# Patient Record
Sex: Female | Born: 1955 | Race: White | Hispanic: No | Marital: Married | Smoking: Never smoker
Health system: Southern US, Community
[De-identification: ages and names within clinical notes are randomized; demographics above are authoritative.]

## PROBLEM LIST (undated history)

## (undated) DIAGNOSIS — G4733 Obstructive sleep apnea (adult) (pediatric): Secondary | ICD-10-CM

## (undated) DIAGNOSIS — Z95810 Presence of automatic (implantable) cardiac defibrillator: Secondary | ICD-10-CM

## (undated) DIAGNOSIS — E785 Hyperlipidemia, unspecified: Secondary | ICD-10-CM

## (undated) DIAGNOSIS — I4729 Other ventricular tachycardia: Secondary | ICD-10-CM

## (undated) DIAGNOSIS — I509 Heart failure, unspecified: Secondary | ICD-10-CM

## (undated) DIAGNOSIS — T82198A Other mechanical complication of other cardiac electronic device, initial encounter: Secondary | ICD-10-CM

## (undated) DIAGNOSIS — N189 Chronic kidney disease, unspecified: Secondary | ICD-10-CM

## (undated) DIAGNOSIS — K219 Gastro-esophageal reflux disease without esophagitis: Secondary | ICD-10-CM

## (undated) DIAGNOSIS — G4700 Insomnia, unspecified: Secondary | ICD-10-CM

## (undated) DIAGNOSIS — I472 Ventricular tachycardia: Secondary | ICD-10-CM

## (undated) DIAGNOSIS — J189 Pneumonia, unspecified organism: Secondary | ICD-10-CM

## (undated) DIAGNOSIS — Z8619 Personal history of other infectious and parasitic diseases: Secondary | ICD-10-CM

## (undated) DIAGNOSIS — T8859XA Other complications of anesthesia, initial encounter: Secondary | ICD-10-CM

## (undated) DIAGNOSIS — IMO0001 Reserved for inherently not codable concepts without codable children: Secondary | ICD-10-CM

## (undated) DIAGNOSIS — Z950 Presence of cardiac pacemaker: Secondary | ICD-10-CM

## (undated) DIAGNOSIS — M109 Gout, unspecified: Secondary | ICD-10-CM

## (undated) DIAGNOSIS — Z8709 Personal history of other diseases of the respiratory system: Secondary | ICD-10-CM

## (undated) DIAGNOSIS — I428 Other cardiomyopathies: Secondary | ICD-10-CM

## (undated) DIAGNOSIS — C50919 Malignant neoplasm of unspecified site of unspecified female breast: Secondary | ICD-10-CM

## (undated) DIAGNOSIS — E669 Obesity, unspecified: Secondary | ICD-10-CM

## (undated) HISTORY — DX: Other mechanical complication of other cardiac electronic device, initial encounter: T82.198A

## (undated) HISTORY — DX: Heart failure, unspecified: I50.9

## (undated) HISTORY — DX: Obstructive sleep apnea (adult) (pediatric): G47.33

## (undated) HISTORY — DX: Other cardiomyopathies: I42.8

## (undated) HISTORY — PX: COLONOSCOPY: SHX174

## (undated) HISTORY — DX: Obesity, unspecified: E66.9

## (undated) HISTORY — PX: ABDOMINAL HYSTERECTOMY: SHX81

## (undated) HISTORY — DX: Other ventricular tachycardia: I47.29

## (undated) HISTORY — DX: Malignant neoplasm of unspecified site of unspecified female breast: C50.919

## (undated) HISTORY — DX: Presence of automatic (implantable) cardiac defibrillator: Z95.810

## (undated) HISTORY — DX: Ventricular tachycardia: I47.2

## (undated) HISTORY — PX: OTHER SURGICAL HISTORY: SHX169

---

## 1976-09-07 HISTORY — PX: APPENDECTOMY: SHX54

## 1996-09-07 DIAGNOSIS — C50919 Malignant neoplasm of unspecified site of unspecified female breast: Secondary | ICD-10-CM

## 1996-09-07 HISTORY — DX: Malignant neoplasm of unspecified site of unspecified female breast: C50.919

## 1996-09-07 HISTORY — PX: MASTECTOMY: SHX3

## 1996-09-07 HISTORY — PX: OTHER SURGICAL HISTORY: SHX169

## 2005-09-07 HISTORY — PX: OTHER SURGICAL HISTORY: SHX169

## 2005-10-22 ENCOUNTER — Ambulatory Visit: Payer: Self-pay | Admitting: Cardiology

## 2005-11-25 ENCOUNTER — Ambulatory Visit: Payer: Self-pay | Admitting: Cardiology

## 2005-11-30 ENCOUNTER — Ambulatory Visit: Payer: Self-pay | Admitting: Cardiology

## 2006-01-21 ENCOUNTER — Ambulatory Visit: Payer: Self-pay | Admitting: Cardiology

## 2006-03-04 ENCOUNTER — Ambulatory Visit: Payer: Self-pay | Admitting: Cardiology

## 2006-03-17 ENCOUNTER — Ambulatory Visit: Payer: Self-pay | Admitting: Cardiology

## 2006-04-07 ENCOUNTER — Ambulatory Visit: Payer: Self-pay | Admitting: Internal Medicine

## 2006-04-27 ENCOUNTER — Inpatient Hospital Stay (HOSPITAL_COMMUNITY): Admission: RE | Admit: 2006-04-27 | Discharge: 2006-04-29 | Payer: Self-pay | Admitting: Internal Medicine

## 2006-04-27 ENCOUNTER — Ambulatory Visit: Payer: Self-pay | Admitting: Internal Medicine

## 2006-05-17 ENCOUNTER — Ambulatory Visit: Payer: Self-pay | Admitting: Internal Medicine

## 2006-05-17 ENCOUNTER — Ambulatory Visit: Payer: Self-pay

## 2006-05-31 ENCOUNTER — Ambulatory Visit: Payer: Self-pay | Admitting: Cardiology

## 2006-08-17 ENCOUNTER — Ambulatory Visit: Payer: Self-pay | Admitting: Internal Medicine

## 2006-09-14 ENCOUNTER — Ambulatory Visit: Payer: Self-pay | Admitting: Cardiology

## 2006-11-16 ENCOUNTER — Ambulatory Visit: Payer: Self-pay | Admitting: Internal Medicine

## 2007-01-11 ENCOUNTER — Ambulatory Visit: Payer: Self-pay | Admitting: Cardiology

## 2007-02-11 ENCOUNTER — Ambulatory Visit: Payer: Self-pay | Admitting: Cardiology

## 2007-02-15 ENCOUNTER — Ambulatory Visit: Payer: Self-pay | Admitting: Internal Medicine

## 2007-02-17 ENCOUNTER — Ambulatory Visit: Payer: Self-pay | Admitting: Internal Medicine

## 2007-05-17 ENCOUNTER — Ambulatory Visit: Payer: Self-pay | Admitting: Internal Medicine

## 2007-06-16 ENCOUNTER — Ambulatory Visit: Payer: Self-pay | Admitting: Internal Medicine

## 2007-08-10 ENCOUNTER — Ambulatory Visit: Payer: Self-pay | Admitting: Internal Medicine

## 2007-10-14 ENCOUNTER — Ambulatory Visit: Payer: Self-pay | Admitting: Cardiology

## 2007-11-08 ENCOUNTER — Ambulatory Visit: Payer: Self-pay | Admitting: Internal Medicine

## 2008-02-13 ENCOUNTER — Ambulatory Visit: Payer: Self-pay | Admitting: Internal Medicine

## 2008-05-15 ENCOUNTER — Ambulatory Visit: Payer: Self-pay | Admitting: Internal Medicine

## 2008-08-14 ENCOUNTER — Ambulatory Visit: Payer: Self-pay | Admitting: Internal Medicine

## 2008-08-22 ENCOUNTER — Ambulatory Visit: Payer: Self-pay | Admitting: Cardiology

## 2008-08-27 ENCOUNTER — Ambulatory Visit: Payer: Self-pay | Admitting: Internal Medicine

## 2008-08-27 ENCOUNTER — Ambulatory Visit: Payer: Self-pay

## 2008-08-27 ENCOUNTER — Encounter: Payer: Self-pay | Admitting: Internal Medicine

## 2008-11-13 ENCOUNTER — Ambulatory Visit: Payer: Self-pay | Admitting: Internal Medicine

## 2008-11-16 ENCOUNTER — Encounter: Payer: Self-pay | Admitting: Internal Medicine

## 2008-12-21 ENCOUNTER — Telehealth: Payer: Self-pay | Admitting: Cardiology

## 2009-01-15 ENCOUNTER — Ambulatory Visit: Payer: Self-pay | Admitting: Internal Medicine

## 2009-01-15 DIAGNOSIS — I5022 Chronic systolic (congestive) heart failure: Secondary | ICD-10-CM | POA: Insufficient documentation

## 2009-01-15 DIAGNOSIS — R0602 Shortness of breath: Secondary | ICD-10-CM

## 2009-01-29 LAB — CONVERTED CEMR LAB
ALT: 17 units/L (ref 0–35)
AST: 23 units/L (ref 0–37)
Alkaline Phosphatase: 111 units/L (ref 39–117)
Basophils Absolute: 0 10*3/uL (ref 0.0–0.1)
Bilirubin, Direct: 0.1 mg/dL (ref 0.0–0.3)
CO2: 32 meq/L (ref 19–32)
Calcium: 9.3 mg/dL (ref 8.4–10.5)
Chloride: 107 meq/L (ref 96–112)
Direct LDL: 147.2 mg/dL
HDL: 31.4 mg/dL — ABNORMAL LOW (ref >39.00)
Lymphocytes Relative: 29.6 % (ref 12.0–46.0)
Monocytes Relative: 6.5 % (ref 3.0–12.0)
Neutrophils Relative %: 61.4 % (ref 43.0–77.0)
Platelets: 194 10*3/uL (ref 150.0–400.0)
Potassium: 4.3 meq/L (ref 3.5–5.1)
Pro B Natriuretic peptide (BNP): 234 pg/mL — ABNORMAL HIGH (ref 0.0–100.0)
RDW: 13 % (ref 11.5–14.6)
Sodium: 143 meq/L (ref 135–145)
TSH: 1.61 microintl units/mL (ref 0.35–5.50)
Total Protein: 7 g/dL (ref 6.0–8.3)
WBC: 6.2 10*3/uL (ref 4.5–10.5)

## 2009-02-01 ENCOUNTER — Ambulatory Visit: Payer: Self-pay | Admitting: Internal Medicine

## 2009-02-01 ENCOUNTER — Ambulatory Visit (HOSPITAL_COMMUNITY): Admission: RE | Admit: 2009-02-01 | Discharge: 2009-02-01 | Payer: Self-pay | Admitting: Internal Medicine

## 2009-02-05 ENCOUNTER — Ambulatory Visit: Payer: Self-pay | Admitting: Internal Medicine

## 2009-02-12 ENCOUNTER — Ambulatory Visit: Payer: Self-pay | Admitting: Internal Medicine

## 2009-02-20 ENCOUNTER — Encounter: Payer: Self-pay | Admitting: Internal Medicine

## 2009-02-26 ENCOUNTER — Telehealth (INDEPENDENT_AMBULATORY_CARE_PROVIDER_SITE_OTHER): Payer: Self-pay | Admitting: *Deleted

## 2009-03-26 ENCOUNTER — Encounter: Payer: Self-pay | Admitting: Cardiology

## 2009-04-02 ENCOUNTER — Encounter: Payer: Self-pay | Admitting: Internal Medicine

## 2009-04-02 ENCOUNTER — Ambulatory Visit: Payer: Self-pay | Admitting: Internal Medicine

## 2009-04-02 DIAGNOSIS — E663 Overweight: Secondary | ICD-10-CM | POA: Insufficient documentation

## 2009-04-08 ENCOUNTER — Encounter: Payer: Self-pay | Admitting: Internal Medicine

## 2009-04-10 ENCOUNTER — Ambulatory Visit: Payer: Self-pay

## 2009-04-10 ENCOUNTER — Encounter: Payer: Self-pay | Admitting: Internal Medicine

## 2009-04-12 ENCOUNTER — Telehealth: Payer: Self-pay | Admitting: Internal Medicine

## 2009-05-08 ENCOUNTER — Encounter: Payer: Self-pay | Admitting: Internal Medicine

## 2009-05-14 ENCOUNTER — Ambulatory Visit: Payer: Self-pay | Admitting: Internal Medicine

## 2009-05-30 ENCOUNTER — Encounter: Payer: Self-pay | Admitting: Internal Medicine

## 2009-07-08 ENCOUNTER — Telehealth: Payer: Self-pay | Admitting: Internal Medicine

## 2009-07-29 ENCOUNTER — Ambulatory Visit: Payer: Self-pay | Admitting: Internal Medicine

## 2009-07-29 ENCOUNTER — Encounter: Payer: Self-pay | Admitting: Internal Medicine

## 2009-07-30 ENCOUNTER — Encounter (INDEPENDENT_AMBULATORY_CARE_PROVIDER_SITE_OTHER): Payer: Self-pay | Admitting: *Deleted

## 2009-07-31 LAB — CONVERTED CEMR LAB
BUN: 12 mg/dL (ref 6–23)
Basophils Absolute: 0.1 10*3/uL (ref 0.0–0.1)
GFR calc non Af Amer: 79 mL/min (ref >60)
Hemoglobin: 12.9 g/dL (ref 12.0–15.0)
Lymphocytes Relative: 17.6 % (ref 12.0–46.0)
Monocytes Relative: 5.5 % (ref 3.0–12.0)
Platelets: 248 10*3/uL (ref 150.0–400.0)
Potassium: 3.5 meq/L (ref 3.5–5.1)
Pro B Natriuretic peptide (BNP): 47 pg/mL (ref 0.0–100.0)
Prothrombin Time: 10.9 s (ref 9.1–11.7)
RDW: 12.9 % (ref 11.5–14.6)
aPTT: 25.7 s (ref 21.7–28.8)

## 2009-08-03 ENCOUNTER — Encounter: Payer: Self-pay | Admitting: Internal Medicine

## 2009-08-06 ENCOUNTER — Telehealth: Payer: Self-pay | Admitting: Internal Medicine

## 2009-08-09 ENCOUNTER — Ambulatory Visit: Payer: Self-pay | Admitting: Internal Medicine

## 2009-08-09 ENCOUNTER — Inpatient Hospital Stay (HOSPITAL_BASED_OUTPATIENT_CLINIC_OR_DEPARTMENT_OTHER): Admission: RE | Admit: 2009-08-09 | Discharge: 2009-08-09 | Payer: Self-pay | Admitting: Internal Medicine

## 2009-08-13 ENCOUNTER — Ambulatory Visit: Payer: Self-pay | Admitting: Internal Medicine

## 2009-08-26 ENCOUNTER — Telehealth: Payer: Self-pay | Admitting: Internal Medicine

## 2009-08-29 ENCOUNTER — Encounter: Payer: Self-pay | Admitting: Internal Medicine

## 2009-09-20 ENCOUNTER — Ambulatory Visit: Payer: Self-pay | Admitting: Internal Medicine

## 2009-11-12 ENCOUNTER — Ambulatory Visit: Payer: Self-pay | Admitting: Internal Medicine

## 2009-11-19 ENCOUNTER — Encounter: Payer: Self-pay | Admitting: Internal Medicine

## 2009-12-12 ENCOUNTER — Encounter: Payer: Self-pay | Admitting: Internal Medicine

## 2010-01-22 ENCOUNTER — Ambulatory Visit: Payer: Self-pay

## 2010-01-22 ENCOUNTER — Ambulatory Visit: Payer: Self-pay | Admitting: Internal Medicine

## 2010-01-22 ENCOUNTER — Encounter: Payer: Self-pay | Admitting: Internal Medicine

## 2010-01-22 ENCOUNTER — Ambulatory Visit: Payer: Self-pay | Admitting: Cardiology

## 2010-01-22 ENCOUNTER — Ambulatory Visit (HOSPITAL_COMMUNITY): Admission: RE | Admit: 2010-01-22 | Discharge: 2010-01-22 | Payer: Self-pay | Admitting: Internal Medicine

## 2010-01-23 ENCOUNTER — Encounter: Payer: Self-pay | Admitting: Internal Medicine

## 2010-01-23 LAB — CONVERTED CEMR LAB
AST: 31 units/L (ref 0–37)
Albumin: 3.6 g/dL (ref 3.5–5.2)
Alkaline Phosphatase: 109 units/L (ref 39–117)
Calcium: 9.5 mg/dL (ref 8.4–10.5)
Cholesterol: 128 mg/dL (ref 0–200)
GFR calc non Af Amer: 75.58 mL/min (ref >60)
Sodium: 141 meq/L (ref 135–145)
Total Protein: 7.3 g/dL (ref 6.0–8.3)
Triglycerides: 141 mg/dL (ref 0.0–149.0)

## 2010-02-11 ENCOUNTER — Ambulatory Visit: Payer: Self-pay | Admitting: Internal Medicine

## 2010-02-25 ENCOUNTER — Telehealth: Payer: Self-pay | Admitting: Internal Medicine

## 2010-03-13 ENCOUNTER — Encounter: Payer: Self-pay | Admitting: Internal Medicine

## 2010-05-16 ENCOUNTER — Encounter: Payer: Self-pay | Admitting: Internal Medicine

## 2010-05-24 ENCOUNTER — Encounter: Payer: Self-pay | Admitting: Internal Medicine

## 2010-05-26 ENCOUNTER — Ambulatory Visit: Payer: Self-pay | Admitting: Internal Medicine

## 2010-06-09 ENCOUNTER — Telehealth: Payer: Self-pay | Admitting: Internal Medicine

## 2010-06-18 ENCOUNTER — Encounter: Payer: Self-pay | Admitting: Internal Medicine

## 2010-07-28 ENCOUNTER — Ambulatory Visit: Payer: Self-pay | Admitting: Internal Medicine

## 2010-08-12 ENCOUNTER — Ambulatory Visit: Payer: Self-pay | Admitting: Internal Medicine

## 2010-08-12 DIAGNOSIS — I959 Hypotension, unspecified: Secondary | ICD-10-CM

## 2010-10-07 NOTE — Progress Notes (Signed)
Summary: set up for lab work  Phone Note Call from Patient Call back at [facility] Phone [phone] Call back at [phone]   Caller: Patient Reason for Call: Talk to Nurse, Lab or Test Results Summary of Call: pt would like to be set up for lab work.  Initial call taken by: Neil Crouch,  June 09, 2010 10:27 AM  Follow-up for Phone Call        Left message to call back Kevan Rosebush, RN  June 09, 2010 5:56 PM   spoke w/pt she has been monitoring her blood sugar and it is staying around 120 and was 148 this am, these are done fasting when she gets up in the am, she ask if she should have blood work, advised she should f/u w/pcp she is agreeable Kevan Rosebush, RN  June 10, 2010 8:30 AM

## 2010-10-07 NOTE — Letter (Signed)
Summary: [place], [place] [address] [place]   [place], [place] [address]   Phone: [phone]  Fax: [phone]     Jan 23, 2010 MRN: [id number]   [place]  [address]   Dear Ms. Jech,  We have reviewed your cholesterol results.  They are as follows:     Total Cholesterol:    128 (Desirable: less than 200)       HDL  Cholesterol:     38.60  (Desirable: greater than 40 for men and 50 for women)       LDL Cholesterol:       61  (Desirable: less than 100 for low risk and less than 70 for moderate to high risk)       Triglycerides:       141.0  (Desirable: less than 150)  Our recommendations include:  Looks Good, all other labwork looked good as well, please continue current medications and feel free to give [place] a call if you have any questions.   Call our office at the number listed above if you have any questions.  Lowering your LDL cholesterol is important, but it is only one of a large number of "risk factors" that may indicate that you are at risk for heart disease, stroke or other complications of hardening of the arteries.  Other risk factors include:   A.  Cigarette Smoking* B.  High Blood Pressure* C.  Obesity* D.   Low HDL Cholesterol (see yours above)* E.   Diabetes Mellitus (higher risk if your is uncontrolled) F.  Family history of premature heart disease G.  Previous history of stroke or cardiovascular disease    *These are risk factors YOU HAVE CONTROL OVER.  For more information, visit .  There is now evidence that lowering the TOTAL CHOLESTEROL AND LDL CHOLESTEROL can reduce the risk of heart disease.  The American Heart Association recommends the following guidelines for the treatment of elevated cholesterol:  1.  If there is now current heart disease and less than two risk factors, TOTAL CHOLESTEROL should be less than 200 and LDL CHOLESTEROL should be less than 100. 2.  If there  is current heart disease or two or more risk factors, TOTAL CHOLESTEROL should be less than 200 and LDL CHOLESTEROL should be less than 70.  A diet low in cholesterol, saturated fat, and calories is the cornerstone of treatment for elevated cholesterol.  Cessation of smoking and exercise are also important in the management of elevated cholesterol and preventing vascular disease.  Studies have shown that 30 to 60 minutes of physical activity most days can help lower blood pressure, lower cholesterol, and keep your weight at a healthy level.  Drug therapy is used when cholesterol levels do not respond to therapeutic lifestyle changes (smoking cessation, diet, and exercise) and remains unacceptably high.  If medication is started, it is important to have you levels checked periodically to evaluate the need for further treatment options.  Thank you,    [facility]

## 2010-10-07 NOTE — Progress Notes (Signed)
Summary: lab results  Phone Note Call from Patient Call back at Home Phone [phone]   Caller: Patient Reason for Call: Talk to Nurse Summary of Call: request to have lab results faxed to her @ [phone] Initial call taken by: Darnell Level,  February 25, 2010 3:19 PM  Follow-up for Phone Call        labs faxed pt is aware Kevan Rosebush, RN  February 25, 2010 4:43 PM

## 2010-10-07 NOTE — Letter (Signed)
Summary: Remote Device Check  [facility], [place]  [id number] [place]   [place], [place] [address]   Phone: [phone]  Fax: [phone]     March 13, 2010 MRN: [id number]   [place]  [address]   Dear Ms. Janssens,   Your remote transmission was recieved and reviewed by your physician.  All diagnostics were within normal limits for you.  __X___Your next transmission is scheduled for: 05-15-2010.  Please transmit at any time this day.  If you have a wireless device your transmission will be sent automatically.    Sincerely,  Shelly Bombard

## 2010-10-07 NOTE — Cardiovascular Report (Signed)
Summary: Office Visit Remote   Office Visit Remote   Imported By: Sallee Provencal 11/20/2009 11:36:55  _____________________________________________________________________  External Attachment:    Type:   Image     Comment:   External Document

## 2010-10-07 NOTE — Assessment & Plan Note (Signed)
Summary: f80m   Visit Type:  Follow-up Primary Provider:  Laretta Bolster, MD  CC:  shortness of breath.  History of Present Illness: [name] is a 55 y/o woman with history of breast CA in 1998 treated with Adriamycin x 4 cycles. In 2007, diagnosed with CHF with EF 25-35%. She is s/p Medtronic BiV-ICD in 2008 with Sprint Fidelis lead. Never had cath. Nuke study in 2007 normal perfusion by report. Echo 5/11 with EF up to 50-55%. EF by cath 12/10 was 50%.  Had cath 12/10. Coronaries normal. Right atrial pressure mean of 7, RV pressure 28/6 . Pulmonary artery pressure was 23/9 with mean of 15.  Pulmonary capillary wedge pressure mean of 9. Fick cardiac output 3.9 L per minute.  Cardiac index 2.2 L per minute per meter squared.  Pulmonary vascular resistance was 1.5 Woods units.  Returns for routine follow-up. Doing quite well. Walking several days per week. Gets SOB with moderate  exertion. No orthopnea or PND or edema.  Blood sugars running up rcently with HgBA1c 6.2%.   Had f/u mammogram recently on the other breast and told she had 3 suspicious LNs. Waiting 6 months for f/u.    Current Medications (verified): 1)  Cozaar 50 Mg Tabs (Losartan Potassium) .... Take 1 Tablet By Mouth Once A Day 2)  Potassium Chloride Crys Cr 20 Meq Cr-Tabs (Potassium Chloride Crys Cr) .... Take 1 Tablet By Mouth Once A Day 3)  Aldactone 25 Mg Tabs (Spironolactone) .... Take 1 Tablet By Mouth Once A Day 4)  Furosemide 40 Mg Tabs (Furosemide) .... Take 1 1/2 Tablet in Am 1 Tablet in Pm 5)  Prilosec 20 Mg Cpdr (Omeprazole) .... As Needed 6)  Azo-Cranberry 450 Mg Tabs (Cranberry) .... As Needed 7)  Multivitamins   Tabs (Multiple Vitamin) .... Daily 8)  Carvedilol 12.5 Mg Tabs (Carvedilol) .... Take One Tablet By Mouth Twice A Day 9)  Citalopram Hydrobromide 40 Mg Tabs (Citalopram Hydrobromide) .... Daily 10)  Zolpidem Tartrate 10 Mg Tabs (Zolpidem Tartrate) .... Daily 11)  Digoxin 0.125 Mg Tabs (Digoxin) .... Take  1 Tablet By Mouth Once A Day 12)  Crestor 20 Mg Tabs (Rosuvastatin Calcium) .... Take One Tablet By Mouth Daily.  Allergies (verified): No Known Drug Allergies  Past History:  Past Medical History: Last updated: 09/20/2009  1. 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 Coronaries normal. LVEF 50%       --Right atrial pressure mean of 7, RV pressure 28/6 . Pulmonary artery pressure was 23/9 with mean of 15.  Pulmonary capillary wedge pressure mean of 9. Fick cardiac output 3.9 L per minute.  Cardiac index 2.2 L per minute per meter squared.  Pulmonary vascular resistance was 1.5 Woods units.       --s/p BiVICD with Sprint Fidelis lead      --CPX 5/10: VO2 13.9 (corrected for body weight 21.9)  2. Breast Ca      --s/p mastectomy and chemo with adriamycin (1998)  3. Obesity 4. NSVT  Review of Systems       As per HPI and past medical history; otherwise all systems negative.   Vital Signs:  Patient profile:   55 year old female Height:      61 inches Weight:      184 pounds BMI:     34.89 Pulse rate:   73 / minute BP sitting:   106 / 62  (  left arm) Cuff size:   regular  Vitals Entered By: Mignon Pine, RMA (July 28, 2010 2:55 PM)  Physical Exam  General:  The patient was alert and oriented in no acute distress. HEENT Normal.  Neck veins were flat, carotids were brisk.  Lungs were clear.  Heart sounds were regular without murmurs or gallops.  Abdomen was soft with active bowel sounds. There is no clubbing cyanosis or edema. Skin Warm and dry     ICD Specifications Following MD:  Virl Axe, MD     ICD Vendor:  Medtronic     ICD Model Number:  [id number]     ICD Serial Number:  [id number] ICD DOI:  04/27/2006     ICD Implanting MD:  Virl Axe, MD  Lead 1:    Location: RA     DOI: 04/27/2006     Model #: [id number]     Serial #: [id number]     Status: active Lead 2:    Location: RV     DOI: 04/27/2006      Model #: [id number]     Serial #: [id number]     Status: active Lead 3:    Location: LV     DOI: 04/27/2006     Model #: [id number]     Serial #: [id number]     Status: active  Indications::  NICM, CHF   ICD Follow Up ICD Dependent:  No      Episodes Coumadin:  No  Brady Parameters Mode DDDR     Lower Rate Limit:  60     Upper Rate Limit 130 PAV 120     Sensed AV Delay:  80  Tachy Zones VF:  200     VT:  176     Impression & Recommendations:  Problem # 1:  SYSTOLIC HEART FAILURE, CHRONIC (ICD-428.22) Doing very well NYHA Class II with recovered EF. Continue current regimen. Asked her to f/u with [facility]. If she has recurrent disease we will need to coordinate her care to try and reduce risk of recurrent cardiomyopathy   Problem # 2:  Glucose intolerance Continue exercise and weight loss program. Also discussed use of metformin to prevent progression to DM2 (as per [facility] trial). Will f/u with PCP.   Other Orders: EKG w/ Interpretation (93000)  Patient Instructions: 1)  Your physician has requested that you have an echocardiogram.  Echocardiography is a painless test that uses sound waves to create images of your heart. It provides your doctor with information about the size and shape of your heart and how well your heart's chambers and valves are working.  This procedure takes approximately one hour. There are no restrictions for this procedure.  NEEDS IN 6 MONTHS. 2)  Your physician wants you to follow-up in: 6 MONTHS.  You will receive a reminder letter in the mail two months in advance. If you don't receive a letter, please call our office to schedule the follow-up appointment.  Appended Document: refills    Clinical Lists Changes  Medications: Changed medication from COZAAR 50 MG TABS (LOSARTAN POTASSIUM) Take 1 tablet by mouth once a day to LOSARTAN POTASSIUM 50 MG TABS (LOSARTAN POTASSIUM) Take 1 tablet by mouth once a day - Signed Rx of POTASSIUM CHLORIDE CRYS CR 20 MEQ  CR-TABS (POTASSIUM CHLORIDE CRYS CR) Take 1 tablet by mouth once a day;  #90 x 3;  Signed;  Entered by: Kevan Rosebush, RN;  Authorized by: Jolaine Artist, MD, [facility];  Method used: Faxed to [facility], , , [place]  , Ph: [phone], Fax: [phone] Rx of ALDACTONE 25 MG TABS (SPIRONOLACTONE) Take 1 tablet by mouth once a day;  #90 x 3;  Signed;  Entered by: Kevan Rosebush, RN;  Authorized by: Jolaine Artist, MD, [facility];  Method used: Faxed to [facility], , , [place]  , Ph: [phone], Fax: [phone] Rx of FUROSEMIDE 40 MG TABS (FUROSEMIDE) take 1 1/2 tablet in AM 1 tablet in PM;  #225 x 3;  Signed;  Entered by: Kevan Rosebush, RN;  Authorized by: Jolaine Artist, MD, [facility];  Method used: Faxed to [facility], , , [place]  , Ph: [phone], Fax: [phone] Rx of CARVEDILOL 12.5 MG TABS (CARVEDILOL) Take one tablet by mouth twice a day;  #180 x 3;  Signed;  Entered by: Kevan Rosebush, RN;  Authorized by: Jolaine Artist, MD, [facility];  Method used: Faxed to [facility], , , [place]  , Ph: [phone], Fax: [phone] Rx of DIGOXIN 0.125 MG TABS (DIGOXIN) Take 1 tablet by mouth once a day;  #90 x 3;  Signed;  Entered by: Kevan Rosebush, RN;  Authorized by: Jolaine Artist, MD, [facility];  Method used: Faxed to [facility], , , [place]  , Ph: [phone], Fax: [phone] Rx of CRESTOR 20 MG TABS (ROSUVASTATIN CALCIUM) Take one tablet by mouth daily.;  #90 x 3;  Signed;  Entered by: Kevan Rosebush, RN;  Authorized by: Jolaine Artist, MD, [facility];  Method used: Faxed to [facility], , , [place]  , Ph: [phone], Fax: [phone] Rx of LOSARTAN POTASSIUM 50 MG TABS (LOSARTAN POTASSIUM) Take 1 tablet by mouth once a day;  #90 x 3;  Signed;  Entered by: Kevan Rosebush, RN;  Authorized by: Jolaine Artist, MD, [facility];  Method used: Faxed to [facility], , , [place]  , Ph: [phone], Fax: [phone]    Prescriptions: LOSARTAN POTASSIUM 50 MG TABS (LOSARTAN POTASSIUM) Take 1 tablet  by mouth once a day  #90 x 3   Entered by:   Kevan Rosebush, RN   Authorized by:   Jolaine Artist, MD, [facility]   Signed by:   Kevan Rosebush, RN on 07/28/2010   Method used:   Faxed to ...       [facility] (mail-order)             , [place]         Ph: [phone]       Fax: [phone]   RxID:   [id number] CRESTOR 20 MG TABS (ROSUVASTATIN CALCIUM) Take one tablet by mouth daily.  #90 x 3   Entered by:   Kevan Rosebush, RN   Authorized by:   Jolaine Artist, MD, [facility]   Signed by:   Kevan Rosebush, RN on 07/28/2010   Method used:   Faxed to ...       [facility] (mail-order)             , [place]         Ph: [phone]       Fax: [phone]   RxID:   [id number] DIGOXIN 0.125 MG TABS (DIGOXIN) Take 1 tablet by mouth once a day  #90 x 3   Entered by:   Kevan Rosebush, RN   Authorized by:   Jolaine Artist, MD, [facility]   Signed by:   Kevan Rosebush, RN on 07/28/2010   Method used:  Faxed to ...       [facility] (mail-order)             , [place]         Ph: [phone]       Fax: [phone]   RxID:   [id number] CARVEDILOL 12.5 MG TABS (CARVEDILOL) Take one tablet by mouth twice a day  #180 x 3   Entered by:   Kevan Rosebush, RN   Authorized by:   Jolaine Artist, MD, [facility]   Signed by:   Kevan Rosebush, RN on 07/28/2010   Method used:   Faxed to ...       [facility] (mail-order)             , [place]         Ph: [phone]       Fax: [phone]   RxID:   [id number] FUROSEMIDE 40 MG TABS (FUROSEMIDE) take 1 1/2 tablet in AM 1 tablet in PM  #225 x 3   Entered by:   Kevan Rosebush, RN   Authorized by:   Jolaine Artist, MD, [facility]   Signed by:   Kevan Rosebush, RN on 07/28/2010   Method used:   Faxed to ...       [facility] (mail-order)             , [place]         Ph: [phone]       Fax: [phone]   RxID:   [id number] ALDACTONE 25 MG TABS (SPIRONOLACTONE) Take 1 tablet by mouth once a day  #90 x 3   Entered  by:   Kevan Rosebush, RN   Authorized by:   Jolaine Artist, MD, [facility]   Signed by:   Kevan Rosebush, RN on 07/28/2010   Method used:   Faxed to ...       [facility] (mail-order)             , [place]         Ph: [phone]       Fax: [phone]   RxID:   [id number] POTASSIUM CHLORIDE CRYS CR 20 MEQ CR-TABS (POTASSIUM CHLORIDE CRYS CR) Take 1 tablet by mouth once a day  #90 x 3   Entered by:   Kevan Rosebush, RN   Authorized by:   Jolaine Artist, MD, [facility]   Signed by:   Kevan Rosebush, RN on 07/28/2010   Method used:   Faxed to ...       [facility] (mail-order)             , [place]         Ph: [phone]       Fax: [phone]   RxID:   [id number]

## 2010-10-07 NOTE — Letter (Signed)
Summary: Remote Device Check  [facility], [place]  [id number] [place]   [place],  [address]   Phone: [phone]  Fax: [phone]     June 18, 2010 MRN: [id number]   [place]  [address]   Dear Ms. Calzada,   Your remote transmission was recieved and reviewed by your physician.  All diagnostics were within normal limits for you.   __X____Your next office visit is scheduled for:  08-12-10 @ 310pm with Dr Caryl Comes.                                 Sincerely,  Shelly Bombard

## 2010-10-07 NOTE — Assessment & Plan Note (Signed)
Summary: 4 MONTH ROV/SL   Visit Type:  Follow-up Primary Provider:  Laretta Bolster, MD  CC:  shortness of breath (mild).  History of Present Illness: [name] is a 55 y/o woman with history of breast CA in 1998 treated with Adriamycin x 4 cycles. In 2007, diagnosed with CHF with EF 25-35%. She is s/p Medtronic BiV-ICD in 2008 with Sprint Fidelis lead. Never had cath. Nuke study in 2007 normal perfusion by report. Recent echo with EF up to 40-45%. EF by cath 12/10 was 50%.  Had cath 12/10. Coronaries normal. Right atrial pressure mean of 7, RV pressure 28/6 . Pulmonary artery pressure was 23/9 with mean of 15.  Pulmonary capillary wedge pressure mean of 9. Fick cardiac output 3.9 L per minute.  Cardiac index 2.2 L per minute per meter squared.  Pulmonary vascular resistance was 1.5 Woods units.  Returns for routine follow-up. Doing quite well. Walking about 15-20 mins 5 days per week. Does get SOB at times but rare. No orthopnea or PND or edema.    I reviewed echo today EF 50-55% mild MR.  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 Take 1 Tablet By Mouth Once A Day 2)  Potassium Chloride Crys Cr 20 Meq Cr-Tabs (Potassium Chloride Crys Cr) .... Take 1 Tablet By Mouth Once A Day 3)  Aldactone 25 Mg Tabs (Spironolactone) .... Take 1 Tablet By Mouth Once A Day 4)  Furosemide 40 Mg Tabs (Furosemide) .... Take 1 1/2 Tablet in Am 1 Tablet in Pm 5)  Prilosec 20 Mg Cpdr (Omeprazole) .... As Needed 6)  Azo-Cranberry 450 Mg Tabs (Cranberry) .... As Needed 7)  Multivitamins   Tabs (Multiple Vitamin) .... Daily 8)  Carvedilol 12.5 Mg Tabs  (Carvedilol) .... Take One Tablet By Mouth Twice A Day 9)  Citalopram Hydrobromide 40 Mg Tabs (Citalopram Hydrobromide) .... Daily 10)  Zolpidem Tartrate 10 Mg Tabs (Zolpidem Tartrate) .... Daily 11)  Digoxin 0.125 Mg Tabs (Digoxin) .... Take 1 Tablet By Mouth Once A Day 12)  Crestor 20 Mg Tabs (Rosuvastatin Calcium) .... Take One Tablet By Mouth Daily.  Current Medications (verified): 1)  Cozaar 50 Mg Tabs (Losartan Potassium) .... Take 1 Tablet By Mouth Once A Day 2)  Potassium Chloride Crys Cr 20 Meq Cr-Tabs (Potassium Chloride Crys Cr) .... Take 1 Tablet By Mouth Once A Day 3)  Aldactone 25 Mg Tabs (Spironolactone) .... Take 1 Tablet By Mouth Once A Day 4)  Furosemide 40 Mg Tabs (Furosemide) .... Take 1 1/2 Tablet in Am 1 Tablet in Pm 5)  Prilosec 20 Mg Cpdr (Omeprazole) .... As Needed 6)  Azo-Cranberry 450 Mg Tabs (Cranberry) .... As Needed 7)  Multivitamins   Tabs (Multiple Vitamin) .... Daily 8)  Carvedilol 12.5 Mg Tabs (Carvedilol) .... Take One Tablet By Mouth Twice A Day 9)  Citalopram Hydrobromide 40 Mg Tabs (Citalopram Hydrobromide) .... Daily 10)  Zolpidem Tartrate 10 Mg Tabs (Zolpidem Tartrate) .... Daily 11)  Digoxin 0.125 Mg Tabs (Digoxin) .... Take 1 Tablet By Mouth Once A Day 12)  Crestor 20 Mg Tabs (Rosuvastatin Calcium) .... Take One Tablet By Mouth Daily.  Allergies (verified): No Known Drug Allergies  Review of Systems       As per HPI and past medical history; otherwise all systems negative.   Vital Signs:  Patient profile:   55 year old female Height:      61 inches Weight:      183 pounds BMI:     34.70 Pulse rate:   69 / minute BP sitting:   112 / 68  (left arm) Cuff size:   regular  Vitals Entered By: Mignon Pine, RMA (Jan 22, 2010 9:42 AM)  Physical Exam  General:  The patient was alert and oriented in no acute distress. HEENT Normal.  Neck veins were flat, carotids were brisk.  Lungs were clear.  Heart sounds were regular without murmurs or  gallops.  Abdomen was soft with active bowel sounds. There is no clubbing cyanosis or edema. Skin Warm and dry     ICD Specifications Following MD:  Virl Axe, MD     ICD Vendor:  Medtronic     ICD Model Number:  [id number]     ICD Serial Number:  [id number] ICD DOI:  04/27/2006     ICD Implanting MD:  Virl Axe, MD  Lead 1:    Location: RA     DOI: 04/27/2006     Model #: [id number]     Serial #: [id number]     Status: active Lead 2:    Location: RV     DOI: 04/27/2006     Model #: [id number]     Serial #: [id number]     Status: active Lead 3:    Location: LV     DOI: 04/27/2006     Model #: [id number]     Serial #: [id number]     Status: active  Indications::  NICM, CHF   ICD Follow Up ICD Dependent:  No      Episodes Coumadin:  No  Brady Parameters Mode DDDR     Lower Rate Limit:  60     Upper Rate Limit 130 PAV 120     Sensed AV Delay:  80  Tachy Zones VF:  200     VT:  176     Impression & Recommendations:  Problem # 1:  SYSTOLIC HEART FAILURE, CHRONIC (ICD-428.22) Much improved EF now normalized. NYHA Class I.  Volume status looks good. Continue current regimen. At next visit increase Cozaar to 100 as BP tolerates.  Problem # 2:  Hyperlipidemia Check lipids today.  Other Orders: EKG w/ Interpretation (93000) TLB-BMP (Basic Metabolic Panel-BMET) (99991111) TLB-BNP (B-Natriuretic Peptide) (83880-BNPR) TLB-Lipid Panel (80061-LIPID) TLB-Hepatic/Liver Function Pnl (80076-HEPATIC)  Patient Instructions: 1)  Labs today 2)  Follow up in 6 months

## 2010-10-07 NOTE — Letter (Signed)
Summary: Device-Delinquent Phone Proofreader, [place]  [address] [place]   [place],  [address]   Phone: [phone]  Fax: [phone]     May 16, 2010 MRN: [id number]   [place]  [address]   Dear Ms. Gesner,  According to our records, you were scheduled for a device phone transmission on 05-15-2010.     We did not receive any results from this check.  If you transmitted on your scheduled day, please call us to help troubleshoot your system.  If you forgot to send your transmission, please send one upon receipt of this letter.  Thank you,   [facility]

## 2010-10-07 NOTE — Letter (Signed)
Summary: Remote Device Check  [facility], [place]  [id number] [place]   [place], [place] [address]   Phone: [phone]  Fax: [phone]     November 19, 2009 MRN: [id number]   [place]  [address]   Dear Ms. Bostick,   Your remote transmission was recieved and reviewed by your physician.  All diagnostics were within normal limits for you.  __X___Your next transmission is scheduled for:  February 11, 2010.  Please transmit at any time this day.  If you have a wireless device your transmission will be sent automatically.     Sincerely,  Hotel manager

## 2010-10-07 NOTE — Medication Information (Signed)
Summary: Prescription Refill Request  Prescription Refill Request   Imported By: Sallee Provencal 01/02/2010 15:17:50  _____________________________________________________________________  External Attachment:    Type:   Image     Comment:   External Document

## 2010-10-07 NOTE — Cardiovascular Report (Signed)
Summary: Office Visit Remote   Office Visit Remote   Imported By: Sallee Provencal 03/18/2010 13:31:00  _____________________________________________________________________  External Attachment:    Type:   Image     Comment:   External Document

## 2010-10-07 NOTE — Assessment & Plan Note (Signed)
Summary: appt @ 9:45/eph   Primary Provider:  Laretta Bolster, MD  CC:  SOB.  History of Present Illness: [name] is a 55 y/o woman with history of breast CA in 1998 treated with Adriamycin x 4 cycles. In 2007, diagnosed with CHF with EF 25-35%. She is s/p Medtronic BiV-ICD in 2008 with Sprint Fidelis lead. Never had cath. Nuke study in 2007 normal perfusion by report. Recent echo with EF up to 40-45%. EF by cath 12/10 was 50%.  Had cath 12/10. Coronaries normal. Right atrial pressure mean of 7, RV pressure 28/6 . Pulmonary artery pressure was 23/9 with mean of 15.  Pulmonary capillary wedge pressure mean of 9. Fick cardiac output 3.9 L per minute.  Cardiac index 2.2 L per minute per meter squared.  Pulmonary vascular resistance was 1.5 Woods units.  Returns for routine follow-up. Doing quite well. Walking up and down steps for exercise programs. Does get SOB at times but rare. No orthopnea or PND or edema.    Current Medications (verified): 1)  Cozaar 50 Mg Tabs (Losartan Potassium) .... Take 1 Tablet By Mouth Once A Day 2)  Potassium Chloride Crys Cr 20 Meq Cr-Tabs (Potassium Chloride Crys Cr) .... Take 1 Tablet By Mouth Once A Day 3)  Aldactone 25 Mg Tabs (Spironolactone) .... Take 1 Tablet By Mouth Once A Day 4)  Furosemide 40 Mg Tabs (Furosemide) .... Take 1 1/2 Tablet in Am 1 Tablet in Pm 5)  Prilosec 20 Mg Cpdr (Omeprazole) .... As Needed 6)  Azo-Cranberry 450 Mg Tabs (Cranberry) .... As Needed 7)  Multivitamins   Tabs (Multiple Vitamin) .... Daily 8)  Carvedilol 6.25 Mg Tabs (Carvedilol) .... Take 1 1/2 Tablet By Mouth Twice A Day 9)  Citalopram Hydrobromide 40 Mg Tabs (Citalopram Hydrobromide) .... Daily 10)  Zolpidem Tartrate 10 Mg Tabs (Zolpidem Tartrate) .... Daily 11)  Digoxin 0.125 Mg Tabs (Digoxin) .... Take 1 Tablet By Mouth Once A Day 12)  Crestor 20 Mg Tabs (Rosuvastatin Calcium) .... Take One Tablet By Mouth Daily.  Allergies (verified): No Known Drug Allergies  Past  History:  Past Medical History:  1. 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 Coronaries normal. LVEF 50%       --Right atrial pressure mean of 7, RV pressure 28/6 . Pulmonary artery pressure was 23/9 with mean of 15.  Pulmonary capillary wedge pressure mean of 9. Fick cardiac output 3.9 L per minute.  Cardiac index 2.2 L per minute per meter squared.  Pulmonary vascular resistance was 1.5 Woods units.       --s/p BiVICD with Sprint Fidelis lead      --CPX 5/10: VO2 13.9 (corrected for body weight 21.9)  2. Breast Ca      --s/p mastectomy and chemo with adriamycin (1998)  3. Obesity 4. NSVT  Review of Systems       As per HPI and past medical history; otherwise all systems negative.   Vital Signs:  Patient profile:   55 year old female Height:      61 inches Weight:      182 pounds BMI:     34.51 Pulse rate:   75 / minute BP sitting:   107 / 60  (right arm) Cuff size:   regular  Vitals Entered By: Mignon Pine, RMA (September 20, 2009 9:23 AM)  Physical Exam  General:  The patient was alert and oriented in no  acute distress. HEENT Normal.  Neck veins were flat, carotids were brisk.  Lungs were clear.  Heart sounds were regular without murmurs or gallops.  Abdomen was soft with active bowel sounds. There is no clubbing cyanosis or edema. Skin Warm and dry     ICD Specifications Following MD:  Virl Axe, MD     ICD Vendor:  Medtronic     ICD Model Number:  [phone]     ICD Serial Number:  [id number] ICD DOI:  04/27/2006     ICD Implanting MD:  Virl Axe, MD  Lead 1:    Location: RA     DOI: 04/27/2006     Model #: [id number]     Serial #: [id number]     Status: active Lead 2:    Location: RV     DOI: 04/27/2006     Model #: [id number]     Serial #: [id number]     Status: active Lead 3:    Location: LV     DOI: 04/27/2006     Model #: [id number]     Serial #: [id number]     Status: active  Indications::  NICM, CHF   ICD  Follow Up ICD Dependent:  No      Episodes Coumadin:  No  Brady Parameters Mode DDDR     Lower Rate Limit:  60     Upper Rate Limit 130 PAV 120     Sensed AV Delay:  80  Tachy Zones VF:  200     VT:  176     Impression & Recommendations:  Problem # 1:  SYSTOLIC HEART FAILURE, CHRONIC (ICD-428.22) Much improved EF now normalized. NYHA Class II. Will titrate carvedilol to 12.5 two times a day and leave it there. Have encouraged her to participate in routine exercise progra. F/u in 4 months with repeat echo.  Other Orders: Echocardiogram (Echo)  Patient Instructions: 1)  Your physician has requested that you have an echocardiogram.  Echocardiography is a painless test that uses sound waves to create images of your heart. It provides your doctor with information about the size and shape of your heart and how well your heart's chambers and valves are working.  This procedure takes approximately one hour. There are no restrictions for this procedure.  In 4 months 2)  Follow up in 4 months Prescriptions: CARVEDILOL 12.5 MG TABS (CARVEDILOL) Take one tablet by mouth twice a day  #180 x 3   Entered by:   Kevan Rosebush, RN   Authorized by:   Jolaine Artist, MD, [facility]   Signed by:   Kevan Rosebush, RN on 09/20/2009   Method used:   Print then Give to Patient   RxID:   [id number] CRESTOR 20 MG TABS (ROSUVASTATIN CALCIUM) Take one tablet by mouth daily.  #90 x 3   Entered by:   Kevan Rosebush, RN   Authorized by:   Jolaine Artist, MD, [facility]   Signed by:   Kevan Rosebush, RN on 09/20/2009   Method used:   Print then Give to Patient   RxID:   [id number] DIGOXIN 0.125 MG TABS (DIGOXIN) Take 1 tablet by mouth once a day  #90 x 3   Entered by:   Kevan Rosebush, RN   Authorized by:   Jolaine Artist, MD, [facility]   Signed by:   Kevan Rosebush, RN on 09/20/2009   Method used:   Print then Give to Patient   RxID:   [id number]  FUROSEMIDE 40 MG TABS (FUROSEMIDE) take 1 1/2 tablet  in AM 1 tablet in PM  #220 x 3   Entered by:   Kevan Rosebush, RN   Authorized by:   Jolaine Artist, MD, [facility]   Signed by:   Kevan Rosebush, RN on 09/20/2009   Method used:   Print then Give to Patient   RxID:   [id number] ALDACTONE 25 MG TABS (SPIRONOLACTONE) Take 1 tablet by mouth once a day  #90 x 3   Entered by:   Kevan Rosebush, RN   Authorized by:   Jolaine Artist, MD, [facility]   Signed by:   Kevan Rosebush, RN on 09/20/2009   Method used:   Print then Give to Patient   RxID:   [id number] POTASSIUM CHLORIDE CRYS CR 20 MEQ CR-TABS (POTASSIUM CHLORIDE CRYS CR) Take 1 tablet by mouth once a day  #90 x 3   Entered by:   Kevan Rosebush, RN   Authorized by:   Jolaine Artist, MD, [facility]   Signed by:   Kevan Rosebush, RN on 09/20/2009   Method used:   Print then Give to Patient   RxID:   [id number] COZAAR 50 MG TABS (LOSARTAN POTASSIUM) Take 1 tablet by mouth once a day  #90 x 3   Entered by:   Kevan Rosebush, RN   Authorized by:   Jolaine Artist, MD, [facility]   Signed by:   Kevan Rosebush, RN on 09/20/2009   Method used:   Print then Give to Patient   RxID:   [id number]

## 2010-10-07 NOTE — Cardiovascular Report (Signed)
Summary: Office Visit Remote   Office Visit Remote   Imported By: Sallee Provencal 06/19/2010 11:06:01  _____________________________________________________________________  External Attachment:    Type:   Image     Comment:   External Document

## 2010-10-07 NOTE — Assessment & Plan Note (Signed)
Summary: defib/saf   Primary Provider:  Laretta Bolster, MD   History of Present Illness: [name] is seen today in follow-up for congestive heart failure in the setting of nonischemic cardiomyopathy.  She is status post CRTD implantation about 3years ago.She has a sprinterFidelis lead The patient denies chest pain, edema or palpitations.  She does have some shortness of breath which is chronic and stable. There is also concomitant edema which is stable.  She has a sensation of "wierdness" which is hard to clarify but may be some LH  She underwent catheterization 12/10 which I reviewed with Dr. Reine Just; coronaries were normal filling pressures were normal cardiac index is over 2. Laboratories were reviewed with a BNP of 47 potassium of 3.5 otherwise were normal     . Nuke study in 2007 normal perfusion by report. Echo 5/11 with EF up to 50-55%. EF by cath 12/10 was 50%.      Current Medications (verified): 1)  Losartan Potassium 50 Mg Tabs (Losartan Potassium) .... Take 1 Tablet By Mouth Once A Day 2)  Potassium Chloride Crys Cr 20 Meq Cr-Tabs (Potassium Chloride Crys Cr) .... Take 1 Tablet By Mouth Once A Day 3)  Aldactone 25 Mg Tabs (Spironolactone) .... Take 1 Tablet By Mouth Once A Day 4)  Furosemide 40 Mg Tabs (Furosemide) .... Take 1 1/2 Tablet in Am 1 Tablet in Pm 5)  Prilosec 20 Mg Cpdr (Omeprazole) .... As Needed 6)  Azo-Cranberry 450 Mg Tabs (Cranberry) .... As Needed 7)  Multivitamins   Tabs (Multiple Vitamin) .... Daily 8)  Carvedilol 12.5 Mg Tabs (Carvedilol) .... Take One Tablet By Mouth Twice A Day 9)  Citalopram Hydrobromide 40 Mg Tabs (Citalopram Hydrobromide) .... Daily 10)  Zolpidem Tartrate 10 Mg Tabs (Zolpidem Tartrate) .... Daily 11)  Digoxin 0.125 Mg Tabs (Digoxin) .... Take 1 Tablet By Mouth Once A Day 12)  Simvastatin 40 Mg Tabs (Simvastatin) .... Take One Tablet Once Daily  Allergies (verified): No Known Drug Allergies  Vital Signs:  Patient  profile:   55 year old female Height:      61 inches Weight:      180 pounds Pulse (ortho):   68 / minute Pulse rhythm:   regular BP supine:   98 / 67  (right arm)  Physical Exam  General:  The patient was alert and oriented in no acute distress. HEENT Normal.  Neck veins were flat, carotids were brisk.  Lungs were clear.  Heart sounds were regular without murmurs or gallops.  Abdomen was soft with active bowel sounds. There is no clubbing cyanosis or edema. Skin Warm and dry repeat BP was 82 sys    ICD Specifications Following MD:  Virl Axe, MD     ICD Vendor:  Medtronic     ICD Model Number:  [id number]     ICD Serial Number:  [id number] ICD DOI:  04/27/2006     ICD Implanting MD:  Virl Axe, MD  Lead 1:    Location: RA     DOI: 04/27/2006     Model #: [id number]     Serial #: [id number]     Status: active Lead 2:    Location: RV     DOI: 04/27/2006     Model #: [id number]     Serial #: [id number]     Status: active Lead 3:    Location: LV     DOI: 04/27/2006     Model #: [id number]     Serial #:  [id number]     Status: active  Indications::  NICM, CHF   ICD Follow Up Battery Voltage:  2.92 V     Charge Time:  9.6 seconds     Underlying rhythm:  SR ICD Dependent:  No       ICD Device Measurements Atrium:  Amplitude: 2.7 mV, Impedance: 368 ohms, Threshold: 0.5 V at 0.4 msec Right Ventricle:  Amplitude: 12.3 mV, Impedance: 768 ohms, Threshold: 1.0 V at 0.4 msec Left Ventricle:  Impedance: 600 ohms, Threshold: 1.5 V at 0.4 msec  Episodes MS Episodes:  0     Coumadin:  No Shock:  0     ATP:  0     Nonsustained:  9     Atrial Pacing:  8.8%     Ventricular Pacing:  99.9%  Brady Parameters Mode DDDR     Lower Rate Limit:  60     Upper Rate Limit 130 PAV 120     Sensed AV Delay:  80  Tachy Zones VF:  200     VT:  176     Next Remote Date:  11/13/2010     Next Cardiology Appt Due:  08/10/2011 Tech Comments:  9 MONITORED VT EPISODES--LONGEST WAS 4 SECONDS.  NORMAL DEVICE FUNCTION.  [id number]  LEAD STABLE. SIC 0. OPTIVOL STABLE.  VERIFIED PT HAD LETTER/MAGNET.  DEMONSTRATED TONES FOR PT.  CHANGED RV OUTPUT FROM 2.0 TO 2.5 AND TURNED ON 1:1 SVT.  CARELINK 11-13-10 AND ROV IN 12 MTHS W/SK. Shelly Bombard  August 12, 2010 3:17 PM  Impression & Recommendations:  Problem # 1:  HYPOTENSION (ICD-458.9) There is no clear trigger for this. However, her carvedilol dose in half tonight resume normal dosing tomorrow all of her medications except her for a left total toe cut in half. She is to call Dr. Reine Just and hHeather next week with blood pressure recordings.  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 Take 1 tablet by mouth once a day    Aldactone 25 Mg Tabs (Spironolactone) .Marland Kitchen... Take 1/2  tablet by mouth once a day    Furosemide 40 Mg Tabs (Furosemide) .Marland Kitchen... Take 1 1/2 tablet in am 1 tablet in pm    Carvedilol 12.5 Mg Tabs (Carvedilol) .Marland Kitchen... Take one tablet by mouth twice a day    Digoxin 0.125 Mg Tabs (Digoxin) .Marland Kitchen... 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 Take one tablet by mouth twice a day  Her updated medication list for this problem includes:    Carvedilol 12.5 Mg Tabs (Carvedilol) .Marland Kitchen... Take one tablet by mouth twice a day  Patient Instructions: 1)  Your physician has recommended you make the following change in your medication: Decrease Aldactone to 1/2 pill daily.  Tonight take 1/2 pill of Carvedilol and then resume normal schedule tomorrow.  2)  Your physician wants you to follow-up in: 1  year  You will receive a reminder letter in the mail two months in advance. If you don't receive a letter,  please call our office to schedule the follow-up appointment.

## 2010-11-13 ENCOUNTER — Encounter: Payer: Self-pay | Admitting: Internal Medicine

## 2010-11-13 ENCOUNTER — Encounter (INDEPENDENT_AMBULATORY_CARE_PROVIDER_SITE_OTHER): Payer: Medicare PPO

## 2010-11-13 DIAGNOSIS — I428 Other cardiomyopathies: Secondary | ICD-10-CM

## 2010-11-13 DIAGNOSIS — I509 Heart failure, unspecified: Secondary | ICD-10-CM

## 2010-11-20 ENCOUNTER — Encounter: Payer: Self-pay | Admitting: *Deleted

## 2010-11-25 NOTE — Letter (Signed)
Summary: Remote Device Check  [name], [name]  [id number] [place]   [place], [place] [address]   Phone: [phone]  Fax: [phone]     November 20, 2010 MRN: [id number]   [place]  [address]   Dear Ms. Geddes,   Your remote transmission was recieved and reviewed by your physician.  All diagnostics were within normal limits for you.  __X___Your next transmission is scheduled for: 02/12/11.   Please transmit at any time this day.  If you have a wireless device your transmission will be sent automatically.  ______Your next office visit is scheduled for:                              . Please call our office to schedule an appointment.    Sincerely,  Alma Friendly, LPN

## 2010-11-25 NOTE — Cardiovascular Report (Signed)
Summary: Office Visit   Office Visit   Imported By: Sallee Provencal 11/21/2010 16:13:22  _____________________________________________________________________  External Attachment:    Type:   Image     Comment:   External Document

## 2010-12-09 LAB — POCT I-STAT 3, VENOUS BLOOD GAS (G3P V)
Acid-Base Excess: 1 mmol/L (ref 0.0–2.0)
TCO2: 31 mmol/L (ref 0–100)
pCO2, Ven: 49.4 mmHg (ref 45.0–50.0)
pCO2, Ven: 49.9 mmHg (ref 45.0–50.0)
pH, Ven: 7.382 — ABNORMAL HIGH (ref 7.250–7.300)
pO2, Ven: 36 mmHg (ref 30.0–45.0)

## 2010-12-09 LAB — POCT I-STAT 3, ART BLOOD GAS (G3+)
Acid-Base Excess: 3 mmol/L — ABNORMAL HIGH (ref 0.0–2.0)
Bicarbonate: 27.5 mEq/L — ABNORMAL HIGH (ref 20.0–24.0)

## 2010-12-16 ENCOUNTER — Other Ambulatory Visit: Payer: Self-pay | Admitting: *Deleted

## 2010-12-16 MED ORDER — SIMVASTATIN 40 MG PO TABS: 40.0000 mg | ORAL_TABLET | Freq: Every evening | ORAL | Status: DC

## 2010-12-16 NOTE — Telephone Encounter (Signed)
Addended by: Mignon Pine on: 12/16/2010 07:03 PM   Modules accepted: Orders

## 2010-12-24 ENCOUNTER — Telehealth: Payer: Self-pay | Admitting: Internal Medicine

## 2010-12-24 NOTE — Telephone Encounter (Signed)
Already done

## 2010-12-24 NOTE — Telephone Encounter (Signed)
Pt needs  simvastatin 40mg  to be called in to right source.

## 2011-01-20 NOTE — Assessment & Plan Note (Signed)
[facility]                         ELECTROPHYSIOLOGY OFFICE NOTE   [name], [name]            MRN:          [id number]  DATE:08/27/2008                            DOB:          08-Dec-1955    [name] was admitted for AV optimization.  We ultimately ended up  at 80 msec for her sensed AV delay.  LV/RV was left at 0.   She is to call us and let us know how she does.     [name], MD, [facility]  Electronically Signed    SCK/MedQ  DD: 08/27/2008  DT: 08/28/2008  Job #: [id number]

## 2011-01-20 NOTE — Assessment & Plan Note (Signed)
[facility] OFFICE NOTE   KADIESHA, AYON            MRN:          [id number]  DATE:08/10/2007                            DOB:          August 03, 1956    Mr. Hochstetler is seen following CRT implantation for non-ischemic  cardiomyopathy and congestive heart failure.  She continues to do much  better than prior to her CRT.  She does have some fatigue and some  shortness of breath.   Her medications have been adjusted for hypotension.  She is currently  taking:   1. Cozaar 50.  2. Coreg 6.25 b.i.d.  3. Aldactone.  4. Furosemide.   ON EXAMINATION:  Her blood pressure today was a little bit higher for  her than normal, which runs about 105.  It was 124/76 with a pulse of  86.  Her lungs were clear.  Her neck veins were flat.  Her carotids were brisk and full bilaterally  without bruits.  The extremities were without edema.   Interrogation of her Medtronic Concerto device demonstrates a P-wave of  3.4 with impedance of 416, a threshold of 0.5 at 0.4 in the RA and the  RV.  The RV amplitude was 14.2 with impedance of 744, the LV impedance  was 648 and a threshold of 1.5 at 0.4.  She is atrially paced only 2% of  the time.  She is ventricularly paced about 9% of the time.  Her patient  activity index remains very, very good.   IMPRESSION:  1. Non-ischemic cardiomyopathy.  2. Congestive heart failure.  3. Status post CRT for the above.   Mrs. Hehr is really stable.  We will plan to do again in one year's  time and she will be followed remotely in the interim.     [name], MD, [facility]  Electronically Signed    SCK/MedQ  DD: 08/10/2007  DT: 08/10/2007  Job #: [id number]   cc:   Salley Slaughter

## 2011-01-20 NOTE — Assessment & Plan Note (Signed)
[name]   [name], [name]            MRN:          [id number]  DATE:08/14/2008                            DOB:          [birth year]    Vincent Gros is seen today in follow-up for congestive heart  failure in the setting of nonischemic cardiomyopathy.  She is status  post CRTD implantation about 2 years ago.  Over the recent months she  has noted increasing problems with shortness of breath, particularly  with activities around the house, cleaning up and climbing stairs or  caring for grandchildren.  She has not noted any peripheral edema.  There has been no orthopnea and no accompanying chest discomfort.  There  is no significant change in her diet.   Her medications are notable for the intercurrent down titration of her  Coreg because of hypotension.  The up titration of Cozaar and the  increase her diuretic to b.i.d.   She has had recent blood work done by her primary care physician.  We do  not have access to that.   [name], MD, [facility] has that and he will be seeing her next week.   On examination her weight is notable for being up about 6 pounds over  the last year.  Her blood pressure is good at 100/64, the pulse was 75.  There was no JVD today.  Lungs were clear.  The heart sounds were regular without murmurs.  There was no HJR.  Abdomen was soft.  The extremities had no edema.   Interrogation of Medtronic Concerto ICD demonstrated P-wave of 3.2 with  impedance of 368, a threshold of 1 volt at 0.4 in RA and RV the  impedance was 576 and the amplitude was 13.4 in the RV and the LV  impedance of 640 with a threshold of 2 volts at 0.4.  She has a 6949  lead and the SIC count is 3.  Her activity monitor is good.  Her optimal  index is flat.   IMPRESSION:  1. Nonischemic cardiomyopathy.  2. Status post cardiac resynchronization therapy defibrillator for  #1.  3. Congestive heart failure - chronic - question acute on chronic      manifested by dyspnea.  4. A 6949 lead.  5. Obesity.   Mrs. Kagle has been deteriorated somewhat over the last month.  Her  weight suggests fluid, but there is no evidence of it either by  examination or OptiVol.  She did have blood work done a couple of weeks  ago and I will assume that a BNP was drawn; [name], MD, [facility] will  have a chance to review these labs next week and if not it may be  valuable to do that.   In patients who have deteriorating response to CRT, protocol includes  repeated chest x-ray to make sure there is no macro dislodgement of the  lead.  This will be followed by AV optimization echo.  VV timing is  something that we can also do using our surface QRS which may be as  valid an indicator as anything, although we do not know  how translates  into improved outcomes.   We will see her again in just a couple of weeks for the AV optimization  echo, otherwise she will be followed remotely.     [name], MD, [facility]  Electronically Signed    SCK/MedQ  DD: 08/14/2008  DT: 08/14/2008  Job #: [id number]

## 2011-01-20 NOTE — Assessment & Plan Note (Signed)
[facility]                          [facility] OFFICE NOTE   [name], [name]            MRN:          [id number]  DATE:10/14/2007                            DOB:          1956/03/18    HISTORY OF PRESENT ILLNESS:  The patient is a 55 year old female with  nonischemic cardiomyopathy status post CRT implantation with lead  revision.  The patient is doing well.  She denies any substernal chest  pain, shortness of breath, orthopnea, PND.  She  has no palpitations or  syncope.   MEDICATIONS:  1. Multivitamin.  2. Aldactone 25 mg p.o. daily.  3. Lasix 1/2 tablet p.o. daily.  4. Zyrtec 10 mg p.o. daily.  5. Cozaar 50 mg, 1/2 tablet p.o. daily.  6. Coreg 3.125 b.i.d.  7. Potassium 20 mEq p.o. daily.   PHYSICAL EXAMINATION:  VITAL SIGNS:  Blood pressure is 114/72, heart  rate 84, weight 199 pounds.  NECK EXAM:  Normal carotid upstroke, normal carotid bruits.  LUNGS:  Clear breath sounds bilaterally.  HEART:  Regular rate and rhythm.  Normal S1 S2.  No murmurs, rubs or  gallops.  ABDOMEN:  Soft, nontender.  No rebound or guarding.  Good bowel sounds.  EXTREMITY EXAM:  No cyanosis, clubbing or edema.  NEURO:  Patient is alert, oriented and grossly nonfocal.   PROBLEMS:  1. Nonischemic cardiomyopathy with chronic systolic heart failure,      well compensated.  2. Status post CRT implantation.  3. History of mastectomy.  4. Prior Adriamycin therapy.   PLAN:  1. The patient is doing well.  She will not up titrate her Coreg given      the fact that with prior up titration she had more fatigue and      weakness.  2. The patient's functional status remained stable and she is NYHA      Class IIB.  3. Laboratory work will be performed including CBC, CMET, BNP and TSH.  4. Refills were called in for this patient.     [name], MD,FACC  Electronically Signed    GED/MedQ  DD: 10/14/2007  DT: 10/16/2007  Job #: [id number]   cc:    [name], MD

## 2011-01-20 NOTE — Assessment & Plan Note (Signed)
[facility] OFFICE NOTE   [name], [name]            MRN:          [id number]  DATE:08/22/2008                            DOB:          02-Jun-1956    PRIMARY CARDIOLOGIST:  [name], MD, [facility]   PRIMARY ELECTROPHYSIOLOGIST:  [name], MD, [facility]   REASON FOR VISIT:  Six-month followup.   Since last seen in the clinic, [name] does report worsening  exertional dyspnea, absent of associated chest discomfort, PND,  orthopnea, or worsening lower extremity edema.  She was recently seen  for scheduled followup by Dr. Virl Axe, in [place], who referred  her for a surveillance 2-D echocardiogram next Monday.  No medication  adjustments were made.  ICD interrogation was done, with no noted  adjustments.  Of note, Dr. Caryl Comes indicated that there was no evidence of  volume overload, either by physical examination or by OptiVol sensing.  He apparently was concerned, however, that the patient might have a  deteriorating response to CRT.  The echo will be an AV optimization  echo.  A CXR was also obtained, and reportedly negative.   EKG in our office today indicates NSR at 85 bpm with ventricular pacing  spikes noted.   CURRENT MEDICATIONS:  1. __________ 25 daily.  2. Carvedilol 3.125 b.i.d.  3. Cozaar 25 daily.  4. Potassium 20 every other day.  5. Citalopram.  6. Zolpidem.   PHYSICAL EXAMINATION:  VITAL SIGNS:  Blood pressure 105/67, pulse 81 and  regular, weight 201 (up 2 pounds).  GENERAL:  A 55 year old female, mildly obese, sitting upright, in no  distress.  CARDIAC:  No significant murmurs.  No rubs.  ABDOMEN:  Benign.  EXTREMITIES:  Trace edema.  NEUROLOGIC:  No focal deficit.   IMPRESSION:  1. Nonischemic cardiomyopathy.      a.     EF was 35% by 2-D echo, June 2008.      b.     Status post cardiac resynchronization therapy-defibrillator       implantation.  2.  Status post mastectomy, prior Adriamycin therapy.   PLAN:  1. Following review by Dr. Dannielle Burn, recommendation is to adjust her      diuretic regimen slightly with uptitration of Lasix to 80 mg      q.a.m./40 mg q.p.m. for 1 week, and then to resume her maintenance      dose of 40 mg p.o. b.i.d.  This is based on the fact that we do      feel that there is some evidence of volume retention both by      physical exam, as well as by the noted increase in weight.  2. Surveillance blood workup deferred for 1 week, following      uptitration of Lasix.  We will check a BMET, BNP level, as well as      a CBC, the latter in response to recent suggestion of easy      bruisability.  3. Schedule return clinic followup with myself and Dr. Dannielle Burn in 6      months.  The patient is to follow up  with Dr. Caryl Comes as scheduled,      including the AV optimization echo study scheduled for August 27, 2008.      Gene Serpe, PA-C  Electronically Signed      [name], MD,FACC  Electronically Signed   GS/MedQ  DD: 08/22/2008  DT: 08/23/2008  Job #: [id number]   cc:   Dr. [name]

## 2011-01-23 NOTE — Assessment & Plan Note (Signed)
[facility]                           ELECTROPHYSIOLOGY OFFICE NOTE   SHAKTI, KETCHAM            MRN:          [id number]  DATE:05/17/2006                            DOB:          09-25-1955    [name] is seen in device clinic today, May 17, 2006, for  followup of her newly implanted Medtronic [facility] 0000000, dual-  chamber, biventricular defibrillator implanted April 27, 2006 for  nonischemic cardiomyopathy by Dr. Caryl Comes.   Upon exam, the site looks good without redness or swelling.  Steri-Strips  had been previously removed by the patient.  Upon interrogation, battery  voltage is 3.21 volts with last recorded charge time of 8.5 seconds.  In the  atrium intrinsic amplitude is 3.1 MV with an impedance of 440 ohms and  threshold of 1 V at 0.3 Msec.  In the right ventricle intrinsic amplitude is  14 MV with an impedance of 672 ohms, and threshold of 1 V at 0.2 Msec.  At  the left ventricular, impedance is 576 ohms with threshold of 1 V at 0.1  Msec.  No episodes were stored in the device.   She does report that she has a great increase in her energy level.  No  programming changes were made at this time.  She was enrolled in [place].  She will see Dr. Caryl Comes in February and begin transmissions on [facility] after  that time.                                   Manus Rudd, RN                                [name], MD, [facility]   CF/MedQ  DD:  05/17/2006  DT:  05/17/2006  Job #:  [id number]

## 2011-01-23 NOTE — Op Note (Signed)
NAMEJALEIGHA, MIRENDA NO.:  [id number]   MEDICAL RECORD NO.:  [id number]          PATIENT TYPE:  INP   LOCATION:  [id number]                         FACILITY:  [facility]   PHYSICIAN:  [name], MD,FACCDATE OF BIRTH:  [birth year]   DATE OF PROCEDURE:  04/27/2006  DATE OF DISCHARGE:                                 OPERATIVE REPORT   PREOPERATIVE DIAGNOSIS:  Nonischemic cardiomyopathy, class III congestive  heart failure, narrow QRS with an abnormal tissue Doppler for dyssynchrony.   POSTOPERATIVE DIAGNOSIS:  Nonischemic cardiomyopathy, class III congestive  heart failure, narrow QRS with an abnormal tissue Doppler for dyssynchrony.   PROCEDURE:  Dual-chamber defibrillator implantation with left ventricular  lead placement and defibrillation threshold testing.   PROCEDURE IN DETAIL:  Having obtained informed consent, the patient was  brought to the electrophysiology laboratory and placed on the fluoroscopic  table in supine position.  After routine prep and drape of the right upper  chest, the right side being used because of a prior left mastectomy,  lidocaine was infiltrated in the prepectoral groove.  An incision was made,  then carried down to the layer of the prepectoral fascia with electrocautery  and sharp dissection.  A pocket was formed similarly.  Hemostasis was  obtained.   Thereafter, attention was turned to gain access to the extrathoracic right  subclavian vein which was accomplished with some modest difficulty but  without the aspiration of air or puncture of the artery.  Two separate  venipunctures were accomplished and we actually double-wired for the RV and  RA leads.  Over one of these wires, a 7 French sheath was placed which was  then passed through Medtronic 6949 58-cm dual-coil active fixation  defibrillator lead, serial number [id number].  It was manipulated at the  right ventricular apex where the bipolar R wave was 7.2 with an impedance of  1552 ohms, a threshold of 0.8 volts at 0.5 milliseconds.  Current threshold  is 0.6 mA and there is no diaphragmatic pacing at 10 volts.  This lead was  secured in the prepectoral fascia and over the previously implanted [place]  wire was placed a Medtronic MB1 sheath which allowed for cannulation of the  coronary sinus with modest difficulty.  A neutral venogram failed to show  distal flow and so we ended up doing a balloon venogram that was occlusive,  and what we found is that the coronary sinus was, in fact, very narrow and  quite serpentine.  There was a branch, however, that was quite proximal that  traversed out on to the lateral wall and it was this that was targeted.  We  ended up using a Medtronic Attain II coronary sinus subsection catheter  which allowed for [place] to deep throat the MB1 sheath into this branch.  This  allowed [place] to use a Medtronic 4194 88-cm bipolar LV lead, serial number  [id number].  This was manipulated out on to the left ventricular free wall  with a cephalad orientation at the end, about half way between base and  apex, and in this location the bipolar L wave was 9.8 with a  pace impedance  of 1439 ohms.  Threshold was 1.1 volts at 0.5 milliseconds.  The current  threshold is 0.9 mA.  There was no diaphragmatic pacing at 10 volts.  The  9.5 French sheath was removed.  The CS sheath was left in place and over the  last retained guide wire was placed a 7 French sheath through which was then  passed the Medtronic 5076 52-cm active fixation atrial lead, serial number  [id number].  It was manipulated to the right atrial free wall where the  bipolar P wave was 2.6 millivolts with a pace impedance of 756 ohms, a  threshold of 2.9 volts at 0.5 milliseconds immediately after lead  deployment.  Current threshold was 4.5 mA. This was accepted because of a  very brisk current of injury.   The leads were then secured to a Medtronic Concerto 0000000 device, serial  number  [id number].  Through the device, the bipolar P wave was 1.3 with a  pace impedance of 592 ohms, a threshold of 1.5 volts at 0.3 milliseconds.  The R wave was 12.5 with impedance of 984, with a threshold of 1 volt at  0.06 milliseconds, and the LV impedance was 352 and threshold that increased  to 3 volts at 0.3 milliseconds.  Proximal coil impedance was 56 ohms.  Distal coil impedance was 47 ohms, and these parameters were accepted after  the fluoroscopy image was stable.  Please note that the configuration is LV  coil to RV coil.   Defibrillation threshold testing was then undertaken.  Ventricular  fibrillation was induced through the T wave shock.  After a total duration  of 6 seconds, a 15-joule shock was delivered through a measure of resistance  of 46 ohms, terminating in ventricular fibrillation restoring sinus rhythm.   After a wait of 5-6 minutes, ventricular fibrillation was re-induced through  the T wave shock.  After a total duration of 7 seconds, a 15-joule shock was  delivered to a measure of resistance of 47 ohms, terminating ventricular  fibrillation, giving rise to ventricular tachycardia that terminated  spontaneously to sinus rhythm.  At this point, the device was implanted.  The pocket was copiously irrigated with antibiotic-containing saline  solution.  Hemostasis was assured.  The leads and the pulse generator were  placed in the pocket.  Because the incision was more lateral than I had  intended, I ended up expanding the pocket medially and I sutured the device  as medially in the pocket as I could make it go.  The coursing of the leads  was somewhat lateral to the lateral aspect of the device and so I put in an  anchoring suture to contain these leads underneath the body of the device.   The wound was then closed in three layers in the normal fashion.  The wound  was washed, dried, and a Benzoin and Steri-Strip dressing was applied. Needle counts, sponge counts and  instrument counts were correct at the end  of the procedure according to the staff.  The patient tolerated the  procedure without apparent complication.   FLUOROSCOPY TIME:  19 minutes 11 seconds.   COMPLICATIONS:  There was a needle stick of one of the staff.           ______________________________  [name], MD,FACC     SCK/MEDQ  D:  04/27/2006  T:  04/27/2006  Job:  [id number]   cc:   Salley Slaughter  Electrophysiology laboratory  Dr.  Merrilee Seashore  [place], [employer]

## 2011-01-23 NOTE — Assessment & Plan Note (Signed)
[facility] OFFICE NOTE   JETTE, WHITMIRE            MRN:          [id number]  DATE:01/11/2007                            DOB:          01/25/1956    ADDENDUM:   CURRENT MEDICATIONS:  1. Allegra 180 mg p.o. daily.  2. Coreg 6.25 b.i.d.  3. Multivitamin one tablet p.o. daily.  4. Aldactone 25 mg p.o. daily.  5. Lasix a half tablet 40 mg p.o. daily.  6. Cozaar 50 mg p.o. daily.  7. Potassium 20 mEq p.o. daily.   PHYSICAL EXAMINATION:  VITAL SIGNS: Blood pressure is 94/62, heart rate  is 89 beats per minute, weight is 200 pounds.  NECK: Normal carotid upstroke. No carotid bruits.  LUNGS:  Clear breath sounds bilaterally.  HEART: Regular rate and rhythm. Normal S1, S2. No murmur, rubs or  gallops.  ABDOMEN: Soft and nontender. No rebound or guarding. Good bowel sounds.  EXTREMITIES: No cyanosis, clubbing or edema.  NEURO: The patient is alert, oriented and grossly nonfocal.   PROBLEM LIST:  1. Non-ischemic cardiomyopathy, chronic systolic heart failure.  2. Status post CRT implantation.  3. Mastectomy.  4. Prior Adriamycin therapy.  5. Weakness and fatigue.   PLAN:  1. The patient complains of increased weakness and fatigue without any      significant shortness of breath.  2. I have cut the patient's Coreg to 3.125 b.i.d. and Cozaar 25 mg      p.o. daily in light of her low blood pressures.  3. Will also check the patient's echocardiographic study to reassess      her left ventricular function.  4. Additional blood work will be drawn including CBC, CMET, and BNP      and PT and INR. This will be done in [place], [place] per the      patient's request.    [name], MD,FACC    GED/MedQ  DD: 01/11/2007  DT: 01/11/2007  Job #: [id number]

## 2011-01-23 NOTE — Op Note (Signed)
NAMEJORY, [name]          ACCOUNT NO.:  [id number]   MEDICAL RECORD NO.:  [id number]          PATIENT TYPE:  INP   LOCATION:  4715                         FACILITY:  [facility]   PHYSICIAN:  Champ Mungo. Lovena Le, MD    DATE OF BIRTH:  Feb 18, 1956   DATE OF PROCEDURE:  04/28/2006  DATE OF DISCHARGE:                                 OPERATIVE REPORT   PROCEDURE PERFORMED:  Left ventricular lead revision.   INDICATIONS:  Status post bi-V ICD implantation with the patient developing  diaphragmatic stimulation.   INTRODUCTION:  The patient is a 55 year old with a history of breast cancer,  status post Adriamycin therapy and therapy and developed congestive heart  failure.  She apparently had evidence of dyssynchrony and was admitted for  biventricular ICD implantation.  The patient developed diaphragmatic  stimulation following her procedure and is now referred for LV lead  revision.   PROCEDURE:  After informed consent was obtained, the patient taken to the  diagnostic catheterization lab in a fasting state.  After the usual  preparation and draping, intravenous fentanyl and midazolam were given for  sedation.  Lidocaine 30 mL was infiltrated into the left infraclavicular  region.  A total of 30 mL of lidocaine was infiltrated over the old  infraclavicular region and a 7 cm incision was carried out over this region  and electrocautery utilized to dissect down to the fascial plane.  The left  ventricular lead was freed up from its sewing sleeve and silk suture without  difficulty.  A Mailman 0.014 angioplasty wire was advanced into the left  ventricular lead.  The left ventricular lead was with withdrawn from a  distance that was initially two-thirds from base to apex to a position one-  half the distance from base to apex.  In this location there was no  diaphragmatic stimulation.  Attempts to advance the lead just slightly  further were attempted but were unsuccessful.  At this point  the threshold  was 1.2 V at 0.5 msec, the R-waves measured 6, and the pacing impedance was  763 ohms.  With these satisfactory parameters, the lead was resecured to the  subpectoralis fascia with a figure-of-eight silk suture and the sewing  sleeve also secured with silk suture.  Kanamycin irrigation was utilized to  irrigate the pocket and the LV lead was reconnected to the previously-  implanted Medtronic bi-V defibrillator and all the leads were placed back in  the subcutaneous pocket.  Additional kanamycin was utilized to irrigate the  pocket and electrocautery used to assure hemostasis.  The incision was  closed with a layer of 2-0 Vicryl, followed by a layer of 3-0 Vicryl,  followed by a layer of 4-0 Vicryl.  Benzoin was painted on the skin, Steri-  Strips were applied and a pressure dressing was placed and the patient was  returned to her room in satisfactory condition.   COMPLICATIONS:  There were no immediate procedure complications.   RESULTS:  This demonstrates successful biventricular implantable  cardioverter-defibrillator lead revision in a patient with diaphragmatic  stimulation in the left ventricular lead.  ______________________________  Champ Mungo. Lovena Le, MD    GWT/MEDQ  D:  04/28/2006  T:  04/29/2006  Job:  [id number]   cc:   [name], MD,FACC

## 2011-01-23 NOTE — Assessment & Plan Note (Signed)
[facility]                          [facility] OFFICE NOTE   LAURALEA, FASICK            MRN:          [id number]  DATE:01/11/2007                            DOB:          December 17, 1955    HISTORY OF PRESENT ILLNESS:  Patient is a 55 year old female with  nonischemic cardiomyopathy, status post CRT implantation with lead  revision.  Patient saw Dr. Caryl Comes last in December, 2007.  She has been  doing quite well.  She denies any chest pain.  She is NYHA class IIB.  She has no orthopnea or PND.  She does complain of some dizziness and  weakness in general.   CURRENT MEDICATIONS:  1. Allegra 180 mg p.o. daily.  2. Coreg 6.25 b.i.d.  3. Multivitamin one tablet p.o. daily.  4. Aldactone 25 mg p.o. daily.  5. Lasix a half tablet 40 mg p.o. daily.  6. Cozaar 50 mg p.o. daily.  7. Potassium 20 mEq p.o. daily.   PHYSICAL EXAMINATION:  VITAL SIGNS: Blood pressure is 94/62, heart rate  is 89 beats per minute, weight is 200 pounds.  NECK: Normal carotid upstroke. No carotid bruits.  LUNGS:  Clear breath sounds bilaterally.  HEART: Regular rate and rhythm. Normal S1, S2. No murmur, rubs or  gallops.  ABDOMEN: Soft and nontender. No rebound or guarding. Good bowel sounds.  EXTREMITIES: No cyanosis, clubbing or edema.  NEURO: The patient is alert, oriented and grossly nonfocal.   PROBLEM LIST:  1. Non-ischemic cardiomyopathy, chronic systolic heart failure.  2. Status post CRT implantation.  3. Mastectomy.  4. Prior Adriamycin therapy.  5. Weakness and fatigue.   PLAN:  1. The patient complains of increased weakness and fatigue without any      significant shortness of breath.  2. I have cut the patient's Coreg to 3.125 b.i.d. and Cozaar 25 mg      p.o. daily in light of her low blood pressures.  3. Will also check the patient's echocardiographic study to reassess      her left ventricular function.  4. Additional blood work will be  drawn including CBC, CMET, and BNP      and PT and INR. This will be done in [place], [place] per the      patient's request.     [name], MD,FACC     GED/MedQ  DD: 01/11/2007  DT: 01/11/2007  Job #: [id number]   cc:   [name], MD, [facility]

## 2011-01-23 NOTE — Discharge Summary (Signed)
[name], [name] NO.:  [id number]   MEDICAL RECORD NO.:  [id number]          PATIENT TYPE:  INP   LOCATION:  4715                         FACILITY:  [facility]   PHYSICIAN:  Sueanne Margarita, PA   DATE OF BIRTH:  11-16-1955   DATE OF ADMISSION:  04/27/2006  DATE OF DISCHARGE:                                 DISCHARGE SUMMARY   ADDENDUM   She has an office visit with Dr. Terald Sleeper on Monday, September 24, at 1:45  p.m. This is a post hospital visit and follow up for congestive heart  failure.           ______________________________  Sueanne Margarita, PA     GM/MEDQ  D:  04/29/2006  T:  04/29/2006  Job:  [id number]   cc:   [name], MD,FACC

## 2011-01-23 NOTE — Discharge Summary (Signed)
NAMEARETI, [name]:  [id number]   MEDICAL RECORD NO.:  [id number]          PATIENT TYPE:  INP   LOCATION:  [id number]                         FACILITY:  [facility]   PHYSICIAN:  [name], MD,FACCDATE OF BIRTH:  11-13-1955   DATE OF ADMISSION:  04/27/2006  DATE OF DISCHARGE:  04/29/2006                                 DISCHARGE SUMMARY   PRIMARY CARE PHYSICIAN:  Dr. Dwain [name].   ALLERGIES:  No known drug allergies.   DISCHARGE DIAGNOSES:  1. Discharging day #1, status post left ventricular lead revision for      diaphragmatic stimulation.  2. Implant Medtronic Concerto CRT-D on April 27, 2006.  3. Nonischemic cardiomyopathy, ejection fraction 25%.  4. Class III congestive heart failure.  5. Tissue Doppler study showing cardiac dyssynchrony.  6. Status post left mastectomy with followup Adriamycin chemotherapy.  7. Gastroesophageal reflux disease.  8. Tachycardic palpitations and presyncope.   PROCEDURES:  1. On April 27, 2006, implant of a Medtronic Concerto BiV-ICD with      defibrillator threshold study less than or equal to 15 joules, by Dr.      Virl [name].  2. On April 28, 2006, left ventricular lead revision by Dr. Cristopher [name]      for recurrent diaphragmatic stimulation.   HISTORY OF PRESENT ILLNESS:  Ms. [name] is a 55 year old female.  She is  9 years status post left mastectomy and Adriamycin chemotherapy.  In January  of this year, she presented with tachycardic palpitations and congestive  heart failure.  She was seen to have an ejection fraction of 25%.  She was  tried with maximum medical therapy and her symptoms improved initially, but  her LV function has not particularly improved and remains in the 25% range.  Her current complaints are nocturnal dyspnea and orthopnea.  She does not  have peripheral edema.  She is unable to walk 50 yards without becoming  dyspneic.  She has also history of tachycardic palpitations  lasting seconds  and associated with presyncope.  There have been no sustained episodes or  syncope.  Up-titration of medications has been limited by hypotension.  She  has had tissue Doppler study which shows significant dyssynchrony. The  patient has had consultation with Dr. Virl [name] and he feels that she  would benefit from both cardioverter defibrillator for sudden death  prevention as well as cardiac resynchronization therapy to help reduce  symptoms of congestive heart failure.  The patient has had a left mastectomy  and we anticipate implanting the  device from the right side.  A venogram  will be performed prior to the implant to make sure there is no venous  obstruction.  The patient was reviewed the risks and benefits with Dr. Caryl [name]  and wishes to proceed.   HOSPITAL COURSE:  The patient presented on August 21, electively of [facility] and had a implant of Medtronic device with left ventricular  lead placement.  The patient did initially very well, especially when lying  horizontally, but as she began to sit up and become more mobile, she noted  that she  was having some diaphragmatic stimulation.  On the morning of  August 22, the Medtronic technical representative interrogated device and  tried several methods to abrogate recurrence of diaphragmatic stimulation,  however, with relief of diaphragmatic stimulation, capture could not be  obtained.  The patient will then be referred to Dr. Olin [name] partner, Dr.  Lovena [name], for left ventricular lead repositioning.  This was done successfully  on August 22.  The patient has had no further diaphragmatic stimulation.  Her generator pocket is without hematoma.  She has some mild soreness which  is helped with oral analgesia.  There is no erythema or drainage from the  pocket.  She is BiV pacing without diaphragmatic stimulation.  She will  discharge August 23, after inspection of chest x-ray.  Mobility of the right  arm has  been described to the patient.  She is asked not to lift anything  heavier than 10 pounds.  She is also going to keep her incision dry for the  next 7 days and to sponge bathe until Wednesday, August 29.  She is allow 4  weeks to pass before riding in a vehicle over rough roads.   DISCHARGE MEDICATIONS:  1. Lasix 40 mg daily.  2. Potassium chloride 20 mEq daily (this is a new medication).  3. Coreg 6.25 mg twice daily.  4. Multivitamin daily.  5. Cozaar 50 mg daily.  6. Spironolactone 25 mg daily.  7. Claritin 10 mg daily.  8. (New) Percocet 5/325 one to two tablets every 4-6 hours as needed for      pain.  9. Keflex 500 mg one tablet 30 minutes before breakfast, lunch and dinner      and at bedtime for the next 5 days.   FOLLOW UP:  She has followup at [facility], [address] in [place] at the [facility], Monday, September 10, at 9 a.m.  She will see Dr. Caryl [name] in December.  His office will call with an  appointment.   LABORATORY STUDIES:  On August 22, sodium was 136, potassium was 4, chloride  104, carbonate 24, BUN is 8, creatinine 1, glucose is 153.  She is on  potassium supplementation because her admission potassium was 3.4.  Magnesium this admission was 2.2.     ______________________________  Natasha Margarita, PA    ______________________________  [name], MD,FACC    GM/MEDQ  D:  04/29/2006  T:  04/29/2006  Job:  [id number]   cc:   [name], M.D,

## 2011-01-23 NOTE — Assessment & Plan Note (Signed)
[facility] OFFICE NOTE   [name], [name]            MRN:          [id number]  DATE:05/31/2006                            DOB:          [birth year]    PRIMARY CARDIOLOGIST:  Dr. Terald Sleeper   PRIMARY ELECTROPHYSIOLOGIST:  Dr. Virl Axe   REASON FOR OFFICE VISIT:  Post hospital followup.   [name] is a very pleasant 55 year old female with severe nonischemic  cardiomyopathy, recently referred to Dr. Virl Axe for consideration of  biventricular ICD implantation.  She underwent successful placement of a  Medtronic biventricular ICD by Dr. Virl Axe on April 27, 2006.  She  then returned the following day for left ventricular lead revision by Dr.  Cristopher Peru for treatment of recurrent diaphragmatic stimulation.   The patient has had followup in the pacer clinic in [place] on September  10, at which time no programming changes were required.   From a clinical standpoint, [name] states that she has been feeling  great since undergoing the procedure.  She has restored energy and has no  further dyspnea.  She has also resumed work.  She has noted no complications  from the ICD implantation site save for recent finding of a stitch which has  become evident following peeling away of the Steri-Strips.  However, there  has been no development of hematoma, erythema, or oozing at the site.   CURRENT MEDICATIONS:  1. Allegra.  2. Aldactone 25 daily.  3. Lasix one-half tablet daily.  4. Cozaar 50 daily.  5. Potassium 40 daily.   PHYSICAL EXAMINATION:  VITAL SIGNS:  Blood pressure 104/70, pulse 86 and  regular, weight 194.  NECK:  No JVD.  LUNGS:  Clear to auscultation all fields.  HEART:  Regular rate and rhythm (S1, S2).  No significant murmurs.  No S3.  No rubs.  SKIN:  Right upper chest ICD site with well-healed incision; no hematoma,  erythema, or oozing.  There  is a lone stitch which is approximately a  quarter of an inch extending from the lower incision site with no peri-  incisional erythema.  EXTREMITIES:  No edema.   IMPRESSION:  1. Severe nonischemic cardiomyopathy.      a.     Status post successful Medtronic biventricular ICD implantation       August 21; left ventricular lead revision performed the following day       for treatment of recurrent diaphragmatic stimulation.      b.     Ejection fraction 25%.      c.     Positive tissue Doppler study for dyssynchrony.  2. Congestive heart failure:  Currently euvolemic.  3. History of breast cancer:  Status post left mastectomy/chemotherapy      with Adriamycin/anthracycline.  4. History of moderate mitral regurgitation:  Echocardiogram June 2007.   PLAN:  The patient is doing extremely well following recent implantation of  a biventricular defibrillator device.  A solitary stitch has recently  surfaced and this was successfully removed during our clinic visit today  with no complications.  The patient did have a  recent adjustment of her  current medication regimen per Dr. Virl Axe based on followup blood  work.  We will therefore not make any additional adjustments at this time.  We will plan on having her resume general cardiology followup with Dr.  Dannielle Burn in 6 months.  She will follow up with Dr. Virl Axe as scheduled.                                   Gene Serpe, PA-C                                [name], MD, [facility]   GS/MedQ  DD:  05/31/2006  DT:  06/02/2006  Job #:  [id number]

## 2011-01-23 NOTE — Assessment & Plan Note (Signed)
[facility]                            [facility] OFFICE NOTE   [name], [name]            MRN:          [id number]  DATE:03/17/2006                            DOB:          [birth year]    HISTORY OF PRESENT ILLNESS:  Patient is a 55 year old female with a history  of nonischemic cardiomyopathy.  It is felt that her nonischemic  cardiomyopathy may be either viral-related or due to prior use of  Adriamycin/anthracycline drug therapy.  Patient was first seen on October 22, 2005.  She was an NYHA class II B at that time.  We up-titrated her  medical regimen, now with improvement in her symptomatology.  Patient feels  that she can do most of her house chores.  Functional capacity has improved;  however, she does remain fatigued and tired at times.  She had a prior  stress test done earlier this year, which showed no evidence of ischemia.  She is now more than six months out from her original echocardiographic  study and stress test, and we repeated the ultrasound.  The echocardiogram  demonstrated continued severe LV systolic function with an ejection fraction  of 25-30% with diffuse global hypokinesis.  Tissue Doppler examination was  also positive for left ventricular dyssynchrony.  The patient's EKG,  however, demonstrates a narrow QRS.   The patient now presents for followup to discuss the results of her  echocardiographic study as well as possible consideration of need for  implantation of defibrillator and/or CRT device.   MEDICATIONS:  1.  Allegra 80 mg p.o. daily.  2.  Coreg 6.25 b.i.d.  3.  Lasix 40 mg p.o. b.i.d.  4.  Aldactone 25 mg p.o. daily.  5.  Cozaar 25 mg p.o. daily.  6.  Multivitamin as directed.   PHYSICAL EXAMINATION:  VITAL SIGNS:  Blood pressure 90/70, heart rate 76  beats per minute.  Weight 199 pounds, which is down 2 pounds from previous  exam.  GENERAL:  Well-nourished white female in no apparent  distress.  HEENT:  Pupils __________ clear.  NECK:  JVD is approximately 8 cm.  LUNGS:  Clear breath sounds bilaterally.  HEART:  Regular rate and rhythm.  Normal S1 and S2.  I do not hear a  definite systolic murmur.  ABDOMEN:  Soft.  EXTREMITIES:  No clubbing, cyanosis or edema.   PROBLEM LIST:  1.  Cardiomyopathy:  Likely nonischemic in etiology.      1.  Negative for Cardiolite stress study in January, 2007.      2.  History of Adriamycin/anthracycline drug therapy.  2.  Rule out sleep apnea versus central sleep apnea with Cheyne-Stokes      respiration.  3.  Left ventricular dysfunction, ejection fraction 25-30% (greater than six      months).  4.  Positive tissue Doppler examination for dyssynchrony.  5.  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  6.  New York Heart Association class II.   PLAN:  1.  It appears that the patient's LV dysfunction is rather persistent.  We      have now documented an EF of  25-30% for at least six months in duration.      I anticipate that it her LV dysfunction may have been related to prior      anthracycline drug therapy.  2.  The patient has an optimal medical regimen.  As a matter of fact, I      cannot up-titrate her medications further due to her relative low blood      pressure.  3.  At this point in time, I do think it is reasonable to proceed with a      referral to Dr. Caryl Comes for consideration of implantable defibrillator.  4.  The decision for CRT device is more problematic, although it appears the      patient does have dyssynchrony by tissue Doppler examination.  She has a      narrow QRS, and her functional class may mitigate against the need for a      CRT device; however, we will have Dr. Caryl Comes make the final decision on      this issue.  5.  Patient will have a BMET, CBC, and a BNP level drawn today.  We will      also try to obtain her exercise data from cardiac rehab from      [facility], which  could help [place] in making a decision regarding her      need for CRT, in addition to ICD.                                   [name], MD, [facility]   GED/MedQ  DD:  03/17/2006  DT:  03/17/2006  Job #:  [id number]

## 2011-01-23 NOTE — Letter (Signed)
August 17, 2006    [name], MD,FACC  [address] [place],  [address]   RE:  AYNARA, URUETA  MRN:  [id number]  /  DOB:  01/21/1956   Dear [name],   Ms. Laird comes in, she continues to thrive following CRT  implantation. It turns out she has a 6949 lead which is a lead on  recall. Her lead implant date is only 11-1/2 months old but as of last  week recommendations are still that in patients who are not dependent  that we not think about taking these leads out. I think these  recommendations may change as more data is forthcoming.   In any case, she is doing well. Her medications are notable for a Coreg  dose of 6.25.   PHYSICAL EXAMINATION:  Her blood pressure is 122/72, pulse is 77.  LUNGS:  Clear.  HEART:  Sounds are regular.  EXTREMITIES:  Without edema.   Interrogation of her Medtronic Concerto 123456 lead demonstrated a P wave  of ______, impedance of 480 and R wave of 16.7, impedance 770 _______.  LV impedance was 640. High voltage impedance was 48 and proximal coil  impedance was 57. Thresholds were interrogated but I do not see them  right in front of me. The device was reprogrammed. She had had some  episodes of nonsustained ventricular tachycardia.   IMPRESSION:  1. Congestive heart failure - systolic - chronic.  2. Nonischemic cardiomyopathy.  3. Status post CRT implantation with lead revision.  4. History of mastectomy with prior Adriamycin therapy.   Ms. Polhill is doing well. She is to see you again in a couple of  weeks. I have increased her Coreg today to 12.5 and I would ask that you  increase it to 18.75 and then further up titrate it. She will be  followed remotely with [facility].    Sincerely,      [name], MD, [facility]  Electronically Signed    SCK/MedQ  DD: 08/17/2006  DT: 08/17/2006  Job #: [id number]

## 2011-01-23 NOTE — Letter (Signed)
Jan 19, 2007    Dr. Hart Carwin Kipreos  [id number]  [place] [address]  Fax:  [phone]   RE:  Natasha, [name]  MRN:  [id number]  /  DOB:  29-Aug-1956   Dear Dr. Raeanne Gathers:   I am in receipt of the lipid panel on Ms. [name].  The  patient's date of birth is 1956-02-11.  As reported, the patient's LDL  is 172, triglycerides 198, cholesterol of 249 total, HDL 37.   My recommendations, based on ATP-III guidelines, is to recommend only  medical treatment for an LDL of greater than 190.  My first  recommendation is to consider therapeutic life style changes.  As you  know, this patient has a non-ischemic cardiomyopathy and has no history  of coronary artery disease.  Statin drug therapy would be used in the  setting of primary prevention in this patient.   The patient's 10-year total of coronary artery disease risk is 5%, which  is actually ideal for age.  The average for age is actually 8%.  A 10-  year coronary artery disease risk is only 2%.  The patient falls in the  risk category of 0-1 risk factors.   According to my interpretation of the guidelines, we are not obliged to  recommend statin drug therapy at this point in time.  The LDL goal will  need to be at less than 160, but again I would try  to initiate therapeutic life style changes first, before committing this  patient to statin drug therapy, which in this risk category has a very  marginal benefit ratio anyway.   If you have any further questions regarding the treatment of her lipid  panel, please do not hesitate to contact me.  I appreciate your  forwarding me the results of her lipid panel.    Sincerely,      [name], MD,FACC    GED/MedQ  DD: 01/19/2007  DT: 01/19/2007  Job #: [id number]

## 2011-01-23 NOTE — Letter (Signed)
April 07, 2006     [name], M.D.  [address] [place], [place] [address]   RE:  Natasha, [name]  MRN:  [id number]  /  DOB:  [birth year]   Dear [name],   It is a pleasant to see [name] today at your request regarding  consideration of ICD implantation.   As you know, this is a 55 year old woman who is 9 years status post breast  cancer surgery with left mastectomy and chemotherapy. She took Adriamycin at  that time.   In January or February of this year, she presented with tachy palpitations  and congestive heart failure and was found to have an ejection fraction  about 25%. This is presumed to be nonischemic, and I presumed this was  clarified by CT scanning, though I do not have these data. Unfortunately,  while her symptoms improved initially, they have somewhat regressed, and her  LV function is also not improved, so that she has persistent ejection  fraction in the 25% range.   Her current complaints are currently of nocturnal dyspnea and orthopnea. She  does not have peripheral edema. She has significant dyspnea on exertion so  that she is unable to walk a flight of stairs or 50 yards.   Tachy palpitations were noted previously. They typically lasted seconds and  were associated with presyncope. There have been no sustained episodes and  no syncope.   Upward titration of her medications has been further limited by hypotension.   In addition, you undertook tissue Doppler imaging which demonstrates  significant dyssynchrony (qualitatively), the specific numbers of which are  not available.   CURRENT MEDICATIONS:  Her current medications include:  1.  Coreg 6.25.  2.  Lasix 40 b.i.d.  3.  Aldactone 25.  4.  Cozaar 25.  5.  Allegra.   ALLERGIES:  She has no known drug allergies.   REVIEW OF SYSTEMS:  Her review of systems in addition to the above is  notable for:  1.  GE reflux disease.  2.  Fatigue.  3.  Allergies.   SOCIAL HISTORY:   She is married. She has 2 children, 43 year old twins, 2  grandchildren. 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   She does not use cigarettes, alcohol or recreational drugs.   PHYSICAL EXAMINATION:  VITAL SIGNS:  On examination today, her blood  pressure is strikingly elevated for her at 109/60, her pulse is 80.  HEENT:  Demonstrated no icterus or xanthomata.  NECK:  Veins were flat. Carotids were brisk and full bilaterally without  bruits.  BACK:  The back was without kyphosis or scoliosis.  LUNGS:  Clear.  HEART:  Sounds were distant.  BREASTS:  The left breast was gone.  ABDOMEN:  The abdomen was soft with active bowel sounds without midline  pulsation or hepatomegaly.  EXTREMITIES:  Femoral pulses were 2+. Distal pulses were intact. There is no  clubbing, cyanosis, or edema.  NEUROLOGICAL:  Grossly normal.  SKIN:  Warm and dry.   DATA REVIEWED:  Electrocardiogram dated today demonstrated sinus rhythm at  85 with intervals of 0.16/0.07/0.38. The electrocardiogram was otherwise  normal.   IMPRESSION:  1.  Nonischemic cardiomyopathy, presumed to be Adriamycin related but      possibly post viral.  2.  Class III congestive failure.  3.  Narrow QRS  with abnormal tissue Doppler.  4.  Status post left mastectomy.  5.  Status post right PICC line.   [name], I think [name] would benefit both from ICD implantation for  sudden death prevention as well as CRT utilization for her congestive  symptoms as well as risk reduction of sudden death based on the CARE HF  data. Unfortunately, her medicines are maximally up titrated at the present  time given her blood pressure; maybe there will be some further room for up  titration if she turns out to be a responder as I would hope would be the  case given her TEI.   The biggest issue is where to put the device. I will need to  talk to some of  the general surgeons given her left mastectomy. I wonder given the use of  central veins whether we can utilize her left side. In the event that we  cannot, we will need to do a venogram on the right side to make sure there  is no venous obstruction related to the PICC line which might alter our  direction.   I have reviewed these issues with the patient and her husband. We reviewed  the potential benefits as well as potential risks including but not limited  to death, perforation of both the heart or the lung, infection, lead  dislodgement. They understand these risks and would like to proceed.   Thanks very much again for the consultation.    Sincerely,      Natasha Sprang, MD, [name]   SCK/MedQ  DD:  04/07/2006  DT:  04/07/2006  Job #:  [id number]   CC:    [name], M.D.

## 2011-02-03 ENCOUNTER — Encounter: Payer: Self-pay | Admitting: Internal Medicine

## 2011-02-10 ENCOUNTER — Ambulatory Visit (INDEPENDENT_AMBULATORY_CARE_PROVIDER_SITE_OTHER): Payer: Medicare PPO | Admitting: Internal Medicine

## 2011-02-10 ENCOUNTER — Encounter: Payer: Self-pay | Admitting: Internal Medicine

## 2011-02-10 VITALS — BP 108/70 | HR 74 | Resp 14 | Ht 61.0 in | Wt 187.0 lb

## 2011-02-10 DIAGNOSIS — I5022 Chronic systolic (congestive) heart failure: Secondary | ICD-10-CM

## 2011-02-10 DIAGNOSIS — I472 Ventricular tachycardia: Secondary | ICD-10-CM

## 2011-02-10 NOTE — Progress Notes (Signed)
HPI:  [name] is a 55 y/o woman with history of breast CA in 1998 treated with Adriamycin x 4 cycles. In 2007, diagnosed with CHF with EF 25-35%. She is s/p Medtronic BiV-ICD in 2008 with Sprint Fidelis lead. Never had cath. Nuke study in 2007 normal perfusion by report. Echo 5/11 with EF up to 50-55%. EF by cath 12/10 was 50%.  Echo 5/11: EF 50-55%   Had cath 12/10. Coronaries normal. Right atrial pressure mean of 7, RV pressure 28/6 . Pulmonary artery pressure was 23/9 with mean of 15.  Pulmonary capillary wedge pressure mean of 9. Fick cardiac output 3.9 L per minute.  Cardiac index 2.2 L per minute per meter squared.  Pulmonary vascular resistance was 1.5 Woods units.  Returns for routine follow-up. Doing quite well. Riding a bike several days per week. Gets SOB with moderate exertion no change. PCP checked BNP and it was low. No orthopnea or PND or edema.  No problems with meds. ICD has not fired.   ROS: All systems negative except as listed in HPI, PMH and Problem List.  Past Medical History  Diagnosis Date  . CHF (congestive heart failure)     due to non-ischemic CM (possible anthracycline toxicity) - dx'd 2007 (EF 25%); cath 12/10. Coronaries normal. LVEF 50%; Right atrial pressure mean of 7, RV pressure 28/6 . Pulmonary artery pressure was 23/9 with mean of  15.  Pulmonary capillary wedge pressure mean of 9. Fick cardiac output 3.9 L per minute.  Cardiac index  2.2 L per minute per meter squared.  Pulmonary vascular resistance was 1.5  . Breast cancer     s/p mastectomy and chemo with adriamycin (1998)  . Obesity   . NSVT (nonsustained ventricular tachycardia)     Current Outpatient Prescriptions  Medication Sig Dispense Refill  . carvedilol (COREG) 12.5 MG tablet Take 12.5 mg by mouth 2 (two) times daily with a meal.       . citalopram (CELEXA) 40 MG tablet Take 40 mg by mouth daily.        . digoxin (LANOXIN) 0.125 MG tablet Take 125 mcg by mouth daily.        . furosemide (LASIX)  40 MG tablet Take 1 1/2 tablet in AM and 1 tablet PM       . losartan (COZAAR) 50 MG tablet Take 50 mg by mouth daily.        . multivitamin (THERAGRAN) per tablet Take 1 tablet by mouth daily.        Marland Kitchen omeprazole (PRILOSEC) 20 MG capsule as needed.        . potassium chloride SA (K-DUR,KLOR-CON) 20 MEQ tablet Take 20 mEq by mouth daily.        . simvastatin (ZOCOR) 40 MG tablet Take 1 tablet (40 mg total) by mouth every evening.  30 tablet  11  . spironolactone (ALDACTONE) 25 MG tablet Take 1/2 tablet by mouth once a day       . zolpidem (AMBIEN) 10 MG tablet Take 10 mg by mouth at bedtime as needed.        Marland Kitchen DISCONTD: Cranberry (AZO-CRANBERRY) 450 MG TABS Take by mouth as needed.           PHYSICAL EXAM: Filed Vitals:   02/10/11 0828  BP: 108/70  Pulse: 74  Resp: 14   General:  Well appearing. No resp difficulty HEENT: normal Neck: supple. JVP flat. Carotids 2+ bilaterally; no bruits. No lymphadenopathy or thryomegaly appreciated. Cor: PMI  normal. Regular rate & rhythm. No rubs, gallops or murmurs. Lungs: clear Abdomen: soft, nontender, nondistended. No hepatosplenomegaly. No bruits or masses. Good bowel sounds. Extremities: no cyanosis, clubbing, rash, edema Neuro: alert & orientedx3, cranial nerves grossly intact. Moves all 4 extremities w/o difficulty. Affect pleasant.    ECG: Sinus rhythm with v-pacing 74   ASSESSMENT & PLAN:

## 2011-02-10 NOTE — Assessment & Plan Note (Signed)
Doing very well NYHA Class II with recovered EF. Volume status looks great. Continue current regimen as she had a hard time tolerating higher doses. Due for repeat echo.

## 2011-02-10 NOTE — Patient Instructions (Signed)
You are being scheduled for a 2 D Echo.  You will be called with the results Please follow up with Dr Haroldine Laws in 6 months. Continue all current medications and treatment.

## 2011-02-12 ENCOUNTER — Other Ambulatory Visit: Payer: Self-pay

## 2011-02-12 ENCOUNTER — Ambulatory Visit (INDEPENDENT_AMBULATORY_CARE_PROVIDER_SITE_OTHER): Payer: Medicare PPO | Admitting: *Deleted

## 2011-02-12 DIAGNOSIS — I5022 Chronic systolic (congestive) heart failure: Secondary | ICD-10-CM

## 2011-02-12 DIAGNOSIS — I472 Ventricular tachycardia: Secondary | ICD-10-CM

## 2011-02-12 DIAGNOSIS — I428 Other cardiomyopathies: Secondary | ICD-10-CM

## 2011-02-19 ENCOUNTER — Ambulatory Visit (HOSPITAL_COMMUNITY): Payer: Medicare PPO | Attending: Family Medicine

## 2011-02-19 DIAGNOSIS — R0609 Other forms of dyspnea: Secondary | ICD-10-CM | POA: Insufficient documentation

## 2011-02-19 DIAGNOSIS — E669 Obesity, unspecified: Secondary | ICD-10-CM | POA: Insufficient documentation

## 2011-02-19 DIAGNOSIS — I5022 Chronic systolic (congestive) heart failure: Secondary | ICD-10-CM

## 2011-02-19 DIAGNOSIS — I379 Nonrheumatic pulmonary valve disorder, unspecified: Secondary | ICD-10-CM | POA: Insufficient documentation

## 2011-02-19 DIAGNOSIS — R0989 Other specified symptoms and signs involving the circulatory and respiratory systems: Secondary | ICD-10-CM | POA: Insufficient documentation

## 2011-02-19 DIAGNOSIS — I509 Heart failure, unspecified: Secondary | ICD-10-CM | POA: Insufficient documentation

## 2011-02-19 DIAGNOSIS — I059 Rheumatic mitral valve disease, unspecified: Secondary | ICD-10-CM | POA: Insufficient documentation

## 2011-02-19 DIAGNOSIS — I079 Rheumatic tricuspid valve disease, unspecified: Secondary | ICD-10-CM | POA: Insufficient documentation

## 2011-02-19 NOTE — Progress Notes (Signed)
icd remote check w/icm

## 2011-02-20 ENCOUNTER — Encounter: Payer: Self-pay | Admitting: *Deleted

## 2011-02-24 ENCOUNTER — Telehealth: Payer: Self-pay | Admitting: Internal Medicine

## 2011-02-24 NOTE — Telephone Encounter (Signed)
Test results

## 2011-02-25 NOTE — Telephone Encounter (Signed)
Pt given echo results 

## 2011-05-21 ENCOUNTER — Ambulatory Visit (INDEPENDENT_AMBULATORY_CARE_PROVIDER_SITE_OTHER): Payer: Medicare PPO | Admitting: *Deleted

## 2011-05-21 ENCOUNTER — Encounter: Payer: Self-pay | Admitting: Internal Medicine

## 2011-05-21 ENCOUNTER — Other Ambulatory Visit: Payer: Self-pay | Admitting: Internal Medicine

## 2011-05-21 DIAGNOSIS — I5023 Acute on chronic systolic (congestive) heart failure: Secondary | ICD-10-CM

## 2011-05-21 DIAGNOSIS — I428 Other cardiomyopathies: Secondary | ICD-10-CM

## 2011-05-21 DIAGNOSIS — I472 Ventricular tachycardia: Secondary | ICD-10-CM

## 2011-05-24 LAB — REMOTE ICD DEVICE
AL AMPLITUDE: 2.2 mv
AL AMPLITUDE: 2.2 mv
ATRIAL PACING ICD: 11.9 pct
BAMS-0001: 170 {beats}/min
BATTERY VOLTAGE: 2.65 V
BRDY-0002LV: 60 {beats}/min
BRDY-0002LV: 60 {beats}/min
BRDY-0003LV: 130 {beats}/min
BRDY-0004LV: 120 {beats}/min
CHARGE TIME: 11.19 s
FVT: 0
LV LEAD IMPEDENCE ICD: 608 Ohm
LV LEAD THRESHOLD: 1.5 V
PACEART VT: 0
RV LEAD IMPEDENCE ICD: 776 Ohm
RV LEAD IMPEDENCE ICD: 776 Ohm
TOT-0001: 2
TOT-0002: 0
TOT-0006: 20070821000000
TZAT-0001ATACH: 1
TZAT-0001ATACH: 1
TZAT-0001ATACH: 2
TZAT-0001ATACH: 3
TZAT-0001FASTVT: 1
TZAT-0001FASTVT: 1
TZAT-0001SLOWVT: 1
TZAT-0001SLOWVT: 2
TZAT-0002ATACH: NEGATIVE
TZAT-0002FASTVT: NEGATIVE
TZAT-0004SLOWVT: 8
TZAT-0004SLOWVT: 8
TZAT-0004SLOWVT: 8
TZAT-0011SLOWVT: 10 ms
TZAT-0011SLOWVT: 10 ms
TZAT-0012ATACH: 150 ms
TZAT-0012ATACH: 150 ms
TZAT-0012ATACH: 150 ms
TZAT-0012ATACH: 150 ms
TZAT-0012FASTVT: 200 ms
TZAT-0012SLOWVT: 200 ms
TZAT-0012SLOWVT: 200 ms
TZAT-0013SLOWVT: 3
TZAT-0013SLOWVT: 3
TZAT-0013SLOWVT: 3
TZAT-0013SLOWVT: 3
TZAT-0018ATACH: NEGATIVE
TZAT-0018ATACH: NEGATIVE
TZAT-0018ATACH: NEGATIVE
TZAT-0018ATACH: NEGATIVE
TZAT-0018ATACH: NEGATIVE
TZAT-0018FASTVT: NEGATIVE
TZAT-0018SLOWVT: NEGATIVE
TZAT-0018SLOWVT: NEGATIVE
TZAT-0019ATACH: 6 V
TZAT-0019FASTVT: 8 V
TZAT-0020ATACH: 1.5 ms
TZAT-0020ATACH: 1.5 ms
TZAT-0020ATACH: 1.5 ms
TZAT-0020ATACH: 1.5 ms
TZAT-0020FASTVT: 1.5 ms
TZAT-0020SLOWVT: 1.5 ms
TZAT-0020SLOWVT: 1.5 ms
TZAT-0020SLOWVT: 1.5 ms
TZON-0003ATACH: 350 ms
TZON-0003SLOWVT: 340 ms
TZON-0003VSLOWVT: 450 ms
TZON-0003VSLOWVT: 450 ms
TZON-0004SLOWVT: 16
TZON-0004SLOWVT: 16
TZON-0004VSLOWVT: 20
TZST-0001ATACH: 4
TZST-0001ATACH: 6
TZST-0001FASTVT: 2
TZST-0001FASTVT: 4
TZST-0001FASTVT: 4
TZST-0001FASTVT: 5
TZST-0001FASTVT: 6
TZST-0001FASTVT: 6
TZST-0001SLOWVT: 3
TZST-0001SLOWVT: 4
TZST-0001SLOWVT: 5
TZST-0001SLOWVT: 6
TZST-0002ATACH: NEGATIVE
TZST-0002ATACH: NEGATIVE
TZST-0002ATACH: NEGATIVE
TZST-0002ATACH: NEGATIVE
TZST-0002FASTVT: NEGATIVE
TZST-0002FASTVT: NEGATIVE
TZST-0002FASTVT: NEGATIVE
TZST-0002FASTVT: NEGATIVE
TZST-0002FASTVT: NEGATIVE
TZST-0002FASTVT: NEGATIVE
TZST-0003SLOWVT: 15 J
TZST-0003SLOWVT: 25 J
TZST-0003SLOWVT: 25 J
TZST-0003SLOWVT: 35 J
VENTRICULAR PACING ICD: 99.98 pct
VF: 0
VF: 0

## 2011-05-27 ENCOUNTER — Encounter: Payer: Self-pay | Admitting: *Deleted

## 2011-06-05 NOTE — Progress Notes (Signed)
ICD/ICM checked by remote.

## 2011-06-25 ENCOUNTER — Ambulatory Visit (INDEPENDENT_AMBULATORY_CARE_PROVIDER_SITE_OTHER): Payer: Medicare PPO | Admitting: *Deleted

## 2011-06-25 ENCOUNTER — Encounter: Payer: Self-pay | Admitting: Internal Medicine

## 2011-06-25 ENCOUNTER — Other Ambulatory Visit: Payer: Self-pay | Admitting: Internal Medicine

## 2011-06-25 DIAGNOSIS — I472 Ventricular tachycardia: Secondary | ICD-10-CM

## 2011-06-25 DIAGNOSIS — I5022 Chronic systolic (congestive) heart failure: Secondary | ICD-10-CM

## 2011-06-25 DIAGNOSIS — I428 Other cardiomyopathies: Secondary | ICD-10-CM

## 2011-06-28 LAB — REMOTE ICD DEVICE
AL IMPEDENCE ICD: 368 Ohm
ATRIAL PACING ICD: 8.78 pct
BAMS-0001: 170 {beats}/min
BATTERY VOLTAGE: 2.64 V
LV LEAD IMPEDENCE ICD: 600 Ohm
TOT-0001: 2
TOT-0002: 0
TOT-0006: 20070821000000
TZAT-0001ATACH: 2
TZAT-0001ATACH: 3
TZAT-0004SLOWVT: 8
TZAT-0004SLOWVT: 8
TZAT-0012ATACH: 150 ms
TZAT-0012ATACH: 150 ms
TZAT-0012FASTVT: 200 ms
TZAT-0012SLOWVT: 200 ms
TZAT-0012SLOWVT: 200 ms
TZAT-0018ATACH: NEGATIVE
TZAT-0018FASTVT: NEGATIVE
TZAT-0019ATACH: 6 V
TZAT-0020ATACH: 1.5 ms
TZAT-0020ATACH: 1.5 ms
TZAT-0020ATACH: 1.5 ms
TZAT-0020FASTVT: 1.5 ms
TZAT-0020SLOWVT: 1.5 ms
TZAT-0020SLOWVT: 1.5 ms
TZON-0003ATACH: 350 ms
TZON-0003SLOWVT: 340 ms
TZON-0003VSLOWVT: 450 ms
TZON-0004VSLOWVT: 20
TZST-0001ATACH: 4
TZST-0001ATACH: 6
TZST-0001FASTVT: 2
TZST-0001FASTVT: 4
TZST-0001FASTVT: 5
TZST-0001FASTVT: 6
TZST-0001SLOWVT: 3
TZST-0001SLOWVT: 5
TZST-0002ATACH: NEGATIVE
TZST-0002FASTVT: NEGATIVE
TZST-0003SLOWVT: 15 J
TZST-0003SLOWVT: 35 J
VENTRICULAR PACING ICD: 99.99 pct

## 2011-07-07 ENCOUNTER — Encounter: Payer: Self-pay | Admitting: *Deleted

## 2011-07-07 NOTE — Progress Notes (Signed)
icd remote w/icm  

## 2011-08-10 ENCOUNTER — Ambulatory Visit (HOSPITAL_COMMUNITY)
Admission: RE | Admit: 2011-08-10 | Discharge: 2011-08-10 | Disposition: A | Discharge status: Home / Self Care | Payer: Medicare PPO | Source: Ambulatory Visit | Attending: Internal Medicine | Admitting: Internal Medicine

## 2011-08-10 VITALS — BP 98/54 | HR 82 | Wt 188.0 lb

## 2011-08-10 DIAGNOSIS — I5022 Chronic systolic (congestive) heart failure: Secondary | ICD-10-CM | POA: Insufficient documentation

## 2011-08-10 NOTE — Assessment & Plan Note (Addendum)
Continues to do well. NYHA II. Volume status stable. EF recovered. Does have minimal volume on board today and discussed use of sliding scale lasix as needed. Follow up in 3 months with repeat echo to make sure EF stable.  Patient seen and examined with Darrick Grinder, NP. We discussed all aspects of the encounter. I agree with the assessment and plan as stated above.

## 2011-08-10 NOTE — Patient Instructions (Signed)
Follow up in 3-4 months  Do the following things EVERYDAY: 1) Weigh yourself in the morning before breakfast. Write it down and keep it in a log. 2) Take your medicines as prescribed 3) Eat low salt foods-Limit salt (sodium) to 2000mg  per day.  4) Stay as active as you can everyday

## 2011-08-10 NOTE — Progress Notes (Signed)
Patient ID: [name], female   DOB: 10/05/1955, 55 y.o.   MRN: [id number] HPI:  [name] is a 55 y/o woman with history of breast CA in 1998 treated with Adriamycin x 4 cycles. In 2007, diagnosed with CHF with EF 25-35%. She is s/p Medtronic BiV-ICD in 2008 with Sprint Fidelis lead. Never had cath. Nuke study in 2007 normal perfusion by report. Echo 5/11 with EF up to 50-55%. EF by cath 12/10 was 50%.  Echo 5/11: EF 50-55%   Had cath 12/10. Coronaries normal. Right atrial pressure mean of 7, RV pressure 28/6 . Pulmonary artery pressure was 23/9 with mean of 15.  Pulmonary capillary wedge pressure mean of 9. Fick cardiac output 3.9 L per minute.  Cardiac index 2.2 L per minute per meter squared.  Pulmonary vascular resistance was 1.5 Woods units.  04-2011 Creatinine 1.2 Uric Acid 7.1 potassium 4.8   Returns for routine follow-up. Denies PND/Orthopnea. SOB vacuuming and walking stairs.  Dizziness occasionally. Sleeps on one pillow. Weight at home 185- 189 pounds. She has not required extra lasix. Continues to ride a bike 3 times week. No ICD firing. First gout attack July 2012 and she started Allopurinol.   ROS: All systems negative except as listed in HPI, PMH and Problem List.  Past Medical History  Diagnosis Date  . CHF (congestive heart failure)     due to non-ischemic CM (possible anthracycline toxicity) - dx'd 2007 (EF 25%); cath 12/10. Coronaries normal. LVEF 50%; Right atrial pressure mean of 7, RV pressure 28/6 . Pulmonary artery pressure was 23/9 with mean of  15.  Pulmonary capillary wedge pressure mean of 9. Fick cardiac output 3.9 L per minute.  Cardiac index  2.2 L per minute per meter squared.  Pulmonary vascular resistance was 1.5  . Breast cancer     s/p mastectomy and chemo with adriamycin (1998)  . Obesity   . NSVT (nonsustained ventricular tachycardia)     Current Outpatient Prescriptions  Medication Sig Dispense Refill  . allopurinol (ZYLOPRIM) 300 MG tablet Take  300 mg by mouth daily.        . carvedilol (COREG) 12.5 MG tablet Take 12.5 mg by mouth 2 (two) times daily with a meal.       . citalopram (CELEXA) 40 MG tablet Take 40 mg by mouth daily.        . digoxin (LANOXIN) 0.125 MG tablet Take 125 mcg by mouth daily.        . furosemide (LASIX) 40 MG tablet Take 1 1/2 tablet in AM and 1 tablet PM       . losartan (COZAAR) 50 MG tablet Take 50 mg by mouth daily.        . multivitamin (THERAGRAN) per tablet Take 1 tablet by mouth daily.        Marland Kitchen omeprazole (PRILOSEC) 20 MG capsule as needed.        . potassium chloride SA (K-DUR,KLOR-CON) 20 MEQ tablet Take 10 mEq by mouth daily.       . simvastatin (ZOCOR) 40 MG tablet Take 1 tablet (40 mg total) by mouth every evening.  30 tablet  11  . spironolactone (ALDACTONE) 25 MG tablet Take 25 mg by mouth daily.       Marland Kitchen zolpidem (AMBIEN) 10 MG tablet Take 10 mg by mouth at bedtime as needed.           PHYSICAL EXAM: Filed Vitals:   08/10/11 0948  BP: 98/54  Pulse: 82  General:  Well appearing. No resp difficulty HEENT: normal Neck: supple. JVP flat. Carotids 2+ bilaterally; no bruits. No lymphadenopathy or thryomegaly appreciated. Cor: PMI normal. Regular rate & rhythm. No rubs, gallops or murmurs. Lungs: clear Abdomen: soft, nontender, nondistended. No hepatosplenomegaly. No bruits or masses. Good bowel sounds. Extremities: no cyanosis, clubbing, rash, edema Neuro: alert & orientedx3, cranial nerves grossly intact. Moves all 4 extremities w/o difficulty. Affect pleasant.      ASSESSMENT & PLAN:

## 2011-08-18 ENCOUNTER — Encounter: Payer: Self-pay | Admitting: Internal Medicine

## 2011-08-18 ENCOUNTER — Ambulatory Visit (INDEPENDENT_AMBULATORY_CARE_PROVIDER_SITE_OTHER): Payer: Medicare PPO | Admitting: Internal Medicine

## 2011-08-18 DIAGNOSIS — I428 Other cardiomyopathies: Secondary | ICD-10-CM

## 2011-08-18 DIAGNOSIS — I4729 Other ventricular tachycardia: Secondary | ICD-10-CM | POA: Insufficient documentation

## 2011-08-18 DIAGNOSIS — I5022 Chronic systolic (congestive) heart failure: Secondary | ICD-10-CM

## 2011-08-18 DIAGNOSIS — T82198A Other mechanical complication of other cardiac electronic device, initial encounter: Secondary | ICD-10-CM

## 2011-08-18 DIAGNOSIS — Z95810 Presence of automatic (implantable) cardiac defibrillator: Secondary | ICD-10-CM

## 2011-08-18 DIAGNOSIS — T829XXA Unspecified complication of cardiac and vascular prosthetic device, implant and graft, initial encounter: Secondary | ICD-10-CM | POA: Insufficient documentation

## 2011-08-18 DIAGNOSIS — I472 Ventricular tachycardia: Secondary | ICD-10-CM

## 2011-08-18 LAB — ICD DEVICE OBSERVATION
AL AMPLITUDE: 4.0533 mv
AL IMPEDENCE ICD: 384 Ohm
AL THRESHOLD: 0.5 V
BATTERY VOLTAGE: 2.64 V
HV IMPEDENCE: 49 Ohm
LV LEAD IMPEDENCE ICD: 576 Ohm
LV LEAD THRESHOLD: 1 V
RV LEAD AMPLITUDE: 12.9606 mv
TOT-0002: 0
TOT-0006: 20070821000000
TZAT-0001ATACH: 1
TZAT-0001FASTVT: 1
TZAT-0001SLOWVT: 1
TZAT-0001SLOWVT: 2
TZAT-0002ATACH: NEGATIVE
TZAT-0002ATACH: NEGATIVE
TZAT-0002FASTVT: NEGATIVE
TZAT-0004SLOWVT: 8
TZAT-0004SLOWVT: 8
TZAT-0012ATACH: 150 ms
TZAT-0012FASTVT: 200 ms
TZAT-0013SLOWVT: 3
TZAT-0013SLOWVT: 3
TZAT-0018FASTVT: NEGATIVE
TZAT-0018SLOWVT: NEGATIVE
TZAT-0018SLOWVT: NEGATIVE
TZAT-0019ATACH: 6 V
TZAT-0019ATACH: 6 V
TZAT-0019ATACH: 6 V
TZAT-0019FASTVT: 8 V
TZAT-0020ATACH: 1.5 ms
TZAT-0020ATACH: 1.5 ms
TZAT-0020SLOWVT: 1.5 ms
TZAT-0020SLOWVT: 1.5 ms
TZON-0003ATACH: 350 ms
TZON-0003VSLOWVT: 450 ms
TZON-0004SLOWVT: 16
TZON-0004VSLOWVT: 20
TZST-0001ATACH: 4
TZST-0001ATACH: 5
TZST-0001FASTVT: 4
TZST-0001FASTVT: 5
TZST-0001FASTVT: 6
TZST-0001SLOWVT: 3
TZST-0001SLOWVT: 5
TZST-0002ATACH: NEGATIVE
TZST-0002FASTVT: NEGATIVE
TZST-0002FASTVT: NEGATIVE
TZST-0002FASTVT: NEGATIVE
TZST-0003SLOWVT: 25 J
VF: 0

## 2011-08-18 NOTE — Patient Instructions (Addendum)
Remote monitoring is used to monitor your Pacemaker of ICD from home. This monitoring reduces the number of office visits required to check your device to one time per year. It allows [place] to keep an eye on the functioning of your device to ensure it is working properly. You are scheduled for a device check from home on November 19, 2010. You may send your transmission at any time that day. If you have a wireless device, the transmission will be sent automatically. After your physician reviews your transmission, you will receive a postcard with your next transmission date.  Your physician wants you to follow-up in: 1 year with Dr Caryl Comes.  You will receive a reminder letter in the mail two months in advance. If you don't receive a letter, please call our office to schedule the follow-up appointment.  Your physician recommends that you continue on your current medications as directed. Please refer to the Current Medication list given to you today.

## 2011-08-18 NOTE — Assessment & Plan Note (Signed)
Much improved

## 2011-08-18 NOTE — Progress Notes (Signed)
HPI  [name] is a 55 y.o. female is seen today in follow-up for congestive heart failure in the setting of nonischemic cardiomyopathy. She is status post CRTD implantation about 4 years ago.She has a sprint  Fidelis lead   The patient denies chest pain, edema or palpitations. She is recovering from the flu  She underwent catheterization 12/10 ,  coronaries were normal and  filling pressures were normal       Nuke study in 2007 normal perfusion by report. Echo 5/12 EF remains in the 55-60% range   Medical history notablefor  breast CA in 1998 treated with Adriamycin x 4 cycles.  Past Medical History  Diagnosis Date  . CHF (congestive heart failure)     due to non-ischemic CM (possible anthracycline toxicity) - dx'd 2007 (EF 25%); cath 12/10. Coronaries normal. LVEF 50%; Right atrial pressure mean of 7, RV pressure 28/6 . Pulmonary artery pressure was 23/9 with mean of  15.  Pulmonary capillary wedge pressure mean of 9. Fick cardiac output 3.9 L per minute.  Cardiac index  2.2 L per minute per meter squared.  Pulmonary vascular resistance was 1.5  . Breast cancer     s/p mastectomy and chemo with adriamycin (1998)  . Obesity   . NSVT (nonsustained ventricular tachycardia)     Past Surgical History  Procedure Date  . Biv icd     Medtronic    Current Outpatient Prescriptions  Medication Sig Dispense Refill  . allopurinol (ZYLOPRIM) 300 MG tablet Take 300 mg by mouth daily.        Marland Kitchen azithromycin (ZITHROMAX) 250 MG tablet 250 mg.       . benzonatate (TESSALON) 100 MG capsule Take 100 mg by mouth 3 (three) times daily as needed.        . carvedilol (COREG) 12.5 MG tablet Take 12.5 mg by mouth 2 (two) times daily with a meal.       . citalopram (CELEXA) 40 MG tablet Take 40 mg by mouth daily.        . digoxin (LANOXIN) 0.125 MG tablet Take 125 mcg by mouth daily.        . fluticasone (FLONASE) 50 MCG/ACT nasal spray       . furosemide (LASIX) 40 MG tablet Take 1 1/2 tablet  in AM and 1 tablet PM       . losartan (COZAAR) 50 MG tablet Take 50 mg by mouth daily.        . multivitamin (THERAGRAN) per tablet Take 1 tablet by mouth daily.        Marland Kitchen omeprazole (PRILOSEC) 20 MG capsule as needed.        . potassium chloride SA (K-DUR,KLOR-CON) 20 MEQ tablet Take 10 mEq by mouth daily.       . predniSONE (STERAPRED UNI-PAK) 10 MG tablet       . simvastatin (ZOCOR) 40 MG tablet Take 1 tablet (40 mg total) by mouth every evening.  30 tablet  11  . spironolactone (ALDACTONE) 25 MG tablet Take 25 mg by mouth daily.       Marland Kitchen zolpidem (AMBIEN) 10 MG tablet Take 10 mg by mouth at bedtime as needed.          No Known Allergies  Review of Systems negative except from HPI and PMH  Physical Exam Well developed and well nourished in no acute distress HENT normal E scleral and icterus clear Neck Supple JVP flat; carotids brisk and full Clear to  ausculation Regular rate and rhythm, no murmurs gallops or rub Soft with active bowel sounds No clubbing cyanosis none Edema Alert and oriented, grossly normal motor and sensory function Skin Warm and Dry   Assessment and  Plan

## 2011-08-18 NOTE — Assessment & Plan Note (Signed)
No significant recurrent VT

## 2011-08-18 NOTE — Assessment & Plan Note (Signed)
We reviewed the issues related to lead fracture; and would anticipate a generator change out if the vein were patent To insert a new ICD lead and if it were occluded to switch the leads in the header,

## 2011-08-18 NOTE — Assessment & Plan Note (Signed)
The patient's device was interrogated.  The information was reviewed. No changes were made in the programming.    

## 2011-09-30 ENCOUNTER — Telehealth (HOSPITAL_COMMUNITY): Payer: Self-pay | Admitting: *Deleted

## 2011-09-30 MED ORDER — CARVEDILOL 12.5 MG PO TABS: 12.5000 mg | ORAL_TABLET | Freq: Two times a day (BID) | ORAL | Status: DC

## 2011-09-30 MED ORDER — SIMVASTATIN 40 MG PO TABS: 40.0000 mg | ORAL_TABLET | Freq: Every evening | ORAL | Status: DC

## 2011-09-30 MED ORDER — POTASSIUM CHLORIDE CRYS ER 20 MEQ PO TBCR: 10.0000 meq | EXTENDED_RELEASE_TABLET | Freq: Every day | ORAL | Status: DC

## 2011-09-30 MED ORDER — FUROSEMIDE 40 MG PO TABS: ORAL_TABLET | ORAL | Status: DC

## 2011-09-30 MED ORDER — DIGOXIN 125 MCG PO TABS: 125.0000 ug | ORAL_TABLET | Freq: Every day | ORAL | Status: DC

## 2011-09-30 MED ORDER — SPIRONOLACTONE 25 MG PO TABS: 25.0000 mg | ORAL_TABLET | Freq: Every day | ORAL | Status: DC

## 2011-09-30 MED ORDER — LOSARTAN POTASSIUM 50 MG PO TABS: 50.0000 mg | ORAL_TABLET | Freq: Every day | ORAL | Status: DC

## 2011-09-30 NOTE — Telephone Encounter (Signed)
Pt aware prescriptions were sent in

## 2011-09-30 NOTE — Telephone Encounter (Signed)
Ms Sanguino called this am, she needs all of her meds to be called in to [facility] for refills.  Thanks.

## 2011-11-19 ENCOUNTER — Ambulatory Visit (INDEPENDENT_AMBULATORY_CARE_PROVIDER_SITE_OTHER): Payer: Medicare PPO | Admitting: *Deleted

## 2011-11-19 ENCOUNTER — Encounter: Payer: Self-pay | Admitting: Internal Medicine

## 2011-11-19 DIAGNOSIS — I428 Other cardiomyopathies: Secondary | ICD-10-CM

## 2011-11-19 DIAGNOSIS — Z95810 Presence of automatic (implantable) cardiac defibrillator: Secondary | ICD-10-CM

## 2011-11-19 DIAGNOSIS — I509 Heart failure, unspecified: Secondary | ICD-10-CM

## 2011-11-20 LAB — REMOTE ICD DEVICE
AL IMPEDENCE ICD: 368 Ohm
ATRIAL PACING ICD: 4.83 pct
BRDY-0003LV: 130 {beats}/min
BRDY-0004LV: 120 {beats}/min
CHARGE TIME: 12.171 s
LV LEAD THRESHOLD: 1.5 V
PACEART VT: 0
TOT-0001: 2
TOT-0002: 0
TZAT-0001ATACH: 2
TZAT-0001SLOWVT: 1
TZAT-0001SLOWVT: 2
TZAT-0002ATACH: NEGATIVE
TZAT-0002ATACH: NEGATIVE
TZAT-0002ATACH: NEGATIVE
TZAT-0002FASTVT: NEGATIVE
TZAT-0005SLOWVT: 88 pct
TZAT-0005SLOWVT: 91 pct
TZAT-0011SLOWVT: 10 ms
TZAT-0011SLOWVT: 10 ms
TZAT-0012ATACH: 150 ms
TZAT-0012FASTVT: 200 ms
TZAT-0018ATACH: NEGATIVE
TZAT-0018FASTVT: NEGATIVE
TZAT-0018SLOWVT: NEGATIVE
TZAT-0018SLOWVT: NEGATIVE
TZAT-0019ATACH: 6 V
TZAT-0019ATACH: 6 V
TZAT-0019ATACH: 6 V
TZAT-0019FASTVT: 8 V
TZAT-0019SLOWVT: 8 V
TZAT-0019SLOWVT: 8 V
TZAT-0020ATACH: 1.5 ms
TZAT-0020FASTVT: 1.5 ms
TZON-0003VSLOWVT: 450 ms
TZON-0004VSLOWVT: 20
TZON-0005SLOWVT: 12
TZST-0001ATACH: 5
TZST-0001FASTVT: 5
TZST-0001SLOWVT: 3
TZST-0002ATACH: NEGATIVE
TZST-0002FASTVT: NEGATIVE
TZST-0002FASTVT: NEGATIVE
TZST-0002FASTVT: NEGATIVE
TZST-0003SLOWVT: 15 J
TZST-0003SLOWVT: 35 J
VENTRICULAR PACING ICD: 99.93 pct

## 2011-11-26 ENCOUNTER — Ambulatory Visit (INDEPENDENT_AMBULATORY_CARE_PROVIDER_SITE_OTHER): Payer: Medicare PPO | Admitting: *Deleted

## 2011-11-26 ENCOUNTER — Encounter: Payer: Self-pay | Admitting: Internal Medicine

## 2011-11-26 DIAGNOSIS — Z95810 Presence of automatic (implantable) cardiac defibrillator: Secondary | ICD-10-CM

## 2011-11-26 DIAGNOSIS — I4729 Other ventricular tachycardia: Secondary | ICD-10-CM

## 2011-11-26 DIAGNOSIS — I428 Other cardiomyopathies: Secondary | ICD-10-CM

## 2011-11-26 DIAGNOSIS — I472 Ventricular tachycardia: Secondary | ICD-10-CM

## 2011-11-30 LAB — REMOTE ICD DEVICE
BAMS-0001: 170 {beats}/min
BATTERY VOLTAGE: 2.62 V
BRDY-0002LV: 60 {beats}/min
LV LEAD IMPEDENCE ICD: 584 Ohm
LV LEAD THRESHOLD: 1.5 V
TOT-0006: 20070821000000
TZAT-0001ATACH: 1
TZAT-0001ATACH: 3
TZAT-0001FASTVT: 1
TZAT-0002ATACH: NEGATIVE
TZAT-0011SLOWVT: 10 ms
TZAT-0011SLOWVT: 10 ms
TZAT-0012ATACH: 150 ms
TZAT-0012ATACH: 150 ms
TZAT-0012ATACH: 150 ms
TZAT-0012FASTVT: 200 ms
TZAT-0012SLOWVT: 200 ms
TZAT-0012SLOWVT: 200 ms
TZAT-0013SLOWVT: 3
TZAT-0013SLOWVT: 3
TZAT-0018ATACH: NEGATIVE
TZAT-0019ATACH: 6 V
TZAT-0019SLOWVT: 8 V
TZAT-0019SLOWVT: 8 V
TZAT-0020ATACH: 1.5 ms
TZAT-0020ATACH: 1.5 ms
TZAT-0020ATACH: 1.5 ms
TZAT-0020SLOWVT: 1.5 ms
TZAT-0020SLOWVT: 1.5 ms
TZON-0003ATACH: 350 ms
TZON-0003SLOWVT: 340 ms
TZON-0003VSLOWVT: 450 ms
TZON-0004SLOWVT: 16
TZON-0005SLOWVT: 12
TZST-0001ATACH: 4
TZST-0001ATACH: 5
TZST-0001ATACH: 6
TZST-0001FASTVT: 2
TZST-0001FASTVT: 3
TZST-0001FASTVT: 4
TZST-0001SLOWVT: 4
TZST-0001SLOWVT: 5
TZST-0002ATACH: NEGATIVE
TZST-0002ATACH: NEGATIVE
TZST-0002FASTVT: NEGATIVE
TZST-0002FASTVT: NEGATIVE
TZST-0003SLOWVT: 15 J
TZST-0003SLOWVT: 25 J
TZST-0003SLOWVT: 35 J
VENTRICULAR PACING ICD: 99.98 pct
VF: 0

## 2011-12-03 ENCOUNTER — Encounter: Payer: Medicare PPO | Admitting: Internal Medicine

## 2011-12-03 NOTE — Progress Notes (Signed)
Remote icd check w/icm  

## 2011-12-08 NOTE — Progress Notes (Signed)
ICD remote with ICM 

## 2011-12-11 ENCOUNTER — Encounter (HOSPITAL_COMMUNITY): Payer: Self-pay | Admitting: Pharmacy Technician

## 2011-12-11 ENCOUNTER — Encounter: Payer: Self-pay | Admitting: *Deleted

## 2011-12-11 ENCOUNTER — Other Ambulatory Visit: Payer: Self-pay | Admitting: Internal Medicine

## 2011-12-11 ENCOUNTER — Ambulatory Visit (INDEPENDENT_AMBULATORY_CARE_PROVIDER_SITE_OTHER): Payer: Medicare PPO | Admitting: Internal Medicine

## 2011-12-11 ENCOUNTER — Encounter: Payer: Self-pay | Admitting: Internal Medicine

## 2011-12-11 DIAGNOSIS — I428 Other cardiomyopathies: Secondary | ICD-10-CM

## 2011-12-11 DIAGNOSIS — Z95810 Presence of automatic (implantable) cardiac defibrillator: Secondary | ICD-10-CM

## 2011-12-11 DIAGNOSIS — T82198A Other mechanical complication of other cardiac electronic device, initial encounter: Secondary | ICD-10-CM

## 2011-12-11 DIAGNOSIS — Z01810 Encounter for preprocedural cardiovascular examination: Secondary | ICD-10-CM

## 2011-12-11 NOTE — Assessment & Plan Note (Addendum)
The patient's device was interrogated.  The information was reviewed. No changes were made in the programming.   GHer device has reached ERI and needs replacing

## 2011-12-11 NOTE — Progress Notes (Signed)
HPI  [name] is a 56 y.o. female seen today in follow-up for congestive heart failure in the setting of nonischemic cardiomyopathy. She is status post CRTD implantation about 4 years ago.She has a sprint  Fidelis lead   The patient denies chest pain, edema or palpitations. She is recovering from the flu  She underwent catheterization 12/10 ,  coronaries were normal and  filling pressures were normal       Nuke study in 2007 normal perfusion by report. Echo 5/12 EF remains in the 55-60% range   Medical history notable for  breast CA in 1998 treated with Adriamycin x 4 cycles.   The patient denies chest pain, shortness of breath, nocturnal dyspnea, orthopnea or peripheral edema.  There have been no palpitations, lightheadedness or syncope.     Past Medical History  Diagnosis Date  . CHF (congestive heart failure)     due to non-ischemic CM (possible anthracycline toxicity) - dx'd 2007 (EF 25%); cath 12/10. Coronaries normal. LVEF 50%; Right atrial pressure mean of 7, RV pressure 28/6 . Pulmonary artery pressure was 23/9 with mean of  15.  Pulmonary capillary wedge pressure mean of 9. Fick cardiac output 3.9 L per minute.  Cardiac index  2.2 L per minute per meter squared.  Pulmonary vascular resistance was 1.5  . Breast cancer     s/p mastectomy and chemo with adriamycin (1998)  . Obesity   . NSVT (nonsustained ventricular tachycardia)   . Biventricular implantable cardiac defibrillator)     Medtronic  . Nonischemic cardiomyopathy     EF 55% echo 2012  . sprint Fidelis     Past Surgical History  Procedure Date  . Biv icd     Medtronic    Current Outpatient Prescriptions  Medication Sig Dispense Refill  . allopurinol (ZYLOPRIM) 300 MG tablet Take 300 mg by mouth daily.        . B Complex-C (SUPER B COMPLEX PO) Take by mouth daily.      . carvedilol (COREG) 12.5 MG tablet Take 1 tablet (12.5 mg total) by mouth 2 (two) times daily with a meal.  180 tablet  3  .  citalopram (CELEXA) 40 MG tablet Take 40 mg by mouth daily.        . digoxin (LANOXIN) 0.125 MG tablet Take 1 tablet (125 mcg total) by mouth daily.  90 tablet  3  . fish oil-omega-3 fatty acids 1000 MG capsule Take 300 mg by mouth daily.      . furosemide (LASIX) 40 MG tablet Take 40 mg by mouth 2 (two) times daily. 2 tablets in the am and 2 tablets in the pm      . losartan (COZAAR) 50 MG tablet Take 1 tablet (50 mg total) by mouth daily.  90 tablet  3  . omeprazole (PRILOSEC) 20 MG capsule Take 20 mg by mouth as needed.       . potassium chloride SA (K-DUR,KLOR-CON) 20 MEQ tablet Take 0.5 tablets (10 mEq total) by mouth daily.  90 tablet  3  . simvastatin (ZOCOR) 40 MG tablet Take 1 tablet (40 mg total) by mouth every evening.  90 tablet  3  . spironolactone (ALDACTONE) 25 MG tablet Take 1 tablet (25 mg total) by mouth daily.  90 tablet  3  . zolpidem (AMBIEN) 10 MG tablet Take 10 mg by mouth at bedtime as needed.          No Known Allergies  Review of Systems  negative except from HPI and PMH  Physical Exam BP 110/76  Pulse 60  Ht 5' 1.5" (1.562 m)  Wt 191 lb 12.8 oz (87 kg)  BMI 35.65 kg/m2 Well developed and well nourished in no acute distress HENT normal E scleral and icterus clear Neck Supple JVP flat; carotids brisk and full Clear to ausculation Right sided implant *Regular rate and rhythm, no murmurs gallops or rub Soft with active bowel sounds No clubbing cyanosis no Edema Alert and oriented, grossly normal motor and sensory function Skin Warm and Dry   Assessment and  Plan

## 2011-12-11 NOTE — Patient Instructions (Signed)
Your physician has requested that you have an echocardiogram. Echocardiography is a painless test that uses sound waves to create images of your heart. It provides your doctor with information about the size and shape of your heart and how well your heart's chambers and valves are working. This procedure takes approximately one hour. There are no restrictions for this procedure.   GENERATOR CHANGE

## 2011-12-11 NOTE — Assessment & Plan Note (Signed)
We have had a lengthy discussion today regarding the implications of a [id number]. Her left ventricular function has recovered. This has been coincidental with resynchronization therapy using a bipolar LV lead. We have discussed the risks of removing the defibrillator lead and replacement, importance of which would be great her head she had therapy and/or her left ventricular function remains significantly impaired. Given however that those are not the case, I have suggested that we switch the bipolar LV lead for the RV rate sense portion and monitor the RV lead over time  she is agreeable.  We have reviewed the benefits and risks of generator replacement.  These include but are not limited to lead fracture and infection.  The patient understands, agrees and is willing to proceed.

## 2011-12-11 NOTE — Assessment & Plan Note (Signed)
We'll plan to repeat the echocardiogram generator replacement

## 2011-12-14 ENCOUNTER — Telehealth (HOSPITAL_COMMUNITY): Payer: Self-pay | Admitting: *Deleted

## 2011-12-14 NOTE — Telephone Encounter (Signed)
Ms [name] called today regarding her appt next Tuesday.  She is scheduled to come to the hospital on Wednesday for a battery change on her pacemaker and wants to know if she needs to still come to this appt. Thanks.

## 2011-12-14 NOTE — Telephone Encounter (Signed)
Spoke w/pt offered to move appt out she will just keep appt 4/16

## 2011-12-15 ENCOUNTER — Ambulatory Visit (HOSPITAL_BASED_OUTPATIENT_CLINIC_OR_DEPARTMENT_OTHER): Payer: Medicare PPO

## 2011-12-15 ENCOUNTER — Other Ambulatory Visit (INDEPENDENT_AMBULATORY_CARE_PROVIDER_SITE_OTHER): Payer: Medicare PPO

## 2011-12-15 ENCOUNTER — Other Ambulatory Visit: Payer: Self-pay

## 2011-12-15 ENCOUNTER — Other Ambulatory Visit: Payer: Medicare PPO

## 2011-12-15 ENCOUNTER — Ambulatory Visit (HOSPITAL_COMMUNITY)
Admission: RE | Admit: 2011-12-15 | Discharge: 2011-12-16 | Disposition: A | Discharge status: Home / Self Care | Payer: Medicare PPO | Source: Ambulatory Visit | Attending: Internal Medicine | Admitting: Internal Medicine

## 2011-12-15 DIAGNOSIS — Z4502 Encounter for adjustment and management of automatic implantable cardiac defibrillator: Secondary | ICD-10-CM | POA: Insufficient documentation

## 2011-12-15 DIAGNOSIS — Z95810 Presence of automatic (implantable) cardiac defibrillator: Secondary | ICD-10-CM

## 2011-12-15 DIAGNOSIS — E669 Obesity, unspecified: Secondary | ICD-10-CM | POA: Insufficient documentation

## 2011-12-15 DIAGNOSIS — Z01810 Encounter for preprocedural cardiovascular examination: Secondary | ICD-10-CM

## 2011-12-15 DIAGNOSIS — I428 Other cardiomyopathies: Secondary | ICD-10-CM | POA: Insufficient documentation

## 2011-12-15 DIAGNOSIS — Z853 Personal history of malignant neoplasm of breast: Secondary | ICD-10-CM | POA: Insufficient documentation

## 2011-12-15 DIAGNOSIS — I509 Heart failure, unspecified: Secondary | ICD-10-CM | POA: Insufficient documentation

## 2011-12-15 LAB — BASIC METABOLIC PANEL
BUN: 13 mg/dL (ref 6–23)
CO2: 32 mEq/L (ref 19–32)
Chloride: 97 mEq/L (ref 96–112)
Creatinine, Ser: 0.8 mg/dL (ref 0.4–1.2)
Glucose, Bld: 99 mg/dL (ref 70–99)

## 2011-12-15 LAB — CBC WITH DIFFERENTIAL/PLATELET
Eosinophils Absolute: 0.2 10*3/uL (ref 0.0–0.7)
HCT: 38.9 % (ref 36.0–46.0)
Lymphs Abs: 1.5 10*3/uL (ref 0.7–4.0)
MCHC: 32.7 g/dL (ref 30.0–36.0)
MCV: 97.5 fl (ref 78.0–100.0)
Monocytes Absolute: 0.6 10*3/uL (ref 0.1–1.0)
Neutrophils Relative %: 75.1 % (ref 43.0–77.0)
Platelets: 246 10*3/uL (ref 150.0–400.0)
RDW: 14.7 % — ABNORMAL HIGH (ref 11.5–14.6)

## 2011-12-15 LAB — PROTIME-INR
INR: 1 ratio (ref 0.8–1.0)
Prothrombin Time: 11.3 s (ref 10.2–12.4)

## 2011-12-15 MED ORDER — SODIUM CHLORIDE 0.9 % IR SOLN
80.0000 mg | Status: DC
  Filled 2011-12-15: qty 2

## 2011-12-15 MED ORDER — CEFAZOLIN SODIUM-DEXTROSE 2-3 GM-% IV SOLR
2.0000 g | INTRAVENOUS | Status: DC
  Filled 2011-12-15: qty 50

## 2011-12-16 ENCOUNTER — Ambulatory Visit (HOSPITAL_COMMUNITY): Payer: Medicare PPO

## 2011-12-16 ENCOUNTER — Encounter (HOSPITAL_COMMUNITY)
Admission: RE | Disposition: A | Discharge status: Home / Self Care | Payer: Self-pay | Source: Ambulatory Visit | Attending: Internal Medicine

## 2011-12-16 DIAGNOSIS — T82198A Other mechanical complication of other cardiac electronic device, initial encounter: Secondary | ICD-10-CM

## 2011-12-16 DIAGNOSIS — I428 Other cardiomyopathies: Secondary | ICD-10-CM

## 2011-12-16 HISTORY — PX: BIV ICD GENERTAOR CHANGE OUT: SHX5745

## 2011-12-16 LAB — SURGICAL PCR SCREEN
MRSA, PCR: NEGATIVE
Staphylococcus aureus: NEGATIVE

## 2011-12-16 SURGERY — BIV ICD GENERTAOR CHANGE OUT
Anesthesia: LOCAL

## 2011-12-16 MED ORDER — MIDAZOLAM HCL 5 MG/5ML IJ SOLN
INTRAMUSCULAR | Status: AC
  Filled 2011-12-16: qty 5

## 2011-12-16 MED ORDER — FENTANYL CITRATE 0.05 MG/ML IJ SOLN
INTRAMUSCULAR | Status: AC
  Filled 2011-12-16: qty 2

## 2011-12-16 MED ORDER — HEPARIN (PORCINE) IN NACL 2-0.9 UNIT/ML-% IJ SOLN
INTRAMUSCULAR | Status: AC
  Filled 2011-12-16: qty 1000

## 2011-12-16 MED ORDER — CHLORHEXIDINE GLUCONATE 4 % EX LIQD
60.0000 mL | Freq: Once | CUTANEOUS | Status: DC
  Filled 2011-12-16: qty 60

## 2011-12-16 MED ORDER — ACETAMINOPHEN 325 MG PO TABS
325.0000 mg | ORAL_TABLET | ORAL | Status: DC | PRN
Start: 2011-12-16 — End: 2011-12-16
  Filled 2011-12-16: qty 2

## 2011-12-16 MED ORDER — CEFAZOLIN SODIUM-DEXTROSE 2-3 GM-% IV SOLR
INTRAVENOUS | Status: AC
  Filled 2011-12-16: qty 50

## 2011-12-16 MED ORDER — MUPIROCIN 2 % EX OINT
TOPICAL_OINTMENT | CUTANEOUS | Status: AC
  Administered 2011-12-16: 1 via NASAL
  Filled 2011-12-16: qty 22

## 2011-12-16 MED ORDER — LIDOCAINE HCL (PF) 1 % IJ SOLN
INTRAMUSCULAR | Status: AC
  Filled 2011-12-16: qty 60

## 2011-12-16 MED ORDER — SODIUM CHLORIDE 0.9 % IV SOLN: INTRAVENOUS | Status: DC

## 2011-12-16 MED ORDER — SODIUM CHLORIDE 0.45 % IV SOLN
INTRAVENOUS | Status: DC
  Administered 2011-12-16: 13:00:00 via INTRAVENOUS

## 2011-12-16 MED ORDER — MUPIROCIN 2 % EX OINT
TOPICAL_OINTMENT | Freq: Once | CUTANEOUS | Status: AC
  Administered 2011-12-16: 1 via NASAL
  Filled 2011-12-16: qty 22

## 2011-12-16 MED ORDER — ONDANSETRON HCL 4 MG/2ML IJ SOLN: 4.0000 mg | Freq: Four times a day (QID) | INTRAMUSCULAR | Status: DC | PRN

## 2011-12-16 NOTE — Discharge Instructions (Signed)
Pacemaker Implantation Care After Always carry your pacemaker card with you. The card should list the implant date, device model and manufacturer.  HOME CARE INSTRUCTIONS  Keep the incision dry for a week after the procedure. It may take several weeks for the incision to heal.   For about 6 weeks, avoid sudden jerking, pulling or chopping movements that pull your arm away from your body. For instance, you should not play golf for 6 weeks.   Do not raise your arms above your shoulders for 1-2 weeks or as told by your caregiver.   Take medicine as told by your caregiver.   Learn how to check your pulse. Follow directions about when to call or be concerned.   Exercise as told by your caregiver.   Household appliances do not interfere with pacemakers.   Travel by airplane should not be a problem. Tell security you have a pacemaker before going through the metal detector. Carry your pacemaker ID card with you.   Never leave a cell phone in a pocket over the pacemaker.   Avoid strong electro-magnetic fields. You will not be able to have an MRI scan because of the strong magnets.  PACEMAKER CARE:  Avoid putting pressure over the area where the pacer was put in.   Digital cell phones should be kept 12 inches away from the pacemaker. Hold the cell phone to the ear opposite of the pacemaker.   Never leave a cell phone in a pocket over the pacemaker.   Avoid strong electro-magnetic fields. You will not be able to have an MRI scan because of the strong magnets.   Pacer batteries last about 5 years and give off warning signals when they are running low on power. Pacers may be checked every 3 months. This allows plenty of time to change the generator when it is running low on power.   Changing the battery means removing the old generator through the same cut and plugging the existing wires into the new generator.   An EKG or heart monitor is used to see if your pacer is working properly.  Sometimes signals may be sent over a land line phone to your clinic.   Wear a medical alert bracelet. This can help emergency responders know you have a pacemaker.  SEEK MEDICAL CARE IF:  You begin to gain weight and your feet and ankles swell.   You have dizzy spells or feel weak.   Your pulse rate drops below the limit or is too fast.   You have redness and swelling over your pacemaker insertion site.  SEEK IMMEDIATE MEDICAL CARE IF:  You faint or pass out.   You have chest pain or shortness of breath.   You are injured and think your pacemaker may have been damaged.   You are suddenly very tired or have pain in your back.   You have yellow drainage coming from the pacemaker incision site.   You are worried that your heart is not beating right or cannot feel your pulse.  Document Released: 03/13/2005 Document Revised: 08/13/2011 Document Reviewed: 03/26/2008 [facility] Patient Information 2012 [place].

## 2011-12-16 NOTE — H&P (View-Only) (Signed)
HPI  [name] is a 56 y.o. female seen today in follow-up for congestive heart failure in the setting of nonischemic cardiomyopathy. She is status post CRTD implantation about 4 years ago.She has a sprint  Fidelis lead   The patient denies chest pain, edema or palpitations. She is recovering from the flu  She underwent catheterization 12/10 ,  coronaries were normal and  filling pressures were normal       [facility] study in 2007 normal perfusion by report. Echo 5/12 EF remains in the 55-60% range   Medical history notable for  breast CA in 1998 treated with Adriamycin x 4 cycles.   The patient denies chest pain, shortness of breath, nocturnal dyspnea, orthopnea or peripheral edema.  There have been no palpitations, lightheadedness or syncope.     Past Medical History  Diagnosis Date  . CHF (congestive heart failure)     due to non-ischemic CM (possible anthracycline toxicity) - dx'd 2007 (EF 25%); cath 12/10. Coronaries normal. LVEF 50%; Right atrial pressure mean of 7, RV pressure 28/6 . Pulmonary artery pressure was 23/9 with mean of  15.  Pulmonary capillary wedge pressure mean of 9. Fick cardiac output 3.9 L per minute.  Cardiac index  2.2 L per minute per meter squared.  Pulmonary vascular resistance was 1.5  . Breast cancer     s/p mastectomy and chemo with adriamycin (1998)  . Obesity   . NSVT (nonsustained ventricular tachycardia)   . Biventricular implantable cardiac defibrillator)     Medtronic  . Nonischemic cardiomyopathy     EF 55% echo 2012  . sprint Fidelis     Past Surgical History  Procedure Date  . Biv icd     Medtronic    Current Outpatient Prescriptions  Medication Sig Dispense Refill  . allopurinol (ZYLOPRIM) 300 MG tablet Take 300 mg by mouth daily.        . B Complex-C (SUPER B COMPLEX PO) Take by mouth daily.      . carvedilol (COREG) 12.5 MG tablet Take 1 tablet (12.5 mg total) by mouth 2 (two) times daily with a meal.  180 tablet  3  .  citalopram (CELEXA) 40 MG tablet Take 40 mg by mouth daily.        . digoxin (LANOXIN) 0.125 MG tablet Take 1 tablet (125 mcg total) by mouth daily.  90 tablet  3  . fish oil-omega-3 fatty acids 1000 MG capsule Take 300 mg by mouth daily.      . furosemide (LASIX) 40 MG tablet Take 40 mg by mouth 2 (two) times daily. 2 tablets in the am and 2 tablets in the pm      . losartan (COZAAR) 50 MG tablet Take 1 tablet (50 mg total) by mouth daily.  90 tablet  3  . omeprazole (PRILOSEC) 20 MG capsule Take 20 mg by mouth as needed.       . potassium chloride SA (K-DUR,KLOR-CON) 20 MEQ tablet Take 0.5 tablets (10 mEq total) by mouth daily.  90 tablet  3  . simvastatin (ZOCOR) 40 MG tablet Take 1 tablet (40 mg total) by mouth every evening.  90 tablet  3  . spironolactone (ALDACTONE) 25 MG tablet Take 1 tablet (25 mg total) by mouth daily.  90 tablet  3  . zolpidem (AMBIEN) 10 MG tablet Take 10 mg by mouth at bedtime as needed.          No Known Allergies  Review of Systems  negative except from HPI and PMH  Physical Exam BP 110/76  Pulse 60  Ht 5' 1.5" (1.562 m)  Wt 191 lb 12.8 oz (87 kg)  BMI 35.65 kg/m2 Well developed and well nourished in no acute distress HENT normal E scleral and icterus clear Neck Supple JVP flat; carotids brisk and full Clear to ausculation Right sided implant *Regular rate and rhythm, no murmurs gallops or rub Soft with active bowel sounds No clubbing cyanosis no Edema Alert and oriented, grossly normal motor and sensory function Skin Warm and Dry   Assessment and  Plan

## 2011-12-16 NOTE — CV Procedure (Signed)
Preoperative diagnosis NICM previous CRTD now at [facility] Postoperative diagnosis same/  Procedure: Generator replacement  /pocket revision lead repair, highvoltage testing and swapping of the LV anf RV rate sense portion to protect against inappropriate shocks 2/2 6949 fracture risk  Following informed consent the patient was brought to the electrophysiology laboratory in place of the fluoroscopic table in the supine position after routine prep and drape lidocaine was infiltrated in the region of the previous incision and carried down to later the device pocket using sharp dissection and electrocautery. The pocket was opened the device was freed up and was explanted.  Interrogation of the previously implanted R ventricular lead  Medtronic [phone]  demonstrated an R wave of 11 millivolts., and impedance of 670ohms, and a pacing threshold of 0.5 volts at 0.5 msec.  There was heme discoloration of the rate sense portion and medical Trena Platt was applied   Interrogation of the previously implanted  L ventricular lead MDT [id number]  demonstrated an R wave of 22.6 millivolts., and impedance of 738ohms, and a pacing threshold of 1.25 volts at 0.5 msec.     The previously implanted atrial lead Medtronic added P-wave amplitude of 4.1 milllivolts and impedance of  421 ohms, and a pacing threshold of 0.5volts at 0.5 milliseconds.  The leads were inspected. The leads were then attached to a  medtronic [id number]  pulse generator, serial number [id number]  THE RV rate sense portion was placed in the LV port and the LV lead was placed in the RV port.    The pocket was irrigated with antibiotic containing saline solution following cephalad extension;  hemostasis was assured and the leads and the device were placed in the pocket. The wound is then closed in 3 layers in normal fashion  A 1 JOule test shock was applied synchronously in sinus rhtyhm with impedance of 51Ohms and transient Afib.  The patient tolerated the  procedure without apparent complication.  Virl Axe

## 2011-12-16 NOTE — Interval H&P Note (Signed)
History and Physical Interval Note:  12/16/2011 12:27 PM  [name]  has presented today for surgery, with the diagnosis of ERI  The various methods of treatment have been discussed with the patient and family. After consideration of risks, benefits and other options for treatment, the patient has consented to  Procedure(s) (LRB): BIV ICD [place] (N/A) as a surgical intervention .  The patients' history has been reviewed, patient examined, no change in status, stable for surgery.  I have reviewed the patients' chart and labs.  Questions were answered to the patient's satisfaction.     Virl Axe  Echo 50%

## 2011-12-22 ENCOUNTER — Ambulatory Visit (HOSPITAL_COMMUNITY)
Admission: RE | Admit: 2011-12-22 | Discharge: 2011-12-22 | Disposition: A | Discharge status: Home / Self Care | Payer: Medicare PPO | Source: Ambulatory Visit | Attending: Internal Medicine | Admitting: Internal Medicine

## 2011-12-22 VITALS — BP 88/52 | HR 85 | Wt 195.5 lb

## 2011-12-22 DIAGNOSIS — I428 Other cardiomyopathies: Secondary | ICD-10-CM

## 2011-12-22 DIAGNOSIS — I5022 Chronic systolic (congestive) heart failure: Secondary | ICD-10-CM | POA: Insufficient documentation

## 2011-12-22 NOTE — Patient Instructions (Signed)
Follow up in 4 months  Do the following things EVERYDAY: 1) Weigh yourself in the morning before breakfast. Write it down and keep it in a log. 2) Take your medicines as prescribed 3) Eat low salt foods--Limit salt (sodium) to 2000mg  per day.  4) Stay as active as you can everyday

## 2011-12-22 NOTE — Assessment & Plan Note (Deleted)
NYHA II. Volume status stable. Continue current diuretics. Discussed ECHO results EF 50%. Reviewed most recent lab work. Renal function stable. Encouraged to increase activity. Follow up in months.

## 2011-12-28 ENCOUNTER — Ambulatory Visit (INDEPENDENT_AMBULATORY_CARE_PROVIDER_SITE_OTHER): Payer: Medicare PPO | Admitting: *Deleted

## 2011-12-28 ENCOUNTER — Encounter: Payer: Self-pay | Admitting: Internal Medicine

## 2011-12-28 DIAGNOSIS — I472 Ventricular tachycardia: Secondary | ICD-10-CM

## 2011-12-28 DIAGNOSIS — I428 Other cardiomyopathies: Secondary | ICD-10-CM

## 2011-12-28 DIAGNOSIS — I4729 Other ventricular tachycardia: Secondary | ICD-10-CM

## 2011-12-28 DIAGNOSIS — Z95810 Presence of automatic (implantable) cardiac defibrillator: Secondary | ICD-10-CM

## 2011-12-28 LAB — ICD DEVICE OBSERVATION
AL AMPLITUDE: 2.8 mv
AL IMPEDENCE ICD: 361 Ohm
AL THRESHOLD: 0.625 V
ATRIAL PACING ICD: 6.3 pct
LV LEAD THRESHOLD: 0.625 V
RV LEAD AMPLITUDE: 16 mv
RV LEAD THRESHOLD: 1.25 V

## 2011-12-28 NOTE — Progress Notes (Signed)
Pt seen in clinic for follow up of ICD.  No complaints of chest pain, shortness of breath, dizziness, palpitations, or shocks.  Device functioning normally at this time.  For full details, see PaceArt report.  No programming changes made today.  Plan to follow up in 3 months with Dr Tyrone Apple, RN, BSN 12/28/2011 2:36 PM

## 2012-01-03 NOTE — Assessment & Plan Note (Signed)
EF has recovered. Doing well. NYHA II. Volume status stable. Continue current diuretics. Reviewed ECHO results with her.  EF stable at 50%. Encouraged to increase activity. Follow up in 9 months.

## 2012-01-03 NOTE — Progress Notes (Signed)
Patient ID: [name], female   DOB: [birth year], 56 y.o.   MRN: [id number]  HPI: [name] is a 56 y/o woman with history of breast CA in 1998 treated with Adriamycin x 4 cycles. In 2007, diagnosed with CHF with EF 25-35%. She is s/p Medtronic BiV-ICD in 2008 with Sprint Fidelis lead. LV function has subsequently recovered   Had cath 12/10. Coronaries normal. Right atrial pressure mean of 7, RV pressure 28/6 . Pulmonary artery pressure was 23/9 with mean of 15.  Pulmonary capillary wedge pressure mean of 9. Fick cardiac output 3.9 L per minute.  Cardiac index 2.2 L per minute per meter squared.  Pulmonary vascular resistance was 1.5 Woods units.  Echo 5/11: EF 50-55%  12/15/11 ECHO EF 50%. RV normal.  12/16/11 ICD generator replaced due to EOL  Returns for routine follow-up.  Complains of R upper chest pain from generator replacement. Denies PND/Orthopnea. SOB on incline/walking stairs.  Sleeps on one pillow. Weight at home 187-190 pounds. She has not required extra lasix. Not currently exercising.  Complaint with medications.  ROS: All systems negative except as listed in HPI, PMH and Problem List.  Past Medical History  Diagnosis Date  . CHF (congestive heart failure)     due to non-ischemic CM (possible anthracycline toxicity) - dx'd 2007 (EF 25%); cath 12/10. Coronaries normal. LVEF 50%; Right atrial pressure mean of 7, RV pressure 28/6 . Pulmonary artery pressure was 23/9 with mean of  15.  Pulmonary capillary wedge pressure mean of 9. Fick cardiac output 3.9 L per minute.  Cardiac index  2.2 L per minute per meter squared.  Pulmonary vascular resistance was 1.5  . Breast cancer     s/p mastectomy and chemo with adriamycin (1998)  . Obesity   . NSVT (nonsustained ventricular tachycardia)   . Biventricular implantable cardiac defibrillator)     Medtronic  . Nonischemic cardiomyopathy     EF 55% echo 2012  . sprint Fidelis     Current Outpatient Prescriptions  Medication Sig  Dispense Refill  . allopurinol (ZYLOPRIM) 300 MG tablet Take 300 mg by mouth daily.        . B Complex-C (SUPER B COMPLEX PO) Take by mouth daily.      . carvedilol (COREG) 12.5 MG tablet Take 12.5 mg by mouth 2 (two) times daily with a meal.      . citalopram (CELEXA) 40 MG tablet Take 40 mg by mouth daily.        . digoxin (LANOXIN) 0.125 MG tablet Take 125 mcg by mouth daily.      . furosemide (LASIX) 40 MG tablet Take 80 mg by mouth 2 (two) times daily.       Marland Kitchen losartan (COZAAR) 50 MG tablet Take 50 mg by mouth daily.      . Omega-3 Fatty Acids (FISH OIL) 300 MG CAPS Take 1 capsule by mouth daily.      Marland Kitchen omeprazole (PRILOSEC) 20 MG capsule Take 20 mg by mouth as needed.       . potassium chloride SA (K-DUR,KLOR-CON) 20 MEQ tablet Take 10 mEq by mouth daily.      . simvastatin (ZOCOR) 40 MG tablet Take 40 mg by mouth every evening.      Marland Kitchen spironolactone (ALDACTONE) 25 MG tablet Take 25 mg by mouth daily.      Marland Kitchen zolpidem (AMBIEN) 10 MG tablet Take 10 mg by mouth at bedtime as needed. For sleep  PHYSICAL EXAM: Filed Vitals:   12/22/11 0859  BP: 88/52  Pulse: 85  195 (189) General:  Well appearing. No resp difficulty HEENT: normal Neck: supple. JVP flat. Carotids 2+ bilaterally; no bruits. No lymphadenopathy or thryomegaly appreciated. Cor: PMI normal. Regular rate & rhythm. No rubs, gallops or murmurs. R upper chest incision approximated. C/d/i Lungs: clear Abdomen: soft, nontender, nondistended. No hepatosplenomegaly. No bruits or masses. Good bowel sounds. Extremities: no cyanosis, clubbing, rash, edema Neuro: alert & orientedx3, cranial nerves grossly intact. Moves all 4 extremities w/o difficulty. Affect pleasant.      ASSESSMENT & PLAN:

## 2012-03-03 ENCOUNTER — Encounter: Payer: Self-pay | Admitting: Internal Medicine

## 2012-03-03 ENCOUNTER — Telehealth (HOSPITAL_COMMUNITY): Payer: Self-pay | Admitting: *Deleted

## 2012-03-03 NOTE — Telephone Encounter (Signed)
Order for bmet faxed to [phone]

## 2012-03-03 NOTE — Telephone Encounter (Signed)
Left message to call back  

## 2012-03-03 NOTE — Telephone Encounter (Signed)
Pt states her wt is up and down and she is swollen, she has increased her Lasix to 120 mg bid from 80 bid  About every other day but its not helping much, advised to send an optivol transmission she will do so and will have Dr Haroldine Laws review and call her back

## 2012-03-03 NOTE — Telephone Encounter (Signed)
[name] called today to schedule her 4 month follow up. She wanted to let us know that she has been having some extra fluid building up that she cannot get rid of and would like you to know about it.  Please call her back. Thanks.

## 2012-03-03 NOTE — Telephone Encounter (Signed)
optivol ok per Dr Haroldine Laws get bmet to check renal function if ok then its all right to take 120 of lasix as needed, watch diet and keep feet elevated when possible, she is agreeable and will call me back with the number of the local hospital for labs

## 2012-03-09 ENCOUNTER — Encounter (HOSPITAL_COMMUNITY): Payer: Self-pay

## 2012-03-18 ENCOUNTER — Encounter: Payer: Self-pay | Admitting: *Deleted

## 2012-03-29 ENCOUNTER — Encounter: Payer: Self-pay | Admitting: Internal Medicine

## 2012-03-29 ENCOUNTER — Ambulatory Visit (INDEPENDENT_AMBULATORY_CARE_PROVIDER_SITE_OTHER): Payer: Medicare PPO | Admitting: Internal Medicine

## 2012-03-29 VITALS — BP 102/62 | HR 78 | Ht 61.0 in | Wt 195.8 lb

## 2012-03-29 DIAGNOSIS — I509 Heart failure, unspecified: Secondary | ICD-10-CM

## 2012-03-29 DIAGNOSIS — Z95810 Presence of automatic (implantable) cardiac defibrillator: Secondary | ICD-10-CM

## 2012-03-29 DIAGNOSIS — I428 Other cardiomyopathies: Secondary | ICD-10-CM

## 2012-03-29 DIAGNOSIS — I5023 Acute on chronic systolic (congestive) heart failure: Secondary | ICD-10-CM | POA: Insufficient documentation

## 2012-03-29 DIAGNOSIS — T82198A Other mechanical complication of other cardiac electronic device, initial encounter: Secondary | ICD-10-CM

## 2012-03-29 LAB — ICD DEVICE OBSERVATION
AL IMPEDENCE ICD: 361 Ohm
AL THRESHOLD: 0.75 V
ATRIAL PACING ICD: 7.69 pct
BAMS-0001: 170 {beats}/min
BATTERY VOLTAGE: 3.2003 V
CHARGE TIME: 0.25 s
LV LEAD IMPEDENCE ICD: 760 Ohm
PACEART VT: 0
TOT-0001: 0
TOT-0002: 0
TOT-0006: 20130410000000
TZAT-0001ATACH: 1
TZAT-0001ATACH: 2
TZAT-0001ATACH: 3
TZAT-0001FASTVT: 1
TZAT-0002ATACH: NEGATIVE
TZAT-0002FASTVT: NEGATIVE
TZAT-0004SLOWVT: 8
TZAT-0004SLOWVT: 8
TZAT-0005SLOWVT: 88 pct
TZAT-0005SLOWVT: 91 pct
TZAT-0011SLOWVT: 10 ms
TZAT-0012ATACH: 150 ms
TZAT-0012ATACH: 150 ms
TZAT-0012SLOWVT: 170 ms
TZAT-0012SLOWVT: 170 ms
TZAT-0013SLOWVT: 3
TZAT-0013SLOWVT: 3
TZAT-0018ATACH: NEGATIVE
TZAT-0018ATACH: NEGATIVE
TZAT-0018FASTVT: NEGATIVE
TZAT-0018SLOWVT: NEGATIVE
TZAT-0019ATACH: 6 V
TZON-0003ATACH: 350 ms
TZON-0003SLOWVT: 340 ms
TZON-0004SLOWVT: 16
TZON-0004VSLOWVT: 20
TZON-0005SLOWVT: 12
TZST-0001ATACH: 4
TZST-0001ATACH: 6
TZST-0001FASTVT: 2
TZST-0001FASTVT: 3
TZST-0001FASTVT: 4
TZST-0001FASTVT: 6
TZST-0001SLOWVT: 4
TZST-0001SLOWVT: 6
TZST-0002ATACH: NEGATIVE
TZST-0002FASTVT: NEGATIVE
TZST-0002FASTVT: NEGATIVE
TZST-0003SLOWVT: 25 J
TZST-0003SLOWVT: 35 J
VENTRICULAR PACING ICD: 99.61 pct

## 2012-03-29 NOTE — Patient Instructions (Signed)
Your physician wants you to follow-up in:  [place] will receive a reminder letter in the mail two months in advance. If you don't receive a letter, please call our office to schedule the follow-up appointment. Your physician recommends that you continue on your current medications as directed. Please refer to the Current Medication list given to you today.

## 2012-03-29 NOTE — Progress Notes (Signed)
HPI  [name] is a 56 y.o. female  seen today in follow-up for congestive heart failure in the setting of nonischemic cardiomyopathy. She is status post CRTD implantation about 4 years ago.She has a sprint  Fidelis lead she reached ERI in April. We reversed the rate sense portion of the RV and LV leads   She feels as if her shortness of breath has gotten worse since the device generator was replaced. Pacing parameters were reviewed and were unchanged. She notes no change in her diet. There has been more fluid of late. She attributes this to the summer.  She underwent catheterization 12/10 ,  coronaries were normal and  filling pressures were normal       Nuke study in 2007 normal perfusion by report. Echo 5/12 EF remains in the 55-60% range   Medical history notable for  breast CA in 1998 treated with Adriamycin x 4 cycles.   The patient denies chest pain, shortness of breath, nocturnal dyspnea, orthopnea or peripheral edema.  There have been no palpitations, lightheadedness or syncope.   Past Medical History  Diagnosis Date  . CHF (congestive heart failure)     due to non-ischemic CM (possible anthracycline toxicity) - dx'd 2007 (EF 25%); cath 12/10. Coronaries normal. LVEF 50%; Right atrial pressure mean of 7, RV pressure 28/6 . Pulmonary artery pressure was 23/9 with mean of  15.  Pulmonary capillary wedge pressure mean of 9. Fick cardiac output 3.9 L per minute.  Cardiac index  2.2 L per minute per meter squared.  Pulmonary vascular resistance was 1.5  . Breast cancer     s/p mastectomy and chemo with adriamycin (1998)  . Obesity   . NSVT (nonsustained ventricular tachycardia)   . Biventricular implantable cardiac defibrillator)     Medtronic  . Nonischemic cardiomyopathy     EF 55% echo 2012  . sprint Fidelis     Past Surgical History  Procedure Date  . Biv icd     Medtronic    Current Outpatient Prescriptions  Medication Sig Dispense Refill  . allopurinol  (ZYLOPRIM) 300 MG tablet Take 300 mg by mouth daily.        . B Complex-C (SUPER B COMPLEX PO) Take by mouth daily.      . carvedilol (COREG) 12.5 MG tablet Take 12.5 mg by mouth 2 (two) times daily with a meal.      . citalopram (CELEXA) 40 MG tablet Take 40 mg by mouth daily.        . digoxin (LANOXIN) 0.125 MG tablet Take 125 mcg by mouth daily.      . furosemide (LASIX) 40 MG tablet Take 80 mg by mouth 2 (two) times daily.       Marland Kitchen losartan (COZAAR) 50 MG tablet Take 50 mg by mouth daily.      . Omega-3 Fatty Acids (FISH OIL) 300 MG CAPS Take 1 capsule by mouth daily.      Marland Kitchen omeprazole (PRILOSEC) 20 MG capsule Take 20 mg by mouth as needed.       . potassium chloride SA (K-DUR,KLOR-CON) 20 MEQ tablet Take 10 mEq by mouth daily.      . simvastatin (ZOCOR) 40 MG tablet Take 40 mg by mouth every evening.      Marland Kitchen spironolactone (ALDACTONE) 25 MG tablet Take 25 mg by mouth daily.      Marland Kitchen zolpidem (AMBIEN) 10 MG tablet Take 10 mg by mouth at bedtime as needed. For sleep  No Known Allergies  Review of Systems negative except from HPI and PMH  Physical Exam BP 102/62  Pulse 78  Ht 5\' 1"  (1.549 m)  Wt 195 lb 12.8 oz (88.814 kg)  BMI 37.00 kg/m2 Well developed and well nourished in no acute distress HENT normal E scleral and icterus clear Neck Supple JVP  5-6 cm carotids brisk and full Clear to ausculation Regular rate and rhythm, no murmurs gallops or rub Soft with active bowel sounds No clubbing cyanosis Trace Edema Alert and oriented, grossly normal motor and sensory function Skin Warm and Dry    Assessment and  Plan

## 2012-03-29 NOTE — Assessment & Plan Note (Signed)
Continue current medications. 

## 2012-03-29 NOTE — Assessment & Plan Note (Signed)
She has been notably worse she thinks this is device generator replacement. Her AV delay was programmed quite short and 80 ms; this was no different from before. I wonder whether there is any issues about LV RV timing associated with the change in the configuration of the system; I don't think there should have been.  In any case I have reprogrammed the AV delay from 80-1:30 milliseconds to see if we can't improve left atrial contribution to LV filling. She is to let us know if there are any worsening of her symptoms; if there are  or otherwise there is no improvement we can undertake AV optimization echo

## 2012-03-29 NOTE — Assessment & Plan Note (Signed)
RV-LV lead switch

## 2012-03-29 NOTE — Assessment & Plan Note (Signed)
The patient's device was interrogated and the information was fully reviewed.  The device was reprogrammed to lengthen the time for intravenous to detect in the VT 1 zone from 18-36 intervals   In addition we change the AV delays as noted above

## 2012-04-07 ENCOUNTER — Ambulatory Visit (HOSPITAL_COMMUNITY)
Admission: RE | Admit: 2012-04-07 | Discharge: 2012-04-07 | Disposition: A | Discharge status: Home / Self Care | Payer: Medicare PPO | Source: Ambulatory Visit | Attending: Internal Medicine | Admitting: Internal Medicine

## 2012-04-07 ENCOUNTER — Telehealth: Payer: Self-pay | Admitting: *Deleted

## 2012-04-07 VITALS — BP 132/72 | HR 79 | Resp 18 | Ht 61.0 in | Wt 198.5 lb

## 2012-04-07 DIAGNOSIS — I5022 Chronic systolic (congestive) heart failure: Secondary | ICD-10-CM

## 2012-04-07 DIAGNOSIS — I509 Heart failure, unspecified: Secondary | ICD-10-CM | POA: Insufficient documentation

## 2012-04-07 DIAGNOSIS — E663 Overweight: Secondary | ICD-10-CM

## 2012-04-07 DIAGNOSIS — R0602 Shortness of breath: Secondary | ICD-10-CM

## 2012-04-07 DIAGNOSIS — I472 Ventricular tachycardia, unspecified: Secondary | ICD-10-CM | POA: Insufficient documentation

## 2012-04-07 DIAGNOSIS — E669 Obesity, unspecified: Secondary | ICD-10-CM | POA: Insufficient documentation

## 2012-04-07 DIAGNOSIS — I428 Other cardiomyopathies: Secondary | ICD-10-CM

## 2012-04-07 DIAGNOSIS — R5383 Other fatigue: Secondary | ICD-10-CM | POA: Insufficient documentation

## 2012-04-07 DIAGNOSIS — Z95810 Presence of automatic (implantable) cardiac defibrillator: Secondary | ICD-10-CM | POA: Insufficient documentation

## 2012-04-07 DIAGNOSIS — R5381 Other malaise: Secondary | ICD-10-CM | POA: Insufficient documentation

## 2012-04-07 DIAGNOSIS — I4729 Other ventricular tachycardia: Secondary | ICD-10-CM | POA: Insufficient documentation

## 2012-04-07 DIAGNOSIS — Z9010 Acquired absence of unspecified breast and nipple: Secondary | ICD-10-CM | POA: Insufficient documentation

## 2012-04-07 DIAGNOSIS — Z9221 Personal history of antineoplastic chemotherapy: Secondary | ICD-10-CM | POA: Insufficient documentation

## 2012-04-07 DIAGNOSIS — Z853 Personal history of malignant neoplasm of breast: Secondary | ICD-10-CM | POA: Insufficient documentation

## 2012-04-07 NOTE — Patient Instructions (Addendum)
Will have Dr. Caryl Comes set up an optimization echocardiogram (for your device), Alvis Lemmings, RN will call you.   Will refer to pulmonologist for sleep study.   Follow up with Dr. Haroldine Laws in 6 months.    Do the following things EVERYDAY: 1) Weigh yourself in the morning before breakfast. Write it down and keep it in a log. 2) Take your medicines as prescribed 3) Eat low salt foods-Limit salt (sodium) to 2000 mg per day.  4) Stay as active as you can everyday 5) Limit all fluids for the day to less than 2 liters

## 2012-04-07 NOTE — Assessment & Plan Note (Signed)
As above. Doubt fatigue related to HF. Suspect multifactorial. Will get sleep study. May need depression screen at some point.

## 2012-04-07 NOTE — Progress Notes (Signed)
HPI: [name] is a 56 y/o woman with history of breast CA in 1998 treated with Adriamycin x 4 cycles. In 2007, diagnosed with CHF with EF 25-35%. She is s/p Medtronic BiV-ICD in 2008 with Sprint Fidelis lead. LV function has subsequently recovered  Had cath 12/10. Coronaries normal. Right atrial pressure mean of 7, RV pressure 28/6 . Pulmonary artery pressure was 23/9 with mean of 15.  Pulmonary capillary wedge pressure mean of 9. Fick cardiac output 3.9 L per minute.  Cardiac index 2.2 L per minute per meter squared.  Pulmonary vascular resistance was 1.5 Woods units.  Echo 5/11: EF 50-55%  12/15/11 ECHO EF 50%. RV normal.  12/16/11 ICD generator replaced due to EOL  Returns for routine follow-up.  Feels extremely tired.  Saw Dr. Caryl Comes on 7/23 and parameters were changed.  Did not help with fatigue.  Says weight at home is up 3 pounds.  Increased fatigue with activity.  Denies PND/Orthopnea. She is taking extra lasix several times a week.  Complaint with medications.  ROS: All systems negative except as listed in HPI, PMH and Problem List.  Past Medical History  Diagnosis Date  . CHF (congestive heart failure)     due to non-ischemic CM (possible anthracycline toxicity) - dx'd 2007 (EF 25%); cath 12/10. Coronaries normal. LVEF 50%; Right atrial pressure mean of 7, RV pressure 28/6 . Pulmonary artery pressure was 23/9 with mean of  15.  Pulmonary capillary wedge pressure mean of 9. Fick cardiac output 3.9 L per minute.  Cardiac index  2.2 L per minute per meter squared.  Pulmonary vascular resistance was 1.5  . Breast cancer     s/p mastectomy and chemo with adriamycin (1998)  . Obesity   . NSVT (nonsustained ventricular tachycardia)   . Biventricular implantable cardiac defibrillator)     Medtronic  . Nonischemic cardiomyopathy     EF 55% echo 2012  . sprint Fidelis     Current Outpatient Prescriptions  Medication Sig Dispense Refill  . allopurinol (ZYLOPRIM) 300 MG tablet Take 300 mg by  mouth daily.        . B Complex-C (SUPER B COMPLEX PO) Take by mouth daily.      . carvedilol (COREG) 12.5 MG tablet Take 12.5 mg by mouth 2 (two) times daily with a meal.      . citalopram (CELEXA) 40 MG tablet Take 40 mg by mouth daily.        . digoxin (LANOXIN) 0.125 MG tablet Take 125 mcg by mouth daily.      . furosemide (LASIX) 40 MG tablet Take 80 mg by mouth 2 (two) times daily.       Marland Kitchen losartan (COZAAR) 50 MG tablet Take 50 mg by mouth daily.      . Omega-3 Fatty Acids (FISH OIL) 300 MG CAPS Take 1 capsule by mouth daily.      Marland Kitchen omeprazole (PRILOSEC) 20 MG capsule Take 20 mg by mouth as needed.       . potassium chloride SA (K-DUR,KLOR-CON) 20 MEQ tablet Take 10 mEq by mouth daily.      . simvastatin (ZOCOR) 40 MG tablet Take 40 mg by mouth every evening.      Marland Kitchen spironolactone (ALDACTONE) 25 MG tablet Take 25 mg by mouth daily.      Marland Kitchen zolpidem (AMBIEN) 10 MG tablet Take 10 mg by mouth at bedtime as needed. For sleep         PHYSICAL EXAM: Filed Vitals:  04/07/12 0853  BP: 132/72  Pulse: 79  Resp: 18  Height: 5\' 1"  (1.549 m)  Weight: 198 lb 8 oz (90.039 kg)  SpO2: 99%   General:  Well appearing. No resp difficulty HEENT: normal Neck: supple. JVP flat. Carotids 2+ bilaterally; no bruits. No lymphadenopathy or thryomegaly appreciated. Cor: PMI normal. Regular rate & rhythm. No rubs, gallops or murmurs. R upper chest incision approximated. C/d/i Lungs: clear Abdomen: soft, nontender, nondistended. No hepatosplenomegaly. No bruits or masses. Good bowel sounds. Extremities: no cyanosis, clubbing, rash, trace edema Neuro: alert & orientedx3, cranial nerves grossly intact. Moves all 4 extremities w/o difficulty. Affect pleasant.   ASSESSMENT & PLAN:

## 2012-04-07 NOTE — Assessment & Plan Note (Signed)
Patient seen and examined with Leone Haven, PA-C. We discussed all aspects of the encounter. I agree with the assessment and plan as stated above.   Volume status mildly elevated but doing a good job with sliding scale lasix. I doubt her resting fatigue is related to her HF. Will refer for sleep study. She will also see Dr. Caryl Comes for AV optimization given recent change in her ICD/BiV parameters.

## 2012-04-07 NOTE — Telephone Encounter (Signed)
Message sent to [facility] by Dr. Haroldine Laws that the patient needs an AV optimization echo with Dr. Caryl Comes. I will place an order and have the PCC's contact the patient.

## 2012-04-19 ENCOUNTER — Ambulatory Visit (HOSPITAL_COMMUNITY): Payer: Medicare PPO | Attending: Cardiovascular Disease | Admitting: Radiology

## 2012-04-19 DIAGNOSIS — I428 Other cardiomyopathies: Secondary | ICD-10-CM | POA: Insufficient documentation

## 2012-04-19 DIAGNOSIS — E669 Obesity, unspecified: Secondary | ICD-10-CM | POA: Insufficient documentation

## 2012-04-19 DIAGNOSIS — I509 Heart failure, unspecified: Secondary | ICD-10-CM

## 2012-04-19 DIAGNOSIS — I5020 Unspecified systolic (congestive) heart failure: Secondary | ICD-10-CM | POA: Insufficient documentation

## 2012-04-19 DIAGNOSIS — I517 Cardiomegaly: Secondary | ICD-10-CM | POA: Insufficient documentation

## 2012-04-19 NOTE — Progress Notes (Signed)
Limited echocardiogram with AV optimization performed. AV optimization performed by Dr. Caryl Comes.

## 2012-05-02 ENCOUNTER — Telehealth (HOSPITAL_COMMUNITY): Payer: Self-pay | Admitting: Cardiology

## 2012-05-02 DIAGNOSIS — I5022 Chronic systolic (congestive) heart failure: Secondary | ICD-10-CM

## 2012-05-02 MED ORDER — FUROSEMIDE 40 MG PO TABS: 80.0000 mg | ORAL_TABLET | Freq: Two times a day (BID) | ORAL | Status: DC

## 2012-05-02 NOTE — Telephone Encounter (Signed)
Pt called to request a refill on her Lasix --> rx to be sent to her local pharmacy as she has needed to increase as she has been working on her swelling, and she out before her delivery date.

## 2012-07-04 ENCOUNTER — Ambulatory Visit (INDEPENDENT_AMBULATORY_CARE_PROVIDER_SITE_OTHER): Payer: Medicare PPO | Admitting: *Deleted

## 2012-07-04 ENCOUNTER — Encounter: Payer: Self-pay | Admitting: Internal Medicine

## 2012-07-04 DIAGNOSIS — Z95810 Presence of automatic (implantable) cardiac defibrillator: Secondary | ICD-10-CM

## 2012-07-04 DIAGNOSIS — I428 Other cardiomyopathies: Secondary | ICD-10-CM

## 2012-07-04 DIAGNOSIS — I5022 Chronic systolic (congestive) heart failure: Secondary | ICD-10-CM

## 2012-07-06 LAB — REMOTE ICD DEVICE
AL THRESHOLD: 0.75 V
BAMS-0001: 170 {beats}/min
BATTERY VOLTAGE: 3.1798 V
PACEART VT: 0
RV LEAD THRESHOLD: 1.625 V
TZAT-0001ATACH: 1
TZAT-0001ATACH: 2
TZAT-0001ATACH: 3
TZAT-0001SLOWVT: 1
TZAT-0001SLOWVT: 2
TZAT-0002ATACH: NEGATIVE
TZAT-0002ATACH: NEGATIVE
TZAT-0002FASTVT: NEGATIVE
TZAT-0004SLOWVT: 8
TZAT-0004SLOWVT: 8
TZAT-0005SLOWVT: 88 pct
TZAT-0005SLOWVT: 91 pct
TZAT-0012ATACH: 150 ms
TZAT-0012ATACH: 150 ms
TZAT-0012ATACH: 150 ms
TZAT-0013SLOWVT: 3
TZAT-0013SLOWVT: 3
TZAT-0018ATACH: NEGATIVE
TZAT-0018ATACH: NEGATIVE
TZAT-0018ATACH: NEGATIVE
TZAT-0018FASTVT: NEGATIVE
TZAT-0019ATACH: 6 V
TZAT-0019FASTVT: 8 V
TZAT-0020ATACH: 1.5 ms
TZAT-0020SLOWVT: 1.5 ms
TZON-0003ATACH: 350 ms
TZON-0003VSLOWVT: 450 ms
TZON-0004SLOWVT: 40
TZON-0004VSLOWVT: 20
TZST-0001ATACH: 6
TZST-0001FASTVT: 2
TZST-0001FASTVT: 6
TZST-0001SLOWVT: 3
TZST-0001SLOWVT: 5
TZST-0001SLOWVT: 6
TZST-0002ATACH: NEGATIVE
TZST-0002ATACH: NEGATIVE
TZST-0002FASTVT: NEGATIVE
TZST-0002FASTVT: NEGATIVE
TZST-0002FASTVT: NEGATIVE
TZST-0002FASTVT: NEGATIVE
TZST-0003SLOWVT: 15 J
TZST-0003SLOWVT: 35 J
VENTRICULAR PACING ICD: 99.86 pct
VF: 0

## 2012-07-25 ENCOUNTER — Other Ambulatory Visit (HOSPITAL_COMMUNITY): Payer: Self-pay | Admitting: *Deleted

## 2012-07-25 DIAGNOSIS — I5022 Chronic systolic (congestive) heart failure: Secondary | ICD-10-CM

## 2012-07-25 MED ORDER — FUROSEMIDE 40 MG PO TABS: ORAL_TABLET | ORAL | Status: DC

## 2012-07-28 ENCOUNTER — Other Ambulatory Visit (HOSPITAL_COMMUNITY): Payer: Self-pay | Admitting: *Deleted

## 2012-07-28 DIAGNOSIS — I5022 Chronic systolic (congestive) heart failure: Secondary | ICD-10-CM

## 2012-07-28 MED ORDER — FUROSEMIDE 40 MG PO TABS: ORAL_TABLET | ORAL | Status: DC

## 2012-08-02 ENCOUNTER — Encounter: Payer: Self-pay | Admitting: *Deleted

## 2012-09-02 ENCOUNTER — Encounter (HOSPITAL_COMMUNITY): Payer: Self-pay | Admitting: *Deleted

## 2012-09-07 ENCOUNTER — Telehealth (HOSPITAL_COMMUNITY): Payer: Medicare PPO

## 2012-09-14 ENCOUNTER — Other Ambulatory Visit (HOSPITAL_COMMUNITY): Payer: Self-pay | Admitting: *Deleted

## 2012-09-14 MED ORDER — SIMVASTATIN 40 MG PO TABS: 40.0000 mg | ORAL_TABLET | Freq: Every evening | ORAL | Status: DC

## 2012-09-15 ENCOUNTER — Other Ambulatory Visit (HOSPITAL_COMMUNITY): Payer: Self-pay | Admitting: *Deleted

## 2012-09-15 MED ORDER — DIGOXIN 125 MCG PO TABS: 125.0000 ug | ORAL_TABLET | Freq: Every day | ORAL | Status: DC

## 2012-09-15 MED ORDER — LOSARTAN POTASSIUM 50 MG PO TABS: 50.0000 mg | ORAL_TABLET | Freq: Every day | ORAL | Status: DC

## 2012-09-16 ENCOUNTER — Other Ambulatory Visit: Payer: Self-pay | Admitting: *Deleted

## 2012-09-16 MED ORDER — CARVEDILOL 12.5 MG PO TABS: 12.5000 mg | ORAL_TABLET | Freq: Two times a day (BID) | ORAL | Status: DC

## 2012-10-05 ENCOUNTER — Other Ambulatory Visit (HOSPITAL_COMMUNITY): Payer: Self-pay | Admitting: *Deleted

## 2012-10-05 MED ORDER — SPIRONOLACTONE 25 MG PO TABS: 25.0000 mg | ORAL_TABLET | Freq: Every day | ORAL | Status: DC

## 2012-10-10 ENCOUNTER — Encounter: Payer: Self-pay | Admitting: Internal Medicine

## 2012-10-10 ENCOUNTER — Ambulatory Visit (INDEPENDENT_AMBULATORY_CARE_PROVIDER_SITE_OTHER): Payer: Medicare PPO | Admitting: *Deleted

## 2012-10-10 ENCOUNTER — Other Ambulatory Visit (HOSPITAL_COMMUNITY): Payer: Self-pay | Admitting: *Deleted

## 2012-10-10 DIAGNOSIS — Z95810 Presence of automatic (implantable) cardiac defibrillator: Secondary | ICD-10-CM

## 2012-10-10 DIAGNOSIS — I5022 Chronic systolic (congestive) heart failure: Secondary | ICD-10-CM

## 2012-10-10 DIAGNOSIS — I428 Other cardiomyopathies: Secondary | ICD-10-CM

## 2012-10-10 LAB — REMOTE ICD DEVICE
AL AMPLITUDE: 2.5 mv
AL IMPEDENCE ICD: 342 Ohm
AL THRESHOLD: 0.625 V
BATTERY VOLTAGE: 3.1662 V
LV LEAD IMPEDENCE ICD: 760 Ohm
RV LEAD AMPLITUDE: 16.8 mv
RV LEAD IMPEDENCE ICD: 551 Ohm
RV LEAD THRESHOLD: 1.5 V
TOT-0002: 0
TOT-0006: 20130410000000
TZAT-0001FASTVT: 1
TZAT-0002ATACH: NEGATIVE
TZAT-0002ATACH: NEGATIVE
TZAT-0002FASTVT: NEGATIVE
TZAT-0004SLOWVT: 8
TZAT-0005SLOWVT: 88 pct
TZAT-0005SLOWVT: 91 pct
TZAT-0011SLOWVT: 10 ms
TZAT-0011SLOWVT: 10 ms
TZAT-0012ATACH: 150 ms
TZAT-0012ATACH: 150 ms
TZAT-0012FASTVT: 170 ms
TZAT-0012SLOWVT: 170 ms
TZAT-0012SLOWVT: 170 ms
TZAT-0019ATACH: 6 V
TZAT-0019ATACH: 6 V
TZAT-0019ATACH: 6 V
TZAT-0019SLOWVT: 8 V
TZAT-0019SLOWVT: 8 V
TZAT-0020ATACH: 1.5 ms
TZAT-0020ATACH: 1.5 ms
TZAT-0020FASTVT: 1.5 ms
TZON-0003ATACH: 350 ms
TZON-0003SLOWVT: 340 ms
TZST-0001ATACH: 4
TZST-0001ATACH: 5
TZST-0001FASTVT: 2
TZST-0001FASTVT: 3
TZST-0001SLOWVT: 4
TZST-0001SLOWVT: 6
TZST-0002ATACH: NEGATIVE
TZST-0002FASTVT: NEGATIVE
TZST-0002FASTVT: NEGATIVE
TZST-0002FASTVT: NEGATIVE
TZST-0003SLOWVT: 15 J
TZST-0003SLOWVT: 35 J
TZST-0003SLOWVT: 35 J
VF: 0

## 2012-10-10 MED ORDER — SPIRONOLACTONE 25 MG PO TABS: 25.0000 mg | ORAL_TABLET | Freq: Every day | ORAL | Status: DC

## 2012-10-27 ENCOUNTER — Ambulatory Visit (HOSPITAL_COMMUNITY)
Admission: RE | Admit: 2012-10-27 | Discharge: 2012-10-27 | Disposition: A | Discharge status: Home / Self Care | Payer: Medicare PPO | Source: Ambulatory Visit | Attending: Internal Medicine | Admitting: Internal Medicine

## 2012-10-27 VITALS — BP 92/58 | HR 72 | Wt 197.2 lb

## 2012-10-27 DIAGNOSIS — I5022 Chronic systolic (congestive) heart failure: Secondary | ICD-10-CM | POA: Insufficient documentation

## 2012-10-27 DIAGNOSIS — R5383 Other fatigue: Secondary | ICD-10-CM

## 2012-10-27 DIAGNOSIS — R5381 Other malaise: Secondary | ICD-10-CM | POA: Insufficient documentation

## 2012-10-27 NOTE — Assessment & Plan Note (Addendum)
NYHA II. Volume status stable. Optivol interrogated. No threshold crossings. Continue Current regimen. Follow up in 6 months with an ECHO.   Patient seen and examined with Darrick Grinder, NP. We discussed all aspects of the encounter. I agree with the assessment and plan as stated above.  Her EF has recovered. Volume status looks good by exam and Optivol. Reviewed Optivol tracings with her personally.

## 2012-10-27 NOTE — Patient Instructions (Addendum)
Follow up in 6 months with an ECHO

## 2012-10-27 NOTE — Progress Notes (Signed)
Patient ID: [name], female   DOB: Sep 07, 1956, 57 y.o.   MRN: [id number] PCP: Dr Mammie Russian  HPI: [name] is a 57 y/o woman with history of breast CA in 1998 treated with Adriamycin x 4 cycles. In 2007, diagnosed with CHF with EF 25-35%. She is s/p Medtronic BiV-ICD in 2008 with Sprint Fidelis lead. LV function has subsequently recovered  Had cath 12/10. Coronaries normal. Right atrial pressure mean of 7, RV pressure 28/6 . Pulmonary artery pressure was 23/9 with mean of 15.  Pulmonary capillary wedge pressure mean of 9. Fick cardiac output 3.9 L per minute.  Cardiac index 2.2 L per minute per meter squared.  Pulmonary vascular resistance was 1.5 Woods units.  Echo 5/11: EF 50-55%  12/15/11 ECHO EF 50%. RV normal. 12/16/11 ICD generator replaced due to EOL  Returns for routine follow-up.  Complains of fatigue during the day. Denies SOB/PND. + Orthopnea (Sleeps on 2 pillows). Weight at home 192. She takes extra lasix about once a month. Complaint with medications.  She is not exercising.   Optivol- no crossings noted. Activity 4 hours per day ROS: All systems negative except as listed in HPI, PMH and Problem List.  Past Medical History  Diagnosis Date  . CHF (congestive heart failure)     due to non-ischemic CM (possible anthracycline toxicity) - dx'd 2007 (EF 25%); cath 12/10. Coronaries normal. LVEF 50%; Right atrial pressure mean of 7, RV pressure 28/6 . Pulmonary artery pressure was 23/9 with mean of  15.  Pulmonary capillary wedge pressure mean of 9. Fick cardiac output 3.9 L per minute.  Cardiac index  2.2 L per minute per meter squared.  Pulmonary vascular resistance was 1.5  . Breast cancer     s/p mastectomy and chemo with adriamycin (1998)  . Obesity   . NSVT (nonsustained ventricular tachycardia)   . Biventricular implantable cardiac defibrillator)     Medtronic  . Nonischemic cardiomyopathy     EF 55% echo 2012  . sprint Fidelis     Current Outpatient Prescriptions   Medication Sig Dispense Refill  . allopurinol (ZYLOPRIM) 300 MG tablet Take 300 mg by mouth daily.        . carvedilol (COREG) 12.5 MG tablet Take 1 tablet (12.5 mg total) by mouth 2 (two) times daily with a meal.  180 tablet  3  . citalopram (CELEXA) 40 MG tablet Take 40 mg by mouth daily.        . digoxin (LANOXIN) 0.125 MG tablet Take 1 tablet (125 mcg total) by mouth daily.  90 tablet  3  . furosemide (LASIX) 40 MG tablet Take 1 & 1/2 tabs in AM and 1 tab in PM  230 tablet  3  . losartan (COZAAR) 50 MG tablet Take 1 tablet (50 mg total) by mouth daily.  90 tablet  3  . multivitamin-iron-minerals-folic acid (THERAPEUTIC-M) TABS tablet Take 1 tablet by mouth daily.      . Omega-3 Fatty Acids (FISH OIL) 300 MG CAPS Take 1 capsule by mouth daily.      Marland Kitchen omeprazole (PRILOSEC) 20 MG capsule Take 20 mg by mouth as needed.       . potassium chloride SA (K-DUR,KLOR-CON) 20 MEQ tablet Take 10 mEq by mouth daily.      . simvastatin (ZOCOR) 40 MG tablet Take 1 tablet (40 mg total) by mouth every evening.  90 tablet  3  . spironolactone (ALDACTONE) 25 MG tablet Take 1 tablet (25 mg total) by  mouth daily.  90 tablet  3  . zolpidem (AMBIEN) 10 MG tablet Take 10 mg by mouth at bedtime as needed. For sleep       No current facility-administered medications for this encounter.     PHYSICAL EXAM: Filed Vitals:   10/27/12 0922  BP: 92/58  Pulse: 72  Weight: 197 lb 4 oz (89.472 kg)  SpO2: 97%   General:  Well appearing. No resp difficulty HEENT: normal Neck: supple. JVP flat. Carotids 2+ bilaterally; no bruits. No lymphadenopathy or thryomegaly appreciated. Cor: PMI normal. Regular rate & rhythm. No rubs, gallops or murmurs.  Lungs: clear Abdomen: soft, nontender, nondistended. No hepatosplenomegaly. No bruits or masses. Good bowel sounds. Extremities: no cyanosis, clubbing, rash, edema Neuro: alert & orientedx3, cranial nerves grossly intact. Moves all 4 extremities w/o difficulty. Affect  pleasant.   ASSESSMENT & PLAN:

## 2012-10-27 NOTE — Assessment & Plan Note (Addendum)
Offered sleep study however she wants to wait. She will call back if wishes to procede.   Attending: Doubt fatigue is related to her heart. We discussed the possibility of OSA and getting a sleep study. She would like to defer at this time.

## 2012-11-01 ENCOUNTER — Encounter: Payer: Self-pay | Admitting: *Deleted

## 2012-11-07 ENCOUNTER — Telehealth (HOSPITAL_COMMUNITY): Payer: Medicare PPO

## 2012-11-30 ENCOUNTER — Encounter: Payer: Self-pay | Admitting: Internal Medicine

## 2012-11-30 ENCOUNTER — Other Ambulatory Visit: Payer: Self-pay | Admitting: Cardiology

## 2012-11-30 MED ORDER — POTASSIUM CHLORIDE CRYS ER 20 MEQ PO TBCR: 10.0000 meq | EXTENDED_RELEASE_TABLET | Freq: Every day | ORAL | Status: DC

## 2012-12-12 ENCOUNTER — Telehealth (HOSPITAL_COMMUNITY): Payer: Medicare PPO

## 2012-12-26 ENCOUNTER — Encounter (HOSPITAL_COMMUNITY): Payer: Self-pay | Admitting: Cardiology

## 2012-12-27 ENCOUNTER — Ambulatory Visit (HOSPITAL_COMMUNITY)
Admission: RE | Admit: 2012-12-27 | Discharge: 2012-12-27 | Disposition: A | Discharge status: Home / Self Care | Payer: Medicare PPO | Source: Ambulatory Visit | Attending: Internal Medicine | Admitting: Internal Medicine

## 2012-12-27 DIAGNOSIS — I5022 Chronic systolic (congestive) heart failure: Secondary | ICD-10-CM

## 2012-12-27 DIAGNOSIS — Z95810 Presence of automatic (implantable) cardiac defibrillator: Secondary | ICD-10-CM

## 2012-12-28 ENCOUNTER — Encounter (HOSPITAL_COMMUNITY): Payer: Self-pay | Admitting: *Deleted

## 2012-12-28 NOTE — Progress Notes (Signed)
Heart Failure Diagnostics Follow Up Form - Med[employer]    Fluid Index        _X__   Fluid index trending with baseline       ___   Fluid index trending up but below threshold       ___   Fluid index above threshold       ___   More than 3 threshold crossings this year        Current diuretic dose: .        Lasxix 60 mg AM and 40 mg PM                                                                                          Baseline weight: .                                      Current Weight: .                                     Patient Activity        Current activity level: .          5 hours                                                Baseline activity level: .          5 hours                                                AT/AF Burden        ___  Increase in AT/AF burden       ___  New onset of AT/AF       _X__  No AT/AF         ICD Therapies        ___ 1 or more ICD shocks    Interventions       ___  Adjust medications: .                                                                                                   ___  Labs: .  ___  Need for follow up appointment: .                                                                                _X__  HF diagnostics follow up: .   01/30/13                                                                                       ___  Review with EP: Marland Kitchen                                                                                                         Patient notified: Date: . 12/28/12                               By: .       Phone   .  X     Letter

## 2013-01-06 ENCOUNTER — Encounter: Payer: Self-pay | Admitting: Internal Medicine

## 2013-01-09 ENCOUNTER — Telehealth (HOSPITAL_COMMUNITY): Payer: Medicare PPO

## 2013-01-31 ENCOUNTER — Ambulatory Visit (HOSPITAL_COMMUNITY)
Admission: RE | Admit: 2013-01-31 | Discharge: 2013-01-31 | Disposition: A | Discharge status: Home / Self Care | Payer: Medicare PPO | Source: Ambulatory Visit | Attending: Internal Medicine | Admitting: Internal Medicine

## 2013-01-31 DIAGNOSIS — I5022 Chronic systolic (congestive) heart failure: Secondary | ICD-10-CM

## 2013-01-31 DIAGNOSIS — Z95810 Presence of automatic (implantable) cardiac defibrillator: Secondary | ICD-10-CM

## 2013-01-31 NOTE — Progress Notes (Signed)
Heart Failure Diagnostics Follow Up Form - [employer]    Fluid Index        ___   Fluid index trending with baseline       _X__   Fluid index trending up but below threshold       ___   Fluid index above threshold       ___   More than 3 threshold crossings this year        Current diuretic dose: .    Lasix 1 & 1/2 tabs BID                                                                                              Baseline weight: .                                      Current Weight: .                                     Patient Activity        Current activity level: .            4 hours daily                                               Baseline activity level: .          4 hours daily                                                AT/AF Burden        ___  Increase in AT/AF burden       ___  New onset of AT/AF       _X__  No AT/AF         ICD Therapies        ___ 1 or more ICD shocks    Interventions       ___  Adjust medications: .                                                                                                   ___  Labs: .  ___  Need for follow up appointment: .                                                                                ___  HF diagnostics follow up: .                                                                                          ___  Review with EP: Marland Kitchen                                                                                                    Per Dr Haroldine Laws this is ok if pt is having symptoms she can take extra Lasix as he has discussed with her previously, pt is aware and agreeable      Patient notified: Date: .     01/31/13                           By: .  Rhunette Croft     Phone   .       Letter

## 2013-02-06 ENCOUNTER — Telehealth (HOSPITAL_COMMUNITY): Payer: Medicare PPO

## 2013-02-20 ENCOUNTER — Encounter: Payer: Self-pay | Admitting: Internal Medicine

## 2013-03-07 ENCOUNTER — Encounter (HOSPITAL_COMMUNITY): Payer: Self-pay | Admitting: *Deleted

## 2013-03-07 ENCOUNTER — Ambulatory Visit (HOSPITAL_COMMUNITY)
Admission: RE | Admit: 2013-03-07 | Discharge: 2013-03-07 | Disposition: A | Discharge status: Home / Self Care | Payer: Medicare PPO | Source: Ambulatory Visit | Attending: Internal Medicine | Admitting: Internal Medicine

## 2013-03-07 DIAGNOSIS — Z95810 Presence of automatic (implantable) cardiac defibrillator: Secondary | ICD-10-CM

## 2013-03-07 DIAGNOSIS — I5022 Chronic systolic (congestive) heart failure: Secondary | ICD-10-CM

## 2013-03-07 DIAGNOSIS — I428 Other cardiomyopathies: Secondary | ICD-10-CM

## 2013-03-07 NOTE — Progress Notes (Signed)
Heart Failure Diagnostics Follow Up Form - [employer]    Fluid Index        _X__   Fluid index trending with baseline       ___   Fluid index trending up but below threshold       ___   Fluid index above threshold       ___   More than 3 threshold crossings this year        Current diuretic dose: Marland Kitchen                                                                                                  Baseline weight: .                                      Current Weight: .                                     Patient Activity        Current activity level: .        6 hours daily                                                  Baseline activity level: .        6 hours daily                                                  AT/AF Burden        ___  Increase in AT/AF burden       ___  New onset of AT/AF       _X__  No AT/AF         ICD Therapies        ___ 1 or more ICD shocks    Interventions       ___  Adjust medications: .                                                                                                   ___  Labs: .  ___  Need for follow up appointment: .                                                                                _X__  HF diagnostics follow up: .       04/10/13                                                                                   ___  Review with EP: Marland Kitchen                                                                                                         Patient notified: Date: .       03/07/13                         By: .       Phone   .  X     Letter

## 2013-03-13 ENCOUNTER — Telehealth (HOSPITAL_COMMUNITY): Payer: Medicare PPO

## 2013-03-15 ENCOUNTER — Encounter: Payer: Self-pay | Admitting: Internal Medicine

## 2013-03-24 ENCOUNTER — Encounter: Payer: Self-pay | Admitting: Internal Medicine

## 2013-03-24 ENCOUNTER — Ambulatory Visit (INDEPENDENT_AMBULATORY_CARE_PROVIDER_SITE_OTHER): Payer: Medicare PPO | Admitting: Internal Medicine

## 2013-03-24 VITALS — BP 103/61 | HR 76 | Ht 60.0 in | Wt 193.0 lb

## 2013-03-24 DIAGNOSIS — I5023 Acute on chronic systolic (congestive) heart failure: Secondary | ICD-10-CM

## 2013-03-24 DIAGNOSIS — Z95810 Presence of automatic (implantable) cardiac defibrillator: Secondary | ICD-10-CM

## 2013-03-24 DIAGNOSIS — I472 Ventricular tachycardia, unspecified: Secondary | ICD-10-CM

## 2013-03-24 DIAGNOSIS — I428 Other cardiomyopathies: Secondary | ICD-10-CM

## 2013-03-24 DIAGNOSIS — T82198A Other mechanical complication of other cardiac electronic device, initial encounter: Secondary | ICD-10-CM

## 2013-03-24 LAB — ICD DEVICE OBSERVATION
AL THRESHOLD: 0.5 V
ATRIAL PACING ICD: 10.92 pct
BAMS-0001: 170 {beats}/min
BATTERY VOLTAGE: 3.1402 V
FVT: 0
LV LEAD THRESHOLD: 1 V
PACEART VT: 0
RV LEAD THRESHOLD: 0.5 V
TOT-0006: 20130410000000
TZAT-0001ATACH: 1
TZAT-0001ATACH: 2
TZAT-0001SLOWVT: 1
TZAT-0001SLOWVT: 2
TZAT-0002ATACH: NEGATIVE
TZAT-0002ATACH: NEGATIVE
TZAT-0002FASTVT: NEGATIVE
TZAT-0004SLOWVT: 8
TZAT-0004SLOWVT: 8
TZAT-0005SLOWVT: 88 pct
TZAT-0005SLOWVT: 91 pct
TZAT-0011SLOWVT: 10 ms
TZAT-0013SLOWVT: 3
TZAT-0018ATACH: NEGATIVE
TZAT-0018ATACH: NEGATIVE
TZAT-0018ATACH: NEGATIVE
TZAT-0018FASTVT: NEGATIVE
TZAT-0019ATACH: 6 V
TZAT-0019FASTVT: 8 V
TZAT-0020FASTVT: 1.5 ms
TZON-0003ATACH: 350 ms
TZON-0004SLOWVT: 40
TZON-0004VSLOWVT: 20
TZST-0001FASTVT: 6
TZST-0001SLOWVT: 3
TZST-0001SLOWVT: 6
TZST-0002ATACH: NEGATIVE
TZST-0002ATACH: NEGATIVE
TZST-0002FASTVT: NEGATIVE
TZST-0002FASTVT: NEGATIVE
TZST-0002FASTVT: NEGATIVE
TZST-0003SLOWVT: 35 J
VENTRICULAR PACING ICD: 99.92 pct
VF: 0

## 2013-03-24 LAB — BASIC METABOLIC PANEL
BUN: 12 mg/dL (ref 6–23)
CO2: 30 mEq/L (ref 19–32)
Glucose, Bld: 150 mg/dL — ABNORMAL HIGH (ref 70–99)
Potassium: 3.8 mEq/L (ref 3.5–5.1)
Sodium: 136 mEq/L (ref 135–145)

## 2013-03-24 NOTE — Assessment & Plan Note (Signed)
Continued episodes of nonsustained ventricular tachycardia; no therapies

## 2013-03-24 NOTE — Assessment & Plan Note (Signed)
Class 2-3 heart failure. On guideline directed therapy. With her being on Aldactone, we'll check a potassium level. Will also check her digoxin level.

## 2013-03-24 NOTE — Patient Instructions (Signed)
Your physician wants you to follow-up in: [place] will receive a reminder letter in the mail two months in advance. If you don't receive a letter, please call our office to schedule the follow-up appointment.   Remote monitoring is used to monitor your Pacemaker or ICD from home. This monitoring reduces the number of office visits required to check your device to one time per year. It allows [place] to keep an eye on the functioning of your device to ensure it is working properly. You are scheduled for a device check from home on 06-26-13. You may send your transmission at any time that day. If you have a wireless device, the transmission will be sent automatically. After your physician reviews your transmission, you will receive a postcard with your next transmission date.

## 2013-03-24 NOTE — Assessment & Plan Note (Signed)
The patient's device was interrogated and the information was fully reviewed.  The device was not reprogrammed.

## 2013-03-24 NOTE — Progress Notes (Signed)
Patient Care Team: [name] as PCP - General   HPI  [name] is a 57 y.o. female seen today in follow-up for congestive heart failure in the setting of nonischemic cardiomyopathy. She is status post CRTD implantation about 4 years ago.She has a sprint Fidelis lead she reached ERI in Aprill 2013  We reversed the rate sense portion of the RV and LV leads   The patient denies chest pain, nocturnal dyspnea, orthopnea.  She has intermittent peripheral edema and mild dyspnea on exertion that she controls by limiting her pacer exercise  There have been no palpitations, lightheadedness or syncope.      She underwent catheterization 12/10 , coronaries were normal and filling pressures were normal  [facility] study in 2007 normal perfusion by report. Echo 5/12 EF remains in the 55-60% range  Medical history notable for breast CA in 1998 treated with Adriamycin x 4 cycles.     Past Medical History  Diagnosis Date  . CHF (congestive heart failure)     due to non-ischemic CM (possible anthracycline toxicity) - dx'd 2007 (EF 25%); cath 12/10. Coronaries normal. LVEF 50%; Right atrial pressure mean of 7, RV pressure 28/6 . Pulmonary artery pressure was 23/9 with mean of  15.  Pulmonary capillary wedge pressure mean of 9. Fick cardiac output 3.9 L per minute.  Cardiac index  2.2 L per minute per meter squared.  Pulmonary vascular resistance was 1.5  . Breast cancer     s/p mastectomy and chemo with adriamycin (1998)  . Obesity   . NSVT (nonsustained ventricular tachycardia)   . Biventricular implantable cardiac defibrillator)     Medtronic  . Nonischemic cardiomyopathy     EF 55% echo 2012  . sprint Fidelis     Past Surgical History  Procedure Laterality Date  . Biv icd      Medtronic    Current Outpatient Prescriptions  Medication Sig Dispense Refill  . allopurinol (ZYLOPRIM) 300 MG tablet Take 300 mg by mouth daily.        . carvedilol (COREG) 12.5 MG tablet Take 1 tablet  (12.5 mg total) by mouth 2 (two) times daily with a meal.  180 tablet  3  . citalopram (CELEXA) 40 MG tablet Take 40 mg by mouth daily.        . digoxin (LANOXIN) 0.125 MG tablet Take 1 tablet (125 mcg total) by mouth daily.  90 tablet  3  . furosemide (LASIX) 40 MG tablet Take 1 & 1/2 tabs in AM and 1 tab in PM  230 tablet  3  . losartan (COZAAR) 50 MG tablet Take 1 tablet (50 mg total) by mouth daily.  90 tablet  3  . multivitamin-iron-minerals-folic acid (THERAPEUTIC-M) TABS tablet Take 1 tablet by mouth daily.      . Omega-3 Fatty Acids (FISH OIL) 300 MG CAPS Take 1 capsule by mouth daily.      Marland Kitchen omeprazole (PRILOSEC) 20 MG capsule Take 20 mg by mouth as needed.       . potassium chloride SA (K-DUR,KLOR-CON) 20 MEQ tablet Take 0.5 tablets (10 mEq total) by mouth daily.  30 tablet  3  . simvastatin (ZOCOR) 40 MG tablet Take 1 tablet (40 mg total) by mouth every evening.  90 tablet  3  . spironolactone (ALDACTONE) 25 MG tablet Take 1 tablet (25 mg total) by mouth daily.  90 tablet  3  . zolpidem (AMBIEN) 10 MG tablet Take 10 mg by mouth at bedtime  as needed. For sleep       No current facility-administered medications for this visit.    No Known Allergies  Review of Systems negative except from HPI and PMH  Physical Exam BP 103/61  Pulse 76  Ht 5' (1.524 m)  Wt 193 lb (87.544 kg)  BMI 37.69 kg/m2 Well developed and nourished in no acute distress HENT normal Neck supple with JVP-flat Clear Regular rate and rhythm, no murmurs or gallops Abd-soft with active BS No Clubbing cyanosis edema Skin-warm and dry A & Oriented  Grossly normal sensory and motor function  ECG demonstrates P. Synchronous pacing with a biventricular configuration   Assessment and  Plan

## 2013-03-24 NOTE — Assessment & Plan Note (Signed)
As noted, the RV and LV rate sense portion were switched

## 2013-03-30 ENCOUNTER — Other Ambulatory Visit: Payer: Self-pay | Admitting: *Deleted

## 2013-03-30 DIAGNOSIS — I472 Ventricular tachycardia: Secondary | ICD-10-CM

## 2013-04-05 ENCOUNTER — Other Ambulatory Visit (HOSPITAL_COMMUNITY): Payer: Self-pay | Admitting: *Deleted

## 2013-04-05 DIAGNOSIS — I5022 Chronic systolic (congestive) heart failure: Secondary | ICD-10-CM

## 2013-04-05 MED ORDER — FUROSEMIDE 40 MG PO TABS: ORAL_TABLET | ORAL | Status: DC

## 2013-04-06 ENCOUNTER — Other Ambulatory Visit: Payer: Self-pay | Admitting: *Deleted

## 2013-04-06 MED ORDER — POTASSIUM CHLORIDE CRYS ER 20 MEQ PO TBCR: 10.0000 meq | EXTENDED_RELEASE_TABLET | Freq: Every day | ORAL | Status: DC

## 2013-04-10 ENCOUNTER — Telehealth (HOSPITAL_COMMUNITY): Payer: Medicare PPO

## 2013-04-11 ENCOUNTER — Ambulatory Visit (HOSPITAL_COMMUNITY)
Admission: RE | Admit: 2013-04-11 | Discharge: 2013-04-11 | Disposition: A | Discharge status: Home / Self Care | Payer: Medicare PPO | Source: Ambulatory Visit | Attending: Internal Medicine | Admitting: Internal Medicine

## 2013-04-20 ENCOUNTER — Ambulatory Visit (HOSPITAL_COMMUNITY)
Admission: RE | Admit: 2013-04-20 | Discharge: 2013-04-20 | Disposition: A | Discharge status: Home / Self Care | Payer: Medicare PPO | Source: Ambulatory Visit | Attending: Internal Medicine | Admitting: Internal Medicine

## 2013-04-20 ENCOUNTER — Ambulatory Visit (HOSPITAL_BASED_OUTPATIENT_CLINIC_OR_DEPARTMENT_OTHER)
Admission: RE | Admit: 2013-04-20 | Discharge: 2013-04-20 | Disposition: A | Discharge status: Home / Self Care | Payer: Medicare PPO | Source: Ambulatory Visit | Attending: Internal Medicine | Admitting: Internal Medicine

## 2013-04-20 VITALS — BP 94/56 | HR 85 | Wt 195.5 lb

## 2013-04-20 DIAGNOSIS — Z950 Presence of cardiac pacemaker: Secondary | ICD-10-CM | POA: Insufficient documentation

## 2013-04-20 DIAGNOSIS — I509 Heart failure, unspecified: Secondary | ICD-10-CM | POA: Insufficient documentation

## 2013-04-20 DIAGNOSIS — R0602 Shortness of breath: Secondary | ICD-10-CM | POA: Insufficient documentation

## 2013-04-20 DIAGNOSIS — Z853 Personal history of malignant neoplasm of breast: Secondary | ICD-10-CM | POA: Insufficient documentation

## 2013-04-20 DIAGNOSIS — I428 Other cardiomyopathies: Secondary | ICD-10-CM | POA: Insufficient documentation

## 2013-04-20 DIAGNOSIS — I9589 Other hypotension: Secondary | ICD-10-CM | POA: Insufficient documentation

## 2013-04-20 DIAGNOSIS — I5022 Chronic systolic (congestive) heart failure: Secondary | ICD-10-CM

## 2013-04-20 DIAGNOSIS — I472 Ventricular tachycardia, unspecified: Secondary | ICD-10-CM | POA: Insufficient documentation

## 2013-04-20 DIAGNOSIS — I4729 Other ventricular tachycardia: Secondary | ICD-10-CM | POA: Insufficient documentation

## 2013-04-20 DIAGNOSIS — I519 Heart disease, unspecified: Secondary | ICD-10-CM

## 2013-04-20 NOTE — Patient Instructions (Addendum)
Stop digoxin.  Take an extra lasix today and tomorrow.  F/U 6 months

## 2013-04-20 NOTE — Progress Notes (Signed)
*  PRELIMINARY RESULTS* Echocardiogram 2D Echocardiogram has been performed.  Leavy Cella 04/20/2013, 1:36 PM

## 2013-04-20 NOTE — Progress Notes (Signed)
Patient ID: AMIIRA LIER, female   DOB: May 18, 1956, 57 y.o.   MRN: [id number] PCP: Dr Mammie Russian  HPI: [name] is a 57 y/o woman with history of breast CA in 1998 treated with Adriamycin x 4 cycles. In 2007, diagnosed with CHF with EF 25-35%. She is s/p Medtronic BiV-ICD in 2008 with Sprint Fidelis lead. LV function has subsequently recovered  Had cath 12/10. Coronaries normal. Right atrial pressure mean of 7, RV pressure 28/6 . Pulmonary artery pressure was 23/9 with mean of 15.  Pulmonary capillary wedge pressure mean of 9. Fick cardiac output 3.9 L per minute.  Cardiac index 2.2 L per minute per meter squared.  Pulmonary vascular resistance was 1.5 Woods units.  Echo 5/11: EF 50-55%  12/15/11 ECHO EF 50%. RV normal. 12/16/11 ICD generator replaced due to EOL 04/2013 EF 50-55%  Follow up: Overall feeling pretty good. Weight at home 190-193 lbs. Fatigue has gotten somewhat better. Reports can perform all ADLs without any SOB, however with exertion such as vacuuming or going up stairs she has SOB. Denies CP or Orthopnea. Takes an extra lasix when she has edema. Complaint with medications.    Optivol- no crossings over threshold, however is starting to increase. Activity 6-7 hours per day  ROS: All systems negative except as listed in HPI, PMH and Problem List.  Past Medical History  Diagnosis Date  . CHF (congestive heart failure)     due to non-ischemic CM (possible anthracycline toxicity) - dx'd 2007 (EF 25%); cath 12/10. Coronaries normal. LVEF 50%; Right atrial pressure mean of 7, RV pressure 28/6 . Pulmonary artery pressure was 23/9 with mean of  15.  Pulmonary capillary wedge pressure mean of 9. Fick cardiac output 3.9 L per minute.  Cardiac index  2.2 L per minute per meter squared.  Pulmonary vascular resistance was 1.5  . Breast cancer     s/p mastectomy and chemo with adriamycin (1998)  . Obesity   . NSVT (nonsustained ventricular tachycardia)   . Biventricular implantable cardiac  defibrillator)     Medtronic  . Nonischemic cardiomyopathy     EF 55% echo 2012  . sprint Fidelis     Current Outpatient Prescriptions  Medication Sig Dispense Refill  . allopurinol (ZYLOPRIM) 300 MG tablet Take 300 mg by mouth daily.        . carvedilol (COREG) 12.5 MG tablet Take 1 tablet (12.5 mg total) by mouth 2 (two) times daily with a meal.  180 tablet  3  . citalopram (CELEXA) 40 MG tablet Take 40 mg by mouth daily.        . digoxin (LANOXIN) 0.125 MG tablet Take 1/2 tablet by mouth once daily      . furosemide (LASIX) 40 MG tablet Take 1 & 1/2 tabs in AM and 1 tab in PM  230 tablet  3  . losartan (COZAAR) 50 MG tablet Take 1 tablet (50 mg total) by mouth daily.  90 tablet  3  . multivitamin-iron-minerals-folic acid (THERAPEUTIC-M) TABS tablet Take 1 tablet by mouth daily.      Marland Kitchen omeprazole (PRILOSEC) 20 MG capsule Take 20 mg by mouth as needed.       . potassium chloride SA (K-DUR,KLOR-CON) 20 MEQ tablet Take 0.5 tablets (10 mEq total) by mouth daily.  45 tablet  1  . simvastatin (ZOCOR) 40 MG tablet Take 1 tablet (40 mg total) by mouth every evening.  90 tablet  3  . spironolactone (ALDACTONE) 25 MG tablet Take 1  tablet (25 mg total) by mouth daily.  90 tablet  3  . zolpidem (AMBIEN) 10 MG tablet Take 10 mg by mouth at bedtime as needed. For sleep       No current facility-administered medications for this encounter.   PHYSICAL EXAM: Filed Vitals:   04/20/13 1352  BP: 94/56  Pulse: 85  Weight: 195 lb 8 oz (88.678 kg)  SpO2: 97%   General:  Well appearing. No resp difficulty HEENT: normal Neck: supple. JVP flat 6-7 Carotids 2+ bilaterally; no bruits. No lymphadenopathy or thryomegaly appreciated. Cor: PMI normal. Regular rate & rhythm. No rubs, gallops or murmurs.  Lungs: clear Abdomen: soft, nontender, nondistended. No hepatosplenomegaly. No bruits or masses. Good bowel sounds. Extremities: no cyanosis, clubbing, rash, trace edema Neuro: alert & orientedx3, cranial  nerves grossly intact. Moves all 4 extremities w/o difficulty. Affect pleasant.  ASSESSMENT & PLAN:  1) Chronic diastolic HF, EF 99991111 - NYHA II symptoms. Optivol interrogated and fluid status is slightly elevated. Will have patient take extra lasix for two days. Patient is very aware of when her fluid is rising and how to manage with sliding scale diuretics. She has been drinking lots of Kool-Aid and Gatorade and discussed trying to cut fluids back to less than 2L. - Functionally she is doing great and EF is nl. Will stop digoxin. Continue BB and Spiro.  [name] B NP-C 6:47 PM  Patient seen and examined with Natasha Bame, NP. We discussed all aspects of the encounter. I agree with the assessment and plan as stated above.  I reviewed echo personally in Clinic and reviewed Optivol tracings. EF 50%. She continues to do well. Reinforced need for daily weights, fluid restriction and reviewed use of sliding scale diuretics. Agree with med changes as above.   Natasha Bensimhon,MD 5:11 PM

## 2013-04-21 NOTE — Progress Notes (Signed)
This encounter was created in error - please disregard.

## 2013-05-08 ENCOUNTER — Telehealth (HOSPITAL_COMMUNITY): Payer: Medicare PPO

## 2013-05-23 ENCOUNTER — Telehealth (HOSPITAL_COMMUNITY): Payer: Medicare PPO

## 2013-06-05 ENCOUNTER — Other Ambulatory Visit (HOSPITAL_COMMUNITY): Payer: Self-pay | Admitting: *Deleted

## 2013-06-05 MED ORDER — FUROSEMIDE 40 MG PO TABS: 60.0000 mg | ORAL_TABLET | Freq: Two times a day (BID) | ORAL | Status: DC

## 2013-06-26 ENCOUNTER — Ambulatory Visit (INDEPENDENT_AMBULATORY_CARE_PROVIDER_SITE_OTHER): Payer: Medicare PPO | Admitting: *Deleted

## 2013-06-26 ENCOUNTER — Encounter: Payer: Self-pay | Admitting: Internal Medicine

## 2013-06-26 DIAGNOSIS — Z95810 Presence of automatic (implantable) cardiac defibrillator: Secondary | ICD-10-CM

## 2013-06-26 DIAGNOSIS — I5022 Chronic systolic (congestive) heart failure: Secondary | ICD-10-CM

## 2013-06-26 DIAGNOSIS — I428 Other cardiomyopathies: Secondary | ICD-10-CM

## 2013-06-26 DIAGNOSIS — I472 Ventricular tachycardia: Secondary | ICD-10-CM

## 2013-06-30 ENCOUNTER — Encounter: Payer: Self-pay | Admitting: Internal Medicine

## 2013-07-06 LAB — REMOTE ICD DEVICE
AL AMPLITUDE: 2.1 mv
AL THRESHOLD: 0.75 V
BATTERY VOLTAGE: 3.1048 V
LV LEAD IMPEDENCE ICD: 760 Ohm
RV LEAD AMPLITUDE: 17.4 mv
RV LEAD IMPEDENCE ICD: 551 Ohm
RV LEAD THRESHOLD: 1.5 V
TOT-0006: 20130410000000
TZAT-0001ATACH: 1
TZAT-0001ATACH: 2
TZAT-0001ATACH: 3
TZAT-0001FASTVT: 1
TZAT-0001SLOWVT: 1
TZAT-0001SLOWVT: 2
TZAT-0002ATACH: NEGATIVE
TZAT-0002ATACH: NEGATIVE
TZAT-0002FASTVT: NEGATIVE
TZAT-0005SLOWVT: 88 pct
TZAT-0005SLOWVT: 91 pct
TZAT-0012ATACH: 150 ms
TZAT-0013SLOWVT: 3
TZAT-0013SLOWVT: 3
TZAT-0018ATACH: NEGATIVE
TZAT-0018ATACH: NEGATIVE
TZAT-0018ATACH: NEGATIVE
TZAT-0018FASTVT: NEGATIVE
TZAT-0018SLOWVT: NEGATIVE
TZAT-0018SLOWVT: NEGATIVE
TZAT-0019ATACH: 6 V
TZAT-0019ATACH: 6 V
TZAT-0019FASTVT: 8 V
TZAT-0019SLOWVT: 8 V
TZAT-0020SLOWVT: 1.5 ms
TZAT-0020SLOWVT: 1.5 ms
TZON-0003VSLOWVT: 450 ms
TZON-0004VSLOWVT: 20
TZON-0005SLOWVT: 12
TZST-0001ATACH: 5
TZST-0001ATACH: 6
TZST-0001FASTVT: 3
TZST-0001FASTVT: 4
TZST-0001FASTVT: 5
TZST-0001FASTVT: 6
TZST-0001SLOWVT: 3
TZST-0001SLOWVT: 5
TZST-0001SLOWVT: 6
TZST-0002ATACH: NEGATIVE
TZST-0002ATACH: NEGATIVE
TZST-0002FASTVT: NEGATIVE
TZST-0002FASTVT: NEGATIVE
TZST-0003SLOWVT: 25 J
TZST-0003SLOWVT: 35 J
VF: 0

## 2013-07-13 ENCOUNTER — Other Ambulatory Visit: Payer: Self-pay

## 2013-07-14 ENCOUNTER — Encounter: Payer: Self-pay | Admitting: *Deleted

## 2013-07-18 ENCOUNTER — Encounter (HOSPITAL_COMMUNITY): Payer: Self-pay | Admitting: *Deleted

## 2013-07-18 ENCOUNTER — Ambulatory Visit (HOSPITAL_COMMUNITY)
Admission: RE | Admit: 2013-07-18 | Discharge: 2013-07-18 | Disposition: A | Discharge status: Home / Self Care | Payer: Medicare PPO | Source: Ambulatory Visit | Attending: Internal Medicine | Admitting: Internal Medicine

## 2013-07-18 DIAGNOSIS — I428 Other cardiomyopathies: Secondary | ICD-10-CM

## 2013-07-18 DIAGNOSIS — I5022 Chronic systolic (congestive) heart failure: Secondary | ICD-10-CM

## 2013-07-18 NOTE — Progress Notes (Signed)
Heart Failure Diagnostics Follow Up Form - [employer]    Fluid Index        _X__   Fluid index trending with baseline       ___   Fluid index trending up but below threshold       ___   Fluid index above threshold       ___   More than 3 threshold crossings this year        Current diuretic dose: .   Lasix 60 mg BID, Spiro 25 mg daily                                                                                               Baseline weight: .                                      Current Weight: .                                     Patient Activity        Current activity level: .       4 & 1/2 hours                                                   Baseline activity level: .      5 hours                                                     AT/AF Burden        ___  Increase in AT/AF burden       ___  New onset of AT/AF       _X_  No AT/AF         ICD Therapies        ___ 1 or more ICD shocks    Interventions       ___  Adjust medications: .                                                                                                   ___  Labs: .  ___  Need for follow up appointment: .                                                                                _X__  HF diagnostics follow up: .        08/24/13                                                                                  ___  Review with EP: Marland Kitchen                                                                                                         Patient notified: Date: .     07/18/13                           By: .       Phone   . X      Letter

## 2013-08-10 ENCOUNTER — Encounter: Payer: Self-pay | Admitting: Internal Medicine

## 2013-08-22 ENCOUNTER — Encounter (HOSPITAL_COMMUNITY): Payer: Self-pay | Admitting: *Deleted

## 2013-08-22 ENCOUNTER — Ambulatory Visit (HOSPITAL_COMMUNITY)
Admission: RE | Admit: 2013-08-22 | Discharge: 2013-08-22 | Disposition: A | Discharge status: Home / Self Care | Payer: Medicare PPO | Source: Ambulatory Visit | Attending: Internal Medicine | Admitting: Internal Medicine

## 2013-08-22 DIAGNOSIS — I5022 Chronic systolic (congestive) heart failure: Secondary | ICD-10-CM

## 2013-08-22 DIAGNOSIS — I428 Other cardiomyopathies: Secondary | ICD-10-CM

## 2013-08-22 DIAGNOSIS — Z95810 Presence of automatic (implantable) cardiac defibrillator: Secondary | ICD-10-CM

## 2013-08-22 NOTE — Progress Notes (Signed)
Heart Failure Diagnostics Follow Up Form - [employer]    Fluid Index        _X__   Fluid index trending with baseline       ___   Fluid index trending up but below threshold       ___   Fluid index above threshold       ___   More than 3 threshold crossings this year        Current diuretic dose: Marland Kitchen                                                                                                  Baseline weight: .                                      Current Weight: .                                     Patient Activity        Current activity level: Marland Kitchen                                                          Baseline activity level: .                                                          AT/AF Burden        ___  Increase in AT/AF burden       ___  New onset of AT/AF       _X__  No AT/AF         ICD Therapies        ___ 1 or more ICD shocks    Interventions       ___  Adjust medications: .                                                                                                   ___  Labs: .  ___  Need for follow up appointment: .                                                                                _X__  HF diagnostics follow up: .    1 month                                                                                      ___  Review with EP: Marland Kitchen                                                                                                         Patient notified: Date: .    08/22/13                            By: .       Phone   .  X     Letter

## 2013-09-20 ENCOUNTER — Other Ambulatory Visit: Payer: Self-pay

## 2013-09-20 MED ORDER — SPIRONOLACTONE 25 MG PO TABS: 25.0000 mg | ORAL_TABLET | Freq: Every day | ORAL | Status: DC

## 2013-09-25 ENCOUNTER — Other Ambulatory Visit (HOSPITAL_COMMUNITY): Payer: Self-pay | Admitting: *Deleted

## 2013-09-25 MED ORDER — LOSARTAN POTASSIUM 50 MG PO TABS: 50.0000 mg | ORAL_TABLET | Freq: Every day | ORAL | Status: DC

## 2013-09-26 ENCOUNTER — Ambulatory Visit (HOSPITAL_COMMUNITY)
Admission: RE | Admit: 2013-09-26 | Discharge: 2013-09-26 | Disposition: A | Discharge status: Home / Self Care | Payer: Medicare PPO | Source: Ambulatory Visit | Attending: Internal Medicine | Admitting: Internal Medicine

## 2013-09-26 DIAGNOSIS — I428 Other cardiomyopathies: Secondary | ICD-10-CM

## 2013-09-26 DIAGNOSIS — I5022 Chronic systolic (congestive) heart failure: Secondary | ICD-10-CM

## 2013-09-26 DIAGNOSIS — Z95810 Presence of automatic (implantable) cardiac defibrillator: Secondary | ICD-10-CM

## 2013-09-28 ENCOUNTER — Other Ambulatory Visit (HOSPITAL_COMMUNITY): Payer: Self-pay | Admitting: *Deleted

## 2013-09-28 MED ORDER — LOSARTAN POTASSIUM 50 MG PO TABS: 50.0000 mg | ORAL_TABLET | Freq: Every day | ORAL | Status: DC

## 2013-09-29 ENCOUNTER — Ambulatory Visit (INDEPENDENT_AMBULATORY_CARE_PROVIDER_SITE_OTHER): Payer: Medicare PPO | Admitting: *Deleted

## 2013-09-29 DIAGNOSIS — I428 Other cardiomyopathies: Secondary | ICD-10-CM

## 2013-09-29 DIAGNOSIS — I472 Ventricular tachycardia: Secondary | ICD-10-CM

## 2013-09-29 DIAGNOSIS — I4729 Other ventricular tachycardia: Secondary | ICD-10-CM

## 2013-09-29 DIAGNOSIS — I5022 Chronic systolic (congestive) heart failure: Secondary | ICD-10-CM

## 2013-09-29 LAB — MDC_IDC_ENUM_SESS_TYPE_REMOTE
Brady Statistic AP VS Percent: 0.01 %
Brady Statistic AS VP Percent: 89.83 %
Brady Statistic AS VS Percent: 0.05 %
Brady Statistic RV Percent Paced: 99.94 %
Date Time Interrogation Session: 20150123125414
HIGH POWER IMPEDANCE MEASURED VALUE: 54 Ohm
HIGH POWER IMPEDANCE MEASURED VALUE: 60 Ohm
HighPow Impedance: 304 Ohm
HighPow Impedance: 399 Ohm
Lead Channel Impedance Value: 228 Ohm
Lead Channel Impedance Value: 342 Ohm
Lead Channel Impedance Value: 608 Ohm
Lead Channel Pacing Threshold Amplitude: 0.625 V
Lead Channel Pacing Threshold Amplitude: 0.75 V
Lead Channel Pacing Threshold Amplitude: 1.625 V
Lead Channel Pacing Threshold Pulse Width: 0.4 ms
Lead Channel Sensing Intrinsic Amplitude: 16.625 mV
Lead Channel Sensing Intrinsic Amplitude: 16.625 mV
Lead Channel Sensing Intrinsic Amplitude: 2.75 mV
Lead Channel Setting Pacing Amplitude: 2.5 V
Lead Channel Setting Pacing Amplitude: 3.25 V
Lead Channel Setting Pacing Pulse Width: 0.4 ms
Lead Channel Setting Pacing Pulse Width: 0.4 ms
MDC IDC MSMT BATTERY VOLTAGE: 3.1 V
MDC IDC MSMT LEADCHNL LV IMPEDANCE VALUE: 513 Ohm
MDC IDC MSMT LEADCHNL RA PACING THRESHOLD PULSEWIDTH: 0.4 ms
MDC IDC MSMT LEADCHNL RA SENSING INTR AMPL: 2.75 mV
MDC IDC MSMT LEADCHNL RV IMPEDANCE VALUE: 551 Ohm
MDC IDC MSMT LEADCHNL RV PACING THRESHOLD PULSEWIDTH: 0.4 ms
MDC IDC SET LEADCHNL RA PACING AMPLITUDE: 1.5 V
MDC IDC SET LEADCHNL RV SENSING SENSITIVITY: 0.3 mV
MDC IDC SET ZONE DETECTION INTERVAL: 300 ms
MDC IDC SET ZONE DETECTION INTERVAL: 340 ms
MDC IDC STAT BRADY AP VP PERCENT: 10.1 %
MDC IDC STAT BRADY RA PERCENT PACED: 10.11 %
Zone Setting Detection Interval: 350 ms
Zone Setting Detection Interval: 450 ms

## 2013-09-29 NOTE — Progress Notes (Signed)
Heart Failure Diagnostics Follow Up Form - [employer]    Fluid Index        ___   Fluid index trending with baseline       _X__   Fluid index trending up but below threshold       ___   Fluid index above threshold       ___   More than 3 threshold crossings this year        Current diuretic dose: .    Lasix 60 mg BID                                                                                              Baseline weight: .                                      Current Weight: .                                     Patient Activity        Current activity level: .        5 hours                                                  Baseline activity level: .       5 hours                                                   AT/AF Burden        ___  Increase in AT/AF burden       ___  New onset of AT/AF       _X__  No AT/AF         ICD Therapies        ___ 1 or more ICD shocks    Interventions       _X__  Adjust medications: .  Pt denies symptoms, will increase Lasix to 80 mg BID for 1 day, if dev. Symptoms or wt gain will call us                                                                                                 ___  Labs: .  ___  Need for follow up appointment: .                                                                                ___  HF diagnostics follow up: .                                                                                          ___  Review with EP: Marland Kitchen                                                                                                         Patient notified: Date: .   09/30/23                             By: .  Rhunette Croft     Phone   .       Letter

## 2013-10-03 ENCOUNTER — Encounter: Payer: Self-pay | Admitting: Internal Medicine

## 2013-10-17 ENCOUNTER — Encounter: Payer: Self-pay | Admitting: *Deleted

## 2013-10-25 ENCOUNTER — Encounter: Payer: Self-pay | Admitting: Internal Medicine

## 2013-10-25 ENCOUNTER — Telehealth (HOSPITAL_COMMUNITY): Payer: Self-pay | Admitting: *Deleted

## 2013-10-25 ENCOUNTER — Telehealth: Payer: Self-pay | Admitting: Cardiovascular Disease

## 2013-10-25 NOTE — Telephone Encounter (Signed)
Melissa to refax request for surgery.

## 2013-10-25 NOTE — Telephone Encounter (Signed)
Pt having ganglion cyst removed from finger tomorrow and they need device clearance. ([place] office working on cardiac clearance). I will route this to device, and requested hospital fax them device clearance request.

## 2013-10-25 NOTE — Telephone Encounter (Signed)
Pt needs surgery tomorrow for ganglion cyst removal from finger, has AICD and cardiac clearance fax note to [phone], will send to Dr Haroldine Laws for review

## 2013-10-25 NOTE — Telephone Encounter (Signed)
New message  Patient is having surgery in the morning and they need medical clearance. Please call and advise.

## 2013-10-26 NOTE — Telephone Encounter (Signed)
Cleared from cardiac perspective to proceed with surgery.

## 2013-10-26 NOTE — Telephone Encounter (Signed)
Note faxed.

## 2013-10-31 ENCOUNTER — Ambulatory Visit (HOSPITAL_COMMUNITY)
Admission: RE | Admit: 2013-10-31 | Discharge: 2013-10-31 | Disposition: A | Discharge status: Home / Self Care | Payer: Medicare PPO | Source: Ambulatory Visit | Attending: Internal Medicine | Admitting: Internal Medicine

## 2013-10-31 DIAGNOSIS — I428 Other cardiomyopathies: Secondary | ICD-10-CM

## 2013-10-31 DIAGNOSIS — I5022 Chronic systolic (congestive) heart failure: Secondary | ICD-10-CM

## 2013-11-01 MED ORDER — METOLAZONE 2.5 MG PO TABS: 2.5000 mg | ORAL_TABLET | ORAL | Status: DC

## 2013-11-01 NOTE — Progress Notes (Signed)
Heart Failure Diagnostics Follow Up Form - [employer]    Fluid Index        ___   Fluid index trending with baseline       _X__   Fluid index trending up but below threshold       ___   Fluid index above threshold       ___   More than 3 threshold crossings this year        Current diuretic dose: .    Lasix 60 mg BID, however pt has increased to 80 mg BID starting this week                                                                                              Baseline weight: .                                      Current Weight: .         Up 5 lb                             Patient Activity        Current activity level: .     5 hours                                                     Baseline activity level: .       5 hours                                                   AT/AF Burden        ___  Increase in AT/AF burden       ___  New onset of AT/AF       _X__  No AT/AF         ICD Therapies        ___ 1 or more ICD shocks    Interventions       _X__  Adjust medications: .  Discussed with Junie Bame, NP will have pt take Metolazone 2.5 mg for 1 dose and continue Lasix 80 mg BID, appt sch for next week  _X__  Labs: .      Next week with appt                                                                                                                    _X__  Need for follow up appointment: .  sch for next week                                                                              ___  HF diagnostics follow up: .                                                                                          ___  Review with EP: Marland Kitchen                                                                                                         Patient notified: Date: .     11/01/13                           By: .  Rhunette Croft     Phone   .       Letter

## 2013-11-07 ENCOUNTER — Ambulatory Visit (HOSPITAL_COMMUNITY)
Admission: RE | Admit: 2013-11-07 | Discharge: 2013-11-07 | Disposition: A | Discharge status: Home / Self Care | Payer: Medicare PPO | Source: Ambulatory Visit | Attending: Internal Medicine | Admitting: Internal Medicine

## 2013-11-07 ENCOUNTER — Encounter (HOSPITAL_COMMUNITY): Payer: Self-pay

## 2013-11-07 VITALS — BP 110/70 | HR 86 | Ht 61.0 in | Wt 203.1 lb

## 2013-11-07 DIAGNOSIS — I5022 Chronic systolic (congestive) heart failure: Secondary | ICD-10-CM | POA: Insufficient documentation

## 2013-11-07 DIAGNOSIS — I428 Other cardiomyopathies: Secondary | ICD-10-CM | POA: Insufficient documentation

## 2013-11-07 LAB — BASIC METABOLIC PANEL
BUN: 16 mg/dL (ref 6–23)
CALCIUM: 9.7 mg/dL (ref 8.4–10.5)
CO2: 30 mEq/L (ref 19–32)
Chloride: 98 mEq/L (ref 96–112)
Creatinine, Ser: 0.78 mg/dL (ref 0.50–1.10)
GFR calc Af Amer: >90 mL/min (ref >90)
GFR calc non Af Amer: 90 mL/min — ABNORMAL LOW (ref >90)
GLUCOSE: 100 mg/dL — AB (ref 70–99)
Potassium: 4 mEq/L (ref 3.7–5.3)
Sodium: 140 mEq/L (ref 137–147)

## 2013-11-07 LAB — PRO B NATRIURETIC PEPTIDE: Pro B Natriuretic peptide (BNP): 246.8 pg/mL — ABNORMAL HIGH (ref 0–125)

## 2013-11-07 MED ORDER — FUROSEMIDE 80 MG PO TABS: 80.0000 mg | ORAL_TABLET | Freq: Two times a day (BID) | ORAL | Status: DC

## 2013-11-07 MED ORDER — METOLAZONE 2.5 MG PO TABS: 2.5000 mg | ORAL_TABLET | ORAL | Status: DC | PRN

## 2013-11-07 NOTE — Progress Notes (Signed)
Patient ID: [name], female   DOB: [birth year], 58 y.o.   MRN: [id number] PCP: Dr Raeanne Gathers  HPI: [name] is a 58 y/o woman with history of breast CA in 1998 treated with Adriamycin x 4 cycles. In 2007, diagnosed with CHF with EF 25-35%. She is s/p Medtronic BiV-ICD in 2008 with Sprint [employer] lead. LV function has subsequently recovered  Had cath 12/10. Coronaries normal. Right atrial pressure mean of 7, RV pressure 28/6 . Pulmonary artery pressure was 23/9 with mean of 15.  Pulmonary capillary wedge pressure mean of 9. Fick cardiac output 3.9 L per minute.  Cardiac index 2.2 L per minute per meter squared.  Pulmonary vascular resistance was 1.5 Woods units.  Echo 5/11: EF 50-55%  12/15/11 ECHO EF 50%. RV normal. 12/16/11 ICD generator replaced due to EOL 04/2013 EF 50%  Follow up: Over the last 2 weeks, she developed increased dyspnea.  There was no particular trigger.  Weight was up to 205.  She felt abdominal fullness and was short of breath vacuuming or going up steps.  OK on flat ground.  No chest pain or orthopnea.  Last week, Optivol was checked and fluid index was found to be nearing threshold.  She was given a dose of metolazone and Lasix was increased to 80 mg bid.  She feels better, weight at home down about 8 lbs on her scales.    Optivol: [name] was checked today. Fluid index trending back down and thoracic impedance back up again.   ROS: All systems negative except as listed in HPI, PMH and Problem List.  Past Medical History  Diagnosis Date  . CHF (congestive heart failure)     due to non-ischemic CM (possible anthracycline toxicity) - dx'd 2007 (EF 25%); cath 12/10. Coronaries normal. LVEF 50%; Right atrial pressure mean of 7, RV pressure 28/6 . Pulmonary artery pressure was 23/9 with mean of  15.  Pulmonary capillary wedge pressure mean of 9. Fick cardiac output 3.9 L per minute.  Cardiac index  2.2 L per minute per meter squared.  Pulmonary vascular resistance was 1.5  .  Breast cancer     s/p mastectomy and chemo with adriamycin (1998)  . Obesity   . NSVT (nonsustained ventricular tachycardia)   . Biventricular implantable cardiac defibrillator)     Medtronic  . Nonischemic cardiomyopathy     EF 55% echo 2012  . sprint Fidelis     Current Outpatient Prescriptions  Medication Sig Dispense Refill  . allopurinol (ZYLOPRIM) 300 MG tablet Take 300 mg by mouth daily.        . carvedilol (COREG) 12.5 MG tablet Take 1 tablet (12.5 mg total) by mouth 2 (two) times daily with a meal.  180 tablet  3  . citalopram (CELEXA) 40 MG tablet Take 40 mg by mouth daily.        . furosemide (LASIX) 80 MG tablet Take 1 tablet (80 mg total) by mouth 2 (two) times daily.  180 tablet  1  . losartan (COZAAR) 50 MG tablet Take 1 tablet (50 mg total) by mouth daily.  90 tablet  3  . metolazone (ZAROXOLYN) 2.5 MG tablet Take 1 tablet (2.5 mg total) by mouth as needed. For weight gain of 3 lbs overnight or in 1 week  5 tablet  3  . multivitamin-iron-minerals-folic acid (THERAPEUTIC-M) TABS tablet Take 1 tablet by mouth daily.      Marland Kitchen omeprazole (PRILOSEC) 20 MG capsule Take 20 mg by mouth as needed.       Marland Kitchen  potassium chloride SA (K-DUR,KLOR-CON) 20 MEQ tablet Take 0.5 tablets (10 mEq total) by mouth daily.  45 tablet  1  . simvastatin (ZOCOR) 40 MG tablet Take 1 tablet (40 mg total) by mouth every evening.  90 tablet  3  . spironolactone (ALDACTONE) 25 MG tablet Take 1 tablet (25 mg total) by mouth daily.  90 tablet  3  . zolpidem (AMBIEN) 10 MG tablet Take 5 mg by mouth at bedtime as needed. For sleep       No current facility-administered medications for this encounter.   PHYSICAL EXAM: Filed Vitals:   11/07/13 0905  BP: 110/70  Pulse: 86  Height: 5\' 1"  (1.549 m)  Weight: 203 lb 1.9 oz (92.135 kg)  SpO2: 98%   General:  Well appearing. No resp difficulty HEENT: normal Neck: supple. JVP flat 6-7 Carotids 2+ bilaterally; no bruits. No lymphadenopathy or thryomegaly  appreciated. Cor: PMI normal. Regular rate & rhythm. No rubs, gallops or murmurs.  Lungs: clear Abdomen: soft, nontender, nondistended. No hepatosplenomegaly. No bruits or masses. Good bowel sounds. Extremities: no cyanosis, clubbing, rash, trace edema Neuro: alert & orientedx3, cranial nerves grossly intact. Moves all 4 extremities w/o difficulty. Affect pleasant.  ASSESSMENT & PLAN:  1) Chronic diastolic HF: EF [id number] on last echo, improved post CRT.  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  Patient developed volume overload with NYHA class III symptoms beginning a couple of weeks ago.  No definite trigger, denies dietary indiscretion.  Lasix was increased and she was given metolazone.  Symptomatically she is better and Optivol today is improved.  - Continue Lasix 80 mg po bid, will check BMET/BNP today.  - May use metolazone qwk prn 3 lb weight gain.   - Continue current Coreg, losartan, and spironolactone.  Should have BMET followed every 3-4 months with spironolactone.  2) Medtronic BiV ICD: Has regular EP followup.   Loralie Champagne 11/07/2013 9:35 AM

## 2013-11-07 NOTE — Patient Instructions (Signed)
Continue Lasix 80 mg Twice daily   Take Metolazone 2.5 mg for weight gain of 3 lb overnight or 3 lbs in 1 week  Your physician recommends that you schedule a follow-up appointment in: 1 month

## 2013-11-09 ENCOUNTER — Encounter: Payer: Self-pay | Admitting: Internal Medicine

## 2013-11-10 ENCOUNTER — Encounter: Payer: Self-pay | Admitting: Internal Medicine

## 2013-11-21 ENCOUNTER — Other Ambulatory Visit: Payer: Self-pay | Admitting: *Deleted

## 2013-11-21 ENCOUNTER — Encounter (HOSPITAL_COMMUNITY): Payer: Medicare PPO

## 2013-11-21 MED ORDER — CARVEDILOL 12.5 MG PO TABS: 12.5000 mg | ORAL_TABLET | Freq: Two times a day (BID) | ORAL | Status: DC

## 2013-11-28 ENCOUNTER — Ambulatory Visit (HOSPITAL_COMMUNITY)
Admission: RE | Admit: 2013-11-28 | Discharge: 2013-11-28 | Disposition: A | Discharge status: Home / Self Care | Payer: Medicare PPO | Source: Ambulatory Visit | Attending: Cardiology | Admitting: Cardiology

## 2013-11-28 DIAGNOSIS — I5022 Chronic systolic (congestive) heart failure: Secondary | ICD-10-CM

## 2013-11-28 NOTE — Progress Notes (Signed)
Heart Failure Diagnostics Follow Up Form - [employer]    Fluid Index        _X__   Fluid index trending with baseline       ___   Fluid index trending up but below threshold       ___   Fluid index above threshold       ___   More than 3 threshold crossings this year        Current diuretic dose: Marland Kitchen                                                                                                  Baseline weight: .                                      Current Weight: .                                     Patient Activity        Current activity level: .     4 hours daily                                                     Baseline activity level: .    5 hours daily                                                      AT/AF Burden        ___  Increase in AT/AF burden       ___  New onset of AT/AF       _X__  No AT/AF         ICD Therapies        ___ 1 or more ICD shocks    Interventions       ___  Adjust medications: .                                                                                                   ___  Labs: .  ___  Need for follow up appointment: .   Pt is sch for f/u appt on 3/30                                                                             _X  HF diagnostics follow up: .    01/01/14                                                                                      ___  Review with EP: Marland Kitchen                                                                                                         Patient notified: Date: .   11/28/13                             By: . Rhunette Croft      Phone   .       Letter

## 2013-11-29 ENCOUNTER — Other Ambulatory Visit: Payer: Self-pay | Admitting: *Deleted

## 2013-11-29 MED ORDER — SIMVASTATIN 40 MG PO TABS: 40.0000 mg | ORAL_TABLET | Freq: Every evening | ORAL | Status: DC

## 2013-11-30 ENCOUNTER — Encounter: Payer: Self-pay | Admitting: Cardiology

## 2013-12-04 ENCOUNTER — Encounter (HOSPITAL_COMMUNITY): Payer: Medicare PPO

## 2013-12-26 ENCOUNTER — Other Ambulatory Visit: Payer: Self-pay

## 2013-12-26 MED ORDER — FUROSEMIDE 80 MG PO TABS: 80.0000 mg | ORAL_TABLET | Freq: Two times a day (BID) | ORAL | Status: DC

## 2013-12-27 ENCOUNTER — Other Ambulatory Visit: Payer: Self-pay

## 2013-12-27 ENCOUNTER — Encounter: Payer: Self-pay | Admitting: Internal Medicine

## 2013-12-27 MED ORDER — FUROSEMIDE 80 MG PO TABS: 80.0000 mg | ORAL_TABLET | Freq: Two times a day (BID) | ORAL | Status: DC

## 2014-01-01 ENCOUNTER — Ambulatory Visit (INDEPENDENT_AMBULATORY_CARE_PROVIDER_SITE_OTHER): Payer: Medicare PPO | Admitting: *Deleted

## 2014-01-01 DIAGNOSIS — I428 Other cardiomyopathies: Secondary | ICD-10-CM

## 2014-01-01 DIAGNOSIS — I5023 Acute on chronic systolic (congestive) heart failure: Secondary | ICD-10-CM

## 2014-01-01 LAB — MDC_IDC_ENUM_SESS_TYPE_REMOTE
Battery Voltage: 3.08 V
Brady Statistic AP VP Percent: 9.16 %
Brady Statistic AP VS Percent: 0.02 %
Brady Statistic AS VP Percent: 90.57 %
Brady Statistic RV Percent Paced: 99.74 %
Date Time Interrogation Session: 20150427102046
HIGH POWER IMPEDANCE MEASURED VALUE: 285 Ohm
HIGH POWER IMPEDANCE MEASURED VALUE: 55 Ohm
HIGH POWER IMPEDANCE MEASURED VALUE: 64 Ohm
HighPow Impedance: 399 Ohm
Lead Channel Impedance Value: 228 Ohm
Lead Channel Impedance Value: 361 Ohm
Lead Channel Impedance Value: 513 Ohm
Lead Channel Impedance Value: 589 Ohm
Lead Channel Impedance Value: 646 Ohm
Lead Channel Pacing Threshold Amplitude: 0.875 V
Lead Channel Pacing Threshold Amplitude: 1.375 V
Lead Channel Pacing Threshold Pulse Width: 0.4 ms
Lead Channel Pacing Threshold Pulse Width: 0.4 ms
Lead Channel Sensing Intrinsic Amplitude: 18.5 mV
Lead Channel Sensing Intrinsic Amplitude: 2.625 mV
Lead Channel Setting Pacing Amplitude: 1.5 V
Lead Channel Setting Pacing Amplitude: 2.5 V
Lead Channel Setting Pacing Amplitude: 3 V
Lead Channel Setting Pacing Pulse Width: 0.4 ms
Lead Channel Setting Pacing Pulse Width: 0.4 ms
Lead Channel Setting Sensing Sensitivity: 0.3 mV
MDC IDC MSMT LEADCHNL RA PACING THRESHOLD AMPLITUDE: 0.625 V
MDC IDC MSMT LEADCHNL RV PACING THRESHOLD PULSEWIDTH: 0.4 ms
MDC IDC SET ZONE DETECTION INTERVAL: 350 ms
MDC IDC SET ZONE DETECTION INTERVAL: 450 ms
MDC IDC STAT BRADY AS VS PERCENT: 0.24 %
MDC IDC STAT BRADY RA PERCENT PACED: 9.18 %
Zone Setting Detection Interval: 300 ms
Zone Setting Detection Interval: 340 ms

## 2014-01-02 ENCOUNTER — Encounter: Payer: Self-pay | Admitting: Internal Medicine

## 2014-01-02 ENCOUNTER — Ambulatory Visit (HOSPITAL_COMMUNITY)
Admission: RE | Admit: 2014-01-02 | Discharge: 2014-01-02 | Disposition: A | Discharge status: Home / Self Care | Payer: Medicare PPO | Source: Ambulatory Visit | Attending: Internal Medicine | Admitting: Internal Medicine

## 2014-01-02 DIAGNOSIS — I5022 Chronic systolic (congestive) heart failure: Secondary | ICD-10-CM

## 2014-01-05 NOTE — Progress Notes (Signed)
Heart Failure Diagnostics Follow Up Form - [employer]    Fluid Index        ___   Fluid index trending with baseline       _X__   Fluid index trending up but below threshold, slight increase, pt denies wt gain, edema or SOB, will continue to monitor and call back as needed       ___   Fluid index above threshold       ___   More than 3 threshold crossings this year        Current diuretic dose: Marland Kitchen                                                                                                  Baseline weight: .                                      Current Weight: .                                     Patient Activity        Current activity level: .   4 hours daily                                                       Baseline activity level: . About 4 hours daily                                                         AT/AF Burden        ___  Increase in AT/AF burden       ___  New onset of AT/AF       _X__  No AT/AF         ICD Therapies        ___ 1 or more ICD shocks    Interventions       ___  Adjust medications: .                                                                                                   ___  Labs: .  ___  Need for follow up appointment: .                                                                                _X__  HF diagnostics follow up: .    02/05/14                                                                                      ___  Review with EP: Marland Kitchen                                                                                                         Patient notified: Date: .   01/05/14                             By: . Rhunette Croft      Phone   .       Letter

## 2014-01-19 ENCOUNTER — Encounter: Payer: Self-pay | Admitting: Cardiology

## 2014-01-22 ENCOUNTER — Encounter: Payer: Self-pay | Admitting: Internal Medicine

## 2014-01-23 ENCOUNTER — Encounter: Payer: Self-pay | Admitting: Internal Medicine

## 2014-01-25 ENCOUNTER — Other Ambulatory Visit: Payer: Self-pay | Admitting: *Deleted

## 2014-01-25 MED ORDER — CARVEDILOL 12.5 MG PO TABS: 12.5000 mg | ORAL_TABLET | Freq: Two times a day (BID) | ORAL | Status: DC

## 2014-02-01 ENCOUNTER — Other Ambulatory Visit (HOSPITAL_COMMUNITY): Payer: Self-pay

## 2014-02-01 MED ORDER — SIMVASTATIN 40 MG PO TABS: 40.0000 mg | ORAL_TABLET | Freq: Every evening | ORAL | Status: DC

## 2014-02-06 ENCOUNTER — Ambulatory Visit (HOSPITAL_COMMUNITY)
Admission: RE | Admit: 2014-02-06 | Discharge: 2014-02-06 | Disposition: A | Discharge status: Home / Self Care | Payer: Medicare PPO | Source: Ambulatory Visit | Attending: Internal Medicine | Admitting: Internal Medicine

## 2014-02-06 DIAGNOSIS — I5022 Chronic systolic (congestive) heart failure: Secondary | ICD-10-CM

## 2014-02-06 NOTE — Progress Notes (Signed)
Heart Failure Diagnostics Follow Up Form - [employer]    Fluid Index        ___   Fluid index trending with baseline       ___   Fluid index trending up but below threshold       _X__   Fluid index above threshold, slightly above       ___   More than 3 threshold crossings this year        Current diuretic dose: .   Lasix 80 mg Twice daily, Spiro 25 mg daily, met 2.5 prn                                                                                               Baseline weight: .                                      Current Weight: .     Up a couple of lbs per pt                                Patient Activity        Current activity level: .   5 hours daily                                                       Baseline activity level: .  About 5 hours daily                                                        AT/AF Burden        ___  Increase in AT/AF burden       ___  New onset of AT/AF       _X__  No AT/AF         ICD Therapies        ___ 1 or more ICD shocks    Interventions       _X__  Adjust medications: Pt denies SOB, does have slight edema in ankles, she took Lawrence last week and feels it help get wt down but is slowly increasing again, she will take another metolazone today, she is sch for f/u on 6/17, we also discussed salt and fluid intake  ___  Labs: .                                                                                                                          _X__  Need for follow up appointment: .  As scheduled 6/17                                                                              _X__  HF diagnostics follow up: .   7/6                                                                                       ___  Review with EP: Marland Kitchen                                                                                                         Patient  notified: Date: .   02/06/14                             By: . Rhunette Croft      Phone   .       Letter

## 2014-02-14 ENCOUNTER — Encounter: Payer: Self-pay | Admitting: Pediatrics

## 2014-02-21 ENCOUNTER — Encounter (HOSPITAL_COMMUNITY): Payer: Medicare PPO

## 2014-03-05 ENCOUNTER — Encounter (HOSPITAL_COMMUNITY): Payer: Medicare PPO

## 2014-03-12 ENCOUNTER — Ambulatory Visit (HOSPITAL_COMMUNITY)
Admission: RE | Admit: 2014-03-12 | Discharge: 2014-03-12 | Disposition: A | Discharge status: Home / Self Care | Payer: Medicare PPO | Source: Ambulatory Visit | Attending: Internal Medicine | Admitting: Internal Medicine

## 2014-03-12 VITALS — BP 94/56 | HR 81 | Wt 206.0 lb

## 2014-03-12 DIAGNOSIS — I4729 Other ventricular tachycardia: Secondary | ICD-10-CM | POA: Diagnosis not present

## 2014-03-12 DIAGNOSIS — Z853 Personal history of malignant neoplasm of breast: Secondary | ICD-10-CM | POA: Insufficient documentation

## 2014-03-12 DIAGNOSIS — Z95810 Presence of automatic (implantable) cardiac defibrillator: Secondary | ICD-10-CM | POA: Diagnosis not present

## 2014-03-12 DIAGNOSIS — I472 Ventricular tachycardia, unspecified: Secondary | ICD-10-CM | POA: Insufficient documentation

## 2014-03-12 DIAGNOSIS — Z9010 Acquired absence of unspecified breast and nipple: Secondary | ICD-10-CM | POA: Diagnosis not present

## 2014-03-12 DIAGNOSIS — E669 Obesity, unspecified: Secondary | ICD-10-CM | POA: Insufficient documentation

## 2014-03-12 DIAGNOSIS — I509 Heart failure, unspecified: Secondary | ICD-10-CM | POA: Insufficient documentation

## 2014-03-12 DIAGNOSIS — I5022 Chronic systolic (congestive) heart failure: Secondary | ICD-10-CM | POA: Diagnosis present

## 2014-03-12 LAB — BASIC METABOLIC PANEL
Anion gap: 11 (ref 5–15)
BUN: 12 mg/dL (ref 6–23)
CO2: 32 mEq/L (ref 19–32)
Calcium: 9.8 mg/dL (ref 8.4–10.5)
Chloride: 98 mEq/L (ref 96–112)
Creatinine, Ser: 0.85 mg/dL (ref 0.50–1.10)
GFR calc Af Amer: 85 mL/min — ABNORMAL LOW (ref >90)
GFR, EST NON AFRICAN AMERICAN: 73 mL/min — AB (ref >90)
Glucose, Bld: 98 mg/dL (ref 70–99)
POTASSIUM: 3.7 meq/L (ref 3.7–5.3)
Sodium: 141 mEq/L (ref 137–147)

## 2014-03-12 MED ORDER — CARVEDILOL 12.5 MG PO TABS: 12.5000 mg | ORAL_TABLET | Freq: Two times a day (BID) | ORAL | Status: DC

## 2014-03-12 MED ORDER — POTASSIUM CHLORIDE CRYS ER 20 MEQ PO TBCR: 10.0000 meq | EXTENDED_RELEASE_TABLET | Freq: Every day | ORAL | Status: DC

## 2014-03-12 MED ORDER — METOLAZONE 2.5 MG PO TABS: 2.5000 mg | ORAL_TABLET | ORAL | Status: DC | PRN

## 2014-03-12 NOTE — Patient Instructions (Signed)
Start Metolazone 2.5 mg every Wednesday   Labs today  Your physician recommends that you schedule a follow-up appointment in: 4 months with ECHO  Do the following things EVERYDAY: 1) Weigh yourself in the morning before breakfast. Write it down and keep it in a log. 2) Take your medicines as prescribed 3) Eat low salt foods-Limit salt (sodium) to 2000 mg per day.  4) Stay as active as you can everyday 5) Limit all fluids for the day to less than 2 liters 6)

## 2014-03-12 NOTE — Progress Notes (Signed)
Patient ID: [name], female   DOB: [birth year], 58 y.o.   MRN: [id number] PCP: Dr Raeanne Gathers  HPI: [name] is a 58 y/o woman with history of breast CA in 1998 treated with Adriamycin x 4 cycles. In 2007, diagnosed with CHF with EF 25-35%. She is s/p Medtronic BiV-ICD in 2008 with Sprint [employer] lead. LV function has subsequently recovered  Had cath 12/10. Coronaries normal. Right atrial pressure mean of 7, RV pressure 28/6 . Pulmonary artery pressure was 23/9 with mean of 15.  Pulmonary capillary wedge pressure mean of 9. Fick cardiac output 3.9 L per minute.  Cardiac index 2.2 L per minute per meter squared.  Pulmonary vascular resistance was 1.5 Woods units.  Echo 5/11: EF 50-55%  12/15/11 ECHO EF 50%. RV normal. 12/16/11 ICD generator replaced due to EOL 04/2013 EF 50%  Follow up: Overall doing fairly well. Doing a bit of walking (got a FitBit) but not exercising per se. Weight has been trending up. Has gained 3 pounds since March and 13 pounds since last July. About every week or two has to take metolazone for fluid overload - takes extra potassium with it. Continues with exertional dyspnea with moderate activity.   Optivol: ICD was checked today. Multiple increases in fluid level but only one crossed threshold. Activity level 4-6 hours per day. No AF/VT  ROS: All systems negative except as listed in HPI, PMH and Problem List.  Past Medical History  Diagnosis Date  . CHF (congestive heart failure)     due to non-ischemic CM (possible anthracycline toxicity) - dx'd 2007 (EF 25%); cath 12/10. Coronaries normal. LVEF 50%; Right atrial pressure mean of 7, RV pressure 28/6 . Pulmonary artery pressure was 23/9 with mean of  15.  Pulmonary capillary wedge pressure mean of 9. Fick cardiac output 3.9 L per minute.  Cardiac index  2.2 L per minute per meter squared.  Pulmonary vascular resistance was 1.5  . Breast cancer     s/p mastectomy and chemo with adriamycin (1998)  . Obesity   . NSVT  (nonsustained ventricular tachycardia)   . Biventricular implantable cardiac defibrillator)     Medtronic  . Nonischemic cardiomyopathy     EF 55% echo 2012  . sprint Fidelis     Current Outpatient Prescriptions  Medication Sig Dispense Refill  . allopurinol (ZYLOPRIM) 300 MG tablet Take 300 mg by mouth daily.        Marland Kitchen b complex vitamins tablet Take 1 tablet by mouth daily.      . carvedilol (COREG) 12.5 MG tablet Take 1 tablet (12.5 mg total) by mouth 2 (two) times daily with a meal.  180 tablet  0  . citalopram (CELEXA) 40 MG tablet Take 40 mg by mouth daily.        . furosemide (LASIX) 80 MG tablet Take 1 tablet (80 mg total) by mouth 2 (two) times daily.  180 tablet  1  . losartan (COZAAR) 50 MG tablet Take 1 tablet (50 mg total) by mouth daily.  90 tablet  3  . metolazone (ZAROXOLYN) 2.5 MG tablet Take 1 tablet (2.5 mg total) by mouth as needed. For weight gain of 3 lbs overnight or in 1 week  5 tablet  3  . multivitamin-iron-minerals-folic acid (THERAPEUTIC-M) TABS tablet Take 1 tablet by mouth daily.      Marland Kitchen omeprazole (PRILOSEC) 20 MG capsule Take 20 mg by mouth as needed.       . potassium chloride SA (K-DUR,KLOR-CON) 20  MEQ tablet Take 0.5 tablets (10 mEq total) by mouth daily.  45 tablet  1  . simvastatin (ZOCOR) 40 MG tablet Take 1 tablet (40 mg total) by mouth every evening.  90 tablet  6  . spironolactone (ALDACTONE) 25 MG tablet Take 1 tablet (25 mg total) by mouth daily.  90 tablet  3  . zolpidem (AMBIEN) 10 MG tablet Take 5 mg by mouth at bedtime as needed. For sleep       No current facility-administered medications for this encounter.   PHYSICAL EXAM: Filed Vitals:   03/12/14 1356  BP: 94/56  Pulse: 81  Weight: 206 lb (93.441 kg)  SpO2: 98%   General:  Well appearing. No resp difficulty HEENT: normal Neck: supple. JVP flat 6-7 Carotids 2+ bilaterally; no bruits. No lymphadenopathy or thryomegaly appreciated. Cor: PMI normal. Regular rate & rhythm. No rubs,  gallops or murmurs.  Lungs: clear Abdomen: soft, nontender, nondistended. No hepatosplenomegaly. No bruits or masses. Good bowel sounds. Extremities: no cyanosis, clubbing, rash, trace edema Neuro: alert & orientedx3, cranial nerves grossly intact. Moves all 4 extremities w/o difficulty. Affect pleasant.  ASSESSMENT & PLAN:  1) Chronic diastolic HF: EF A999333 on last echo, improved post CRT.  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 - ICD/Optivol interrogated in Clinic and reviewed with her. Volume status up and down but rarely crosses threshold. Suspect variability mostly due to dietary issues. She is doing a good job with sliding scale metolazone to avoid problems.  - Continue Lasix 80 mg po bid, will check BMET/BNP today.  - May use metolazone qwk prn 3 lb weight gain.   - Continue current Coreg, losartan, and spironolactone.  Should have BMET followed every 3-4 months with spironolactone. - Will see back in 4 months with repeat echo.   2) Medtronic BiV ICD: Has regular EP followup.   Quillian Quince BensimhonMD 03/12/2014 2:29 PM

## 2014-03-13 ENCOUNTER — Telehealth (HOSPITAL_COMMUNITY): Payer: Medicare PPO

## 2014-03-16 ENCOUNTER — Other Ambulatory Visit (HOSPITAL_COMMUNITY): Payer: Self-pay

## 2014-03-16 MED ORDER — METOLAZONE 2.5 MG PO TABS: 2.5000 mg | ORAL_TABLET | ORAL | Status: DC | PRN

## 2014-03-21 ENCOUNTER — Encounter: Payer: Self-pay | Admitting: Internal Medicine

## 2014-03-28 ENCOUNTER — Ambulatory Visit (HOSPITAL_COMMUNITY): Payer: Medicare PPO | Attending: Cardiovascular Disease

## 2014-03-28 ENCOUNTER — Encounter: Payer: Self-pay | Admitting: Internal Medicine

## 2014-03-28 ENCOUNTER — Ambulatory Visit (INDEPENDENT_AMBULATORY_CARE_PROVIDER_SITE_OTHER): Payer: Medicare PPO | Admitting: Internal Medicine

## 2014-03-28 VITALS — BP 114/66 | HR 80 | Ht 61.0 in | Wt 204.0 lb

## 2014-03-28 DIAGNOSIS — R0989 Other specified symptoms and signs involving the circulatory and respiratory systems: Secondary | ICD-10-CM | POA: Insufficient documentation

## 2014-03-28 DIAGNOSIS — R0609 Other forms of dyspnea: Secondary | ICD-10-CM | POA: Insufficient documentation

## 2014-03-28 DIAGNOSIS — I059 Rheumatic mitral valve disease, unspecified: Secondary | ICD-10-CM | POA: Insufficient documentation

## 2014-03-28 DIAGNOSIS — I4729 Other ventricular tachycardia: Secondary | ICD-10-CM

## 2014-03-28 DIAGNOSIS — I959 Hypotension, unspecified: Secondary | ICD-10-CM | POA: Insufficient documentation

## 2014-03-28 DIAGNOSIS — I472 Ventricular tachycardia: Secondary | ICD-10-CM

## 2014-03-28 DIAGNOSIS — I498 Other specified cardiac arrhythmias: Secondary | ICD-10-CM | POA: Insufficient documentation

## 2014-03-28 DIAGNOSIS — R5383 Other fatigue: Secondary | ICD-10-CM

## 2014-03-28 DIAGNOSIS — I509 Heart failure, unspecified: Secondary | ICD-10-CM | POA: Insufficient documentation

## 2014-03-28 DIAGNOSIS — I379 Nonrheumatic pulmonary valve disorder, unspecified: Secondary | ICD-10-CM | POA: Insufficient documentation

## 2014-03-28 DIAGNOSIS — I5022 Chronic systolic (congestive) heart failure: Secondary | ICD-10-CM

## 2014-03-28 DIAGNOSIS — E669 Obesity, unspecified: Secondary | ICD-10-CM | POA: Insufficient documentation

## 2014-03-28 DIAGNOSIS — Z95810 Presence of automatic (implantable) cardiac defibrillator: Secondary | ICD-10-CM

## 2014-03-28 DIAGNOSIS — I079 Rheumatic tricuspid valve disease, unspecified: Secondary | ICD-10-CM | POA: Insufficient documentation

## 2014-03-28 DIAGNOSIS — R5381 Other malaise: Secondary | ICD-10-CM | POA: Insufficient documentation

## 2014-03-28 DIAGNOSIS — I428 Other cardiomyopathies: Secondary | ICD-10-CM

## 2014-03-28 DIAGNOSIS — I422 Other hypertrophic cardiomyopathy: Secondary | ICD-10-CM | POA: Insufficient documentation

## 2014-03-28 DIAGNOSIS — C50919 Malignant neoplasm of unspecified site of unspecified female breast: Secondary | ICD-10-CM | POA: Insufficient documentation

## 2014-03-28 DIAGNOSIS — I517 Cardiomegaly: Secondary | ICD-10-CM | POA: Insufficient documentation

## 2014-03-28 LAB — MDC_IDC_ENUM_SESS_TYPE_INCLINIC
Battery Voltage: 3.08 V
Brady Statistic AP VP Percent: 8.35 %
Brady Statistic AS VP Percent: 91.54 %
Date Time Interrogation Session: 20150722152404
HIGH POWER IMPEDANCE MEASURED VALUE: 304 Ohm
HIGH POWER IMPEDANCE MEASURED VALUE: 62 Ohm
HighPow Impedance: 456 Ohm
HighPow Impedance: 78 Ohm
Lead Channel Impedance Value: 608 Ohm
Lead Channel Impedance Value: 665 Ohm
Lead Channel Pacing Threshold Amplitude: 0.625 V
Lead Channel Pacing Threshold Amplitude: 0.75 V
Lead Channel Pacing Threshold Amplitude: 1.375 V
Lead Channel Pacing Threshold Pulse Width: 0.4 ms
Lead Channel Pacing Threshold Pulse Width: 0.4 ms
Lead Channel Sensing Intrinsic Amplitude: 14.75 mV
Lead Channel Sensing Intrinsic Amplitude: 15.25 mV
Lead Channel Setting Pacing Amplitude: 1.5 V
Lead Channel Setting Pacing Amplitude: 2.5 V
Lead Channel Setting Pacing Pulse Width: 0.4 ms
Lead Channel Setting Pacing Pulse Width: 0.6 ms
Lead Channel Setting Sensing Sensitivity: 0.3 mV
MDC IDC MSMT LEADCHNL LV IMPEDANCE VALUE: 247 Ohm
MDC IDC MSMT LEADCHNL LV IMPEDANCE VALUE: 817 Ohm
MDC IDC MSMT LEADCHNL RA IMPEDANCE VALUE: 361 Ohm
MDC IDC MSMT LEADCHNL RA PACING THRESHOLD PULSEWIDTH: 0.4 ms
MDC IDC MSMT LEADCHNL RA SENSING INTR AMPL: 2.875 mV
MDC IDC MSMT LEADCHNL RA SENSING INTR AMPL: 5.125 mV
MDC IDC SET LEADCHNL RV PACING AMPLITUDE: 2.5 V
MDC IDC SET ZONE DETECTION INTERVAL: 300 ms
MDC IDC SET ZONE DETECTION INTERVAL: 450 ms
MDC IDC STAT BRADY AP VS PERCENT: 0.01 %
MDC IDC STAT BRADY AS VS PERCENT: 0.1 %
MDC IDC STAT BRADY RA PERCENT PACED: 8.36 %
MDC IDC STAT BRADY RV PERCENT PACED: 99.89 %
Zone Setting Detection Interval: 340 ms
Zone Setting Detection Interval: 350 ms

## 2014-03-28 NOTE — Progress Notes (Signed)
2D Echo completed. 03/28/2014 

## 2014-03-28 NOTE — Patient Instructions (Addendum)
Your physician recommends that you continue on your current medications as directed. Please refer to the Current Medication list given to you today.  Remote monitoring is used to monitor your  ICD from home. This monitoring reduces the number of office visits required to check your device to one time per year. It allows [place] to keep an eye on the functioning of your device to ensure it is working properly. You are scheduled for a device check from home on 07-02-2014. You may send your transmission at any time that day. If you have a wireless device, the transmission will be sent automatically. After your physician reviews your transmission, you will receive a postcard with your next transmission date.  Your physician recommends that you schedule a follow-up appointment in: 12 months with Dr.Klein

## 2014-03-28 NOTE — Progress Notes (Signed)
Patient Care Team: Natasha Brilliant, MD as PCP - General   HPI  [name] is a 58 y.o. female seen today in follow-up for congestive heart failure in the setting of nonischemic cardiomyopathy. She is status post CRTD implantation about 4 years ago.She has a sprint Fidelis lead she reached ERI in Aprill 2013  We reversed the rate sense portion of the RV and LV leads   The patient denies chest pain, nocturnal dyspnea, orthopnea.    There've been more problems with shortness of breath of late. There is some edema in the past and she was started on metolazone by the heart clinic.     She underwent catheterization 12/10 , coronaries were normal and filling pressures were normal  Nuke study in 2007 normal perfusion by report. Echo 5/12 EF remains in the 55-60% range  Medical history notable for breast CA in 1998 treated with Adriamycin x 4 cycles.       Past Medical History  Diagnosis Date  . CHF (congestive heart failure)     due to non-ischemic CM (possible anthracycline toxicity) - dx'd 2007 (EF 25%); cath 12/10. Coronaries normal. LVEF 50%; Right atrial pressure mean of 7, RV pressure 28/6 . Pulmonary artery pressure was 23/9 with mean of  15.  Pulmonary capillary wedge pressure mean of 9. Fick cardiac output 3.9 L per minute.  Cardiac index  2.2 L per minute per meter squared.  Pulmonary vascular resistance was 1.5  . Breast cancer     s/p mastectomy and chemo with adriamycin (1998)  . Obesity   . NSVT (nonsustained ventricular tachycardia)   . Biventricular implantable cardiac defibrillator)     Medtronic  . Nonischemic cardiomyopathy     EF 55% echo 2012  . sprint Fidelis     Past Surgical History  Procedure Laterality Date  . Biv icd      Medtronic    Current Outpatient Prescriptions  Medication Sig Dispense Refill  . allopurinol (ZYLOPRIM) 300 MG tablet Take 300 mg by mouth daily.        Marland Kitchen b complex vitamins tablet Take 1 tablet by mouth daily.      .  carvedilol (COREG) 12.5 MG tablet Take 1 tablet (12.5 mg total) by mouth 2 (two) times daily with a meal.  180 tablet  3  . citalopram (CELEXA) 40 MG tablet Take 40 mg by mouth daily.        . furosemide (LASIX) 80 MG tablet Take 1 tablet (80 mg total) by mouth 2 (two) times daily.  180 tablet  1  . losartan (COZAAR) 50 MG tablet Take 1 tablet (50 mg total) by mouth daily.  90 tablet  3  . metolazone (ZAROXOLYN) 2.5 MG tablet Take 1 tablet (2.5 mg total) by mouth as needed. For weight gain of 3 lbs overnight or in 1 week  30 tablet  3  . multivitamin-iron-minerals-folic acid (THERAPEUTIC-M) TABS tablet Take 1 tablet by mouth daily.      Marland Kitchen omeprazole (PRILOSEC) 20 MG capsule Take 20 mg by mouth as needed.       . potassium chloride SA (K-DUR,KLOR-CON) 20 MEQ tablet Take 0.5 tablets (10 mEq total) by mouth daily.  45 tablet  3  . simvastatin (ZOCOR) 40 MG tablet Take 1 tablet (40 mg total) by mouth every evening.  90 tablet  6  . spironolactone (ALDACTONE) 25 MG tablet Take 1 tablet (25 mg total) by mouth daily.  90 tablet  3  .  zolpidem (AMBIEN) 10 MG tablet Take 5 mg by mouth at bedtime as needed. For sleep       No current facility-administered medications for this visit.    No Known Allergies  Review of Systems negative except from HPI and PMH  Physical Exam BP 114/66  Pulse 80  Ht 5\' 1"  (1.549 m)  Wt 204 lb (92.534 kg)  BMI 38.57 kg/m2 Well developed and nourished in no acute distress HENT normal Neck supple with JVP-flat Clear Regular rate and rhythm, no murmurs or gallops Abd-soft with active BS No Clubbing cyanosis edema Skin-warm and dry A & Oriented  Grossly normal sensory and motor function  ECG demonstrates P. Synchronous pacing with a biventricular configuration   Assessment and  Plan  Nonischemic cardiomyopathy  Congestive heart failure-chronic systolic grade 3  Implantable defibrillator-Medtronic The patient's device was interrogated and the information was  fully reviewed.  The device was reprogrammed to  decrease LV outputs has to preserve battery   and I will be in touch with the heart failure clinic to see whether AV optimization might be of benefit. We have done a previously with some improvement.

## 2014-03-30 ENCOUNTER — Other Ambulatory Visit (HOSPITAL_COMMUNITY): Payer: Self-pay

## 2014-03-30 ENCOUNTER — Other Ambulatory Visit (HOSPITAL_COMMUNITY): Payer: Self-pay | Admitting: Anesthesiology

## 2014-03-30 ENCOUNTER — Telehealth (HOSPITAL_COMMUNITY): Payer: Self-pay | Admitting: Vascular Surgery

## 2014-03-30 DIAGNOSIS — I5022 Chronic systolic (congestive) heart failure: Secondary | ICD-10-CM

## 2014-03-30 MED ORDER — CARVEDILOL 12.5 MG PO TABS: 12.5000 mg | ORAL_TABLET | Freq: Two times a day (BID) | ORAL | Status: DC

## 2014-03-30 NOTE — Telephone Encounter (Signed)
Clarified directions for metolazone through [employer].  Refilled.

## 2014-03-30 NOTE — Telephone Encounter (Signed)
Pt got a letter from [facility] notfill the Metolazone.... The medicine is not available the way it was prescribed, [facility] [phone]

## 2014-04-04 ENCOUNTER — Telehealth (HOSPITAL_COMMUNITY): Payer: Self-pay | Admitting: Vascular Surgery

## 2014-04-04 NOTE — Telephone Encounter (Signed)
Refilled Metolazone 2.5 mg.. [facility] will not fill medication the way the it was prescribed ... Please advise

## 2014-04-05 ENCOUNTER — Other Ambulatory Visit: Payer: Self-pay | Admitting: *Deleted

## 2014-04-05 DIAGNOSIS — I428 Other cardiomyopathies: Secondary | ICD-10-CM

## 2014-04-05 DIAGNOSIS — I5022 Chronic systolic (congestive) heart failure: Secondary | ICD-10-CM

## 2014-04-05 NOTE — Telephone Encounter (Signed)
Spoke with representative from pharmacy Confirmed meds were mailed to pts home 04/04/14 HOWEVER #90 sig: one po daily Assuming this went through for insurance purposes   Spoke with pt and advised her the only take meds as prescribed- we have her taking one weekly (wednesday) Advised meds will come with label reading take one daily Again advised to only take as ordered by CHF clinic Pt voiced understanding

## 2014-04-17 ENCOUNTER — Ambulatory Visit (HOSPITAL_COMMUNITY)
Admission: RE | Admit: 2014-04-17 | Discharge: 2014-04-17 | Disposition: A | Discharge status: Home / Self Care | Payer: Medicare PPO | Source: Ambulatory Visit | Attending: Internal Medicine | Admitting: Internal Medicine

## 2014-04-17 DIAGNOSIS — I5022 Chronic systolic (congestive) heart failure: Secondary | ICD-10-CM

## 2014-04-18 ENCOUNTER — Ambulatory Visit (INDEPENDENT_AMBULATORY_CARE_PROVIDER_SITE_OTHER): Payer: Medicare PPO | Admitting: *Deleted

## 2014-04-18 ENCOUNTER — Encounter (HOSPITAL_COMMUNITY): Payer: Self-pay | Admitting: *Deleted

## 2014-04-18 ENCOUNTER — Ambulatory Visit (HOSPITAL_COMMUNITY): Payer: Medicare PPO | Attending: Family Medicine | Admitting: Cardiology

## 2014-04-18 DIAGNOSIS — I5022 Chronic systolic (congestive) heart failure: Secondary | ICD-10-CM

## 2014-04-18 DIAGNOSIS — I359 Nonrheumatic aortic valve disorder, unspecified: Secondary | ICD-10-CM | POA: Insufficient documentation

## 2014-04-18 DIAGNOSIS — I059 Rheumatic mitral valve disease, unspecified: Secondary | ICD-10-CM | POA: Insufficient documentation

## 2014-04-18 DIAGNOSIS — I509 Heart failure, unspecified: Secondary | ICD-10-CM | POA: Diagnosis not present

## 2014-04-18 DIAGNOSIS — I428 Other cardiomyopathies: Secondary | ICD-10-CM

## 2014-04-18 DIAGNOSIS — Z95810 Presence of automatic (implantable) cardiac defibrillator: Secondary | ICD-10-CM | POA: Diagnosis not present

## 2014-04-18 DIAGNOSIS — I2589 Other forms of chronic ischemic heart disease: Secondary | ICD-10-CM

## 2014-04-18 DIAGNOSIS — R9389 Abnormal findings on diagnostic imaging of other specified body structures: Secondary | ICD-10-CM | POA: Insufficient documentation

## 2014-04-18 DIAGNOSIS — I4729 Other ventricular tachycardia: Secondary | ICD-10-CM

## 2014-04-18 DIAGNOSIS — I472 Ventricular tachycardia: Secondary | ICD-10-CM

## 2014-04-18 LAB — MDC_IDC_ENUM_SESS_TYPE_INCLINIC
Battery Voltage: 3.07 V
Brady Statistic AP VS Percent: 7.1 %
Brady Statistic AS VP Percent: 0.1 % — CL
Lead Channel Impedance Value: 399 Ohm
Lead Channel Impedance Value: 608 Ohm
Lead Channel Impedance Value: 760 Ohm
Lead Channel Pacing Threshold Amplitude: 0.5 V
Lead Channel Pacing Threshold Amplitude: 1 V
Lead Channel Pacing Threshold Pulse Width: 0.4 ms
Lead Channel Pacing Threshold Pulse Width: 0.4 ms
Lead Channel Sensing Intrinsic Amplitude: 4.4 mV
Lead Channel Setting Pacing Pulse Width: 0.4 ms
Lead Channel Setting Sensing Sensitivity: 0.3 mV
MDC IDC MSMT LEADCHNL LV PACING THRESHOLD AMPLITUDE: 1 V
MDC IDC MSMT LEADCHNL RV PACING THRESHOLD PULSEWIDTH: 0.4 ms
MDC IDC MSMT LEADCHNL RV SENSING INTR AMPL: 16.9 mV
MDC IDC SET LEADCHNL LV PACING AMPLITUDE: 2.5 V
MDC IDC SET LEADCHNL RA PACING AMPLITUDE: 1.75 V
MDC IDC SET LEADCHNL RV PACING AMPLITUDE: 3.25 V
MDC IDC SET LEADCHNL RV PACING PULSEWIDTH: 0.4 ms
MDC IDC SET ZONE DETECTION INTERVAL: 340 ms
MDC IDC SET ZONE DETECTION INTERVAL: 350 ms
MDC IDC SET ZONE DETECTION INTERVAL: 450 ms
MDC IDC STAT BRADY AS VS PERCENT: 92.9 %
Zone Setting Detection Interval: 300 ms

## 2014-04-18 NOTE — Progress Notes (Signed)
AV opt echo performed. Dr Rayann Heman present.

## 2014-04-18 NOTE — Progress Notes (Signed)
Heart Failure Diagnostics Follow Up Form - Medtronic    Fluid Index        _X__   Fluid index trending with baseline       ___   Fluid index trending up but below threshold       ___   Fluid index above threshold       ___   More than 3 threshold crossings this year        Current diuretic dose: Marland Kitchen                                                                                                  Baseline weight: .                                      Current Weight: .                                     Patient Activity        Current activity level: .      5 hours daily                                                    Baseline activity level: .     4-6 hours daily                                                     AT/AF Burden        ___  Increase in AT/AF burden       ___  New onset of AT/AF       _X__  No AT/AF         ICD Therapies        ___ 1 or more ICD shocks    Interventions       ___  Adjust medications: .                                                                                                   ___  Labs: .  ___  Need for follow up appointment: .                                                                                _X__  HF diagnostics follow up: .     05/21/14                                                                                     ___  Review with EP: Marland Kitchen                                                                                                         Patient notified: Date: .   04/18/14                             By: .       Phone   .  X     Letter

## 2014-04-26 NOTE — Progress Notes (Signed)
Echo AV opt performed per [employer]. SAV changed from 113ms to 166ms. No other changes made this session. No episodes recorded. Patient will follow up with [facility] as scheduled.

## 2014-05-02 ENCOUNTER — Encounter: Payer: Self-pay | Admitting: Internal Medicine

## 2014-05-16 ENCOUNTER — Other Ambulatory Visit: Payer: Self-pay

## 2014-05-16 ENCOUNTER — Other Ambulatory Visit: Payer: Self-pay | Admitting: *Deleted

## 2014-05-16 MED ORDER — FUROSEMIDE 80 MG PO TABS: 80.0000 mg | ORAL_TABLET | Freq: Two times a day (BID) | ORAL | Status: DC

## 2014-05-22 ENCOUNTER — Ambulatory Visit (HOSPITAL_COMMUNITY)
Admission: RE | Admit: 2014-05-22 | Discharge: 2014-05-22 | Disposition: A | Discharge status: Home / Self Care | Payer: Medicare PPO | Source: Ambulatory Visit | Attending: Cardiology | Admitting: Cardiology

## 2014-05-22 ENCOUNTER — Encounter (HOSPITAL_COMMUNITY): Payer: Self-pay | Admitting: *Deleted

## 2014-05-22 DIAGNOSIS — I5022 Chronic systolic (congestive) heart failure: Secondary | ICD-10-CM

## 2014-05-22 NOTE — Progress Notes (Signed)
Heart Failure Diagnostics Follow Up Form - Medtronic    Fluid Index        _X__   Fluid index trending with baseline       ___   Fluid index trending up but below threshold       ___   Fluid index above threshold       ___   More than 3 threshold crossings this year        Current diuretic dose: Marland Kitchen                                                                                                  Baseline weight: .                                      Current Weight: .                                     Patient Activity        Current activity level: .      5 hours daily                                                    Baseline activity level: .       4-6 hours daily                                                   AT/AF Burden        ___  Increase in AT/AF burden       ___  New onset of AT/AF       _X__  No AT/AF         ICD Therapies        ___ 1 or more ICD shocks    Interventions       ___  Adjust medications: .                                                                                                   ___  Labs: .  ___  Need for follow up appointment: .                                                                                _X__  HF diagnostics follow up: .    06/25/14                                                                                      ___  Review with EP: Marland Kitchen                                                                                                         Patient notified: Date: .    05/22/14                            By: .       Phone   .   X    Letter

## 2014-05-31 ENCOUNTER — Encounter: Payer: Self-pay | Admitting: Internal Medicine

## 2014-06-22 ENCOUNTER — Other Ambulatory Visit: Payer: Self-pay

## 2014-07-02 ENCOUNTER — Ambulatory Visit (INDEPENDENT_AMBULATORY_CARE_PROVIDER_SITE_OTHER): Payer: Medicare PPO | Admitting: *Deleted

## 2014-07-02 ENCOUNTER — Telehealth: Payer: Self-pay | Admitting: Cardiology

## 2014-07-02 DIAGNOSIS — I428 Other cardiomyopathies: Secondary | ICD-10-CM

## 2014-07-02 DIAGNOSIS — I5022 Chronic systolic (congestive) heart failure: Secondary | ICD-10-CM

## 2014-07-02 DIAGNOSIS — I429 Cardiomyopathy, unspecified: Secondary | ICD-10-CM

## 2014-07-02 NOTE — Progress Notes (Signed)
Remote ICD transmission.   

## 2014-07-02 NOTE — Telephone Encounter (Signed)
Opened in error

## 2014-07-05 LAB — MDC_IDC_ENUM_SESS_TYPE_REMOTE
Battery Voltage: 3.04 V
Brady Statistic AP VP Percent: 8.03 %
Brady Statistic AS VP Percent: 91.91 %
Brady Statistic RA Percent Paced: 8.04 %
Brady Statistic RV Percent Paced: 99.94 %
Date Time Interrogation Session: 20151026112533
HighPow Impedance: 399 Ohm
HighPow Impedance: 52 Ohm
HighPow Impedance: 60 Ohm
Lead Channel Impedance Value: 228 Ohm
Lead Channel Impedance Value: 361 Ohm
Lead Channel Impedance Value: 589 Ohm
Lead Channel Pacing Threshold Amplitude: 0.75 V
Lead Channel Pacing Threshold Amplitude: 1.625 V
Lead Channel Pacing Threshold Pulse Width: 0.4 ms
Lead Channel Pacing Threshold Pulse Width: 0.4 ms
Lead Channel Pacing Threshold Pulse Width: 0.4 ms
Lead Channel Sensing Intrinsic Amplitude: 17.75 mV
Lead Channel Sensing Intrinsic Amplitude: 17.75 mV
Lead Channel Sensing Intrinsic Amplitude: 3.25 mV
Lead Channel Setting Sensing Sensitivity: 0.3 mV
MDC IDC MSMT LEADCHNL LV IMPEDANCE VALUE: 722 Ohm
MDC IDC MSMT LEADCHNL LV PACING THRESHOLD AMPLITUDE: 0.75 V
MDC IDC MSMT LEADCHNL RA SENSING INTR AMPL: 3.25 mV
MDC IDC MSMT LEADCHNL RV IMPEDANCE VALUE: 551 Ohm
MDC IDC SET LEADCHNL LV PACING AMPLITUDE: 2.5 V
MDC IDC SET LEADCHNL LV PACING PULSEWIDTH: 0.4 ms
MDC IDC SET LEADCHNL RA PACING AMPLITUDE: 1.5 V
MDC IDC SET LEADCHNL RV PACING AMPLITUDE: 3.5 V
MDC IDC SET LEADCHNL RV PACING PULSEWIDTH: 0.4 ms
MDC IDC SET ZONE DETECTION INTERVAL: 340 ms
MDC IDC SET ZONE DETECTION INTERVAL: 350 ms
MDC IDC STAT BRADY AP VS PERCENT: 0.01 %
MDC IDC STAT BRADY AS VS PERCENT: 0.05 %
Zone Setting Detection Interval: 300 ms
Zone Setting Detection Interval: 450 ms

## 2014-07-09 ENCOUNTER — Encounter: Payer: Self-pay | Admitting: *Deleted

## 2014-07-12 ENCOUNTER — Encounter: Payer: Self-pay | Admitting: Internal Medicine

## 2014-07-27 ENCOUNTER — Other Ambulatory Visit (HOSPITAL_COMMUNITY): Payer: Self-pay | Admitting: Cardiology

## 2014-07-27 DIAGNOSIS — I5022 Chronic systolic (congestive) heart failure: Secondary | ICD-10-CM

## 2014-08-01 ENCOUNTER — Encounter (HOSPITAL_COMMUNITY): Payer: Self-pay

## 2014-08-01 ENCOUNTER — Ambulatory Visit (HOSPITAL_COMMUNITY)
Admission: RE | Admit: 2014-08-01 | Discharge: 2014-08-01 | Disposition: A | Discharge status: Home / Self Care | Payer: Medicare PPO | Source: Ambulatory Visit | Attending: Internal Medicine | Admitting: Internal Medicine

## 2014-08-01 ENCOUNTER — Ambulatory Visit (HOSPITAL_BASED_OUTPATIENT_CLINIC_OR_DEPARTMENT_OTHER)
Admission: RE | Admit: 2014-08-01 | Discharge: 2014-08-01 | Disposition: A | Discharge status: Home / Self Care | Payer: Medicare PPO | Source: Ambulatory Visit | Attending: Cardiology | Admitting: Cardiology

## 2014-08-01 VITALS — BP 110/70 | HR 81 | Wt 206.4 lb

## 2014-08-01 DIAGNOSIS — I429 Cardiomyopathy, unspecified: Secondary | ICD-10-CM | POA: Insufficient documentation

## 2014-08-01 DIAGNOSIS — I959 Hypotension, unspecified: Secondary | ICD-10-CM | POA: Diagnosis not present

## 2014-08-01 DIAGNOSIS — R5383 Other fatigue: Secondary | ICD-10-CM

## 2014-08-01 DIAGNOSIS — I509 Heart failure, unspecified: Secondary | ICD-10-CM | POA: Diagnosis not present

## 2014-08-01 DIAGNOSIS — I313 Pericardial effusion (noninflammatory): Secondary | ICD-10-CM | POA: Insufficient documentation

## 2014-08-01 DIAGNOSIS — R0600 Dyspnea, unspecified: Secondary | ICD-10-CM | POA: Diagnosis not present

## 2014-08-01 DIAGNOSIS — I5022 Chronic systolic (congestive) heart failure: Secondary | ICD-10-CM

## 2014-08-01 DIAGNOSIS — E669 Obesity, unspecified: Secondary | ICD-10-CM | POA: Insufficient documentation

## 2014-08-01 DIAGNOSIS — I472 Ventricular tachycardia: Secondary | ICD-10-CM | POA: Diagnosis not present

## 2014-08-01 DIAGNOSIS — I340 Nonrheumatic mitral (valve) insufficiency: Secondary | ICD-10-CM | POA: Insufficient documentation

## 2014-08-01 DIAGNOSIS — Z95810 Presence of automatic (implantable) cardiac defibrillator: Secondary | ICD-10-CM

## 2014-08-01 DIAGNOSIS — I059 Rheumatic mitral valve disease, unspecified: Secondary | ICD-10-CM

## 2014-08-01 LAB — COMPREHENSIVE METABOLIC PANEL
ALK PHOS: 141 U/L — AB (ref 39–117)
ALT: 16 U/L (ref 0–35)
AST: 21 U/L (ref 0–37)
Albumin: 3.7 g/dL (ref 3.5–5.2)
Anion gap: 13 (ref 5–15)
BUN: 10 mg/dL (ref 6–23)
CHLORIDE: 98 meq/L (ref 96–112)
CO2: 28 meq/L (ref 19–32)
Calcium: 9.5 mg/dL (ref 8.4–10.5)
Creatinine, Ser: 0.84 mg/dL (ref 0.50–1.10)
GFR calc Af Amer: 86 mL/min — ABNORMAL LOW (ref >90)
GFR, EST NON AFRICAN AMERICAN: 74 mL/min — AB (ref >90)
Glucose, Bld: 99 mg/dL (ref 70–99)
Potassium: 3.7 mEq/L (ref 3.7–5.3)
Sodium: 139 mEq/L (ref 137–147)
Total Bilirubin: 0.5 mg/dL (ref 0.3–1.2)
Total Protein: 7.1 g/dL (ref 6.0–8.3)

## 2014-08-01 LAB — LIPID PANEL
CHOL/HDL RATIO: 3.1 ratio
Cholesterol: 133 mg/dL (ref 0–200)
HDL: 43 mg/dL (ref >39)
LDL CALC: 71 mg/dL (ref 0–99)
Triglycerides: 94 mg/dL (ref <150)
VLDL: 19 mg/dL (ref 0–40)

## 2014-08-01 LAB — CBC
HCT: 38.9 % (ref 36.0–46.0)
HEMOGLOBIN: 12.5 g/dL (ref 12.0–15.0)
MCH: 30.8 pg (ref 26.0–34.0)
MCHC: 32.1 g/dL (ref 30.0–36.0)
MCV: 95.8 fL (ref 78.0–100.0)
PLATELETS: 242 10*3/uL (ref 150–400)
RBC: 4.06 MIL/uL (ref 3.87–5.11)
RDW: 13.7 % (ref 11.5–15.5)
WBC: 8.6 10*3/uL (ref 4.0–10.5)

## 2014-08-01 LAB — PRO B NATRIURETIC PEPTIDE: PRO B NATRI PEPTIDE: 491.5 pg/mL — AB (ref 0–125)

## 2014-08-01 LAB — TSH: TSH: 2.12 u[IU]/mL (ref 0.350–4.500)

## 2014-08-01 NOTE — Progress Notes (Signed)
  Echocardiogram 2D Echocardiogram has been performed.  Donata Clay 08/01/2014, 9:36 AM

## 2014-08-01 NOTE — Progress Notes (Signed)
Patient ID: [name], female   DOB: 1955/09/19, 58 y.o.   MRN: [id number] PCP: Dr Raeanne Gathers  HPI: [name] is a 58 y/o woman with history of breast CA in 1998 treated with Adriamycin x 4 cycles. In 2007, diagnosed with CHF with EF 25-35%. She is s/p Medtronic BiV-ICD in 2008 with Sprint Fidelis lead. LV function has subsequently recovered  Had cath 12/10. Coronaries normal. Right atrial pressure mean of 7, RV pressure 28/6 . Pulmonary artery pressure was 23/9 with mean of 15.  Pulmonary capillary wedge pressure mean of 9. Fick cardiac output 3.9 L per minute.  Cardiac index 2.2 L per minute per meter squared.  Pulmonary vascular resistance was 1.5 [place] units.  Echo 5/11: EF 50-55%  12/15/11 ECHO EF 50%. RV normal. 12/16/11 ICD generator replaced due to EOL 04/2013 EF 50% 11/125/15 Echo EF 50% inferior HK   Follow up: Overall doing fairly well. Walking 2 miles per day. Tracking with FitBit. Weight stable. BP ok. Continues with exertional dyspnea with moderate activity.  Takes metolazone every Wednesday. Saw Dr. Caryl Comes in July and her device was reprogrammed to decrease LV outputs has to preserve battery   Optivol: ICD was checked today. Multiple increases in fluid level but none crossed threshold. Activity level 4-6 hours per day. No AF/VT. Minimal pacing  ROS: All systems negative except as listed in HPI, PMH and Problem List.  Past Medical History  Diagnosis Date  . CHF (congestive heart failure)     due to non-ischemic CM (possible anthracycline toxicity) - dx'd 2007 (EF 25%); cath 12/10. Coronaries normal. LVEF 50%; Right atrial pressure mean of 7, RV pressure 28/6 . Pulmonary artery pressure was 23/9 with mean of  15.  Pulmonary capillary wedge pressure mean of 9. Fick cardiac output 3.9 L per minute.  Cardiac index  2.2 L per minute per meter squared.  Pulmonary vascular resistance was 1.5  . Breast cancer     s/p mastectomy and chemo with adriamycin (1998)  . Obesity   . NSVT  (nonsustained ventricular tachycardia)   . Biventricular implantable cardiac defibrillator)     Medtronic  . Nonischemic cardiomyopathy     EF 55% echo 2012  . sprint Fidelis     Current Outpatient Prescriptions  Medication Sig Dispense Refill  . allopurinol (ZYLOPRIM) 300 MG tablet Take 300 mg by mouth daily.      Marland Kitchen b complex vitamins tablet Take 1 tablet by mouth daily.    . carvedilol (COREG) 12.5 MG tablet Take 1 tablet (12.5 mg total) by mouth 2 (two) times daily with a meal. 180 tablet 3  . citalopram (CELEXA) 40 MG tablet Take 40 mg by mouth daily.      . furosemide (LASIX) 80 MG tablet Take 1 tablet (80 mg total) by mouth 2 (two) times daily. 180 tablet 0  . losartan (COZAAR) 50 MG tablet Take 1 tablet (50 mg total) by mouth daily. 90 tablet 3  . metolazone (ZAROXOLYN) 2.5 MG tablet Take 1 tablet (2.5 mg total) by mouth as needed. For weight gain of 3 lbs overnight or in 1 week (Patient taking differently: Take 2.5 mg by mouth every Wednesday. ) 30 tablet 3  . multivitamin-iron-minerals-folic acid (THERAPEUTIC-M) TABS tablet Take 1 tablet by mouth daily.    Marland Kitchen omeprazole (PRILOSEC) 20 MG capsule Take 20 mg by mouth as needed.     . potassium chloride SA (K-DUR,KLOR-CON) 20 MEQ tablet Take 0.5 tablets (10 mEq total) by mouth daily. [place]  tablet 3  . simvastatin (ZOCOR) 40 MG tablet Take 1 tablet (40 mg total) by mouth every evening. 90 tablet 6  . spironolactone (ALDACTONE) 25 MG tablet Take 1 tablet (25 mg total) by mouth daily. 90 tablet 3  . zolpidem (AMBIEN) 10 MG tablet Take 5 mg by mouth at bedtime as needed. For sleep     No current facility-administered medications for this encounter.   PHYSICAL EXAM: Filed Vitals:   08/01/14 1002  BP: 110/70  Pulse: 81  Weight: 206 lb 6.4 oz (93.622 kg)  SpO2: 97%   General:  Well appearing. No resp difficulty HEENT: normal Neck: supple. JVP flat 5-6 Carotids 2+ bilaterally; no bruits. No lymphadenopathy or thryomegaly  appreciated. Cor: PMI normal. Regular rate & rhythm. No rubs, gallops or murmurs.  Lungs: clear Abdomen: soft, nontender, nondistended. No hepatosplenomegaly. No bruits or masses. Good bowel sounds. Extremities: no cyanosis, clubbing, rash, no edema Neuro: alert & orientedx3, cranial nerves grossly intact. Moves all 4 extremities w/o difficulty. Affect pleasant.  ASSESSMENT & PLAN:  1) Chronic diastolic HF: EF [id number] again on echo today which i reviewed personally.  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 - Doing very well with stable NYHA II. Volume status good on exam and bi Optivol. I think metolazone regimen is working well for her. Check labs today.  - ICD/Optivol interrogated in Clinic and reviewed with her. Minimal BiV pacing after LV lead reprogrammed. I have asked for Dr. Olin Pia input on this today.   - Continue current Coreg, losartan, and spironolactone.   - Will see back in 4-6 months 2) Medtronic BiV ICD: As above. Await Dr. Olin Pia input.   Quillian Quince BensimhonMD 08/01/2014 10:44 AM

## 2014-08-01 NOTE — Addendum Note (Signed)
Encounter addended by: Scarlette Calico, RN on: 08/01/2014 10:58 AM<BR>     Documentation filed: Visit Diagnoses, Dx Association, Patient Instructions Section, Orders

## 2014-08-01 NOTE — Patient Instructions (Signed)
Labs today  We will contact you in 4 months to schedule your next appointment.  

## 2014-08-16 ENCOUNTER — Encounter (HOSPITAL_COMMUNITY): Payer: Self-pay | Admitting: Internal Medicine

## 2014-08-21 ENCOUNTER — Encounter: Payer: Self-pay | Admitting: Internal Medicine

## 2014-09-04 ENCOUNTER — Other Ambulatory Visit (HOSPITAL_COMMUNITY): Payer: Self-pay | Admitting: Internal Medicine

## 2014-09-04 DIAGNOSIS — I5022 Chronic systolic (congestive) heart failure: Secondary | ICD-10-CM

## 2014-09-13 ENCOUNTER — Encounter: Payer: Self-pay | Admitting: Cardiology

## 2014-09-19 ENCOUNTER — Encounter: Payer: Self-pay | Admitting: Internal Medicine

## 2014-10-02 ENCOUNTER — Ambulatory Visit (INDEPENDENT_AMBULATORY_CARE_PROVIDER_SITE_OTHER): Payer: Medicare PPO | Admitting: Internal Medicine

## 2014-10-02 ENCOUNTER — Telehealth: Payer: Self-pay | Admitting: Cardiology

## 2014-10-02 ENCOUNTER — Telehealth: Payer: Self-pay | Admitting: *Deleted

## 2014-10-02 ENCOUNTER — Encounter: Payer: Self-pay | Admitting: Internal Medicine

## 2014-10-02 VITALS — BP 100/80 | HR 78 | Ht 61.0 in | Wt 207.2 lb

## 2014-10-02 DIAGNOSIS — I429 Cardiomyopathy, unspecified: Secondary | ICD-10-CM

## 2014-10-02 DIAGNOSIS — Z4502 Encounter for adjustment and management of automatic implantable cardiac defibrillator: Secondary | ICD-10-CM

## 2014-10-02 DIAGNOSIS — I5022 Chronic systolic (congestive) heart failure: Secondary | ICD-10-CM

## 2014-10-02 DIAGNOSIS — I428 Other cardiomyopathies: Secondary | ICD-10-CM

## 2014-10-02 DIAGNOSIS — Z95810 Presence of automatic (implantable) cardiac defibrillator: Secondary | ICD-10-CM

## 2014-10-02 LAB — MDC_IDC_ENUM_SESS_TYPE_INCLINIC
Battery Voltage: 3.04 V
Brady Statistic AP VP Percent: 7.88 %
Brady Statistic AS VP Percent: 92.04 %
Brady Statistic AS VS Percent: 0.06 %
Brady Statistic RA Percent Paced: 7.89 %
Brady Statistic RV Percent Paced: 99.93 %
HIGH POWER IMPEDANCE MEASURED VALUE: 304 Ohm
HighPow Impedance: 418 Ohm
HighPow Impedance: 67 Ohm
HighPow Impedance: 80 Ohm
Lead Channel Impedance Value: 361 Ohm
Lead Channel Impedance Value: 779 Ohm
Lead Channel Pacing Threshold Amplitude: 0.75 V
Lead Channel Pacing Threshold Pulse Width: 0.4 ms
Lead Channel Sensing Intrinsic Amplitude: 16.875 mV
Lead Channel Sensing Intrinsic Amplitude: 3.125 mV
Lead Channel Setting Pacing Amplitude: 1.5 V
Lead Channel Setting Pacing Amplitude: 3.5 V
Lead Channel Setting Pacing Pulse Width: 0.4 ms
Lead Channel Setting Sensing Sensitivity: 0.3 mV
MDC IDC MSMT LEADCHNL LV IMPEDANCE VALUE: 247 Ohm
MDC IDC MSMT LEADCHNL LV IMPEDANCE VALUE: 665 Ohm
MDC IDC MSMT LEADCHNL LV PACING THRESHOLD AMPLITUDE: 0.75 V
MDC IDC MSMT LEADCHNL LV PACING THRESHOLD PULSEWIDTH: 0.4 ms
MDC IDC MSMT LEADCHNL RA SENSING INTR AMPL: 3.125 mV
MDC IDC MSMT LEADCHNL RV IMPEDANCE VALUE: 589 Ohm
MDC IDC MSMT LEADCHNL RV PACING THRESHOLD AMPLITUDE: 1.75 V
MDC IDC MSMT LEADCHNL RV PACING THRESHOLD PULSEWIDTH: 0.4 ms
MDC IDC MSMT LEADCHNL RV SENSING INTR AMPL: 16.875 mV
MDC IDC SESS DTM: 20160126143612
MDC IDC SET LEADCHNL LV PACING AMPLITUDE: 2.5 V
MDC IDC SET LEADCHNL LV PACING PULSEWIDTH: 0.4 ms
MDC IDC SET ZONE DETECTION INTERVAL: 340 ms
MDC IDC STAT BRADY AP VS PERCENT: 0.01 %
Zone Setting Detection Interval: 300 ms
Zone Setting Detection Interval: 350 ms
Zone Setting Detection Interval: 450 ms

## 2014-10-02 NOTE — Telephone Encounter (Signed)
Pt's pacemaker/ICD is beeping.  She has sent in transmission, I will contact Medtr[employer] and leave message for device clinic. Pt aware, she is without complaint.

## 2014-10-02 NOTE — Telephone Encounter (Signed)
Has CRT-D with [phone] lead. Seen today for alert due to high impedence alert on SVC coil. This coil was apparently turned off and the patient was told not to expect further alarms. She paged to report she did have another alert. She feels fine, has had no shocks.  I instructed her that as long as she feels well and is not having shocks that this can be addressed further. She will call the office in the AM. Patient in agreement.

## 2014-10-02 NOTE — Telephone Encounter (Signed)
Called pt in regards to red alert on [facility]. SVC lead impedance out of range. Pt heard alert tones every 4 hours starting last night. Pt is nervous about alert. Instructed pt would hear alert every 4 hours. Pt aware of appointment scheduled at 1330 with [facility] today. Pt and husband will be fine to get here due to weather.

## 2014-10-02 NOTE — Patient Instructions (Signed)

## 2014-10-02 NOTE — Progress Notes (Signed)
Patient Care Team: [name], MD as PCP - General   HPI  [name] is a 59 y.o. female seen today in follow-up for congestive heart failure in the setting of nonischemic cardiomyopathy. She is status post CRTD implantation about 4 years ago.She has a sprint Fidelis lead she reached ERI in Aprill 2013  We reversed the rate sense portion of the RV and LV leads   The patient denies chest pain, nocturnal dyspnea, orthopnea.    She's been doing relatively well functionally. She is here today because her device alarmed because of SVC high impedance.     She underwent catheterization 12/10 , coronaries were normal and filling pressures were normal  Nuke study in 2007 normal perfusion by report. Echo 5/12 EF remains in the 55-60% range  most recent echo 11/15 had an EF of 45%.   Medical history notable for breast CA in 1998 treated with Adriamycin x 4 cycles.       Past Medical History  Diagnosis Date  . CHF (congestive heart failure)     due to non-ischemic CM (possible anthracycline toxicity) - dx'd 2007 (EF 25%); cath 12/10. Coronaries normal. LVEF 50%; Right atrial pressure mean of 7, RV pressure 28/6 . Pulmonary artery pressure was 23/9 with mean of  15.  Pulmonary capillary wedge pressure mean of 9. Fick cardiac output 3.9 L per minute.  Cardiac index  2.2 L per minute per meter squared.  Pulmonary vascular resistance was 1.5  . Breast cancer     s/p mastectomy and chemo with adriamycin (1998)  . Obesity   . NSVT (nonsustained ventricular tachycardia)   . Biventricular implantable cardiac defibrillator)     Medtronic  . Nonischemic cardiomyopathy     EF 55% echo 2012  . sprint Fidelis     Past Surgical History  Procedure Laterality Date  . Biv icd      Medtronic  . Biv icd genertaor change out N/A 12/16/2011    Procedure: BIV ICD GENERTAOR CHANGE OUT;  Surgeon: [name], MD;  Location: [facility] CATH LAB;  Service: Cardiovascular;  Laterality: N/A;     Current Outpatient Prescriptions  Medication Sig Dispense Refill  . allopurinol (ZYLOPRIM) 300 MG tablet Take 300 mg by mouth daily.      . carvedilol (COREG) 12.5 MG tablet Take 1 tablet (12.5 mg total) by mouth 2 (two) times daily with a meal. 180 tablet 3  . citalopram (CELEXA) 40 MG tablet Take 40 mg by mouth daily.      . furosemide (LASIX) 80 MG tablet Take 1 tablet (80 mg total) by mouth 2 (two) times daily. 180 tablet 3  . losartan (COZAAR) 50 MG tablet Take 1 tablet (50 mg total) by mouth daily. 90 tablet 3  . metolazone (ZAROXOLYN) 2.5 MG tablet Take 1 tablet (2.5 mg total) by mouth as needed. For weight gain of 3 lbs overnight or in 1 week (Patient taking differently: Take 2.5 mg by mouth every Wednesday. ) 30 tablet 3  . multivitamin-iron-minerals-folic acid (THERAPEUTIC-M) TABS tablet Take 1 tablet by mouth daily.    Marland Kitchen omeprazole (PRILOSEC) 20 MG capsule Take 20 mg by mouth as needed (acid reflux).     . potassium chloride SA (K-DUR,KLOR-CON) 20 MEQ tablet Take 0.5 tablets (10 mEq total) by mouth daily. 45 tablet 3  . simvastatin (ZOCOR) 40 MG tablet Take 1 tablet (40 mg total) by mouth every evening. 90 tablet 6  . spironolactone (ALDACTONE) 25 MG tablet Take  1 tablet (25 mg total) by mouth daily. 90 tablet 3  . zolpidem (AMBIEN) 10 MG tablet Take 5 mg by mouth at bedtime as needed. For sleep     No current facility-administered medications for this visit.    No Known Allergies  Review of Systems negative except from HPI and PMH  Physical Exam BP 100/80 mmHg  Pulse 78  Ht 5\' 1"  (1.549 m)  Wt 207 lb 3.2 oz (93.985 kg)  BMI 39.17 kg/m2 Well developed and nourished in no acute distress HENT normal Neck supple with JVP-flat Clear Regular rate and rhythm, no murmurs or gallops Abd-soft with active BS No Clubbing cyanosis edema Skin-warm and dry A & Oriented  Grossly normal sensory and motor function  ECG demonstrates P. Synchronous pacing with a biventricular  configuration   Assessment and  Plan  Nonischemic cardiomyopathy  Congestive heart failure-chronic systolic grade 3  Defibrillator lead failure  Implantable defibrillator-Medtronic The patient's device was interrogated and the information was fully reviewed.  The device was reprogrammed to  decrease LV outputs has to preserve battery  With the defibrillator lead failure with her 6949-lead, we will have to make a decision as to whether to replace it or extracted and replaced. She is history of breast cancer and is status post left mastectomy. Hence our only vein option is on the right side. For the short-term this makes a decision to  simply turn off the SVC coil.  I have discussed with Dr. Lovena Le treatment options. He would favor at this point not intervening at all. We can continue for now to use a distal coil. I have been in touch with Medtronic to query as to whether there is a statistical connectedness between fractures of the high-voltage coils, i.e. is there increased likelihood of distal coil fracture with SVC coil fracture.  We have discussed the issues of risk of death and infection with lead extraction and or replacement procedures

## 2014-10-03 ENCOUNTER — Encounter: Payer: Self-pay | Admitting: Internal Medicine

## 2014-10-03 ENCOUNTER — Other Ambulatory Visit (HOSPITAL_COMMUNITY): Payer: Self-pay | Admitting: Cardiology

## 2014-10-03 ENCOUNTER — Ambulatory Visit (INDEPENDENT_AMBULATORY_CARE_PROVIDER_SITE_OTHER): Payer: Medicare PPO | Admitting: *Deleted

## 2014-10-03 ENCOUNTER — Telehealth: Payer: Self-pay | Admitting: Internal Medicine

## 2014-10-03 DIAGNOSIS — T829XXD Unspecified complication of cardiac and vascular prosthetic device, implant and graft, subsequent encounter: Secondary | ICD-10-CM

## 2014-10-03 DIAGNOSIS — T82198D Other mechanical complication of other cardiac electronic device, subsequent encounter: Secondary | ICD-10-CM

## 2014-10-03 LAB — MDC_IDC_ENUM_SESS_TYPE_INCLINIC
Brady Statistic AP VP Percent: 12.76 %
Brady Statistic AP VS Percent: 0.02 %
Brady Statistic AS VS Percent: 0.05 %
Brady Statistic RA Percent Paced: 12.78 %
Brady Statistic RV Percent Paced: 99.93 %
Date Time Interrogation Session: 20160127141056
HighPow Impedance: 102 Ohm
HighPow Impedance: 342 Ohm
HighPow Impedance: 418 Ohm
HighPow Impedance: 90 Ohm
Lead Channel Impedance Value: 247 Ohm
Lead Channel Impedance Value: 665 Ohm
Lead Channel Impedance Value: 760 Ohm
Lead Channel Pacing Threshold Amplitude: 0.75 V
Lead Channel Pacing Threshold Amplitude: 1.5 V
Lead Channel Pacing Threshold Pulse Width: 0.4 ms
Lead Channel Pacing Threshold Pulse Width: 0.4 ms
Lead Channel Setting Pacing Amplitude: 2.5 V
Lead Channel Setting Pacing Amplitude: 3.5 V
Lead Channel Setting Pacing Pulse Width: 0.4 ms
Lead Channel Setting Pacing Pulse Width: 0.4 ms
Lead Channel Setting Sensing Sensitivity: 0.3 mV
MDC IDC MSMT BATTERY VOLTAGE: 3.02 V
MDC IDC MSMT LEADCHNL LV PACING THRESHOLD AMPLITUDE: 0.75 V
MDC IDC MSMT LEADCHNL RA IMPEDANCE VALUE: 399 Ohm
MDC IDC MSMT LEADCHNL RA SENSING INTR AMPL: 2.875 mV
MDC IDC MSMT LEADCHNL RA SENSING INTR AMPL: 2.875 mV
MDC IDC MSMT LEADCHNL RV IMPEDANCE VALUE: 646 Ohm
MDC IDC MSMT LEADCHNL RV PACING THRESHOLD PULSEWIDTH: 0.4 ms
MDC IDC MSMT LEADCHNL RV SENSING INTR AMPL: 16.875 mV
MDC IDC MSMT LEADCHNL RV SENSING INTR AMPL: 16.875 mV
MDC IDC SET LEADCHNL RA PACING AMPLITUDE: 1.5 V
MDC IDC STAT BRADY AS VP PERCENT: 87.17 %
Zone Setting Detection Interval: 300 ms
Zone Setting Detection Interval: 340 ms
Zone Setting Detection Interval: 350 ms
Zone Setting Detection Interval: 450 ms

## 2014-10-03 MED ORDER — LOSARTAN POTASSIUM 50 MG PO TABS: 50.0000 mg | ORAL_TABLET | Freq: Every day | ORAL | Status: DC

## 2014-10-03 NOTE — Telephone Encounter (Signed)
New message       1. Has your device fired?no 2. Is you device beeping? Yes---last night at 8pm, 12:00am, 2:55am, 6:52am and 7:05am   3. Are you experiencing draining or swelling at device site? no  4. Are you calling to see if we received your device transmission? no 5. Have you passed out? no

## 2014-10-03 NOTE — Progress Notes (Signed)
New alert since yesterday's appointment. RV coil lead measured 107ohms. Turned off patient notifier for RV impedance alert but kept alert on through [place]. Changed max impedance alert from 100 to 130 ohms. ROV w/ Dr. Lovena Le 10/05/14 to discuss revision.

## 2014-10-03 NOTE — Telephone Encounter (Signed)
Pt's RV coil has now impedance anomaly of 107 ohms (if office measurement yesterday was 67 ohms). She is coming to device clinic today to reset the alert and to expand her RV coil alert parameter.  I spoke in detail to pt about appropriate and inappropriate use of her magnet due to her recalled lead. Pt expressed understanding of risks when using a magnet during legitimate episodes vs. shocks due to lead issues. Pt prefers to leave her tachy tx enabled due to risk of having them turned off.  Appt also made to discuss revision w/ Dr. Lovena Le this Fri.

## 2014-10-04 ENCOUNTER — Other Ambulatory Visit (HOSPITAL_COMMUNITY): Payer: Self-pay | Admitting: *Deleted

## 2014-10-04 MED ORDER — LOSARTAN POTASSIUM 50 MG PO TABS: 50.0000 mg | ORAL_TABLET | Freq: Every day | ORAL | Status: DC

## 2014-10-05 ENCOUNTER — Ambulatory Visit (INDEPENDENT_AMBULATORY_CARE_PROVIDER_SITE_OTHER): Payer: Medicare PPO | Admitting: Internal Medicine

## 2014-10-05 ENCOUNTER — Encounter: Payer: Self-pay | Admitting: Internal Medicine

## 2014-10-05 VITALS — BP 96/62 | HR 86 | Ht 61.0 in | Wt 210.6 lb

## 2014-10-05 DIAGNOSIS — T82198A Other mechanical complication of other cardiac electronic device, initial encounter: Secondary | ICD-10-CM

## 2014-10-05 DIAGNOSIS — I472 Ventricular tachycardia: Secondary | ICD-10-CM

## 2014-10-05 DIAGNOSIS — T829XXA Unspecified complication of cardiac and vascular prosthetic device, implant and graft, initial encounter: Secondary | ICD-10-CM

## 2014-10-05 DIAGNOSIS — I5022 Chronic systolic (congestive) heart failure: Secondary | ICD-10-CM

## 2014-10-05 DIAGNOSIS — I4729 Other ventricular tachycardia: Secondary | ICD-10-CM

## 2014-10-05 NOTE — Progress Notes (Signed)
HPI [name] is referred today by Dr. Caryl Comes for evaluation of broken ICD lead. She is a very pleasant 59 year old woman with a history of breast cancer, status post radiation and chemotherapy, who developed an Adriamycin cardiomyopathy. She underwent insertion of an ICD approximately 7-1/2 years ago. The patient has been found to have fracture in both her proximal as well as distal ICD coil. She is received no ICD shock. The shocking portion of her ICD has been turned off. She is referred to consider additional treatment options. Of note the patient has a biventricular device. She has had improvement in her left ventricular function with biventricular pacing. She is not pacemaker dependent. No Known Allergies   Current Outpatient Prescriptions  Medication Sig Dispense Refill  . allopurinol (ZYLOPRIM) 300 MG tablet Take 300 mg by mouth daily.      . carvedilol (COREG) 12.5 MG tablet Take 1 tablet (12.5 mg total) by mouth 2 (two) times daily with a meal. 180 tablet 3  . citalopram (CELEXA) 40 MG tablet Take 40 mg by mouth daily.      . furosemide (LASIX) 80 MG tablet Take 1 tablet (80 mg total) by mouth 2 (two) times daily. 180 tablet 3  . losartan (COZAAR) 50 MG tablet Take 1 tablet (50 mg total) by mouth daily. 90 tablet 3  . metolazone (ZAROXOLYN) 2.5 MG tablet Take 1 tablet (2.5 mg total) by mouth as needed. For weight gain of 3 lbs overnight or in 1 week (Patient taking differently: Take 2.5 mg by mouth every Wednesday. ) 30 tablet 3  . multivitamin-iron-minerals-folic acid (THERAPEUTIC-M) TABS tablet Take 1 tablet by mouth daily.    Marland Kitchen omeprazole (PRILOSEC) 20 MG capsule Take 20 mg by mouth as needed (acid reflux).     . potassium chloride SA (K-DUR,KLOR-CON) 20 MEQ tablet Take 0.5 tablets (10 mEq total) by mouth daily. 45 tablet 3  . simvastatin (ZOCOR) 40 MG tablet Take 1 tablet (40 mg total) by mouth every evening. 90 tablet 6  . spironolactone (ALDACTONE) 25 MG tablet Take 1  tablet (25 mg total) by mouth daily. 90 tablet 3  . zolpidem (AMBIEN) 10 MG tablet Take 5 mg by mouth at bedtime as needed. For sleep     No current facility-administered medications for this visit.     Past Medical History  Diagnosis Date  . CHF (congestive heart failure)     due to non-ischemic CM (possible anthracycline toxicity) - dx'd 2007 (EF 25%); cath 12/10. Coronaries normal. LVEF 50%; Right atrial pressure mean of 7, RV pressure 28/6 . Pulmonary artery pressure was 23/9 with mean of  15.  Pulmonary capillary wedge pressure mean of 9. Fick cardiac output 3.9 L per minute.  Cardiac index  2.2 L per minute per meter squared.  Pulmonary vascular resistance was 1.5  . Breast cancer     s/p mastectomy and chemo with adriamycin (1998)  . Obesity   . NSVT (nonsustained ventricular tachycardia)   . Biventricular implantable cardiac defibrillator)     Medtronic  . Nonischemic cardiomyopathy     EF 55% echo 2012  . sprint Fidelis     ROS:   All systems reviewed and negative except as noted in the HPI.   Past Surgical History  Procedure Laterality Date  . Biv icd      Medtronic  . Biv icd genertaor change out N/A 12/16/2011    Procedure: BIV ICD GENERTAOR CHANGE OUT;  Surgeon: Revonda Standard  Caryl Comes, MD;  Location: [facility] CATH LAB;  Service: Cardiovascular;  Laterality: N/A;     Family History  Problem Relation Age of Onset  . Diabetes Mother   . Heart disease Father   . Alzheimer's disease Father      History   Social History  . Marital Status: Married    Spouse Name: N/A    Number of Children: N/A  . Years of Education: N/A   Occupational History  . Not on file.   Social History Main Topics  . Smoking status: Never Smoker   . Smokeless tobacco: Never Used  . Alcohol Use: Not on file  . Drug Use: Not on file  . Sexual Activity: Not on file   Other Topics Concern  . Not on file   Social History Narrative     BP 96/62 mmHg  Pulse 86  Ht 5\' 1"  (1.549 m)  Wt 210  lb 9.6 oz (95.528 kg)  BMI 39.81 kg/m2  Physical Exam:  Well appearing 59 year old woman, NAD HEENT: Unremarkable Neck:  No JVD, no thyromegally Lymphatics:  No adenopathy Back:  No CVA tenderness Lungs:  Clear, with no wheezes, rales, or rhonchi. Well-healed ICD incision. HEART:  Regular rate rhythm, no murmurs, no rubs, no clicks Abd:  soft, positive bowel sounds, no organomegally, no rebound, no guarding Ext:  2 plus pulses, no edema, no cyanosis, no clubbing Skin:  No rashes no nodules Neuro:  CN II through XII intact, motor grossly intact   DEVICE  Malfunctioning ICD lead. See PaceArt for details.   Assess/Plan:

## 2014-10-05 NOTE — Patient Instructions (Signed)
We will be in touch with you early next week to schedule your lead extraction with Dr. Lovena Le.  Your physician recommends that you continue on your current medications as directed. Please refer to the Current Medication list given to you today.

## 2014-10-08 ENCOUNTER — Telehealth: Payer: Self-pay | Admitting: Internal Medicine

## 2014-10-08 NOTE — Assessment & Plan Note (Signed)
She is asymptomatic. No change in medical therapy. She has had no sustained ventricular tachycardia.

## 2014-10-08 NOTE — Telephone Encounter (Signed)
New message      Calling to see if procedure has been set up

## 2014-10-08 NOTE — Assessment & Plan Note (Signed)
The patient has a broken ICD lead. The shocking function has been discontinued. The rate sense function remains. Today we had an extensive discussion about the treatment options. One option would be to undergo watchful waiting. Another option would be to place a new ICD lead adjacent to the old, broken ICD lead. A third option would be to remove the ICD lead which is broken and insert a new ICD lead. The risk, benefits, goals, and expectations of all procedures have been discussed with the patient. She would like to proceed with ICD lead extraction and insertion of a new ICD lead. This will be scheduled once we can obtain a time in the OR and surgical backup.

## 2014-10-08 NOTE — Telephone Encounter (Signed)
Spoke with [name] at [employer] and she will let me know once date is established.  Spoke with patient and she is aware I will call her with time once I have it

## 2014-10-08 NOTE — Assessment & Plan Note (Signed)
Her chronic systolic heart failure symptoms are well compensated and class II. She will continue her current medical therapy.

## 2014-10-09 ENCOUNTER — Encounter: Payer: Self-pay | Admitting: Internal Medicine

## 2014-10-11 NOTE — Telephone Encounter (Signed)
essage  Received: 3 days ago    Margarette Canada, RN  Dionicio Stall, RN           Patient is scheduled for Thursday 2/11 at 1:30pm with Dr. Lovena Le in [place] [address]. Dr. Prescott Gum will be backing him up.   Case # is: [id number]   ThanksThurmond Butts      Patient aware

## 2014-10-12 NOTE — Pre-Procedure Instructions (Signed)
EYVA HERON  10/12/2014   Your procedure is scheduled on:  Thurs, Feb 11 @ 1:45 PM  Report to [facility]  at 11:45 AM.  Call this number if you have problems the morning of surgery: [phone]   Remember:   Do not eat food or drink liquids after midnight.   Take these medicines the morning of surgery with A SIP OF WATER: Allopurinol(Zyloprim),Zithromax(Azithromycin),Carvedilol(Coreg),Celexa(Citalopram),Digoxin(Lanoxin),Flonase(Fluticasone),and Omeprazole(Prilosec)               No Goody's,BC's,Aleve,Aspirin,Ibuprofen,Fish Oil,or any Herbal Medications   Do not wear jewelry, make-up or nail polish.  Do not wear lotions, powders, or perfumes.  Do not shave 48 hours prior to surgery.   Do not bring valuables to the hospital.  [facility] is not responsible                  for any belongings or valuables.               Contacts, dentures or bridgework may not be worn into surgery.  Leave suitcase in the car. After surgery it may be brought to your room.  For patients admitted to the hospital, discharge time is determined by your                treatment team.                 Special Instructions:  [place] - Preparing for Surgery  Before surgery, you can play an important role.  Because skin is not sterile, your skin needs to be as free of germs as possible.  You can reduce the number of germs on you skin by washing with CHG (chlorahexidine gluconate) soap before surgery.  CHG is an antiseptic cleaner which kills germs and bonds with the skin to continue killing germs even after washing.  Please DO NOT use if you have an allergy to CHG or antibacterial soaps.  If your skin becomes reddened/irritated stop using the CHG and inform your nurse when you arrive at Short Stay.  Do not shave (including legs and underarms) for at least 48 hours prior to the first CHG shower.  You may shave your face.  Please follow these instructions carefully:   1.  Shower with CHG  Soap the night before surgery and the                                morning of Surgery.  2.  If you choose to wash your hair, wash your hair first as usual with your       normal shampoo.  3.  After you shampoo, rinse your hair and body thoroughly to remove the                      Shampoo.  4.  Use CHG as you would any other liquid soap.  You can apply chg directly       to the skin and wash gently with scrungie or a clean washcloth.  5.  Apply the CHG Soap to your body ONLY FROM THE NECK DOWN.        Do not use on open wounds or open sores.  Avoid contact with your eyes,       ears, mouth and genitals (private parts).  Wash genitals (private parts)       with your normal soap.  6.  Wash  thoroughly, paying special attention to the area where your surgery        will be performed.  7.  Thoroughly rinse your body with warm water from the neck down.  8.  DO NOT shower/wash with your normal soap after using and rinsing off       the CHG Soap.  9.  Pat yourself dry with a clean towel.            10.  Wear clean pajamas.            11.  Place clean sheets on your bed the night of your first shower and do not        sleep with pets.  Day of Surgery  Do not apply any lotions/deoderants the morning of surgery.  Please wear clean clothes to the hospital/surgery center.     Please read over the following fact sheets that you were given: Pain Booklet, Coughing and Deep Breathing, MRSA Information and Surgical Site Infection Prevention

## 2014-10-15 ENCOUNTER — Encounter (HOSPITAL_COMMUNITY)
Admission: RE | Admit: 2014-10-15 | Discharge: 2014-10-15 | Disposition: A | Discharge status: Home / Self Care | Payer: Medicare PPO | Source: Ambulatory Visit | Attending: Internal Medicine | Admitting: Internal Medicine

## 2014-10-15 ENCOUNTER — Encounter (HOSPITAL_COMMUNITY): Payer: Self-pay

## 2014-10-15 DIAGNOSIS — Y838 Other surgical procedures as the cause of abnormal reaction of the patient, or of later complication, without mention of misadventure at the time of the procedure: Secondary | ICD-10-CM | POA: Diagnosis not present

## 2014-10-15 DIAGNOSIS — Z923 Personal history of irradiation: Secondary | ICD-10-CM | POA: Diagnosis not present

## 2014-10-15 DIAGNOSIS — T82198A Other mechanical complication of other cardiac electronic device, initial encounter: Secondary | ICD-10-CM | POA: Diagnosis present

## 2014-10-15 DIAGNOSIS — I429 Cardiomyopathy, unspecified: Secondary | ICD-10-CM | POA: Diagnosis not present

## 2014-10-15 DIAGNOSIS — I509 Heart failure, unspecified: Secondary | ICD-10-CM | POA: Diagnosis not present

## 2014-10-15 DIAGNOSIS — Z853 Personal history of malignant neoplasm of breast: Secondary | ICD-10-CM | POA: Diagnosis not present

## 2014-10-15 HISTORY — DX: Pneumonia, unspecified organism: J18.9

## 2014-10-15 HISTORY — DX: Personal history of other infectious and parasitic diseases: Z86.19

## 2014-10-15 HISTORY — DX: Personal history of other diseases of the respiratory system: Z87.09

## 2014-10-15 HISTORY — DX: Gastro-esophageal reflux disease without esophagitis: K21.9

## 2014-10-15 HISTORY — DX: Insomnia, unspecified: G47.00

## 2014-10-15 HISTORY — DX: Reserved for inherently not codable concepts without codable children: IMO0001

## 2014-10-15 HISTORY — DX: Gout, unspecified: M10.9

## 2014-10-15 HISTORY — DX: Hyperlipidemia, unspecified: E78.5

## 2014-10-15 HISTORY — DX: Presence of automatic (implantable) cardiac defibrillator: Z95.810

## 2014-10-15 HISTORY — DX: Presence of cardiac pacemaker: Z95.0

## 2014-10-15 LAB — BASIC METABOLIC PANEL
ANION GAP: 9 (ref 5–15)
BUN: 16 mg/dL (ref 6–23)
CHLORIDE: 99 mmol/L (ref 96–112)
CO2: 29 mmol/L (ref 19–32)
CREATININE: 0.97 mg/dL (ref 0.50–1.10)
Calcium: 9.6 mg/dL (ref 8.4–10.5)
GFR calc Af Amer: 72 mL/min — ABNORMAL LOW (ref >90)
GFR, EST NON AFRICAN AMERICAN: 62 mL/min — AB (ref >90)
GLUCOSE: 131 mg/dL — AB (ref 70–99)
POTASSIUM: 3.5 mmol/L (ref 3.5–5.1)
Sodium: 137 mmol/L (ref 135–145)

## 2014-10-15 LAB — SURGICAL PCR SCREEN
MRSA, PCR: NEGATIVE
Staphylococcus aureus: NEGATIVE

## 2014-10-15 LAB — CBC
HCT: 39.6 % (ref 36.0–46.0)
Hemoglobin: 12.7 g/dL (ref 12.0–15.0)
MCH: 30.8 pg (ref 26.0–34.0)
MCHC: 32.1 g/dL (ref 30.0–36.0)
MCV: 96.1 fL (ref 78.0–100.0)
Platelets: 261 10*3/uL (ref 150–400)
RBC: 4.12 MIL/uL (ref 3.87–5.11)
RDW: 13.9 % (ref 11.5–15.5)
WBC: 10 10*3/uL (ref 4.0–10.5)

## 2014-10-15 NOTE — Progress Notes (Addendum)
Multiple echo reports in epic with most recent in 2015  Heart cath reports in epic from 2010 and 2013  Stress test done in 2007  EKG in epic from 10-02-14  Medical Md is Dr.Nicholas Kipreos with [facility] in [place]  Cardiologist is Dr.Benisome with last visit in Dec 2015  Denies CXR in past yr

## 2014-10-15 NOTE — Progress Notes (Signed)
Notified Tomi Bamberger with [employer] about surgery on the 11th and surgery time.

## 2014-10-17 MED ORDER — SODIUM CHLORIDE 0.9 % IR SOLN
80.0000 mg | Status: DC
  Filled 2014-10-17: qty 2

## 2014-10-17 MED ORDER — CEFAZOLIN SODIUM-DEXTROSE 2-3 GM-% IV SOLR
2.0000 g | INTRAVENOUS | Status: DC
  Filled 2014-10-17: qty 50

## 2014-10-17 MED ORDER — SODIUM CHLORIDE 0.9 % IV SOLN: INTRAVENOUS | Status: DC

## 2014-10-18 ENCOUNTER — Ambulatory Visit (HOSPITAL_COMMUNITY): Payer: Medicare PPO

## 2014-10-18 ENCOUNTER — Ambulatory Visit (HOSPITAL_COMMUNITY): Payer: Medicare PPO | Admitting: Certified Registered Nurse Anesthetist

## 2014-10-18 ENCOUNTER — Encounter (HOSPITAL_COMMUNITY)
Admission: RE | Disposition: A | Discharge status: Home / Self Care | Payer: Self-pay | Source: Ambulatory Visit | Attending: Internal Medicine

## 2014-10-18 ENCOUNTER — Encounter (HOSPITAL_COMMUNITY): Payer: Self-pay | Admitting: Certified Registered Nurse Anesthetist

## 2014-10-18 ENCOUNTER — Ambulatory Visit (HOSPITAL_COMMUNITY)
Admission: RE | Admit: 2014-10-18 | Discharge: 2014-10-19 | Disposition: A | Discharge status: Home / Self Care | Payer: Medicare PPO | Source: Ambulatory Visit | Attending: Internal Medicine | Admitting: Internal Medicine

## 2014-10-18 DIAGNOSIS — Z853 Personal history of malignant neoplasm of breast: Secondary | ICD-10-CM | POA: Diagnosis not present

## 2014-10-18 DIAGNOSIS — Z923 Personal history of irradiation: Secondary | ICD-10-CM | POA: Diagnosis not present

## 2014-10-18 DIAGNOSIS — T82198A Other mechanical complication of other cardiac electronic device, initial encounter: Secondary | ICD-10-CM | POA: Insufficient documentation

## 2014-10-18 DIAGNOSIS — I429 Cardiomyopathy, unspecified: Secondary | ICD-10-CM | POA: Diagnosis not present

## 2014-10-18 DIAGNOSIS — Y838 Other surgical procedures as the cause of abnormal reaction of the patient, or of later complication, without mention of misadventure at the time of the procedure: Secondary | ICD-10-CM | POA: Insufficient documentation

## 2014-10-18 DIAGNOSIS — I509 Heart failure, unspecified: Secondary | ICD-10-CM | POA: Insufficient documentation

## 2014-10-18 DIAGNOSIS — Z95810 Presence of automatic (implantable) cardiac defibrillator: Secondary | ICD-10-CM

## 2014-10-18 DIAGNOSIS — T82110A Breakdown (mechanical) of cardiac electrode, initial encounter: Secondary | ICD-10-CM

## 2014-10-18 HISTORY — PX: ICD LEAD REMOVAL: SHX5855

## 2014-10-18 LAB — ABO/RH: ABO/RH(D): A NEG

## 2014-10-18 LAB — PREPARE RBC (CROSSMATCH)

## 2014-10-18 SURGERY — REMOVAL, ELECTRODE LEAD, ICD
Anesthesia: General | Site: Chest | Laterality: Right

## 2014-10-18 MED ORDER — SODIUM CHLORIDE 0.9 % IJ SOLN
INTRAMUSCULAR | Status: AC
  Filled 2014-10-18: qty 20

## 2014-10-18 MED ORDER — CARVEDILOL 12.5 MG PO TABS
12.5000 mg | ORAL_TABLET | Freq: Two times a day (BID) | ORAL | Status: DC
  Administered 2014-10-19: 12.5 mg via ORAL
  Filled 2014-10-18 (×3): qty 1

## 2014-10-18 MED ORDER — LIDOCAINE HCL (PF) 1 % IJ SOLN
INTRAMUSCULAR | Status: AC
  Filled 2014-10-18: qty 30

## 2014-10-18 MED ORDER — PROPOFOL 10 MG/ML IV BOLUS
INTRAVENOUS | Status: AC
  Filled 2014-10-18: qty 20

## 2014-10-18 MED ORDER — FENTANYL CITRATE 0.05 MG/ML IJ SOLN
INTRAMUSCULAR | Status: DC | PRN
  Administered 2014-10-18: 150 ug via INTRAVENOUS

## 2014-10-18 MED ORDER — ONDANSETRON HCL 4 MG/2ML IJ SOLN
INTRAMUSCULAR | Status: AC
  Filled 2014-10-18: qty 2

## 2014-10-18 MED ORDER — CEFAZOLIN SODIUM-DEXTROSE 2-3 GM-% IV SOLR
INTRAVENOUS | Status: DC | PRN
  Administered 2014-10-18: 2 g via INTRAVENOUS

## 2014-10-18 MED ORDER — CEFAZOLIN SODIUM 1-5 GM-% IV SOLN
1.0000 g | Freq: Four times a day (QID) | INTRAVENOUS | Status: AC
  Administered 2014-10-18 – 2014-10-19 (×3): 1 g via INTRAVENOUS
  Filled 2014-10-18 (×3): qty 50

## 2014-10-18 MED ORDER — ONDANSETRON HCL 4 MG/2ML IJ SOLN: 4.0000 mg | Freq: Four times a day (QID) | INTRAMUSCULAR | Status: DC | PRN

## 2014-10-18 MED ORDER — PROPOFOL 10 MG/ML IV BOLUS
INTRAVENOUS | Status: DC | PRN
  Administered 2014-10-18: 130 mg via INTRAVENOUS

## 2014-10-18 MED ORDER — SODIUM CHLORIDE 0.9 % IR SOLN
Status: DC | PRN
  Administered 2014-10-18: 500 mL

## 2014-10-18 MED ORDER — POTASSIUM CHLORIDE CRYS ER 10 MEQ PO TBCR
10.0000 meq | EXTENDED_RELEASE_TABLET | Freq: Every day | ORAL | Status: DC
  Administered 2014-10-18 – 2014-10-19 (×2): 10 meq via ORAL
  Filled 2014-10-18 (×2): qty 1

## 2014-10-18 MED ORDER — SIMVASTATIN 40 MG PO TABS
40.0000 mg | ORAL_TABLET | Freq: Every evening | ORAL | Status: DC
  Administered 2014-10-18: 40 mg via ORAL
  Filled 2014-10-18 (×2): qty 1

## 2014-10-18 MED ORDER — MEPERIDINE HCL 25 MG/ML IJ SOLN: 6.2500 mg | INTRAMUSCULAR | Status: DC | PRN

## 2014-10-18 MED ORDER — HYDROMORPHONE HCL 1 MG/ML IJ SOLN: 0.2500 mg | INTRAMUSCULAR | Status: DC | PRN

## 2014-10-18 MED ORDER — SPIRONOLACTONE 25 MG PO TABS
25.0000 mg | ORAL_TABLET | Freq: Every day | ORAL | Status: DC
  Administered 2014-10-18 – 2014-10-19 (×2): 25 mg via ORAL
  Filled 2014-10-18 (×3): qty 1

## 2014-10-18 MED ORDER — MIDAZOLAM HCL 5 MG/5ML IJ SOLN
INTRAMUSCULAR | Status: DC | PRN
  Administered 2014-10-18: 2 mg via INTRAVENOUS

## 2014-10-18 MED ORDER — GLYCOPYRROLATE 0.2 MG/ML IJ SOLN
INTRAMUSCULAR | Status: DC | PRN
  Administered 2014-10-18: 0.4 mg via INTRAVENOUS

## 2014-10-18 MED ORDER — MIDAZOLAM HCL 2 MG/2ML IJ SOLN
INTRAMUSCULAR | Status: AC
  Filled 2014-10-18: qty 2

## 2014-10-18 MED ORDER — ONDANSETRON HCL 4 MG/2ML IJ SOLN: 4.0000 mg | Freq: Once | INTRAMUSCULAR | Status: DC | PRN

## 2014-10-18 MED ORDER — NEOSTIGMINE METHYLSULFATE 10 MG/10ML IV SOLN
INTRAVENOUS | Status: DC | PRN
  Administered 2014-10-18: 3 mg via INTRAVENOUS

## 2014-10-18 MED ORDER — LACTATED RINGERS IV SOLN
INTRAVENOUS | Status: DC | PRN
  Administered 2014-10-18 (×2): via INTRAVENOUS

## 2014-10-18 MED ORDER — LOSARTAN POTASSIUM 50 MG PO TABS
50.0000 mg | ORAL_TABLET | Freq: Every day | ORAL | Status: DC
  Administered 2014-10-19: 50 mg via ORAL
  Filled 2014-10-18: qty 1

## 2014-10-18 MED ORDER — CHLORHEXIDINE GLUCONATE 4 % EX LIQD
60.0000 mL | Freq: Once | CUTANEOUS | Status: DC
Start: 2014-10-18 — End: 2014-10-18
  Filled 2014-10-18: qty 60

## 2014-10-18 MED ORDER — ZOLPIDEM TARTRATE 5 MG PO TABS
5.0000 mg | ORAL_TABLET | Freq: Every evening | ORAL | Status: DC | PRN
  Administered 2014-10-19: 5 mg via ORAL
  Filled 2014-10-18: qty 1

## 2014-10-18 MED ORDER — FUROSEMIDE 40 MG PO TABS: 40.0000 mg | ORAL_TABLET | Freq: Two times a day (BID) | ORAL | Status: DC

## 2014-10-18 MED ORDER — FENTANYL CITRATE 0.05 MG/ML IJ SOLN
INTRAMUSCULAR | Status: AC
  Filled 2014-10-18: qty 5

## 2014-10-18 MED ORDER — ACETAMINOPHEN 325 MG PO TABS
325.0000 mg | ORAL_TABLET | ORAL | Status: DC | PRN
  Administered 2014-10-18 – 2014-10-19 (×3): 650 mg via ORAL
  Filled 2014-10-18 (×3): qty 2

## 2014-10-18 MED ORDER — SODIUM CHLORIDE 0.9 % IR SOLN
Freq: Once | Status: AC
  Administered 2014-10-18: 500 mL
  Filled 2014-10-18: qty 2

## 2014-10-18 MED ORDER — LIDOCAINE HCL (PF) 1 % IJ SOLN
INTRAMUSCULAR | Status: DC | PRN
  Administered 2014-10-18: 30 mL

## 2014-10-18 MED ORDER — ALLOPURINOL 300 MG PO TABS
300.0000 mg | ORAL_TABLET | Freq: Every day | ORAL | Status: DC
  Administered 2014-10-18 – 2014-10-19 (×2): 300 mg via ORAL
  Filled 2014-10-18 (×2): qty 1

## 2014-10-18 MED ORDER — LIDOCAINE HCL (CARDIAC) 20 MG/ML IV SOLN
INTRAVENOUS | Status: AC
  Filled 2014-10-18: qty 5

## 2014-10-18 MED ORDER — FUROSEMIDE 20 MG PO TABS
60.0000 mg | ORAL_TABLET | Freq: Every day | ORAL | Status: DC
  Administered 2014-10-19: 60 mg via ORAL
  Filled 2014-10-18 (×2): qty 1

## 2014-10-18 MED ORDER — ROCURONIUM BROMIDE 100 MG/10ML IV SOLN
INTRAVENOUS | Status: DC | PRN
  Administered 2014-10-18: 40 mg via INTRAVENOUS
  Administered 2014-10-18: 10 mg via INTRAVENOUS

## 2014-10-18 MED ORDER — FUROSEMIDE 40 MG PO TABS
40.0000 mg | ORAL_TABLET | Freq: Every day | ORAL | Status: DC
  Filled 2014-10-18: qty 1

## 2014-10-18 MED ORDER — LACTATED RINGERS IV SOLN
INTRAVENOUS | Status: DC
  Administered 2014-10-18: 13:00:00 via INTRAVENOUS

## 2014-10-18 MED ORDER — PHENYLEPHRINE HCL 10 MG/ML IJ SOLN
10.0000 mg | INTRAMUSCULAR | Status: DC | PRN
  Administered 2014-10-18: 40 ug/min via INTRAVENOUS

## 2014-10-18 MED ORDER — ROCURONIUM BROMIDE 50 MG/5ML IV SOLN
INTRAVENOUS | Status: AC
  Filled 2014-10-18: qty 1

## 2014-10-18 MED ORDER — ONDANSETRON HCL 4 MG/2ML IJ SOLN
INTRAMUSCULAR | Status: DC | PRN
  Administered 2014-10-18: 4 mg via INTRAVENOUS

## 2014-10-18 SURGICAL SUPPLY — 64 items
BAG BANDED W/RUBBER/TAPE 36X54 (MISCELLANEOUS) IMPLANT
BAG DECANTER FOR FLEXI CONT (MISCELLANEOUS) ×6 IMPLANT
BAG EQP BAND 135X91 W/RBR TAPE (MISCELLANEOUS)
BLADE 10 SAFETY STRL DISP (BLADE) ×1 IMPLANT
BLADE STERNUM SYSTEM 6 (BLADE) IMPLANT
BLADE SURG 10 STRL SS (BLADE) ×2 IMPLANT
BLADE SURG ROTATE 9660 (MISCELLANEOUS) ×2 IMPLANT
BNDG ADH 5X4 AIR PERM ELC (GAUZE/BANDAGES/DRESSINGS) ×1
BNDG COHESIVE 4X5 WHT NS (GAUZE/BANDAGES/DRESSINGS) ×3 IMPLANT
CANISTER SUCTION 2500CC (MISCELLANEOUS) ×3 IMPLANT
CLOSURE WOUND 1/2 X4 (GAUZE/BANDAGES/DRESSINGS) ×1
COIL ONE TIE COMPRESSION (MISCELLANEOUS) ×2 IMPLANT
COVER TABLE BACK 60X90 (DRAPES) ×3 IMPLANT
DRAPE C-ARM 42X72 X-RAY (DRAPES) IMPLANT
DRAPE CARDIOVASCULAR INCISE (DRAPES) ×3
DRAPE INCISE IOBAN 66X45 STRL (DRAPES) IMPLANT
DRAPE PROXIMA HALF (DRAPES) ×4 IMPLANT
DRAPE SRG 135X102X78XABS (DRAPES) ×1 IMPLANT
DRSG TEGADERM 4X4.75 (GAUZE/BANDAGES/DRESSINGS) ×2 IMPLANT
ELECT REM PT RETURN 9FT ADLT (ELECTROSURGICAL) ×6
ELECTRODE REM PT RTRN 9FT ADLT (ELECTROSURGICAL) ×2 IMPLANT
GAUZE PACKING IODOFORM 1 (PACKING) IMPLANT
GAUZE SPONGE 4X4 12PLY STRL (GAUZE/BANDAGES/DRESSINGS) ×1 IMPLANT
GAUZE SPONGE 4X4 16PLY XRAY LF (GAUZE/BANDAGES/DRESSINGS) IMPLANT
GLOVE BIO SURGEON STRL SZ 6.5 (GLOVE) ×1 IMPLANT
GLOVE BIO SURGEON STRL SZ7.5 (GLOVE) ×2 IMPLANT
GLOVE BIO SURGEON STRL SZ8.5 (GLOVE) ×2 IMPLANT
GLOVE BIO SURGEONS STRL SZ 6.5 (GLOVE) ×1
GLOVE BIOGEL PI IND STRL 6.5 (GLOVE) IMPLANT
GLOVE BIOGEL PI IND STRL 7.5 (GLOVE) ×1 IMPLANT
GLOVE BIOGEL PI IND STRL 8.5 (GLOVE) IMPLANT
GLOVE BIOGEL PI INDICATOR 6.5 (GLOVE) ×2
GLOVE BIOGEL PI INDICATOR 7.5 (GLOVE) ×2
GLOVE BIOGEL PI INDICATOR 8.5 (GLOVE) ×2
GLOVE ECLIPSE 8.0 STRL XLNG CF (GLOVE) ×3 IMPLANT
GOWN STRL REUS W/ TWL LRG LVL3 (GOWN DISPOSABLE) ×1 IMPLANT
GOWN STRL REUS W/ TWL XL LVL3 (GOWN DISPOSABLE) ×1 IMPLANT
GOWN STRL REUS W/TWL 2XL LVL3 (GOWN DISPOSABLE) ×2 IMPLANT
GOWN STRL REUS W/TWL LRG LVL3 (GOWN DISPOSABLE) ×3
GOWN STRL REUS W/TWL XL LVL3 (GOWN DISPOSABLE) ×3
GUIDEWIRE AMPLATZ STIFF 0.35 (WIRE) ×2 IMPLANT
GUIDEWIRE ANGLED .035X150CM (WIRE) ×2 IMPLANT
ICD VIVA XT CRT-D DTBA1D1 (ICD Generator) ×2 IMPLANT
KIT ROOM TURNOVER OR (KITS) ×3 IMPLANT
LEAD SPRINT QUAT SEC 6935-65CM (Lead) ×2 IMPLANT
NS IRRIG 1000ML POUR BTL (IV SOLUTION) IMPLANT
PAD ARMBOARD 7.5X6 YLW CONV (MISCELLANEOUS) ×6 IMPLANT
PAD ELECT DEFIB RADIOL ZOLL (MISCELLANEOUS) ×3 IMPLANT
SHEATH EVOLUTION RL 11F (SHEATH) ×2 IMPLANT
SHEATH EVOLUTION RL 9F (SHEATH) ×2 IMPLANT
SHEATH EVOLUTION SHORTIE RL 9F (SHEATH) ×2 IMPLANT
SPONGE LAP 18X18 X RAY DECT (DISPOSABLE) ×2 IMPLANT
STRIP CLOSURE SKIN 1/2X4 (GAUZE/BANDAGES/DRESSINGS) ×1 IMPLANT
STYLET LIBERATOR LOCKING (MISCELLANEOUS) ×2 IMPLANT
SUT PROLENE 2 0 CT2 30 (SUTURE) IMPLANT
SUT PROLENE 2 0 SH DA (SUTURE) IMPLANT
SUT VIC AB 2-0 CT2 18 VCP726D (SUTURE) IMPLANT
SUT VIC AB 3-0 X1 27 (SUTURE) IMPLANT
TOWEL OR 17X24 6PK STRL BLUE (TOWEL DISPOSABLE) ×4 IMPLANT
TOWEL OR 17X26 10 PK STRL BLUE (TOWEL DISPOSABLE) ×4 IMPLANT
TRAY FOLEY IC TEMP SENS 14FR (CATHETERS) ×3 IMPLANT
TUBE CONNECTING 12'X1/4 (SUCTIONS) ×1
TUBE CONNECTING 12X1/4 (SUCTIONS) ×2 IMPLANT
YANKAUER SUCT BULB TIP NO VENT (SUCTIONS) ×3 IMPLANT

## 2014-10-18 NOTE — Anesthesia Preprocedure Evaluation (Signed)
Anesthesia Evaluation  Patient identified by MRN, date of birth, ID band Patient awake    Reviewed: Allergy & Precautions, NPO status , Patient's Chart, lab work & pertinent test results  Airway Mallampati: I  TM Distance: >3 FB Neck ROM: Full    Dental   Pulmonary          Cardiovascular +CHF + pacemaker + Cardiac Defibrillator     Neuro/Psych    GI/Hepatic   Endo/Other    Renal/GU      Musculoskeletal   Abdominal   Peds  Hematology   Anesthesia Other Findings   Reproductive/Obstetrics                             Anesthesia Physical Anesthesia Plan  ASA: III  Anesthesia Plan: General   Post-op Pain Management:    Induction: Intravenous  Airway Management Planned: Oral ETT  Additional Equipment: Arterial line  Intra-op Plan:   Post-operative Plan: Extubation in OR  Informed Consent: I have reviewed the patients History and Physical, chart, labs and discussed the procedure including the risks, benefits and alternatives for the proposed anesthesia with the patient or authorized representative who has indicated his/her understanding and acceptance.     Plan Discussed with: CRNA and Surgeon  Anesthesia Plan Comments:         Anesthesia Quick Evaluation

## 2014-10-18 NOTE — Interval H&P Note (Signed)
History and Physical Interval Note:  10/18/2014 1:08 PM  [name]  has presented today for surgery, with the diagnosis of LEAD EXTRACTION  The various methods of treatment have been discussed with the patient and family. After consideration of risks, benefits and other options for treatment, the patient has consented to  Procedure(s) with comments: ICD LEAD REMOVAL/EXTRACTION (Right) - PVT is back up as a surgical intervention .  The patient's history has been reviewed, patient examined, no change in status, stable for surgery.  I have reviewed the patient's chart and labs.  Questions were answered to the patient's satisfaction.     Mikle Bosworth.D.

## 2014-10-18 NOTE — Progress Notes (Signed)
Orthopedic Tech Progress Note Patient Details:  [name] [birth year] [id number]  Ortho Devices Type of Ortho Device: Arm sling Ortho Device/Splint Location: RUE Ortho Device/Splint Interventions: Criss Alvine 10/18/2014, 7:17 PM

## 2014-10-18 NOTE — Transfer of Care (Signed)
Immediate Anesthesia Transfer of Care Note  Patient: [name]  Procedure(s) Performed: Procedure(s): ICD LEAD REMOVAL/EXTRACTION/REIMPLANTATION (Right)  Patient Location: PACU  Anesthesia Type:General  Level of Consciousness: awake, alert , oriented and patient cooperative  Airway & Oxygen Therapy: Patient Spontanous Breathing and Patient connected to face mask oxygen  Post-op Assessment: Report given to RN, Post -op Vital signs reviewed and stable and Patient moving all extremities  Post vital signs: Reviewed and stable  Last Vitals:  Filed Vitals:   10/18/14 1134  BP: 104/65  Pulse: 80  Temp: 36.3 C  Resp: 20    Complications: No apparent anesthesia complications

## 2014-10-18 NOTE — CV Procedure (Signed)
Electrophysiology procedure note  Procedure: Extraction of a right ventricular ICD lead, removal of a previously implanted biventricular ICD generator, insertion of a new ICD lead, ICD pocket revision, and venography of the right upper extremity venous system  Preprocedure diagnosis: Broken ICD lead in a patient with prior cardiomyopathy status post biventricular ICD implantation  Postprocedure diagnosis: Same as preprocedure diagnosis  Description of the procedure: After informed consent was obtained, the patient was taken to the operating room in the fasting state. After the usual preparation draping, the anesthesia service was utilized to provide general anesthesia. 30 cc of lidocaine was infiltrated into the right infraclavicular region. A 6 cm incision was carried out. Electrocautery was utilized to dissect down to the ICD pocket. The leads were freed up from the fibrous scar tissue with electrocautery. The defibrillator lead which was known to be broken, was freed up from its scar tissue. The sewing sleeve was cut and the 60 cm stylette was advanced into the tip of the right ventricular/ICD lead. The helix was attempted to be retracted but did not move. At this point, the lead was cut and a liberator locking stylette advanced into the tip of the defibrillation lead. Over one tie suture was placed on the proximal portion of the lead fixing the proximal portion of the lead. The 9 French Cook Shorty RL extraction sheath was advanced over the lead and into the central circulation. There was dense fibrous scar tissue. The standard length 9 French Cook RL extraction sheath was then advanced over the defibrillator lead. It got bound up at the junction between the right subclavian vein and the superior vena cava. The 9 French Cook sheath was removed, and the 11 [employer] sheath was advanced over the defibrillation lead. The larger 108 French lead was able to traverse the very dense fibrous binding sites and  be advanced into the superior vena cava. At this point, the tip of the right ventricular lead retracted back towards the tricuspid valve. There is transient hypotension with a systolic blood pressure dropping into the 50s. Fluid bolus was given. The sheath was advanced over the partially freed up lead and the lead was removed with gentle traction. Unfortunately, the sheath was also removed and venous access was lost. The blood pressure improved gradually back to normal with minimal amounts of epinephrine.  Initial attempts to gain access back to the central circulation were unsuccessful. 20 cc of IV contrast were injected into the right upper extremity venous system and a venogram demonstrated that the right subclavian vein and superior vena cava were patent. In addition, there was no extravasation of contrast into the chest noted. The right subclavian vein was then punctured and the Medtronic model 6935 active fixation defibrillation lead, serial number [id number] was inserted into the right ventricle. Mapping was carried out. The R waves were small throughout. The lead was actively fixed. The R waves measured 4-5 mV. Pacing impedance and pacing threshold were satisfactory, 800 ohms, and less than a volt at 0.5 ms respectively. The lead was secured to the fascia with silk suture and the sewing sleeve was secured with silk suture. The pocket was irrigated. The new Medtronic biventricular ICD, serial number [id number], was connected to the old atrial and LV lead and new RV lead. Because of low R waves, the RV lead was placed in the LV port. The LV lead was placed in the RV rate sense port. The pocket was irrigated with antibiotic irrigation. The pocket was revised to accommodate  the different shape of the new device. The incision was closed with 2 layers of Vicryl suture. The patient was returned to the recovery area in satisfactory condition.  Complications: The procedure was complicated by transient hypotension  which resolved spontaneously. It was thought that invagination of the right ventricular free wall was the etiology of the patient's transient hypotension.  Conclusion: Successful extraction of a broken 6949 Medtronic defibrillation lead with removal of the previously implanted ICD which had less than 2 years of battery longevity, and successful insertion of a new Medtronic 6935 active fixation defibrillation lead.  Cristopher Peru, M.D.

## 2014-10-18 NOTE — Anesthesia Procedure Notes (Signed)
Procedure Name: Intubation Date/Time: 10/18/2014 3:58 PM Performed by: Izora Gala Pre-anesthesia Checklist: Patient identified, Emergency Drugs available, Suction available and Patient being monitored Patient Re-evaluated:Patient Re-evaluated prior to inductionOxygen Delivery Method: Circle system utilized Preoxygenation: Pre-oxygenation with 100% oxygen Intubation Type: IV induction Ventilation: Mask ventilation without difficulty Laryngoscope Size: Miller and 3 Grade View: Grade I Tube type: Oral Tube size: 7.5 mm Number of attempts: 1 Airway Equipment and Method: Stylet Placement Confirmation: ETT inserted through vocal cords under direct vision,  positive ETCO2 and CO2 detector Secured at: 21 cm Tube secured with: Tape Dental Injury: Teeth and Oropharynx as per pre-operative assessment

## 2014-10-18 NOTE — Anesthesia Postprocedure Evaluation (Signed)
  Anesthesia Post-op Note  Patient: [name]  Procedure(s) Performed: Procedure(s): ICD LEAD REMOVAL/EXTRACTION/REIMPLANTATION (Right)  Patient Location: PACU  Anesthesia Type:General  Level of Consciousness: awake and alert   Airway and Oxygen Therapy: Patient Spontanous Breathing  Post-op Pain: none  Post-op Assessment: Post-op Vital signs reviewed, Patient's Cardiovascular Status Stable and Respiratory Function Stable  Post-op Vital Signs: Reviewed  Filed Vitals:   10/18/14 1836  BP: 121/60  Pulse: 93  Temp:   Resp: 20    Complications: No apparent anesthesia complications

## 2014-10-18 NOTE — H&P (View-Only) (Signed)
HPI [name] is referred today by Dr. Caryl Comes for evaluation of broken ICD lead. She is a very pleasant 59 year old woman with a history of breast cancer, status post radiation and chemotherapy, who developed an Adriamycin cardiomyopathy. She underwent insertion of an ICD approximately 7-1/2 years ago. The patient has been found to have fracture in both her proximal as well as distal ICD coil. She is received no ICD shock. The shocking portion of her ICD has been turned off. She is referred to consider additional treatment options. Of note the patient has a biventricular device. She has had improvement in her left ventricular function with biventricular pacing. She is not pacemaker dependent. No Known Allergies   Current Outpatient Prescriptions  Medication Sig Dispense Refill  . allopurinol (ZYLOPRIM) 300 MG tablet Take 300 mg by mouth daily.      . carvedilol (COREG) 12.5 MG tablet Take 1 tablet (12.5 mg total) by mouth 2 (two) times daily with a meal. 180 tablet 3  . citalopram (CELEXA) 40 MG tablet Take 40 mg by mouth daily.      . furosemide (LASIX) 80 MG tablet Take 1 tablet (80 mg total) by mouth 2 (two) times daily. 180 tablet 3  . losartan (COZAAR) 50 MG tablet Take 1 tablet (50 mg total) by mouth daily. 90 tablet 3  . metolazone (ZAROXOLYN) 2.5 MG tablet Take 1 tablet (2.5 mg total) by mouth as needed. For weight gain of 3 lbs overnight or in 1 week (Patient taking differently: Take 2.5 mg by mouth every Wednesday. ) 30 tablet 3  . multivitamin-iron-minerals-folic acid (THERAPEUTIC-M) TABS tablet Take 1 tablet by mouth daily.    Marland Kitchen omeprazole (PRILOSEC) 20 MG capsule Take 20 mg by mouth as needed (acid reflux).     . potassium chloride SA (K-DUR,KLOR-CON) 20 MEQ tablet Take 0.5 tablets (10 mEq total) by mouth daily. 45 tablet 3  . simvastatin (ZOCOR) 40 MG tablet Take 1 tablet (40 mg total) by mouth every evening. 90 tablet 6  . spironolactone (ALDACTONE) 25 MG tablet Take 1  tablet (25 mg total) by mouth daily. 90 tablet 3  . zolpidem (AMBIEN) 10 MG tablet Take 5 mg by mouth at bedtime as needed. For sleep     No current facility-administered medications for this visit.     Past Medical History  Diagnosis Date  . CHF (congestive heart failure)     due to non-ischemic CM (possible anthracycline toxicity) - dx'd 2007 (EF 25%); cath 12/10. Coronaries normal. LVEF 50%; Right atrial pressure mean of 7, RV pressure 28/6 . Pulmonary artery pressure was 23/9 with mean of  15.  Pulmonary capillary wedge pressure mean of 9. Fick cardiac output 3.9 L per minute.  Cardiac index  2.2 L per minute per meter squared.  Pulmonary vascular resistance was 1.5  . Breast cancer     s/p mastectomy and chemo with adriamycin (1998)  . Obesity   . NSVT (nonsustained ventricular tachycardia)   . Biventricular implantable cardiac defibrillator)     Medtronic  . Nonischemic cardiomyopathy     EF 55% echo 2012  . sprint Fidelis     ROS:   All systems reviewed and negative except as noted in the HPI.   Past Surgical History  Procedure Laterality Date  . Biv icd      Medtronic  . Biv icd genertaor change out N/A 12/16/2011    Procedure: BIV ICD GENERTAOR CHANGE OUT;  Surgeon: Revonda Standard  Caryl Comes, MD;  Location: [facility] CATH LAB;  Service: Cardiovascular;  Laterality: N/A;     Family History  Problem Relation Age of Onset  . Diabetes Mother   . Heart disease Father   . Alzheimer's disease Father      History   Social History  . Marital Status: Married    Spouse Name: N/A    Number of Children: N/A  . Years of Education: N/A   Occupational History  . Not on file.   Social History Main Topics  . Smoking status: Never Smoker   . Smokeless tobacco: Never Used  . Alcohol Use: Not on file  . Drug Use: Not on file  . Sexual Activity: Not on file   Other Topics Concern  . Not on file   Social History Narrative     BP 96/62 mmHg  Pulse 86  Ht 5\' 1"  (1.549 m)  Wt 210  lb 9.6 oz (95.528 kg)  BMI 39.81 kg/m2  Physical Exam:  Well appearing 59 year old woman, NAD HEENT: Unremarkable Neck:  No JVD, no thyromegally Lymphatics:  No adenopathy Back:  No CVA tenderness Lungs:  Clear, with no wheezes, rales, or rhonchi. Well-healed ICD incision. HEART:  Regular rate rhythm, no murmurs, no rubs, no clicks Abd:  soft, positive bowel sounds, no organomegally, no rebound, no guarding Ext:  2 plus pulses, no edema, no cyanosis, no clubbing Skin:  No rashes no nodules Neuro:  CN II through XII intact, motor grossly intact   DEVICE  Malfunctioning ICD lead. See PaceArt for details.   Assess/Plan:

## 2014-10-19 ENCOUNTER — Ambulatory Visit (HOSPITAL_COMMUNITY): Payer: Medicare PPO

## 2014-10-19 DIAGNOSIS — I429 Cardiomyopathy, unspecified: Secondary | ICD-10-CM | POA: Diagnosis not present

## 2014-10-19 DIAGNOSIS — Z923 Personal history of irradiation: Secondary | ICD-10-CM | POA: Diagnosis not present

## 2014-10-19 DIAGNOSIS — T82110D Breakdown (mechanical) of cardiac electrode, subsequent encounter: Secondary | ICD-10-CM

## 2014-10-19 DIAGNOSIS — Z853 Personal history of malignant neoplasm of breast: Secondary | ICD-10-CM | POA: Diagnosis not present

## 2014-10-19 DIAGNOSIS — T82198A Other mechanical complication of other cardiac electronic device, initial encounter: Secondary | ICD-10-CM | POA: Diagnosis not present

## 2014-10-19 MED ORDER — CETYLPYRIDINIUM CHLORIDE 0.05 % MT LIQD
7.0000 mL | Freq: Two times a day (BID) | OROMUCOSAL | Status: DC
  Administered 2014-10-19: 7 mL via OROMUCOSAL

## 2014-10-19 NOTE — Discharge Instructions (Signed)
° ° °  Supplemental Discharge Instructions for  Pacemaker/Defibrillator Patients  Activity No heavy lifting or vigorous activity with your left/right arm for 6 to 8 weeks.  Do not raise your left/right arm above your head for one week.  Gradually raise your affected arm as drawn below.           __        2/16           /        2/17          /      2/18           /        2/19        NO DRIVING for     ; you may begin driving on  [id number]   .  WOUND CARE - Keep the wound area clean and dry.  Do not get this area wet for one week. No showers for one week; you may shower on 2/19    . - The tape/steri-strips on your wound will fall off; do not pull them off.  No bandage is needed on the site.  DO  NOT apply any creams, oils, or ointments to the wound area. - If you notice any drainage or discharge from the wound, any swelling or bruising at the site, or you develop a fever > 101? F after you are discharged home, call the office at once.  Special Instructions - You are still able to use cellular telephones; use the ear opposite the side where you have your pacemaker/defibrillator.  Avoid carrying your cellular phone near your device. - When traveling through airports, show security personnel your identification card to avoid being screened in the metal detectors.  Ask the security personnel to use the hand wand. - Avoid arc welding equipment, MRI testing (magnetic resonance imaging), TENS units (transcutaneous nerve stimulators).  Call the office for questions about other devices. - Avoid electrical appliances that are in poor condition or are not properly grounded. - Microwave ovens are safe to be near or to operate.

## 2014-10-19 NOTE — Progress Notes (Signed)
Utilization Review Completed.Donne Anon T2/08/2015

## 2014-10-19 NOTE — Discharge Summary (Signed)
ELECTROPHYSIOLOGY PROCEDURE DISCHARGE SUMMARY    Patient ID: [name],  MRN: [id number], DOB/AGE: 10/02/1955 59 y.o.  Admit date: 10/18/2014 Discharge date: 10/19/2014  Primary Care Physician: [name], MD Primary Cardiologist: [place] Electrophysiologist: Caryl Comes  Primary Discharge Diagnosis:  Fractured RV ICD lead status post RV lead extraction, implantation of new RV lead, and ICD generator change this admission  Secondary Discharge Diagnosis:  1.  Breast cancer with subsequent adriamycin cardiomyopathy 2.  Obesity 3.  NSVT  No Known Allergies   Procedures This Admission:  1.  Extraction of previously implanted 6949 lead with placement of new 6935 RV lead and MDT CRTD generator change on 10-18-14 by Dr Lovena Le. See op note for full details.  There were no immediate post procedure complications. 2.  CXR on 10-19-14 demonstrated no pneumothorax status post device implantation.   Brief HPI: NASRIN [name] is a 59 y.o. female with an adriamycin induced cardiomyopathy and congestive heart failure. She underwent CRTD implant and had subsequent fracture of her RV lead.  She was referred to Dr Lovena Le for treatment options.  Risks, benefits, and alternatives to RV lead extraction and re-implantation were reviewed with the patient who wished to proceed.   Hospital Course:  The patient was admitted and underwent extraction and re-implantation of RV lead with CRTD gen change with details as outlined above.   She was monitored on telemetry overnight which demonstrated sinus rhythm with short run of AT.  Right chest was without hematoma or ecchymosis.  The device was interrogated and found to be functioning normally.  CXR was obtained and demonstrated no pneumothorax status post device implantation.  Wound care, arm mobility, and restrictions were reviewed with the patient.  Dr Lovena Le examined the patient and considered them stable for discharge to home.   The  patient's discharge medications include an ARB (Losartan) and beta blocker (Carvedilol).   Discharge Vitals: Blood pressure 121/66, pulse 83, temperature 97.3 F (36.3 C), temperature source Oral, resp. rate 15, height 5\' 1"  (1.549 m), weight 209 lb (94.802 kg), SpO2 100 %.   Labs:   Lab Results  Component Value Date   WBC 10.0 10/15/2014   HGB 12.7 10/15/2014   HCT 39.6 10/15/2014   MCV 96.1 10/15/2014   PLT 261 10/15/2014    Recent Labs Lab 10/15/14 0926  NA 137  K 3.5  CL 99  CO2 29  BUN 16  CREATININE 0.97  CALCIUM 9.6  GLUCOSE 131*     Discharge Medications:    Medication List    ASK your doctor about these medications        allopurinol 300 MG tablet  Commonly known as:  ZYLOPRIM  Take 300 mg by mouth daily.     azithromycin 250 MG tablet  Commonly known as:  ZITHROMAX  Take 500 mg by mouth daily.     carvedilol 12.5 MG tablet  Commonly known as:  COREG  Take 1 tablet (12.5 mg total) by mouth 2 (two) times daily with a meal.     citalopram 40 MG tablet  Commonly known as:  CELEXA  Take 40 mg by mouth daily.     digoxin 0.125 MG tablet  Commonly known as:  LANOXIN  Take 0.125 mg by mouth daily.     fluticasone 50 MCG/ACT nasal spray  Commonly known as:  FLONASE  Place 1 spray into both nostrils daily as needed for allergies or rhinitis.     furosemide 40 MG tablet  Commonly  known as:  LASIX  Take 40-60 mg by mouth 2 (two) times daily. 60 mg in the morning. 40 mg in the evening     furosemide 80 MG tablet  Commonly known as:  LASIX  Take 1 tablet (80 mg total) by mouth 2 (two) times daily.     losartan 50 MG tablet  Commonly known as:  COZAAR  Take 1 tablet (50 mg total) by mouth daily.     metolazone 2.5 MG tablet  Commonly known as:  ZAROXOLYN  Take 1 tablet (2.5 mg total) by mouth as needed. For weight gain of 3 lbs overnight or in 1 week     multivitamin-iron-minerals-folic acid Tabs tablet  Take 1 tablet by mouth daily.      omeprazole 20 MG capsule  Commonly known as:  PRILOSEC  Take 20 mg by mouth as needed (acid reflux).     potassium chloride SA 20 MEQ tablet  Commonly known as:  K-DUR,KLOR-CON  Take 0.5 tablets (10 mEq total) by mouth daily.     simvastatin 40 MG tablet  Commonly known as:  ZOCOR  Take 1 tablet (40 mg total) by mouth every evening.     spironolactone 25 MG tablet  Commonly known as:  ALDACTONE  Take 1 tablet (25 mg total) by mouth daily.     zolpidem 10 MG tablet  Commonly known as:  AMBIEN  Take 5 mg by mouth at bedtime as needed. For sleep        Disposition:     Duration of Discharge Encounter: less than 30 minutes including physician time.  Signed,  Mikle Bosworth.D.

## 2014-10-22 LAB — TYPE AND SCREEN
ABO/RH(D): A NEG
ANTIBODY SCREEN: NEGATIVE
UNIT DIVISION: 0
Unit division: 0

## 2014-10-23 ENCOUNTER — Encounter (HOSPITAL_COMMUNITY): Payer: Self-pay | Admitting: Internal Medicine

## 2014-10-25 ENCOUNTER — Encounter (HOSPITAL_COMMUNITY): Payer: Self-pay | Admitting: Internal Medicine

## 2014-10-26 NOTE — H&P (Signed)
  ICD Criteria  Current LVEF:55% ;Obtained > 3 months ago and < or = 6 months ago.   NYHA Functional Classification: Class II  Heart Failure History:  Yes, Duration of heart failure since onset is > 9 months  Non-Ischemic Dilated Cardiomyopathy History:  Yes, timeframe is > 9 months  Atrial Fibrillation/Atrial Flutter:  No.  Ventricular Tachycardia History:  No.  Cardiac Arrest History:  No  History of Syndromes with Risk of Sudden Death:  No.  Previous ICD:  Yes, ICD Type:  CRT-D, Reason for ICD:  Primary prevention.  30%  Electrophysiology Study: No.  Prior MI: No.  PPM: No.  OSA:  No  Patient Life Expectancy of >=1 year: Yes.  Anticoagulation Therapy:  Patient is NOT on anticoagulation therapy.   Beta Blocker Therapy:  Yes.   Ace Inhibitor/ARB Therapy:  Yes.

## 2014-10-31 ENCOUNTER — Ambulatory Visit (INDEPENDENT_AMBULATORY_CARE_PROVIDER_SITE_OTHER): Payer: Medicare PPO | Admitting: *Deleted

## 2014-10-31 ENCOUNTER — Telehealth: Payer: Self-pay | Admitting: *Deleted

## 2014-10-31 DIAGNOSIS — I5022 Chronic systolic (congestive) heart failure: Secondary | ICD-10-CM | POA: Diagnosis not present

## 2014-10-31 LAB — MDC_IDC_ENUM_SESS_TYPE_INCLINIC
Brady Statistic AP VP Percent: 0.65 %
Brady Statistic AS VP Percent: 99.24 %
Brady Statistic RA Percent Paced: 0.66 %
HIGH POWER IMPEDANCE MEASURED VALUE: 304 Ohm
HighPow Impedance: 63 Ohm
Lead Channel Impedance Value: 209 Ohm
Lead Channel Impedance Value: 323 Ohm
Lead Channel Impedance Value: 456 Ohm
Lead Channel Impedance Value: 532 Ohm
Lead Channel Impedance Value: 627 Ohm
Lead Channel Pacing Threshold Amplitude: 0.75 V
Lead Channel Pacing Threshold Pulse Width: 0.4 ms
Lead Channel Pacing Threshold Pulse Width: 0.4 ms
Lead Channel Sensing Intrinsic Amplitude: 16.5 mV
Lead Channel Sensing Intrinsic Amplitude: 2 mV
Lead Channel Sensing Intrinsic Amplitude: 2.625 mV
Lead Channel Setting Pacing Amplitude: 1.5 V
Lead Channel Setting Pacing Pulse Width: 0.4 ms
Lead Channel Setting Sensing Sensitivity: 0.3 mV
MDC IDC MSMT BATTERY REMAINING LONGEVITY: 95 mo
MDC IDC MSMT BATTERY VOLTAGE: 3.06 V
MDC IDC MSMT LEADCHNL RA PACING THRESHOLD AMPLITUDE: 0.875 V
MDC IDC MSMT LEADCHNL RV PACING THRESHOLD AMPLITUDE: 1.375 V
MDC IDC MSMT LEADCHNL RV PACING THRESHOLD PULSEWIDTH: 0.4 ms
MDC IDC MSMT LEADCHNL RV SENSING INTR AMPL: 17.625 mV
MDC IDC SESS DTM: 20160224094319
MDC IDC SET LEADCHNL LV PACING AMPLITUDE: 2 V
MDC IDC SET LEADCHNL RV PACING AMPLITUDE: 3.5 V
MDC IDC SET LEADCHNL RV PACING PULSEWIDTH: 0.6 ms
MDC IDC SET ZONE DETECTION INTERVAL: 350 ms
MDC IDC SET ZONE DETECTION INTERVAL: 450 ms
MDC IDC STAT BRADY AP VS PERCENT: 0.01 %
MDC IDC STAT BRADY AS VS PERCENT: 0.09 %
MDC IDC STAT BRADY RV PERCENT PACED: 99.78 %
Zone Setting Detection Interval: 300 ms
Zone Setting Detection Interval: 340 ms

## 2014-10-31 NOTE — Telephone Encounter (Signed)
Spoke with Dr. Lovena Le re: patient c/o low b/p and occasional dizziness.  Per Dr. Lovena Le patient may increase salt intake slightly and call the office for any continuing issues.  Patient aware.  Follow up as scheduled.

## 2014-10-31 NOTE — Progress Notes (Signed)
Wound check appointment. Steri-strips removed. Wound without redness or edema. Incision edges approximated, wound well healed. Normal device function. Thresholds, sensing, and impedances consistent with implant measurements. Device programmed at 3.5V for extra safety margin until 3 month visit. Histogram distribution appropriate for patient and level of activity. No mode switches.  1NSVT episode 1 second.   Patient educated about wound care, arm mobility, lifting restrictions, shock plan. ROV in 3 months with implanting physician.  Patient c/o low b/p readings in the 80 systolic range with some dizziness noted since implant.  B/P today 107/67.  The patient was advised to continue her medications as ordered unless we notify her and I will discuss this with Dr. Lovena Le later today.  LV and RV leads reversed in the header.

## 2014-11-01 ENCOUNTER — Telehealth: Payer: Self-pay | Admitting: Internal Medicine

## 2014-11-01 NOTE — Telephone Encounter (Signed)
New Msg      1. What dental office are you calling from? Pt calling   2. What is your office phone and fax number? Dr. Selinda Michaels office at [phone]   3. What type of procedure is the patient having performed? Routine Cleaning  4. What date is procedure scheduled? 11/05/14 at 9:45 am  5. What is your question (ex. Antibiotics prior to procedure, holding medication-we need to know how long dentist wants pt to hold med)? Is it too soon to have a cleaning?   Please call pt and advise.

## 2014-11-02 ENCOUNTER — Encounter: Payer: Self-pay | Admitting: Internal Medicine

## 2014-11-02 NOTE — Telephone Encounter (Signed)
Left detailed message on personal cell voicemail informing her to wait a couple months post surgery for routine cleaning. Advised her to call if she had further questions.

## 2014-12-13 ENCOUNTER — Telehealth (HOSPITAL_COMMUNITY): Payer: Self-pay

## 2014-12-13 NOTE — Telephone Encounter (Signed)
Patient called requesting to send in transmission for her medtronic ICD, states she has not done one since she got it about a month ago, but otherwise feels fine.  Advised that this is ok and we will look for the report in the computer once she completes transmission.

## 2014-12-18 ENCOUNTER — Telehealth (HOSPITAL_COMMUNITY): Payer: Self-pay | Admitting: Cardiology

## 2014-12-18 ENCOUNTER — Telehealth: Payer: Self-pay | Admitting: Cardiology

## 2014-12-18 NOTE — Telephone Encounter (Signed)
Pt called and wanted to know if transmission was received. After updating pt profile in carelink December 13, 2014 transmission was received. Pt wanted to know what her optivol looked like. Had a device tech look at that optivol reading. Informed pt that since she seen Dr. Haroldine Laws at the HF clinic she should call him about further advice about this matter. Pt verbalized understating.

## 2014-12-18 NOTE — Telephone Encounter (Signed)
Pt called with a request for ICD transmission results  Advised pt she should follow up with [facility] EP MD for results Routine transmission would go to that office   Pt aware and voiced understanding

## 2014-12-19 ENCOUNTER — Telehealth (HOSPITAL_COMMUNITY): Payer: Self-pay | Admitting: *Deleted

## 2014-12-19 NOTE — Telephone Encounter (Signed)
Pulled up pt's optivol report, it shows only slight elevation, spoke w/pt she states her husband had been in the hospital for 21 days and she had noticed some SOB and edema but since he has been home she has improved her diet and feels better now, will continue to monitor, sch f/u appt for 5/4

## 2015-01-07 ENCOUNTER — Encounter: Payer: Self-pay | Admitting: Internal Medicine

## 2015-01-10 ENCOUNTER — Encounter (HOSPITAL_COMMUNITY): Payer: Self-pay

## 2015-01-10 ENCOUNTER — Ambulatory Visit (HOSPITAL_COMMUNITY)
Admission: RE | Admit: 2015-01-10 | Discharge: 2015-01-10 | Disposition: A | Discharge status: Home / Self Care | Payer: Medicare PPO | Source: Ambulatory Visit | Attending: Internal Medicine | Admitting: Internal Medicine

## 2015-01-10 ENCOUNTER — Other Ambulatory Visit (HOSPITAL_COMMUNITY): Payer: Self-pay | Admitting: Internal Medicine

## 2015-01-10 VITALS — BP 120/68 | HR 88 | Wt 201.8 lb

## 2015-01-10 DIAGNOSIS — I5022 Chronic systolic (congestive) heart failure: Secondary | ICD-10-CM | POA: Insufficient documentation

## 2015-01-10 DIAGNOSIS — I5032 Chronic diastolic (congestive) heart failure: Secondary | ICD-10-CM

## 2015-01-10 NOTE — Patient Instructions (Signed)
Your physician recommends that you schedule a follow-up appointment in: 6 months  

## 2015-01-10 NOTE — Progress Notes (Signed)
Patient ID: [name], female   DOB: October 09, 1955, 59 y.o.   MRN: [id number] PCP: Dr Raeanne Gathers  HPI: [name] is a 59 y/o woman with history of breast CA in 1998 treated with Adriamycin x 4 cycles. In 2007, diagnosed with CHF with EF 25-35%. She is s/p Medtronic BiV-ICD in 2008 with Sprint Fidelis lead. LV function has subsequently recovered  Had cath 12/10. Coronaries normal. Right atrial pressure mean of 7, RV pressure 28/6 . Pulmonary artery pressure was 23/9 with mean of 15.  Pulmonary capillary wedge pressure mean of 9. Fick cardiac output 3.9 L per minute.  Cardiac index 2.2 L per minute per meter squared.  Pulmonary vascular resistance was 1.5 Woods units.  Echo 5/11: EF 50-55%  12/15/11 ECHO EF 50%. RV normal. 12/16/11 ICD generator replaced due to EOL 04/2013 EF 50% 07/19/14 Echo EF 50% inferior HK GLS -17.8  Follow up:  Underwent ICD lead extraction and generator change in February. Overall doing fairly well. Still walking 2 miles per day. Weight down 10 pounds. Edema ok now. BP ok. Continues with exertional dyspnea with moderate activity.  Takes metolazone every Wednesday. Husband recently very sick on ventilator due to flu. Now better.    Optivol: ICD was checked today. Recent increase over threshold but now back to baseline. Activity level 4-6 hours per day. No AF/VT. 100% biv pacing  ROS: All systems negative except as listed in HPI, PMH and Problem List.  Past Medical History  Diagnosis Date  . Breast cancer     s/p mastectomy and chemo with adriamycin (1998)  . Obesity   . NSVT (nonsustained ventricular tachycardia)   . Biventricular implantable cardiac defibrillator)     Medtronic  . Nonischemic cardiomyopathy     EF 55% echo 2012  . sprint Fidelis   . Gout     takes Allopurinol daily  . GERD (gastroesophageal reflux disease)     takes Omeprazole daily  . Insomnia     takes Ambien nightly  . CHF (congestive heart failure)     takes Lasix and Aldactone daily  .  Hyperlipidemia     takes Simvastatin daily  . Presence of permanent cardiac pacemaker   . AICD (automatic cardioverter/defibrillator) present   . Shortness of breath dyspnea     with exertion  . Pneumonia 56yrs ago  . History of bronchitis 2 yrs ago  . History of shingles     Current Outpatient Prescriptions  Medication Sig Dispense Refill  . allopurinol (ZYLOPRIM) 300 MG tablet Take 300 mg by mouth daily.      . carvedilol (COREG) 12.5 MG tablet Take 1 tablet (12.5 mg total) by mouth 2 (two) times daily with a meal. 180 tablet 3  . citalopram (CELEXA) 40 MG tablet Take 40 mg by mouth daily.      . fluticasone (FLONASE) 50 MCG/ACT nasal spray Place 1 spray into both nostrils daily as needed for allergies or rhinitis.    . furosemide (LASIX) 80 MG tablet Take 80 mg by mouth 2 (two) times daily.    Marland Kitchen losartan (COZAAR) 50 MG tablet Take 1 tablet (50 mg total) by mouth daily. 90 tablet 3  . metolazone (ZAROXOLYN) 2.5 MG tablet Take 1 tablet (2.5 mg total) by mouth as needed. For weight gain of 3 lbs overnight or in 1 week (Patient taking differently: Take 2.5 mg by mouth every Wednesday. ) 30 tablet 3  . multivitamin-iron-minerals-folic acid (THERAPEUTIC-M) TABS tablet Take 1 tablet by mouth daily.    Marland Kitchen  omeprazole (PRILOSEC) 20 MG capsule Take 20 mg by mouth as needed (acid reflux).     . potassium chloride SA (K-DUR,KLOR-CON) 20 MEQ tablet Take 0.5 tablets (10 mEq total) by mouth daily. 45 tablet 3  . simvastatin (ZOCOR) 40 MG tablet Take 1 tablet (40 mg total) by mouth every evening. 90 tablet 6  . spironolactone (ALDACTONE) 25 MG tablet Take 1 tablet (25 mg total) by mouth daily. 90 tablet 3  . zolpidem (AMBIEN) 10 MG tablet Take 5 mg by mouth at bedtime as needed. For sleep     No current facility-administered medications for this encounter.   PHYSICAL EXAM: Filed Vitals:   01/10/15 1044  BP: 120/68  Pulse: 99  Weight: 201 lb 12.8 oz (91.536 kg)  SpO2: 97%   General:  Well  appearing. No resp difficulty HEENT: normal Neck: supple. JVP  5-6 Carotids 2+ bilaterally; no bruits. No lymphadenopathy or thryomegaly appreciated. Cor: PMI normal. Regular rate & rhythm. No rubs, gallops or murmurs. ICD wound well healed  Lungs: clear Abdomen: soft, nontender, nondistended. No hepatosplenomegaly. No bruits or masses. Good bowel sounds. Extremities: no cyanosis, clubbing, rash, no edema Neuro: alert & orientedx3, cranial nerves grossly intact. Moves all 4 extremities w/o difficulty. Affect pleasant.  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 - Doing very well with stable NYHA II. Volume status good on exam and Optivol. I think metolazone regimen is working well for her.  - ICD/Optivol interrogated in Clinic  - Continue current Coreg, losartan, and spironolactone.  BP has been generally too soft to handle titration (SBP runs in 90s at home) - Will see back in 4-6 months 2) Medtronic BiV ICD: s/p change. out  [place], [place] 01/10/2015 11:06 AM

## 2015-01-21 ENCOUNTER — Encounter: Payer: Self-pay | Admitting: Internal Medicine

## 2015-01-25 ENCOUNTER — Encounter: Payer: Self-pay | Admitting: Internal Medicine

## 2015-01-25 ENCOUNTER — Ambulatory Visit (INDEPENDENT_AMBULATORY_CARE_PROVIDER_SITE_OTHER): Payer: Medicare PPO | Admitting: Internal Medicine

## 2015-01-25 VITALS — BP 100/68 | HR 73 | Ht 61.0 in | Wt 209.6 lb

## 2015-01-25 DIAGNOSIS — I472 Ventricular tachycardia: Secondary | ICD-10-CM

## 2015-01-25 DIAGNOSIS — I428 Other cardiomyopathies: Secondary | ICD-10-CM

## 2015-01-25 DIAGNOSIS — I4729 Other ventricular tachycardia: Secondary | ICD-10-CM

## 2015-01-25 DIAGNOSIS — Z4502 Encounter for adjustment and management of automatic implantable cardiac defibrillator: Secondary | ICD-10-CM

## 2015-01-25 DIAGNOSIS — I429 Cardiomyopathy, unspecified: Secondary | ICD-10-CM

## 2015-01-25 DIAGNOSIS — I5022 Chronic systolic (congestive) heart failure: Secondary | ICD-10-CM | POA: Diagnosis not present

## 2015-01-25 LAB — CUP PACEART INCLINIC DEVICE CHECK
Battery Remaining Longevity: 103 mo
Battery Voltage: 3.05 V
Brady Statistic AP VP Percent: 2.71 %
Brady Statistic AP VS Percent: 0.01 %
Brady Statistic AS VS Percent: 0.06 %
Brady Statistic RV Percent Paced: 99.76 %
Date Time Interrogation Session: 20160520131946
HighPow Impedance: 266 Ohm
HighPow Impedance: 58 Ohm
Lead Channel Impedance Value: 342 Ohm
Lead Channel Impedance Value: 589 Ohm
Lead Channel Impedance Value: 627 Ohm
Lead Channel Pacing Threshold Amplitude: 1 V
Lead Channel Pacing Threshold Amplitude: 1.25 V
Lead Channel Pacing Threshold Pulse Width: 0.4 ms
Lead Channel Sensing Intrinsic Amplitude: 15.875 mV
Lead Channel Setting Pacing Amplitude: 1.5 V
Lead Channel Setting Pacing Amplitude: 1.5 V
Lead Channel Setting Pacing Amplitude: 2 V
Lead Channel Setting Pacing Pulse Width: 0.4 ms
Lead Channel Setting Pacing Pulse Width: 0.4 ms
MDC IDC MSMT LEADCHNL LV IMPEDANCE VALUE: 247 Ohm
MDC IDC MSMT LEADCHNL LV IMPEDANCE VALUE: 760 Ohm
MDC IDC MSMT LEADCHNL LV PACING THRESHOLD AMPLITUDE: 0.5 V
MDC IDC MSMT LEADCHNL LV PACING THRESHOLD PULSEWIDTH: 0.4 ms
MDC IDC MSMT LEADCHNL RA PACING THRESHOLD PULSEWIDTH: 0.4 ms
MDC IDC MSMT LEADCHNL RA SENSING INTR AMPL: 1.875 mV
MDC IDC SET LEADCHNL RV SENSING SENSITIVITY: 0.3 mV
MDC IDC SET ZONE DETECTION INTERVAL: 300 ms
MDC IDC SET ZONE DETECTION INTERVAL: 450 ms
MDC IDC STAT BRADY AS VP PERCENT: 97.22 %
MDC IDC STAT BRADY RA PERCENT PACED: 2.72 %
Zone Setting Detection Interval: 340 ms
Zone Setting Detection Interval: 350 ms

## 2015-01-25 MED ORDER — TORSEMIDE 20 MG PO TABS: ORAL_TABLET | ORAL | Status: DC

## 2015-01-25 NOTE — Progress Notes (Signed)
Patient Care Team: [name], MD as PCP - General   HPI  [name] is a 59 y.o. female seen today in follow-up for congestive heart failure in the setting of nonischemic cardiomyopathy. She is status post CRTD implantation about 4 years ago.She has a sprint Fidelis lead she reached ERI in Aprill 2013  We reversed the rate sense portion of the RV and LV leads   In 2/16 she fractured her lead and underwent extraction and ICD lead reimplantation She has tolerated this without difficulty.  The patient denies chest pain, nocturnal dyspnea, orthopnea.    She's having some problems with peripheral edema as well as shortness of breath with exertion. She tells me the story of her husband about swine flu and was intubated for 10 days on dialysis and is now without any obvious residual.       She underwent catheterization 12/10 , coronaries were normal and filling pressures were normal  [facility] study in 2007 normal perfusion by report. Echo 5/12 EF remains in the 55-60% range  most recent echo 11/15 had an EF of 45%.   Medical history notable for breast CA in 1998 treated with Adriamycin x 4 cycles.       Past Medical History  Diagnosis Date  . Breast cancer     s/p mastectomy and chemo with adriamycin (1998)  . Obesity   . NSVT (nonsustained ventricular tachycardia)   . Biventricular implantable cardiac defibrillator)     Medtronic  . Nonischemic cardiomyopathy     EF 55% echo 2012  . sprint Fidelis   . Gout     takes Allopurinol daily  . GERD (gastroesophageal reflux disease)     takes Omeprazole daily  . Insomnia     takes Ambien nightly  . CHF (congestive heart failure)     takes Lasix and Aldactone daily  . Hyperlipidemia     takes Simvastatin daily  . Presence of permanent cardiac pacemaker   . AICD (automatic cardioverter/defibrillator) present   . Shortness of breath dyspnea     with exertion  . Pneumonia 15yrs ago  . History of bronchitis 2 yrs ago   . History of shingles     Past Surgical History  Procedure Laterality Date  . Biv icd  2007    Medtronic  . Biv icd genertaor change out N/A 12/16/2011    Procedure: BIV ICD GENERTAOR CHANGE OUT;  Surgeon: [name], MD;  Location: [facility] CATH LAB;  Service: Cardiovascular;  Laterality: N/A;  . Abdominal hysterectomy    . Appendectomy  1978  . Mastectomy Left 1998  . Cyst removed from wrist Left     early 70's  . Port a cath placed  1998  . Port removed  1998  . Colonoscopy    . Icd lead removal Right 10/18/2014    Procedure: ICD LEAD REMOVAL/EXTRACTION/REIMPLANTATION;  Surgeon: Evans Lance, MD;  Location: [facility] OR;  Service: Cardiovascular;  Laterality: Right;    Current Outpatient Prescriptions  Medication Sig Dispense Refill  . allopurinol (ZYLOPRIM) 300 MG tablet Take 300 mg by mouth daily.      . carvedilol (COREG) 12.5 MG tablet Take 1 tablet (12.5 mg total) by mouth 2 (two) times daily with a meal. 180 tablet 3  . citalopram (CELEXA) 40 MG tablet Take 40 mg by mouth daily.      . fluticasone (FLONASE) 50 MCG/ACT nasal spray Place 1 spray into both nostrils daily as needed for allergies or  rhinitis.    . furosemide (LASIX) 80 MG tablet Take 80 mg by mouth 2 (two) times daily.    Marland Kitchen losartan (COZAAR) 50 MG tablet Take 1 tablet (50 mg total) by mouth daily. 90 tablet 3  . metolazone (ZAROXOLYN) 2.5 MG tablet TAKE 1 TABLET DAILY AS NEEDED FOR WEIGHT GAIN OF 3 POUNDS OR MORE OVERNIGHT 90 tablet 3  . multivitamin-iron-minerals-folic acid (THERAPEUTIC-M) TABS tablet Take 1 tablet by mouth daily.    Marland Kitchen omeprazole (PRILOSEC) 20 MG capsule Take 20 mg by mouth as needed (acid reflux).     . potassium chloride SA (K-DUR,KLOR-CON) 20 MEQ tablet Take 0.5 tablets (10 mEq total) by mouth daily. 45 tablet 3  . simvastatin (ZOCOR) 40 MG tablet Take 1 tablet (40 mg total) by mouth every evening. 90 tablet 6  . spironolactone (ALDACTONE) 25 MG tablet Take 1 tablet (25 mg total) by mouth daily. 90  tablet 3  . zolpidem (AMBIEN) 10 MG tablet Take 5 mg by mouth at bedtime as needed. For sleep     No current facility-administered medications for this visit.    No Known Allergies  Review of Systems negative except from HPI and PMH  Physical Exam BP 100/68 mmHg  Pulse 73  Ht 5\' 1"  (1.549 m)  Wt 209 lb 9.6 oz (95.074 kg)  BMI 39.62 kg/m2 Well developed and nourished in no acute distress HENT normal Neck supple with JVP-flat Clear Regular rate and rhythm, no murmurs or gallops Abd-soft with active BS No Clubbing cyanosis 2+ edema Skin-warm and dry A & Oriented  Grossly normal sensory and motor function  ECG demonstrates P. Synchronous pacing with a biventricular configuration   Assessment and  Plan  Nonischemic cardiomyopathy  Congestive heart failure-chronic systolic grade 3  Defibrillator lead failure  Implantable defibrillator-Medtronic The patient's device was interrogated and the information was fully reviewed.  The device was reprogrammed to  decrease LV outputs has to preserve battery She is volume overloaded. I have elected to change her furosemide to torsemide and hopefully this will even out her diuresis so that she will not be when necessary metolazone. We'll bring her back in 3 weeks time for AV optimization echo to see if we can improve CRT-D function and at that visit we will check a metabolic profile.

## 2015-01-25 NOTE — Patient Instructions (Addendum)
Medication Instructions: - Stop lasix (furosemide) - Start torsemide 20 mg take two tablets (40 mg) by mouth twice daily  Labwork: - Your physician recommends that you return for lab work in: 3 weeks - BMP ( same day of AV opt echo )   Procedures/Testing: - Your physician has recommended that you have an AV optimization echo (Medtronic)- in 3 weeks. During this procedure, an echocardiogram is performed to optimize the timing of your device using ultrasound and a device programmer. Changes will be made to the device settings to help the heart chambers pump more efficiently. This procedure takes approximately one hour.  Follow-Up: - Remote monitoring is used to monitor your Pacemaker of ICD from home. This monitoring reduces the number of office visits required to check your device to one time per year. It allows [place] to keep an eye on the functioning of your device to ensure it is working properly. You are scheduled for a device check from home on 04/29/15. You may send your transmission at any time that day. If you have a wireless device, the transmission will be sent automatically. After your physician reviews your transmission, you will receive a postcard with your next transmission date.  - Your physician wants you to follow-up in: 9 months with Dr. Caryl Comes. You will receive a reminder letter in the mail two months in advance. If you don't receive a letter, please call our office to schedule the follow-up appointment.   Any Additional Special Instructions Will Be Listed Below (If Applicable).

## 2015-01-27 ENCOUNTER — Other Ambulatory Visit (HOSPITAL_COMMUNITY): Payer: Self-pay | Admitting: Internal Medicine

## 2015-02-11 ENCOUNTER — Other Ambulatory Visit: Payer: Self-pay

## 2015-02-11 ENCOUNTER — Ambulatory Visit (INDEPENDENT_AMBULATORY_CARE_PROVIDER_SITE_OTHER): Payer: Medicare PPO | Admitting: *Deleted

## 2015-02-11 ENCOUNTER — Other Ambulatory Visit (INDEPENDENT_AMBULATORY_CARE_PROVIDER_SITE_OTHER): Payer: Medicare PPO | Admitting: *Deleted

## 2015-02-11 ENCOUNTER — Ambulatory Visit (HOSPITAL_COMMUNITY): Payer: Medicare PPO | Attending: Cardiovascular Disease

## 2015-02-11 DIAGNOSIS — I5022 Chronic systolic (congestive) heart failure: Secondary | ICD-10-CM

## 2015-02-11 DIAGNOSIS — I429 Cardiomyopathy, unspecified: Secondary | ICD-10-CM | POA: Diagnosis not present

## 2015-02-11 DIAGNOSIS — I313 Pericardial effusion (noninflammatory): Secondary | ICD-10-CM | POA: Insufficient documentation

## 2015-02-11 DIAGNOSIS — I517 Cardiomegaly: Secondary | ICD-10-CM | POA: Diagnosis not present

## 2015-02-11 LAB — BASIC METABOLIC PANEL
BUN: 16 mg/dL (ref 6–23)
CO2: 36 mEq/L — ABNORMAL HIGH (ref 19–32)
Calcium: 10.1 mg/dL (ref 8.4–10.5)
Chloride: 96 mEq/L (ref 96–112)
Creatinine, Ser: 1.05 mg/dL (ref 0.40–1.20)
GFR: 56.62 mL/min — ABNORMAL LOW (ref >60.00)
GLUCOSE: 81 mg/dL (ref 70–99)
Potassium: 3.4 mEq/L — ABNORMAL LOW (ref 3.5–5.1)
SODIUM: 137 meq/L (ref 135–145)

## 2015-02-11 LAB — CUP PACEART INCLINIC DEVICE CHECK
Brady Statistic AS VP Percent: 95.96 %
Brady Statistic RV Percent Paced: 99.86 %
HighPow Impedance: 304 Ohm
HighPow Impedance: 73 Ohm
Lead Channel Impedance Value: 247 Ohm
Lead Channel Impedance Value: 342 Ohm
Lead Channel Impedance Value: 627 Ohm
Lead Channel Impedance Value: 646 Ohm
Lead Channel Impedance Value: 779 Ohm
Lead Channel Pacing Threshold Amplitude: 1.125 V
Lead Channel Pacing Threshold Amplitude: 1.375 V
Lead Channel Pacing Threshold Pulse Width: 0.4 ms
Lead Channel Pacing Threshold Pulse Width: 0.4 ms
Lead Channel Pacing Threshold Pulse Width: 0.4 ms
Lead Channel Sensing Intrinsic Amplitude: 17.125 mV
Lead Channel Sensing Intrinsic Amplitude: 17.25 mV
Lead Channel Sensing Intrinsic Amplitude: 2.625 mV
Lead Channel Setting Pacing Amplitude: 1.75 V
Lead Channel Setting Pacing Amplitude: 2.5 V
Lead Channel Setting Pacing Pulse Width: 0.4 ms
Lead Channel Setting Sensing Sensitivity: 0.3 mV
MDC IDC MSMT BATTERY REMAINING LONGEVITY: 98 mo
MDC IDC MSMT BATTERY VOLTAGE: 3.02 V
MDC IDC MSMT LEADCHNL LV PACING THRESHOLD AMPLITUDE: 0.625 V
MDC IDC MSMT LEADCHNL RA SENSING INTR AMPL: 2 mV
MDC IDC SESS DTM: 20160606152956
MDC IDC SET LEADCHNL LV PACING AMPLITUDE: 1.75 V
MDC IDC SET LEADCHNL LV PACING PULSEWIDTH: 0.4 ms
MDC IDC STAT BRADY AP VP PERCENT: 3.99 %
MDC IDC STAT BRADY AP VS PERCENT: 0.01 %
MDC IDC STAT BRADY AS VS PERCENT: 0.04 %
MDC IDC STAT BRADY RA PERCENT PACED: 4 %
Zone Setting Detection Interval: 300 ms
Zone Setting Detection Interval: 340 ms
Zone Setting Detection Interval: 350 ms
Zone Setting Detection Interval: 450 ms

## 2015-02-11 NOTE — Addendum Note (Signed)
Addended by: Eulis Foster on: 02/11/2015 11:54 AM   Modules accepted: Orders

## 2015-02-11 NOTE — Progress Notes (Signed)
AV optimization echo performed by industry. AS evaluated images for multiple AV delays. Paced AV delay set to 13ms, sensed AV set to 74ms per AS. [facility] on 04/29/2015, ROV with SK in 4-6 weeks as scheduled.

## 2015-02-18 ENCOUNTER — Telehealth: Payer: Self-pay | Admitting: Internal Medicine

## 2015-02-18 NOTE — Telephone Encounter (Signed)
Pt calling to inform Dr Caryl Comes and nurse that she got the information for them to send in needed labs from abnormal K too.  Pt states that Dr Caryl Comes has to fill an order out for a BMET and send this into pts requested lab [facility] [place] at [phone].  Informed the pt that Dr Caryl Comes and nurse are both out of the office today but will return tomorrow.  Informed the pt that I will send this message to the both of them to follow-up on this tomorrow and fax needed lab order, to location requested. Pt verbalized understanding, and agrees with this plan.

## 2015-02-18 NOTE — Telephone Encounter (Signed)
New Message      Pt was instructed to have blood work done and she wants to have it taken at her current hospital in her local town.  Name and Fax number:   [facility]    Can you fax documents to the lab [phone]

## 2015-02-19 ENCOUNTER — Other Ambulatory Visit (HOSPITAL_COMMUNITY): Payer: Self-pay | Admitting: Internal Medicine

## 2015-02-20 NOTE — Telephone Encounter (Signed)
Lab work is due week of 6/20

## 2015-02-21 NOTE — Telephone Encounter (Signed)
Informed patient order for BMET & HFP to be drawn week of 6/27 - 7/01. Patient is agreeable

## 2015-02-28 IMAGING — CR DG CHEST 2V
2 series · 2 of 2 positions shown · non-contrast
Comparison: PA and lateral chest of December 16, 2011

CLINICAL DATA: Implantable cardiac defibrillator lead
removal-extraction

EXAM:
CHEST  2 VIEW

[w chest pa]
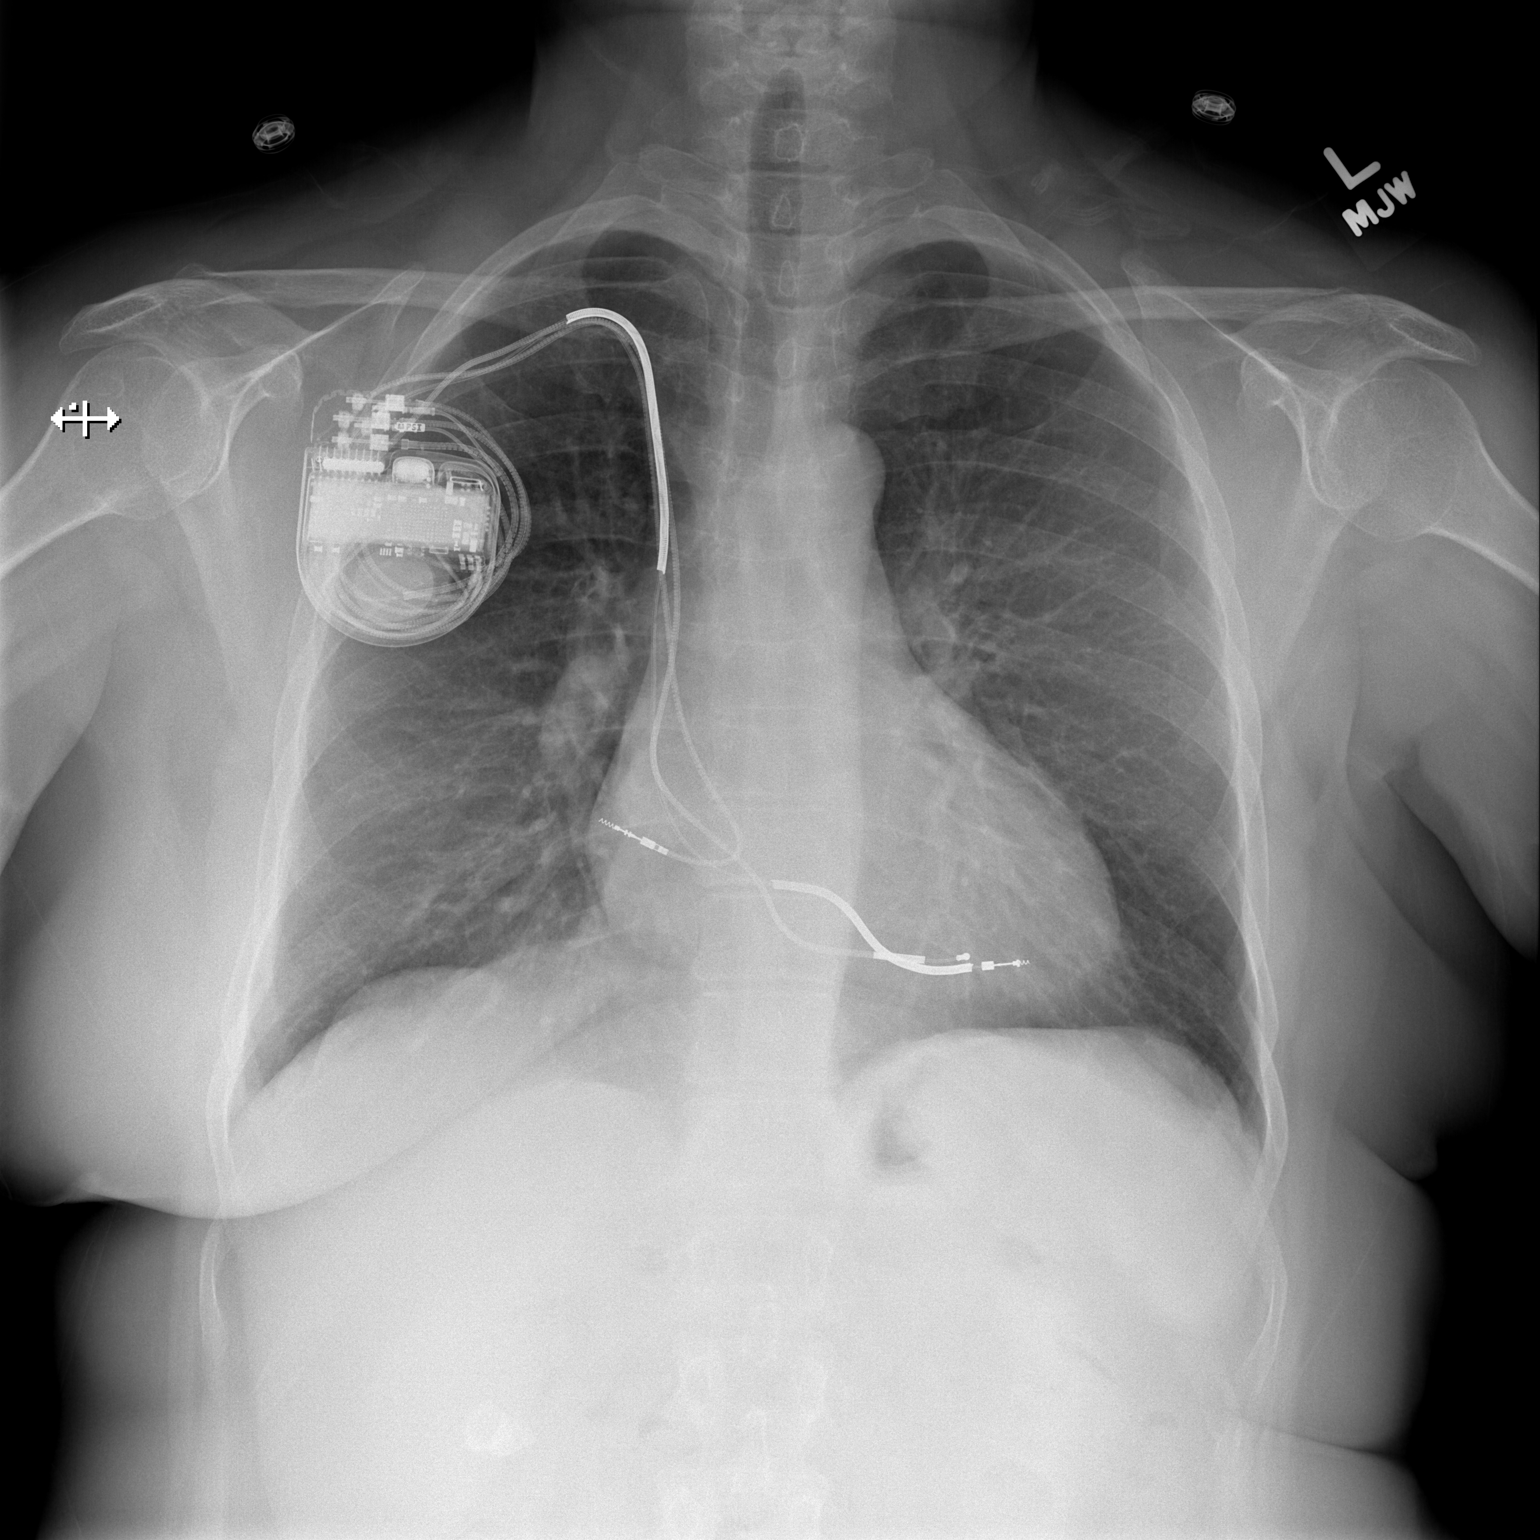

[w chest lat]
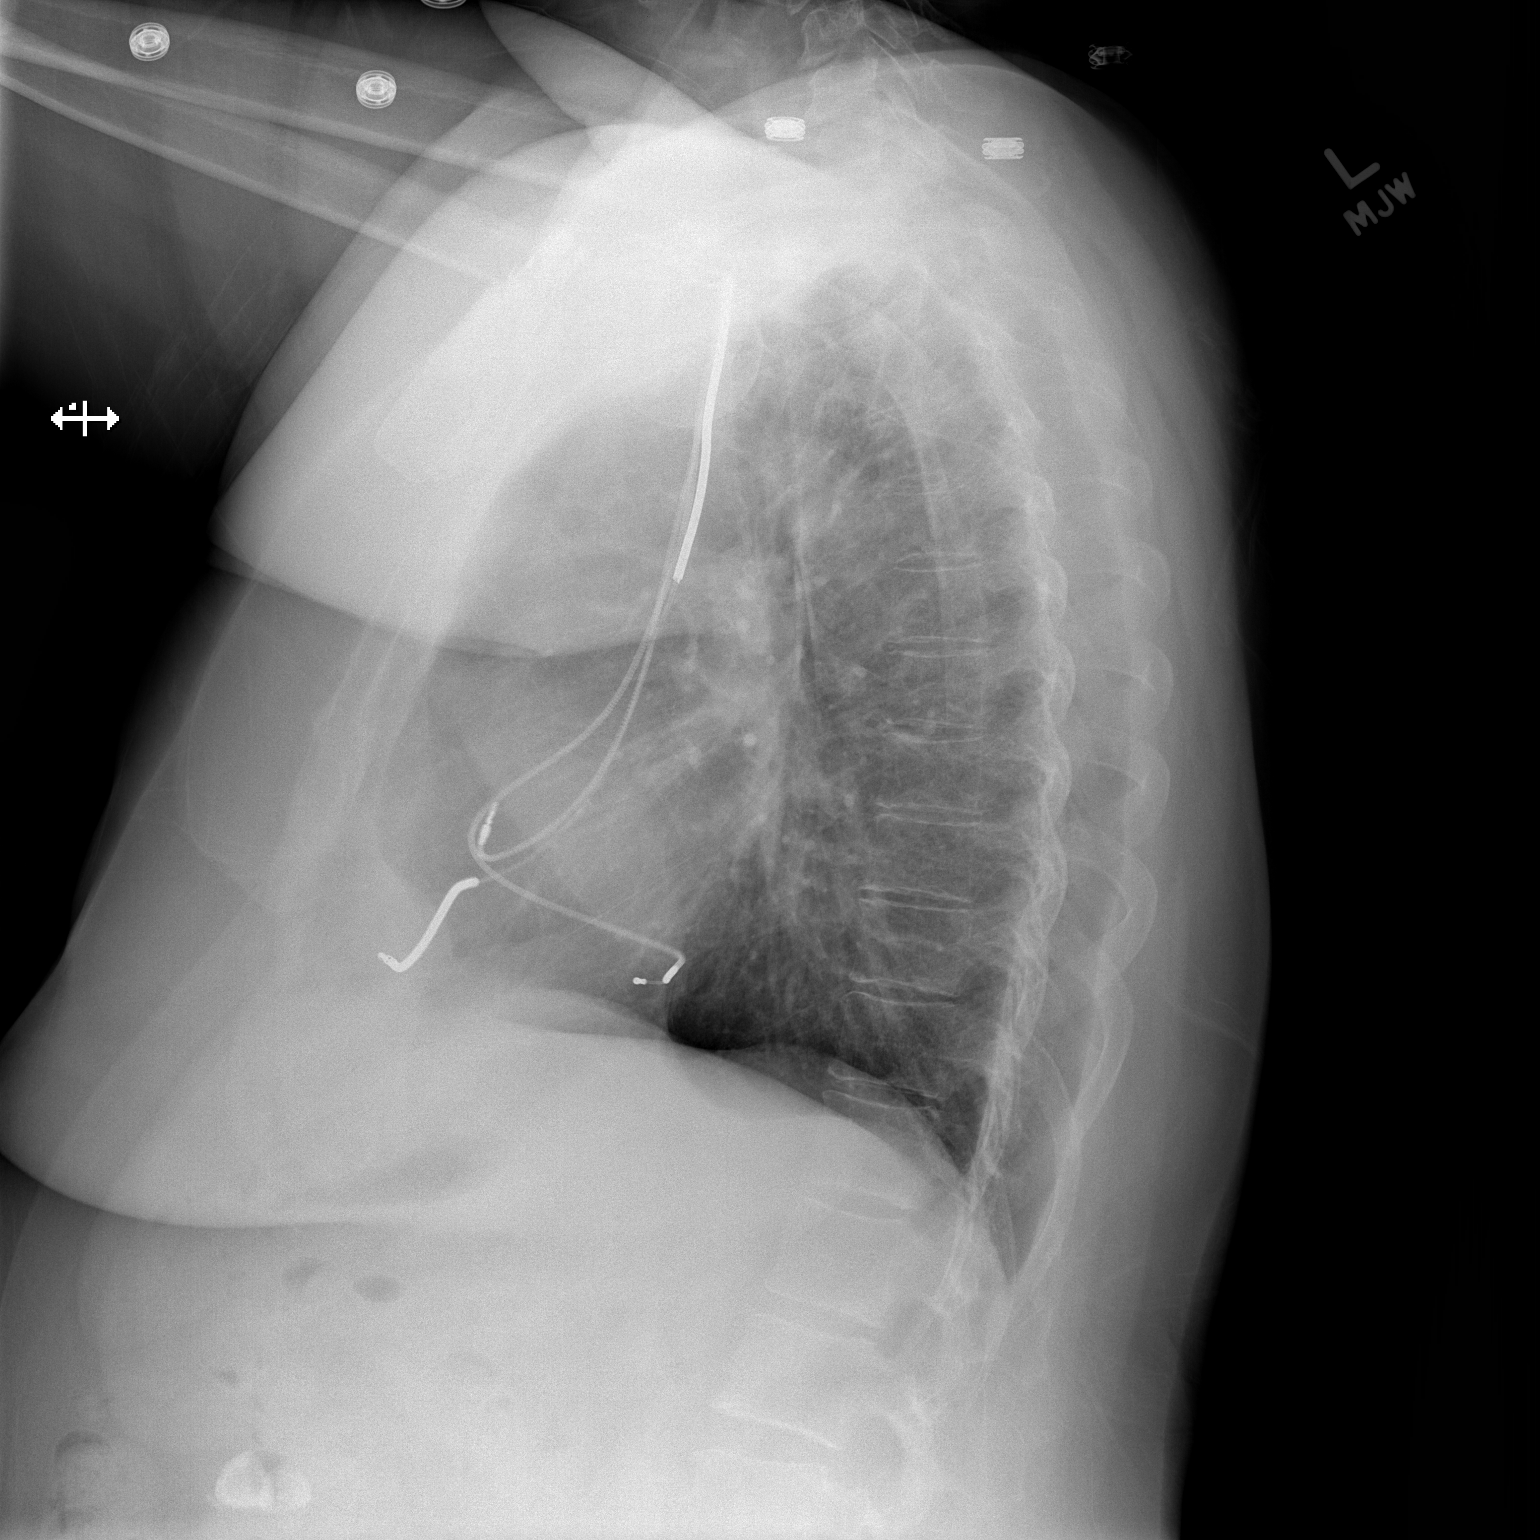

[2 of 2 positions shown; findings below may reference images not displayed]

FINDINGS: The lungs are well-expanded and clear. The heart and pulmonary
vascularity are normal. The mediastinum is normal in width. The
permanent pacemaker defibrillator leads are unchanged in position.
The generator projects over the right pectoral region. The bony
thorax exhibits no acute abnormality.
IMPRESSION: Stable appearance of the implanted pacemaker defibrillator. There is
no active cardiopulmonary disease.

## 2015-03-01 IMAGING — DX DG CHEST 2V
2 series · 2 of 2 positions shown · non-contrast
Comparison: 10/18/2014

CLINICAL DATA: Status post ICD implant

EXAM:
CHEST  2 VIEW

[chest pa]
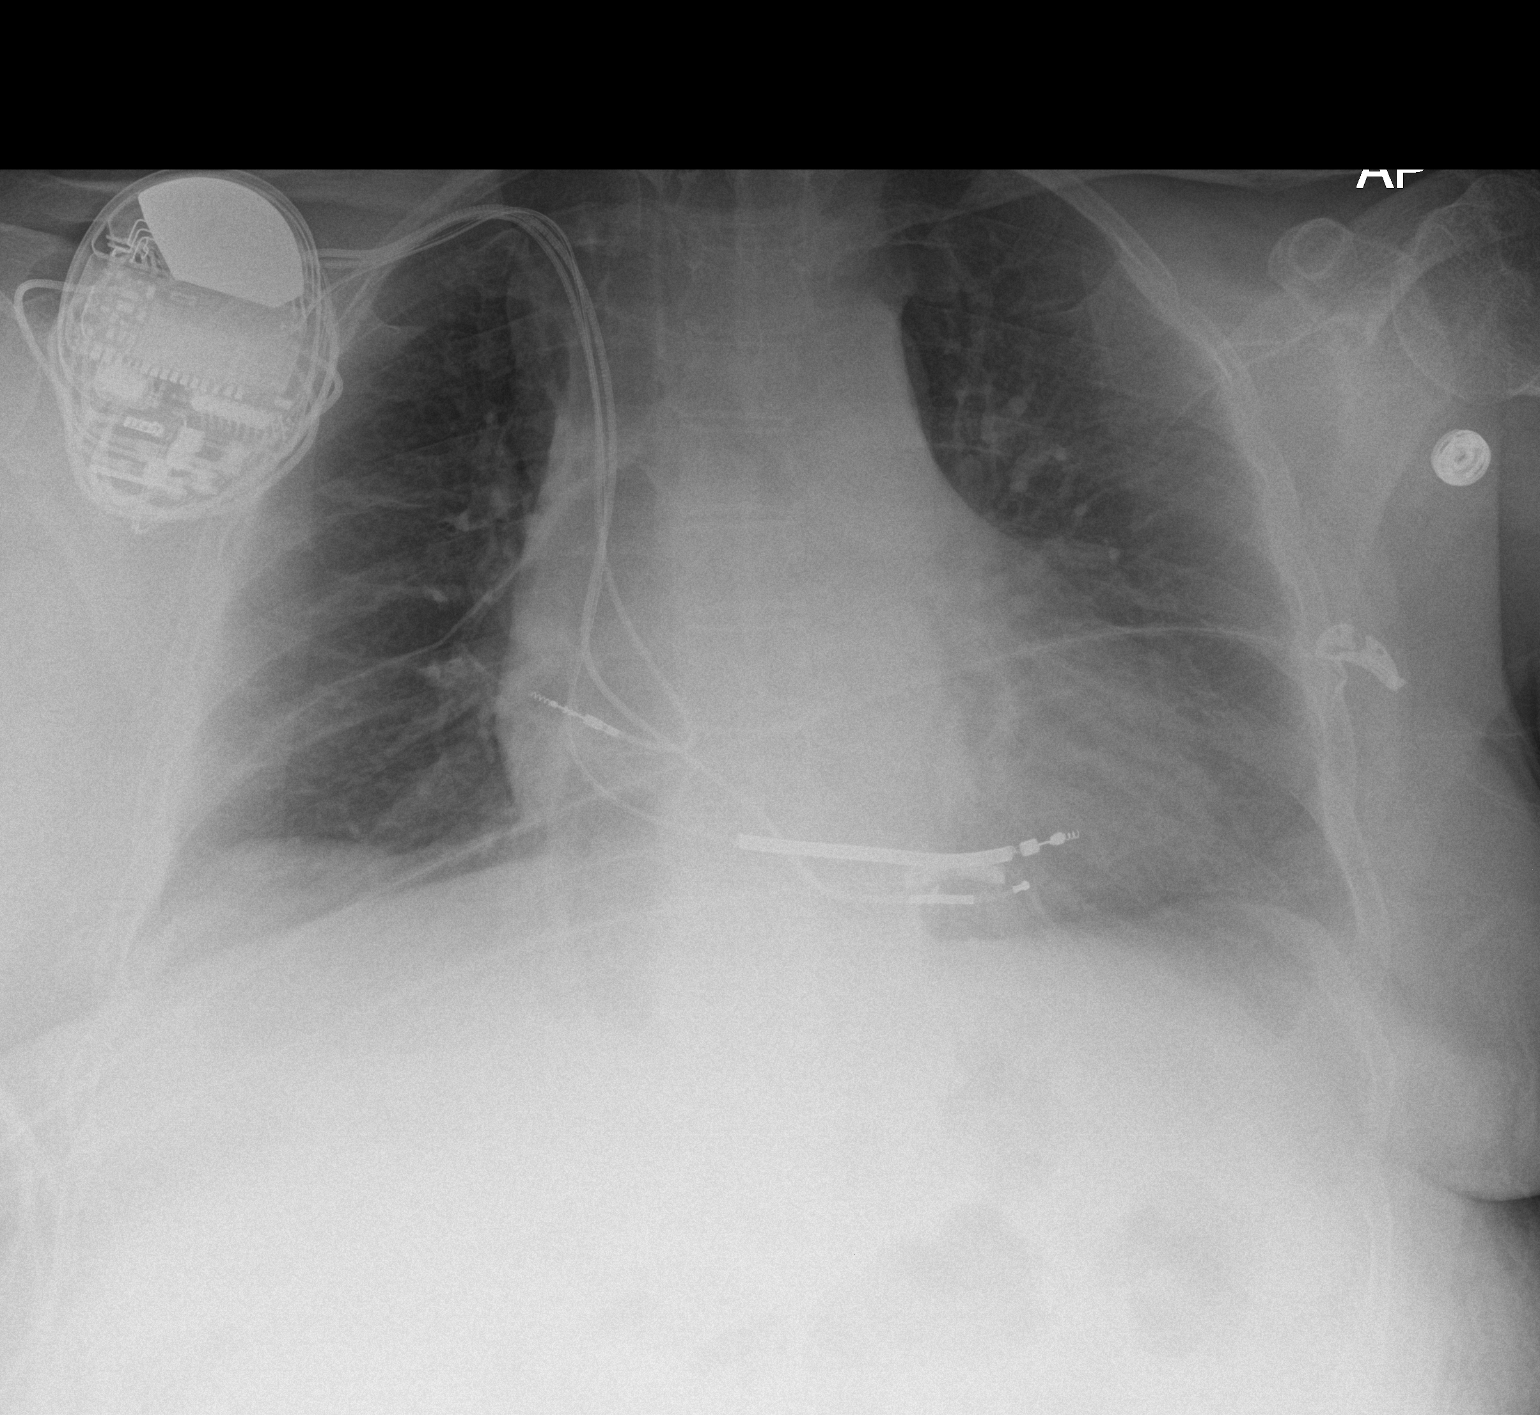

[chest lat]
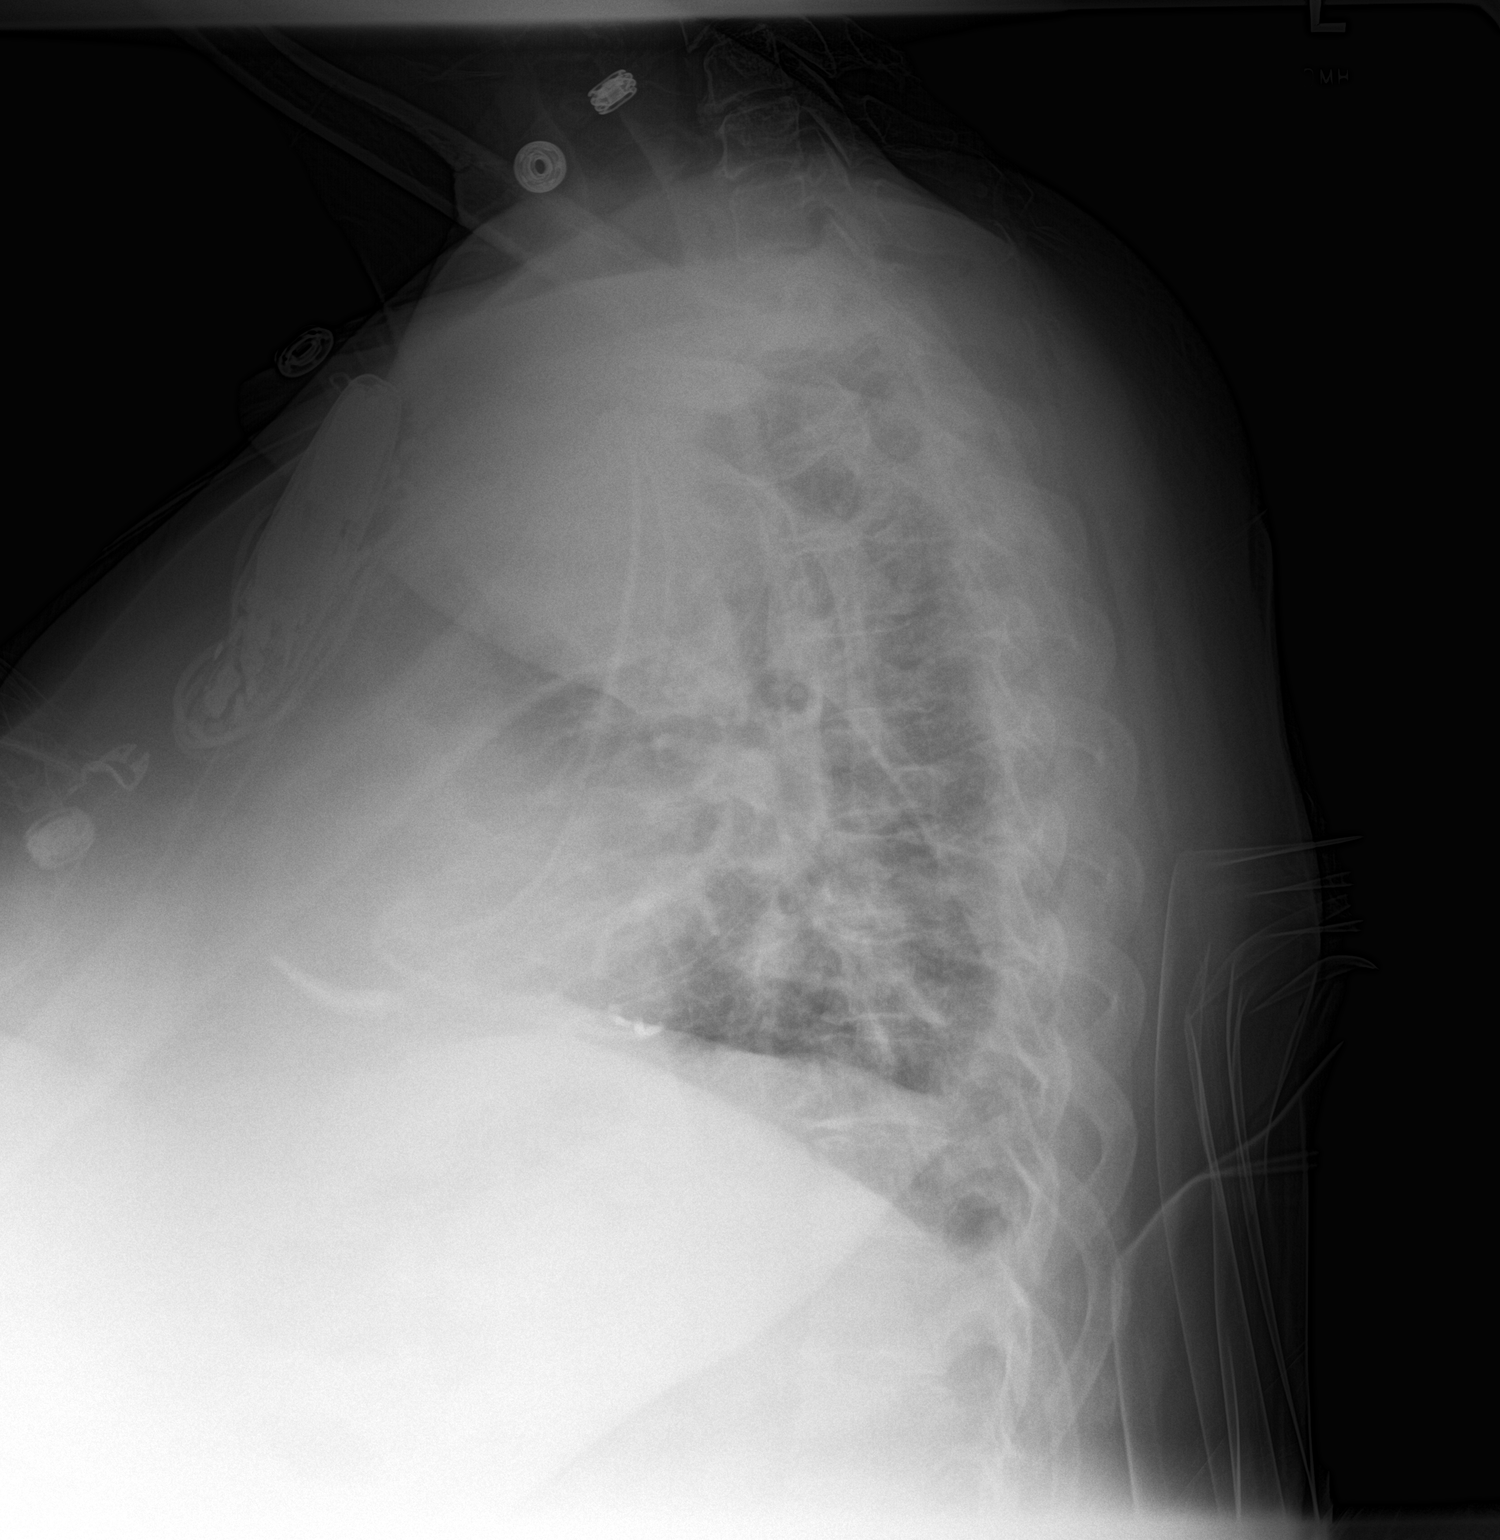

[2 of 2 positions shown; findings below may reference images not displayed]

FINDINGS: Unremarkable positioning of biventricular ICD/pacer leads. A battery
pack change is noted. Cardiomegaly is slightly increased, but this
could be explained by lower lung volumes. Tiny pleural effusions are
noted. No edema, pneumonia, or pneumothorax.
IMPRESSION: 1. Increased cardiac size which is likely from lower lung volumes.
2. Tiny pleural effusions.  No pneumothorax.

## 2015-03-04 ENCOUNTER — Telehealth: Payer: Self-pay | Admitting: Internal Medicine

## 2015-03-04 ENCOUNTER — Encounter: Payer: Self-pay | Admitting: Internal Medicine

## 2015-03-04 NOTE — Telephone Encounter (Signed)
New message      Pt having labs today at office, needs diagnosis code.  Please call to advise

## 2015-03-04 NOTE — Telephone Encounter (Signed)
Called and gave Leafy Ro at [facility]. Informed her that patient had a low Potassium and had elevated liver panel and recent cholecystectomy. Mandy verbalized understanding.

## 2015-03-06 ENCOUNTER — Encounter: Payer: Self-pay | Admitting: Internal Medicine

## 2015-03-15 ENCOUNTER — Encounter: Payer: Medicare PPO | Admitting: Internal Medicine

## 2015-03-27 ENCOUNTER — Encounter: Payer: Self-pay | Admitting: Internal Medicine

## 2015-03-27 ENCOUNTER — Ambulatory Visit (INDEPENDENT_AMBULATORY_CARE_PROVIDER_SITE_OTHER): Payer: Medicare PPO | Admitting: Internal Medicine

## 2015-03-27 VITALS — BP 92/56 | HR 97 | Ht 61.0 in | Wt 207.0 lb

## 2015-03-27 DIAGNOSIS — Z95810 Presence of automatic (implantable) cardiac defibrillator: Secondary | ICD-10-CM

## 2015-03-27 DIAGNOSIS — Z4502 Encounter for adjustment and management of automatic implantable cardiac defibrillator: Secondary | ICD-10-CM | POA: Diagnosis not present

## 2015-03-27 DIAGNOSIS — I5022 Chronic systolic (congestive) heart failure: Secondary | ICD-10-CM | POA: Diagnosis not present

## 2015-03-27 LAB — CUP PACEART INCLINIC DEVICE CHECK
Battery Remaining Longevity: 100 mo
Battery Voltage: 3.02 V
Brady Statistic AP VP Percent: 3.64 %
Brady Statistic AP VS Percent: 0.01 %
Brady Statistic AS VP Percent: 96.3 %
Brady Statistic RV Percent Paced: 99.87 %
Date Time Interrogation Session: 20160720163306
HIGH POWER IMPEDANCE MEASURED VALUE: 74 Ohm
HighPow Impedance: 304 Ohm
Lead Channel Impedance Value: 209 Ohm
Lead Channel Impedance Value: 342 Ohm
Lead Channel Impedance Value: 627 Ohm
Lead Channel Impedance Value: 646 Ohm
Lead Channel Impedance Value: 722 Ohm
Lead Channel Pacing Threshold Amplitude: 0.5 V
Lead Channel Pacing Threshold Amplitude: 1 V
Lead Channel Pacing Threshold Pulse Width: 0.4 ms
Lead Channel Pacing Threshold Pulse Width: 0.4 ms
Lead Channel Pacing Threshold Pulse Width: 0.4 ms
Lead Channel Sensing Intrinsic Amplitude: 15.25 mV
Lead Channel Setting Pacing Amplitude: 1.5 V
Lead Channel Setting Pacing Amplitude: 2.25 V
MDC IDC MSMT LEADCHNL RA SENSING INTR AMPL: 1.875 mV
MDC IDC MSMT LEADCHNL RA SENSING INTR AMPL: 2 mV
MDC IDC MSMT LEADCHNL RV PACING THRESHOLD AMPLITUDE: 1 V
MDC IDC MSMT LEADCHNL RV SENSING INTR AMPL: 15.625 mV
MDC IDC SET LEADCHNL LV PACING PULSEWIDTH: 0.4 ms
MDC IDC SET LEADCHNL RA PACING AMPLITUDE: 1.5 V
MDC IDC SET LEADCHNL RV PACING PULSEWIDTH: 0.4 ms
MDC IDC SET LEADCHNL RV SENSING SENSITIVITY: 0.3 mV
MDC IDC SET ZONE DETECTION INTERVAL: 300 ms
MDC IDC STAT BRADY AS VS PERCENT: 0.04 %
MDC IDC STAT BRADY RA PERCENT PACED: 3.65 %
Zone Setting Detection Interval: 340 ms
Zone Setting Detection Interval: 350 ms
Zone Setting Detection Interval: 450 ms

## 2015-03-27 MED ORDER — SPIRONOLACTONE 25 MG PO TABS: 12.5000 mg | ORAL_TABLET | Freq: Every day | ORAL | Status: DC

## 2015-03-27 NOTE — Progress Notes (Signed)
Patient Care Team: [name], MD as PCP - General   HPI  [name] is a 59 y.o. female seen today in follow-up for congestive heart failure in the setting of nonischemic cardiomyopathy. She is status post CRTD implantation about 4 years ago.She has a sprint Fidelis lead she reached ERI in Aprill 2013  We reversed the rate sense portion of the RV and LV leads   In 2/16 she fractured her lead and underwent extraction and ICD lead reimplantation She has tolerated this without difficulty.  The patient denies chest pain, nocturnal dyspnea, orthopnea.    She has done much much better since having been reprogrammed for her AV delay. She has not taken any interval metolazone. Her shortness of breath is markedly attenuated.  Potassium level around that time was 3.4.    She underwent catheterization 12/10 , coronaries were normal and filling pressures were normal  [employer] study in 2007 normal perfusion by report. Echo 5/12 EF remains in the 55-60% range  most recent echo 11/15 had an EF of 45%.   Medical history notable for breast CA in 1998 treated with Adriamycin x 4 cycles.       Past Medical History  Diagnosis Date  . Breast cancer     s/p mastectomy and chemo with adriamycin (1998)  . Obesity   . NSVT (nonsustained ventricular tachycardia)   . Biventricular implantable cardiac defibrillator)     Medtronic  . Nonischemic cardiomyopathy     EF 55% echo 2012  . sprint Fidelis   . Gout     takes Allopurinol daily  . GERD (gastroesophageal reflux disease)     takes Omeprazole daily  . Insomnia     takes Ambien nightly  . CHF (congestive heart failure)     takes Lasix and Aldactone daily  . Hyperlipidemia     takes Simvastatin daily  . Presence of permanent cardiac pacemaker   . AICD (automatic cardioverter/defibrillator) present   . Shortness of breath dyspnea     with exertion  . Pneumonia 57yrs ago  . History of bronchitis 2 yrs ago  . History of shingles      Past Surgical History  Procedure Laterality Date  . Biv icd  2007    Medtronic  . Biv icd genertaor change out N/A 12/16/2011    Procedure: BIV ICD GENERTAOR CHANGE OUT;  Surgeon: [name], MD;  Location: [facility] CATH LAB;  Service: Cardiovascular;  Laterality: N/A;  . Abdominal hysterectomy    . Appendectomy  1978  . Mastectomy Left 1998  . Cyst removed from wrist Left     early 70's  . Port a cath placed  1998  . Port removed  1998  . Colonoscopy    . Icd lead removal Right 10/18/2014    Procedure: ICD LEAD REMOVAL/EXTRACTION/REIMPLANTATION;  Surgeon: Evans Lance, MD;  Location: [facility] OR;  Service: Cardiovascular;  Laterality: Right;    Current Outpatient Prescriptions  Medication Sig Dispense Refill  . allopurinol (ZYLOPRIM) 300 MG tablet Take 300 mg by mouth daily.      . carvedilol (COREG) 12.5 MG tablet Take 1 tablet (12.5 mg total) by mouth 2 (two) times daily with a meal. 180 tablet 3  . citalopram (CELEXA) 40 MG tablet Take 40 mg by mouth daily.      Marland Kitchen losartan (COZAAR) 50 MG tablet Take 1 tablet (50 mg total) by mouth daily. 90 tablet 3  . metolazone (ZAROXOLYN) 2.5 MG tablet TAKE 1  TABLET DAILY AS NEEDED FOR WEIGHT GAIN OF 3 POUNDS OR MORE OVERNIGHT 90 tablet 3  . multivitamin-iron-minerals-folic acid (THERAPEUTIC-M) TABS tablet Take 1 tablet by mouth daily.    Marland Kitchen omeprazole (PRILOSEC) 20 MG capsule Take 20 mg by mouth as needed (acid reflux).     . potassium chloride SA (K-DUR,KLOR-CON) 20 MEQ tablet Take 20 mEq by mouth 2 (two) times daily.    . simvastatin (ZOCOR) 40 MG tablet TAKE 1 TABLET EVERY EVENING 90 tablet 6  . spironolactone (ALDACTONE) 25 MG tablet TAKE 1 TABLET EVERY DAY 90 tablet 3  . torsemide (DEMADEX) 20 MG tablet Take two tablets (40 mg) by mouth twice daily 120 tablet 11  . zolpidem (AMBIEN) 10 MG tablet Take 5 mg by mouth at bedtime as needed. For sleep     No current facility-administered medications for this visit.    No Known  Allergies  Review of Systems negative except from HPI and PMH  Physical Exam BP 92/56 mmHg  Pulse 97  Ht 5\' 1"  (1.549 m)  Wt 207 lb (93.895 kg)  BMI 39.13 kg/m2 Well developed and nourished in no acute distress HENT normal Neck supple with JVP-flat Clear Regular rate and rhythm, no murmurs or gallops Abd-soft with active BS No Clubbing cyanosis 2+ edema Skin-warm and dry A & Oriented  Grossly normal sensory and motor function  ECG demonstrates P. Synchronous pacing with a biventricular configuration   Assessment and  Plan  Nonischemic cardiomyopathy  Congestive heart failure-chronic systolic grade 2  Defibrillator lead failure  Implantable defibrillator-Medtronic  HYPOkalemia  She is vastly improved following the AV optimization. She has not required metolazone. She is on potassium supplementation. Prior to AV optimization, potassium levels were low. We will recheck it today. We will arrange for follow-up with Dr. Reine Just in the heart failure clinic. She's having some orthostatic lightheadedness and hypotension; the liberty of decreasing her Aldactone from 25--12.5 twice a day. We will see her in 7 months. In the device clinic

## 2015-03-27 NOTE — Patient Instructions (Signed)
Medication Instructions:  Your physician has recommended you make the following change in your medication:  1) DECREASE Spironolactone to 12.5 mg daily  Labwork: None ordered  Testing/Procedures: None ordered  Follow-Up: Your physician recommends that you schedule a follow-up appointment in: 2 months with Dr. Haroldine Laws  Remote monitoring is used to monitor your Pacemaker of ICD from home. This monitoring reduces the number of office visits required to check your device to one time per year. It allows [place] to keep an eye on the functioning of your device to ensure it is working properly. You are scheduled for a device check from home on 06/26/15. You may send your transmission at any time that day. If you have a wireless device, the transmission will be sent automatically. After your physician reviews your transmission, you will receive a postcard with your next transmission date.  Your physician wants you to follow-up in: 1 year with Dr. Caryl Comes.  You will receive a reminder letter in the mail two months in advance. If you don't receive a letter, please call our office to schedule the follow-up appointment.  Any Other Special Instructions Will Be Listed Below (If Applicable). Thank you for choosing [facility]!!

## 2015-04-01 ENCOUNTER — Other Ambulatory Visit (HOSPITAL_COMMUNITY): Payer: Self-pay | Admitting: *Deleted

## 2015-04-01 DIAGNOSIS — I5022 Chronic systolic (congestive) heart failure: Secondary | ICD-10-CM

## 2015-04-04 ENCOUNTER — Other Ambulatory Visit (HOSPITAL_COMMUNITY): Payer: Self-pay | Admitting: Cardiology

## 2015-04-04 DIAGNOSIS — I5022 Chronic systolic (congestive) heart failure: Secondary | ICD-10-CM

## 2015-04-04 MED ORDER — CARVEDILOL 12.5 MG PO TABS: 12.5000 mg | ORAL_TABLET | Freq: Two times a day (BID) | ORAL | Status: DC

## 2015-04-05 ENCOUNTER — Encounter: Payer: Self-pay | Admitting: Internal Medicine

## 2015-04-05 ENCOUNTER — Other Ambulatory Visit: Payer: Self-pay

## 2015-06-18 ENCOUNTER — Encounter: Payer: Self-pay | Admitting: Internal Medicine

## 2015-06-26 ENCOUNTER — Ambulatory Visit (INDEPENDENT_AMBULATORY_CARE_PROVIDER_SITE_OTHER): Payer: Medicare PPO | Admitting: *Deleted

## 2015-06-26 ENCOUNTER — Encounter: Payer: Self-pay | Admitting: Cardiology

## 2015-06-26 DIAGNOSIS — I428 Other cardiomyopathies: Secondary | ICD-10-CM

## 2015-06-26 DIAGNOSIS — I429 Cardiomyopathy, unspecified: Secondary | ICD-10-CM

## 2015-06-26 DIAGNOSIS — I5022 Chronic systolic (congestive) heart failure: Secondary | ICD-10-CM

## 2015-06-26 LAB — CUP PACEART REMOTE DEVICE CHECK
Battery Remaining Longevity: 96 mo
Brady Statistic AS VP Percent: 93.8 %
Brady Statistic RA Percent Paced: 6.18 %
Brady Statistic RV Percent Paced: 99.63 %
Date Time Interrogation Session: 20161019073523
HighPow Impedance: 66 Ohm
Implantable Lead Implant Date: 20070821
Implantable Lead Implant Date: 20070821
Implantable Lead Implant Date: 20160211
Implantable Lead Location: 753859
Implantable Lead Model: 4194
Lead Channel Impedance Value: 190 Ohm
Lead Channel Impedance Value: 437 Ohm
Lead Channel Impedance Value: 665 Ohm
Lead Channel Pacing Threshold Amplitude: 1.125 V
Lead Channel Pacing Threshold Pulse Width: 0.4 ms
Lead Channel Pacing Threshold Pulse Width: 0.4 ms
Lead Channel Sensing Intrinsic Amplitude: 2.375 mV
Lead Channel Setting Pacing Amplitude: 1.5 V
Lead Channel Setting Pacing Amplitude: 1.75 V
Lead Channel Setting Pacing Pulse Width: 0.4 ms
MDC IDC LEAD LOCATION: 753858
MDC IDC LEAD LOCATION: 753860
MDC IDC LEAD MODEL: 6935
MDC IDC MSMT BATTERY VOLTAGE: 3.01 V
MDC IDC MSMT LEADCHNL LV IMPEDANCE VALUE: 589 Ohm
MDC IDC MSMT LEADCHNL LV PACING THRESHOLD AMPLITUDE: 0.5 V
MDC IDC MSMT LEADCHNL LV PACING THRESHOLD PULSEWIDTH: 0.4 ms
MDC IDC MSMT LEADCHNL RA IMPEDANCE VALUE: 342 Ohm
MDC IDC MSMT LEADCHNL RV IMPEDANCE VALUE: 589 Ohm
MDC IDC MSMT LEADCHNL RV PACING THRESHOLD AMPLITUDE: 1.5 V
MDC IDC MSMT LEADCHNL RV SENSING INTR AMPL: 17.625 mV
MDC IDC SET LEADCHNL LV PACING PULSEWIDTH: 0.4 ms
MDC IDC SET LEADCHNL RV PACING AMPLITUDE: 2.25 V
MDC IDC SET LEADCHNL RV SENSING SENSITIVITY: 0.3 mV
MDC IDC STAT BRADY AP VP PERCENT: 6.17 %
MDC IDC STAT BRADY AP VS PERCENT: 0.01 %
MDC IDC STAT BRADY AS VS PERCENT: 0.02 %

## 2015-06-26 NOTE — Progress Notes (Signed)
Remote ICD transmission.   

## 2015-07-04 ENCOUNTER — Encounter (HOSPITAL_COMMUNITY): Payer: Medicare PPO | Admitting: Internal Medicine

## 2015-07-25 ENCOUNTER — Ambulatory Visit (HOSPITAL_COMMUNITY)
Admission: RE | Admit: 2015-07-25 | Discharge: 2015-07-25 | Disposition: A | Discharge status: Home / Self Care | Payer: Medicare PPO | Source: Ambulatory Visit | Attending: Internal Medicine | Admitting: Internal Medicine

## 2015-07-25 VITALS — BP 90/52 | HR 85 | Wt 200.0 lb

## 2015-07-25 DIAGNOSIS — I5022 Chronic systolic (congestive) heart failure: Secondary | ICD-10-CM

## 2015-07-25 DIAGNOSIS — E669 Obesity, unspecified: Secondary | ICD-10-CM | POA: Insufficient documentation

## 2015-07-25 DIAGNOSIS — I5032 Chronic diastolic (congestive) heart failure: Secondary | ICD-10-CM | POA: Insufficient documentation

## 2015-07-25 DIAGNOSIS — Z853 Personal history of malignant neoplasm of breast: Secondary | ICD-10-CM | POA: Insufficient documentation

## 2015-07-25 DIAGNOSIS — Z79899 Other long term (current) drug therapy: Secondary | ICD-10-CM | POA: Insufficient documentation

## 2015-07-25 DIAGNOSIS — I429 Cardiomyopathy, unspecified: Secondary | ICD-10-CM | POA: Diagnosis not present

## 2015-07-25 DIAGNOSIS — Z95810 Presence of automatic (implantable) cardiac defibrillator: Secondary | ICD-10-CM | POA: Diagnosis not present

## 2015-07-25 DIAGNOSIS — E785 Hyperlipidemia, unspecified: Secondary | ICD-10-CM | POA: Insufficient documentation

## 2015-07-25 DIAGNOSIS — M109 Gout, unspecified: Secondary | ICD-10-CM | POA: Insufficient documentation

## 2015-07-25 DIAGNOSIS — K219 Gastro-esophageal reflux disease without esophagitis: Secondary | ICD-10-CM | POA: Insufficient documentation

## 2015-07-25 NOTE — Progress Notes (Signed)
Patient ID: [name], female   DOB: Mar 10, 1956, 59 y.o.   MRN: [id number]   PCP: Dr Raeanne Gathers  HPI: [name] is a 59 y/o woman with history of breast CA in 1998 treated with Adriamycin x 4 cycles. In 2007, diagnosed with CHF with EF 25-35%. She is s/p Medtronic BiV-ICD in 2008 with Sprint Fidelis lead. LV function has subsequently recovered  Had cath 12/10. Coronaries normal. Right atrial pressure mean of 7, RV pressure 28/6 . Pulmonary artery pressure was 23/9 with mean of 15.  Pulmonary capillary wedge pressure mean of 9. Fick cardiac output 3.9 L per minute.  Cardiac index 2.2 L per minute per meter squared.  Pulmonary vascular resistance was 1.5 Woods units.  Follow up: Recovering from tonsillectomy. Overall doing ok. Denies SOB/PND /Orthopnea. Taking all medications.      Optivol: ICD was checked today.Fluid trending up to baseline. Activity level 4-6 hours per day. No AF/VT.   Echo 5/11: EF 50-55%  12/15/11 ECHO EF 50%. RV normal. 12/16/11 ICD generator replaced due to EOL 04/2013 EF 50% 07/19/14 Echo EF 50% inferior HK GLS -17.8     ROS: All systems negative except as listed in HPI, PMH and Problem List.  Past Medical History  Diagnosis Date  . Breast cancer     s/p mastectomy and chemo with adriamycin (1998)  . Obesity   . NSVT (nonsustained ventricular tachycardia)   . Biventricular implantable cardiac defibrillator)     Medtronic  . Nonischemic cardiomyopathy ([place])     EF 55% echo 2012  . sprint Fidelis   . Gout     takes Allopurinol daily  . GERD (gastroesophageal reflux disease)     takes Omeprazole daily  . Insomnia     takes Ambien nightly  . CHF (congestive heart failure)     takes Lasix and Aldactone daily  . Hyperlipidemia     takes Simvastatin daily  . Presence of permanent cardiac pacemaker   . AICD (automatic cardioverter/defibrillator) present   . Shortness of breath dyspnea     with exertion  . Pneumonia 46yrs ago  . History of bronchitis 2  yrs ago  . History of shingles     Current Outpatient Prescriptions  Medication Sig Dispense Refill  . allopurinol (ZYLOPRIM) 300 MG tablet Take 300 mg by mouth daily.      . carvedilol (COREG) 12.5 MG tablet Take 1 tablet (12.5 mg total) by mouth 2 (two) times daily with a meal. 180 tablet 3  . citalopram (CELEXA) 40 MG tablet Take 40 mg by mouth daily.      Marland Kitchen losartan (COZAAR) 50 MG tablet Take 1 tablet (50 mg total) by mouth daily. 90 tablet 3  . multivitamin-iron-minerals-folic acid (THERAPEUTIC-M) TABS tablet Take 1 tablet by mouth daily.    Marland Kitchen omeprazole (PRILOSEC) 20 MG capsule Take 20 mg by mouth daily.     . potassium chloride SA (K-DUR,KLOR-CON) 20 MEQ tablet Take 20 mEq by mouth daily.     . simvastatin (ZOCOR) 40 MG tablet TAKE 1 TABLET EVERY EVENING 90 tablet 6  . spironolactone (ALDACTONE) 25 MG tablet Take 0.5 tablets (12.5 mg total) by mouth daily. 45 tablet 3  . torsemide (DEMADEX) 20 MG tablet Take two tablets (40 mg) by mouth twice daily 120 tablet 11  . zolpidem (AMBIEN) 10 MG tablet Take 5 mg by mouth at bedtime as needed. For sleep    . metolazone (ZAROXOLYN) 2.5 MG tablet TAKE 1 TABLET DAILY AS NEEDED  FOR WEIGHT GAIN OF 3 POUNDS OR MORE OVERNIGHT (Patient not taking: Reported on 07/25/2015) 90 tablet 3   No current facility-administered medications for this encounter.   PHYSICAL EXAM: Filed Vitals:   07/25/15 1358  BP: 90/52  Pulse: 85  Weight: 200 lb (90.719 kg)  SpO2: 98%   General:  Well appearing. No resp difficulty HEENT: normal Neck: supple. JVP  ~7-8 Carotids 2+ bilaterally; no bruits. No lymphadenopathy or thryomegaly appreciated. Cor: PMI normal. Regular rate & rhythm. No rubs, gallops or murmurs. ICD wound well healed  Lungs: clear Abdomen: soft, nontender, nondistended. No hepatosplenomegaly. No bruits or masses. Good bowel sounds. Extremities: no cyanosis, clubbing, rash, trace lower extremity edema.  Neuro: alert & orientedx3, cranial nerves  grossly intact. Moves all 4 extremities w/o difficulty. Affect pleasant.  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 - Doing very well with stable NYHA II symptoms. Volume status mildly elevated.  Continue current diuretic regimen.- ICD/Optivol interrogated in Clinic  - Continue current Coreg, losartan, and spironolactone.  BP has been generally too soft to handle titration (SBP runs in 90s at home)  2) Medtronic BiV ICD: s/p change out.   Amy Clegg NP-C  07/25/2015 3:03 PM   Patient seen and examined with Darrick Grinder, NP. We discussed all aspects of the encounter. I agree with the assessment and plan as stated above.   Had set back with tonsillectomy and severe reaction to pain meds. Overall improved. Volume status mildly elevated on exam and by optivol. No VT/AF on device.Reinforced need for daily weights and reviewed use of sliding scale diuretics.   Jacari Iannello,MD 3:02 PM

## 2015-07-25 NOTE — Patient Instructions (Signed)
FOLLOW UP: 6 months with Dr.Bensimhon

## 2015-07-25 NOTE — Progress Notes (Signed)
Advanced Heart Failure Medication Review by a Pharmacist  Does the patient  feel that his/her medications are working for him/her?  yes  Has the patient been experiencing any side effects to the medications prescribed?  no  Does the patient measure his/her own blood pressure or blood glucose at home?  no   Does the patient have any problems obtaining medications due to transportation or finances?   no  Understanding of regimen: good Understanding of indications: good Potential of compliance: good Patient understands to avoid NSAIDs. Patient understands to avoid decongestants.  Issues to address at subsequent visits: None   Pharmacist comments:  [name] is a pleasant 59 yo F presenting with a current medication list. She reports excellent compliance with her medications and did not have any specific medication-related questions or concerns for me at this time.   Ruta Hinds. Velva Harman, PharmD, BCPS, CPP Clinical Pharmacist Pager: [phone] Phone: [phone] 07/25/2015 2:29 PM      Time with patient: 8 minutes Preparation and documentation time: 2 minutes Total time: 10 minutes

## 2015-07-26 ENCOUNTER — Telehealth (HOSPITAL_COMMUNITY): Payer: Self-pay

## 2015-07-26 NOTE — Telephone Encounter (Signed)
Office notes of 07/25/15 Latest echo results Faxed to [facility] [phone]

## 2015-08-12 ENCOUNTER — Encounter: Payer: Self-pay | Admitting: Internal Medicine

## 2015-08-17 ENCOUNTER — Other Ambulatory Visit (HOSPITAL_COMMUNITY): Payer: Self-pay | Admitting: Internal Medicine

## 2015-09-25 ENCOUNTER — Ambulatory Visit (INDEPENDENT_AMBULATORY_CARE_PROVIDER_SITE_OTHER): Payer: Medicare PPO | Admitting: *Deleted

## 2015-09-25 DIAGNOSIS — I428 Other cardiomyopathies: Secondary | ICD-10-CM

## 2015-09-25 DIAGNOSIS — I5022 Chronic systolic (congestive) heart failure: Secondary | ICD-10-CM | POA: Diagnosis not present

## 2015-09-25 DIAGNOSIS — Z95810 Presence of automatic (implantable) cardiac defibrillator: Secondary | ICD-10-CM | POA: Diagnosis not present

## 2015-09-25 DIAGNOSIS — I429 Cardiomyopathy, unspecified: Secondary | ICD-10-CM | POA: Diagnosis not present

## 2015-09-25 NOTE — Progress Notes (Signed)
Remote ICD transmission.   

## 2015-09-27 ENCOUNTER — Encounter: Payer: Self-pay | Admitting: Cardiology

## 2015-09-27 LAB — CUP PACEART REMOTE DEVICE CHECK
Battery Remaining Longevity: 93 mo
Battery Voltage: 3 V
Brady Statistic AS VP Percent: 86.65 %
Brady Statistic AS VS Percent: 0.03 %
Brady Statistic RA Percent Paced: 13.32 %
Date Time Interrogation Session: 20170118051603
HighPow Impedance: 73 Ohm
Implantable Lead Implant Date: 20070821
Implantable Lead Implant Date: 20070821
Implantable Lead Implant Date: 20160211
Implantable Lead Location: 753859
Implantable Lead Location: 753860
Implantable Lead Model: 5076
Implantable Lead Model: 6935
Lead Channel Impedance Value: 342 Ohm
Lead Channel Impedance Value: 437 Ohm
Lead Channel Pacing Threshold Amplitude: 0.5 V
Lead Channel Pacing Threshold Amplitude: 0.875 V
Lead Channel Pacing Threshold Amplitude: 1.375 V
Lead Channel Pacing Threshold Pulse Width: 0.4 ms
Lead Channel Pacing Threshold Pulse Width: 0.4 ms
Lead Channel Sensing Intrinsic Amplitude: 1.75 mV
Lead Channel Sensing Intrinsic Amplitude: 1.75 mV
Lead Channel Sensing Intrinsic Amplitude: 15.625 mV
Lead Channel Setting Pacing Amplitude: 2.25 V
Lead Channel Setting Pacing Pulse Width: 0.4 ms
Lead Channel Setting Sensing Sensitivity: 0.3 mV
MDC IDC LEAD LOCATION: 753858
MDC IDC LEAD MODEL: 4194
MDC IDC MSMT LEADCHNL LV IMPEDANCE VALUE: 171 Ohm
MDC IDC MSMT LEADCHNL LV IMPEDANCE VALUE: 627 Ohm
MDC IDC MSMT LEADCHNL LV IMPEDANCE VALUE: 665 Ohm
MDC IDC MSMT LEADCHNL RA PACING THRESHOLD PULSEWIDTH: 0.4 ms
MDC IDC MSMT LEADCHNL RV IMPEDANCE VALUE: 589 Ohm
MDC IDC MSMT LEADCHNL RV SENSING INTR AMPL: 15.625 mV
MDC IDC SET LEADCHNL LV PACING AMPLITUDE: 1.5 V
MDC IDC SET LEADCHNL LV PACING PULSEWIDTH: 0.4 ms
MDC IDC SET LEADCHNL RA PACING AMPLITUDE: 1.5 V
MDC IDC STAT BRADY AP VP PERCENT: 13.31 %
MDC IDC STAT BRADY AP VS PERCENT: 0.01 %
MDC IDC STAT BRADY RV PERCENT PACED: 99.74 %

## 2015-10-04 ENCOUNTER — Other Ambulatory Visit (HOSPITAL_COMMUNITY): Payer: Self-pay | Admitting: *Deleted

## 2015-10-04 DIAGNOSIS — Z4502 Encounter for adjustment and management of automatic implantable cardiac defibrillator: Secondary | ICD-10-CM

## 2015-10-04 MED ORDER — SPIRONOLACTONE 25 MG PO TABS: 12.5000 mg | ORAL_TABLET | Freq: Every day | ORAL | Status: DC

## 2015-10-16 ENCOUNTER — Other Ambulatory Visit (HOSPITAL_COMMUNITY): Payer: Self-pay | Admitting: *Deleted

## 2015-11-14 ENCOUNTER — Telehealth (HOSPITAL_COMMUNITY): Payer: Self-pay | Admitting: Vascular Surgery

## 2015-11-14 NOTE — Telephone Encounter (Signed)
Returned pt call to make appt w/ DB in MAY

## 2015-11-25 ENCOUNTER — Encounter: Payer: Self-pay | Admitting: Internal Medicine

## 2015-12-10 ENCOUNTER — Other Ambulatory Visit: Payer: Self-pay | Admitting: *Deleted

## 2015-12-10 MED ORDER — TORSEMIDE 20 MG PO TABS: ORAL_TABLET | ORAL | Status: DC

## 2015-12-25 ENCOUNTER — Ambulatory Visit (INDEPENDENT_AMBULATORY_CARE_PROVIDER_SITE_OTHER): Payer: Medicare PPO | Admitting: *Deleted

## 2015-12-25 DIAGNOSIS — I429 Cardiomyopathy, unspecified: Secondary | ICD-10-CM

## 2015-12-25 DIAGNOSIS — I428 Other cardiomyopathies: Secondary | ICD-10-CM

## 2015-12-25 DIAGNOSIS — I5022 Chronic systolic (congestive) heart failure: Secondary | ICD-10-CM | POA: Diagnosis not present

## 2015-12-25 DIAGNOSIS — Z95810 Presence of automatic (implantable) cardiac defibrillator: Secondary | ICD-10-CM

## 2015-12-25 NOTE — Progress Notes (Signed)
Remote ICD transmission.   

## 2016-01-16 ENCOUNTER — Encounter (HOSPITAL_COMMUNITY): Payer: Self-pay | Admitting: Internal Medicine

## 2016-01-16 ENCOUNTER — Ambulatory Visit (HOSPITAL_COMMUNITY)
Admission: RE | Admit: 2016-01-16 | Discharge: 2016-01-16 | Disposition: A | Discharge status: Home / Self Care | Payer: Medicare PPO | Source: Ambulatory Visit | Attending: Internal Medicine | Admitting: Internal Medicine

## 2016-01-16 VITALS — BP 102/50 | HR 82 | Wt 195.2 lb

## 2016-01-16 DIAGNOSIS — M109 Gout, unspecified: Secondary | ICD-10-CM | POA: Insufficient documentation

## 2016-01-16 DIAGNOSIS — Z853 Personal history of malignant neoplasm of breast: Secondary | ICD-10-CM | POA: Diagnosis present

## 2016-01-16 DIAGNOSIS — Z9221 Personal history of antineoplastic chemotherapy: Secondary | ICD-10-CM | POA: Insufficient documentation

## 2016-01-16 DIAGNOSIS — K219 Gastro-esophageal reflux disease without esophagitis: Secondary | ICD-10-CM | POA: Diagnosis not present

## 2016-01-16 DIAGNOSIS — G4700 Insomnia, unspecified: Secondary | ICD-10-CM | POA: Diagnosis not present

## 2016-01-16 DIAGNOSIS — E785 Hyperlipidemia, unspecified: Secondary | ICD-10-CM | POA: Diagnosis not present

## 2016-01-16 DIAGNOSIS — Z95810 Presence of automatic (implantable) cardiac defibrillator: Secondary | ICD-10-CM | POA: Diagnosis not present

## 2016-01-16 DIAGNOSIS — Z79899 Other long term (current) drug therapy: Secondary | ICD-10-CM | POA: Insufficient documentation

## 2016-01-16 DIAGNOSIS — I5022 Chronic systolic (congestive) heart failure: Secondary | ICD-10-CM

## 2016-01-16 DIAGNOSIS — I5032 Chronic diastolic (congestive) heart failure: Secondary | ICD-10-CM | POA: Insufficient documentation

## 2016-01-16 DIAGNOSIS — Z7984 Long term (current) use of oral hypoglycemic drugs: Secondary | ICD-10-CM | POA: Insufficient documentation

## 2016-01-16 NOTE — Patient Instructions (Signed)
Follow up 6 months with Dr. Bensimhon.  Do the following things EVERYDAY: 1) Weigh yourself in the morning before breakfast. Write it down and keep it in a log. 2) Take your medicines as prescribed 3) Eat low salt foods-Limit salt (sodium) to 2000 mg per day.  4) Stay as active as you can everyday 5) Limit all fluids for the day to less than 2 liters  

## 2016-01-16 NOTE — Progress Notes (Signed)
Patient ID: [name], female   DOB: [birth year], 60 y.o.   MRN: [id number] Patient ID: [name], female   DOB: 1956-06-15, 60 y.o.   MRN: [id number]   PCP: Dr Raeanne Gathers  HPI: [name] is a 60 y/o woman with history of breast CA in 1998 treated with Adriamycin x 4 cycles. In 2007, diagnosed with CHF with EF 25-35%. She is s/p Medtronic BiV-ICD in 2008 with Sprint Fidelis lead. LV function has subsequently recovered  Had cath 12/10. Coronaries normal. Right atrial pressure mean of 7, RV pressure 28/6 . Pulmonary artery pressure was 23/9 with mean of 15.  Pulmonary capillary wedge pressure mean of 9. Fick cardiac output 3.9 L per minute.  Cardiac index 2.2 L per minute per meter squared.  Pulmonary vascular resistance was 1.5 Woods units.  Follow up: Feels great. Active without HF symptoms.  Denies SOB/PND /Orthopnea. BP stable but soft 90-100s. Taking all medications.  Has lost 15 pounds in past year.    Echo 5/11: EF 50-55%  12/15/11 ECHO EF 50%. RV normal. 12/16/11 ICD generator replaced due to EOL 04/2013 EF 50% 07/19/14 Echo EF 50% inferior HK GLS -17.8 6/16 Echo EF 45-50   ROS: All systems negative except as listed in HPI, PMH and Problem List.  Past Medical History  Diagnosis Date  . Breast cancer ([place])     s/p mastectomy and chemo with adriamycin (1998)  . Obesity   . NSVT (nonsustained ventricular tachycardia) ([place])   . Biventricular implantable cardiac defibrillator)     Medtronic  . Nonischemic cardiomyopathy ([place])     EF 55% echo 2012  . sprint Fidelis   . Gout     takes Allopurinol daily  . GERD (gastroesophageal reflux disease)     takes Omeprazole daily  . Insomnia     takes Ambien nightly  . CHF (congestive heart failure) ([facility])     takes Lasix and Aldactone daily  . Hyperlipidemia     takes Simvastatin daily  . Presence of permanent cardiac pacemaker   . AICD (automatic cardioverter/defibrillator) present   . Shortness of breath dyspnea     with  exertion  . Pneumonia 69yrs ago  . History of bronchitis 2 yrs ago  . History of shingles     Current Outpatient Prescriptions  Medication Sig Dispense Refill  . allopurinol (ZYLOPRIM) 300 MG tablet Take 300 mg by mouth daily.      . carvedilol (COREG) 12.5 MG tablet Take 1 tablet (12.5 mg total) by mouth 2 (two) times daily with a meal. 180 tablet 3  . Cholecalciferol (VITAMIN D3) 5000 units TABS Take by mouth.    . citalopram (CELEXA) 40 MG tablet Take 40 mg by mouth daily.      Javier Docker Oil 1000 MG CAPS Take 1,000 mg by mouth.    . losartan (COZAAR) 50 MG tablet TAKE 1 TABLET EVERY DAY 90 tablet 3  . metFORMIN (GLUCOPHAGE XR) 500 MG 24 hr tablet Take 500 mg by mouth daily with breakfast.    . metolazone (ZAROXOLYN) 2.5 MG tablet TAKE 1 TABLET DAILY AS NEEDED FOR WEIGHT GAIN OF 3 POUNDS OR MORE OVERNIGHT 90 tablet 3  . multivitamin-iron-minerals-folic acid (THERAPEUTIC-M) TABS tablet Take 1 tablet by mouth daily.    Marland Kitchen omeprazole (PRILOSEC) 20 MG capsule Take 20 mg by mouth daily.     . potassium chloride SA (K-DUR,KLOR-CON) 20 MEQ tablet Take 20 mEq by mouth daily.     . simvastatin (ZOCOR)  40 MG tablet TAKE 1 TABLET EVERY EVENING 90 tablet 6  . spironolactone (ALDACTONE) 25 MG tablet Take 0.5 tablets (12.5 mg total) by mouth daily. 45 tablet 3  . torsemide (DEMADEX) 20 MG tablet Take two tablets (40 mg) by mouth twice daily 360 tablet 0  . venlafaxine XR (EFFEXOR-XR) 150 MG 24 hr capsule Take 150 mg by mouth daily with breakfast.    . vitamin B-12 (CYANOCOBALAMIN) 1000 MCG tablet Take 6,000 mcg by mouth daily.    Marland Kitchen zolpidem (AMBIEN) 10 MG tablet Take 5 mg by mouth at bedtime as needed. For sleep     No current facility-administered medications for this encounter.   PHYSICAL EXAM: Filed Vitals:   01/16/16 0902  BP: 102/50  Pulse: 82  Weight: 195 lb 4 oz (88.565 kg)  SpO2: 99%   General:  Well appearing. No resp difficulty HEENT: normal Neck: supple. JVP  ~7-8 Carotids 2+  bilaterally; no bruits. No lymphadenopathy or thryomegaly appreciated. Cor: PMI normal. Regular rate & rhythm. No rubs, gallops or murmurs. ICD wound well healed  Lungs: clear Abdomen: soft, nontender, nondistended. No hepatosplenomegaly. No bruits or masses. Good bowel sounds. Extremities: no cyanosis, clubbing, rash, trace lower extremity edema.  Neuro: alert & orientedx3, cranial nerves grossly intact. Moves all 4 extremities w/o difficulty. Affect pleasant.  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 - Doing very well with stable NYHA I-II symptoms. Volume status mildly elevated. Reinforced need for daily weights and reviewed use of sliding scale diuretics. - Continue current Coreg, losartan, and spironolactone.  BP has been generally too soft to handle titration (SBP runs in 90s at home)  2) Medtronic BiV ICD: s/p change out.   Glori Bickers MD  01/16/2016 9:29 AM

## 2016-01-29 ENCOUNTER — Encounter: Payer: Self-pay | Admitting: Internal Medicine

## 2016-01-31 ENCOUNTER — Encounter: Payer: Self-pay | Admitting: Cardiology

## 2016-01-31 LAB — CUP PACEART REMOTE DEVICE CHECK
Brady Statistic AP VP Percent: 6.34 %
Brady Statistic AS VP Percent: 93.56 %
Brady Statistic RA Percent Paced: 6.36 %
Brady Statistic RV Percent Paced: 99.1 %
HighPow Impedance: 77 Ohm
Implantable Lead Implant Date: 20070821
Implantable Lead Implant Date: 20160211
Implantable Lead Location: 753859
Implantable Lead Model: 5076
Lead Channel Impedance Value: 171 Ohm
Lead Channel Impedance Value: 342 Ohm
Lead Channel Impedance Value: 437 Ohm
Lead Channel Impedance Value: 627 Ohm
Lead Channel Impedance Value: 665 Ohm
Lead Channel Pacing Threshold Amplitude: 1 V
Lead Channel Pacing Threshold Pulse Width: 0.4 ms
Lead Channel Pacing Threshold Pulse Width: 0.4 ms
Lead Channel Sensing Intrinsic Amplitude: 1.875 mV
Lead Channel Setting Pacing Amplitude: 2 V
Lead Channel Setting Pacing Pulse Width: 0.4 ms
Lead Channel Setting Sensing Sensitivity: 0.3 mV
MDC IDC LEAD IMPLANT DT: 20070821
MDC IDC LEAD LOCATION: 753858
MDC IDC LEAD LOCATION: 753860
MDC IDC LEAD MODEL: 4194
MDC IDC LEAD MODEL: 6935
MDC IDC MSMT BATTERY REMAINING LONGEVITY: 93 mo
MDC IDC MSMT BATTERY VOLTAGE: 2.99 V
MDC IDC MSMT LEADCHNL LV IMPEDANCE VALUE: 627 Ohm
MDC IDC MSMT LEADCHNL LV PACING THRESHOLD AMPLITUDE: 0.5 V
MDC IDC MSMT LEADCHNL RA PACING THRESHOLD PULSEWIDTH: 0.4 ms
MDC IDC MSMT LEADCHNL RA SENSING INTR AMPL: 1.875 mV
MDC IDC MSMT LEADCHNL RV PACING THRESHOLD AMPLITUDE: 1.25 V
MDC IDC MSMT LEADCHNL RV SENSING INTR AMPL: 14.375 mV
MDC IDC MSMT LEADCHNL RV SENSING INTR AMPL: 14.375 mV
MDC IDC SESS DTM: 20170419052503
MDC IDC SET LEADCHNL LV PACING AMPLITUDE: 1.5 V
MDC IDC SET LEADCHNL LV PACING PULSEWIDTH: 0.4 ms
MDC IDC SET LEADCHNL RA PACING AMPLITUDE: 1.5 V
MDC IDC STAT BRADY AP VS PERCENT: 0.01 %
MDC IDC STAT BRADY AS VS PERCENT: 0.08 %

## 2016-04-06 ENCOUNTER — Ambulatory Visit (INDEPENDENT_AMBULATORY_CARE_PROVIDER_SITE_OTHER): Payer: Medicare PPO | Admitting: Internal Medicine

## 2016-04-06 ENCOUNTER — Encounter: Payer: Self-pay | Admitting: Internal Medicine

## 2016-04-06 VITALS — BP 111/69 | HR 98 | Ht 60.0 in | Wt 192.6 lb

## 2016-04-06 DIAGNOSIS — I429 Cardiomyopathy, unspecified: Secondary | ICD-10-CM

## 2016-04-06 DIAGNOSIS — I5022 Chronic systolic (congestive) heart failure: Secondary | ICD-10-CM | POA: Diagnosis not present

## 2016-04-06 DIAGNOSIS — Z95810 Presence of automatic (implantable) cardiac defibrillator: Secondary | ICD-10-CM

## 2016-04-06 DIAGNOSIS — I428 Other cardiomyopathies: Secondary | ICD-10-CM

## 2016-04-06 LAB — CUP PACEART INCLINIC DEVICE CHECK
Battery Remaining Longevity: 92 mo
Brady Statistic AP VP Percent: 6.59 %
Brady Statistic AP VS Percent: 0.01 %
Brady Statistic AS VS Percent: 0.1 %
Brady Statistic RV Percent Paced: 99.33 %
Date Time Interrogation Session: 20170731151848
HighPow Impedance: 83 Ohm
Implantable Lead Implant Date: 20070821
Implantable Lead Implant Date: 20160211
Implantable Lead Location: 753859
Implantable Lead Model: 4194
Implantable Lead Model: 6935
Lead Channel Impedance Value: 665 Ohm
Lead Channel Impedance Value: 703 Ohm
Lead Channel Impedance Value: 760 Ohm
Lead Channel Pacing Threshold Amplitude: 0.5 V
Lead Channel Pacing Threshold Amplitude: 1 V
Lead Channel Pacing Threshold Pulse Width: 0.4 ms
Lead Channel Sensing Intrinsic Amplitude: 1.75 mV
Lead Channel Sensing Intrinsic Amplitude: 18.375 mV
Lead Channel Sensing Intrinsic Amplitude: 2.625 mV
Lead Channel Setting Pacing Amplitude: 1.5 V
Lead Channel Setting Pacing Amplitude: 1.5 V
Lead Channel Setting Pacing Pulse Width: 0.4 ms
Lead Channel Setting Sensing Sensitivity: 0.3 mV
MDC IDC LEAD IMPLANT DT: 20070821
MDC IDC LEAD LOCATION: 753858
MDC IDC LEAD LOCATION: 753860
MDC IDC MSMT BATTERY VOLTAGE: 2.98 V
MDC IDC MSMT LEADCHNL LV IMPEDANCE VALUE: 171 Ohm
MDC IDC MSMT LEADCHNL LV PACING THRESHOLD PULSEWIDTH: 0.4 ms
MDC IDC MSMT LEADCHNL RA IMPEDANCE VALUE: 342 Ohm
MDC IDC MSMT LEADCHNL RV IMPEDANCE VALUE: 494 Ohm
MDC IDC MSMT LEADCHNL RV PACING THRESHOLD AMPLITUDE: 1.5 V
MDC IDC MSMT LEADCHNL RV PACING THRESHOLD PULSEWIDTH: 0.4 ms
MDC IDC MSMT LEADCHNL RV SENSING INTR AMPL: 19 mV
MDC IDC SET LEADCHNL LV PACING PULSEWIDTH: 0.4 ms
MDC IDC SET LEADCHNL RV PACING AMPLITUDE: 2 V
MDC IDC STAT BRADY AS VP PERCENT: 93.3 %
MDC IDC STAT BRADY RA PERCENT PACED: 6.6 %

## 2016-04-06 MED ORDER — CARVEDILOL 12.5 MG PO TABS: ORAL_TABLET | ORAL | 3 refills | Status: DC

## 2016-04-06 MED ORDER — CARVEDILOL 12.5 MG PO TABS: ORAL_TABLET | ORAL | Status: DC

## 2016-04-06 NOTE — Patient Instructions (Signed)
Medication Instructions: - Your physician has recommended you make the following change in your medication:  1) Increase coreg (carvedilol) to 12.5 mg take 1 & 1/2 tablets (18.75 mg) by mouth twice daily  Labwork: - none  Procedures/Testing: - none  Follow-Up: - Remote monitoring is used to monitor your Pacemaker of ICD from home. This monitoring reduces the number of office visits required to check your device to one time per year. It allows [place] to keep an eye on the functioning of your device to ensure it is working properly. You are scheduled for a device check from home on 07/06/16. You may send your transmission at any time that day. If you have a wireless device, the transmission will be sent automatically. After your physician reviews your transmission, you will receive a postcard with your next transmission date.  - Your physician wants you to follow-up in: 1 year with Dr. Caryl Comes. You will receive a reminder letter in the mail two months in advance. If you don't receive a letter, please call our office to schedule the follow-up appointment.  Any Additional Special Instructions Will Be Listed Below (If Applicable).     If you need a refill on your cardiac medications before your next appointment, please call your pharmacy.

## 2016-04-06 NOTE — Progress Notes (Signed)
Patient Care Team: [name], MD as PCP - General   HPI  [name] is a 60 y.o. female seen today in follow-up for congestive heart failure in the setting of nonischemic cardiomyopathy. She is status post CRTD implantation about 4 years ago.She has a sprint Fidelis lead she reached ERI in Aprill 2013  We reversed the rate sense portion of the RV and LV leads   In 2/16 she fractured her lead and underwent extraction and ICD lead reimplantation She has tolerated this without difficulty.  The patient denies chest pain, nocturnal dyspnea, orthopnea.    She has done much much better since having been reprogrammed for her AV delay. She has not taken any interval metolazone. Her shortness of breath is markedly attenuated.  Potassium level around that time has been low followed by her PCP. Potassium supplementation was adjusted.   She underwent catheterization 12/10 , coronaries were normal and filling pressures were normal  Nuke study in 2007 normal perfusion by report. Echo 5/12 EF remains in the 55-60% range  most recent echo 11/15 had an EF of 45%.   Medical history notable for breast CA in 1998 treated with Adriamycin x 4 cycles.       Past Medical History:  Diagnosis Date  . AICD (automatic cardioverter/defibrillator) present   . Biventricular implantable cardiac defibrillator)    Medtronic  . Breast cancer ([place])    s/p mastectomy and chemo with adriamycin (1998)  . CHF (congestive heart failure) (HCC)    takes Lasix and Aldactone daily  . GERD (gastroesophageal reflux disease)    takes Omeprazole daily  . Gout    takes Allopurinol daily  . History of bronchitis 2 yrs ago  . History of shingles   . Hyperlipidemia    takes Simvastatin daily  . Insomnia    takes Ambien nightly  . Nonischemic cardiomyopathy ([place])    EF 55% echo 2012  . NSVT (nonsustained ventricular tachycardia) ([place])   . Obesity   . Pneumonia 3yrs ago  . Presence of permanent cardiac  pacemaker   . Shortness of breath dyspnea    with exertion  . sprint Fidelis     Past Surgical History:  Procedure Laterality Date  . ABDOMINAL HYSTERECTOMY    . APPENDECTOMY  1978  . BiV ICD  2007   Medtronic  . BIV ICD GENERTAOR CHANGE OUT N/A 12/16/2011   Procedure: BIV ICD GENERTAOR CHANGE OUT;  Surgeon: [name], MD;  Location: [facility] C[facility] LAB;  Service: Cardiovascular;  Laterality: N/A;  . COLONOSCOPY    . cyst removed from wrist Left    early 70's  . ICD LEAD REMOVAL Right 10/18/2014   Procedure: ICD LEAD REMOVAL/EXTRACTION/REIMPLANTATION;  Surgeon: Evans Lance, MD;  Location: [place];  Service: Cardiovascular;  Laterality: Right;  . MASTECTOMY Left 1998  . port a cath placed  1998  . port removed  1998    Current Outpatient Prescriptions  Medication Sig Dispense Refill  . allopurinol (ZYLOPRIM) 300 MG tablet Take 300 mg by mouth daily.      . carvedilol (COREG) 12.5 MG tablet Take 1 tablet (12.5 mg total) by mouth 2 (two) times daily with a meal. 180 tablet 3  . Cholecalciferol (VITAMIN D3) 5000 units TABS Take 1 tablet by mouth daily as needed (vitamin supplement).     . losartan (COZAAR) 50 MG tablet TAKE 1 TABLET EVERY DAY 90 tablet 3  . metFORMIN (GLUCOPHAGE XR) 500 MG 24 hr tablet  Take 500 mg by mouth daily with breakfast.    . metolazone (ZAROXOLYN) 2.5 MG tablet TAKE 1 TABLET DAILY AS NEEDED FOR WEIGHT GAIN OF 3 POUNDS OR MORE OVERNIGHT 90 tablet 3  . multivitamin-iron-minerals-folic acid (THERAPEUTIC-M) TABS tablet Take 1 tablet by mouth daily.    . potassium chloride SA (K-DUR,KLOR-CON) 20 MEQ tablet Take 20 mEq by mouth 2 (two) times daily.     . ranitidine (ZANTAC 150 MAXIMUM STRENGTH) 150 MG tablet Take 150 mg by mouth 2 (two) times daily.    . simvastatin (ZOCOR) 40 MG tablet Take 40 mg by mouth daily.    Marland Kitchen spironolactone (ALDACTONE) 25 MG tablet Take 0.5 tablets (12.5 mg total) by mouth daily. 45 tablet 3  . torsemide (DEMADEX) 20 MG tablet Take two tablets  (40 mg) by mouth twice daily 360 tablet 0  . venlafaxine XR (EFFEXOR-XR) 150 MG 24 hr capsule Take 150 mg by mouth daily with breakfast.    . vitamin B-12 (CYANOCOBALAMIN) 1000 MCG tablet Take 6,000 mcg by mouth daily.    Marland Kitchen zolpidem (AMBIEN) 10 MG tablet Take 5 mg by mouth at bedtime as needed. For sleep     No current facility-administered medications for this visit.     Allergies  Allergen Reactions  . Morphine And Related Other (See Comments)    Patient required emergent use of naloxone and naloxone drip in ICU for only taking Percocet 5-325 over the course of one day. She appears to have impaired metabolism of opioid medications and providers should use these medications with extreme caution.  Marland Kitchen Percocet [Oxycodone-Acetaminophen] Shortness Of Breath    Hospitalized  . Oxycodone Other (See Comments)    Mental status changes    Review of Systems negative except from HPI and PMH  Physical Exam BP 111/69   Pulse 98   Ht 5' (1.524 m)   Wt 192 lb 9.6 oz (87.4 kg)   SpO2 98%   BMI 37.61 kg/m  Well developed and nourished in no acute distress HENT normal Neck supple with JVP-flat Clear Regular rate and rhythm, no murmurs or gallops Abd-soft with active BS No Clubbing cyanosis no edema Skin-warm and dry A & Oriented  Grossly normal sensory and motor function  ECG demonstrates P. Synchronous pacing with a biventricular configuration Heart rate 98  Assessment and  Plan  Nonischemic cardiomyopathy  Congestive heart failure-chronic systolic grade 2  Defibrillator lead failure with prior extraction  Implantable defibrillator-CRT-Medtronic  The patient's device was interrogated.  The information was reviewed. No changes were made in the programming.    Hypokalemia  Relative sinus tachycardia  She remains much improved. She is relative sinus tachycardia. She is euvolemic. We will increase her carvedilol to 18.75. Her labs are followed by her PCP. I will ask to make sure her  thyroid levels checked.

## 2016-04-07 ENCOUNTER — Encounter: Payer: Self-pay | Admitting: Internal Medicine

## 2016-04-27 ENCOUNTER — Other Ambulatory Visit: Payer: Self-pay | Admitting: Internal Medicine

## 2016-05-07 ENCOUNTER — Encounter: Payer: Self-pay | Admitting: Internal Medicine

## 2016-06-29 ENCOUNTER — Other Ambulatory Visit (HOSPITAL_COMMUNITY): Payer: Self-pay | Admitting: Internal Medicine

## 2016-06-29 DIAGNOSIS — Z4502 Encounter for adjustment and management of automatic implantable cardiac defibrillator: Secondary | ICD-10-CM

## 2016-07-06 ENCOUNTER — Ambulatory Visit (INDEPENDENT_AMBULATORY_CARE_PROVIDER_SITE_OTHER): Payer: Medicare PPO | Admitting: *Deleted

## 2016-07-06 DIAGNOSIS — I428 Other cardiomyopathies: Secondary | ICD-10-CM

## 2016-07-06 NOTE — Progress Notes (Signed)
Remote ICD transmission.   

## 2016-07-07 ENCOUNTER — Other Ambulatory Visit (HOSPITAL_COMMUNITY): Payer: Self-pay | Admitting: Internal Medicine

## 2016-07-09 ENCOUNTER — Encounter (HOSPITAL_COMMUNITY): Payer: Self-pay | Admitting: Internal Medicine

## 2016-07-09 ENCOUNTER — Ambulatory Visit (HOSPITAL_COMMUNITY)
Admission: RE | Admit: 2016-07-09 | Discharge: 2016-07-09 | Disposition: A | Discharge status: Home / Self Care | Payer: Medicare PPO | Source: Ambulatory Visit | Attending: Internal Medicine | Admitting: Internal Medicine

## 2016-07-09 VITALS — BP 110/68 | HR 81 | Wt 199.5 lb

## 2016-07-09 DIAGNOSIS — R0602 Shortness of breath: Secondary | ICD-10-CM | POA: Diagnosis not present

## 2016-07-09 DIAGNOSIS — Z853 Personal history of malignant neoplasm of breast: Secondary | ICD-10-CM | POA: Insufficient documentation

## 2016-07-09 DIAGNOSIS — M109 Gout, unspecified: Secondary | ICD-10-CM | POA: Diagnosis not present

## 2016-07-09 DIAGNOSIS — E785 Hyperlipidemia, unspecified: Secondary | ICD-10-CM | POA: Insufficient documentation

## 2016-07-09 DIAGNOSIS — Z79899 Other long term (current) drug therapy: Secondary | ICD-10-CM | POA: Diagnosis not present

## 2016-07-09 DIAGNOSIS — Z8619 Personal history of other infectious and parasitic diseases: Secondary | ICD-10-CM | POA: Insufficient documentation

## 2016-07-09 DIAGNOSIS — I429 Cardiomyopathy, unspecified: Secondary | ICD-10-CM | POA: Diagnosis not present

## 2016-07-09 DIAGNOSIS — I480 Paroxysmal atrial fibrillation: Secondary | ICD-10-CM

## 2016-07-09 DIAGNOSIS — E669 Obesity, unspecified: Secondary | ICD-10-CM | POA: Insufficient documentation

## 2016-07-09 DIAGNOSIS — Z95810 Presence of automatic (implantable) cardiac defibrillator: Secondary | ICD-10-CM | POA: Insufficient documentation

## 2016-07-09 DIAGNOSIS — I5032 Chronic diastolic (congestive) heart failure: Secondary | ICD-10-CM | POA: Diagnosis not present

## 2016-07-09 DIAGNOSIS — K219 Gastro-esophageal reflux disease without esophagitis: Secondary | ICD-10-CM | POA: Diagnosis not present

## 2016-07-09 DIAGNOSIS — I5022 Chronic systolic (congestive) heart failure: Secondary | ICD-10-CM

## 2016-07-09 MED ORDER — POTASSIUM CHLORIDE CRYS ER 20 MEQ PO TBCR: 20.0000 meq | EXTENDED_RELEASE_TABLET | Freq: Two times a day (BID) | ORAL | 3 refills | Status: DC

## 2016-07-09 MED ORDER — METOLAZONE 2.5 MG PO TABS: 2.5000 mg | ORAL_TABLET | ORAL | 3 refills | Status: DC

## 2016-07-09 NOTE — Progress Notes (Signed)
Patient ID: [name], female   DOB: 30-Sep-1955, 60 y.o.   MRN: [id number] Patient ID: [name], female   DOB: July 04, 1956, 60 y.o.   MRN: [id number]   [facility] CLINIC NOTE  PCP: Dr Raeanne Gathers  HPI: [name] is a 60 y/o woman with history of breast CA in 1998 treated with Adriamycin x 4 cycles. In 2007, diagnosed with CHF with EF 25-35%. She is s/p Medtronic BiV-ICD in 2008 with Sprint Fidelis lead. LV function has subsequently recovered  Had cath 12/10. Coronaries normal. Right atrial pressure mean of 7, RV pressure 28/6 . Pulmonary artery pressure was 23/9 with mean of 15.  Pulmonary capillary wedge pressure mean of 9. Fick cardiac output 3.9 L per minute.  Cardiac index 2.2 L per minute per meter squared.  Pulmonary vascular resistance was 1.5 [place] units.  Follow up: Feeling good. Over past month has noted more episodes of DOE. Also notices periods of orthopnea/PND. Taking all meds as directed. Taking metolazone as needed with good response. Weight up about 5 pounds as well. Has cut salt way back. Drinking a lot of lemon vitamin water.   ICD interrogation. No VT/AF. 100% biv pacing. Activity level 4 hours fluid up and down. (down today)    Echo 5/11: EF 50-55%  12/15/11 ECHO EF 50%. RV normal. 12/16/11 ICD generator replaced due to EOL 04/2013 EF 50% 07/19/14 Echo EF 50% inferior HK GLS -17.8 6/16 Echo EF 45-50   ROS: All systems negative except as listed in HPI, PMH and Problem List.  Past Medical History:  Diagnosis Date  . AICD (automatic cardioverter/defibrillator) present   . Biventricular implantable cardiac defibrillator)    Medtronic  . Breast cancer ([place])    s/p mastectomy and chemo with adriamycin (1998)  . CHF (congestive heart failure) (HCC)    takes Lasix and Aldactone daily  . GERD (gastroesophageal reflux disease)    takes Omeprazole daily  . Gout    takes Allopurinol daily  . History of bronchitis 2 yrs ago  . History of shingles   .  Hyperlipidemia    takes Simvastatin daily  . Insomnia    takes Ambien nightly  . Nonischemic cardiomyopathy ([place])    EF 55% echo 2012  . NSVT (nonsustained ventricular tachycardia) ([place])   . Obesity   . Pneumonia 41yrs ago  . Presence of permanent cardiac pacemaker   . Shortness of breath dyspnea    with exertion  . sprint Fidelis     Current Outpatient Prescriptions  Medication Sig Dispense Refill  . carvedilol (COREG) 12.5 MG tablet Take 18.75 mg by mouth 2 (two) times daily with a meal.    . Cholecalciferol (VITAMIN D3) 5000 units TABS Take 1 tablet by mouth daily as needed (vitamin supplement).     . citalopram (CELEXA) 40 MG tablet Take 20 mg by mouth daily.    Marland Kitchen losartan (COZAAR) 50 MG tablet TAKE 1 TABLET EVERY DAY 90 tablet 3  . multivitamin-iron-minerals-folic acid (THERAPEUTIC-M) TABS tablet Take 1 tablet by mouth daily.    Marland Kitchen omeprazole (PRILOSEC) 20 MG capsule Take 20 mg by mouth daily as needed.    . potassium chloride SA (K-DUR,KLOR-CON) 20 MEQ tablet Take 20 mEq by mouth 2 (two) times daily.     . ranitidine (ZANTAC 150 MAXIMUM STRENGTH) 150 MG tablet Take 150 mg by mouth 2 (two) times daily.    . simvastatin (ZOCOR) 40 MG tablet TAKE 1 TABLET EVERY EVENING 90 tablet 6  .  spironolactone (ALDACTONE) 25 MG tablet TAKE 1/2 TABLET  (12.5  MG  TOTAL) EVERY DAY 45 tablet 3  . torsemide (DEMADEX) 20 MG tablet Take 2 tablets (40 mg total) by mouth 2 (two) times daily. 360 tablet 3  . venlafaxine XR (EFFEXOR-XR) 150 MG 24 hr capsule Take 150 mg by mouth daily with breakfast.    . vitamin B-12 (CYANOCOBALAMIN) 1000 MCG tablet Take 6,000 mcg by mouth daily.    Marland Kitchen zolpidem (AMBIEN) 10 MG tablet Take 5 mg by mouth at bedtime as needed. For sleep    . metolazone (ZAROXOLYN) 2.5 MG tablet TAKE 1 TABLET EVERY DAY AS NEEDED FOR WEIGHT GAIN OF 3 POUNDS OR MORE OVERNIGHT (Patient not taking: Reported on 07/09/2016) 90 tablet 3   No current facility-administered medications for this  encounter.    PHYSICAL EXAM: Vitals:   07/09/16 1017  BP: 110/68  Pulse: 81  SpO2: 99%  Weight: 199 lb 8 oz (90.5 kg)   Wt Readings from Last 3 Encounters:  07/09/16 199 lb 8 oz (90.5 kg)  04/06/16 192 lb 9.6 oz (87.4 kg)  01/16/16 195 lb 4 oz (88.6 kg)    General:  Well appearing. No resp difficulty HEENT: normal Neck: supple. JVP  ~7-8 Carotids 2+ bilaterally; no bruits. No lymphadenopathy or thryomegaly appreciated. Cor: PMI normal. Regular rate & rhythm. No rubs, gallops or murmurs. ICD wound well healed  Lungs: clear Abdomen: soft, nontender, nondistended. No hepatosplenomegaly. No bruits or masses. Good bowel sounds. Extremities: no cyanosis, clubbing, rash, trace lower extremity edema.  Neuro: alert & orientedx3, cranial nerves grossly intact. Moves all 4 extremities w/o difficulty. Affect pleasant.  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 - Slightly worse today with NYHA II-III symptoms. Volume status ok today on exam and by Optivol but has been cycling cycling up and down. Will use metolazone 2.5 once a week scheduled (Fridays) with kcl 20 - Continue current Coreg, losartan, and spironolactone.  BP has been generally too soft to handle titration (SBP runs in 90s at home) - Time for repeat echo.  2) Medtronic BiV ICD: s/p change out. Interrogated personally in clinic today   Natasha Bickers MD  07/09/2016 10:48 AM

## 2016-07-09 NOTE — Patient Instructions (Signed)
Take Metolazone 2.5 mg every Friday  Take an extra Potassium 20 meq when you Metolazone  Labs in 2 weeks  Your physician has requested that you have an echocardiogram. Echocardiography is a painless test that uses sound waves to create images of your heart. It provides your doctor with information about the size and shape of your heart and how well your heart's chambers and valves are working. This procedure takes approximately one hour. There are no restrictions for this procedure.  We will contact you in 3 months to schedule your next appointment.

## 2016-07-09 NOTE — Addendum Note (Signed)
Encounter addended by: Scarlette Calico, RN on: 07/09/2016 11:11 AM<BR>    Actions taken: Visit diagnoses modified, Order Entry activity accessed, Diagnosis association updated, Sign clinical note

## 2016-07-09 NOTE — Addendum Note (Signed)
Encounter addended by: Harvie Junior, CMA on: 07/09/2016 11:09 AM<BR>    Actions taken: Visit Navigator Flowsheet section accepted

## 2016-07-10 ENCOUNTER — Encounter: Payer: Self-pay | Admitting: Cardiology

## 2016-07-21 LAB — CUP PACEART REMOTE DEVICE CHECK
Battery Remaining Longevity: 85 mo
Battery Voltage: 2.98 V
Brady Statistic AP VP Percent: 8.07 %
Brady Statistic AP VS Percent: 0.01 %
Brady Statistic AS VS Percent: 0.06 %
Brady Statistic RA Percent Paced: 8.08 %
Brady Statistic RV Percent Paced: 99.43 %
Date Time Interrogation Session: 20171030052403
HighPow Impedance: 83 Ohm
Implantable Lead Implant Date: 20070821
Implantable Lead Implant Date: 20160211
Implantable Lead Location: 753858
Implantable Lead Model: 5076
Implantable Pulse Generator Implant Date: 20160211
Lead Channel Impedance Value: 456 Ohm
Lead Channel Impedance Value: 627 Ohm
Lead Channel Impedance Value: 646 Ohm
Lead Channel Pacing Threshold Amplitude: 0.5 V
Lead Channel Pacing Threshold Pulse Width: 0.4 ms
Lead Channel Pacing Threshold Pulse Width: 0.4 ms
Lead Channel Sensing Intrinsic Amplitude: 17.25 mV
Lead Channel Sensing Intrinsic Amplitude: 17.25 mV
Lead Channel Sensing Intrinsic Amplitude: 2.375 mV
Lead Channel Setting Pacing Amplitude: 1.5 V
Lead Channel Setting Pacing Amplitude: 1.75 V
Lead Channel Setting Pacing Amplitude: 2.25 V
Lead Channel Setting Pacing Pulse Width: 0.4 ms
Lead Channel Setting Pacing Pulse Width: 0.4 ms
MDC IDC LEAD IMPLANT DT: 20070821
MDC IDC LEAD LOCATION: 753859
MDC IDC LEAD LOCATION: 753860
MDC IDC LEAD MODEL: 4194
MDC IDC LEAD MODEL: 6935
MDC IDC MSMT LEADCHNL LV IMPEDANCE VALUE: 171 Ohm
MDC IDC MSMT LEADCHNL LV IMPEDANCE VALUE: 703 Ohm
MDC IDC MSMT LEADCHNL RA IMPEDANCE VALUE: 342 Ohm
MDC IDC MSMT LEADCHNL RA PACING THRESHOLD AMPLITUDE: 1.125 V
MDC IDC MSMT LEADCHNL RA SENSING INTR AMPL: 2.375 mV
MDC IDC MSMT LEADCHNL RV PACING THRESHOLD AMPLITUDE: 1.5 V
MDC IDC MSMT LEADCHNL RV PACING THRESHOLD PULSEWIDTH: 0.4 ms
MDC IDC SET LEADCHNL RV SENSING SENSITIVITY: 0.3 mV
MDC IDC STAT BRADY AS VP PERCENT: 91.86 %

## 2016-07-22 ENCOUNTER — Encounter: Payer: Self-pay | Admitting: Internal Medicine

## 2016-07-24 ENCOUNTER — Ambulatory Visit (HOSPITAL_COMMUNITY)
Admission: RE | Admit: 2016-07-24 | Discharge: 2016-07-24 | Disposition: A | Discharge status: Home / Self Care | Payer: Medicare PPO | Source: Ambulatory Visit | Attending: Family Medicine | Admitting: Family Medicine

## 2016-07-24 ENCOUNTER — Ambulatory Visit (HOSPITAL_BASED_OUTPATIENT_CLINIC_OR_DEPARTMENT_OTHER)
Admission: RE | Admit: 2016-07-24 | Discharge: 2016-07-24 | Disposition: A | Discharge status: Home / Self Care | Payer: Medicare PPO | Source: Ambulatory Visit | Attending: Cardiology | Admitting: Cardiology

## 2016-07-24 DIAGNOSIS — I5022 Chronic systolic (congestive) heart failure: Secondary | ICD-10-CM | POA: Diagnosis present

## 2016-07-24 LAB — BASIC METABOLIC PANEL
Anion gap: 11 (ref 5–15)
BUN: 19 mg/dL (ref 6–20)
CALCIUM: 10.3 mg/dL (ref 8.9–10.3)
CO2: 32 mmol/L (ref 22–32)
CREATININE: 1.09 mg/dL — AB (ref 0.44–1.00)
Chloride: 91 mmol/L — ABNORMAL LOW (ref 101–111)
GFR, EST NON AFRICAN AMERICAN: 53 mL/min — AB (ref >60)
Glucose, Bld: 106 mg/dL — ABNORMAL HIGH (ref 65–99)
Potassium: 3.5 mmol/L (ref 3.5–5.1)
SODIUM: 134 mmol/L — AB (ref 135–145)

## 2016-07-24 NOTE — Progress Notes (Signed)
  Echocardiogram 2D Echocardiogram has been performed.  Tresa Res 07/24/2016, 9:50 AM

## 2016-10-05 ENCOUNTER — Ambulatory Visit (INDEPENDENT_AMBULATORY_CARE_PROVIDER_SITE_OTHER): Payer: Medicare PPO | Admitting: *Deleted

## 2016-10-05 DIAGNOSIS — I428 Other cardiomyopathies: Secondary | ICD-10-CM | POA: Diagnosis not present

## 2016-10-05 NOTE — Progress Notes (Signed)
Remote ICD transmission.   

## 2016-10-06 ENCOUNTER — Encounter: Payer: Self-pay | Admitting: Cardiology

## 2016-10-06 LAB — CUP PACEART REMOTE DEVICE CHECK
Battery Remaining Longevity: 80 mo
Brady Statistic AP VS Percent: 0.01 %
Brady Statistic AS VS Percent: 0.13 %
Brady Statistic RA Percent Paced: 6.06 %
HighPow Impedance: 85 Ohm
Implantable Lead Implant Date: 20070821
Implantable Lead Implant Date: 20160211
Implantable Lead Location: 753858
Implantable Lead Location: 753859
Implantable Lead Model: 4194
Implantable Pulse Generator Implant Date: 20160211
Lead Channel Impedance Value: 171 Ohm
Lead Channel Pacing Threshold Pulse Width: 0.4 ms
Lead Channel Pacing Threshold Pulse Width: 0.4 ms
Lead Channel Pacing Threshold Pulse Width: 0.4 ms
Lead Channel Sensing Intrinsic Amplitude: 18.875 mV
Lead Channel Sensing Intrinsic Amplitude: 18.875 mV
Lead Channel Setting Pacing Amplitude: 1.5 V
Lead Channel Setting Pacing Amplitude: 2 V
Lead Channel Setting Pacing Pulse Width: 0.4 ms
Lead Channel Setting Sensing Sensitivity: 0.3 mV
MDC IDC LEAD IMPLANT DT: 20070821
MDC IDC LEAD LOCATION: 753860
MDC IDC MSMT BATTERY VOLTAGE: 2.98 V
MDC IDC MSMT LEADCHNL LV IMPEDANCE VALUE: 627 Ohm
MDC IDC MSMT LEADCHNL LV IMPEDANCE VALUE: 665 Ohm
MDC IDC MSMT LEADCHNL LV PACING THRESHOLD AMPLITUDE: 0.5 V
MDC IDC MSMT LEADCHNL RA IMPEDANCE VALUE: 342 Ohm
MDC IDC MSMT LEADCHNL RA PACING THRESHOLD AMPLITUDE: 1 V
MDC IDC MSMT LEADCHNL RA SENSING INTR AMPL: 2.125 mV
MDC IDC MSMT LEADCHNL RA SENSING INTR AMPL: 2.125 mV
MDC IDC MSMT LEADCHNL RV IMPEDANCE VALUE: 456 Ohm
MDC IDC MSMT LEADCHNL RV IMPEDANCE VALUE: 627 Ohm
MDC IDC MSMT LEADCHNL RV PACING THRESHOLD AMPLITUDE: 1.375 V
MDC IDC SESS DTM: 20180129062403
MDC IDC SET LEADCHNL LV PACING AMPLITUDE: 1.5 V
MDC IDC SET LEADCHNL LV PACING PULSEWIDTH: 0.4 ms
MDC IDC STAT BRADY AP VP PERCENT: 6.09 %
MDC IDC STAT BRADY AS VP PERCENT: 93.77 %
MDC IDC STAT BRADY RV PERCENT PACED: 98.9 %

## 2016-12-18 ENCOUNTER — Other Ambulatory Visit (HOSPITAL_COMMUNITY): Payer: Self-pay | Admitting: Internal Medicine

## 2017-01-04 ENCOUNTER — Ambulatory Visit (INDEPENDENT_AMBULATORY_CARE_PROVIDER_SITE_OTHER): Payer: Medicare PPO | Admitting: *Deleted

## 2017-01-04 DIAGNOSIS — I428 Other cardiomyopathies: Secondary | ICD-10-CM | POA: Diagnosis not present

## 2017-01-04 NOTE — Progress Notes (Signed)
Remote ICD transmission.   

## 2017-01-05 ENCOUNTER — Encounter: Payer: Self-pay | Admitting: Cardiology

## 2017-01-05 LAB — CUP PACEART REMOTE DEVICE CHECK
Battery Remaining Longevity: 75 mo
Battery Voltage: 2.98 V
Brady Statistic AP VP Percent: 9.88 %
Brady Statistic AP VS Percent: 0.02 %
Brady Statistic AS VP Percent: 89.86 %
Brady Statistic AS VS Percent: 0.24 %
Brady Statistic RA Percent Paced: 9.74 %
Brady Statistic RV Percent Paced: 97.81 %
Date Time Interrogation Session: 20180430052204
HighPow Impedance: 76 Ohm
Implantable Lead Implant Date: 20070821
Implantable Lead Implant Date: 20070821
Implantable Lead Implant Date: 20160211
Implantable Lead Location: 753858
Implantable Lead Location: 753859
Implantable Lead Location: 753860
Implantable Lead Model: 4194
Implantable Lead Model: 5076
Implantable Lead Model: 6935
Implantable Pulse Generator Implant Date: 20160211
Lead Channel Impedance Value: 133 Ohm
Lead Channel Impedance Value: 380 Ohm
Lead Channel Impedance Value: 456 Ohm
Lead Channel Impedance Value: 589 Ohm
Lead Channel Impedance Value: 627 Ohm
Lead Channel Impedance Value: 646 Ohm
Lead Channel Pacing Threshold Amplitude: 0.5 V
Lead Channel Pacing Threshold Amplitude: 1.125 V
Lead Channel Pacing Threshold Amplitude: 1.375 V
Lead Channel Pacing Threshold Pulse Width: 0.4 ms
Lead Channel Pacing Threshold Pulse Width: 0.4 ms
Lead Channel Pacing Threshold Pulse Width: 0.4 ms
Lead Channel Sensing Intrinsic Amplitude: 1.875 mV
Lead Channel Sensing Intrinsic Amplitude: 1.875 mV
Lead Channel Sensing Intrinsic Amplitude: 17.75 mV
Lead Channel Sensing Intrinsic Amplitude: 17.75 mV
Lead Channel Setting Pacing Amplitude: 1.5 V
Lead Channel Setting Pacing Amplitude: 1.75 V
Lead Channel Setting Pacing Amplitude: 2 V
Lead Channel Setting Pacing Pulse Width: 0.4 ms
Lead Channel Setting Pacing Pulse Width: 0.4 ms
Lead Channel Setting Sensing Sensitivity: 0.3 mV

## 2017-01-11 ENCOUNTER — Encounter: Payer: Self-pay | Admitting: Internal Medicine

## 2017-01-11 NOTE — Telephone Encounter (Signed)
Spoke with [name] about there remote transmission, explained to her what AT/AF was and that it more episodes occurred, Dr. Caryl Comes would discuss with her in July about treatment. Pt voiced understanding.

## 2017-04-05 ENCOUNTER — Ambulatory Visit (INDEPENDENT_AMBULATORY_CARE_PROVIDER_SITE_OTHER): Payer: Medicare PPO | Admitting: *Deleted

## 2017-04-05 DIAGNOSIS — I428 Other cardiomyopathies: Secondary | ICD-10-CM | POA: Diagnosis not present

## 2017-04-05 NOTE — Progress Notes (Signed)
Remote ICD transmission.   

## 2017-04-07 ENCOUNTER — Encounter: Payer: Self-pay | Admitting: Internal Medicine

## 2017-04-07 ENCOUNTER — Ambulatory Visit (INDEPENDENT_AMBULATORY_CARE_PROVIDER_SITE_OTHER): Payer: Medicare PPO | Admitting: Internal Medicine

## 2017-04-07 VITALS — BP 100/62 | HR 84 | Ht 61.0 in | Wt 200.0 lb

## 2017-04-07 DIAGNOSIS — I428 Other cardiomyopathies: Secondary | ICD-10-CM | POA: Diagnosis not present

## 2017-04-07 DIAGNOSIS — I5022 Chronic systolic (congestive) heart failure: Secondary | ICD-10-CM | POA: Diagnosis not present

## 2017-04-07 DIAGNOSIS — Z95810 Presence of automatic (implantable) cardiac defibrillator: Secondary | ICD-10-CM

## 2017-04-07 NOTE — Patient Instructions (Signed)
Medication Instructions: -Your physician recommends that you continue on your current medications as directed. Please refer to the Current Medication list given to you today.  Labwork: -None Ordered  Procedures/Testing: -None Ordered  Follow-Up: - Your physician wants you to follow-up in 1 YEAR with Chanetta Marshall, NP. You will receive a reminder letter in the mail two months in advance. If you don't receive a letter, please call our office to schedule the follow-up appointment.  - Remote monitoring is used to monitor your ICD from home. This monitoring reduces the number of office visits required to check your device to one time per year. It allows [place] to keep an eye on the functioning of your device to ensure it is working properly. You are scheduled for a device check from home on 07/05/17. You may send your transmission at any time that day. If you have a wireless device, the transmission will be sent automatically. After your physician reviews your transmission, you will receive a postcard with your next transmission date.     Any Additional Special Instructions Will Be Listed Below (If Applicable).     If you need a refill on your cardiac medications before your next appointment, please call your pharmacy.

## 2017-04-07 NOTE — Progress Notes (Signed)
Patient Care Team: System, Pcp Not In as PCP - General   HPI  [name] is a 61 y.o. female seen today in follow-up for congestive heart failure in the setting of nonischemic Gcardiomyopathy. She is status post CRTD implantation about 4 years ago.She has a sprint Fidelis lead she reached ERI in Aprill 2013  We reversed the rate sense portion of the RV and LV leads   In 2/16 she fractured her lead and underwent extraction and ICD lead reimplantation She has tolerated this without difficulty.   She underwent catheterization 12/10 , coronaries were normal and filling pressures were normal  Nuke study in 2007 normal perfusion by report. Echo 5/12 EF remains in the 55-60% range  most recent echo 11/15 had an EF of 45%.   Medical history notable for breast CA in 1998 treated with Adriamycin x 4 cycles.   The patient denies chest pain, shortness of breath, nocturnal dyspnea, orthopnea or peripheral edema.  There have been no palpitations, lightheadedness or syncope.   Date Cr K Mg  11/17  1.09 3.5           Past Medical History:  Diagnosis Date  . AICD (automatic cardioverter/defibrillator) present   . Biventricular implantable cardiac defibrillator)    Medtronic  . Breast cancer ([place])    s/p mastectomy and chemo with adriamycin (1998)  . CHF (congestive heart failure) (HCC)    takes Lasix and Aldactone daily  . GERD (gastroesophageal reflux disease)    takes Omeprazole daily  . Gout    takes Allopurinol daily  . History of bronchitis 2 yrs ago  . History of shingles   . Hyperlipidemia    takes Simvastatin daily  . Insomnia    takes Ambien nightly  . Nonischemic cardiomyopathy ([place])    EF 55% echo 2012  . NSVT (nonsustained ventricular tachycardia) ([place])   . Obesity   . Pneumonia 30yrs ago  . Presence of permanent cardiac pacemaker   . Shortness of breath dyspnea    with exertion  . sprint Fidelis     Past Surgical History:  Procedure Laterality Date  .  ABDOMINAL HYSTERECTOMY    . APPENDECTOMY  1978  . BiV ICD  2007   Medtronic  . BIV ICD GENERTAOR CHANGE OUT N/A 12/16/2011   Procedure: BIV ICD GENERTAOR CHANGE OUT;  Surgeon: [name], MD;  Location: [facility] CATH LAB;  Service: Cardiovascular;  Laterality: N/A;  . COLONOSCOPY    . cyst removed from wrist Left    early 70's  . ICD LEAD REMOVAL Right 10/18/2014   Procedure: ICD LEAD REMOVAL/EXTRACTION/REIMPLANTATION;  Surgeon: Evans Lance, MD;  Location: [place];  Service: Cardiovascular;  Laterality: Right;  . MASTECTOMY Left 1998  . port a cath placed  1998  . port removed  1998    Current Outpatient Prescriptions  Medication Sig Dispense Refill  . carvedilol (COREG) 12.5 MG tablet Take 18.75 mg by mouth 2 (two) times daily with a meal.    . Cholecalciferol (VITAMIN D3) 5000 units TABS Take 1 tablet by mouth daily as needed (vitamin supplement).     . citalopram (CELEXA) 40 MG tablet Take 20 mg by mouth daily.    Marland Kitchen losartan (COZAAR) 50 MG tablet TAKE 1 TABLET EVERY DAY 90 tablet 3  . metolazone (ZAROXOLYN) 2.5 MG tablet Take 1 tablet (2.5 mg total) by mouth once a week. Every Friday 5 tablet 3  . multivitamin-iron-minerals-folic acid (THERAPEUTIC-M) TABS tablet Take 1  tablet by mouth daily.    Marland Kitchen omeprazole (PRILOSEC) 20 MG capsule Take 20 mg by mouth daily as needed.    . potassium chloride SA (K-DUR,KLOR-CON) 20 MEQ tablet Take 1 tablet (20 mEq total) by mouth 2 (two) times daily. Take 1 extra tab on Fridays with Metolazone 65 tablet 3  . ranitidine (ZANTAC 150 MAXIMUM STRENGTH) 150 MG tablet Take 150 mg by mouth 2 (two) times daily.    . simvastatin (ZOCOR) 40 MG tablet TAKE 1 TABLET EVERY EVENING 90 tablet 6  . spironolactone (ALDACTONE) 25 MG tablet TAKE 1/2 TABLET  (12.5  MG  TOTAL) EVERY DAY 45 tablet 3  . torsemide (DEMADEX) 20 MG tablet Take 2 tablets (40 mg total) by mouth 2 (two) times daily. 360 tablet 3  . venlafaxine XR (EFFEXOR-XR) 150 MG 24 hr capsule Take 150 mg by  mouth daily with breakfast.    . vitamin B-12 (CYANOCOBALAMIN) 1000 MCG tablet Take 6,000 mcg by mouth daily.    Marland Kitchen zolpidem (AMBIEN) 10 MG tablet Take 5 mg by mouth at bedtime as needed. For sleep     No current facility-administered medications for this visit.     Allergies  Allergen Reactions  . Morphine And Related Other (See Comments)    Patient required emergent use of naloxone and naloxone drip in ICU for only taking Percocet 5-325 over the course of one day. She appears to have impaired metabolism of opioid medications and providers should use these medications with extreme caution.  Marland Kitchen Percocet [Oxycodone-Acetaminophen] Shortness Of Breath    Hospitalized  . Oxycodone Other (See Comments)    Mental status changes    Review of Systems negative except from HPI and PMH  Physical Exam BP 100/62   Pulse 84   Ht 5\' 1"  (1.549 m)   Wt 200 lb (90.7 kg)   SpO2 96%   BMI 37.79 kg/m  Well developed and nourished in no acute distress HENT normal Neck supple with JVP-flat Carotids brisk and full without bruits Clear Regular rate and rhythm, no murmurs or gallops Abd-soft with active BS without hepatomegaly No Clubbing cyanosis edema Skin-warm and dry A & Oriented  Grossly normal sensory and motor function   ECG demonstrates P. Synchronous pacing with a biventricular configuration Heart rate 78  Assessment and  Plan  Nonischemic cardiomyopathy  Congestive heart failure-chronic systolic grade 2  Defibrillator lead failure with prior extraction  Potassium low  Implantable defibrillator-CRT-Medtronic  The patient's device was interrogated.  The information was reviewed. No changes were made in the programming.      She is Improved.  She is on maximal guideline directed medical therapy limited by hypotension. Hence, she is not on an ARB.  She is euvolemic.  We'll check a metabolic profile given her tendency toward low potassium.

## 2017-04-08 LAB — CUP PACEART REMOTE DEVICE CHECK
Battery Voltage: 2.98 V
Brady Statistic AP VP Percent: 7.56 %
Brady Statistic AP VS Percent: 0.01 %
Brady Statistic AS VS Percent: 0.13 %
Brady Statistic RV Percent Paced: 98.88 %
HighPow Impedance: 64 Ohm
Implantable Lead Implant Date: 20070821
Implantable Lead Implant Date: 20160211
Implantable Lead Location: 753858
Implantable Lead Model: 5076
Implantable Pulse Generator Implant Date: 20160211
Lead Channel Impedance Value: 171 Ohm
Lead Channel Impedance Value: 399 Ohm
Lead Channel Impedance Value: 589 Ohm
Lead Channel Pacing Threshold Amplitude: 0.5 V
Lead Channel Pacing Threshold Pulse Width: 0.4 ms
Lead Channel Pacing Threshold Pulse Width: 0.4 ms
Lead Channel Sensing Intrinsic Amplitude: 17.125 mV
Lead Channel Sensing Intrinsic Amplitude: 2 mV
Lead Channel Setting Pacing Amplitude: 1.75 V
Lead Channel Setting Pacing Amplitude: 2 V
Lead Channel Setting Pacing Pulse Width: 0.4 ms
Lead Channel Setting Pacing Pulse Width: 0.4 ms
MDC IDC LEAD IMPLANT DT: 20070821
MDC IDC LEAD LOCATION: 753859
MDC IDC LEAD LOCATION: 753860
MDC IDC MSMT BATTERY REMAINING LONGEVITY: 69 mo
MDC IDC MSMT LEADCHNL LV IMPEDANCE VALUE: 570 Ohm
MDC IDC MSMT LEADCHNL LV IMPEDANCE VALUE: 646 Ohm
MDC IDC MSMT LEADCHNL RA IMPEDANCE VALUE: 342 Ohm
MDC IDC MSMT LEADCHNL RA PACING THRESHOLD AMPLITUDE: 1.125 V
MDC IDC MSMT LEADCHNL RA SENSING INTR AMPL: 2 mV
MDC IDC MSMT LEADCHNL RV PACING THRESHOLD AMPLITUDE: 1.375 V
MDC IDC MSMT LEADCHNL RV PACING THRESHOLD PULSEWIDTH: 0.4 ms
MDC IDC MSMT LEADCHNL RV SENSING INTR AMPL: 17.125 mV
MDC IDC SESS DTM: 20180730073623
MDC IDC SET LEADCHNL LV PACING AMPLITUDE: 1.5 V
MDC IDC SET LEADCHNL RV SENSING SENSITIVITY: 0.3 mV
MDC IDC STAT BRADY AS VP PERCENT: 92.3 %
MDC IDC STAT BRADY RA PERCENT PACED: 7.51 %

## 2017-04-09 ENCOUNTER — Encounter: Payer: Self-pay | Admitting: Cardiology

## 2017-04-13 ENCOUNTER — Telehealth: Payer: Self-pay | Admitting: Internal Medicine

## 2017-04-13 NOTE — Telephone Encounter (Signed)
New message    Pt states that she looked on my Chart and it states her EKG came back abnormal and she wants someone to explain the results to her because she does not understand. She requests a call back.

## 2017-04-13 NOTE — Telephone Encounter (Signed)
To SK to review.

## 2017-04-15 NOTE — Telephone Encounter (Signed)
Per Dr. Caryl Comes- he called and left the patient a message on her voice mail that her echo read abnormal due to pacing.

## 2017-05-24 LAB — CUP PACEART INCLINIC DEVICE CHECK
Battery Voltage: 2.97 V
Brady Statistic AP VS Percent: 0.01 %
Brady Statistic RA Percent Paced: 7.95 %
Brady Statistic RV Percent Paced: 98.76 %
HighPow Impedance: 70 Ohm
Implantable Lead Implant Date: 20070821
Implantable Lead Implant Date: 20070821
Implantable Lead Implant Date: 20160211
Implantable Lead Location: 753858
Implantable Lead Location: 753860
Implantable Lead Model: 6935
Implantable Pulse Generator Implant Date: 20160211
Lead Channel Impedance Value: 171 Ohm
Lead Channel Impedance Value: 342 Ohm
Lead Channel Pacing Threshold Amplitude: 1.125 V
Lead Channel Pacing Threshold Amplitude: 1.375 V
Lead Channel Pacing Threshold Pulse Width: 0.4 ms
Lead Channel Pacing Threshold Pulse Width: 0.4 ms
Lead Channel Sensing Intrinsic Amplitude: 1.125 mV
Lead Channel Sensing Intrinsic Amplitude: 1.75 mV
Lead Channel Sensing Intrinsic Amplitude: 17.125 mV
Lead Channel Setting Pacing Amplitude: 1.75 V
Lead Channel Setting Pacing Pulse Width: 0.4 ms
Lead Channel Setting Sensing Sensitivity: 0.3 mV
MDC IDC LEAD LOCATION: 753859
MDC IDC MSMT BATTERY REMAINING LONGEVITY: 70 mo
MDC IDC MSMT LEADCHNL LV IMPEDANCE VALUE: 589 Ohm
MDC IDC MSMT LEADCHNL LV IMPEDANCE VALUE: 665 Ohm
MDC IDC MSMT LEADCHNL LV PACING THRESHOLD AMPLITUDE: 0.5 V
MDC IDC MSMT LEADCHNL LV PACING THRESHOLD PULSEWIDTH: 0.4 ms
MDC IDC MSMT LEADCHNL RV IMPEDANCE VALUE: 437 Ohm
MDC IDC MSMT LEADCHNL RV IMPEDANCE VALUE: 627 Ohm
MDC IDC MSMT LEADCHNL RV SENSING INTR AMPL: 15.75 mV
MDC IDC SESS DTM: 20180801144702
MDC IDC SET LEADCHNL LV PACING AMPLITUDE: 1.5 V
MDC IDC SET LEADCHNL RV PACING AMPLITUDE: 2 V
MDC IDC SET LEADCHNL RV PACING PULSEWIDTH: 0.4 ms
MDC IDC STAT BRADY AP VP PERCENT: 8.01 %
MDC IDC STAT BRADY AS VP PERCENT: 91.84 %
MDC IDC STAT BRADY AS VS PERCENT: 0.14 %

## 2017-06-15 ENCOUNTER — Encounter (HOSPITAL_COMMUNITY): Payer: Self-pay | Admitting: Internal Medicine

## 2017-06-15 ENCOUNTER — Ambulatory Visit (HOSPITAL_COMMUNITY)
Admission: RE | Admit: 2017-06-15 | Discharge: 2017-06-15 | Disposition: A | Discharge status: Home / Self Care | Payer: Medicare PPO | Source: Ambulatory Visit | Attending: Internal Medicine | Admitting: Internal Medicine

## 2017-06-15 VITALS — BP 107/76 | HR 89 | Wt 203.0 lb

## 2017-06-15 DIAGNOSIS — Z853 Personal history of malignant neoplasm of breast: Secondary | ICD-10-CM | POA: Diagnosis not present

## 2017-06-15 DIAGNOSIS — I5022 Chronic systolic (congestive) heart failure: Secondary | ICD-10-CM | POA: Diagnosis not present

## 2017-06-15 DIAGNOSIS — Z9889 Other specified postprocedural states: Secondary | ICD-10-CM | POA: Insufficient documentation

## 2017-06-15 DIAGNOSIS — I429 Cardiomyopathy, unspecified: Secondary | ICD-10-CM | POA: Diagnosis not present

## 2017-06-15 DIAGNOSIS — Z9221 Personal history of antineoplastic chemotherapy: Secondary | ICD-10-CM | POA: Diagnosis not present

## 2017-06-15 DIAGNOSIS — K219 Gastro-esophageal reflux disease without esophagitis: Secondary | ICD-10-CM | POA: Diagnosis not present

## 2017-06-15 DIAGNOSIS — M109 Gout, unspecified: Secondary | ICD-10-CM | POA: Insufficient documentation

## 2017-06-15 DIAGNOSIS — Z6838 Body mass index (BMI) 38.0-38.9, adult: Secondary | ICD-10-CM | POA: Diagnosis not present

## 2017-06-15 DIAGNOSIS — Z79899 Other long term (current) drug therapy: Secondary | ICD-10-CM | POA: Insufficient documentation

## 2017-06-15 DIAGNOSIS — Z95810 Presence of automatic (implantable) cardiac defibrillator: Secondary | ICD-10-CM | POA: Diagnosis not present

## 2017-06-15 DIAGNOSIS — G4700 Insomnia, unspecified: Secondary | ICD-10-CM | POA: Diagnosis not present

## 2017-06-15 DIAGNOSIS — E785 Hyperlipidemia, unspecified: Secondary | ICD-10-CM | POA: Insufficient documentation

## 2017-06-15 DIAGNOSIS — E669 Obesity, unspecified: Secondary | ICD-10-CM | POA: Insufficient documentation

## 2017-06-15 DIAGNOSIS — Z9010 Acquired absence of unspecified breast and nipple: Secondary | ICD-10-CM | POA: Insufficient documentation

## 2017-06-15 DIAGNOSIS — Z8619 Personal history of other infectious and parasitic diseases: Secondary | ICD-10-CM | POA: Diagnosis not present

## 2017-06-15 DIAGNOSIS — E876 Hypokalemia: Secondary | ICD-10-CM | POA: Insufficient documentation

## 2017-06-15 DIAGNOSIS — I5032 Chronic diastolic (congestive) heart failure: Secondary | ICD-10-CM | POA: Diagnosis not present

## 2017-06-15 NOTE — Progress Notes (Signed)
Patient ID: LIZANIA BOUCHARD, female   DOB: 1956-09-06, 61 y.o.   MRN: [id number] Patient ID: TORRENCE BRANAGAN, female   DOB: [birth year], 61 y.o.   MRN: [id number]   ADVANCED HE CLINIC NOTE  PCP: Dr Raeanne Gathers  HPI: [name] is a 61 y/o woman with history of breast CA in 1998 treated with Adriamycin x 4 cycles. In 2007, diagnosed with CHF with EF 25-35%. She is s/p Medtronic BiV-ICD in 2008 with Sprint Fidelis lead which was subsequently replaced. LV function has subsequently recovered  Had cath 12/10. Coronaries normal. Right atrial pressure mean of 7, RV pressure 28/6 . Pulmonary artery pressure was 23/9 with mean of 15.  Pulmonary capillary wedge pressure mean of 9. Fick cardiac output 3.9 L per minute.  Cardiac index 2.2 L per minute per meter squared.  Pulmonary vascular resistance was 1.5 Woods units.  Follow up: Feeling pretty good. Occasionally SOB with activity but not too bad. No orthopnea or PND. Weight stable. Taking metoalzone once a week on Fridays. Not taking potassium with it.   Recent labs K 3.7, MG 2.4, Cr 1.05 LFL 68 HDL 45. Uric acid 10.6   ICD interrogation. No VT/AF. 100% biv pacing. Activity level 4 hours. Fluid leveled out   Echo 11/17: EF 50-55%   Echo 5/11: EF 50-55%  12/15/11 ECHO EF 50%. RV normal. 12/16/11 ICD generator replaced due to EOL 04/2013 EF 50% 07/19/14 Echo EF 50% inferior HK GLS -17.8 6/16 Echo EF 45-50   ROS: All systems negative except as listed in HPI, PMH and Problem List.  Past Medical History:  Diagnosis Date  . AICD (automatic cardioverter/defibrillator) present   . Biventricular implantable cardiac defibrillator)    Medtronic  . Breast cancer ([place])    s/p mastectomy and chemo with adriamycin (1998)  . CHF (congestive heart failure) (HCC)    takes Lasix and Aldactone daily  . GERD (gastroesophageal reflux disease)    takes Omeprazole daily  . Gout    takes Allopurinol daily  . History of bronchitis 2 yrs ago  . History of shingles    . Hyperlipidemia    takes Simvastatin daily  . Insomnia    takes Ambien nightly  . Nonischemic cardiomyopathy ([place])    EF 55% echo 2012  . NSVT (nonsustained ventricular tachycardia) ([place])   . Obesity   . Pneumonia 50yrs ago  . Presence of permanent cardiac pacemaker   . Shortness of breath dyspnea    with exertion  . sprint Fidelis     Current Outpatient Prescriptions  Medication Sig Dispense Refill  . carvedilol (COREG) 12.5 MG tablet Take 18.75 mg by mouth 2 (two) times daily with a meal.    . Cholecalciferol (VITAMIN D3) 5000 units TABS Take 1 tablet by mouth daily as needed (vitamin supplement).     . citalopram (CELEXA) 40 MG tablet Take 20 mg by mouth daily.    Marland Kitchen losartan (COZAAR) 50 MG tablet TAKE 1 TABLET EVERY DAY 90 tablet 3  . metolazone (ZAROXOLYN) 2.5 MG tablet Take 1 tablet (2.5 mg total) by mouth once a week. Every Friday 5 tablet 3  . multivitamin-iron-minerals-folic acid (THERAPEUTIC-M) TABS tablet Take 1 tablet by mouth daily.    Marland Kitchen omeprazole (PRILOSEC) 20 MG capsule Take 20 mg by mouth daily as needed.    . potassium chloride SA (K-DUR,KLOR-CON) 20 MEQ tablet Take 1 tablet (20 mEq total) by mouth 2 (two) times daily. Take 1 extra tab on Fridays with Metolazone (Patient taking  differently: Take 20 mEq by mouth 2 (two) times daily. ) 65 tablet 3  . ranitidine (ZANTAC 150 MAXIMUM STRENGTH) 150 MG tablet Take 150 mg by mouth 2 (two) times daily.    . simvastatin (ZOCOR) 40 MG tablet TAKE 1 TABLET EVERY EVENING 90 tablet 6  . spironolactone (ALDACTONE) 25 MG tablet TAKE 1/2 TABLET  (12.5  MG  TOTAL) EVERY DAY 45 tablet 3  . torsemide (DEMADEX) 20 MG tablet Take 2 tablets (40 mg total) by mouth 2 (two) times daily. 360 tablet 3  . venlafaxine XR (EFFEXOR-XR) 150 MG 24 hr capsule Take 150 mg by mouth daily with breakfast.    . vitamin B-12 (CYANOCOBALAMIN) 1000 MCG tablet Take 6,000 mcg by mouth daily.    Marland Kitchen zolpidem (AMBIEN) 10 MG tablet Take 5 mg by mouth at bedtime as  needed. For sleep     No current facility-administered medications for this encounter.    PHYSICAL EXAM: Vitals:   06/15/17 0941  BP: 107/76  Pulse: 89  SpO2: 100%  Weight: 203 lb (92.1 kg)   Wt Readings from Last 3 Encounters:  06/15/17 203 lb (92.1 kg)  04/07/17 200 lb (90.7 kg)  07/09/16 199 lb 8 oz (90.5 kg)    General:  Well appearing. No resp difficulty HEENT: normal Neck: supple. no JVD. Carotids 2+ bilat; no bruits. No lymphadenopathy or thryomegaly appreciated. Cor: PMI nondisplaced. Regular rate & rhythm. No rubs, gallops or murmurs. Lungs: clear Abdomen: obese, soft, nontender, nondistended. No hepatosplenomegaly. No bruits or masses. Good bowel sounds. Extremities: no cyanosis, clubbing, rash, edema Neuro: alert & orientedx3, cranial nerves grossly intact. moves all 4 extremities w/o difficulty. 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 EF 50-55% on echo 11/17 - Stable NYHA II. Volume status ok today on exam and by Optivol. (ICD interrogated personally in Clinic) Will continue to use metolazone 2.5 once a week scheduled (Fridays) with and extra  kcl 20 - Continue current Coreg, losartan, and spironolactone.  BP has been generally too soft to handle titration (SBP runs in 90s at home)  2) Medtronic BiV ICD: s/p change out. Interrogated personally in clinic today and reviewed with her.   3) Elevated uric acid - Allopurinol reinitiated. No recent gout flares.   4) Hypokalemia - Just mildly decreased. As above take extra potassium with metoalzone.   Glori Bickers MD  06/15/2017 10:13 AM

## 2017-06-15 NOTE — Patient Instructions (Signed)
START taking 1 extra potassium on Metolazone days.  Follow up in 6 Months, please call us in March to schedule your follow up.  [phone], Option 3

## 2017-06-18 ENCOUNTER — Telehealth: Payer: Self-pay | Admitting: Internal Medicine

## 2017-06-18 ENCOUNTER — Other Ambulatory Visit: Payer: Self-pay | Admitting: Internal Medicine

## 2017-06-18 ENCOUNTER — Other Ambulatory Visit: Payer: Self-pay

## 2017-06-18 ENCOUNTER — Other Ambulatory Visit (HOSPITAL_COMMUNITY): Payer: Self-pay | Admitting: Internal Medicine

## 2017-06-18 DIAGNOSIS — Z4502 Encounter for adjustment and management of automatic implantable cardiac defibrillator: Secondary | ICD-10-CM

## 2017-06-18 MED ORDER — CARVEDILOL 12.5 MG PO TABS: 18.7500 mg | ORAL_TABLET | Freq: Two times a day (BID) | ORAL | 0 refills | Status: DC

## 2017-06-18 NOTE — Telephone Encounter (Signed)
Spoke with patient today to inform her that we have sent in a 30 day supply of her Carvedilol to a [facility] in [place], [place]. She was thankful.

## 2017-06-18 NOTE — Telephone Encounter (Signed)
New message   [name] is calling for pt. She is completely out of medication and needs a supply until she can be mailed hers.  *STAT* If patient is at the pharmacy, call can be transferred to refill team.   1. Which medications need to be refilled? (please list name of each medication and dose if known) carvedilol 12.5 mg   2. Which pharmacy/location (including street and city if local pharmacy) [facility] [place] [phone]  3. Do they need a 30 day or 90 day supply? [place]

## 2017-06-21 NOTE — Telephone Encounter (Signed)
This is a CHF pt 

## 2017-06-29 ENCOUNTER — Other Ambulatory Visit: Payer: Self-pay

## 2017-07-01 ENCOUNTER — Other Ambulatory Visit: Payer: Self-pay | Admitting: *Deleted

## 2017-07-01 MED ORDER — CARVEDILOL 12.5 MG PO TABS: 18.7500 mg | ORAL_TABLET | Freq: Two times a day (BID) | ORAL | 2 refills | Status: DC

## 2017-07-01 MED ORDER — TORSEMIDE 20 MG PO TABS: 40.0000 mg | ORAL_TABLET | Freq: Two times a day (BID) | ORAL | 2 refills | Status: DC

## 2017-07-05 ENCOUNTER — Ambulatory Visit (INDEPENDENT_AMBULATORY_CARE_PROVIDER_SITE_OTHER): Payer: Medicare PPO | Admitting: *Deleted

## 2017-07-05 DIAGNOSIS — I428 Other cardiomyopathies: Secondary | ICD-10-CM

## 2017-07-05 NOTE — Progress Notes (Signed)
Remote ICD transmission.   

## 2017-07-06 LAB — CUP PACEART REMOTE DEVICE CHECK
Brady Statistic AP VS Percent: 0.01 %
Brady Statistic AS VS Percent: 0.08 %
Date Time Interrogation Session: 20181029062603
HIGH POWER IMPEDANCE MEASURED VALUE: 76 Ohm
Implantable Lead Implant Date: 20070821
Implantable Lead Implant Date: 20070821
Implantable Lead Location: 753860
Implantable Lead Model: 6935
Implantable Pulse Generator Implant Date: 20160211
Lead Channel Impedance Value: 171 Ohm
Lead Channel Impedance Value: 437 Ohm
Lead Channel Impedance Value: 646 Ohm
Lead Channel Pacing Threshold Amplitude: 0.375 V
Lead Channel Pacing Threshold Pulse Width: 0.4 ms
Lead Channel Pacing Threshold Pulse Width: 0.4 ms
Lead Channel Sensing Intrinsic Amplitude: 16.25 mV
Lead Channel Setting Pacing Amplitude: 1.5 V
Lead Channel Setting Pacing Pulse Width: 0.4 ms
Lead Channel Setting Sensing Sensitivity: 0.3 mV
MDC IDC LEAD IMPLANT DT: 20160211
MDC IDC LEAD LOCATION: 753858
MDC IDC LEAD LOCATION: 753859
MDC IDC MSMT BATTERY REMAINING LONGEVITY: 61 mo
MDC IDC MSMT BATTERY VOLTAGE: 2.98 V
MDC IDC MSMT LEADCHNL LV IMPEDANCE VALUE: 627 Ohm
MDC IDC MSMT LEADCHNL LV IMPEDANCE VALUE: 646 Ohm
MDC IDC MSMT LEADCHNL RA IMPEDANCE VALUE: 380 Ohm
MDC IDC MSMT LEADCHNL RA PACING THRESHOLD AMPLITUDE: 1 V
MDC IDC MSMT LEADCHNL RA SENSING INTR AMPL: 1.875 mV
MDC IDC MSMT LEADCHNL RA SENSING INTR AMPL: 1.875 mV
MDC IDC MSMT LEADCHNL RV PACING THRESHOLD AMPLITUDE: 1.375 V
MDC IDC MSMT LEADCHNL RV PACING THRESHOLD PULSEWIDTH: 0.4 ms
MDC IDC MSMT LEADCHNL RV SENSING INTR AMPL: 16.25 mV
MDC IDC SET LEADCHNL LV PACING AMPLITUDE: 1.5 V
MDC IDC SET LEADCHNL LV PACING PULSEWIDTH: 0.4 ms
MDC IDC SET LEADCHNL RV PACING AMPLITUDE: 2.25 V
MDC IDC STAT BRADY AP VP PERCENT: 8.36 %
MDC IDC STAT BRADY AS VP PERCENT: 91.55 %
MDC IDC STAT BRADY RA PERCENT PACED: 8.33 %
MDC IDC STAT BRADY RV PERCENT PACED: 99.33 %

## 2017-07-13 ENCOUNTER — Encounter: Payer: Self-pay | Admitting: Cardiology

## 2017-07-16 ENCOUNTER — Other Ambulatory Visit: Payer: Self-pay | Admitting: Internal Medicine

## 2017-09-07 HISTORY — PX: CHOLECYSTECTOMY: SHX55

## 2017-10-01 ENCOUNTER — Other Ambulatory Visit (HOSPITAL_COMMUNITY): Payer: Self-pay | Admitting: Internal Medicine

## 2017-10-04 ENCOUNTER — Ambulatory Visit (INDEPENDENT_AMBULATORY_CARE_PROVIDER_SITE_OTHER): Payer: Medicare PPO | Admitting: *Deleted

## 2017-10-04 DIAGNOSIS — I428 Other cardiomyopathies: Secondary | ICD-10-CM

## 2017-10-04 NOTE — Progress Notes (Signed)
Remote ICD transmission.   

## 2017-10-05 LAB — CUP PACEART REMOTE DEVICE CHECK
Battery Voltage: 2.97 V
Brady Statistic AP VP Percent: 4.56 %
Brady Statistic AS VP Percent: 95.25 %
HIGH POWER IMPEDANCE MEASURED VALUE: 83 Ohm
Implantable Lead Implant Date: 20160211
Implantable Lead Location: 753858
Implantable Lead Location: 753859
Implantable Lead Location: 753860
Implantable Lead Model: 4194
Implantable Lead Model: 5076
Implantable Lead Model: 6935
Lead Channel Impedance Value: 456 Ohm
Lead Channel Impedance Value: 646 Ohm
Lead Channel Impedance Value: 665 Ohm
Lead Channel Impedance Value: 703 Ohm
Lead Channel Pacing Threshold Amplitude: 0.5 V
Lead Channel Pacing Threshold Amplitude: 1.125 V
Lead Channel Pacing Threshold Amplitude: 1.5 V
Lead Channel Pacing Threshold Pulse Width: 0.4 ms
Lead Channel Sensing Intrinsic Amplitude: 1.875 mV
Lead Channel Setting Pacing Amplitude: 1.75 V
Lead Channel Setting Pacing Amplitude: 2.25 V
Lead Channel Setting Pacing Pulse Width: 0.4 ms
Lead Channel Setting Sensing Sensitivity: 0.3 mV
MDC IDC LEAD IMPLANT DT: 20070821
MDC IDC LEAD IMPLANT DT: 20070821
MDC IDC MSMT BATTERY REMAINING LONGEVITY: 58 mo
MDC IDC MSMT LEADCHNL LV IMPEDANCE VALUE: 171 Ohm
MDC IDC MSMT LEADCHNL RA IMPEDANCE VALUE: 380 Ohm
MDC IDC MSMT LEADCHNL RA PACING THRESHOLD PULSEWIDTH: 0.4 ms
MDC IDC MSMT LEADCHNL RA SENSING INTR AMPL: 1.875 mV
MDC IDC MSMT LEADCHNL RV PACING THRESHOLD PULSEWIDTH: 0.4 ms
MDC IDC MSMT LEADCHNL RV SENSING INTR AMPL: 16.875 mV
MDC IDC MSMT LEADCHNL RV SENSING INTR AMPL: 16.875 mV
MDC IDC PG IMPLANT DT: 20160211
MDC IDC SESS DTM: 20190128051703
MDC IDC SET LEADCHNL LV PACING AMPLITUDE: 1.5 V
MDC IDC SET LEADCHNL LV PACING PULSEWIDTH: 0.4 ms
MDC IDC STAT BRADY AP VS PERCENT: 0.01 %
MDC IDC STAT BRADY AS VS PERCENT: 0.18 %
MDC IDC STAT BRADY RA PERCENT PACED: 4.54 %
MDC IDC STAT BRADY RV PERCENT PACED: 98.91 %

## 2017-10-06 ENCOUNTER — Encounter: Payer: Self-pay | Admitting: Cardiology

## 2017-11-02 ENCOUNTER — Other Ambulatory Visit (HOSPITAL_COMMUNITY): Payer: Self-pay | Admitting: Internal Medicine

## 2017-12-15 ENCOUNTER — Other Ambulatory Visit: Payer: Self-pay

## 2017-12-15 ENCOUNTER — Encounter (HOSPITAL_COMMUNITY): Payer: Self-pay | Admitting: Internal Medicine

## 2017-12-15 ENCOUNTER — Ambulatory Visit (HOSPITAL_COMMUNITY)
Admission: RE | Admit: 2017-12-15 | Discharge: 2017-12-15 | Disposition: A | Discharge status: Home / Self Care | Payer: Medicare PPO | Source: Ambulatory Visit | Attending: Internal Medicine | Admitting: Internal Medicine

## 2017-12-15 VITALS — BP 126/50 | HR 79 | Wt 207.8 lb

## 2017-12-15 DIAGNOSIS — E785 Hyperlipidemia, unspecified: Secondary | ICD-10-CM | POA: Insufficient documentation

## 2017-12-15 DIAGNOSIS — I5032 Chronic diastolic (congestive) heart failure: Secondary | ICD-10-CM | POA: Insufficient documentation

## 2017-12-15 DIAGNOSIS — Z853 Personal history of malignant neoplasm of breast: Secondary | ICD-10-CM | POA: Insufficient documentation

## 2017-12-15 DIAGNOSIS — Z79899 Other long term (current) drug therapy: Secondary | ICD-10-CM | POA: Diagnosis not present

## 2017-12-15 DIAGNOSIS — I110 Hypertensive heart disease with heart failure: Secondary | ICD-10-CM | POA: Diagnosis not present

## 2017-12-15 DIAGNOSIS — G4700 Insomnia, unspecified: Secondary | ICD-10-CM | POA: Diagnosis not present

## 2017-12-15 DIAGNOSIS — R7989 Other specified abnormal findings of blood chemistry: Secondary | ICD-10-CM | POA: Diagnosis not present

## 2017-12-15 DIAGNOSIS — Z8619 Personal history of other infectious and parasitic diseases: Secondary | ICD-10-CM | POA: Insufficient documentation

## 2017-12-15 DIAGNOSIS — K219 Gastro-esophageal reflux disease without esophagitis: Secondary | ICD-10-CM | POA: Diagnosis not present

## 2017-12-15 DIAGNOSIS — I509 Heart failure, unspecified: Secondary | ICD-10-CM | POA: Diagnosis present

## 2017-12-15 DIAGNOSIS — Z9221 Personal history of antineoplastic chemotherapy: Secondary | ICD-10-CM | POA: Diagnosis not present

## 2017-12-15 DIAGNOSIS — Z6839 Body mass index (BMI) 39.0-39.9, adult: Secondary | ICD-10-CM | POA: Diagnosis not present

## 2017-12-15 DIAGNOSIS — Z9010 Acquired absence of unspecified breast and nipple: Secondary | ICD-10-CM | POA: Diagnosis not present

## 2017-12-15 DIAGNOSIS — Z95810 Presence of automatic (implantable) cardiac defibrillator: Secondary | ICD-10-CM | POA: Insufficient documentation

## 2017-12-15 DIAGNOSIS — M109 Gout, unspecified: Secondary | ICD-10-CM | POA: Diagnosis not present

## 2017-12-15 DIAGNOSIS — I5022 Chronic systolic (congestive) heart failure: Secondary | ICD-10-CM

## 2017-12-15 DIAGNOSIS — E669 Obesity, unspecified: Secondary | ICD-10-CM | POA: Diagnosis not present

## 2017-12-15 MED ORDER — TORSEMIDE 20 MG PO TABS: 60.0000 mg | ORAL_TABLET | Freq: Two times a day (BID) | ORAL | 2 refills | Status: DC

## 2017-12-15 NOTE — Progress Notes (Signed)
Patient ID: [name], female   DOB: October 18, 1955, 62 y.o.   MRN: [id number]   ADVANCED HE CLINIC NOTE  PCP: Dr Raeanne Gathers  HPI: [name] is a 62 y/o woman with history of breast CA in 1998 treated with Adriamycin x 4 cycles. In 2007, diagnosed with CHF with EF 25-35%. She is s/p Medtronic BiV-ICD in 2008 with Sprint Fidelis lead which was subsequently replaced. LV function has subsequently recovered  Had cath 12/10. Coronaries normal. Right atrial pressure mean of 7, RV pressure 28/6 . Pulmonary artery pressure was 23/9 with mean of 15.  Pulmonary capillary wedge pressure mean of 9. Fick cardiac output 3.9 L per minute.  Cardiac index 2.2 L per minute per meter squared.  Pulmonary vascular resistance was 1.5 Woods units.  Follow up: Felt bloated and SOB the last month. Went to see PCP and they had her take an extra metolazone (she usually just takes one every Friday). Now feeling better. Breathing better. No orthopnea or PND. No problems with housework or chasing after 5 grandkids. PCP check blood work after metolazone and it was ok.   Recent labs K 3.7, MG 2.4, Cr 1.05 LFL 68 HDL 45. Uric acid 10.6   ICD interrogation done personally in clinic:  No VT/AF. 100% biv pacing. Activity level 4 hours. Fluid level markedly up throughout entire March now all the way down   Echo 11/17: EF 50-55%   Echo 5/11: EF 50-55%  12/15/11 ECHO EF 50%. RV normal. 12/16/11 ICD generator replaced due to EOL 04/2013 EF 50% 07/19/14 Echo EF 50% inferior HK GLS -17.8 6/16 Echo EF 45-50   ROS: All systems negative except as listed in HPI, PMH and Problem List.  Past Medical History:  Diagnosis Date  . AICD (automatic cardioverter/defibrillator) present   . Biventricular implantable cardiac defibrillator)    Medtronic  . Breast cancer ([place])    s/p mastectomy and chemo with adriamycin (1998)  . CHF (congestive heart failure) (HCC)    takes Lasix and Aldactone daily  . GERD (gastroesophageal reflux disease)     takes Omeprazole daily  . Gout    takes Allopurinol daily  . History of bronchitis 2 yrs ago  . History of shingles   . Hyperlipidemia    takes Simvastatin daily  . Insomnia    takes Ambien nightly  . Nonischemic cardiomyopathy ([place])    EF 55% echo 2012  . NSVT (nonsustained ventricular tachycardia) ([place])   . Obesity   . Pneumonia 62yrs ago  . Presence of permanent cardiac pacemaker   . Shortness of breath dyspnea    with exertion  . sprint Fidelis     Current Outpatient Medications  Medication Sig Dispense Refill  . carvedilol (COREG) 12.5 MG tablet Take 1.5 tablets (18.75 mg total) by mouth 2 (two) times daily with a meal. 270 tablet 2  . Cholecalciferol (VITAMIN D3) 5000 units TABS Take 1 tablet by mouth daily as needed (vitamin supplement).     . citalopram (CELEXA) 40 MG tablet Take 20 mg by mouth daily.    Marland Kitchen losartan (COZAAR) 50 MG tablet TAKE 1 TABLET EVERY DAY 90 tablet 3  . multivitamin-iron-minerals-folic acid (THERAPEUTIC-M) TABS tablet Take 1 tablet by mouth daily.    Marland Kitchen omeprazole (PRILOSEC) 20 MG capsule Take 20 mg by mouth daily as needed.    . potassium chloride SA (K-DUR,KLOR-CON) 20 MEQ tablet Take 1 tablet (20 mEq total) by mouth 2 (two) times daily. Take 1 extra tab on Fridays  with Metolazone (Patient taking differently: Take 20 mEq by mouth 2 (two) times daily. ) 65 tablet 3  . ranitidine (ZANTAC 150 MAXIMUM STRENGTH) 150 MG tablet Take 150 mg by mouth 2 (two) times daily.    . simvastatin (ZOCOR) 40 MG tablet TAKE 1 TABLET EVERY EVENING 90 tablet 6  . spironolactone (ALDACTONE) 25 MG tablet TAKE 1/2 TABLET  (12.5  MG  TOTAL) EVERY DAY 45 tablet 3  . torsemide (DEMADEX) 20 MG tablet Take 2 tablets (40 mg total) by mouth 2 (two) times daily. 360 tablet 2  . venlafaxine XR (EFFEXOR-XR) 150 MG 24 hr capsule Take 150 mg by mouth daily with breakfast.    . vitamin B-12 (CYANOCOBALAMIN) 1000 MCG tablet Take 6,000 mcg by mouth daily.    Marland Kitchen zolpidem (AMBIEN) 10 MG  tablet Take 5 mg by mouth at bedtime as needed. For sleep     No current facility-administered medications for this encounter.    PHYSICAL EXAM: Vitals:   12/15/17 1023  BP: (!) 126/50  Pulse: 79  SpO2: 100%  Weight: 207 lb 12 oz (94.2 kg)   Wt Readings from Last 3 Encounters:  06/15/17 203 lb (92.1 kg)  04/07/17 200 lb (90.7 kg)  07/09/16 199 lb 8 oz (90.5 kg)   General:  Well appearing. No resp difficulty HEENT: normal Neck: supple. no JVD. Carotids 2+ bilat; no bruits. No lymphadenopathy or thryomegaly appreciated. Cor: PMI nondisplaced. Regular rate & rhythm. No rubs, gallops or murmurs. Lungs: clear Abdomen: obese, soft, nontender, nondistended. No hepatosplenomegaly. No bruits or masses. Good bowel sounds. Extremities: no cyanosis, clubbing, rash, edema Neuro: alert & orientedx3, cranial nerves grossly intact. moves all 4 extremities w/o difficulty. 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 EF 50-55% on echo 11/17 - Doing well. Had significant volume overload for the past month despite torsemide 40 bid and metolazone 2.5 q Friday. Now much improved with extra dose of metolazone. We discussed increasing torsemide to 60 bid or increasing metolazone to q M and F. Wil start by increasing torsemide (start when she is back from the beach.) BMET 1 week aftre change. Can take extra metolazone as needed - with caution not to overdo it.  - NYHA II - Volume status now ok  - RTC 6 months with echo   2) Medtronic BiV ICD:  - ICD interrogated personally in clinic   3) Elevated uric acid - On allopurinol. No recent gout flares.   4) HTN - Blood pressure well controlled. Continue current regimen.   Glori Bickers MD  12/15/2017 10:23 AM

## 2017-12-15 NOTE — Patient Instructions (Signed)
When you get back from the beach, Increase Torsemide to 60 mg (3 tabs) Twice daily   Get labs done about a week after you increase the Torsemide  We will contact you in 6 months to schedule your next appointment and echocardiogram

## 2017-12-17 ENCOUNTER — Other Ambulatory Visit (HOSPITAL_COMMUNITY): Payer: Self-pay | Admitting: Internal Medicine

## 2018-01-03 ENCOUNTER — Ambulatory Visit (INDEPENDENT_AMBULATORY_CARE_PROVIDER_SITE_OTHER): Payer: Medicare PPO | Admitting: *Deleted

## 2018-01-03 DIAGNOSIS — I428 Other cardiomyopathies: Secondary | ICD-10-CM | POA: Diagnosis not present

## 2018-01-03 NOTE — Progress Notes (Signed)
Remote ICD transmission.   

## 2018-01-04 ENCOUNTER — Encounter: Payer: Self-pay | Admitting: Cardiology

## 2018-01-05 LAB — CUP PACEART REMOTE DEVICE CHECK
Battery Remaining Longevity: 49 mo
Brady Statistic AP VS Percent: 0.01 %
Brady Statistic AS VS Percent: 0.05 %
HIGH POWER IMPEDANCE MEASURED VALUE: 79 Ohm
Implantable Lead Implant Date: 20160211
Implantable Lead Location: 753859
Implantable Lead Location: 753860
Implantable Lead Model: 5076
Implantable Pulse Generator Implant Date: 20160211
Lead Channel Impedance Value: 171 Ohm
Lead Channel Impedance Value: 399 Ohm
Lead Channel Impedance Value: 570 Ohm
Lead Channel Pacing Threshold Amplitude: 0.375 V
Lead Channel Pacing Threshold Pulse Width: 0.4 ms
Lead Channel Pacing Threshold Pulse Width: 0.4 ms
Lead Channel Sensing Intrinsic Amplitude: 1.5 mV
Lead Channel Sensing Intrinsic Amplitude: 15.25 mV
Lead Channel Sensing Intrinsic Amplitude: 15.25 mV
Lead Channel Setting Pacing Amplitude: 1.5 V
Lead Channel Setting Pacing Pulse Width: 0.4 ms
Lead Channel Setting Pacing Pulse Width: 0.4 ms
MDC IDC LEAD IMPLANT DT: 20070821
MDC IDC LEAD IMPLANT DT: 20070821
MDC IDC LEAD LOCATION: 753858
MDC IDC MSMT BATTERY VOLTAGE: 2.97 V
MDC IDC MSMT LEADCHNL LV IMPEDANCE VALUE: 532 Ohm
MDC IDC MSMT LEADCHNL LV IMPEDANCE VALUE: 570 Ohm
MDC IDC MSMT LEADCHNL RA IMPEDANCE VALUE: 342 Ohm
MDC IDC MSMT LEADCHNL RA PACING THRESHOLD AMPLITUDE: 0.875 V
MDC IDC MSMT LEADCHNL RA SENSING INTR AMPL: 1.5 mV
MDC IDC MSMT LEADCHNL RV PACING THRESHOLD AMPLITUDE: 1.625 V
MDC IDC MSMT LEADCHNL RV PACING THRESHOLD PULSEWIDTH: 0.4 ms
MDC IDC SESS DTM: 20190429052405
MDC IDC SET LEADCHNL RA PACING AMPLITUDE: 1.5 V
MDC IDC SET LEADCHNL RV PACING AMPLITUDE: 2.5 V
MDC IDC SET LEADCHNL RV SENSING SENSITIVITY: 0.3 mV
MDC IDC STAT BRADY AP VP PERCENT: 5.08 %
MDC IDC STAT BRADY AS VP PERCENT: 94.87 %
MDC IDC STAT BRADY RA PERCENT PACED: 5.07 %
MDC IDC STAT BRADY RV PERCENT PACED: 99.57 %

## 2018-01-11 ENCOUNTER — Other Ambulatory Visit: Payer: Self-pay | Admitting: Internal Medicine

## 2018-01-20 ENCOUNTER — Other Ambulatory Visit (HOSPITAL_COMMUNITY): Payer: Self-pay | Admitting: Internal Medicine

## 2018-02-23 ENCOUNTER — Other Ambulatory Visit: Payer: Self-pay | Admitting: Internal Medicine

## 2018-02-23 ENCOUNTER — Other Ambulatory Visit: Payer: Self-pay | Admitting: Cardiology

## 2018-02-23 DIAGNOSIS — Z4502 Encounter for adjustment and management of automatic implantable cardiac defibrillator: Secondary | ICD-10-CM

## 2018-03-08 ENCOUNTER — Other Ambulatory Visit (HOSPITAL_COMMUNITY): Payer: Self-pay | Admitting: *Deleted

## 2018-03-08 DIAGNOSIS — Z4502 Encounter for adjustment and management of automatic implantable cardiac defibrillator: Secondary | ICD-10-CM

## 2018-03-08 MED ORDER — SPIRONOLACTONE 25 MG PO TABS: ORAL_TABLET | ORAL | 3 refills | Status: DC

## 2018-03-08 MED ORDER — LOSARTAN POTASSIUM 50 MG PO TABS: 50.0000 mg | ORAL_TABLET | Freq: Every day | ORAL | 3 refills | Status: DC

## 2018-03-31 ENCOUNTER — Encounter: Payer: Self-pay | Admitting: Internal Medicine

## 2018-04-04 ENCOUNTER — Ambulatory Visit (INDEPENDENT_AMBULATORY_CARE_PROVIDER_SITE_OTHER): Payer: Medicare PPO | Admitting: *Deleted

## 2018-04-04 DIAGNOSIS — I428 Other cardiomyopathies: Secondary | ICD-10-CM

## 2018-04-04 NOTE — Progress Notes (Signed)
Remote ICD transmission.   

## 2018-04-05 ENCOUNTER — Encounter: Payer: Self-pay | Admitting: Cardiology

## 2018-04-08 ENCOUNTER — Encounter: Payer: Medicare PPO | Admitting: Nurse Practitioner

## 2018-04-08 LAB — CUP PACEART REMOTE DEVICE CHECK
Battery Remaining Longevity: 44 mo
Battery Voltage: 2.96 V
Brady Statistic AP VP Percent: 4.51 %
Brady Statistic RA Percent Paced: 4.51 %
HIGH POWER IMPEDANCE MEASURED VALUE: 73 Ohm
Implantable Lead Implant Date: 20070821
Implantable Lead Implant Date: 20160211
Implantable Lead Location: 753858
Implantable Lead Location: 753859
Implantable Lead Location: 753860
Implantable Lead Model: 4194
Implantable Lead Model: 6935
Lead Channel Impedance Value: 342 Ohm
Lead Channel Impedance Value: 437 Ohm
Lead Channel Impedance Value: 589 Ohm
Lead Channel Impedance Value: 589 Ohm
Lead Channel Impedance Value: 627 Ohm
Lead Channel Pacing Threshold Amplitude: 1.25 V
Lead Channel Pacing Threshold Amplitude: 1.625 V
Lead Channel Pacing Threshold Pulse Width: 0.4 ms
Lead Channel Sensing Intrinsic Amplitude: 17.75 mV
Lead Channel Sensing Intrinsic Amplitude: 2.25 mV
Lead Channel Setting Pacing Amplitude: 1.5 V
Lead Channel Setting Sensing Sensitivity: 0.3 mV
MDC IDC LEAD IMPLANT DT: 20070821
MDC IDC MSMT LEADCHNL LV IMPEDANCE VALUE: 171 Ohm
MDC IDC MSMT LEADCHNL LV PACING THRESHOLD AMPLITUDE: 0.375 V
MDC IDC MSMT LEADCHNL LV PACING THRESHOLD PULSEWIDTH: 0.4 ms
MDC IDC MSMT LEADCHNL RA PACING THRESHOLD PULSEWIDTH: 0.4 ms
MDC IDC MSMT LEADCHNL RA SENSING INTR AMPL: 2.25 mV
MDC IDC MSMT LEADCHNL RV SENSING INTR AMPL: 17.75 mV
MDC IDC PG IMPLANT DT: 20160211
MDC IDC SESS DTM: 20190729062603
MDC IDC SET LEADCHNL LV PACING PULSEWIDTH: 0.4 ms
MDC IDC SET LEADCHNL RA PACING AMPLITUDE: 2 V
MDC IDC SET LEADCHNL RV PACING AMPLITUDE: 2.5 V
MDC IDC SET LEADCHNL RV PACING PULSEWIDTH: 0.4 ms
MDC IDC STAT BRADY AP VS PERCENT: 0.01 %
MDC IDC STAT BRADY AS VP PERCENT: 95.4 %
MDC IDC STAT BRADY AS VS PERCENT: 0.07 %
MDC IDC STAT BRADY RV PERCENT PACED: 99.6 %

## 2018-04-15 ENCOUNTER — Encounter: Payer: Self-pay | Admitting: Nurse Practitioner

## 2018-05-03 NOTE — Progress Notes (Signed)
Electrophysiology Office Note Date: 05/06/2018  ID:  [name], [name] 1956/02/07, MRN [id number]  PCP: System, [place] Not In Primary Cardiologist: [place] Electrophysiologist: Caryl Comes  CC: Routine ICD follow-up  [name] is a 62 y.o. female seen today for Dr Caryl Comes.  She presents today for routine electrophysiology followup.  Since last being seen in our clinic, the patient reports doing relatively well.  She has had recent orthopnea as well as LE edema which is new.  This is despite Torsemide, [name], and once weekly Metolazone.  She also has had increased shortness of breath with exertion.  She denies chest pain, nausea, vomiting, dizziness, syncope, weight gain, or early satiety.  She has not had ICD shocks.   Device History: MDT CRTD implanted 2007 for NICM; gen change 2013; gen change 2016 with new RV lead (previously implanted 6949 lead fractured and abandoned) History of appropriate therapy: No History of AAD therapy: No   Past Medical History:  Diagnosis Date  . AICD (automatic cardioverter/defibrillator) present   . Biventricular implantable cardiac defibrillator)    Medtronic  . Breast cancer ([place])    s/p mastectomy and chemo with adriamycin (1998)  . CHF (congestive heart failure) (HCC)    takes Lasix and Aldactone daily  . GERD (gastroesophageal reflux disease)    takes Omeprazole daily  . Gout    takes Allopurinol daily  . History of bronchitis 2 yrs ago  . History of shingles   . Hyperlipidemia    takes Simvastatin daily  . Insomnia    takes Ambien nightly  . Nonischemic cardiomyopathy ([place])    EF 55% echo 2012  . NSVT (nonsustained ventricular tachycardia) ([place])   . Obesity   . Pneumonia 32yrs ago  . Presence of permanent cardiac pacemaker   . Shortness of breath dyspnea    with exertion  . sprint Fidelis    Past Surgical History:  Procedure Laterality Date  . ABDOMINAL HYSTERECTOMY    . APPENDECTOMY  1978  . BiV ICD  2007   Medtronic  . BIV ICD GENERTAOR CHANGE OUT N/A 12/16/2011   Procedure: BIV ICD GENERTAOR CHANGE OUT;  Surgeon: [name], MD;  Location[facility] C[facility] LAB;  Service: Cardiovascular;  Laterality: N/A;  . COLONOSCOPY    . cyst removed from wrist Left    early 70's  . ICD LEAD REMOVAL Right 10/18/2014   Procedure: ICD LEAD REMOVAL/EXTRACTION/REIMPLANTATION;  Surgeon: Evans Lance, MD;  Location: [facility];  Service: Cardiovascular;  Laterality: Right;  . MASTECTOMY Left 1998  . port a cath placed  1998  . port removed  1998    Current Outpatient Medications  Medication Sig Dispense Refill  . allopurinol (ZYLOPRIM) 100 MG tablet Take 1 tablet by mouth daily.    . carvedilol (COREG) 12.5 MG tablet TAKE 1 AND 1/2 TABLETS TWICE DAILY WITH A MEAL 270 tablet 0  . Cholecalciferol (VITAMIN D3) 5000 units TABS Take 1 tablet by mouth daily as needed (vitamin supplement).     . citalopram (CELEXA) 40 MG tablet Take 20 mg by mouth daily.    Marland Kitchen losartan (COZAAR) 50 MG tablet Take 1 tablet (50 mg total) by mouth daily. 90 tablet 3  . metolazone (ZAROXOLYN) 2.5 MG tablet Take 2.5 mg by mouth 2 (two) times a week.    . multivitamin-iron-minerals-folic acid (THERAPEUTIC-M) TABS tablet Take 1 tablet by mouth daily.    Marland Kitchen omeprazole (PRILOSEC) 20 MG capsule Take 20 mg by mouth daily as  needed.    . potassium chloride SA (K-DUR,KLOR-CON) 20 MEQ tablet Take 1 tablet (20 mEq total) by mouth 2 (two) times daily. Take 1 extra tab on Fridays with Metolazone 65 tablet 3  . ranitidine (ZANTAC 150 MAXIMUM STRENGTH) 150 MG tablet Take 150 mg by mouth 2 (two) times daily.    . simvastatin (ZOCOR) 40 MG tablet TAKE 1 TABLET EVERY EVENING 90 tablet 6  . spironolactone (ALDACTONE) 25 MG tablet TAKE 1/2 TABLET  (12.5  MG  TOTAL) EVERY DAY 45 tablet 3  . torsemide (DEMADEX) 20 MG tablet Take 3 tablets (60 mg total) by mouth 2 (two) times daily. 540 tablet 2  . venlafaxine XR (EFFEXOR-XR) 150 MG 24 hr capsule Take 150 mg by mouth  daily with breakfast.    . vitamin B-12 (CYANOCOBALAMIN) 1000 MCG tablet Take 6,000 mcg by mouth daily.    Marland Kitchen zolpidem (AMBIEN) 10 MG tablet Take 5 mg by mouth at bedtime as needed. For sleep     No current facility-administered medications for this visit.     Allergies:   Buprenorphine hcl; Morphine and related; Percocet [oxycodone-acetaminophen]; and Oxycodone   Social History: Social History   Socioeconomic History  . Marital status: Married    Spouse name: Not on file  . Number of children: Not on file  . Years of education: Not on file  . Highest education level: Not on file  Occupational History  . Not on file  Social Needs  . Financial resource strain: Not on file  . Food insecurity:    Worry: Not on file    Inability: Not on file  . Transportation needs:    Medical: Not on file    Non-medical: Not on file  Tobacco Use  . Smoking status: Never Smoker  . Smokeless tobacco: Never Used  Substance and Sexual Activity  . Alcohol use: No  . Drug use: No  . Sexual activity: Yes    Birth control/protection: None  Lifestyle  . Physical activity:    Days per week: Not on file    Minutes per session: Not on file  . Stress: Not on file  Relationships  . Social connections:    Talks on phone: Not on file    Gets together: Not on file    Attends religious service: Not on file    Active member of club or organization: Not on file    Attends meetings of clubs or organizations: Not on file    Relationship status: Not on file  . Intimate partner violence:    Fear of current or ex partner: Not on file    Emotionally abused: Not on file    Physically abused: Not on file    Forced sexual activity: Not on file  Other Topics Concern  . Not on file  Social History Narrative  . Not on file    Family History: Family History  Problem Relation Age of Onset  . Diabetes Mother   . Heart disease Father   . Alzheimer's disease Father     Review of Systems: All other systems  reviewed and are otherwise negative except as noted above.   Physical Exam: VS:  BP 120/82   Pulse 84   Ht 5\' 1"  (1.549 m)   Wt 217 lb 6.4 oz (98.6 kg)   SpO2 96%   BMI 41.08 kg/m  , BMI Body mass index is 41.08 kg/m.  GEN- The patient is well appearing, alert and oriented x 3  today.   HEENT: normocephalic, atraumatic; sclera clear, conjunctiva pink; hearing intact; oropharynx clear; neck supple  Lungs- Clear to ausculation bilaterally, normal work of breathing.  No wheezes, rales, rhonchi Heart- Regular rate and rhythm (paced) GI- soft, non-tender, non-distended, bowel sounds present  Extremities- no clubbing, cyanosis, or edema  MS- no significant deformity or atrophy Skin- warm and dry, no rash or lesion; ICD pocket well healed Psych- euthymic mood, full affect Neuro- strength and sensation are intact  ICD interrogation- reviewed in detail today,  See PACEART report  EKG:  EKG is not ordered today.  Recent Labs: No results found for requested labs within last 8760 hours.   Wt Readings from Last 3 Encounters:  05/06/18 217 lb 6.4 oz (98.6 kg)  12/15/17 207 lb 12 oz (94.2 kg)  06/15/17 203 lb (92.1 kg)     Other studies Reviewed: Additional studies/ records that were reviewed today include: Dr Caryl Comes and Dr Bensimhon's office notes   Assessment and Plan:  1.  Chronic systolic dysfunction Stable on an appropriate medical regimen Normal ICD function See Pace Art report No changes today EF normalized post CRT She has had worsening HF symptoms recently. Will go ahead and update echo. Labs today. If BMET ok, will increase Spironolactone to 25mg  daily.   2.  HTN Stable No change required today  3.  Ventricular tachycardia Below detection Zone decreased to 167bpm today She has occasional palpitations which may be VT Will follow Continue BB    Current medicines are reviewed at length with the patient today.   The patient does not have concerns regarding her  medicines.  The following changes were made today:  none  Labs/ tests ordered today include:  Orders Placed This Encounter  Procedures  . Basic metabolic panel  . Pro b natriuretic peptide (BNP)  . Hepatic function panel  . EKG 12-Lead  . ECHOCARDIOGRAM COMPLETE     Disposition:   Follow up with [facility], Dr Caryl Comes 1 year, Dr Haroldine Laws as scheduled      Signed, Chanetta Marshall, NP 05/06/2018 10:03 AM  [address] [place] [address] [phone] (office) [phone] (fax)

## 2018-05-06 ENCOUNTER — Encounter: Payer: Self-pay | Admitting: Nurse Practitioner

## 2018-05-06 ENCOUNTER — Ambulatory Visit: Payer: Medicare PPO | Admitting: Nurse Practitioner

## 2018-05-06 VITALS — BP 120/82 | HR 84 | Ht 61.0 in | Wt 217.4 lb

## 2018-05-06 DIAGNOSIS — I428 Other cardiomyopathies: Secondary | ICD-10-CM | POA: Diagnosis not present

## 2018-05-06 DIAGNOSIS — I472 Ventricular tachycardia, unspecified: Secondary | ICD-10-CM

## 2018-05-06 DIAGNOSIS — I10 Essential (primary) hypertension: Secondary | ICD-10-CM

## 2018-05-06 DIAGNOSIS — I5022 Chronic systolic (congestive) heart failure: Secondary | ICD-10-CM

## 2018-05-06 LAB — CUP PACEART INCLINIC DEVICE CHECK
Date Time Interrogation Session: 20190830101619
Implantable Lead Location: 753858
Implantable Lead Location: 753859
Implantable Lead Model: 4194
Implantable Lead Model: 5076
Implantable Lead Model: 6935
MDC IDC LEAD IMPLANT DT: 20070821
MDC IDC LEAD IMPLANT DT: 20070821
MDC IDC LEAD IMPLANT DT: 20160211
MDC IDC LEAD LOCATION: 753860
MDC IDC PG IMPLANT DT: 20160211

## 2018-05-06 LAB — BASIC METABOLIC PANEL
BUN / CREAT RATIO: 16 (ref 12–28)
BUN: 18 mg/dL (ref 8–27)
CALCIUM: 9.9 mg/dL (ref 8.7–10.3)
CHLORIDE: 99 mmol/L (ref 96–106)
CO2: 26 mmol/L (ref 20–29)
Creatinine, Ser: 1.13 mg/dL — ABNORMAL HIGH (ref 0.57–1.00)
GFR calc Af Amer: 59 mL/min/{1.73_m2} — ABNORMAL LOW (ref >59)
GFR, EST NON AFRICAN AMERICAN: 51 mL/min/{1.73_m2} — AB (ref >59)
Glucose: 92 mg/dL (ref 65–99)
POTASSIUM: 4.4 mmol/L (ref 3.5–5.2)
SODIUM: 138 mmol/L (ref 134–144)

## 2018-05-06 LAB — HEPATIC FUNCTION PANEL
ALBUMIN: 4.2 g/dL (ref 3.6–4.8)
ALK PHOS: 164 IU/L — AB (ref 39–117)
ALT: 16 IU/L (ref 0–32)
AST: 23 IU/L (ref 0–40)
Bilirubin Total: 0.4 mg/dL (ref 0.0–1.2)
Bilirubin, Direct: 0.15 mg/dL (ref 0.00–0.40)
TOTAL PROTEIN: 6.7 g/dL (ref 6.0–8.5)

## 2018-05-06 LAB — PRO B NATRIURETIC PEPTIDE: NT-Pro BNP: 1293 pg/mL — ABNORMAL HIGH (ref 0–287)

## 2018-05-06 NOTE — Patient Instructions (Addendum)
Medication Instructions:   Your physician recommends that you continue on your current medications as directed. Please refer to the Current Medication list given to you today.   If you need a refill on your cardiac medications before your next appointment, please call your pharmacy.  Labwork:  BMET BNP AND LFT  TODAY    Testing/Procedures: Your physician has requested that you have an echocardiogram. Echocardiography is a painless test that uses sound waves to create images of your heart. It provides your doctor with information about the size and shape of your heart and how well your heart's chambers and valves are working. This procedure takes approximately one hour. There are no restrictions for this procedure.  Follow-Up:  Your physician wants you to follow-up in: [place] will receive a reminder letter in the mail two months in advance. If you don't receive a letter, please call our office to schedule the follow-up appointment.   Remote monitoring is used to monitor your Pacemaker of ICD from home. This monitoring reduces the number of office visits required to check your device to one time per year. It allows [place] to keep an eye on the functioning of your device to ensure it is working properly. You are scheduled for a device check from home on .07-04-18  You may send your transmission at any time that day. If you have a wireless device, the transmission will be sent automatically. After your physician reviews your transmission, you will receive a postcard with your next transmission date.     Any Other Special Instructions Will Be Listed Below (If Applicable).

## 2018-05-10 ENCOUNTER — Ambulatory Visit (HOSPITAL_COMMUNITY): Payer: Medicare PPO | Attending: Nurse Practitioner

## 2018-05-10 ENCOUNTER — Other Ambulatory Visit: Payer: Self-pay

## 2018-05-10 DIAGNOSIS — I428 Other cardiomyopathies: Secondary | ICD-10-CM | POA: Diagnosis not present

## 2018-05-10 DIAGNOSIS — Z6841 Body Mass Index (BMI) 40.0 and over, adult: Secondary | ICD-10-CM | POA: Insufficient documentation

## 2018-05-10 DIAGNOSIS — I5022 Chronic systolic (congestive) heart failure: Secondary | ICD-10-CM

## 2018-05-10 DIAGNOSIS — I472 Ventricular tachycardia: Secondary | ICD-10-CM | POA: Diagnosis not present

## 2018-05-10 DIAGNOSIS — I081 Rheumatic disorders of both mitral and tricuspid valves: Secondary | ICD-10-CM | POA: Insufficient documentation

## 2018-05-10 DIAGNOSIS — E669 Obesity, unspecified: Secondary | ICD-10-CM | POA: Diagnosis not present

## 2018-05-10 DIAGNOSIS — I509 Heart failure, unspecified: Secondary | ICD-10-CM | POA: Diagnosis present

## 2018-05-12 ENCOUNTER — Telehealth: Payer: Self-pay | Admitting: *Deleted

## 2018-05-12 MED ORDER — SPIRONOLACTONE 25 MG PO TABS: 25.0000 mg | ORAL_TABLET | Freq: Every day | ORAL | 2 refills | Status: DC

## 2018-05-12 NOTE — Telephone Encounter (Signed)
Notes recorded by Stanton Kidney, RN on 05/12/2018 at 3:53 PM EDT  Informed patient of results and verbal understanding expressed. Updated med list & Rx sent to [facility] mail order per request. She will obtain BMET at PCP next week [facility]) - order faxed to their office. ------

## 2018-05-12 NOTE — Telephone Encounter (Signed)
-----   Message from Patsey Berthold, NP sent at 05/10/2018  9:03 AM EDT ----- Please notify patient of lab results. Increase Spironolactone to 25mg  daily. Repeat BMET next week. Please ask her to keep track of daily weights. Thanks!

## 2018-06-13 ENCOUNTER — Other Ambulatory Visit (HOSPITAL_COMMUNITY): Payer: Self-pay | Admitting: *Deleted

## 2018-06-13 ENCOUNTER — Encounter (HOSPITAL_COMMUNITY): Payer: Self-pay | Admitting: Internal Medicine

## 2018-06-13 ENCOUNTER — Other Ambulatory Visit: Payer: Self-pay

## 2018-06-13 ENCOUNTER — Other Ambulatory Visit (HOSPITAL_COMMUNITY): Payer: Medicare PPO

## 2018-06-13 ENCOUNTER — Ambulatory Visit (HOSPITAL_COMMUNITY)
Admission: RE | Admit: 2018-06-13 | Discharge: 2018-06-13 | Disposition: A | Discharge status: Home / Self Care | Payer: Medicare PPO | Source: Ambulatory Visit | Attending: Internal Medicine | Admitting: Internal Medicine

## 2018-06-13 VITALS — BP 93/77 | HR 88 | Wt 212.2 lb

## 2018-06-13 DIAGNOSIS — K219 Gastro-esophageal reflux disease without esophagitis: Secondary | ICD-10-CM | POA: Insufficient documentation

## 2018-06-13 DIAGNOSIS — I5032 Chronic diastolic (congestive) heart failure: Secondary | ICD-10-CM | POA: Insufficient documentation

## 2018-06-13 DIAGNOSIS — Z79899 Other long term (current) drug therapy: Secondary | ICD-10-CM | POA: Insufficient documentation

## 2018-06-13 DIAGNOSIS — I10 Essential (primary) hypertension: Secondary | ICD-10-CM

## 2018-06-13 DIAGNOSIS — I110 Hypertensive heart disease with heart failure: Secondary | ICD-10-CM | POA: Insufficient documentation

## 2018-06-13 DIAGNOSIS — G4700 Insomnia, unspecified: Secondary | ICD-10-CM | POA: Insufficient documentation

## 2018-06-13 DIAGNOSIS — E669 Obesity, unspecified: Secondary | ICD-10-CM | POA: Diagnosis not present

## 2018-06-13 DIAGNOSIS — M109 Gout, unspecified: Secondary | ICD-10-CM | POA: Diagnosis not present

## 2018-06-13 DIAGNOSIS — Z853 Personal history of malignant neoplasm of breast: Secondary | ICD-10-CM | POA: Diagnosis not present

## 2018-06-13 DIAGNOSIS — Z95810 Presence of automatic (implantable) cardiac defibrillator: Secondary | ICD-10-CM | POA: Diagnosis not present

## 2018-06-13 DIAGNOSIS — I5022 Chronic systolic (congestive) heart failure: Secondary | ICD-10-CM

## 2018-06-13 DIAGNOSIS — E785 Hyperlipidemia, unspecified: Secondary | ICD-10-CM | POA: Diagnosis not present

## 2018-06-13 LAB — BASIC METABOLIC PANEL
ANION GAP: 9 (ref 5–15)
BUN: 17 mg/dL (ref 8–23)
CALCIUM: 9.9 mg/dL (ref 8.9–10.3)
CO2: 29 mmol/L (ref 22–32)
Chloride: 101 mmol/L (ref 98–111)
Creatinine, Ser: 1.01 mg/dL — ABNORMAL HIGH (ref 0.44–1.00)
GFR, EST NON AFRICAN AMERICAN: 58 mL/min — AB (ref >60)
GLUCOSE: 113 mg/dL — AB (ref 70–99)
Potassium: 4.1 mmol/L (ref 3.5–5.1)
Sodium: 139 mmol/L (ref 135–145)

## 2018-06-13 LAB — CBC
HCT: 39.9 % (ref 36.0–46.0)
Hemoglobin: 12.5 g/dL (ref 12.0–15.0)
MCH: 31.2 pg (ref 26.0–34.0)
MCHC: 31.3 g/dL (ref 30.0–36.0)
MCV: 99.5 fL (ref 78.0–100.0)
PLATELETS: 260 10*3/uL (ref 150–400)
RBC: 4.01 MIL/uL (ref 3.87–5.11)
RDW: 14.2 % (ref 11.5–15.5)
WBC: 6.4 10*3/uL (ref 4.0–10.5)

## 2018-06-13 LAB — PROTIME-INR
INR: 1.06
PROTHROMBIN TIME: 13.7 s (ref 11.4–15.2)

## 2018-06-13 LAB — BRAIN NATRIURETIC PEPTIDE: B Natriuretic Peptide: 245.5 pg/mL — ABNORMAL HIGH (ref 0.0–100.0)

## 2018-06-13 NOTE — Patient Instructions (Signed)
Labs done today  We will contact you in 4 months to schedule your next appointment.   You are scheduled for a Cardiac Catheterization on Monday, October 14 with Dr. Glori Bickers.  1. Please arrive at the [facility] (Main Entrance A) at [facility]: [address] [place], [place] [address] at 8:30 AM (This time is two hours before your procedure to ensure your preparation). Free valet parking service is available.   Special note: Every effort is made to have your procedure done on time. Please understand that emergencies sometimes delay scheduled procedures.  2. Diet: Do not eat solid foods after midnight.  The patient may have clear liquids until 5am upon the day of the procedure.  3. Labs: Done today  4. Medication instructions in preparation for your procedure:  Do Not take Torsemide or Spironolactone AM of test  On the morning of your procedure, take your Aspirin and any morning medicines NOT listed above.  You may use sips of water.  5. Plan for one night stay--bring personal belongings. 6. Bring a current list of your medications and current insurance cards. 7. You MUST have a responsible person to drive you home. 8. Someone MUST be with you the first 24 hours after you arrive home or your discharge will be delayed. 9. Please wear clothes that are easy to get on and off and wear slip-on shoes.  Thank you for allowing [place] to care for you!   -- [facility]

## 2018-06-13 NOTE — H&P (View-Only) (Signed)
Patient ID: [name], female   DOB: 1956/03/28, 62 y.o.   MRN: [id number]   ADVANCED HE CLINIC NOTE  PCP: Dr Raeanne Gathers  HPI: [name] is a 62 y/o woman with history of breast CA in 1998 treated with Adriamycin x 4 cycles. In 2007, diagnosed with CHF with EF 25-35%. She is s/p Medtronic BiV-ICD in 2008 with Sprint Fidelis lead which was subsequently replaced. LV function has subsequently recovered  Had cath 12/10. Coronaries normal. Right atrial pressure mean of 7, RV pressure 28/6 . Pulmonary artery pressure was 23/9 with mean of 15.  Pulmonary capillary wedge pressure mean of 9. Fick cardiac output 3.9 L per minute.  Cardiac index 2.2 L per minute per meter squared.  Pulmonary vascular resistance was 1.5 Woods units.  Follow up: Feels pretty good. Staying active going to grandkids sports games. Was bloated over the summer and now fluid much better. Saw Dr. Caryl Comes and had echo in 9/19 EF 40-45%. [name] increased to 25 daily. Breathing better. Occasional edema. Taking metolazone 2.5 every Friday.  No significant snoring.  ICD interrogation done personally in clinic:  No VT/AF. 100% biv pacing. Activity level 4 hours. Fluid level markedly up throughout entire July but now back down. Personally reviewed  Echo 05/10/18 EF 40-45% (I reviewed and felt 45%) Moderate TR with some septal flattening. RV ok  Personally reviewed   Echo 11/17: EF 50-55%   Echo 5/11: EF 50-55%  12/15/11 ECHO EF 50%. RV normal. 12/16/11 ICD generator replaced due to EOL 04/2013 EF 50% 07/19/14 Echo EF 50% inferior HK GLS -17.8 6/16 Echo EF 45-50   ROS: All systems negative except as listed in HPI, PMH and Problem List.  Past Medical History:  Diagnosis Date  . AICD (automatic cardioverter/defibrillator) present   . Biventricular implantable cardiac defibrillator)    Medtronic  . Breast cancer ([place])    s/p mastectomy and chemo with adriamycin (1998)  . CHF (congestive heart failure) (HCC)    takes Lasix and  Aldactone daily  . GERD (gastroesophageal reflux disease)    takes Omeprazole daily  . Gout    takes Allopurinol daily  . History of bronchitis 2 yrs ago  . History of shingles   . Hyperlipidemia    takes Simvastatin daily  . Insomnia    takes Ambien nightly  . Nonischemic cardiomyopathy ([place])    EF 55% echo 2012  . NSVT (nonsustained ventricular tachycardia) ([place])   . Obesity   . Pneumonia 10yrs ago  . Presence of permanent cardiac pacemaker   . Shortness of breath dyspnea    with exertion  . sprint Fidelis     Current Outpatient Medications  Medication Sig Dispense Refill  . allopurinol (ZYLOPRIM) 100 MG tablet Take 1 tablet by mouth daily.    . carvedilol (COREG) 12.5 MG tablet TAKE 1 AND 1/2 TABLETS TWICE DAILY WITH A MEAL 270 tablet 0  . Cholecalciferol (VITAMIN D3) 5000 units TABS Take 1 tablet by mouth daily as needed (vitamin supplement).     . citalopram (CELEXA) 40 MG tablet Take 40 mg by mouth daily.    Marland Kitchen losartan (COZAAR) 50 MG tablet Take 1 tablet (50 mg total) by mouth daily. 90 tablet 3  . metolazone (ZAROXOLYN) 2.5 MG tablet Take 2.5 mg by mouth once a week.    . multivitamin-iron-minerals-folic acid (THERAPEUTIC-M) TABS tablet Take 1 tablet by mouth daily.    Marland Kitchen omeprazole (PRILOSEC) 20 MG capsule Take 20 mg by mouth daily as needed.    Marland Kitchen  potassium chloride SA (K-DUR,KLOR-CON) 20 MEQ tablet Take 1 tablet (20 mEq total) by mouth 2 (two) times daily. Take 1 extra tab on Fridays with Metolazone 65 tablet 3  . ranitidine (ZANTAC 150 MAXIMUM STRENGTH) 150 MG tablet Take 150 mg by mouth 2 (two) times daily.    . simvastatin (ZOCOR) 40 MG tablet TAKE 1 TABLET EVERY EVENING 90 tablet 6  . spironolactone (ALDACTONE) 25 MG tablet Take 1 tablet (25 mg total) by mouth daily. 90 tablet 2  . torsemide (DEMADEX) 20 MG tablet Take 3 tablets (60 mg total) by mouth 2 (two) times daily. 540 tablet 2  . venlafaxine XR (EFFEXOR-XR) 150 MG 24 hr capsule Take 150 mg by mouth daily with  breakfast.    . vitamin B-12 (CYANOCOBALAMIN) 1000 MCG tablet Take 6,000 mcg by mouth daily.    Marland Kitchen zolpidem (AMBIEN) 10 MG tablet Take 5 mg by mouth at bedtime as needed. For sleep     No current facility-administered medications for this encounter.    PHYSICAL EXAM: Vitals:   06/13/18 1139  BP: 93/77  Pulse: 88  SpO2: 100%  Weight: 96.3 kg (212 lb 4 oz)   Wt Readings from Last 3 Encounters:  06/13/18 96.3 kg (212 lb 4 oz)  05/06/18 98.6 kg (217 lb 6.4 oz)  12/15/17 94.2 kg (207 lb 12 oz)   General:  Well appearing. No resp difficulty HEENT: normal Neck: supple. JVP 9 Carotids 2+ bilat; no bruits. No lymphadenopathy or thryomegaly appreciated. Cor: PMI nondisplaced. Regular rate & rhythm. 2/6 TR Lungs: clear Abdomen: obese soft, nontender, nondistended. No hepatosplenomegaly. No bruits or masses. Good bowel sounds. Extremities: no cyanosis, clubbing, rash, trace-1+ edema Neuro: alert & orientedx3, cranial nerves grossly intact. moves all 4 extremities w/o difficulty. 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 EF 50-55% on echo 11/17 - Echo 9/19 EF 45% with septal dyssynergy (despite biv pacing). EF looks normal when septum not in view. That said there seems to be some septal flattening with moderate TR and I am worried about development of pulmonary HTN.  - NYHA II-III - Will plan RHC next week to further evaluate - Continues to struggle with volume status going up and down. Based on exam and Optivol - volume status now much better. Continue current diuretic regimen with torsemide 60 bid + spiro 25 daily + metolazone 2.5 on Fridays.  - Reinforced need for daily weights and reviewed use of sliding scale diuretics. - Check labs today   2) Medtronic BiV ICD:  - ICD interrogated personally in clinic. No VT/AF. Volume status improved. 100% Biv pacing - Followed by EP.   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 No  recent gout flares.   4) HTN - BP remains soft but asymptomatic.    Glori Bickers MD  06/13/2018 12:22 PM

## 2018-06-13 NOTE — Progress Notes (Signed)
Patient ID: [name], female   DOB: 1956/05/26, 62 y.o.   MRN: [id number]   ADVANCED HE CLINIC NOTE  PCP: Dr Raeanne Gathers  HPI: [name] is a 62 y/o woman with history of breast CA in 1998 treated with Adriamycin x 4 cycles. In 2007, diagnosed with CHF with EF 25-35%. She is s/p Medtronic BiV-ICD in 2008 with Sprint Fidelis lead which was subsequently replaced. LV function has subsequently recovered  Had cath 12/10. Coronaries normal. Right atrial pressure mean of 7, RV pressure 28/6 . Pulmonary artery pressure was 23/9 with mean of 15.  Pulmonary capillary wedge pressure mean of 9. Fick cardiac output 3.9 L per minute.  Cardiac index 2.2 L per minute per meter squared.  Pulmonary vascular resistance was 1.5 Woods units.  Follow up: Feels pretty good. Staying active going to grandkids sports games. Was bloated over the summer and now fluid much better. Saw Dr. Caryl Comes and had echo in 9/19 EF 40-45%. [name] increased to 25 daily. Breathing better. Occasional edema. Taking metolazone 2.5 every Friday.  No significant snoring.  ICD interrogation done personally in clinic:  No VT/AF. 100% biv pacing. Activity level 4 hours. Fluid level markedly up throughout entire July but now back down. Personally reviewed  Echo 05/10/18 EF 40-45% (I reviewed and felt 45%) Moderate TR with some septal flattening. RV ok  Personally reviewed   Echo 11/17: EF 50-55%   Echo 5/11: EF 50-55%  12/15/11 ECHO EF 50%. RV normal. 12/16/11 ICD generator replaced due to EOL 04/2013 EF 50% 07/19/14 Echo EF 50% inferior HK GLS -17.8 6/16 Echo EF 45-50   ROS: All systems negative except as listed in HPI, PMH and Problem List.  Past Medical History:  Diagnosis Date  . AICD (automatic cardioverter/defibrillator) present   . Biventricular implantable cardiac defibrillator)    Medtronic  . Breast cancer ([place])    s/p mastectomy and chemo with adriamycin (1998)  . CHF (congestive heart failure) (HCC)    takes Lasix and  Aldactone daily  . GERD (gastroesophageal reflux disease)    takes Omeprazole daily  . Gout    takes Allopurinol daily  . History of bronchitis 2 yrs ago  . History of shingles   . Hyperlipidemia    takes Simvastatin daily  . Insomnia    takes Ambien nightly  . Nonischemic cardiomyopathy ([place])    EF 55% echo 2012  . NSVT (nonsustained ventricular tachycardia) ([place])   . Obesity   . Pneumonia 13yrs ago  . Presence of permanent cardiac pacemaker   . Shortness of breath dyspnea    with exertion  . sprint Fidelis     Current Outpatient Medications  Medication Sig Dispense Refill  . allopurinol (ZYLOPRIM) 100 MG tablet Take 1 tablet by mouth daily.    . carvedilol (COREG) 12.5 MG tablet TAKE 1 AND 1/2 TABLETS TWICE DAILY WITH A MEAL 270 tablet 0  . Cholecalciferol (VITAMIN D3) 5000 units TABS Take 1 tablet by mouth daily as needed (vitamin supplement).     . citalopram (CELEXA) 40 MG tablet Take 40 mg by mouth daily.    Marland Kitchen losartan (COZAAR) 50 MG tablet Take 1 tablet (50 mg total) by mouth daily. 90 tablet 3  . metolazone (ZAROXOLYN) 2.5 MG tablet Take 2.5 mg by mouth once a week.    . multivitamin-iron-minerals-folic acid (THERAPEUTIC-M) TABS tablet Take 1 tablet by mouth daily.    Marland Kitchen omeprazole (PRILOSEC) 20 MG capsule Take 20 mg by mouth daily as needed.    Marland Kitchen  potassium chloride SA (K-DUR,KLOR-CON) 20 MEQ tablet Take 1 tablet (20 mEq total) by mouth 2 (two) times daily. Take 1 extra tab on Fridays with Metolazone 65 tablet 3  . ranitidine (ZANTAC 150 MAXIMUM STRENGTH) 150 MG tablet Take 150 mg by mouth 2 (two) times daily.    . simvastatin (ZOCOR) 40 MG tablet TAKE 1 TABLET EVERY EVENING 90 tablet 6  . spironolactone (ALDACTONE) 25 MG tablet Take 1 tablet (25 mg total) by mouth daily. 90 tablet 2  . torsemide (DEMADEX) 20 MG tablet Take 3 tablets (60 mg total) by mouth 2 (two) times daily. 540 tablet 2  . venlafaxine XR (EFFEXOR-XR) 150 MG 24 hr capsule Take 150 mg by mouth daily with  breakfast.    . vitamin B-12 (CYANOCOBALAMIN) 1000 MCG tablet Take 6,000 mcg by mouth daily.    Marland Kitchen zolpidem (AMBIEN) 10 MG tablet Take 5 mg by mouth at bedtime as needed. For sleep     No current facility-administered medications for this encounter.    PHYSICAL EXAM: Vitals:   06/13/18 1139  BP: 93/77  Pulse: 88  SpO2: 100%  Weight: 96.3 kg (212 lb 4 oz)   Wt Readings from Last 3 Encounters:  06/13/18 96.3 kg (212 lb 4 oz)  05/06/18 98.6 kg (217 lb 6.4 oz)  12/15/17 94.2 kg (207 lb 12 oz)   General:  Well appearing. No resp difficulty HEENT: normal Neck: supple. JVP 9 Carotids 2+ bilat; no bruits. No lymphadenopathy or thryomegaly appreciated. Cor: PMI nondisplaced. Regular rate & rhythm. 2/6 TR Lungs: clear Abdomen: obese soft, nontender, nondistended. No hepatosplenomegaly. No bruits or masses. Good bowel sounds. Extremities: no cyanosis, clubbing, rash, trace-1+ edema Neuro: alert & orientedx3, cranial nerves grossly intact. moves all 4 extremities w/o difficulty. 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 EF 50-55% on echo 11/17 - Echo 9/19 EF 45% with septal dyssynergy (despite biv pacing). EF looks normal when septum not in view. That said there seems to be some septal flattening with moderate TR and I am worried about development of pulmonary HTN.  - NYHA II-III - Will plan RHC next week to further evaluate - Continues to struggle with volume status going up and down. Based on exam and Optivol - volume status now much better. Continue current diuretic regimen with torsemide 60 bid + spiro 25 daily + metolazone 2.5 on Fridays.  - Reinforced need for daily weights and reviewed use of sliding scale diuretics. - Check labs today   2) Medtronic BiV ICD:  - ICD interrogated personally in clinic. No VT/AF. Volume status improved. 100% Biv pacing - Followed by EP.   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 No  recent gout flares.   4) HTN - BP remains soft but asymptomatic.    Glori Bickers MD  06/13/2018 12:22 PM

## 2018-06-20 ENCOUNTER — Ambulatory Visit (HOSPITAL_COMMUNITY)
Admission: RE | Admit: 2018-06-20 | Discharge: 2018-06-20 | Disposition: A | Discharge status: Home / Self Care | Payer: Medicare PPO | Source: Ambulatory Visit | Attending: Internal Medicine | Admitting: Internal Medicine

## 2018-06-20 ENCOUNTER — Other Ambulatory Visit: Payer: Self-pay

## 2018-06-20 ENCOUNTER — Encounter (HOSPITAL_COMMUNITY)
Admission: RE | Disposition: A | Discharge status: Home / Self Care | Payer: Self-pay | Source: Ambulatory Visit | Attending: Internal Medicine

## 2018-06-20 DIAGNOSIS — I110 Hypertensive heart disease with heart failure: Secondary | ICD-10-CM | POA: Diagnosis not present

## 2018-06-20 DIAGNOSIS — I5042 Chronic combined systolic (congestive) and diastolic (congestive) heart failure: Secondary | ICD-10-CM | POA: Diagnosis not present

## 2018-06-20 DIAGNOSIS — K219 Gastro-esophageal reflux disease without esophagitis: Secondary | ICD-10-CM | POA: Diagnosis not present

## 2018-06-20 DIAGNOSIS — Z95810 Presence of automatic (implantable) cardiac defibrillator: Secondary | ICD-10-CM | POA: Diagnosis not present

## 2018-06-20 DIAGNOSIS — I428 Other cardiomyopathies: Secondary | ICD-10-CM | POA: Diagnosis not present

## 2018-06-20 DIAGNOSIS — M109 Gout, unspecified: Secondary | ICD-10-CM | POA: Insufficient documentation

## 2018-06-20 DIAGNOSIS — E785 Hyperlipidemia, unspecified: Secondary | ICD-10-CM | POA: Insufficient documentation

## 2018-06-20 DIAGNOSIS — G4700 Insomnia, unspecified: Secondary | ICD-10-CM | POA: Insufficient documentation

## 2018-06-20 DIAGNOSIS — E669 Obesity, unspecified: Secondary | ICD-10-CM | POA: Insufficient documentation

## 2018-06-20 DIAGNOSIS — I5022 Chronic systolic (congestive) heart failure: Secondary | ICD-10-CM | POA: Diagnosis not present

## 2018-06-20 HISTORY — PX: RIGHT HEART CATH: CATH118263

## 2018-06-20 LAB — POCT I-STAT 3, VENOUS BLOOD GAS (G3P V)
Acid-Base Excess: 3 mmol/L — ABNORMAL HIGH (ref 0.0–2.0)
BICARBONATE: 25.9 mmol/L (ref 20.0–28.0)
Bicarbonate: 28.4 mmol/L — ABNORMAL HIGH (ref 20.0–28.0)
O2 SAT: 67 %
O2 Saturation: 66 %
PCO2 VEN: 44.4 mmHg (ref 44.0–60.0)
PH VEN: 7.374 (ref 7.250–7.430)
TCO2: 27 mmol/L (ref 22–32)
TCO2: 30 mmol/L (ref 22–32)
pCO2, Ven: 48.3 mmHg (ref 44.0–60.0)
pH, Ven: 7.378 (ref 7.250–7.430)
pO2, Ven: 36 mmHg (ref 32.0–45.0)
pO2, Ven: 36 mmHg (ref 32.0–45.0)

## 2018-06-20 SURGERY — RIGHT HEART CATH
Anesthesia: LOCAL

## 2018-06-20 MED ORDER — FENTANYL CITRATE (PF) 100 MCG/2ML IJ SOLN
INTRAMUSCULAR | Status: AC
  Filled 2018-06-20: qty 2

## 2018-06-20 MED ORDER — HEPARIN (PORCINE) IN NACL 1000-0.9 UT/500ML-% IV SOLN
INTRAVENOUS | Status: AC
  Filled 2018-06-20: qty 500

## 2018-06-20 MED ORDER — SODIUM CHLORIDE 0.9 % IV SOLN: 250.0000 mL | INTRAVENOUS | Status: DC | PRN

## 2018-06-20 MED ORDER — LIDOCAINE HCL (PF) 1 % IJ SOLN
INTRAMUSCULAR | Status: AC
  Filled 2018-06-20: qty 30

## 2018-06-20 MED ORDER — HEPARIN (PORCINE) IN NACL 1000-0.9 UT/500ML-% IV SOLN
INTRAVENOUS | Status: DC | PRN
  Administered 2018-06-20: 500 mL

## 2018-06-20 MED ORDER — SODIUM CHLORIDE 0.9% FLUSH: 3.0000 mL | INTRAVENOUS | Status: DC | PRN

## 2018-06-20 MED ORDER — LIDOCAINE HCL (PF) 1 % IJ SOLN
INTRAMUSCULAR | Status: DC | PRN
  Administered 2018-06-20: 15 mL via INTRADERMAL
  Administered 2018-06-20: 2 mL via INTRADERMAL

## 2018-06-20 MED ORDER — MIDAZOLAM HCL 2 MG/2ML IJ SOLN
INTRAMUSCULAR | Status: AC
  Filled 2018-06-20: qty 2

## 2018-06-20 MED ORDER — ONDANSETRON HCL 4 MG/2ML IJ SOLN: 4.0000 mg | Freq: Four times a day (QID) | INTRAMUSCULAR | Status: DC | PRN

## 2018-06-20 MED ORDER — SODIUM CHLORIDE 0.9% FLUSH: 3.0000 mL | Freq: Two times a day (BID) | INTRAVENOUS | Status: DC

## 2018-06-20 MED ORDER — SODIUM CHLORIDE 0.9 % IV SOLN
INTRAVENOUS | Status: DC
  Administered 2018-06-20: 09:00:00 via INTRAVENOUS

## 2018-06-20 MED ORDER — MIDAZOLAM HCL 2 MG/2ML IJ SOLN
INTRAMUSCULAR | Status: DC | PRN
  Administered 2018-06-20 (×2): 1 mg via INTRAVENOUS

## 2018-06-20 MED ORDER — ACETAMINOPHEN 325 MG PO TABS: 650.0000 mg | ORAL_TABLET | ORAL | Status: DC | PRN

## 2018-06-20 MED ORDER — FENTANYL CITRATE (PF) 100 MCG/2ML IJ SOLN
INTRAMUSCULAR | Status: DC | PRN
  Administered 2018-06-20: 25 ug via INTRAVENOUS

## 2018-06-20 SURGICAL SUPPLY — 12 items
CATH BALLN WEDGE 5F 110CM (CATHETERS) ×1 IMPLANT
CATH SWAN GANZ 7F STRAIGHT (CATHETERS) ×1 IMPLANT
GUIDEWIRE .025 260CM (WIRE) ×1 IMPLANT
GUIDEWIRE ANGLED .035X150CM (WIRE) IMPLANT
PACK CARDIAC CATHETERIZATION (CUSTOM PROCEDURE TRAY) ×2 IMPLANT
PROTECTION STATION PRESSURIZED (MISCELLANEOUS) ×2
SHEATH GLIDE SLENDER 4/5FR (SHEATH) ×1 IMPLANT
SHEATH PINNACLE 7F 10CM (SHEATH) ×1 IMPLANT
STATION PROTECTION PRESSURIZED (MISCELLANEOUS) IMPLANT
TRANSDUCER W/STOPCOCK (MISCELLANEOUS) ×2 IMPLANT
TUBING ART PRESS 72  MALE/MALE (TUBING) ×1 IMPLANT
WIRE EMERALD 3MM-J .025X260CM (WIRE) ×1 IMPLANT

## 2018-06-20 NOTE — Progress Notes (Addendum)
Patient's 7 fr left femoral venous sheath was pulled per order. Manual pressure was held for 20 minutes. Patient tolerated pull well. Sheath site during and post sheath pull was clean, dry, intact and had no sign of a hematoma. Patients VS remained WNL's during and post sheath pull. Patients bedrest began at 1300 and ends in 4 fours at 1700. Vascular site assessment was a level 0. Sheath removal site was dressed with a gauze dressing and was clean, dry, and intact. Patient was educated about post sheath pull management and stated he understood and had no questions.  Patient's 4 fr right brachial venous sheath was pulled per order. Manual pressure was held for 20 minutes. Patient tolerated pull well. Sheath site during and post sheath pull was clean, dry, intact and had no sign of a hematoma. Patients VS remained WNL's during and post sheath pull. Patients bedrest began at 1300 and ends in 4 fours at 1700. Vascular site assessment was a level 0. Sheath removal site was dressed with a gauze dressing and was clean, dry, and intact. Patient was educated about post sheath pull management and stated he understood and had no questions.

## 2018-06-20 NOTE — Discharge Instructions (Signed)

## 2018-06-20 NOTE — Interval H&P Note (Signed)
History and Physical Interval Note:  06/20/2018 11:13 AM  [name]  has presented today for surgery, with the diagnosis of hf  The various methods of treatment have been discussed with the patient and family. After consideration of risks, benefits and other options for treatment, the patient has consented to  Procedure(s): RIGHT HEART CATH (N/A) as a surgical intervention .  The patient's history has been reviewed, patient examined, no change in status, stable for surgery.  I have reviewed the patient's chart and labs.  Questions were answered to the patient's satisfaction.     Gale Klar

## 2018-06-21 ENCOUNTER — Encounter (HOSPITAL_COMMUNITY): Payer: Self-pay | Admitting: Internal Medicine

## 2018-07-04 ENCOUNTER — Ambulatory Visit (INDEPENDENT_AMBULATORY_CARE_PROVIDER_SITE_OTHER): Payer: Medicare PPO | Admitting: *Deleted

## 2018-07-04 DIAGNOSIS — I5022 Chronic systolic (congestive) heart failure: Secondary | ICD-10-CM

## 2018-07-04 DIAGNOSIS — I428 Other cardiomyopathies: Secondary | ICD-10-CM

## 2018-07-04 NOTE — Progress Notes (Signed)
Remote ICD transmission.   

## 2018-07-05 ENCOUNTER — Other Ambulatory Visit: Payer: Self-pay | Admitting: Internal Medicine

## 2018-08-28 LAB — CUP PACEART REMOTE DEVICE CHECK
Brady Statistic AP VS Percent: 0.01 %
Brady Statistic AS VP Percent: 93.83 %
Brady Statistic AS VS Percent: 0.04 %
Brady Statistic RA Percent Paced: 6.11 %
Date Time Interrogation Session: 20191028084223
HIGH POWER IMPEDANCE MEASURED VALUE: 69 Ohm
Implantable Lead Implant Date: 20070821
Implantable Lead Implant Date: 20070821
Implantable Lead Location: 753858
Implantable Lead Location: 753860
Implantable Pulse Generator Implant Date: 20160211
Lead Channel Impedance Value: 171 Ohm
Lead Channel Impedance Value: 342 Ohm
Lead Channel Impedance Value: 437 Ohm
Lead Channel Impedance Value: 589 Ohm
Lead Channel Impedance Value: 627 Ohm
Lead Channel Pacing Threshold Amplitude: 0.375 V
Lead Channel Pacing Threshold Amplitude: 1.125 V
Lead Channel Pacing Threshold Pulse Width: 0.4 ms
Lead Channel Pacing Threshold Pulse Width: 0.4 ms
Lead Channel Sensing Intrinsic Amplitude: 16.75 mV
Lead Channel Sensing Intrinsic Amplitude: 2 mV
Lead Channel Setting Pacing Amplitude: 1.75 V
Lead Channel Setting Pacing Amplitude: 2.25 V
Lead Channel Setting Pacing Pulse Width: 0.4 ms
Lead Channel Setting Pacing Pulse Width: 0.4 ms
Lead Channel Setting Sensing Sensitivity: 0.3 mV
MDC IDC LEAD IMPLANT DT: 20160211
MDC IDC LEAD LOCATION: 753859
MDC IDC MSMT BATTERY REMAINING LONGEVITY: 37 mo
MDC IDC MSMT BATTERY VOLTAGE: 2.96 V
MDC IDC MSMT LEADCHNL LV IMPEDANCE VALUE: 665 Ohm
MDC IDC MSMT LEADCHNL LV PACING THRESHOLD PULSEWIDTH: 0.4 ms
MDC IDC MSMT LEADCHNL RA SENSING INTR AMPL: 2 mV
MDC IDC MSMT LEADCHNL RV PACING THRESHOLD AMPLITUDE: 1.5 V
MDC IDC MSMT LEADCHNL RV SENSING INTR AMPL: 16.75 mV
MDC IDC SET LEADCHNL LV PACING AMPLITUDE: 1.5 V
MDC IDC STAT BRADY AP VP PERCENT: 6.12 %
MDC IDC STAT BRADY RV PERCENT PACED: 99.53 %

## 2018-08-29 ENCOUNTER — Other Ambulatory Visit (HOSPITAL_COMMUNITY): Payer: Self-pay | Admitting: Internal Medicine

## 2018-10-03 ENCOUNTER — Ambulatory Visit (INDEPENDENT_AMBULATORY_CARE_PROVIDER_SITE_OTHER): Payer: Medicare PPO

## 2018-10-03 DIAGNOSIS — I428 Other cardiomyopathies: Secondary | ICD-10-CM

## 2018-10-03 DIAGNOSIS — I5022 Chronic systolic (congestive) heart failure: Secondary | ICD-10-CM | POA: Diagnosis not present

## 2018-10-04 LAB — CUP PACEART REMOTE DEVICE CHECK
Brady Statistic AP VS Percent: 0.01 %
Brady Statistic AS VS Percent: 0.05 %
Date Time Interrogation Session: 20200128053625
HIGH POWER IMPEDANCE MEASURED VALUE: 86 Ohm
Implantable Lead Implant Date: 20070821
Implantable Lead Implant Date: 20070821
Implantable Lead Location: 753858
Implantable Lead Location: 753860
Implantable Pulse Generator Implant Date: 20160211
Lead Channel Impedance Value: 171 Ohm
Lead Channel Impedance Value: 494 Ohm
Lead Channel Impedance Value: 703 Ohm
Lead Channel Pacing Threshold Amplitude: 0.375 V
Lead Channel Pacing Threshold Pulse Width: 0.4 ms
Lead Channel Sensing Intrinsic Amplitude: 17 mV
Lead Channel Setting Pacing Amplitude: 1.75 V
Lead Channel Setting Pacing Amplitude: 2.25 V
Lead Channel Setting Pacing Pulse Width: 0.4 ms
Lead Channel Setting Pacing Pulse Width: 0.4 ms
Lead Channel Setting Sensing Sensitivity: 0.3 mV
MDC IDC LEAD IMPLANT DT: 20160211
MDC IDC LEAD LOCATION: 753859
MDC IDC MSMT BATTERY REMAINING LONGEVITY: 38 mo
MDC IDC MSMT BATTERY VOLTAGE: 2.96 V
MDC IDC MSMT LEADCHNL LV IMPEDANCE VALUE: 665 Ohm
MDC IDC MSMT LEADCHNL LV IMPEDANCE VALUE: 760 Ohm
MDC IDC MSMT LEADCHNL LV PACING THRESHOLD PULSEWIDTH: 0.4 ms
MDC IDC MSMT LEADCHNL RA IMPEDANCE VALUE: 380 Ohm
MDC IDC MSMT LEADCHNL RA PACING THRESHOLD AMPLITUDE: 1.125 V
MDC IDC MSMT LEADCHNL RA SENSING INTR AMPL: 1.375 mV
MDC IDC MSMT LEADCHNL RA SENSING INTR AMPL: 1.375 mV
MDC IDC MSMT LEADCHNL RV PACING THRESHOLD AMPLITUDE: 1.5 V
MDC IDC MSMT LEADCHNL RV PACING THRESHOLD PULSEWIDTH: 0.4 ms
MDC IDC MSMT LEADCHNL RV SENSING INTR AMPL: 17 mV
MDC IDC SET LEADCHNL LV PACING AMPLITUDE: 1.5 V
MDC IDC STAT BRADY AP VP PERCENT: 6.18 %
MDC IDC STAT BRADY AS VP PERCENT: 93.76 %
MDC IDC STAT BRADY RA PERCENT PACED: 6.17 %
MDC IDC STAT BRADY RV PERCENT PACED: 99.5 %

## 2018-10-04 NOTE — Progress Notes (Signed)
Remote ICD transmission.   

## 2018-10-27 ENCOUNTER — Other Ambulatory Visit: Payer: Self-pay | Admitting: Internal Medicine

## 2018-10-28 ENCOUNTER — Other Ambulatory Visit: Payer: Self-pay | Admitting: Internal Medicine

## 2018-10-28 MED ORDER — CARVEDILOL 12.5 MG PO TABS: ORAL_TABLET | ORAL | 0 refills | Status: DC

## 2018-10-28 MED ORDER — CARVEDILOL 12.5 MG PO TABS: ORAL_TABLET | ORAL | 1 refills | Status: DC

## 2018-10-28 NOTE — Telephone Encounter (Signed)
Pt's medication was sent to pt's pharmacy as requested. A 10 day supply was also sent into local pharmacy until mail order arrives. Confirmation received.

## 2018-12-16 ENCOUNTER — Other Ambulatory Visit: Payer: Self-pay | Admitting: Nurse Practitioner

## 2018-12-16 ENCOUNTER — Other Ambulatory Visit: Payer: Self-pay | Admitting: Cardiology

## 2018-12-16 NOTE — Telephone Encounter (Signed)
Ok to refill x1 - needs virtual office visit with Dr Caryl Comes in next couple of weeks.

## 2018-12-19 ENCOUNTER — Other Ambulatory Visit: Payer: Self-pay

## 2018-12-19 ENCOUNTER — Telehealth: Payer: Self-pay

## 2018-12-19 MED ORDER — SPIRONOLACTONE 25 MG PO TABS: 25.0000 mg | ORAL_TABLET | Freq: Every day | ORAL | 0 refills | Status: DC

## 2018-12-19 NOTE — Telephone Encounter (Signed)
Lpm letting her know that medication has been refilled and that she should be receiving a call about scheduling a visit with Dr Caryl Comes.

## 2019-01-02 ENCOUNTER — Other Ambulatory Visit: Payer: Self-pay

## 2019-01-02 ENCOUNTER — Ambulatory Visit (INDEPENDENT_AMBULATORY_CARE_PROVIDER_SITE_OTHER): Payer: Medicare PPO | Admitting: *Deleted

## 2019-01-02 DIAGNOSIS — I5022 Chronic systolic (congestive) heart failure: Secondary | ICD-10-CM | POA: Diagnosis not present

## 2019-01-02 DIAGNOSIS — I428 Other cardiomyopathies: Secondary | ICD-10-CM

## 2019-01-03 LAB — CUP PACEART REMOTE DEVICE CHECK
Battery Remaining Longevity: 32 mo
Battery Voltage: 2.96 V
Brady Statistic AP VP Percent: 8.45 %
Brady Statistic AP VS Percent: 0.01 %
Brady Statistic AS VP Percent: 91.5 %
Brady Statistic AS VS Percent: 0.05 %
Brady Statistic RA Percent Paced: 8.41 %
Brady Statistic RV Percent Paced: 99.29 %
Date Time Interrogation Session: 20200427063323
HighPow Impedance: 82 Ohm
Implantable Lead Implant Date: 20070821
Implantable Lead Implant Date: 20070821
Implantable Lead Implant Date: 20160211
Implantable Lead Location: 753858
Implantable Lead Location: 753859
Implantable Lead Location: 753860
Implantable Lead Model: 4194
Implantable Lead Model: 5076
Implantable Lead Model: 6935
Implantable Pulse Generator Implant Date: 20160211
Lead Channel Impedance Value: 171 Ohm
Lead Channel Impedance Value: 342 Ohm
Lead Channel Impedance Value: 437 Ohm
Lead Channel Impedance Value: 646 Ohm
Lead Channel Impedance Value: 665 Ohm
Lead Channel Impedance Value: 665 Ohm
Lead Channel Pacing Threshold Amplitude: 0.375 V
Lead Channel Pacing Threshold Amplitude: 1.125 V
Lead Channel Pacing Threshold Amplitude: 1.25 V
Lead Channel Pacing Threshold Pulse Width: 0.4 ms
Lead Channel Pacing Threshold Pulse Width: 0.4 ms
Lead Channel Pacing Threshold Pulse Width: 0.4 ms
Lead Channel Sensing Intrinsic Amplitude: 1.375 mV
Lead Channel Sensing Intrinsic Amplitude: 1.375 mV
Lead Channel Sensing Intrinsic Amplitude: 18.375 mV
Lead Channel Sensing Intrinsic Amplitude: 18.375 mV
Lead Channel Setting Pacing Amplitude: 1.5 V
Lead Channel Setting Pacing Amplitude: 1.75 V
Lead Channel Setting Pacing Amplitude: 2 V
Lead Channel Setting Pacing Pulse Width: 0.4 ms
Lead Channel Setting Pacing Pulse Width: 0.4 ms
Lead Channel Setting Sensing Sensitivity: 0.3 mV

## 2019-01-06 ENCOUNTER — Other Ambulatory Visit: Payer: Self-pay

## 2019-01-06 ENCOUNTER — Telehealth (INDEPENDENT_AMBULATORY_CARE_PROVIDER_SITE_OTHER): Payer: Medicare PPO | Admitting: Internal Medicine

## 2019-01-06 VITALS — BP 107/72 | HR 83 | Ht 61.0 in | Wt 207.0 lb

## 2019-01-06 DIAGNOSIS — Z95810 Presence of automatic (implantable) cardiac defibrillator: Secondary | ICD-10-CM

## 2019-01-06 DIAGNOSIS — I472 Ventricular tachycardia, unspecified: Secondary | ICD-10-CM

## 2019-01-06 DIAGNOSIS — I428 Other cardiomyopathies: Secondary | ICD-10-CM

## 2019-01-06 DIAGNOSIS — I5022 Chronic systolic (congestive) heart failure: Secondary | ICD-10-CM

## 2019-01-06 DIAGNOSIS — E876 Hypokalemia: Secondary | ICD-10-CM

## 2019-01-06 NOTE — Progress Notes (Signed)
Electrophysiology TeleHealth Note   Due to national recommendations of social distancing due to COVID 19, an audio/video telehealth visit is felt to be most appropriate for this patient at this time.  See MyChart message from today for the patient's consent to telehealth for [facility].   Date:  01/06/2019   ID:  [name], DOB 04-14-1956, MRN [id number]  Location: patient's home  Provider location: [address] [place]  Evaluation Performed: Follow-up visit  PCP:  Wayna Chalet, NP  Cardiologist:   DB Electrophysiologist:  SK   Chief Complaint:  cardiomyopathy  History of Present Illness:    [name] is a 63 y.o. female who presents via audio/video conferencing for a telehealth visit today.  Since last being seen in our clinic, the patient reports few problems  The patient denies chest pain, nocturnal dyspnea  orthopnea*  She has mild stable shortness of breath.  Intermittent edema which she addresses by drinking MORE fluids ( to help herself urinate)  Salt averse  There have been brief  Palpitations but no*, lightheadedness  or syncope     9/19 Echo EF 40-45 % Mod LAE 10/19 RHC  Mild elevation of R sided pressures ? TR   The patient denies symptoms of fevers, chills, cough, or new SOB worrisome for COVID 19.    Past Medical History:  Diagnosis Date  . AICD (automatic cardioverter/defibrillator) present   . Biventricular implantable cardiac defibrillator)    Medtronic  . Breast cancer ([place])    s/p mastectomy and chemo with adriamycin (1998)  . CHF (congestive heart failure) (HCC)    takes Lasix and Aldactone daily  . GERD (gastroesophageal reflux disease)    takes Omeprazole daily  . Gout    takes Allopurinol daily  . History of bronchitis 2 yrs ago  . History of shingles   . Hyperlipidemia    takes Simvastatin daily  . Insomnia    takes Ambien nightly  . Nonischemic cardiomyopathy ([place])    EF 55% echo 2012  .  NSVT (nonsustained ventricular tachycardia) ([place])   . Obesity   . Pneumonia 2yrs ago  . Presence of permanent cardiac pacemaker   . Shortness of breath dyspnea    with exertion  . sprint Fidelis     Past Surgical History:  Procedure Laterality Date  . ABDOMINAL HYSTERECTOMY    . APPENDECTOMY  1978  . BiV ICD  2007   Medtronic  . BIV ICD GENERTAOR CHANGE OUT N/A 12/16/2011   Procedure: BIV ICD GENERTAOR CHANGE OUT;  Surgeon: [name], MD;  Location: [facility] CATH LAB;  Service: Cardiovascular;  Laterality: N/A;  . COLONOSCOPY    . cyst removed from wrist Left    early 70's  . ICD LEAD REMOVAL Right 10/18/2014   Procedure: ICD LEAD REMOVAL/EXTRACTION/REIMPLANTATION;  Surgeon: Evans Lance, MD;  Location: [facility];  Service: Cardiovascular;  Laterality: Right;  . MASTECTOMY Left 1998  . port a cath placed  1998  . port removed  1998  . RIGHT HEART CATH N/A 06/20/2018   Procedure: RIGHT HEART CATH;  Surgeon: Jolaine Artist, MD;  Location: [facility] CV LAB;  Service: Cardiovascular;  Laterality: N/A;    Current Outpatient Medications  Medication Sig Dispense Refill  . allopurinol (ZYLOPRIM) 100 MG tablet Take 100 mg by mouth daily.     . carvedilol (COREG) 12.5 MG tablet TAKE 1 AND 1/2 TABLETS TWICE DAILY WITH A MEAL 270  tablet 1  . Cholecalciferol (VITAMIN D3) 5000 units TABS Take 5,000 Units by mouth daily.     . citalopram (CELEXA) 40 MG tablet Take 20 mg by mouth daily.     Marland Kitchen losartan (COZAAR) 50 MG tablet Take 1 tablet (50 mg total) by mouth daily. 90 tablet 3  . metolazone (ZAROXOLYN) 2.5 MG tablet TAKE 1 TABLET EVERY WEEK 13 tablet 0  . multivitamin-iron-minerals-folic acid (THERAPEUTIC-M) TABS tablet Take 1 tablet by mouth daily.    Marland Kitchen omeprazole (PRILOSEC) 20 MG capsule Take 20 mg by mouth daily at 12 noon.     . potassium chloride SA (K-DUR,KLOR-CON) 20 MEQ tablet Take 1 tablet (20 mEq total) by mouth 2 (two) times daily. Take 1 extra tab on Fridays with Metolazone  (Patient taking differently: Take 40 mEq by mouth 2 (two) times daily. Take 1 extra tab on Fridays with Metolazone) 65 tablet 3  . simvastatin (ZOCOR) 40 MG tablet TAKE 1 TABLET EVERY EVENING (Patient taking differently: Take 40 mg by mouth every evening. ) 90 tablet 6  . spironolactone (ALDACTONE) 25 MG tablet Take 1 tablet (25 mg total) by mouth daily. Pt must call and make appt in order to continue getting refills. 90 tablet 0  . torsemide (DEMADEX) 20 MG tablet TAKE 3 TABLETS TWICE DAILY 540 tablet 2  . venlafaxine XR (EFFEXOR-XR) 150 MG 24 hr capsule Take 150 mg by mouth daily with breakfast.    . vitamin B-12 (CYANOCOBALAMIN) 100 MCG tablet Take 150 mcg by mouth daily.     Marland Kitchen zolpidem (AMBIEN) 10 MG tablet Take 5 mg by mouth at bedtime as needed. For sleep     No current facility-administered medications for this visit.     Allergies:   Buprenorphine hcl; Morphine and related; Percocet [oxycodone-acetaminophen]; and Oxycodone   Social History:  The patient  reports that she has never smoked. She has never used smokeless tobacco. She reports that she does not drink alcohol or use drugs.   Family History:  The patient's   family history includes Alzheimer's disease in her father; Diabetes in her mother; Heart disease in her father.   ROS:  Please see the history of present illness.   All other systems are personally reviewed and negative.    Exam:    Vital Signs:  BP 107/72   Pulse 83   Ht 5\' 1"  (1.549 m)   Wt 207 lb (93.9 kg)   BMI 39.11 kg/m     Well appearing, alert and conversant, regular work of breathing,  good skin color Eyes- anicteric, neuro- grossly intact, skin- no apparent rash or lesions or cyanosis, mouth- oral mucosa is pink   Labs/Other Tests and Data Reviewed:    Recent Labs: 05/06/2018: ALT 16; NT-Pro BNP 1,293 06/13/2018: B Natriuretic Peptide 245.5; BUN 17; Creatinine, Ser 1.01; Hemoglobin 12.5; Platelets 260; Potassium 4.1; Sodium 139  Personally reviewed    Wt Readings from Last 3 Encounters:  01/06/19 207 lb (93.9 kg)  06/20/18 207 lb (93.9 kg)  06/13/18 212 lb 4 oz (96.3 kg)     Other studies personally reviewed: Additional studies/ records that were reviewed today include:    Last device remote is reviewed from [facility] PDF dated 4/20  which reveals normal device function,   arrhythmias - SCAF and VT NS   ASSESSMENT & PLAN:    Nonischemic cardiomyopathy  Congestive heart failure-chronic systolic grade 2-3  Defibrillator lead failure with prior extraction  Potassium low  VT nonsustained  Atrial fibrillation-- sub clinical SCAF   Implantable defibrillator-CRT-Medtronic  VT nonsustained present   This may be causing her symptomatic palpitations or the SCAF might be, suspect former-- will check Mg and K   SCAF also noted but less than 1 hr-- no clear indication  for anticoagulation   Mild volume overload  We have discussed the physiology of heart failure including the importance of salt restriction and fluid restriction   With propensity to hypokalemia asked her to clarify the most recent K and also whether MG was checked ( none on record)   BP well controlled   Needs f/u with CHF clinic  COVID 19 screen The patient denies symptoms of COVID 19 at this time.  The importance of social distancing was discussed today.    WE will call her next week to get most recent K from PCP and figure out about Mg testing, done yes or no-- if not will need to b e done   Follow-up:  12 m Next remote: As Scheduled   Current medicines are reviewed at length with the patient today.   The patient does not have concerns regarding her medicines.  The following changes were made today:  none  Labs/ tests ordered today include:   No orders of the defined types were placed in this encounter.   Future tests ( post COVID )     Patient Risk:  after full review of this patients clinical status, I feel that they are at moderate risk at this  time.  Today, I have spent 10 minutes with the patient with telehealth technology discussing the above.  Signed, Virl Axe, MD  01/06/2019 9:46 AM      [facility] [address] [place] [address] [phone] (office) [phone] (fax)

## 2019-01-10 NOTE — Progress Notes (Signed)
Remote ICD transmission.   

## 2019-01-16 ENCOUNTER — Other Ambulatory Visit (HOSPITAL_COMMUNITY): Payer: Self-pay | Admitting: Internal Medicine

## 2019-01-16 ENCOUNTER — Other Ambulatory Visit (HOSPITAL_COMMUNITY): Payer: Self-pay | Admitting: Vascular Surgery

## 2019-01-27 ENCOUNTER — Other Ambulatory Visit (HOSPITAL_COMMUNITY): Payer: Self-pay | Admitting: Internal Medicine

## 2019-01-27 ENCOUNTER — Other Ambulatory Visit: Payer: Self-pay | Admitting: Nurse Practitioner

## 2019-01-27 ENCOUNTER — Other Ambulatory Visit: Payer: Self-pay | Admitting: Internal Medicine

## 2019-01-27 ENCOUNTER — Other Ambulatory Visit: Payer: Self-pay | Admitting: Cardiology

## 2019-02-13 ENCOUNTER — Other Ambulatory Visit: Payer: Self-pay | Admitting: Cardiology

## 2019-03-21 ENCOUNTER — Other Ambulatory Visit (HOSPITAL_COMMUNITY): Payer: Self-pay | Admitting: Cardiology

## 2019-04-03 ENCOUNTER — Ambulatory Visit (INDEPENDENT_AMBULATORY_CARE_PROVIDER_SITE_OTHER): Payer: Medicare PPO | Admitting: *Deleted

## 2019-04-03 DIAGNOSIS — I472 Ventricular tachycardia: Secondary | ICD-10-CM

## 2019-04-03 DIAGNOSIS — I4729 Other ventricular tachycardia: Secondary | ICD-10-CM

## 2019-04-03 DIAGNOSIS — I5022 Chronic systolic (congestive) heart failure: Secondary | ICD-10-CM | POA: Diagnosis not present

## 2019-04-03 LAB — CUP PACEART REMOTE DEVICE CHECK
Battery Remaining Longevity: 30 mo
Battery Voltage: 2.95 V
Brady Statistic AP VP Percent: 10.78 %
Brady Statistic AP VS Percent: 0.01 %
Brady Statistic AS VP Percent: 89.1 %
Brady Statistic AS VS Percent: 0.12 %
Brady Statistic RA Percent Paced: 10.64 %
Brady Statistic RV Percent Paced: 98.66 %
Date Time Interrogation Session: 20200727084223
HighPow Impedance: 67 Ohm
Implantable Lead Implant Date: 20070821
Implantable Lead Implant Date: 20070821
Implantable Lead Implant Date: 20160211
Implantable Lead Location: 753858
Implantable Lead Location: 753859
Implantable Lead Location: 753860
Implantable Lead Model: 4194
Implantable Lead Model: 5076
Implantable Lead Model: 6935
Implantable Pulse Generator Implant Date: 20160211
Lead Channel Impedance Value: 133 Ohm
Lead Channel Impedance Value: 342 Ohm
Lead Channel Impedance Value: 437 Ohm
Lead Channel Impedance Value: 532 Ohm
Lead Channel Impedance Value: 570 Ohm
Lead Channel Impedance Value: 627 Ohm
Lead Channel Pacing Threshold Amplitude: 0.375 V
Lead Channel Pacing Threshold Amplitude: 1.125 V
Lead Channel Pacing Threshold Amplitude: 1.625 V
Lead Channel Pacing Threshold Pulse Width: 0.4 ms
Lead Channel Pacing Threshold Pulse Width: 0.4 ms
Lead Channel Pacing Threshold Pulse Width: 0.4 ms
Lead Channel Sensing Intrinsic Amplitude: 0.75 mV
Lead Channel Sensing Intrinsic Amplitude: 0.75 mV
Lead Channel Sensing Intrinsic Amplitude: 21.375 mV
Lead Channel Sensing Intrinsic Amplitude: 21.375 mV
Lead Channel Setting Pacing Amplitude: 1.5 V
Lead Channel Setting Pacing Amplitude: 1.75 V
Lead Channel Setting Pacing Amplitude: 2.5 V
Lead Channel Setting Pacing Pulse Width: 0.4 ms
Lead Channel Setting Pacing Pulse Width: 0.4 ms
Lead Channel Setting Sensing Sensitivity: 0.3 mV

## 2019-04-04 ENCOUNTER — Encounter (HOSPITAL_COMMUNITY): Payer: Self-pay

## 2019-04-18 NOTE — Progress Notes (Signed)
Carelink Summary Report / Loop Recorder 

## 2019-05-03 ENCOUNTER — Other Ambulatory Visit (HOSPITAL_COMMUNITY): Payer: Self-pay | Admitting: Internal Medicine

## 2019-05-10 ENCOUNTER — Encounter (HOSPITAL_COMMUNITY): Payer: Self-pay | Admitting: Internal Medicine

## 2019-05-10 ENCOUNTER — Other Ambulatory Visit: Payer: Self-pay

## 2019-05-10 ENCOUNTER — Ambulatory Visit (HOSPITAL_COMMUNITY)
Admission: RE | Admit: 2019-05-10 | Discharge: 2019-05-10 | Disposition: A | Discharge status: Home / Self Care | Payer: Medicare PPO | Source: Ambulatory Visit | Attending: Internal Medicine | Admitting: Internal Medicine

## 2019-05-10 VITALS — BP 102/37 | HR 91 | Wt 216.0 lb

## 2019-05-10 DIAGNOSIS — I110 Hypertensive heart disease with heart failure: Secondary | ICD-10-CM | POA: Diagnosis not present

## 2019-05-10 DIAGNOSIS — R0683 Snoring: Secondary | ICD-10-CM | POA: Insufficient documentation

## 2019-05-10 DIAGNOSIS — I5032 Chronic diastolic (congestive) heart failure: Secondary | ICD-10-CM | POA: Insufficient documentation

## 2019-05-10 DIAGNOSIS — E785 Hyperlipidemia, unspecified: Secondary | ICD-10-CM | POA: Diagnosis not present

## 2019-05-10 DIAGNOSIS — G4700 Insomnia, unspecified: Secondary | ICD-10-CM | POA: Insufficient documentation

## 2019-05-10 DIAGNOSIS — Z95810 Presence of automatic (implantable) cardiac defibrillator: Secondary | ICD-10-CM | POA: Diagnosis not present

## 2019-05-10 DIAGNOSIS — K219 Gastro-esophageal reflux disease without esophagitis: Secondary | ICD-10-CM | POA: Diagnosis not present

## 2019-05-10 DIAGNOSIS — Z79899 Other long term (current) drug therapy: Secondary | ICD-10-CM | POA: Insufficient documentation

## 2019-05-10 DIAGNOSIS — I5022 Chronic systolic (congestive) heart failure: Secondary | ICD-10-CM

## 2019-05-10 DIAGNOSIS — I10 Essential (primary) hypertension: Secondary | ICD-10-CM

## 2019-05-10 LAB — BASIC METABOLIC PANEL
Anion gap: 12 (ref 5–15)
BUN: 21 mg/dL (ref 8–23)
CO2: 22 mmol/L (ref 22–32)
Calcium: 9.4 mg/dL (ref 8.9–10.3)
Chloride: 103 mmol/L (ref 98–111)
Creatinine, Ser: 1.14 mg/dL — ABNORMAL HIGH (ref 0.44–1.00)
GFR calc Af Amer: 58 mL/min — ABNORMAL LOW (ref >60)
GFR calc non Af Amer: 50 mL/min — ABNORMAL LOW (ref >60)
Glucose, Bld: 98 mg/dL (ref 70–99)
Potassium: 5 mmol/L (ref 3.5–5.1)
Sodium: 137 mmol/L (ref 135–145)

## 2019-05-10 LAB — BRAIN NATRIURETIC PEPTIDE: B Natriuretic Peptide: 304.4 pg/mL — ABNORMAL HIGH (ref 0.0–100.0)

## 2019-05-10 MED ORDER — METOLAZONE 2.5 MG PO TABS: 2.5000 mg | ORAL_TABLET | ORAL | 6 refills | Status: DC

## 2019-05-10 NOTE — Progress Notes (Signed)
Patient Name: [name]        DOB: May 04, 2056     Height: 5'1   Weight: 216 lb   Office Name: [facility]         Referring Provider: Dr Haroldine Laws Today's Date: 05/10/19  Date:   STOP BANG RISK ASSESSMENT S (snore) Have you been told that you snore?     YES   T (tired) Are you often tired, fatigued, or sleepy during the day?   YES  O (obstruction) Do you stop breathing, choke, or gasp during sleep? NO   P (pressure) Do you have or are you being treated for high blood pressure? NO   B (BMI) Is your body index greater than 35 kg/m? YES   A (age) Are you 65 years old or older? YES   N (neck) Do you have a neck circumference greater than 16 inches?   NA   G (gender) Are you a female? NO   TOTAL STOP/BANG "YES" ANSWERS 4                                                                       For Office Use Only              Procedure Order Form    YES to 3+ Stop Bang questions OR two clinical symptoms - patient qualifies for WatchPAT (CPT 95800)     Submit: This Form + Patient Face Sheet + Clinical Note via CloudPAT or Fax: [phone]         Clinical Notes: Will consult Sleep Specialist and refer for management of therapy due to patient increased risk of Sleep Apnea. Ordering a sleep study due to the following two clinical symptoms: Excessive daytime sleepiness G47.10 / Gastroesophageal reflux K21.9 / Nocturia R35.1 / Morning Headaches G44.221 / Difficulty concentrating R41.840 / Memory problems or poor judgment G31.84 / Personality changes or irritability R45.4 / Loud snoring R06.83 / Depression F32.9 / Unrefreshed by sleep G47.8 / Impotence N52.9 / History of high blood pressure R03.0 / Insomnia G47.00    I understand that I am proceeding with a home sleep apnea test as ordered by my treating physician. I understand that untreated sleep apnea is a serious cardiovascular risk factor and it is my responsibility to perform the test and seek management for  sleep apnea. I will be contacted with the results and be managed for sleep apnea by a local sleep physician. I will be receiving equipment and further instructions from [facility]. I shall promptly ship back the equipment via the included mailing label. I understand my insurance will be billed for the test and as the patient I am responsible for any insurance related out-of-pocket costs incurred. I have been provided with written instructions and can call for additional video or telephonic instruction, with 24-hour availability of qualified personnel to answer any questions: Patient Help Desk [phone].  Patient Signature ______________________________________________________   Date______________________ Patient Telemedicine Verbal Consent

## 2019-05-10 NOTE — Patient Instructions (Signed)
INCREASE Metolazone to 2.5mg  (1 tab) Monday and Friday)  Your provider has recommended that you have a home sleep study.  [employer] is the company that provides these and will send the equipment right to your home with instructions on how to set it up.  Once you have completed the test you just dispose of the equipment, the information is automatically uploaded to [place].  IF you have any questions or issues with the equipment please call the company.  If your test was positive and you need a home CPAP machine you will be contacted by Dr Theodosia Blender office [facility]) to set this up.  Labs today and repeat in 2-3 weeks We will only contact you if something comes back abnormal or we need to make some changes. Otherwise no news is good news!  Your physician recommends that you schedule a follow-up appointment in: 2 months with Dr Haroldine Laws  At the [facility], you and your health needs are our priority. As part of our continuing mission to provide you with exceptional heart care, we have created designated Provider Care Teams. These Care Teams include your primary Cardiologist (physician) and Advanced Practice Providers (APPs- Physician Assistants and Nurse Practitioners) who all work together to provide you with the care you need, when you need it.   You may see any of the following providers on your designated Care Team at your next follow up: Marland Kitchen Dr Glori Bickers . Dr Loralie Champagne . Darrick Grinder, NP   Please be sure to bring in all your medications bottles to every appointment.

## 2019-05-10 NOTE — Progress Notes (Signed)
Patient ID: [name], female   DOB: 1956/04/26, 63 y.o.   MRN: [id number]   ADVANCED HE CLINIC NOTE  PCP: Dr Raeanne Gathers  HPI: [name] is a 63 y/o woman with history of breast CA in 1998 treated with Adriamycin x 4 cycles. In 2007, diagnosed with CHF with EF 25-35%. She is s/p Medtronic BiV-ICD in 2008 with Sprint Fidelis lead which was subsequently replaced. LV function has subsequently recovered  Had cath 12/10. Coronaries normal. Right atrial pressure mean of 7, RV pressure 28/6 . Pulmonary artery pressure was 23/9 with mean of 15.  Pulmonary capillary wedge pressure mean of 9. Fick cardiac output 3.9 L per minute.  Cardiac index 2.2 L per minute per meter squared.  Pulmonary vascular resistance was 1.5 Woods units.  [place] 10/19 RA = 11 RV = 27/5 PA = 24/3 (17) PCW = 9 Fick cardiac output/index = 4.8/2.5 PVR = 1.8 WU Ao sat = 98% PA sat = 66%, 67%  Follow up: Here for routine f/u. Says she doesn't feel as well. More bloated and fatigued. Staying active going to grandkids sports games. Echo in 9/19 EF 40-45%. Can do most activities just has to take it slow. Taking metolazone 2.5 every Friday and as needed for bloating about 2x month. Also has orthopnea. LE edema at night butter in am. Weight up 9 pounds here. On scale at home has been up and down. Walking a little bit.   ICD interrogation done personally in clinic: No VT/AF 100% biv pacing. Activity level 4 hours. Optivol levels increasing steadily - now way up.   Echo 05/10/18 EF 40-45% (I reviewed and felt 45%) Moderate TR with some septal flattening. RV ok  Personally reviewed  Echo 11/17: EF 50-55%   Echo 5/11: EF 50-55%  12/15/11 ECHO EF 50%. RV normal. 12/16/11 ICD generator replaced due to EOL 04/2013 EF 50% 07/19/14 Echo EF 50% inferior HK GLS -17.8 6/16 Echo EF 45-50   ROS: All systems negative except as listed in HPI, PMH and Problem List.  Past Medical History:  Diagnosis Date  . AICD (automatic  cardioverter/defibrillator) present   . Biventricular implantable cardiac defibrillator)    Medtronic  . Breast cancer ([place])    s/p mastectomy and chemo with adriamycin (1998)  . CHF (congestive heart failure) (HCC)    takes Lasix and Aldactone daily  . GERD (gastroesophageal reflux disease)    takes Omeprazole daily  . Gout    takes Allopurinol daily  . History of bronchitis 2 yrs ago  . History of shingles   . Hyperlipidemia    takes Simvastatin daily  . Insomnia    takes Ambien nightly  . Nonischemic cardiomyopathy ([place])    EF 55% echo 2012  . NSVT (nonsustained ventricular tachycardia) ([place])   . Obesity   . Pneumonia 18yrs ago  . Presence of permanent cardiac pacemaker   . Shortness of breath dyspnea    with exertion  . sprint Fidelis     Current Outpatient Medications  Medication Sig Dispense Refill  . alendronate (FOSAMAX) 70 MG tablet Take 70 mg by mouth once a week.     Marland Kitchen allopurinol (ZYLOPRIM) 100 MG tablet Take 100 mg by mouth daily.     . carvedilol (COREG) 12.5 MG tablet TAKE 1 AND 1/2 TABLETS TWICE DAILY WITH A MEAL 270 tablet 3  . Cholecalciferol (VITAMIN D3) 5000 units TABS Take 5,000 Units by mouth daily.     . citalopram (CELEXA) 20 MG tablet Take  20 mg by mouth daily.     Marland Kitchen losartan (COZAAR) 50 MG tablet TAKE 1 TABLET EVERY DAY 90 tablet 1  . metolazone (ZAROXOLYN) 2.5 MG tablet TAKE 1 TABLET EVERY WEEK 13 tablet 0  . multivitamin-iron-minerals-folic acid (THERAPEUTIC-M) TABS tablet Take 1 tablet by mouth daily.    Marland Kitchen omeprazole (PRILOSEC) 20 MG capsule Take 20 mg by mouth daily at 12 noon.     . potassium chloride SA (K-DUR) 20 MEQ tablet Take 40 mEq by mouth 2 (two) times daily.    . simvastatin (ZOCOR) 40 MG tablet TAKE 1 TABLET EVERY EVENING 90 tablet 1  . spironolactone (ALDACTONE) 25 MG tablet TAKE 1 TABLET DAILY  (MUST CALL AND MAKE APPOINTMENT IN ORDER TO CONTINUE GETTING REFILLS) 90 tablet 3  . torsemide (DEMADEX) 20 MG tablet TAKE 3 TABLETS TWICE  DAILY 540 tablet 2  . venlafaxine XR (EFFEXOR-XR) 150 MG 24 hr capsule Take 150 mg by mouth daily with breakfast.    . vitamin B-12 (CYANOCOBALAMIN) 100 MCG tablet Take 150 mcg by mouth daily.     Marland Kitchen zolpidem (AMBIEN) 5 MG tablet Take 5 mg by mouth at bedtime as needed. For sleep     No current facility-administered medications for this encounter.    PHYSICAL EXAM: Vitals:   05/10/19 1106  BP: (!) 102/37  Pulse: 91  SpO2: 98%  Weight: 98 kg (216 lb)   Wt Readings from Last 3 Encounters:  05/10/19 98 kg (216 lb)  01/06/19 93.9 kg (207 lb)  06/20/18 93.9 kg (207 lb)   General: Fatigued appearing. No resp difficulty HEENT: normal Neck: supple. JVP 8-9. Carotids 2+ bilat; no bruits. No lymphadenopathy or thryomegaly appreciated. Cor: PMI nondisplaced. Regular rate & rhythm. No rubs, gallops or murmurs. Lungs: clear Abdomen: obese soft, nontender, nondistended. No hepatosplenomegaly. No bruits or masses. Good bowel sounds. Extremities: no cyanosis, clubbing, rash, tr edema Neuro: alert & orientedx3, cranial nerves grossly intact. moves all 4 extremities w/o difficulty. 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 EF 50-55% on echo 11/17 - Echo 9/19 EF 45% with septal dyssynergy (despite biv pacing). EF looks normal when septum not in view. - Feels worse today NYHA II - RHC in 10/19 with relatively normal hemodynamics except for RA 10 (PCWP was 9)  - Continues to struggle with volume status going up and down. Optivol is very high but ReDs vest only 28% - Increase metolazone to Mon/Fri. Watch renal function.  - If not improving may need to consider Cardiomems.  - Suspect she has severe OSA. Will check sleep study - Reinforced need for daily weights and reviewed use of sliding scale diuretics. - Check labs today   2) Medtronic BiV ICD:  - ICD interrogated personally in clinic. No VT/AF. 100% biv pacing. Optivol up but  sensor doesn't seem to be working well - Followed by EP.   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 No recent gout flares.   4) HTN - BP remains soft but asymptomatic. No orthostasis   5) Fatigue/snoring - Suspect she has severe OSA. Will check sleep study  Total time spent 40 minutes. Over half that time spent discussing above.   Glori Bickers MD  05/10/2019 12:03 PM

## 2019-05-16 ENCOUNTER — Other Ambulatory Visit (HOSPITAL_COMMUNITY): Payer: Self-pay

## 2019-05-16 DIAGNOSIS — I5022 Chronic systolic (congestive) heart failure: Secondary | ICD-10-CM

## 2019-05-16 NOTE — Progress Notes (Addendum)
Order, OV note, stop bang and demographics all faxed to [facility] at [phone] via epic  

## 2019-05-17 ENCOUNTER — Encounter (HOSPITAL_COMMUNITY): Payer: Self-pay

## 2019-05-29 ENCOUNTER — Other Ambulatory Visit: Payer: Self-pay | Admitting: Internal Medicine

## 2019-05-30 LAB — BASIC METABOLIC PANEL
BUN/Creatinine Ratio: 28 (ref 12–28)
BUN: 27 mg/dL (ref 8–27)
CO2: 27 mmol/L (ref 20–29)
Calcium: 10.3 mg/dL (ref 8.7–10.3)
Chloride: 95 mmol/L — ABNORMAL LOW (ref 96–106)
Creatinine, Ser: 0.98 mg/dL (ref 0.57–1.00)
GFR calc Af Amer: 70 mL/min/{1.73_m2} (ref >59)
GFR calc non Af Amer: 61 mL/min/{1.73_m2} (ref >59)
Glucose: 115 mg/dL — ABNORMAL HIGH (ref 65–99)
Potassium: 4.7 mmol/L (ref 3.5–5.2)
Sodium: 136 mmol/L (ref 134–144)

## 2019-05-31 ENCOUNTER — Other Ambulatory Visit (HOSPITAL_COMMUNITY): Payer: Self-pay | Admitting: Internal Medicine

## 2019-05-31 ENCOUNTER — Other Ambulatory Visit (HOSPITAL_COMMUNITY): Payer: Self-pay | Admitting: Cardiology

## 2019-06-01 ENCOUNTER — Other Ambulatory Visit (HOSPITAL_COMMUNITY): Payer: Self-pay | Admitting: Cardiology

## 2019-06-03 ENCOUNTER — Encounter (HOSPITAL_COMMUNITY): Payer: Self-pay

## 2019-06-05 ENCOUNTER — Other Ambulatory Visit: Payer: Self-pay | Admitting: Internal Medicine

## 2019-06-06 ENCOUNTER — Encounter (INDEPENDENT_AMBULATORY_CARE_PROVIDER_SITE_OTHER): Payer: Medicare PPO | Admitting: Cardiology

## 2019-06-06 DIAGNOSIS — G4733 Obstructive sleep apnea (adult) (pediatric): Secondary | ICD-10-CM

## 2019-06-19 ENCOUNTER — Ambulatory Visit: Payer: Medicare PPO

## 2019-06-19 ENCOUNTER — Telehealth: Payer: Self-pay | Admitting: *Deleted

## 2019-06-19 ENCOUNTER — Other Ambulatory Visit: Payer: Self-pay

## 2019-06-19 DIAGNOSIS — I5022 Chronic systolic (congestive) heart failure: Secondary | ICD-10-CM

## 2019-06-19 NOTE — Telephone Encounter (Signed)
Informed patient of sleep study results and patient understanding was verbalized. Patient understands her sleep study showed they have sleep apnea and recommend auto CPAP titration through [facility]. Orders have been placed in Epic. Please set 10 week OV with me.   Upon patient request DME selection is [facility]. Patient understands she will be contacted by [facility] to set up her cpap. Patient understands to call if [facility] does not contact her with new setup in a timely manner. Patient understands they will be called once confirmation has been received from [facility] that they have received their new machine to schedule 10 week follow up appointment.  [facility] notified of new cpap order sent by Electronic fax  Please add to [facility] Patient was grateful for the call and thanked me.

## 2019-06-19 NOTE — Telephone Encounter (Signed)
Informed patient of sleep study results and patient understanding was verbalized. Patient understands  Her sleep study showed they have sleep apnea and recommend auto CPAP titration through [facility]. Orders have been placed in Epic. Please set 10 week OV with me.   DME selection is [facility]. Patient understands he will be contacted by [facility] to set up his cpap. Patient understands to call if [facility] does not contact him with new setup in a timely manner. Patient understands they will be called once confirmation has been received from [facility] that they have received their new machine to schedule 10 week follow up appointment.  BN notified of new cpap order via electronic fax Please add to [facility] Patient was grateful for the call and thanked me.

## 2019-06-19 NOTE — Telephone Encounter (Signed)
-----   Message from Sueanne Margarita, MD sent at 06/19/2019 12:56 PM EDT ----- Please let patient know that they have sleep apnea and recommend auto CPAP titration through [facility].  Orders have been placed in Epic. Please set 10 week OV with me.

## 2019-06-19 NOTE — Procedures (Signed)
   Patient Information Name: [name]  ID: [id number] Birth Date: 1956-06-29  Age: 63  Gender: Female Study Date:06/06/2019 Referring Physician:  Pierre Bali, MD  Summary & Diagnosis  TEST DESCRIPTION: Home sleep apnea testing was completed using the WatchPat, a Type 1 device, utilizing peripheral arterial tonometry (PAT), chest movement, actigraphy, pulse oximetry, pulse rate, body position and snore. AHI was calculated with apnea and hypopnea using valid sleep time as the denominator. RDI includes apneas, hypopneas, and RERAs. The data acquired and the scoring of sleep and all associated events were performed in accordance with the recommended standards and specifications as outlined in the AASM Manual for the Scoring of Sleep and Associated Events 2.2.0 (2015).  Findings: 1. Moderate Obstructive Sleep Apnea (G47.33) with AHI 18.6/hr. 2. No significant Central Sleep Apnea wsa present. 3. Oxygen desaturations as low as 81% were noted with time spent with O2 sats < 88% was 11.8 min. 4. Average heart rate was 71bpm. 5. Moderate snoring was noted. 6. Delayed sleep onset latency ate 51 min. 7. Shortened REM sleep onset latency at 49 min.  Diagnosis Moderate Obstructive Sleep Apnea Nocturnal Hypoxemia  Recommendations  1. Findings are consistent with moderate OSA. For symptomatic moderate obstructive sleep apnea, patient preference and compliance impacts efficacious outcomes. Therapeutic options include:   a. The patient may benefit from the use of a nocturnal mandibular repositioning appliance. If that line of therapy is to be pursued the patient should be evaluated by a dentist trained in the treatment of sleep related breathing disorders.   b. An ENT consultation which may be useful for specific causes of obstruction and possible treatment options .   c. Consider treatment with nasal continuous positive airway pressure ( CPAP). If the patient chooses CPAP therapy,  a nocturnal PSG with CPAP titration is recommended. As an alternative, an Auto PAP with pressure range 5-20cm H2O with download is an option.  2. Weight loss may be of benefit in reducing the severity of respiratory events and snoring .  3. Routine follow-up efficacy testing should be performed.  Report prepared by: Signature: Fransico Him Electronically Signed: Jun 19, 2019

## 2019-06-21 ENCOUNTER — Encounter (HOSPITAL_COMMUNITY): Payer: Self-pay

## 2019-07-04 ENCOUNTER — Ambulatory Visit (INDEPENDENT_AMBULATORY_CARE_PROVIDER_SITE_OTHER): Payer: Medicare PPO | Admitting: *Deleted

## 2019-07-04 DIAGNOSIS — I5022 Chronic systolic (congestive) heart failure: Secondary | ICD-10-CM | POA: Diagnosis not present

## 2019-07-04 DIAGNOSIS — I4729 Other ventricular tachycardia: Secondary | ICD-10-CM

## 2019-07-04 DIAGNOSIS — I472 Ventricular tachycardia: Secondary | ICD-10-CM

## 2019-07-04 LAB — CUP PACEART REMOTE DEVICE CHECK
Battery Remaining Longevity: 28 mo
Battery Voltage: 2.94 V
Brady Statistic AP VP Percent: 13.37 %
Brady Statistic AP VS Percent: 0.01 %
Brady Statistic AS VP Percent: 86.6 %
Brady Statistic AS VS Percent: 0.03 %
Brady Statistic RA Percent Paced: 13.32 %
Brady Statistic RV Percent Paced: 99.56 %
Date Time Interrogation Session: 20201027052404
HighPow Impedance: 74 Ohm
Implantable Lead Implant Date: 20070821
Implantable Lead Implant Date: 20070821
Implantable Lead Implant Date: 20160211
Implantable Lead Location: 753858
Implantable Lead Location: 753859
Implantable Lead Location: 753860
Implantable Lead Model: 4194
Implantable Lead Model: 5076
Implantable Lead Model: 6935
Implantable Pulse Generator Implant Date: 20160211
Lead Channel Impedance Value: 171 Ohm
Lead Channel Impedance Value: 380 Ohm
Lead Channel Impedance Value: 456 Ohm
Lead Channel Impedance Value: 532 Ohm
Lead Channel Impedance Value: 627 Ohm
Lead Channel Impedance Value: 646 Ohm
Lead Channel Pacing Threshold Amplitude: 0.375 V
Lead Channel Pacing Threshold Amplitude: 1.125 V
Lead Channel Pacing Threshold Amplitude: 1.625 V
Lead Channel Pacing Threshold Pulse Width: 0.4 ms
Lead Channel Pacing Threshold Pulse Width: 0.4 ms
Lead Channel Pacing Threshold Pulse Width: 0.4 ms
Lead Channel Sensing Intrinsic Amplitude: 0.75 mV
Lead Channel Sensing Intrinsic Amplitude: 0.75 mV
Lead Channel Sensing Intrinsic Amplitude: 20 mV
Lead Channel Sensing Intrinsic Amplitude: 20 mV
Lead Channel Setting Pacing Amplitude: 1.5 V
Lead Channel Setting Pacing Amplitude: 1.75 V
Lead Channel Setting Pacing Amplitude: 2.5 V
Lead Channel Setting Pacing Pulse Width: 0.4 ms
Lead Channel Setting Pacing Pulse Width: 0.4 ms
Lead Channel Setting Sensing Sensitivity: 0.3 mV

## 2019-07-10 ENCOUNTER — Ambulatory Visit (HOSPITAL_COMMUNITY)
Admission: RE | Admit: 2019-07-10 | Discharge: 2019-07-10 | Disposition: A | Discharge status: Home / Self Care | Payer: Medicare PPO | Source: Ambulatory Visit | Attending: Internal Medicine | Admitting: Internal Medicine

## 2019-07-10 ENCOUNTER — Other Ambulatory Visit: Payer: Self-pay

## 2019-07-10 ENCOUNTER — Encounter (HOSPITAL_COMMUNITY): Payer: Self-pay | Admitting: Internal Medicine

## 2019-07-10 VITALS — BP 118/82 | HR 76 | Wt 212.8 lb

## 2019-07-10 DIAGNOSIS — Z950 Presence of cardiac pacemaker: Secondary | ICD-10-CM | POA: Insufficient documentation

## 2019-07-10 DIAGNOSIS — I5032 Chronic diastolic (congestive) heart failure: Secondary | ICD-10-CM | POA: Insufficient documentation

## 2019-07-10 DIAGNOSIS — K219 Gastro-esophageal reflux disease without esophagitis: Secondary | ICD-10-CM | POA: Diagnosis not present

## 2019-07-10 DIAGNOSIS — Z79899 Other long term (current) drug therapy: Secondary | ICD-10-CM | POA: Diagnosis not present

## 2019-07-10 DIAGNOSIS — G4700 Insomnia, unspecified: Secondary | ICD-10-CM | POA: Diagnosis not present

## 2019-07-10 DIAGNOSIS — Z95810 Presence of automatic (implantable) cardiac defibrillator: Secondary | ICD-10-CM

## 2019-07-10 DIAGNOSIS — I110 Hypertensive heart disease with heart failure: Secondary | ICD-10-CM | POA: Diagnosis not present

## 2019-07-10 DIAGNOSIS — Z853 Personal history of malignant neoplasm of breast: Secondary | ICD-10-CM | POA: Insufficient documentation

## 2019-07-10 DIAGNOSIS — G4733 Obstructive sleep apnea (adult) (pediatric): Secondary | ICD-10-CM | POA: Insufficient documentation

## 2019-07-10 DIAGNOSIS — I5022 Chronic systolic (congestive) heart failure: Secondary | ICD-10-CM

## 2019-07-10 DIAGNOSIS — R5383 Other fatigue: Secondary | ICD-10-CM | POA: Diagnosis not present

## 2019-07-10 DIAGNOSIS — E785 Hyperlipidemia, unspecified: Secondary | ICD-10-CM | POA: Insufficient documentation

## 2019-07-10 DIAGNOSIS — E669 Obesity, unspecified: Secondary | ICD-10-CM | POA: Insufficient documentation

## 2019-07-10 LAB — BASIC METABOLIC PANEL
Anion gap: 10 (ref 5–15)
BUN: 21 mg/dL (ref 8–23)
CO2: 25 mmol/L (ref 22–32)
Calcium: 9.6 mg/dL (ref 8.9–10.3)
Chloride: 102 mmol/L (ref 98–111)
Creatinine, Ser: 1.08 mg/dL — ABNORMAL HIGH (ref 0.44–1.00)
GFR calc Af Amer: >60 mL/min (ref >60)
GFR calc non Af Amer: 54 mL/min — ABNORMAL LOW (ref >60)
Glucose, Bld: 113 mg/dL — ABNORMAL HIGH (ref 70–99)
Potassium: 4.2 mmol/L (ref 3.5–5.1)
Sodium: 137 mmol/L (ref 135–145)

## 2019-07-10 LAB — BRAIN NATRIURETIC PEPTIDE: B Natriuretic Peptide: 338.8 pg/mL — ABNORMAL HIGH (ref 0.0–100.0)

## 2019-07-10 NOTE — Progress Notes (Signed)
Patient ID: [name], female   DOB: [birth year], 63 y.o.   MRN: [id number]   ADVANCED HE CLINIC NOTE  PCP: Dr Raeanne Gathers  HPI: [name] is a 63 y/o woman with history of breast CA in 1998 treated with Adriamycin x 4 cycles. In 2007, diagnosed with CHF with EF 25-35%. She is s/p Medtronic BiV-ICD in 2008 with Sprint Fidelis lead which was subsequently replaced. LV function has subsequently recovered  Had cath 12/10. Coronaries normal. Right atrial pressure mean of 7, RV pressure 28/6 . Pulmonary artery pressure was 23/9 with mean of 15.  Pulmonary capillary wedge pressure mean of 9. Fick cardiac output 3.9 L per minute.  Cardiac index 2.2 L per minute per meter squared.  Pulmonary vascular resistance was 1.5 Woods units.  [place] 10/19 RA = 11 RV = 27/5 PA = 24/3 (17) PCW = 9 Fick cardiac output/index = 4.8/2.5 PVR = 1.8 WU Ao sat = 98% PA sat = 66%, 67%  Follow up: Here for routine f/u. Remains fatigued. No real change.  Recent sleep study with moderate OSA AHI 18.6/hr with desats to 81%. Can do ADLs without too much trouble but gets SOB if she tries to do more or hurry. Recently we increased metolazone to 2x/week. Now taking Monday and Friday. Hasn't noticed much of a difference. + orthopnea. No edema or CP.   ICD interrogation done personally in clinic: No VT/AF 100% BiV pacing fluid has been well controlled since early September but now on the rise. Personally reviewed   Echo 05/10/18 EF 40-45% (I reviewed and felt 45%) Moderate TR with some septal flattening. RV ok  Personally reviewed  Echo 11/17: EF 50-55%   Echo 5/11: EF 50-55%  12/15/11 ECHO EF 50%. RV normal. 12/16/11 ICD generator replaced due to EOL 04/2013 EF 50% 07/19/14 Echo EF 50% inferior HK GLS -17.8 6/16 Echo EF 45-50   ROS: All systems negative except as listed in HPI, PMH and Problem List.  Past Medical History:  Diagnosis Date  . AICD (automatic cardioverter/defibrillator) present   . Biventricular  implantable cardiac defibrillator)    Medtronic  . Breast cancer ([place])    s/p mastectomy and chemo with adriamycin (1998)  . CHF (congestive heart failure) (HCC)    takes Lasix and Aldactone daily  . GERD (gastroesophageal reflux disease)    takes Omeprazole daily  . Gout    takes Allopurinol daily  . History of bronchitis 2 yrs ago  . History of shingles   . Hyperlipidemia    takes Simvastatin daily  . Insomnia    takes Ambien nightly  . Nonischemic cardiomyopathy ([place])    EF 55% echo 2012  . NSVT (nonsustained ventricular tachycardia) ([place])   . Obesity   . Pneumonia 56yrs ago  . Presence of permanent cardiac pacemaker   . Shortness of breath dyspnea    with exertion  . sprint Fidelis     Current Outpatient Medications  Medication Sig Dispense Refill  . alendronate (FOSAMAX) 70 MG tablet Take 70 mg by mouth once a week.     Marland Kitchen allopurinol (ZYLOPRIM) 100 MG tablet Take 100 mg by mouth daily.     . carvedilol (COREG) 12.5 MG tablet TAKE 1 AND 1/2 TABLETS TWICE DAILY WITH A MEAL 270 tablet 3  . Cholecalciferol (VITAMIN D3) 5000 units TABS Take 5,000 Units by mouth daily.     . citalopram (CELEXA) 20 MG tablet Take 20 mg by mouth daily.     Marland Kitchen losartan (  COZAAR) 50 MG tablet TAKE 1 TABLET EVERY DAY 90 tablet 1  . metolazone (ZAROXOLYN) 2.5 MG tablet Take 1 tablet (2.5 mg total) by mouth 2 (two) times a week. Monday and Friday 13 tablet 6  . multivitamin-iron-minerals-folic acid (THERAPEUTIC-M) TABS tablet Take 1 tablet by mouth daily.    Marland Kitchen omeprazole (PRILOSEC) 20 MG capsule Take 20 mg by mouth daily at 12 noon.     . potassium chloride SA (K-DUR) 20 MEQ tablet Take 40 mEq by mouth 2 (two) times daily.    . simvastatin (ZOCOR) 40 MG tablet TAKE 1 TABLET EVERY EVENING 90 tablet 1  . spironolactone (ALDACTONE) 25 MG tablet TAKE 1 TABLET DAILY  (MUST CALL AND MAKE APPOINTMENT IN ORDER TO CONTINUE GETTING REFILLS) 90 tablet 3  . torsemide (DEMADEX) 20 MG tablet TAKE 3 TABLETS TWICE  DAILY 540 tablet 2  . venlafaxine XR (EFFEXOR-XR) 150 MG 24 hr capsule Take 150 mg by mouth daily with breakfast.    . vitamin B-12 (CYANOCOBALAMIN) 100 MCG tablet Take 150 mcg by mouth daily.     Marland Kitchen zolpidem (AMBIEN) 5 MG tablet Take 5 mg by mouth at bedtime as needed. For sleep     No current facility-administered medications for this encounter.    PHYSICAL EXAM: Vitals:   07/10/19 0919  BP: 118/82  Pulse: 76  SpO2: 100%  Weight: 96.5 kg (212 lb 12.8 oz)   Wt Readings from Last 3 Encounters:  07/10/19 96.5 kg (212 lb 12.8 oz)  05/10/19 98 kg (216 lb)  01/06/19 93.9 kg (207 lb)   General:  Well appearing. No resp difficulty HEENT: normal Neck: supple. no JVD. Carotids 2+ bilat; no bruits. No lymphadenopathy or thryomegaly appreciated. Cor: PMI nondisplaced. Regular rate & rhythm. No rubs, gallops or murmurs. Lungs: clear Abdomen: obese soft, nontender, nondistended. No hepatosplenomegaly. No bruits or masses. Good bowel sounds. Extremities: no cyanosis, clubbing, rash, edema Neuro: alert & orientedx3, cranial nerves grossly intact. moves all 4 extremities w/o difficulty. 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 EF 50-55% on echo 11/17 - Echo 9/19 EF 45% with septal dyssynergy (despite biv pacing). EF looks normal when septum not in view. - Remains NYHA III but fatigue predominates and I wonder if OSA playing more of a role than we think - RHC in 10/19 with relatively normal hemodynamics except for RA 10 (PCWP was 9)  - Continues to struggle with volume status going up and down. At last visit metolazone increased to 2x/week and volume status has been much more stable but now back on the rise. Will have her take extra metolazone this week.  - Continue metolazone to Mon/Fri. Check labs today - If not improving may need to consider Cardiomems but I would like to see response to CPAP first - Reinforced need for daily  weights and reviewed use of sliding scale diuretics. - Check labs today   2) Medtronic BiV ICD:  -ICD interrogation done personally in clinic: No VT/AF 100% BiV pacing fluid has been well controlled since early September but now on the rise. 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 No recent gout flares.   4) HTN - Blood pressure well controlled. Continue current regimen.  5) Fatigue/snoring - Sleep study with moderate OSA. Encouraged her to begin treatment   Glori Bickers MD  07/10/2019 9:47 AM

## 2019-07-10 NOTE — Patient Instructions (Addendum)
Take Metolazone today and tomorrow  Your physician recommends that you schedule a follow-up appointment in: 3-4 months.  If you have any questions or concerns before your next appointment please send [place] a message through [place] or call our office at [phone].

## 2019-07-19 ENCOUNTER — Telehealth: Payer: Self-pay | Admitting: *Deleted

## 2019-07-19 NOTE — Telephone Encounter (Signed)
Patient has a 10 week follow up appointment scheduled for 08/24/19 8:00 am. Patient understands she needs to keep this appointment for insurance compliance. Patient was grateful for the call and thanked me.

## 2019-07-25 NOTE — Progress Notes (Signed)
Remote ICD transmission.   

## 2019-08-23 NOTE — Progress Notes (Signed)
Virtual Visit via Video Note   This visit type was conducted due to national recommendations for restrictions regarding the COVID-19 Pandemic (e.g. social distancing) in an effort to limit this patient's exposure and mitigate transmission in our community.  Due to her co-morbid illnesses, this patient is at least at moderate risk for complications without adequate follow up.  This format is felt to be most appropriate for this patient at this time.  All issues noted in this document were discussed and addressed.  A limited physical exam was performed with this format.  Please refer to the patient's chart for her consent to telehealth for [facility].  Evaluation Performed:  Follow-up visit  This visit type was conducted due to national recommendations for restrictions regarding the COVID-19 Pandemic (e.g. social distancing).  This format is felt to be most appropriate for this patient at this time.  All issues noted in this document were discussed and addressed.  No physical exam was performed (except for noted visual exam findings with Video Visits).  Please refer to the patient's chart (MyChart message for video visits and phone note for telephone visits) for the patient's consent to telehealth for [facility].  Date:  08/24/2019   ID:  [name], DOB 03/10/1956, MRN [id number]  Patient Location:  Home  Provider location:   [place]  PCP:  Sueanne Margarita, MD  Cardiologist:  Glori Bickers, MD Sleep Medicine:  Fransico Him, MD Electrophysiologist:  None   Chief Complaint:  OSA  History of Present Illness:    [name] is a 63 y.o. female who presents via audio/video conferencing for a telehealth visit today.    This is a 63yo female with a hx of Breast CA in 1998 tx with Adriamycin x 4 cycles and then dx CHF with 2007 with EF 25-35% s/p BiV-ICD.  She complained of non restorative sleep as well as daytime sleepiness at one of her OV and a sleep study  was ordered.  She underwent Home sleep study which showed moderate OSA with an [facility] of 18.6/hr with no significant central sleep apnea and O2 sats as low as 81%.  She was placed on auto CPAP.    She has had some problems with her device.  She has a nasal pillow mask and has had bad problems with pulling her mask off in her sleep.  She does not know that she is pulling it off.   She tolerates the pressure.  She has not been using it because of the mouth issue.  She supposed to get a full face mask in the mail today.   The patient does not have symptoms concerning for COVID-19 infection (fever, chills, cough, or new shortness of breath).    Prior CV studies:   The following studies were reviewed today:  PAP compliance download  Past Medical History:  Diagnosis Date  . AICD (automatic cardioverter/defibrillator) present   . Biventricular implantable cardiac defibrillator)    Medtronic  . Breast cancer ([place])    s/p mastectomy and chemo with adriamycin (1998)  . CHF (congestive heart failure) (HCC)    takes Lasix and Aldactone daily  . GERD (gastroesophageal reflux disease)    takes Omeprazole daily  . Gout    takes Allopurinol daily  . History of bronchitis 2 yrs ago  . History of shingles   . Hyperlipidemia    takes Simvastatin daily  . Insomnia    takes Ambien nightly  . Nonischemic cardiomyopathy ([place])  EF 55% echo 2012  . NSVT (nonsustained ventricular tachycardia) ([place])   . Obesity   . Pneumonia 41yrs ago  . Presence of permanent cardiac pacemaker   . Shortness of breath dyspnea    with exertion  . sprint Fidelis    Past Surgical History:  Procedure Laterality Date  . ABDOMINAL HYSTERECTOMY    . APPENDECTOMY  1978  . BiV ICD  2007   Medtronic  . BIV ICD GENERTAOR CHANGE OUT N/A 12/16/2011   Procedure: BIV ICD GENERTAOR CHANGE OUT;  Surgeon: [name], MD;  Location: [facility] CATH LAB;  Service: Cardiovascular;  Laterality: N/A;  . COLONOSCOPY    . cyst removed from  wrist Left    early 70's  . ICD LEAD REMOVAL Right 10/18/2014   Procedure: ICD LEAD REMOVAL/EXTRACTION/REIMPLANTATION;  Surgeon: Evans Lance, MD;  Location: [place];  Service: Cardiovascular;  Laterality: Right;  . MASTECTOMY Left 1998  . port a cath placed  1998  . port removed  1998  . RIGHT HEART CATH N/A 06/20/2018   Procedure: RIGHT HEART CATH;  Surgeon: Jolaine Artist, MD;  Location: [place] CV LAB;  Service: Cardiovascular;  Laterality: N/A;     Current Meds  Medication Sig  . alendronate (FOSAMAX) 70 MG tablet Take 70 mg by mouth once a week.   Marland Kitchen allopurinol (ZYLOPRIM) 100 MG tablet Take 100 mg by mouth daily.   . carvedilol (COREG) 12.5 MG tablet TAKE 1 AND 1/2 TABLETS TWICE DAILY WITH A MEAL  . Cholecalciferol (VITAMIN D3) 5000 units TABS Take 5,000 Units by mouth daily.   . citalopram (CELEXA) 20 MG tablet Take 20 mg by mouth daily.   Marland Kitchen losartan (COZAAR) 50 MG tablet TAKE 1 TABLET EVERY DAY  . metolazone (ZAROXOLYN) 2.5 MG tablet Take 1 tablet (2.5 mg total) by mouth 2 (two) times a week. Monday and Friday (Patient taking differently: Take 2.5 mg by mouth 2 (two) times a week. Monday ,Tuesday and Friday)  . multivitamin-iron-minerals-folic acid (THERAPEUTIC-M) TABS tablet Take 1 tablet by mouth daily.  Marland Kitchen omeprazole (PRILOSEC) 20 MG capsule Take 20 mg by mouth daily at 12 noon.   . potassium chloride SA (K-DUR) 20 MEQ tablet Take 40 mEq by mouth 2 (two) times daily.  . simvastatin (ZOCOR) 40 MG tablet TAKE 1 TABLET EVERY EVENING  . spironolactone (ALDACTONE) 25 MG tablet TAKE 1 TABLET DAILY  (MUST CALL AND MAKE APPOINTMENT IN ORDER TO CONTINUE GETTING REFILLS)  . torsemide (DEMADEX) 20 MG tablet TAKE 3 TABLETS TWICE DAILY  . venlafaxine XR (EFFEXOR-XR) 150 MG 24 hr capsule Take 150 mg by mouth daily with breakfast.  . vitamin B-12 (CYANOCOBALAMIN) 100 MCG tablet Take 150 mcg by mouth daily.   Marland Kitchen zolpidem (AMBIEN) 5 MG tablet Take 5 mg by mouth at bedtime as needed. For  sleep     Allergies:   Buprenorphine hcl, Morphine and related, Percocet [oxycodone-acetaminophen], and Oxycodone   Social History   Tobacco Use  . Smoking status: Never Smoker  . Smokeless tobacco: Never Used  Substance Use Topics  . Alcohol use: No  . Drug use: No     Family Hx: The patient's family history includes Alzheimer's disease in her father; Diabetes in her mother; Heart disease in her father.  ROS:   Please see the history of present illness.     All other systems reviewed and are negative.   Labs/Other Tests and Data Reviewed:    Recent Labs: 07/10/2019: B  Natriuretic Peptide 338.8; BUN 21; Creatinine, Ser 1.08; Potassium 4.2; Sodium 137   Recent Lipid Panel Lab Results  Component Value Date/Time   CHOL 133 08/01/2014 10:57 AM   TRIG 94 08/01/2014 10:57 AM   HDL 43 08/01/2014 10:57 AM   CHOLHDL 3.1 08/01/2014 10:57 AM   LDLCALC 71 08/01/2014 10:57 AM   LDLDIRECT 147.2 01/15/2009 09:41 AM    Wt Readings from Last 3 Encounters:  08/24/19 198 lb (89.8 kg)  07/10/19 212 lb 12.8 oz (96.5 kg)  05/10/19 216 lb (98 kg)     Objective:    Vital Signs:  BP 102/74   Pulse 91   Ht 5\' 1"  (1.549 m)   Wt 198 lb (89.8 kg)   SpO2 96%   BMI 37.41 kg/m    CONSTITUTIONAL:  Well nourished, well developed female in no acute distress.  EYES: anicteric MOUTH: oral mucosa is pink RESPIRATORY: Normal respiratory effort, symmetric expansion CARDIOVASCULAR: No peripheral edema SKIN: No rash, lesions or ulcers MUSCULOSKELETAL: no digital cyanosis NEURO: Cranial Nerves II-XII grossly intact, moves all extremities PSYCH: Intact judgement and insight.  A&O x 3, Mood/affect appropriate   ASSESSMENT & PLAN:    1.  OSA - The pathophysiology of obstructive sleep apnea , it's cardiovascular consequences & modes of treatment including CPAP were discused with the patient in detail & they evidenced understanding.  The patient is tolerating PAP therapy well without any  problems. The PAP download was reviewed today and showed an AHI of 0.9/hr on auto PAP with 0% compliance in using more than 4 hours nightly.  The patient has had problems with keeping her nasal pillow mask on her face and this is why she has not been compliant.  She has a full face mask that is in the mail.   2.  HTN -BP controlled -continue on Carvedilol 12.5mg  BID, Losartan 50mg  daily, spiro 25mg  daily  3.  Obesity -I have encouraged her to get into a routine exercise program and cut back on carbs and portions.    OVID-19 Education: The signs and symptoms of COVID-19 were discussed with the patient and how to seek care for testing (follow up with PCP or arrange E-visit).  The importance of social distancing was discussed today.  Patient Risk:   After full review of this patient's clinical status, I feel that they are at least moderate risk at this time.  Time:   Today, I have spent 20 minutes directly with the patient on telemedicine discussing medical problems including OSA, HTN, obesity.  We also reviewed the symptoms of COVID 19 and the ways to protect against contracting the virus with telehealth technology.  I spent an additional 5 minutes reviewing patient's chart including PAP compliance download.  Medication Adjustments/Labs and Tests Ordered: Current medicines are reviewed at length with the patient today.  Concerns regarding medicines are outlined above.  Tests Ordered: No orders of the defined types were placed in this encounter.  Medication Changes: No orders of the defined types were placed in this encounter.   Disposition:  Follow up with me virtual in 4 weeks.  Signed, Fransico Him, MD  08/24/2019 8:12 AM    [place]

## 2019-08-24 ENCOUNTER — Telehealth (INDEPENDENT_AMBULATORY_CARE_PROVIDER_SITE_OTHER): Payer: Medicare PPO | Admitting: Cardiology

## 2019-08-24 ENCOUNTER — Other Ambulatory Visit: Payer: Self-pay

## 2019-08-24 VITALS — BP 102/74 | HR 91 | Ht 61.0 in | Wt 198.0 lb

## 2019-08-24 DIAGNOSIS — E669 Obesity, unspecified: Secondary | ICD-10-CM

## 2019-08-24 DIAGNOSIS — I10 Essential (primary) hypertension: Secondary | ICD-10-CM | POA: Diagnosis not present

## 2019-08-24 DIAGNOSIS — G4733 Obstructive sleep apnea (adult) (pediatric): Secondary | ICD-10-CM

## 2019-08-24 NOTE — Patient Instructions (Addendum)
Medication Instructions:  Your physician recommends that you continue on your current medications as directed. Please refer to the Current Medication list given to you today.  *If you need a refill on your cardiac medications before your next appointment, please call your pharmacy*  Lab Work: none If you have labs (blood work) drawn today and your tests are completely normal, you will receive your results only by: Marland Kitchen MyChart Message (if you have MyChart) OR . A paper copy in the mail If you have any lab test that is abnormal or we need to change your treatment, we will call you to review the results.  Testing/Procedures: none  Follow-Up: At [facility], you and your health needs are our priority.  As part of our continuing mission to provide you with exceptional heart care, we have created designated Provider Care Teams.  These Care Teams include your primary Cardiologist (physician) and Advanced Practice Providers (APPs -  Physician Assistants and Nurse Practitioners) who all work together to provide you with the care you need, when you need it.  Your next appointment:   January 14,2021 at 4:00  The format for your next appointment:   Virtual Visit   Provider:   Fransico Him, MD  Other Instructions

## 2019-09-06 ENCOUNTER — Other Ambulatory Visit: Payer: Self-pay

## 2019-09-06 MED ORDER — METOLAZONE 2.5 MG PO TABS: 2.5000 mg | ORAL_TABLET | ORAL | 6 refills | Status: DC

## 2019-09-21 ENCOUNTER — Telehealth: Payer: Medicare PPO | Admitting: Cardiology

## 2019-09-25 NOTE — Progress Notes (Signed)
Virtual Visit via telephone Note   This visit type was conducted due to national recommendations for restrictions regarding the COVID-19 Pandemic (e.g. social distancing) in an effort to limit this patient's exposure and mitigate transmission in our community.  Due to her co-morbid illnesses, this patient is at least at moderate risk for complications without adequate follow up.  This format is felt to be most appropriate for this patient at this time.  All issues noted in this document were discussed and addressed.  A limited physical exam was performed with this format.  Please refer to the patient's chart for her consent to telehealth for [facility].   Evaluation Performed:  Follow-up visit  This visit type was conducted due to national recommendations for restrictions regarding the COVID-19 Pandemic (e.g. social distancing).  This format is felt to be most appropriate for this patient at this time.  All issues noted in this document were discussed and addressed.  No physical exam was performed (except for noted visual exam findings with Video Visits).  Please refer to the patient's chart (MyChart message for video visits and phone note for telephone visits) for the patient's consent to telehealth for [facility].  Date:  09/26/2019   ID:  [name], DOB 1956-03-23, MRN [id number]  Patient Location:  Home  Provider location:   [place]  Cardiologist: Glori Bickers, MD Sleep Medicine:  Fransico Him, MD Electrophysiologist:  None   Chief Complaint:  OSA  History of Present Illness:    [name] is a 64 y.o. female who presents via audio/video conferencing for a telehealth visit today.    This is a 64yo female with a hx of Breast CA in 1998 tx with Adriamycin x 4 cycles and then dx CHF with 2007 with EF 25-35% s/p BiV-ICD.  She complained of non restorative sleep as well as daytime sleepiness at one of her OV and a sleep study was ordered.  She  underwent Home sleep study which showed moderate OSA with an [facility] of 18.6/hr with no significant central sleep apnea and O2 sats as low as 81%.  She was placed on auto CPAP.    She was seen last month and was having problems with pulling her mask off at night while she is asleep.  She was tolerating the pressure but was having problems with her mouth and had not been using it.  A full face mask was ordered and now she is back to see how she is doing.    She has been trying to wear her mask when she takes naps during the day.  She still takes it off at night in her sleep without her knowledge.  She uses the [facility] and likes the way it feels.  She is trying to wear it some during the day to help her get use to it.    The patient does not have symptoms concerning for COVID-19 infection (fever, chills, cough, or new shortness of breath).   Prior CV studies:   The following studies were reviewed today:  PAP compliance download  Past Medical History:  Diagnosis Date  . AICD (automatic cardioverter/defibrillator) present   . Biventricular implantable cardiac defibrillator)    Medtronic  . Breast cancer ([place])    s/p mastectomy and chemo with adriamycin (1998)  . CHF (congestive heart failure) (HCC)    takes Lasix and Aldactone daily  . GERD (gastroesophageal reflux disease)    takes Omeprazole daily  . Gout  takes Allopurinol daily  . History of bronchitis 2 yrs ago  . History of shingles   . Hyperlipidemia    takes Simvastatin daily  . Insomnia    takes Ambien nightly  . Nonischemic cardiomyopathy ([place])    EF 55% echo 2012  . NSVT (nonsustained ventricular tachycardia) ([place])   . Obesity   . Pneumonia 47yrs ago  . Presence of permanent cardiac pacemaker   . Shortness of breath dyspnea    with exertion  . sprint Fidelis    Past Surgical History:  Procedure Laterality Date  . ABDOMINAL HYSTERECTOMY    . APPENDECTOMY  1978  . BiV ICD  2007   Medtronic  . BIV ICD GENERTAOR CHANGE  OUT N/A 12/16/2011   Procedure: BIV ICD GENERTAOR CHANGE OUT;  Surgeon: [name], MD;  Location: [facility] CATH LAB;  Service: Cardiovascular;  Laterality: N/A;  . COLONOSCOPY    . cyst removed from wrist Left    early 70's  . ICD LEAD REMOVAL Right 10/18/2014   Procedure: ICD LEAD REMOVAL/EXTRACTION/REIMPLANTATION;  Surgeon: Evans Lance, MD;  Location: [place];  Service: Cardiovascular;  Laterality: Right;  . MASTECTOMY Left 1998  . port a cath placed  1998  . port removed  1998  . RIGHT HEART CATH N/A 06/20/2018   Procedure: RIGHT HEART CATH;  Surgeon: Jolaine Artist, MD;  Location: [place] CV LAB;  Service: Cardiovascular;  Laterality: N/A;     Current Meds  Medication Sig  . albuterol (VENTOLIN HFA) 108 (90 Base) MCG/ACT inhaler Inhale 2 puffs into the lungs as needed.  Marland Kitchen alendronate (FOSAMAX) 70 MG tablet Take 70 mg by mouth once a week.   Marland Kitchen allopurinol (ZYLOPRIM) 100 MG tablet Take 100 mg by mouth daily.   . carvedilol (COREG) 12.5 MG tablet TAKE 1 AND 1/2 TABLETS TWICE DAILY WITH A MEAL  . Cholecalciferol (VITAMIN D3) 5000 units TABS Take 5,000 Units by mouth daily.   . citalopram (CELEXA) 20 MG tablet Take 20 mg by mouth daily.   Marland Kitchen losartan (COZAAR) 50 MG tablet TAKE 1 TABLET EVERY DAY  . metolazone (ZAROXOLYN) 2.5 MG tablet Take 1 tablet (2.5 mg total) by mouth 2 (two) times a week. Monday and Friday  . multivitamin-iron-minerals-folic acid (THERAPEUTIC-M) TABS tablet Take 1 tablet by mouth daily.  Marland Kitchen omeprazole (PRILOSEC) 20 MG capsule Take 20 mg by mouth daily at 12 noon.   . potassium chloride SA (K-DUR) 20 MEQ tablet Take 40 mEq by mouth 2 (two) times daily.  . simvastatin (ZOCOR) 40 MG tablet TAKE 1 TABLET EVERY EVENING  . spironolactone (ALDACTONE) 25 MG tablet TAKE 1 TABLET DAILY  (MUST CALL AND MAKE APPOINTMENT IN ORDER TO CONTINUE GETTING REFILLS)  . torsemide (DEMADEX) 20 MG tablet TAKE 3 TABLETS TWICE DAILY  . venlafaxine XR (EFFEXOR-XR) 150 MG 24 hr capsule  Take 150 mg by mouth daily with breakfast.  . vitamin B-12 (CYANOCOBALAMIN) 100 MCG tablet Take 150 mcg by mouth daily.   Marland Kitchen zolpidem (AMBIEN) 5 MG tablet Take 5 mg by mouth at bedtime as needed. For sleep     Allergies:   Buprenorphine hcl, Morphine and related, Percocet [oxycodone-acetaminophen], and Oxycodone   Social History   Tobacco Use  . Smoking status: Never Smoker  . Smokeless tobacco: Never Used  Substance Use Topics  . Alcohol use: No  . Drug use: No     Family Hx: The patient's family history includes Alzheimer's disease in her father; Diabetes  in her mother; Heart disease in her father.  ROS:   Please see the history of present illness.     All other systems reviewed and are negative.   Labs/Other Tests and Data Reviewed:    Recent Labs: 07/10/2019: B Natriuretic Peptide 338.8; BUN 21; Creatinine, Ser 1.08; Potassium 4.2; Sodium 137   Recent Lipid Panel Lab Results  Component Value Date/Time   CHOL 133 08/01/2014 10:57 AM   TRIG 94 08/01/2014 10:57 AM   HDL 43 08/01/2014 10:57 AM   CHOLHDL 3.1 08/01/2014 10:57 AM   LDLCALC 71 08/01/2014 10:57 AM   LDLDIRECT 147.2 01/15/2009 09:41 AM    Wt Readings from Last 3 Encounters:  09/26/19 197 lb (89.4 kg)  08/24/19 198 lb (89.8 kg)  07/10/19 212 lb 12.8 oz (96.5 kg)     Objective:    Vital Signs:  BP (!) 94/48   Pulse 83   Ht 5\' 1"  (1.549 m)   Wt 197 lb (89.4 kg)   SpO2 98%   BMI 37.22 kg/m     ASSESSMENT & PLAN:    1.  OSA -   The patient is tolerating PAP therapy but is still having problems with pulling her mask off in her sleep without her knowledge. The PAP download was reviewed today and showed an AHI of 0.9/hr on auto PAP  with 10% compliance in using more than 4 hours nightly.   -she has tried the nasal mask which she pulls off as much as the FFM -I discussed the risks related to untreated OSA at length with her today including increased risk of CVA, CHF, arrhythmias, pulmonary HTN.  -I  encouraged her to keep trying with her device but I am concerned that her insurance will make her turn her device in if we cannot get better compliance -I discussed with her the Inspire device (hypoglossal nerve stimulator) which might be a good option for her.  I am concerned that she may be a little over the weight limit cutoff -I have recommended that she google this on the internet and if it is something she thinks she may be interested in then I will refer her to Dr. Wilburn Cornelia or Dr. Redmond Baseman for consultation.   2.  HTN -BP controlled and on the soft side by asymptomatic -continue Carvedilol 12.5mg  BID, Losartan 50mg  daily and spiro 25mg  daily  3. Morbid  Obesity -BMI >35 and < 40 with co morbidities -I have encouraged him to get into a routine exercise program and cut back on carbs and portions.   She will see me back in 3 months   COVID-19 Education: The signs and symptoms of COVID-19 were discussed with the patient and how to seek care for testing (follow up with PCP or arrange E-visit).  The importance of social distancing was discussed today.  Patient Risk:   After full review of this patient's clinical status, I feel that they are at least moderate risk at this time.  Time:   Today, I have spent 15 minutes directly with the patient on telemedicin discussing medical problems including OSA, HTN, Obesity.  We also reviewed the symptoms of COVID 19 and the ways to protect against contracting the virus with telehealth technology.  I spent an additional 10 minutes reviewing patient's chart including PAP compliance download, sleep study, labs.  Medication Adjustments/Labs and Tests Ordered: Current medicines are reviewed at length with the patient today.  Concerns regarding medicines are outlined above.  Tests Ordered: No orders of  the defined types were placed in this encounter.  Medication Changes: No orders of the defined types were placed in this encounter.   Disposition:   Follow up in 3 month(s)  Signed, Fransico Him, MD  09/26/2019 2:48 PM    [place]

## 2019-09-26 ENCOUNTER — Encounter: Payer: Self-pay | Admitting: Cardiology

## 2019-09-26 ENCOUNTER — Telehealth (INDEPENDENT_AMBULATORY_CARE_PROVIDER_SITE_OTHER): Payer: Medicare PPO | Admitting: Cardiology

## 2019-09-26 ENCOUNTER — Other Ambulatory Visit: Payer: Self-pay

## 2019-09-26 VITALS — BP 94/48 | HR 83 | Ht 61.0 in | Wt 197.0 lb

## 2019-09-26 DIAGNOSIS — G4733 Obstructive sleep apnea (adult) (pediatric): Secondary | ICD-10-CM

## 2019-09-26 DIAGNOSIS — I10 Essential (primary) hypertension: Secondary | ICD-10-CM

## 2019-09-26 DIAGNOSIS — E669 Obesity, unspecified: Secondary | ICD-10-CM

## 2019-09-27 ENCOUNTER — Other Ambulatory Visit (HOSPITAL_COMMUNITY): Payer: Self-pay | Admitting: Internal Medicine

## 2019-10-03 ENCOUNTER — Ambulatory Visit (INDEPENDENT_AMBULATORY_CARE_PROVIDER_SITE_OTHER): Payer: Medicare PPO | Admitting: *Deleted

## 2019-10-03 DIAGNOSIS — I5022 Chronic systolic (congestive) heart failure: Secondary | ICD-10-CM | POA: Diagnosis not present

## 2019-10-03 LAB — CUP PACEART REMOTE DEVICE CHECK
Battery Remaining Longevity: 25 mo
Battery Voltage: 2.94 V
Brady Statistic AP VP Percent: 13.86 %
Brady Statistic AP VS Percent: 0.01 %
Brady Statistic AS VP Percent: 85.66 %
Brady Statistic AS VS Percent: 0.47 %
Brady Statistic RA Percent Paced: 13.75 %
Brady Statistic RV Percent Paced: 98.57 %
Date Time Interrogation Session: 20210126043624
HighPow Impedance: 68 Ohm
Implantable Lead Implant Date: 20070821
Implantable Lead Implant Date: 20070821
Implantable Lead Implant Date: 20160211
Implantable Lead Location: 753858
Implantable Lead Location: 753859
Implantable Lead Location: 753860
Implantable Lead Model: 4194
Implantable Lead Model: 5076
Implantable Lead Model: 6935
Implantable Pulse Generator Implant Date: 20160211
Lead Channel Impedance Value: 133 Ohm
Lead Channel Impedance Value: 323 Ohm
Lead Channel Impedance Value: 437 Ohm
Lead Channel Impedance Value: 513 Ohm
Lead Channel Impedance Value: 570 Ohm
Lead Channel Impedance Value: 646 Ohm
Lead Channel Pacing Threshold Amplitude: 0.5 V
Lead Channel Pacing Threshold Amplitude: 1 V
Lead Channel Pacing Threshold Amplitude: 1.625 V
Lead Channel Pacing Threshold Pulse Width: 0.4 ms
Lead Channel Pacing Threshold Pulse Width: 0.4 ms
Lead Channel Pacing Threshold Pulse Width: 0.4 ms
Lead Channel Sensing Intrinsic Amplitude: 1.125 mV
Lead Channel Sensing Intrinsic Amplitude: 1.125 mV
Lead Channel Sensing Intrinsic Amplitude: 17 mV
Lead Channel Sensing Intrinsic Amplitude: 17 mV
Lead Channel Setting Pacing Amplitude: 1.5 V
Lead Channel Setting Pacing Amplitude: 1.5 V
Lead Channel Setting Pacing Amplitude: 2.5 V
Lead Channel Setting Pacing Pulse Width: 0.4 ms
Lead Channel Setting Pacing Pulse Width: 0.4 ms
Lead Channel Setting Sensing Sensitivity: 0.3 mV

## 2019-10-10 ENCOUNTER — Telehealth (HOSPITAL_COMMUNITY): Payer: Self-pay

## 2019-10-10 NOTE — Telephone Encounter (Signed)

## 2019-10-11 ENCOUNTER — Ambulatory Visit (HOSPITAL_BASED_OUTPATIENT_CLINIC_OR_DEPARTMENT_OTHER)
Admission: RE | Admit: 2019-10-11 | Discharge: 2019-10-11 | Disposition: A | Discharge status: Home / Self Care | Payer: Medicare PPO | Source: Ambulatory Visit | Attending: Internal Medicine | Admitting: Internal Medicine

## 2019-10-11 ENCOUNTER — Encounter (HOSPITAL_COMMUNITY): Payer: Self-pay | Admitting: Internal Medicine

## 2019-10-11 ENCOUNTER — Other Ambulatory Visit: Payer: Self-pay

## 2019-10-11 ENCOUNTER — Ambulatory Visit (HOSPITAL_COMMUNITY)
Admission: RE | Admit: 2019-10-11 | Discharge: 2019-10-11 | Disposition: A | Discharge status: Home / Self Care | Payer: Medicare PPO | Source: Ambulatory Visit | Attending: Internal Medicine | Admitting: Internal Medicine

## 2019-10-11 VITALS — BP 118/62 | HR 82 | Wt 212.4 lb

## 2019-10-11 DIAGNOSIS — I10 Essential (primary) hypertension: Secondary | ICD-10-CM | POA: Diagnosis not present

## 2019-10-11 DIAGNOSIS — I959 Hypotension, unspecified: Secondary | ICD-10-CM | POA: Diagnosis not present

## 2019-10-11 DIAGNOSIS — G4733 Obstructive sleep apnea (adult) (pediatric): Secondary | ICD-10-CM

## 2019-10-11 DIAGNOSIS — E669 Obesity, unspecified: Secondary | ICD-10-CM

## 2019-10-11 DIAGNOSIS — I5022 Chronic systolic (congestive) heart failure: Secondary | ICD-10-CM

## 2019-10-11 DIAGNOSIS — I081 Rheumatic disorders of both mitral and tricuspid valves: Secondary | ICD-10-CM | POA: Insufficient documentation

## 2019-10-11 DIAGNOSIS — I480 Paroxysmal atrial fibrillation: Secondary | ICD-10-CM

## 2019-10-11 DIAGNOSIS — R0602 Shortness of breath: Secondary | ICD-10-CM | POA: Insufficient documentation

## 2019-10-11 LAB — CBC
HCT: 36.1 % (ref 36.0–46.0)
Hemoglobin: 11.7 g/dL — ABNORMAL LOW (ref 12.0–15.0)
MCH: 32.6 pg (ref 26.0–34.0)
MCHC: 32.4 g/dL (ref 30.0–36.0)
MCV: 100.6 fL — ABNORMAL HIGH (ref 80.0–100.0)
Platelets: 282 10*3/uL (ref 150–400)
RBC: 3.59 MIL/uL — ABNORMAL LOW (ref 3.87–5.11)
RDW: 14.1 % (ref 11.5–15.5)
WBC: 8.6 10*3/uL (ref 4.0–10.5)
nRBC: 0 % (ref 0.0–0.2)

## 2019-10-11 LAB — BASIC METABOLIC PANEL
Anion gap: 15 (ref 5–15)
BUN: 33 mg/dL — ABNORMAL HIGH (ref 8–23)
CO2: 28 mmol/L (ref 22–32)
Calcium: 10.1 mg/dL (ref 8.9–10.3)
Chloride: 92 mmol/L — ABNORMAL LOW (ref 98–111)
Creatinine, Ser: 1.3 mg/dL — ABNORMAL HIGH (ref 0.44–1.00)
GFR calc Af Amer: 50 mL/min — ABNORMAL LOW (ref >60)
GFR calc non Af Amer: 43 mL/min — ABNORMAL LOW (ref >60)
Glucose, Bld: 118 mg/dL — ABNORMAL HIGH (ref 70–99)
Potassium: 2.8 mmol/L — ABNORMAL LOW (ref 3.5–5.1)
Sodium: 135 mmol/L (ref 135–145)

## 2019-10-11 LAB — BRAIN NATRIURETIC PEPTIDE: B Natriuretic Peptide: 229.4 pg/mL — ABNORMAL HIGH (ref 0.0–100.0)

## 2019-10-11 NOTE — Patient Instructions (Signed)
NO changes today!  Labs today We will only contact you if something comes back abnormal or we need to make some changes. Otherwise no news is good news!  Your physician recommends that you schedule a follow-up appointment in: 6 months with Dr Haroldine Laws. We will contact you in a few months to schedule your next appointment.  Please call office at [phone] option 2 if you have any questions or concerns.   At the [facility], you and your health needs are our priority. As part of our continuing mission to provide you with exceptional heart care, we have created designated Provider Care Teams. These Care Teams include your primary Cardiologist (physician) and Advanced Practice Providers (APPs- Physician Assistants and Nurse Practitioners) who all work together to provide you with the care you need, when you need it.   You may see any of the following providers on your designated Care Team at your next follow up: Marland Kitchen Dr Glori Bickers . Dr Loralie Champagne . Darrick Grinder, NP . Lyda Jester, PA . Audry Riles, PharmD   Please be sure to bring in all your medications bottles to every appointment.

## 2019-10-11 NOTE — Progress Notes (Addendum)
Patient ID: [name], female   DOB: 09-Mar-1956, 64 y.o.   MRN: [id number]   ADVANCED HE CLINIC NOTE  PCP: Dr Raeanne Gathers  HPI: [name] is a 64 y/o woman with history of breast CA in 1998 treated with Adriamycin x 4 cycles. In 2007, diagnosed with CHF with EF 25-35%. She is s/p Medtronic BiV-ICD in 2008 with Sprint Fidelis lead which was subsequently replaced. LV function has subsequently recovered  Had cath 12/10. Coronaries normal. Right atrial pressure mean of 7, RV pressure 28/6 . Pulmonary artery pressure was 23/9 with mean of 15.  Pulmonary capillary wedge pressure mean of 9. Fick cardiac output 3.9 L per minute.  Cardiac index 2.2 L per minute per meter squared.  Pulmonary vascular resistance was 1.5 [place] units.  [place] 10/19 RA = 11 RV = 27/5 PA = 24/3 (17) PCW = 9 Fick cardiac output/index = 4.8/2.5 PVR = 1.8 WU Ao sat = 98% PA sat = 66%, 67%  Follow up: Here for routine f/u. Recent sleep study with moderate OSA AHI 18.6/hr with desats to 81%. Unable to tolerate CPAP masks (tried 3 types). Working with Dr. Radford Pax. Can do ADLs without too much trouble but gets SOB if tries to do more or hurry. Taking metolazone M/T/F. Mild edema, nor orthopnea or PND.   ICD interrogation: 12 NSVT episodes, 1 with varied rates 150-180bpm, 28 beats. AF burden 0.7%. No VF.   Echo today EF 50%-55% mild to moderate TR Personally reviewed   Echo 05/10/18 EF 40-45% (I reviewed and felt 45%) Moderate TR with some septal flattening. RV ok  Personally reviewed  Echo 11/17: EF 50-55%   Echo 5/11: EF 50-55%  12/15/11 ECHO EF 50%. RV normal. 12/16/11 ICD generator replaced due to EOL 04/2013 EF 50% 07/19/14 Echo EF 50% inferior HK GLS -17.8 6/16 Echo EF 45-50   ROS: All systems negative except as listed in HPI, PMH and Problem List.  Past Medical History:  Diagnosis Date  . AICD (automatic cardioverter/defibrillator) present   . Biventricular implantable cardiac defibrillator)    Medtronic  .  Breast cancer ([facility])    s/p mastectomy and chemo with adriamycin (1998)  . CHF (congestive heart failure) (HCC)    takes Lasix and Aldactone daily  . GERD (gastroesophageal reflux disease)    takes Omeprazole daily  . Gout    takes Allopurinol daily  . History of bronchitis 2 yrs ago  . History of shingles   . Hyperlipidemia    takes Simvastatin daily  . Insomnia    takes Ambien nightly  . Nonischemic cardiomyopathy ([facility])    EF 55% echo 2012  . NSVT (nonsustained ventricular tachycardia) ([facility])   . Obesity   . Pneumonia 23yrs ago  . Presence of permanent cardiac pacemaker   . Shortness of breath dyspnea    with exertion  . sprint Fidelis     Current Outpatient Medications  Medication Sig Dispense Refill  . albuterol (VENTOLIN HFA) 108 (90 Base) MCG/ACT inhaler Inhale 2 puffs into the lungs as needed.    Marland Kitchen alendronate (FOSAMAX) 70 MG tablet Take 70 mg by mouth once a week.     Marland Kitchen allopurinol (ZYLOPRIM) 100 MG tablet Take 100 mg by mouth daily.     . carvedilol (COREG) 12.5 MG tablet TAKE 1 AND 1/2 TABLETS TWICE DAILY WITH A MEAL 270 tablet 3  . Cholecalciferol (VITAMIN D3) 5000 units TABS Take 5,000 Units by mouth daily.     . citalopram (CELEXA) 20  MG tablet Take 20 mg by mouth daily.     Marland Kitchen losartan (COZAAR) 50 MG tablet TAKE 1 TABLET EVERY DAY 90 tablet 1  . metolazone (ZAROXOLYN) 2.5 MG tablet Take 1 tablet (2.5 mg total) by mouth 2 (two) times a week. Monday and Friday 13 tablet 6  . multivitamin-iron-minerals-folic acid (THERAPEUTIC-M) TABS tablet Take 1 tablet by mouth daily.    Marland Kitchen omeprazole (PRILOSEC) 20 MG capsule Take 20 mg by mouth daily at 12 noon.     . potassium chloride SA (K-DUR) 20 MEQ tablet Take 40 mEq by mouth 2 (two) times daily.    . simvastatin (ZOCOR) 40 MG tablet TAKE 1 TABLET EVERY EVENING 90 tablet 1  . spironolactone (ALDACTONE) 25 MG tablet TAKE 1 TABLET DAILY  (MUST CALL AND MAKE APPOINTMENT IN ORDER TO CONTINUE GETTING REFILLS) 90 tablet 3  .  torsemide (DEMADEX) 20 MG tablet TAKE 3 TABLETS TWICE DAILY 540 tablet 2  . venlafaxine XR (EFFEXOR-XR) 150 MG 24 hr capsule Take 150 mg by mouth daily with breakfast.    . vitamin B-12 (CYANOCOBALAMIN) 100 MCG tablet Take 150 mcg by mouth daily.     Marland Kitchen zolpidem (AMBIEN) 5 MG tablet Take 5 mg by mouth at bedtime as needed. For sleep     No current facility-administered medications for this encounter.   PHYSICAL EXAM: Vitals:   10/11/19 0856  BP: 118/62  Pulse: 82  SpO2: 100%  Weight: 96.3 kg (212 lb 6.4 oz)   Wt Readings from Last 3 Encounters:  10/11/19 96.3 kg (212 lb 6.4 oz)  09/26/19 89.4 kg (197 lb)  08/24/19 89.8 kg (198 lb)    General:  Well appearing. No resp difficulty HEENT: normal Neck: supple.  JVP 8-9 Carotids 2+ bilat; no bruits. No lymphadenopathy or thryomegaly appreciated. Cor: PMI nondisplaced. Regular rate & rhythm. 2/6 TR Lungs: clear Abdomen: obese soft, nontender, nondistended. No hepatosplenomegaly. No bruits or masses. Good bowel sounds. Extremities: no cyanosis, clubbing, rash, trace edema Neuro: alert & orientedx3, cranial nerves grossly intact. moves all 4 extremities w/o difficulty. 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 EF 50-55% on echo 11/17 - Echo 9/19 EF 45% with septal dyssynergy (despite biv pacing). EF looks normal when septum not in view. - Echo today EF 50-55% mild to moderate TR - Stable NYHA III - RHC in 10/19 with relatively normal hemodynamics except for RA 10 (PCWP was 9)  -Volume status up and down. Now taking metolazone 3x/week. Would try to keep it to Mon/Fri and use a 3rd dose as needed.  - Check labs today - If not improving may need to consider Cardiomems but I would like to see if we can get OSA treated    2) Medtronic BiV ICD:  -ICD interrogation done in clinic: No VT/AF 100% BiV pacing. 0.7% AF  3) HTN - Blood pressure well controlled. Continue current  regimen.  4) Fatigue/snoring - Sleep study with moderate OSA. Following with Dr. Radford Pax. Unable to tolerate any CPAP. - BMI too high for Inspire - Encourage weight loss   6) PAF (SCAF)  - 0.7% by ICD - This patients CHA2DS2-VASc 4 - D/w Dr. Caryl Comes given duration < 24 benefit of AC likely does not justify risk based on ASSERT data  7) Tricuspid regurgitation - mild to moderate  - may need repeat RHC to reassess PA pressures    Glori Bickers MD  10/11/2019 9:28 AM

## 2019-10-11 NOTE — Progress Notes (Signed)
  Echocardiogram 2D Echocardiogram has been performed.  [name] 10/11/2019, 8:41 AM

## 2019-10-19 ENCOUNTER — Other Ambulatory Visit: Payer: Self-pay | Admitting: Internal Medicine

## 2019-10-20 LAB — BASIC METABOLIC PANEL
BUN/Creatinine Ratio: 22 (ref 12–28)
BUN: 30 mg/dL — ABNORMAL HIGH (ref 8–27)
CO2: 28 mmol/L (ref 20–29)
Calcium: 10.1 mg/dL (ref 8.7–10.3)
Chloride: 92 mmol/L — ABNORMAL LOW (ref 96–106)
Creatinine, Ser: 1.34 mg/dL — ABNORMAL HIGH (ref 0.57–1.00)
GFR calc Af Amer: 48 mL/min/{1.73_m2} — ABNORMAL LOW (ref >59)
GFR calc non Af Amer: 42 mL/min/{1.73_m2} — ABNORMAL LOW (ref >59)
Glucose: 111 mg/dL — ABNORMAL HIGH (ref 65–99)
Potassium: 3.8 mmol/L (ref 3.5–5.2)
Sodium: 138 mmol/L (ref 134–144)

## 2019-10-30 ENCOUNTER — Other Ambulatory Visit (HOSPITAL_COMMUNITY): Payer: Self-pay | Admitting: Internal Medicine

## 2019-11-19 ENCOUNTER — Other Ambulatory Visit (HOSPITAL_COMMUNITY): Payer: Self-pay | Admitting: Internal Medicine

## 2019-12-01 ENCOUNTER — Telehealth (HOSPITAL_COMMUNITY): Payer: Self-pay | Admitting: *Deleted

## 2019-12-01 NOTE — Telephone Encounter (Signed)
Noted and Dr Radford Pax is aware.

## 2019-12-01 NOTE — Telephone Encounter (Signed)
Received fax from [facility] stating pt had returned her PAP equipment.  Per last OV notes w/Dr Bensimhon and Dr Radford Pax pt was having difficulty and was non-compliant with wearing it.

## 2019-12-19 ENCOUNTER — Telehealth: Payer: Medicare PPO | Admitting: Cardiology

## 2019-12-28 ENCOUNTER — Other Ambulatory Visit (HOSPITAL_COMMUNITY): Payer: Self-pay | Admitting: Internal Medicine

## 2019-12-31 ENCOUNTER — Other Ambulatory Visit: Payer: Self-pay | Admitting: Internal Medicine

## 2019-12-31 ENCOUNTER — Other Ambulatory Visit (HOSPITAL_COMMUNITY): Payer: Self-pay | Admitting: Internal Medicine

## 2020-01-02 ENCOUNTER — Ambulatory Visit (INDEPENDENT_AMBULATORY_CARE_PROVIDER_SITE_OTHER): Payer: Medicare PPO | Admitting: *Deleted

## 2020-01-02 DIAGNOSIS — I5022 Chronic systolic (congestive) heart failure: Secondary | ICD-10-CM

## 2020-01-02 LAB — CUP PACEART REMOTE DEVICE CHECK
Battery Remaining Longevity: 24 mo
Battery Voltage: 2.94 V
Brady Statistic AP VP Percent: 11.33 %
Brady Statistic AP VS Percent: 0.01 %
Brady Statistic AS VP Percent: 88.61 %
Brady Statistic AS VS Percent: 0.05 %
Brady Statistic RA Percent Paced: 11.25 %
Brady Statistic RV Percent Paced: 99.22 %
Date Time Interrogation Session: 20210427001604
HighPow Impedance: 90 Ohm
Implantable Lead Implant Date: 20070821
Implantable Lead Implant Date: 20070821
Implantable Lead Implant Date: 20160211
Implantable Lead Location: 753858
Implantable Lead Location: 753859
Implantable Lead Location: 753860
Implantable Lead Model: 4194
Implantable Lead Model: 5076
Implantable Lead Model: 6935
Implantable Pulse Generator Implant Date: 20160211
Lead Channel Impedance Value: 171 Ohm
Lead Channel Impedance Value: 380 Ohm
Lead Channel Impedance Value: 513 Ohm
Lead Channel Impedance Value: 627 Ohm
Lead Channel Impedance Value: 703 Ohm
Lead Channel Impedance Value: 722 Ohm
Lead Channel Pacing Threshold Amplitude: 0.375 V
Lead Channel Pacing Threshold Amplitude: 0.875 V
Lead Channel Pacing Threshold Amplitude: 1.5 V
Lead Channel Pacing Threshold Pulse Width: 0.4 ms
Lead Channel Pacing Threshold Pulse Width: 0.4 ms
Lead Channel Pacing Threshold Pulse Width: 0.4 ms
Lead Channel Sensing Intrinsic Amplitude: 1.125 mV
Lead Channel Sensing Intrinsic Amplitude: 1.125 mV
Lead Channel Sensing Intrinsic Amplitude: 20.875 mV
Lead Channel Sensing Intrinsic Amplitude: 20.875 mV
Lead Channel Setting Pacing Amplitude: 1.5 V
Lead Channel Setting Pacing Amplitude: 1.5 V
Lead Channel Setting Pacing Amplitude: 2.25 V
Lead Channel Setting Pacing Pulse Width: 0.4 ms
Lead Channel Setting Pacing Pulse Width: 0.4 ms
Lead Channel Setting Sensing Sensitivity: 0.3 mV

## 2020-01-03 NOTE — Progress Notes (Signed)
ICD Remote  

## 2020-01-25 ENCOUNTER — Other Ambulatory Visit: Payer: Self-pay | Admitting: Internal Medicine

## 2020-03-07 ENCOUNTER — Telehealth: Payer: Self-pay

## 2020-03-07 ENCOUNTER — Telehealth (INDEPENDENT_AMBULATORY_CARE_PROVIDER_SITE_OTHER): Payer: Medicare PPO | Admitting: Internal Medicine

## 2020-03-07 ENCOUNTER — Other Ambulatory Visit: Payer: Self-pay

## 2020-03-07 VITALS — BP 120/72 | HR 82 | Temp 97.9°F | Ht 62.0 in | Wt 204.0 lb

## 2020-03-07 DIAGNOSIS — I4729 Other ventricular tachycardia: Secondary | ICD-10-CM

## 2020-03-07 DIAGNOSIS — I472 Ventricular tachycardia: Secondary | ICD-10-CM | POA: Diagnosis not present

## 2020-03-07 DIAGNOSIS — Z95810 Presence of automatic (implantable) cardiac defibrillator: Secondary | ICD-10-CM

## 2020-03-07 DIAGNOSIS — I428 Other cardiomyopathies: Secondary | ICD-10-CM

## 2020-03-07 DIAGNOSIS — I5022 Chronic systolic (congestive) heart failure: Secondary | ICD-10-CM | POA: Diagnosis not present

## 2020-03-07 NOTE — Progress Notes (Signed)
Electrophysiology TeleHealth Note   Due to national recommendations of social distancing due to COVID 19, an audio/video telehealth visit is felt to be most appropriate for this patient at this time.  See MyChart message from today for the patient's consent to telehealth for [facility].   Date:  03/07/2020   ID:  [name], DOB [birth year], MRN [id number]  Location: patient's home  Provider location: [address], [place]  Evaluation Performed: Follow-up visit  PCP:  Sueanne Margarita, MD  Cardiologist:     Electrophysiologist:  SK   Chief Complaint:   chf  History of Present Illness:    [name] is a 64 y.o. female who presents via audio/video conferencing for a telehealth visit today.  Since last being seen in our clinic for CRT D and NICM  the patient reports *having problems with fluid accumulation.  She took increased diuretics (extra metolazone from baseline 2>>3/wk as needed) and was told that her renal function was worsening     Date Cr K Hgb  2/21 1.34 3.8<<2.8 11.7  6/21 1.68 4.2)     DATE TEST EF   6/21 Echo 45-50 %           The patient denies symptoms of fevers, chills, cough, or new SOB worrisome for COVID 19.    Past Medical History:  Diagnosis Date   AICD (automatic cardioverter/defibrillator) present    Biventricular implantable cardiac defibrillator)    Medtronic   Breast cancer [facility])    s/p mastectomy and chemo with adriamycin (1998)   CHF (congestive heart failure) (HCC)    takes Lasix and Aldactone daily   GERD (gastroesophageal reflux disease)    takes Omeprazole daily   Gout    takes Allopurinol daily   History of bronchitis 2 yrs ago   History of shingles    Hyperlipidemia    takes Simvastatin daily   Insomnia    takes Ambien nightly   Nonischemic cardiomyopathy ([place])    EF 55% echo 2012   NSVT (nonsustained ventricular tachycardia) ([place])    Obesity    Pneumonia 57yrs ago    Presence of permanent cardiac pacemaker    Shortness of breath dyspnea    with exertion   sprint Fidelis     Past Surgical History:  Procedure Laterality Date   ABDOMINAL HYSTERECTOMY     APPENDECTOMY  1978   BiV ICD  2007   Medtronic   BIV ICD GENERTAOR CHANGE OUT N/A 12/16/2011   Procedure: BIV ICD [place];  Surgeon: [name], MD;  Location: [facility] CATH LAB;  Service: Cardiovascular;  Laterality: N/A;   COLONOSCOPY     cyst removed from wrist Left    early 70's   ICD LEAD REMOVAL Right 10/18/2014   Procedure: ICD LEAD REMOVAL/EXTRACTION/REIMPLANTATION;  Surgeon: Evans Lance, MD;  Location: [facility];  Service: Cardiovascular;  Laterality: Right;   MASTECTOMY Left 1998   port a cath placed  1998   port removed  [place] CATH N/A 06/20/2018   Procedure: RIGHT HEART CATH;  Surgeon: Jolaine Artist, MD;  Location: [facility] CV LAB;  Service: Cardiovascular;  Laterality: N/A;    Current Outpatient Medications  Medication Sig Dispense Refill   albuterol (VENTOLIN HFA) 108 (90 Base) MCG/ACT inhaler Inhale 2 puffs into the lungs as needed.     alendronate (FOSAMAX) 70 MG tablet Take 70 mg by mouth once a week.  allopurinol (ZYLOPRIM) 100 MG tablet Take 100 mg by mouth daily.      carvedilol (COREG) 12.5 MG tablet Take 1.5 tablets (18.75 mg total) by mouth 2 (two) times daily with a meal. Please make yearly appt with Dr. Caryl Comes for May before anymore refills. 1st attempt 270 tablet 0   Cholecalciferol (VITAMIN D3) 5000 units TABS Take 5,000 Units by mouth daily.      citalopram (CELEXA) 20 MG tablet Take 20 mg by mouth daily.      losartan (COZAAR) 50 MG tablet TAKE 1 TABLET EVERY DAY 90 tablet 3   metolazone (ZAROXOLYN) 2.5 MG tablet Take 1 tablet (2.5 mg total) by mouth 2 (two) times a week. Monday and Friday 13 tablet 6   multivitamin-iron-minerals-folic acid (THERAPEUTIC-M) TABS tablet Take 1 tablet by mouth daily.     omeprazole  (PRILOSEC) 20 MG capsule Take 20 mg by mouth daily at 12 noon.      potassium chloride SA (K-DUR) 20 MEQ tablet Take 40 mEq by mouth 2 (two) times daily.     simvastatin (ZOCOR) 40 MG tablet TAKE 1 TABLET EVERY EVENING 90 tablet 1   spironolactone (ALDACTONE) 25 MG tablet TAKE 1 TABLET DAILY  90 tablet 3   torsemide (DEMADEX) 20 MG tablet Take 3 tablets (60 mg total) by mouth 2 (two) times daily. 540 tablet 3   venlafaxine XR (EFFEXOR-XR) 150 MG 24 hr capsule Take 150 mg by mouth daily with breakfast.     vitamin B-12 (CYANOCOBALAMIN) 100 MCG tablet Take 150 mcg by mouth daily.      zolpidem (AMBIEN) 5 MG tablet Take 5 mg by mouth at bedtime as needed. For sleep     No current facility-administered medications for this visit.    Allergies:   Buprenorphine hcl, Morphine and related, Percocet [oxycodone-acetaminophen], and Oxycodone   Social History:  The patient  reports that she has never smoked. She has never used smokeless tobacco. She reports that she does not drink alcohol and does not use drugs.   Family History:  The patient's   family history includes Alzheimer's disease in her father; Diabetes in her mother; Heart disease in her father.   ROS:  Please see the history of present illness.   All other systems are personally reviewed and negative.    Exam:    Vital Signs:  BP 120/72    Pulse 82    Temp 97.9 F (36.6 C)    Ht 5\' 2"  (1.575 m)    Wt 204 lb (92.5 kg)    BMI 37.31 kg/m  *       Labs/Other Tests and Data Reviewed:    Recent Labs: 10/11/2019: B Natriuretic Peptide 229.4; Hemoglobin 11.7; Platelets 282 10/19/2019: BUN 30; Creatinine, Ser 1.34; Potassium 3.8; Sodium 138   Wt Readings from Last 3 Encounters:  03/07/20 204 lb (92.5 kg)  10/11/19 212 lb 6.4 oz (96.3 kg)  09/26/19 197 lb (89.4 kg)     Other studies personally reviewed: Additional studies/ records that were reviewed today include:  Device function interrogated   ASSESSMENT & PLAN:     Nonischemic cardiomyopathy  Congestive heart failure-chronic systolic grade 2-3  Defibrillator lead failure with prior extraction  VT nonsustained  Atrial fibrillation-- sub clinical SCAF   Implantable defibrillator-CRT-Medtronic  CKD class 3  Volume inssues which she is addressing by increase metolazone, which has been assoc with modest increase in her Cr   She is scheduled to have it rechecked and we  discussed the competing therapeutic targets of less edema and renal perfusion  For now she will continue on the 2/x regime Rx by DB and will use a 3rd dose for significant change in swelling;  Her GFR is not approaching 30 ( for aldactone)  COVID 19 screen The patient denies symptoms of COVID 19 at this time.  The importance of social distancing was discussed today.  Follow-up:  63m    Current medicines are reviewed at length with the patient today.   The patient does not have concerns regarding her medicines.  The following changes were made today:  none  Labs/ tests ordered today include:   No orders of the defined types were placed in this encounter.   Future tests ( post COVID )     Patient Risk:  after full review of this patients clinical status, I feel that they are at moderate  risk at this time.  Today, I have spent  17 minutes with the patient with telehealth technology discussing the above.  Signed, Virl Axe, MD  03/07/2020 4:12 PM     [place] [place] [address] [phone] (office) [phone] (fax)

## 2020-03-07 NOTE — Telephone Encounter (Signed)
  Patient Consent for Virtual Visit         [name] has provided verbal consent on 03/07/2020 for a virtual visit (video or telephone).   CONSENT FOR VIRTUAL VISIT FOR:  [name]  By participating in this virtual visit I agree to the following:  I hereby voluntarily request, consent and authorize [facility] and its employed or contracted physicians, physician assistants, nurse practitioners or other licensed health care professionals (the Practitioner), to provide me with telemedicine health care services (the "Services") as deemed necessary by the treating Practitioner. I acknowledge and consent to receive the Services by the Practitioner via telemedicine. I understand that the telemedicine visit will involve communicating with the Practitioner through live audiovisual communication technology and the disclosure of certain medical information by electronic transmission. I acknowledge that I have been given the opportunity to request an in-person assessment or other available alternative prior to the telemedicine visit and am voluntarily participating in the telemedicine visit.  I understand that I have the right to withhold or withdraw my consent to the use of telemedicine in the course of my care at any time, without affecting my right to future care or treatment, and that the Practitioner or I may terminate the telemedicine visit at any time. I understand that I have the right to inspect all information obtained and/or recorded in the course of the telemedicine visit and may receive copies of available information for a reasonable fee.  I understand that some of the potential risks of receiving the Services via telemedicine include:  Marland Kitchen Delay or interruption in medical evaluation due to technological equipment failure or disruption; . Information transmitted may not be sufficient (e.g. poor resolution of images) to allow for appropriate medical decision making by the  Practitioner; and/or  . In rare instances, security protocols could fail, causing a breach of personal health information.  Furthermore, I acknowledge that it is my responsibility to provide information about my medical history, conditions and care that is complete and accurate to the best of my ability. I acknowledge that Practitioner's advice, recommendations, and/or decision may be based on factors not within their control, such as incomplete or inaccurate data provided by me or distortions of diagnostic images or specimens that may result from electronic transmissions. I understand that the practice of medicine is not an exact science and that Practitioner makes no warranties or guarantees regarding treatment outcomes. I acknowledge that a copy of this consent can be made available to me via my patient portal ([facility]), or I can request a printed copy by calling the office of [facility].    I understand that my insurance will be billed for this visit.   I have read or had this consent read to me. . I understand the contents of this consent, which adequately explains the benefits and risks of the Services being provided via telemedicine.  . I have been provided ample opportunity to ask questions regarding this consent and the Services and have had my questions answered to my satisfaction. . I give my informed consent for the services to be provided through the use of telemedicine in my medical care

## 2020-03-07 NOTE — Patient Instructions (Signed)

## 2020-03-15 ENCOUNTER — Other Ambulatory Visit (HOSPITAL_COMMUNITY): Payer: Self-pay | Admitting: Internal Medicine

## 2020-03-16 ENCOUNTER — Other Ambulatory Visit: Payer: Self-pay | Admitting: Internal Medicine

## 2020-03-19 ENCOUNTER — Other Ambulatory Visit (HOSPITAL_COMMUNITY): Payer: Self-pay | Admitting: Internal Medicine

## 2020-04-02 ENCOUNTER — Ambulatory Visit (INDEPENDENT_AMBULATORY_CARE_PROVIDER_SITE_OTHER): Payer: Medicare PPO | Admitting: *Deleted

## 2020-04-02 DIAGNOSIS — I428 Other cardiomyopathies: Secondary | ICD-10-CM | POA: Diagnosis not present

## 2020-04-02 LAB — CUP PACEART REMOTE DEVICE CHECK
Battery Remaining Longevity: 23 mo
Battery Voltage: 2.93 V
Brady Statistic AP VP Percent: 9.78 %
Brady Statistic AP VS Percent: 0.01 %
Brady Statistic AS VP Percent: 90.2 %
Brady Statistic AS VS Percent: 0.01 %
Brady Statistic RA Percent Paced: 9.79 %
Brady Statistic RV Percent Paced: 99.91 %
Date Time Interrogation Session: 20210727012404
HighPow Impedance: 80 Ohm
Implantable Lead Implant Date: 20070821
Implantable Lead Implant Date: 20070821
Implantable Lead Implant Date: 20160211
Implantable Lead Location: 753858
Implantable Lead Location: 753859
Implantable Lead Location: 753860
Implantable Lead Model: 4194
Implantable Lead Model: 5076
Implantable Lead Model: 6935
Implantable Pulse Generator Implant Date: 20160211
Lead Channel Impedance Value: 171 Ohm
Lead Channel Impedance Value: 323 Ohm
Lead Channel Impedance Value: 456 Ohm
Lead Channel Impedance Value: 570 Ohm
Lead Channel Impedance Value: 627 Ohm
Lead Channel Impedance Value: 665 Ohm
Lead Channel Pacing Threshold Amplitude: 0.375 V
Lead Channel Pacing Threshold Amplitude: 1.125 V
Lead Channel Pacing Threshold Amplitude: 1.5 V
Lead Channel Pacing Threshold Pulse Width: 0.4 ms
Lead Channel Pacing Threshold Pulse Width: 0.4 ms
Lead Channel Pacing Threshold Pulse Width: 0.4 ms
Lead Channel Sensing Intrinsic Amplitude: 0.875 mV
Lead Channel Sensing Intrinsic Amplitude: 0.875 mV
Lead Channel Sensing Intrinsic Amplitude: 18.375 mV
Lead Channel Sensing Intrinsic Amplitude: 18.375 mV
Lead Channel Setting Pacing Amplitude: 1.5 V
Lead Channel Setting Pacing Amplitude: 1.75 V
Lead Channel Setting Pacing Amplitude: 2.25 V
Lead Channel Setting Pacing Pulse Width: 0.4 ms
Lead Channel Setting Pacing Pulse Width: 0.4 ms
Lead Channel Setting Sensing Sensitivity: 0.3 mV

## 2020-04-05 ENCOUNTER — Telehealth: Payer: Self-pay | Admitting: Cardiology

## 2020-04-05 NOTE — Progress Notes (Signed)
Remote ICD transmission.   

## 2020-04-05 NOTE — Telephone Encounter (Signed)
Pt called the community line and asking if I could get a message to [place], Raquel Sarna C. Pt would like to discuss further the pacemaker transmission. Pt states she is beginning to have issues with her kidneys, and wants to know if this can be related.

## 2020-04-08 NOTE — Telephone Encounter (Signed)
Returned call to patient today as this RN was out of office on 04/05/20. Pt reports her weight is up 5-6lbs over the past 3 days. Increased SOB with exertion, but denies symptoms at rest. Ankles edematous and abdomen bloated. Pt reports compliance with medications, including metolazone 2.5mg  3x/week and torsemide 60mg  BID. Pt also concerned about renal function. Advised will send message to Dr. Haroldine Laws and Nira Conn, RN, for advisement as Dr. Caryl Comes is out of the office. [facility] phone number also provided. ED precautions reviewed for worsening symptoms overnight. Pt verbalizes understanding and appreciation of call.

## 2020-04-09 ENCOUNTER — Telehealth: Payer: Self-pay

## 2020-04-09 NOTE — Telephone Encounter (Signed)
Spoke with pt who has question about her renal function as she reports her PCP completed her last labwork which shows her her Creatinine per PCP has gone from 1.65 to 1.68 in a matter of days.  Pt states she has spoken with Dr Caryl Comes who states she is between a rock and a hard place as she must take her fluid medications which can also alter kidney function.  Pt advised how important it is for her to take medications as prescribed and continue her close follow up with Dr Haroldine Laws. Pt states she just does not want to have to go on dialysis.   Discussed staging of renal disease with pt and encouraged her to speak with her PCP about further concerns.  Pt verbalized understanding and agrees with current plan.

## 2020-04-09 NOTE — Telephone Encounter (Signed)
Please advise in DB absence

## 2020-04-11 NOTE — Telephone Encounter (Signed)
Patient advised and verbalized understanding. Labs order faxed to patients pcp [phone]

## 2020-06-14 ENCOUNTER — Other Ambulatory Visit: Payer: Self-pay | Admitting: Emergency Medicine

## 2020-06-14 ENCOUNTER — Telehealth: Payer: Self-pay | Admitting: Emergency Medicine

## 2020-06-14 NOTE — Telephone Encounter (Signed)
Reviewed EGM with Dr Caryl Comes and he confirmed AF with RVR with inappropriate shock. No driving restrictions needed. Continue to monitor. Patient notified.

## 2020-06-14 NOTE — Telephone Encounter (Deleted)
Alert received for VT that

## 2020-06-14 NOTE — Telephone Encounter (Addendum)
Alert received for VT/VF episode that occurred 06/13/20 at 2325 that was successfully terminated by 1 36 J shock . Event appears to be atrial driven , AFL with RVR that terminated with the 36 J shock .She reports she had just gotten out of the shower when she felt the shock. She reports being asymptomatic prior to event and after the shock. She reports she was discharged 06/12/20 after 6 day admission for Covid-19 at [facility]. She takes Coreg 12.5 mg BID. [place] DMV driving restrictions given due to treatment by device.

## 2020-06-19 ENCOUNTER — Telehealth (HOSPITAL_COMMUNITY): Payer: Self-pay | Admitting: *Deleted

## 2020-06-19 NOTE — Telephone Encounter (Signed)
Haynes Bast with [facility] left a VM requesting a call from [place] to discuss patients care. Patient was recently discharged from the hospital with covid pt was septic during admission. NP requests a call back to discuss medications and a plan for patient moving forward.  Routed to Dr.Bensimhon   Haynes Bast call back # [phone]

## 2020-07-02 ENCOUNTER — Ambulatory Visit (INDEPENDENT_AMBULATORY_CARE_PROVIDER_SITE_OTHER): Payer: Medicare PPO

## 2020-07-02 DIAGNOSIS — I5022 Chronic systolic (congestive) heart failure: Secondary | ICD-10-CM | POA: Diagnosis not present

## 2020-07-02 LAB — CUP PACEART REMOTE DEVICE CHECK
Battery Remaining Longevity: 18 mo
Battery Voltage: 2.92 V
Brady Statistic AP VP Percent: 5.59 %
Brady Statistic AP VS Percent: 0.01 %
Brady Statistic AS VP Percent: 94.39 %
Brady Statistic AS VS Percent: 0.01 %
Brady Statistic RA Percent Paced: 5.6 %
Brady Statistic RV Percent Paced: 99.87 %
Date Time Interrogation Session: 20211026001605
HighPow Impedance: 85 Ohm
Implantable Lead Implant Date: 20070821
Implantable Lead Implant Date: 20070821
Implantable Lead Implant Date: 20160211
Implantable Lead Location: 753858
Implantable Lead Location: 753859
Implantable Lead Location: 753860
Implantable Lead Model: 4194
Implantable Lead Model: 5076
Implantable Lead Model: 6935
Implantable Pulse Generator Implant Date: 20160211
Lead Channel Impedance Value: 171 Ohm
Lead Channel Impedance Value: 399 Ohm
Lead Channel Impedance Value: 494 Ohm
Lead Channel Impedance Value: 570 Ohm
Lead Channel Impedance Value: 589 Ohm
Lead Channel Impedance Value: 703 Ohm
Lead Channel Pacing Threshold Amplitude: 0.5 V
Lead Channel Pacing Threshold Amplitude: 1 V
Lead Channel Pacing Threshold Amplitude: 1.5 V
Lead Channel Pacing Threshold Pulse Width: 0.4 ms
Lead Channel Pacing Threshold Pulse Width: 0.4 ms
Lead Channel Pacing Threshold Pulse Width: 0.4 ms
Lead Channel Sensing Intrinsic Amplitude: 1.25 mV
Lead Channel Sensing Intrinsic Amplitude: 1.25 mV
Lead Channel Sensing Intrinsic Amplitude: 20.25 mV
Lead Channel Sensing Intrinsic Amplitude: 20.25 mV
Lead Channel Setting Pacing Amplitude: 1.5 V
Lead Channel Setting Pacing Amplitude: 1.5 V
Lead Channel Setting Pacing Amplitude: 2.5 V
Lead Channel Setting Pacing Pulse Width: 0.4 ms
Lead Channel Setting Pacing Pulse Width: 0.4 ms
Lead Channel Setting Sensing Sensitivity: 0.3 mV

## 2020-07-05 NOTE — Progress Notes (Signed)
Remote ICD transmission.   

## 2020-07-13 ENCOUNTER — Other Ambulatory Visit (HOSPITAL_COMMUNITY): Payer: Self-pay | Admitting: Internal Medicine

## 2020-08-23 ENCOUNTER — Telehealth (HOSPITAL_COMMUNITY): Payer: Self-pay | Admitting: *Deleted

## 2020-08-23 NOTE — Telephone Encounter (Signed)
Pt left VM stating she is at her PCP and they want to send her to the hospital because her "blood count" is bad. Pt wants to know if she can come to our office today. I called pt back to get more information but pt did not answer I left vm requesting a return call.

## 2020-09-04 ENCOUNTER — Other Ambulatory Visit (HOSPITAL_COMMUNITY): Payer: Self-pay | Admitting: Internal Medicine

## 2020-09-12 NOTE — Progress Notes (Signed)
Patient ID: [name], female   DOB: [birth year], 65 y.o.   MRN: [id number]   ADVANCED HE CLINIC NOTE  PCP: Clinton Quant at [facility]  HPI:  [name] is a 65 y/o woman with history of breast CA in 1998 treated with Adriamycin x 4 cycles. In 2007, diagnosed with CHF with EF 25-35%. She is s/p Medtronic BiV-ICD in 2008 with Sprint Fidelis lead which was subsequently replaced. LV function has subsequently recovered  Had cath 12/10. Coronaries normal. Right atrial pressure mean of 7, RV pressure 28/6 . Pulmonary artery pressure was 23/9 with mean of 15.  Pulmonary capillary wedge pressure mean of 9. Fick cardiac output 3.9 L per minute.  Cardiac index 2.2 L per minute per meter squared.  Pulmonary vascular resistance was 1.5 [place] units.  [place] 10/19 RA = 11 RV = 27/5 PA = 24/3 (17) PCW = 9 Fick cardiac output/index = 4.8/2.5 PVR = 1.8 WU Ao sat = 98% PA sat = 66%, 67%  Echo 2/21 EF 50%-55% mild to moderate TR Personally reviewed  In 9/21 admitted to [place] with Covid (despite vaccination/boosting) was there for 6 days and got better. In 12/21 started having falls. SBP in 50s. Found to be septic with AKI.  BCx negative. Stopped all HF meds and diuretics. Gained 10 pounds. Two days ago, PCP restarted torsemide 60mg  daily (was on 60 bid previously + metolazone) and spiro 25 mg daily. Urine output has picked up greatly. Echo done EF 35-40% 67%  Follow up: Feels much better. Breathing better but still mildly orthopneic. Still edematous. No CP. No fevers or chills.   ICD interrogation:  Volume way up. No VT. 100% VP. Personally reviewed    Studies:  Echo 05/10/18 EF 40-45% Echo 11/17: EF 50-55%  Echo 5/11: EF 50-55%  12/15/11 ECHO EF 50%. RV normal. 12/16/11 ICD generator replaced due to EOL 04/2013 EF 50% 07/19/14 Echo EF 50% inferior HK GLS -17.8 6/16 Echo EF 45-50   ROS: All systems negative except as listed in HPI, PMH and Problem List.  Past Medical History:  Diagnosis Date   . AICD (automatic cardioverter/defibrillator) present   . Biventricular implantable cardiac defibrillator)    Medtronic  . Breast cancer ([place])    s/p mastectomy and chemo with adriamycin (1998)  . CHF (congestive heart failure) (HCC)    takes Lasix and Aldactone daily  . GERD (gastroesophageal reflux disease)    takes Omeprazole daily  . Gout    takes Allopurinol daily  . History of bronchitis 2 yrs ago  . History of shingles   . Hyperlipidemia    takes Simvastatin daily  . Insomnia    takes Ambien nightly  . Nonischemic cardiomyopathy ([place])    EF 55% echo 2012  . NSVT (nonsustained ventricular tachycardia) ([place])   . Obesity   . Pneumonia 27yrs ago  . Presence of permanent cardiac pacemaker   . Shortness of breath dyspnea    with exertion  . sprint Fidelis     Current Outpatient Medications  Medication Sig Dispense Refill  . albuterol (VENTOLIN HFA) 108 (90 Base) MCG/ACT inhaler Inhale 2 puffs into the lungs as needed.    Marland Kitchen alendronate (FOSAMAX) 70 MG tablet Take 70 mg by mouth once a week.     Marland Kitchen allopurinol (ZYLOPRIM) 100 MG tablet Take 100 mg by mouth daily.     . carvedilol (COREG) 12.5 MG tablet Take 1.5 tablets (18.75 mg total) by mouth 2 (two) times daily with a  meal. 270 tablet 3  . Cholecalciferol (VITAMIN D3) 5000 units TABS Take 5,000 Units by mouth daily.     . citalopram (CELEXA) 20 MG tablet Take 20 mg by mouth daily.     Marland Kitchen losartan (COZAAR) 50 MG tablet TAKE 1 TABLET EVERY DAY 90 tablet 3  . metolazone (ZAROXOLYN) 2.5 MG tablet TAKE 1 TABLET (2.5 MG TOTAL) BY MOUTH 2 (TWO) TIMES A WEEK. MONDAY AND FRIDAY 26 tablet 6  . multivitamin-iron-minerals-folic acid (THERAPEUTIC-M) TABS tablet Take 1 tablet by mouth daily.    Marland Kitchen omeprazole (PRILOSEC) 20 MG capsule Take 20 mg by mouth daily at 12 noon.     . potassium chloride SA (K-DUR) 20 MEQ tablet Take 40 mEq by mouth 2 (two) times daily.    . simvastatin (ZOCOR) 40 MG tablet TAKE 1 TABLET EVERY EVENING 90 tablet 1   . spironolactone (ALDACTONE) 25 MG tablet TAKE 1 TABLET DAILY  90 tablet 3  . torsemide (DEMADEX) 20 MG tablet Take 3 tablets (60 mg total) by mouth 2 (two) times daily. 540 tablet 3  . venlafaxine XR (EFFEXOR-XR) 150 MG 24 hr capsule Take 150 mg by mouth daily with breakfast.    . vitamin B-12 (CYANOCOBALAMIN) 100 MCG tablet Take 150 mcg by mouth daily.     Marland Kitchen zolpidem (AMBIEN) 5 MG tablet Take 5 mg by mouth at bedtime as needed. For sleep     No current facility-administered medications for this encounter.   PHYSICAL EXAM: Vitals:   09/13/20 1109  BP: 120/80  Pulse: 96  SpO2: 100%  Weight: 93.4 kg (206 lb)   Wt Readings from Last 3 Encounters:  09/13/20 93.4 kg (206 lb)  03/07/20 92.5 kg (204 lb)  10/11/19 96.3 kg (212 lb 6.4 oz)    General:  Well appearing. No resp difficulty HEENT: normal Neck: supple. JVP 10 Carotids 2+ bilat; no bruits. No lymphadenopathy or thryomegaly appreciated. Cor: PMI nondisplaced. Regular rate & rhythm. No rubs, gallops or murmurs. Lungs: clear Abdomen: obese soft, nontender, nondistended. No hepatosplenomegaly. No bruits or masses. Good bowel sounds. Extremities: no cyanosis, clubbing, rash, 2+ edema Neuro: alert & orientedx3, cranial nerves grossly intact. moves all 4 extremities w/o difficulty. 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 EF 50-55% on echo 11/17 - Echo 9/19 EF 45% with septal dyssynergy (despite biv pacing). - Echo 2/21 EF 50-55% mild to moderate TR - -RHC in 10/19 with relatively normal hemodynamics except for RA 10 (PCWP was 9)  - Echo 12/21 at [place] EF 35-40% in setting of sepsis - Stable NYHA III. Volume up in setting of cutting back diuretics/GDMT with recent sepsis. - Increase torsemide back to 60 bid - Continue spiro 25 daily - Restart losartan at 25 daily (was on 50) - Restart carvedilol  3.125 bid (was on 18.75 bid) - Restart potassium  - Will not restart metolazone yet.  - Repeat echo 3 months - Eventually can consider cardiomems for fluid management - F/u 1-2 weeks  2) Medtronic BiV ICD:  - ICD interrogation done in clinic: No VT/AF 100% BiV pacing. Marked fluid overload - Recent sepsis but BCx negative. No need for TEE unless recurrent infection   3) HTN - Blood pressure well controlled. Changes as above  4) Fatigue/snoring - Sleep study with moderate OSA. Following with Dr. Radford Pax. Unable to tolerate any CPAP. - BMI too high  for Inspire - Encourage weight loss  6) PAF (SCAF)  - 0.7% by ICD - This patients CHA2DS2-VASc 4 - D/w Dr. Caryl Comes given duration < 24 benefit of AC likely does not justify risk based on ASSERT data  Total time spent 45 minutes. Over half that time spent discussing above.    Glori Bickers MD  09/12/2020 9:00 PM

## 2020-09-13 ENCOUNTER — Encounter (HOSPITAL_COMMUNITY): Payer: Self-pay | Admitting: Internal Medicine

## 2020-09-13 ENCOUNTER — Telehealth (HOSPITAL_COMMUNITY): Payer: Self-pay | Admitting: *Deleted

## 2020-09-13 ENCOUNTER — Encounter (HOSPITAL_COMMUNITY): Payer: Self-pay

## 2020-09-13 ENCOUNTER — Ambulatory Visit (HOSPITAL_COMMUNITY)
Admission: RE | Admit: 2020-09-13 | Discharge: 2020-09-13 | Disposition: A | Discharge status: Home / Self Care | Payer: Medicare PPO | Source: Ambulatory Visit | Attending: Internal Medicine | Admitting: Internal Medicine

## 2020-09-13 ENCOUNTER — Other Ambulatory Visit: Payer: Self-pay

## 2020-09-13 VITALS — BP 120/80 | HR 96 | Wt 206.0 lb

## 2020-09-13 DIAGNOSIS — I428 Other cardiomyopathies: Secondary | ICD-10-CM | POA: Insufficient documentation

## 2020-09-13 DIAGNOSIS — Z79899 Other long term (current) drug therapy: Secondary | ICD-10-CM | POA: Insufficient documentation

## 2020-09-13 DIAGNOSIS — Z853 Personal history of malignant neoplasm of breast: Secondary | ICD-10-CM | POA: Insufficient documentation

## 2020-09-13 DIAGNOSIS — I110 Hypertensive heart disease with heart failure: Secondary | ICD-10-CM | POA: Diagnosis not present

## 2020-09-13 DIAGNOSIS — Z95810 Presence of automatic (implantable) cardiac defibrillator: Secondary | ICD-10-CM | POA: Insufficient documentation

## 2020-09-13 DIAGNOSIS — I480 Paroxysmal atrial fibrillation: Secondary | ICD-10-CM | POA: Insufficient documentation

## 2020-09-13 DIAGNOSIS — I5033 Acute on chronic diastolic (congestive) heart failure: Secondary | ICD-10-CM | POA: Diagnosis not present

## 2020-09-13 DIAGNOSIS — I10 Essential (primary) hypertension: Secondary | ICD-10-CM

## 2020-09-13 DIAGNOSIS — R0683 Snoring: Secondary | ICD-10-CM | POA: Diagnosis not present

## 2020-09-13 DIAGNOSIS — I5022 Chronic systolic (congestive) heart failure: Secondary | ICD-10-CM

## 2020-09-13 DIAGNOSIS — R5383 Other fatigue: Secondary | ICD-10-CM | POA: Insufficient documentation

## 2020-09-13 DIAGNOSIS — Z8616 Personal history of COVID-19: Secondary | ICD-10-CM | POA: Diagnosis not present

## 2020-09-13 DIAGNOSIS — I5023 Acute on chronic systolic (congestive) heart failure: Secondary | ICD-10-CM | POA: Diagnosis not present

## 2020-09-13 DIAGNOSIS — G4733 Obstructive sleep apnea (adult) (pediatric): Secondary | ICD-10-CM | POA: Diagnosis not present

## 2020-09-13 LAB — CBC
HCT: 33.6 % — ABNORMAL LOW (ref 36.0–46.0)
Hemoglobin: 10 g/dL — ABNORMAL LOW (ref 12.0–15.0)
MCH: 31.3 pg (ref 26.0–34.0)
MCHC: 29.8 g/dL — ABNORMAL LOW (ref 30.0–36.0)
MCV: 105.3 fL — ABNORMAL HIGH (ref 80.0–100.0)
Platelets: 284 10*3/uL (ref 150–400)
RBC: 3.19 MIL/uL — ABNORMAL LOW (ref 3.87–5.11)
RDW: 15.8 % — ABNORMAL HIGH (ref 11.5–15.5)
WBC: 5.3 10*3/uL (ref 4.0–10.5)
nRBC: 0 % (ref 0.0–0.2)

## 2020-09-13 LAB — BASIC METABOLIC PANEL
Anion gap: 12 (ref 5–15)
BUN: 10 mg/dL (ref 8–23)
CO2: 29 mmol/L (ref 22–32)
Calcium: 9.4 mg/dL (ref 8.9–10.3)
Chloride: 103 mmol/L (ref 98–111)
Creatinine, Ser: 1.34 mg/dL — ABNORMAL HIGH (ref 0.44–1.00)
GFR, Estimated: 44 mL/min — ABNORMAL LOW (ref >60)
Glucose, Bld: 124 mg/dL — ABNORMAL HIGH (ref 70–99)
Potassium: 3.1 mmol/L — ABNORMAL LOW (ref 3.5–5.1)
Sodium: 144 mmol/L (ref 135–145)

## 2020-09-13 MED ORDER — POTASSIUM CHLORIDE CRYS ER 20 MEQ PO TBCR: 20.0000 meq | EXTENDED_RELEASE_TABLET | Freq: Every day | ORAL | 3 refills | Status: DC

## 2020-09-13 MED ORDER — LOSARTAN POTASSIUM 25 MG PO TABS: 25.0000 mg | ORAL_TABLET | Freq: Every day | ORAL | 3 refills | Status: DC

## 2020-09-13 MED ORDER — CARVEDILOL 3.125 MG PO TABS: 3.1250 mg | ORAL_TABLET | Freq: Two times a day (BID) | ORAL | 3 refills | Status: DC

## 2020-09-13 MED ORDER — TORSEMIDE 20 MG PO TABS: 20.0000 mg | ORAL_TABLET | Freq: Every day | ORAL | 3 refills | Status: DC

## 2020-09-13 NOTE — Telephone Encounter (Signed)
[place], RN  09/13/2020 5:58 PM EST Back to Top     Spoke w/pt, she is aware, agreeable, and verbalized understanding, order placed for labs 1/12 at [facility] office

## 2020-09-13 NOTE — Telephone Encounter (Signed)
-----   Message from Jolaine Artist, MD sent at 09/13/2020  2:33 PM EST ----- Have her take potassium 19me extra today and then resume usual dosing. BMET next week

## 2020-09-13 NOTE — Patient Instructions (Signed)
Increase Torsemide to 60 mg ( 3 tablets)Twice daily  Restart potassium 49meq ( 2 tablets) Twice daily   Restart Carvedilol 3.125 mg Twice daily   Restart Losartan 25 mg daily  Labs done today, your results will be available in MyChart, we will contact you for abnormal readings.  Your physician recommends that you schedule a follow-up appointment in:  2 weeks  If you have any questions or concerns before your next appointment please send [place] a message through [place] or call our office at [phone].    TO LEAVE A MESSAGE FOR THE NURSE SELECT OPTION 2, PLEASE LEAVE A MESSAGE INCLUDING: . YOUR NAME . DATE OF BIRTH . CALL BACK NUMBER . REASON FOR CALL**this is important as we prioritize the call backs  [place] AS LONG AS YOU CALL BEFORE 4:00 PM   At the [facility], you and your health needs are our priority. As part of our continuing mission to provide you with exceptional heart care, we have created designated Provider Care Teams. These Care Teams include your primary Cardiologist (physician) and Advanced Practice Providers (APPs- Physician Assistants and Nurse Practitioners) who all work together to provide you with the care you need, when you need it.   You may see any of the following providers on your designated Care Team at your next follow up: Marland Kitchen Dr Glori Bickers . Dr Loralie Champagne . Darrick Grinder, NP . Lyda Jester, PA . Audry Riles, PharmD   Please be sure to bring in all your medications bottles to every appointment.

## 2020-09-17 ENCOUNTER — Encounter: Payer: Self-pay | Admitting: Cardiology

## 2020-09-17 DIAGNOSIS — G4733 Obstructive sleep apnea (adult) (pediatric): Secondary | ICD-10-CM | POA: Insufficient documentation

## 2020-09-17 NOTE — Progress Notes (Unsigned)
Date:  09/18/2020   ID:  [name], DOB 02-20-1956, MRN [id number]   Cardiologist: Glori Bickers, MD Sleep Medicine:  Fransico Him, MD Electrophysiologist:  None   Chief Complaint:  OSA  History of Present Illness:    [name] is a 65 y.o. female with a hx of Breast CA in 1998 tx with Adriamycin x 4 cycles and then dx CHF with 2007 with EF 25-35% s/p BiV-ICD.  She complained of non restorative sleep as well as daytime sleepiness at one of her OV and a sleep study was ordered.  She underwent Home sleep study which showed moderate OSA with an [facility] of 18.6/hr with no significant central sleep apnea and O2 sats as low as 81%.  She was placed on auto CPAP.    She had been having problems with pulling her mask off at night while she was asleep.  She was tolerating the pressure but was having problems with her mouth and had not been using it.  A full face mask was ordered but she was still taking her mask off at night in her sleep without her knowledge.  Since I saw her last she decided to had her device back in as she could not tolerate it.    Prior CV studies:   The following studies were reviewed today:  PAP compliance download  Past Medical History:  Diagnosis Date  . AICD (automatic cardioverter/defibrillator) present   . Biventricular implantable cardiac defibrillator)    Medtronic  . Breast cancer ([place])    s/p mastectomy and chemo with adriamycin (1998)  . CHF (congestive heart failure) (HCC)    takes Lasix and Aldactone daily  . GERD (gastroesophageal reflux disease)    takes Omeprazole daily  . Gout    takes Allopurinol daily  . History of bronchitis 2 yrs ago  . History of shingles   . Hyperlipidemia    takes Simvastatin daily  . Insomnia    takes Ambien nightly  . Nonischemic cardiomyopathy ([place])    EF 55% echo 2012  . NSVT (nonsustained ventricular tachycardia) ([place])   . Obesity   . OSA (obstructive sleep apnea)    moderate OSA with an AHi  of 18.6/hr with no significant central sleep apnea and O2 sats as low as 81%.  Now on auto CPAP.    Marland Kitchen Pneumonia 79yrs ago  . Presence of permanent cardiac pacemaker   . Shortness of breath dyspnea    with exertion  . sprint Fidelis    Past Surgical History:  Procedure Laterality Date  . ABDOMINAL HYSTERECTOMY    . APPENDECTOMY  1978  . BiV ICD  2007   Medtronic  . BIV ICD GENERTAOR CHANGE OUT N/A 12/16/2011   Procedure: BIV ICD GENERTAOR CHANGE OUT;  Surgeon: [name], MD;  Location: [facility] CATH LAB;  Service: Cardiovascular;  Laterality: N/A;  . COLONOSCOPY    . cyst removed from wrist Left    early 70's  . ICD LEAD REMOVAL Right 10/18/2014   Procedure: ICD LEAD REMOVAL/EXTRACTION/REIMPLANTATION;  Surgeon: Evans Lance, MD;  Location: [place];  Service: Cardiovascular;  Laterality: Right;  . MASTECTOMY Left 1998  . port a cath placed  1998  . port removed  1998  . RIGHT HEART CATH N/A 06/20/2018   Procedure: RIGHT HEART CATH;  Surgeon: Jolaine Artist, MD;  Location: [facility] CV LAB;  Service: Cardiovascular;  Laterality: N/A;     Current Meds  Medication Sig  .  albuterol (VENTOLIN HFA) 108 (90 Base) MCG/ACT inhaler Inhale 2 puffs into the lungs as needed.  Marland Kitchen alendronate (FOSAMAX) 70 MG tablet Take 70 mg by mouth once a week.   Marland Kitchen allopurinol (ZYLOPRIM) 100 MG tablet Take 100 mg by mouth daily.   . carvedilol (COREG) 3.125 MG tablet Take 1 tablet (3.125 mg total) by mouth 2 (two) times daily.  . Cholecalciferol (VITAMIN D3) 5000 units TABS Take 5,000 Units by mouth daily.  . citalopram (CELEXA) 20 MG tablet Take 20 mg by mouth daily.   Marland Kitchen losartan (COZAAR) 25 MG tablet Take 1 tablet (25 mg total) by mouth daily.  . Multiple Vitamins-Minerals (ONE DAILY FOR WOMEN 50+ ADV PO) Take by mouth.  . potassium chloride SA (KLOR-CON) 20 MEQ tablet Take 1 tablet (20 mEq total) by mouth daily.  . simvastatin (ZOCOR) 40 MG tablet TAKE 1 TABLET EVERY EVENING  . spironolactone  (ALDACTONE) 25 MG tablet TAKE 1 TABLET DAILY   . torsemide (DEMADEX) 20 MG tablet Take 1 tablet (20 mg total) by mouth daily.  Marland Kitchen venlafaxine XR (EFFEXOR-XR) 150 MG 24 hr capsule Take 150 mg by mouth daily with breakfast.  . vitamin B-12 (CYANOCOBALAMIN) 100 MCG tablet Take 150 mcg by mouth daily.   Marland Kitchen zolpidem (AMBIEN) 5 MG tablet Take 5 mg by mouth at bedtime as needed. For sleep     Allergies:   Buprenorphine hcl, Morphine and related, Percocet [oxycodone-acetaminophen], and Oxycodone   Social History   Tobacco Use  . Smoking status: Never Smoker  . Smokeless tobacco: Never Used  Vaping Use  . Vaping Use: Never used  Substance Use Topics  . Alcohol use: No  . Drug use: No     Family Hx: The patient's family history includes Alzheimer's disease in her father; Diabetes in her mother; Heart disease in her father.  ROS:   Please see the history of present illness.     All other systems reviewed and are negative.   Labs/Other Tests and Data Reviewed:    Recent Labs: 10/11/2019: B Natriuretic Peptide 229.4 09/13/2020: BUN 10; Creatinine, Ser 1.34; Hemoglobin 10.0; Platelets 284; Potassium 3.1; Sodium 144   Recent Lipid Panel Lab Results  Component Value Date/Time   CHOL 133 08/01/2014 10:57 AM   TRIG 94 08/01/2014 10:57 AM   HDL 43 08/01/2014 10:57 AM   CHOLHDL 3.1 08/01/2014 10:57 AM   LDLCALC 71 08/01/2014 10:57 AM   LDLDIRECT 147.2 01/15/2009 09:41 AM    Wt Readings from Last 3 Encounters:  09/18/20 198 lb 9.6 oz (90.1 kg)  09/13/20 206 lb (93.4 kg)  03/07/20 204 lb (92.5 kg)     Objective:    Vital Signs:  BP 104/63   Pulse 84   Ht 5\' 2"  (1.575 m)   Wt 198 lb 9.6 oz (90.1 kg)   SpO2 90%   BMI 36.32 kg/m    GEN: Well nourished, well developed in no acute distress HEENT: Normal NECK: No JVD; No carotid bruits LYMPHATICS: No lymphadenopathy CARDIAC:RRR, no murmurs, rubs, gallops RESPIRATORY:  Clear to auscultation without rales, wheezing or rhonchi   ABDOMEN: Soft, non-tender, non-distended MUSCULOSKELETAL:  No edema; No deformity  SKIN: Warm and dry NEUROLOGIC:  Alert and oriented x 3 PSYCHIATRIC:  Normal affect    ASSESSMENT & PLAN:    1.  OSA   -she has tried the nasal mask which she pulls off as much as the FFM -I discussed the risks related to untreated OSA at length with  her today including increased risk of CVA, CHF, arrhythmias, pulmonary HTN at her last OV and I encouraged her to keep trying with her device -at last OV I discussed with her the Inspire device (hypoglossal nerve stimulator) which might be a good option for her but unfortunately she is over the weight limit -since she cannot tolerate the PAP therapy I will refer her to ENT for evaluation.  She carries her weight mainly in her lower abdomen and hips so she may be a candidate for the hypoglossal nerve stimulator.  2.  HTN -BP  -continue Carvedilol 12.5mg  BID, Losartan 50mg  daily and spiro 25mg  daily  3. Morbid  Obesity -BMI >35 and < 40 with co morbidities -I discussed the Healthy Weight and Wellness program with her but she lives far away and is not interested  Medication Adjustments/Labs and Tests Ordered: Current medicines are reviewed at length with the patient today.  Concerns regarding medicines are outlined above.  Tests Ordered: Orders Placed This Encounter  Procedures  . Ambulatory referral to ENT   Medication Changes: No orders of the defined types were placed in this encounter.   Disposition:  Follow up in 3 month(s)  Signed, Fransico Him, MD  09/18/2020 9:33 AM    [facility]

## 2020-09-18 ENCOUNTER — Other Ambulatory Visit: Payer: Self-pay

## 2020-09-18 ENCOUNTER — Ambulatory Visit: Payer: Medicare PPO | Admitting: Cardiology

## 2020-09-18 ENCOUNTER — Telehealth: Payer: Self-pay | Admitting: Emergency Medicine

## 2020-09-18 ENCOUNTER — Encounter: Payer: Self-pay | Admitting: Cardiology

## 2020-09-18 ENCOUNTER — Other Ambulatory Visit: Payer: Medicare PPO

## 2020-09-18 ENCOUNTER — Ambulatory Visit (INDEPENDENT_AMBULATORY_CARE_PROVIDER_SITE_OTHER): Payer: Medicare PPO | Admitting: Emergency Medicine

## 2020-09-18 VITALS — BP 104/63 | HR 84 | Ht 62.0 in | Wt 198.6 lb

## 2020-09-18 DIAGNOSIS — G4733 Obstructive sleep apnea (adult) (pediatric): Secondary | ICD-10-CM

## 2020-09-18 DIAGNOSIS — E669 Obesity, unspecified: Secondary | ICD-10-CM | POA: Diagnosis not present

## 2020-09-18 DIAGNOSIS — I428 Other cardiomyopathies: Secondary | ICD-10-CM | POA: Diagnosis not present

## 2020-09-18 DIAGNOSIS — Z95810 Presence of automatic (implantable) cardiac defibrillator: Secondary | ICD-10-CM | POA: Diagnosis not present

## 2020-09-18 DIAGNOSIS — I10 Essential (primary) hypertension: Secondary | ICD-10-CM

## 2020-09-18 DIAGNOSIS — I5022 Chronic systolic (congestive) heart failure: Secondary | ICD-10-CM

## 2020-09-18 LAB — BASIC METABOLIC PANEL
BUN/Creatinine Ratio: 10 — ABNORMAL LOW (ref 12–28)
BUN: 17 mg/dL (ref 8–27)
CO2: 28 mmol/L (ref 20–29)
Calcium: 10.1 mg/dL (ref 8.7–10.3)
Chloride: 100 mmol/L (ref 96–106)
Creatinine, Ser: 1.68 mg/dL — ABNORMAL HIGH (ref 0.57–1.00)
GFR calc Af Amer: 36 mL/min/{1.73_m2} — ABNORMAL LOW (ref >59)
GFR calc non Af Amer: 31 mL/min/{1.73_m2} — ABNORMAL LOW (ref >59)
Glucose: 108 mg/dL — ABNORMAL HIGH (ref 65–99)
Potassium: 4.6 mmol/L (ref 3.5–5.2)
Sodium: 142 mmol/L (ref 134–144)

## 2020-09-18 NOTE — Patient Instructions (Signed)
Medication Instructions:  Your physician recommends that you continue on your current medications as directed. Please refer to the Current Medication list given to you today.  *If you need a refill on your cardiac medications before your next appointment, please call your pharmacy*  Follow-Up: At [facility], you and your health needs are our priority.  As part of our continuing mission to provide you with exceptional heart care, we have created designated Provider Care Teams.  These Care Teams include your primary Cardiologist (physician) and Advanced Practice Providers (APPs -  Physician Assistants and Nurse Practitioners) who all work together to provide you with the care you need, when you need it.  Follow up with Dr. Radford Pax as needed.    Other Instructions You have been referred to see an Ear, Nose, and Throat Specialist, Dr. Redmond Baseman

## 2020-09-18 NOTE — Telephone Encounter (Signed)
Patient had CRT-D check today that showed Optivol elevation and decreased thoracic impedance.

## 2020-09-18 NOTE — Progress Notes (Signed)
CRT-D device check in office due to patient request. Patient appointment with Dr Radford Pax.Thresholds and sensing consistent with previous device measurements. Lead impedance trends stable over time. No mode switch episodes recorded. No ventricular arrhythmia episodes recorded. Patient bi-ventricularly pacing 99.4% of the time. Device programmed with appropriate safety margins. Heart failure diagnostics reviewed and trends are showing elevation of Optivol. Audible/vibratory alerts demonstrated for patient. No changes made this session. Estimated longevity 1 year 10 months.  Patient enrolled in remote follow up and next remote 10/01/20. Plan to check device remotely in 3 months and see in office in 6 months. Patient education completed including shock plan.

## 2020-09-23 NOTE — Telephone Encounter (Signed)
Referred to ICM clinic by Jodi Geralds, RN Device clinic.  Spoke with patient and ICM referral intro given.  Advised patient reason for call was Optivol showing abnormal last week.  Dr Haroldine Laws address fluid accumulation at 09/13/2020 OV and increased Torsemide to 60 mg bid which she confirmed she has been taking.  Patient was recently in hospital which correlates with decreased thoracic impedance.  Attempted to assist to send remote transmission for review today but monitor gave error code.  Provided Med[employer] Support number and encouraged her to call.  Patient agreed to monthly ICM call. She has HF clinic appointment on 09/26/2020.  Patient will call back after she receives assistance from [place] to send report.

## 2020-09-24 NOTE — Telephone Encounter (Signed)
Call to patient.  She reports she has attempted calling [employer] several times but wait time is long. She said she did try to trouble shoot by going the their website as instructed in a recording by unable to get the monitor to work. She thinks it may be the battery.   Advised I will [employer] to check if I can get a rep to call her or order a new monitor.

## 2020-09-24 NOTE — Telephone Encounter (Signed)
Spoke with [employer] customer rep.  He is going to send patient updated reader that will be a 4G that should take care of her problem.  She should receive 7-10 days.

## 2020-09-24 NOTE — Telephone Encounter (Signed)
Spoke with patient.  Advised [employer] rep is sending a new reader for her monitor which should arrive in 7-10 days.  Advised when she gets the new reader to send in remote transmission and the device clinic will review if needed.

## 2020-09-25 NOTE — Progress Notes (Addendum)
ADVANCED HF CLINIC NOTE  PCP: Clinton Quant at [facility] Cardiologist: Dr. Fransico Him HF cardiologist: Dr. Haroldine Laws  HPI:  [name] is a 65 y/o woman with history of breast CA in 1998 treated with Adriamycin x 4 cycles. In 2007, diagnosed with CHF with EF 25-35%. She is s/p Medtronic BiV-ICD in 2008 with Sprint Fidelis lead which was subsequently replaced. LV function has subsequently recovered  Had cath 12/10. Coronaries normal. Right atrial pressure mean of 7, RV pressure 28/6 . Pulmonary artery pressure was 23/9 with mean of 15.  Pulmonary capillary wedge pressure mean of 9. Fick cardiac output 3.9 L per minute.  Cardiac index 2.2 L per minute per meter squared.  Pulmonary vascular resistance was 1.5 Woods units.  [place] 10/19 RA = 11 RV = 27/5 PA = 24/3 (17) PCW = 9 Fick cardiac output/index = 4.8/2.5 PVR = 1.8 WU Ao sat = 98% PA sat = 66%, 67%  Echo 2/21 EF 50%-55% mild to moderate TR. Personally reviewed  In 9/21 admitted to [place] with Covid (despite vaccination/boosting) was there for 6 days and got better. In 12/21 started having falls. SBP in 50s. Found to be septic with AKI.  BCx negative. Stopped all HF meds and diuretics. Gained 10 pounds. Two days ago, PCP restarted torsemide 60 mg daily (was on 60 bid previously + metolazone) and spiro 25 mg daily. Urine output has picked up greatly. Echo done EF 35-40%  Seen for HF follow 09/13/20 and felt much better. Breathing better but still mildly orthopneic. Still edematous. Volume overloaded by ICD interrogation. Torsemide was increased to 60 mg bid, losartan and carvedilol restarted.   Today she returns for HF follow up. Down 11 lbs since last visit. Overall not feeling great. Still SOB with activity, more so with stairs and inclines. Still some LE edema, but better after starting torsemide bid. Denies increasing SOB, CP, dizziness, or PND/Orthopnea. Appetite ok. No fever or chills. Weight at home ~ 187  pounds. Taking all medications.   Studies: Echo 12/21: EF 35-40% (in setting of sepsis in [place]). Echo 2/21: EF 50%-55% mild to moderate TR Echo 9/19: EF 40-45% Echo 11/17: EF 50-55%  Echo 6/16: EF 45-50% Echo 11/15: EF 50% inferior HK GLS -17.8 Echo 8/14: EF 50% Echo 5/11: EF 50-55%  Echo 4/13 EF 50%. RV normal.  12/16/11 ICD generator replaced due to EOL  ROS: All systems negative except as listed in HPI, PMH and Problem List.  Past Medical History:  Diagnosis Date  . AICD (automatic cardioverter/defibrillator) present   . Biventricular implantable cardiac defibrillator)    Medtronic  . Breast cancer ([place])    s/p mastectomy and chemo with adriamycin (1998)  . CHF (congestive heart failure) (HCC)    takes Lasix and Aldactone daily  . GERD (gastroesophageal reflux disease)    takes Omeprazole daily  . Gout    takes Allopurinol daily  . History of bronchitis 2 yrs ago  . History of shingles   . Hyperlipidemia    takes Simvastatin daily  . Insomnia    takes Ambien nightly  . Nonischemic cardiomyopathy ([place])    EF 55% echo 2012  . NSVT (nonsustained ventricular tachycardia) ([place])   . Obesity   . OSA (obstructive sleep apnea)    moderate OSA with an AHi of 18.6/hr with no significant central sleep apnea and O2 sats as low as 81%.  Now on  auto CPAP.    Marland Kitchen Pneumonia 20yrs ago  . Presence of permanent cardiac pacemaker   . Shortness of breath dyspnea    with exertion  . sprint Fidelis     Current Outpatient Medications  Medication Sig Dispense Refill  . albuterol (VENTOLIN HFA) 108 (90 Base) MCG/ACT inhaler Inhale 2 puffs into the lungs as needed.    Marland Kitchen alendronate (FOSAMAX) 70 MG tablet Take 70 mg by mouth once a week.     Marland Kitchen allopurinol (ZYLOPRIM) 100 MG tablet Take 100 mg by mouth daily.     . carvedilol (COREG) 3.125 MG tablet Take 1 tablet (3.125 mg total) by mouth 2 (two) times daily. 180 tablet 3  . Cholecalciferol (VITAMIN D3) 5000 units TABS Take 5,000 Units by  mouth daily.    . citalopram (CELEXA) 20 MG tablet Take 20 mg by mouth daily.     Marland Kitchen losartan (COZAAR) 25 MG tablet Take 1 tablet (25 mg total) by mouth daily. 90 tablet 3  . Multiple Vitamins-Minerals (ONE DAILY FOR WOMEN 50+ ADV PO) Take by mouth.    . potassium chloride SA (KLOR-CON) 20 MEQ tablet Take 1 tablet (20 mEq total) by mouth daily. 90 tablet 3  . simvastatin (ZOCOR) 40 MG tablet TAKE 1 TABLET EVERY EVENING 90 tablet 1  . spironolactone (ALDACTONE) 25 MG tablet TAKE 1 TABLET DAILY  90 tablet 3  . torsemide (DEMADEX) 20 MG tablet Take 1 tablet (20 mg total) by mouth daily. 96 tablet 3  . venlafaxine XR (EFFEXOR-XR) 150 MG 24 hr capsule Take 150 mg by mouth daily with breakfast.    . vitamin B-12 (CYANOCOBALAMIN) 100 MCG tablet Take 150 mcg by mouth daily.     Marland Kitchen zolpidem (AMBIEN) 5 MG tablet Take 5 mg by mouth at bedtime as needed. For sleep     No current facility-administered medications for this encounter.    Vitals:   09/26/20 1156  BP: 124/90  Pulse: 88  SpO2: 96%  Weight: 88.6 kg   Wt Readings from Last 3 Encounters:  09/26/20 88.6 kg  09/18/20 90.1 kg  09/13/20 93.4 kg   PHYSICAL EXAM: General:  NAD. No resp difficulty HEENT: Normal Neck: Supple. JVP~7-8. Carotids 2+ bilat; no bruits. No lymphadenopathy or thryomegaly appreciated. Cor: PMI nondisplaced. Regular rate & rhythm. No rubs, gallops or murmurs. Lungs: Clear Abdomen: Obese, soft, nontender, nondistended. No hepatosplenomegaly. No bruits or masses. Good bowel sounds. Extremities: No cyanosis, clubbing, rash, 1+ LE edema Neuro: alert & oriented x 3, cranial nerves grossly intact. Moves all 4 extremities w/o difficulty. Affect pleasant.  Device Interrogation: Volume still up, but thoracic impedence just below threshold. No VT. Patient activity 2-3 hrs/day. 100% VP. (Personally reviewed).  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 EF 50-55% on echo 11/17 -  Echo 9/19 EF 45% with septal dyssynergy (despite biv pacing). - Echo 2/21 EF 50-55% mild to moderate TR - RHC in 10/19 with relatively normal hemodynamics except for RA 10 (PCWP was 9)  - Echo 12/21 at [place] EF 35-40% in setting of sepsis. - Stable NYHA III. Volume still up, although weight down 11 lbs - Continue torsemide 60 mg bid + 20 mEq of KCl daily (will not increase further as she had been stable on this dose in the past with twice a week metolazone). - Restart metolazone 2.5 mg weekly on Thursdays + extra 40 mEq of KCl on metolazone days (was previously on metolazone twice a  week). - Continue spiro 25 mg daily. - Continue losartan 25 mg daily (was on 50). - Continue carvedilol 3.125 mg bid (was on 18.75 bid). - May consider addition of SLGT2i in future.  - Repeat echo 3 months (4/22). - Eventually can consider cardiomems for fluid management. - BMET today.  2) Medtronic BiV ICD:  - ICD interrogation done in clinic: No VT/AF 100% BiV pacing. Marked fluid overload. - Recent sepsis but BCx negative. No need for TEE unless recurrent infection.   3) HTN - Blood pressure well controlled.  - Continue current regimen.  4) Fatigue/snoring - Sleep study with moderate OSA. Following with Dr. Radford Pax. Unable to tolerate any CPAP. - BMI too high for Inspire. - Encouraged weight loss.  5) PAF (SCAF)  - 0.7% by ICD - This patients CHA2DS2-VASc 4 - D/w Dr. Caryl Comes given duration < 24 benefit of AC likely does not justify risk based on ASSERT data  6) Obesity Body mass index is 35.74 kg/m. - Encouraged portion control & low-carb dietary choices.   Follow up in APP clinic 3-4 weeks to reassess volume status and further medication titration.  Maricela Bo [facility] FNP-BC 09/26/2020 12:46 PM

## 2020-09-26 ENCOUNTER — Other Ambulatory Visit (HOSPITAL_COMMUNITY): Payer: Self-pay | Admitting: Family Medicine

## 2020-09-26 ENCOUNTER — Other Ambulatory Visit: Payer: Self-pay

## 2020-09-26 ENCOUNTER — Ambulatory Visit (HOSPITAL_COMMUNITY)
Admission: RE | Admit: 2020-09-26 | Discharge: 2020-09-26 | Disposition: A | Discharge status: Home / Self Care | Payer: Medicare PPO | Source: Ambulatory Visit | Attending: Family Medicine | Admitting: Family Medicine

## 2020-09-26 ENCOUNTER — Encounter (HOSPITAL_COMMUNITY): Payer: Self-pay

## 2020-09-26 VITALS — BP 124/90 | HR 88 | Wt 195.4 lb

## 2020-09-26 DIAGNOSIS — I5022 Chronic systolic (congestive) heart failure: Secondary | ICD-10-CM

## 2020-09-26 DIAGNOSIS — I480 Paroxysmal atrial fibrillation: Secondary | ICD-10-CM | POA: Diagnosis not present

## 2020-09-26 DIAGNOSIS — Z79899 Other long term (current) drug therapy: Secondary | ICD-10-CM | POA: Diagnosis not present

## 2020-09-26 DIAGNOSIS — I5032 Chronic diastolic (congestive) heart failure: Secondary | ICD-10-CM | POA: Diagnosis present

## 2020-09-26 DIAGNOSIS — G4733 Obstructive sleep apnea (adult) (pediatric): Secondary | ICD-10-CM | POA: Insufficient documentation

## 2020-09-26 DIAGNOSIS — E669 Obesity, unspecified: Secondary | ICD-10-CM | POA: Insufficient documentation

## 2020-09-26 DIAGNOSIS — I10 Essential (primary) hypertension: Secondary | ICD-10-CM | POA: Diagnosis not present

## 2020-09-26 DIAGNOSIS — I110 Hypertensive heart disease with heart failure: Secondary | ICD-10-CM | POA: Diagnosis not present

## 2020-09-26 DIAGNOSIS — Z95810 Presence of automatic (implantable) cardiac defibrillator: Secondary | ICD-10-CM | POA: Insufficient documentation

## 2020-09-26 DIAGNOSIS — Z8619 Personal history of other infectious and parasitic diseases: Secondary | ICD-10-CM | POA: Insufficient documentation

## 2020-09-26 DIAGNOSIS — Z853 Personal history of malignant neoplasm of breast: Secondary | ICD-10-CM | POA: Diagnosis not present

## 2020-09-26 DIAGNOSIS — Z6835 Body mass index (BMI) 35.0-35.9, adult: Secondary | ICD-10-CM | POA: Insufficient documentation

## 2020-09-26 LAB — BASIC METABOLIC PANEL
Anion gap: 10 (ref 5–15)
BUN: 17 mg/dL (ref 8–23)
CO2: 29 mmol/L (ref 22–32)
Calcium: 10.3 mg/dL (ref 8.9–10.3)
Chloride: 100 mmol/L (ref 98–111)
Creatinine, Ser: 1.61 mg/dL — ABNORMAL HIGH (ref 0.44–1.00)
GFR, Estimated: 35 mL/min — ABNORMAL LOW (ref >60)
Glucose, Bld: 110 mg/dL — ABNORMAL HIGH (ref 70–99)
Potassium: 4.1 mmol/L (ref 3.5–5.1)
Sodium: 139 mmol/L (ref 135–145)

## 2020-09-26 MED ORDER — POTASSIUM CHLORIDE CRYS ER 20 MEQ PO TBCR: EXTENDED_RELEASE_TABLET | ORAL | 3 refills | Status: DC

## 2020-09-26 MED ORDER — METOLAZONE 2.5 MG PO TABS: 2.5000 mg | ORAL_TABLET | ORAL | 3 refills | Status: DC

## 2020-09-26 NOTE — Patient Instructions (Signed)
START Metolazone 2.5 mg, one tan every week on Thursday With every dose of metolazone take an ADDITIONAL 76meq of potassium  Labs today We will only contact you if something comes back abnormal or we need to make some changes. Otherwise no news is good news!  Labs needed in one week  Your physician recommends that you schedule a follow-up appointment in: 3-4 weeks  If you have any questions or concerns before your next appointment please send [place] a message through [place] or call our office at [phone].    TO LEAVE A MESSAGE FOR THE NURSE SELECT OPTION 2, PLEASE LEAVE A MESSAGE INCLUDING: . YOUR NAME . DATE OF BIRTH . CALL BACK NUMBER . REASON FOR CALL**this is important as we prioritize the call backs  YOU WILL RECEIVE A CALL BACK THE SAME DAY AS LONG AS YOU CALL BEFORE 4:00 PM

## 2020-10-01 ENCOUNTER — Ambulatory Visit (INDEPENDENT_AMBULATORY_CARE_PROVIDER_SITE_OTHER): Payer: Medicare PPO

## 2020-10-01 DIAGNOSIS — I428 Other cardiomyopathies: Secondary | ICD-10-CM

## 2020-10-01 DIAGNOSIS — I5022 Chronic systolic (congestive) heart failure: Secondary | ICD-10-CM | POA: Diagnosis not present

## 2020-10-01 LAB — CUP PACEART REMOTE DEVICE CHECK
Battery Remaining Longevity: 20 mo
Battery Voltage: 2.9 V
Brady Statistic AP VP Percent: 2.83 %
Brady Statistic AP VS Percent: 0.01 %
Brady Statistic AS VP Percent: 97.15 %
Brady Statistic AS VS Percent: 0.02 %
Brady Statistic RA Percent Paced: 2.84 %
Brady Statistic RV Percent Paced: 99.92 %
Date Time Interrogation Session: 20220125033327
HighPow Impedance: 81 Ohm
Implantable Lead Implant Date: 20070821
Implantable Lead Implant Date: 20070821
Implantable Lead Implant Date: 20160211
Implantable Lead Location: 753858
Implantable Lead Location: 753859
Implantable Lead Location: 753860
Implantable Lead Model: 4194
Implantable Lead Model: 5076
Implantable Lead Model: 6935
Implantable Pulse Generator Implant Date: 20160211
Lead Channel Impedance Value: 171 Ohm
Lead Channel Impedance Value: 399 Ohm
Lead Channel Impedance Value: 456 Ohm
Lead Channel Impedance Value: 513 Ohm
Lead Channel Impedance Value: 570 Ohm
Lead Channel Impedance Value: 703 Ohm
Lead Channel Pacing Threshold Amplitude: 0.5 V
Lead Channel Pacing Threshold Amplitude: 1.125 V
Lead Channel Pacing Threshold Amplitude: 2.125 V
Lead Channel Pacing Threshold Pulse Width: 0.4 ms
Lead Channel Pacing Threshold Pulse Width: 0.4 ms
Lead Channel Pacing Threshold Pulse Width: 0.4 ms
Lead Channel Sensing Intrinsic Amplitude: 2.5 mV
Lead Channel Sensing Intrinsic Amplitude: 2.5 mV
Lead Channel Sensing Intrinsic Amplitude: 21.75 mV
Lead Channel Sensing Intrinsic Amplitude: 21.75 mV
Lead Channel Setting Pacing Amplitude: 1.5 V
Lead Channel Setting Pacing Amplitude: 1.75 V
Lead Channel Setting Pacing Amplitude: 3.25 V
Lead Channel Setting Pacing Pulse Width: 0.4 ms
Lead Channel Setting Pacing Pulse Width: 0.4 ms
Lead Channel Setting Sensing Sensitivity: 0.3 mV

## 2020-10-14 ENCOUNTER — Encounter (HOSPITAL_COMMUNITY): Payer: Self-pay

## 2020-10-14 NOTE — Progress Notes (Signed)
Remote ICD transmission.   

## 2020-10-16 NOTE — Progress Notes (Signed)
ADVANCED HF CLINIC NOTE  PCP: Clinton Quant at [place] [facility] Cardiologist: Dr. Fransico Him HF cardiologist: Dr. Haroldine Laws  HPI: [name] is a 65 y/o woman with history of breast CA in 1998 treated with Adriamycin x 4 cycles. In 2007, diagnosed with CHF with EF 25-35%. She is s/p Medtronic BiV-ICD in 2008 with Sprint Fidelis lead which was subsequently replaced. LV function has subsequently recovered  Had cath 12/10. Coronaries normal. Right atrial pressure mean of 7, RV pressure 28/6 . Pulmonary artery pressure was 23/9 with mean of 15.  Pulmonary capillary wedge pressure mean of 9. Fick cardiac output 3.9 L per minute.  Cardiac index 2.2 L per minute per meter squared.  Pulmonary vascular resistance was 1.5 Woods units.  [place] 2019  RA = 11 RV = 27/5 PA = 24/3 (17) PCW = 9 Fick cardiac output/index = 4.8/2.5 PVR = 1.8 WU Ao sat = 98% PA sat = 66%, 67%  Echo 2/21 EF 50%-55% mild to moderate TR. Personally reviewed  In 9/21 admitted to [place] with Covid (despite vaccination/boosting) was there for 6 days and got better. In 12/21 started having falls. SBP in 50s. Found to be septic with AKI.  BCx negative. Stopped all HF meds and diuretics. Gained 10 pounds.  Echo done EF 35-40%  Seen for HF follow 09/13/20. Volume overloaded by ICD interrogation. Torsemide was increased to 60 mg bid, losartan and carvedilol restarted.   Today she returns for HF follow up. Last visit metolazone was started weekly. Overall feeling fine. SOB inclines/steps. Denies PND/Orthopnea. She tried CPAP but cant tolerate the mask.  Appetite ok. No fever or chills. Weight at home 197 pounds. Taking all medications. Taking metolazone once a week.   Lab work  10/16/20: Creatinine up 2.2  09/26/20: Creatinine 1.6  09/18/20: 1.68 09/13/20: 1.3   Studies: Echo 12/21: EF 35-40% (in setting of sepsis in [place]). Echo 2/21: EF 50%-55% mild to moderate TR Echo 9/19: EF 40-45% Echo 11/17: EF 50-55%  Echo  6/16: EF 45-50% Echo 11/15: EF 50% inferior HK GLS -17.8 Echo 8/14: EF 50% Echo 5/11: EF 50-55%  Echo 4/13 EF 50%. RV normal.  12/16/11 ICD generator replaced due to EOL  ROS: All systems negative except as listed in HPI, PMH and Problem List.  Past Medical History:  Diagnosis Date  . AICD (automatic cardioverter/defibrillator) present   . Biventricular implantable cardiac defibrillator)    Medtronic  . Breast cancer ([place])    s/p mastectomy and chemo with adriamycin (1998)  . CHF (congestive heart failure) (HCC)    takes Lasix and Aldactone daily  . GERD (gastroesophageal reflux disease)    takes Omeprazole daily  . Gout    takes Allopurinol daily  . History of bronchitis 2 yrs ago  . History of shingles   . Hyperlipidemia    takes Simvastatin daily  . Insomnia    takes Ambien nightly  . Nonischemic cardiomyopathy ([place])    EF 55% echo 2012  . NSVT (nonsustained ventricular tachycardia) ([place])   . Obesity   . OSA (obstructive sleep apnea)    moderate OSA with an AHi of 18.6/hr with no significant central sleep apnea and O2 sats as low as 81%.  Now on auto CPAP.    Marland Kitchen Pneumonia 8yrs ago  . Presence of permanent cardiac pacemaker   . Shortness of breath dyspnea    with exertion  . sprint  Fidelis     Current Outpatient Medications  Medication Sig Dispense Refill  . albuterol (VENTOLIN HFA) 108 (90 Base) MCG/ACT inhaler Inhale 2 puffs into the lungs as needed.    Marland Kitchen alendronate (FOSAMAX) 70 MG tablet Take 70 mg by mouth once a week.     Marland Kitchen allopurinol (ZYLOPRIM) 100 MG tablet Take 100 mg by mouth daily.     . carvedilol (COREG) 3.125 MG tablet Take 1 tablet (3.125 mg total) by mouth 2 (two) times daily. 180 tablet 3  . Cholecalciferol (VITAMIN D3) 5000 units TABS Take 5,000 Units by mouth daily.    . citalopram (CELEXA) 20 MG tablet Take 20 mg by mouth daily.     Marland Kitchen losartan (COZAAR) 25 MG tablet Take 1 tablet (25 mg total) by mouth daily. 90 tablet 3  . metolazone  (ZAROXOLYN) 2.5 MG tablet Take 1 tablet (2.5 mg total) by mouth once a week. thursdays 4 tablet 3  . Multiple Vitamins-Minerals (ONE DAILY FOR WOMEN 50+ ADV PO) Take by mouth.    . potassium chloride SA (KLOR-CON M20) 20 MEQ tablet TAKE 1 TABLET DAILY BY MOUTH WITH ADDITIONAL 40MEQ WITH EVERY DOSE OF METOLAZONE 115 tablet 3  . simvastatin (ZOCOR) 40 MG tablet TAKE 1 TABLET EVERY EVENING 90 tablet 1  . spironolactone (ALDACTONE) 25 MG tablet TAKE 1 TABLET DAILY  90 tablet 3  . torsemide (DEMADEX) 20 MG tablet Take 1 tablet (20 mg total) by mouth daily. 96 tablet 3  . venlafaxine XR (EFFEXOR-XR) 150 MG 24 hr capsule Take 150 mg by mouth daily with breakfast.    . vitamin B-12 (CYANOCOBALAMIN) 100 MCG tablet Take 150 mcg by mouth daily.     Marland Kitchen zolpidem (AMBIEN) 5 MG tablet Take 5 mg by mouth at bedtime as needed. For sleep     No current facility-administered medications for this encounter.    Vitals:   10/17/20 0857  BP: 100/60  Pulse: 78  SpO2: 100%  Weight: 91.2 kg (201 lb)   Wt Readings from Last 3 Encounters:  10/17/20 91.2 kg (201 lb)  09/26/20 88.6 kg (195 lb 6.4 oz)  09/18/20 90.1 kg (198 lb 9.6 oz)   PHYSICAL EXAM: General:  Well appearing. No resp difficulty. [place] HEENT: normal Neck: supple. no JVD. Carotids 2+ bilat; no bruits. No lymphadenopathy or thryomegaly appreciated. Cor: PMI nondisplaced. Regular rate & rhythm. No rubs, gallops or murmurs. Lungs: clear Abdomen: soft, nontender, nondistended. No hepatosplenomegaly. No bruits or masses. Good bowel sounds. Extremities: no cyanosis, clubbing, rash, edema Neuro: alert & orientedx3, cranial nerves grossly intact. moves all 4 extremities w/o difficulty. Affect pleasant  Device Interrogation: Fluid index well below threshold. Impedance up. No VT. Activity ~ 3 hours per day.   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 EF 50-55% on echo 11/17 - Echo 9/19 EF 45% with septal  dyssynergy (despite biv pacing). - Echo 2/21 EF 50-55% mild to moderate TR - RHC in 10/19 with relatively normal hemodynamics except for RA 10 (PCWP was 9)  - Echo 12/21 at [place] EF 35-40% in setting of sepsis. -NYHA III. Volume status improved. Appears dry. Creatinine trending up. Repeat BMET next week.  - Instructed to hold torsemide/potassium tonight. Tomorrow she will restart  Torsemide/potassium at current dose.   - I have asked her to change metolazone to as needed for weight 202 or greater.  - Continue spiro 25 mg daily. - Continue losartan 25 mg daily (was on  50). - Continue carvedilol 3.125 mg bid (was on 18.75 bid). - May consider addition of SLGT2i in future. Consider at next office visit. If this is started will need to back off torsemide dose (20 mg 3 days a week).  - Repeat echo 3 months (4/22).  2) Medtronic BiV ICD:  - ICD interrogation. Followed at the device clinic.  - Septic 05/2020 BCx negative.   3) HTN - Stable   4) Fatigue/snoring - Sleep study with moderate OSA. Following with Dr. Radford Pax. Unable to tolerate any CPAP. - Dr Radford Pax referred to ENT for [facility] Device.   5) PAF (SCAF)  - 0.7% by ICD - This patients CHA2DS2-VASc 4 - D/w Dr. Caryl Comes given duration < 24 benefit of AC likely does not justify risk based on ASSERT data  6) Obesity Body mass index is 36.76 kg/m. - Discussed portion control.   7) AKI -Creatinine baseline 1.4-1.6 -Creatinine up to 2.0 -Holding diuretics as above. Repeat BMET in 7 days.  -Discussed importance of avoiding NSAIDs.    I reviewed BMET from PCP.   Follow up in 2 weeks to reassess volume status. Consider SGT2i at that time.  Follow up with Dr Haroldine Laws in 2 months with an ECHO.   Elika Godar NP-C 10/17/2020 9:06 AM

## 2020-10-17 ENCOUNTER — Encounter (HOSPITAL_COMMUNITY): Payer: Self-pay

## 2020-10-17 ENCOUNTER — Other Ambulatory Visit: Payer: Self-pay

## 2020-10-17 ENCOUNTER — Ambulatory Visit (HOSPITAL_COMMUNITY)
Admission: RE | Admit: 2020-10-17 | Discharge: 2020-10-17 | Disposition: A | Discharge status: Home / Self Care | Payer: Medicare PPO | Source: Ambulatory Visit | Attending: Adult Health | Admitting: Adult Health

## 2020-10-17 VITALS — BP 100/60 | HR 78 | Wt 201.0 lb

## 2020-10-17 DIAGNOSIS — I480 Paroxysmal atrial fibrillation: Secondary | ICD-10-CM

## 2020-10-17 DIAGNOSIS — N179 Acute kidney failure, unspecified: Secondary | ICD-10-CM | POA: Diagnosis not present

## 2020-10-17 DIAGNOSIS — Z6836 Body mass index (BMI) 36.0-36.9, adult: Secondary | ICD-10-CM | POA: Diagnosis not present

## 2020-10-17 DIAGNOSIS — Z95810 Presence of automatic (implantable) cardiac defibrillator: Secondary | ICD-10-CM | POA: Diagnosis not present

## 2020-10-17 DIAGNOSIS — I110 Hypertensive heart disease with heart failure: Secondary | ICD-10-CM | POA: Diagnosis not present

## 2020-10-17 DIAGNOSIS — I5032 Chronic diastolic (congestive) heart failure: Secondary | ICD-10-CM | POA: Diagnosis not present

## 2020-10-17 DIAGNOSIS — Z7983 Long term (current) use of bisphosphonates: Secondary | ICD-10-CM | POA: Insufficient documentation

## 2020-10-17 DIAGNOSIS — I428 Other cardiomyopathies: Secondary | ICD-10-CM

## 2020-10-17 DIAGNOSIS — Z79899 Other long term (current) drug therapy: Secondary | ICD-10-CM | POA: Diagnosis not present

## 2020-10-17 DIAGNOSIS — G4733 Obstructive sleep apnea (adult) (pediatric): Secondary | ICD-10-CM

## 2020-10-17 DIAGNOSIS — Z853 Personal history of malignant neoplasm of breast: Secondary | ICD-10-CM | POA: Insufficient documentation

## 2020-10-17 DIAGNOSIS — I5022 Chronic systolic (congestive) heart failure: Secondary | ICD-10-CM

## 2020-10-17 DIAGNOSIS — E669 Obesity, unspecified: Secondary | ICD-10-CM | POA: Insufficient documentation

## 2020-10-17 MED ORDER — METOLAZONE 2.5 MG PO TABS: 2.5000 mg | ORAL_TABLET | ORAL | 3 refills | Status: DC

## 2020-10-17 NOTE — Patient Instructions (Addendum)
HOLD Torsemide and Potassium 10/18/20 CHANGE Metolazone to as needed once a week for weights greater than 202 lbs  Labs needed in 7-10 days  Your physician recommends that you schedule a follow-up appointment in: 2 weeks  in the Advanced Practitioners (PA/NP) Clinic   Your physician recommends that you schedule a follow-up appointment in: 8 weeks with Dr Haroldine Laws and echo  Your physician has requested that you have an echocardiogram. Echocardiography is a painless test that uses sound waves to create images of your heart. It provides your doctor with information about the size and shape of your heart and how well your heart's chambers and valves are working. This procedure takes approximately one hour. There are no restrictions for this procedure.   Do the following things EVERYDAY: 1) Weigh yourself in the morning before breakfast. Write it down and keep it in a log. 2) Take your medicines as prescribed 3) Eat low salt foods--Limit salt (sodium) to 2000 mg per day.  4) Stay as active as you can everyday 5) Limit all fluids for the day to less than 2 liters  At the [facility], you and your health needs are our priority. As part of our continuing mission to provide you with exceptional heart care, we have created designated Provider Care Teams. These Care Teams include your primary Cardiologist (physician) and Advanced Practice Providers (APPs- Physician Assistants and Nurse Practitioners) who all work together to provide you with the care you need, when you need it.   You may see any of the following providers on your designated Care Team at your next follow up: Marland Kitchen Dr Glori Bickers . Dr Loralie Champagne . Darrick Grinder, NP . Lyda Jester, [place] . Audry Riles, PharmD   Please be sure to bring in all your medications bottles to every appointment.    If you have any questions or concerns before your next appointment please send [place] a message through  [place] or call our office at [phone].    TO LEAVE A MESSAGE FOR THE NURSE SELECT OPTION 2, PLEASE LEAVE A MESSAGE INCLUDING: . YOUR NAME . DATE OF BIRTH . CALL BACK NUMBER . REASON FOR CALL**this is important as we prioritize the call backs  YOU WILL RECEIVE A CALL BACK THE SAME DAY AS LONG AS YOU CALL BEFORE 4:00 PM

## 2020-10-18 NOTE — Addendum Note (Signed)
Encounter addended by: Kerry Dory, CMA on: 10/18/2020 11:24 AM  Actions taken: Order list changed, Diagnosis association updated

## 2020-10-19 ENCOUNTER — Other Ambulatory Visit: Payer: Self-pay | Admitting: Internal Medicine

## 2020-10-19 ENCOUNTER — Other Ambulatory Visit (HOSPITAL_COMMUNITY): Payer: Self-pay | Admitting: Internal Medicine

## 2020-10-21 ENCOUNTER — Ambulatory Visit (INDEPENDENT_AMBULATORY_CARE_PROVIDER_SITE_OTHER): Payer: Medicare PPO

## 2020-10-21 DIAGNOSIS — Z95810 Presence of automatic (implantable) cardiac defibrillator: Secondary | ICD-10-CM

## 2020-10-21 DIAGNOSIS — I5022 Chronic systolic (congestive) heart failure: Secondary | ICD-10-CM

## 2020-10-22 ENCOUNTER — Telehealth: Payer: Self-pay

## 2020-10-22 NOTE — Telephone Encounter (Signed)
Remote ICM transmission received.  Attempted call to patient regarding ICM remote transmission and left message to return call   

## 2020-10-22 NOTE — Progress Notes (Signed)
EPIC Encounter for ICM Monitoring  Patient Name: [name] is a 65 y.o. female Date: 10/22/2020 Primary Care Physican: Sueanne Margarita, MD Primary Cardiologist: [place] Electrophysiologist: Vergie Living Pacing: 99.9%  10/08/2020 Office Weight: 210 lbs   Time in AT/AF <0.1 hr/day (<0.1%)       1st ICM remote transmission follow up.  Attempted call to patient and unable to reach.  Left message to return call. Transmission reviewed.    Optivol thoracic impedance suggesting possible fluid accumulation starting 10/17/20 but starting to trend back toward baseline.  Metolazone was changed on 2/10 from weekly to PRN.  Prescribed:  Torsemide 20 mg take 1 tablet daily.  Metolazone 2.5 mg take 1 tablet (2.5 mg total) once a week, Thursdays for weights greater than 202 lbs.   Potassium 20 mEq take 1 tablet daily with additional 40 mEq with every Metolazone dose  Spironolactone 25 mg take 1 tablet daily.  Labs: 09/26/2020 Creatinine 1.61, BUN 17, Potassium 4.1, Sodium 139 09/18/2020 Creatinine 1.68, BUN 17, Potassium 4.6, Sodium 142, GFR 31-36  09/13/2020 Creatinine 1.34, BUN 10, Potassium 3.1, Sodium 144  A complete set of results can be found in Results Review.  Recommendations: Unable to reach.    Follow-up plan: ICM clinic phone appointment on 11/04/2020 to recheck fluid levels.   91 day device clinic remote transmission 12/31/2020.    EP/Cardiology Office Visits: 10/31/2020 with [facility] PA/NP.  12/12/2020 with Dr Haroldine Laws  Copy of ICM check sent to Dr. Caryl Comes and Dr Haroldine Laws.   3 month ICM trend: 10/21/2020.    1 Year ICM trend:       Rosalene Billings, RN 10/22/2020 3:50 PM

## 2020-10-23 ENCOUNTER — Other Ambulatory Visit (HOSPITAL_COMMUNITY): Payer: Self-pay | Admitting: Internal Medicine

## 2020-10-29 ENCOUNTER — Other Ambulatory Visit (HOSPITAL_COMMUNITY): Payer: Self-pay | Admitting: Adult Health

## 2020-10-30 LAB — BASIC METABOLIC PANEL
BUN/Creatinine Ratio: 14 (ref 12–28)
BUN: 21 mg/dL (ref 8–27)
CO2: 25 mmol/L (ref 20–29)
Calcium: 9.9 mg/dL (ref 8.7–10.3)
Chloride: 98 mmol/L (ref 96–106)
Creatinine, Ser: 1.45 mg/dL — ABNORMAL HIGH (ref 0.57–1.00)
GFR calc Af Amer: 43 mL/min/{1.73_m2} — ABNORMAL LOW (ref >59)
GFR calc non Af Amer: 38 mL/min/{1.73_m2} — ABNORMAL LOW (ref >59)
Glucose: 102 mg/dL — ABNORMAL HIGH (ref 65–99)
Potassium: 4.3 mmol/L (ref 3.5–5.2)
Sodium: 139 mmol/L (ref 134–144)

## 2020-10-30 NOTE — Progress Notes (Signed)
ADVANCED HF CLINIC NOTE  PCP: Clinton Quant at [facility] Cardiologist: Dr. Fransico Him HF cardiologist: Dr. Haroldine Laws  HPI:  [name] is a 65 y/o woman with history of breast CA in 1998 treated with Adriamycin x 4 cycles. In 2007, diagnosed with CHF with EF 25-35%. She is s/p Medtronic BiV-ICD in 2008 with Sprint Fidelis lead which was subsequently replaced. LV function has subsequently recovered  Had cath 12/10. Coronaries normal. Right atrial pressure mean of 7, RV pressure 28/6 . Pulmonary artery pressure was 23/9 with mean of 15.  Pulmonary capillary wedge pressure mean of 9. Fick cardiac output 3.9 L per minute.  Cardiac index 2.2 L per minute per meter squared.  Pulmonary vascular resistance was 1.5 [place] units.  [place] 10/19 RA = 11 RV = 27/5 PA = 24/3 (17) PCW = 9 Fick cardiac output/index = 4.8/2.5 PVR = 1.8 WU Ao sat = 98% PA sat = 66%, 67%  Echo 2/21 EF 50%-55% mild to moderate TR. Personally reviewed  In 9/21 admitted to [place] with Covid (despite vaccination/boosting) was there for 6 days and got better. In 12/21 started having falls. SBP in 50s. Found to be septic with AKI.  BCx negative. Stopped all HF meds and diuretics. Gained 10 pounds. Two days ago, PCP restarted torsemide 60 mg daily (was on 60 bid previously + metolazone) and spiro 25 mg daily. Urine output has picked up greatly. Echo done EF 35-40%.  Seen for HF follow 09/13/20 and felt much better. Breathing better but still mildly orthopneic. Still edematous. Volume overloaded by ICD interrogation. Torsemide was increased to 60 mg bid, losartan and carvedilol restarted.   Today she returns for HF follow up. Last visit weekly metolazone was changed to PRN as her volume was low.Today,  overall feeling fine. Breathing is at baseline, SOB with stairs or walking fast. Denies increasing SOB, CP, dizziness, edema, or PND/Orthopnea. Appetite ok. No fever or chills. Weight at home ~197 pounds. Taking all  medications. Took metolazone once this past week for weight gain.  Studies: Echo 12/21: EF 35-40% (in setting of sepsis in [place]). Echo 2/21: EF 50%-55% mild to moderate TR Echo 9/19: EF 40-45% Echo 11/17: EF 50-55%  Echo 6/16: EF 45-50% Echo 11/15: EF 50% inferior HK GLS -17.8 Echo 8/14: EF 50% Echo 5/11: EF 50-55%  Echo 4/13 EF 50%. RV normal.  12/16/11 ICD generator replaced due to EOL  ROS: All systems negative except as listed in HPI, PMH and Problem List.  Past Medical History:  Diagnosis Date  . AICD (automatic cardioverter/defibrillator) present   . Biventricular implantable cardiac defibrillator)    Medtronic  . Breast cancer ([place])    s/p mastectomy and chemo with adriamycin (1998)  . CHF (congestive heart failure) ([facility])    takes Lasix and Aldactone daily  . GERD (gastroesophageal reflux disease)    takes Omeprazole daily  . Gout    takes Allopurinol daily  . History of bronchitis 2 yrs ago  . History of shingles   . Hyperlipidemia    takes Simvastatin daily  . Insomnia    takes Ambien nightly  . Nonischemic cardiomyopathy ([place])    EF 55% echo 2012  . NSVT (nonsustained ventricular tachycardia) ([place])   . Obesity   . OSA (obstructive sleep apnea)    moderate OSA with an AHi of 18.6/hr with no significant central sleep apnea and O2 sats as low  as 81%.  Now on auto CPAP.    Marland Kitchen Pneumonia 68yrs ago  . Presence of permanent cardiac pacemaker   . Shortness of breath dyspnea    with exertion  . sprint Fidelis     Current Outpatient Medications  Medication Sig Dispense Refill  . albuterol (VENTOLIN HFA) 108 (90 Base) MCG/ACT inhaler Inhale 2 puffs into the lungs as needed.    Marland Kitchen alendronate (FOSAMAX) 70 MG tablet Take 70 mg by mouth once a week.     Marland Kitchen allopurinol (ZYLOPRIM) 100 MG tablet Take 100 mg by mouth daily.     Marland Kitchen atorvastatin (LIPITOR) 10 MG tablet Take 10 mg by mouth at bedtime.    . carvedilol (COREG) 3.125 MG tablet Take 1 tablet (3.125 mg total) by  mouth 2 (two) times daily. 180 tablet 3  . Cholecalciferol (VITAMIN D3) 5000 units TABS Take 5,000 Units by mouth daily.    . citalopram (CELEXA) 20 MG tablet Take 20 mg by mouth daily.     . empagliflozin (JARDIANCE) 10 MG TABS tablet Take 1 tablet (10 mg total) by mouth daily before breakfast. 30 tablet 11  . losartan (COZAAR) 25 MG tablet Take 1 tablet (25 mg total) by mouth daily. 90 tablet 3  . metolazone (ZAROXOLYN) 2.5 MG tablet Take 2.5 mg by mouth as needed. Take 1 tablet if weight gain 3 lbs overnight    . Multiple Vitamins-Minerals (ONE DAILY FOR WOMEN 50+ ADV PO) Take by mouth.    . potassium chloride SA (KLOR-CON M20) 20 MEQ tablet TAKE 1 TABLET DAILY BY MOUTH WITH ADDITIONAL 40MEQ WITH EVERY DOSE OF METOLAZONE 115 tablet 3  . simvastatin (ZOCOR) 40 MG tablet TAKE 1 TABLET EVERY EVENING 90 tablet 1  . spironolactone (ALDACTONE) 25 MG tablet TAKE 1 TABLET EVERY DAY 90 tablet 1  . torsemide (DEMADEX) 20 MG tablet Take 1 tablet (20 mg total) by mouth daily. (Patient taking differently: Take 60 mg by mouth 2 (two) times daily.) 96 tablet 3  . venlafaxine XR (EFFEXOR-XR) 150 MG 24 hr capsule Take 150 mg by mouth daily with breakfast.    . vitamin B-12 (CYANOCOBALAMIN) 100 MCG tablet Take 150 mcg by mouth daily.     Marland Kitchen zolpidem (AMBIEN) 5 MG tablet Take 5 mg by mouth at bedtime as needed. For sleep     No current facility-administered medications for this encounter.    Vitals:   10/31/20 0906  BP: 110/70  Pulse: 78  SpO2: 98%  Weight: 91.9 kg (202 lb 9.6 oz)   Wt Readings from Last 3 Encounters:  10/31/20 91.9 kg (202 lb 9.6 oz)  10/17/20 91.2 kg (201 lb)  09/26/20 88.6 kg (195 lb 6.4 oz)   PHYSICAL EXAM: General:  NAD. No resp difficulty HEENT: Normal Neck: Supple. No JVD. Carotids 2+ bilat; no bruits. No lymphadenopathy or thryomegaly appreciated. Cor: PMI nondisplaced. Regular rate & rhythm. No rubs, gallops or murmurs. Lungs: Clear Abdomen: Obese, soft, nontender,  nondistended. No hepatosplenomegaly. No bruits or masses. Good bowel sounds. Extremities: No cyanosis, clubbing, rash, edema Neuro: alert & oriented x 3, cranial nerves grossly intact. Moves all 4 extremities w/o difficulty. Affect pleasant.  Device Interrogation: OptiVol trending above threshold, but thoracic impedence just below threshold. 3 episodes of AF (lasting 2-3 minutes) over past week, No VT, 100% VP. Patient activity 2-3 hrs/day (Personally reviewed).  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 EF 50-55% on echo 11/17 - Echo 9/19 EF 45%  with septal dyssynergy (despite biv pacing). - Echo 2/21 EF 50-55% mild to moderate TR - RHC in 10/19 with relatively normal hemodynamics except for RA 10 (PCWP was 9)  - Echo 12/21 at [place] EF 35-40% in setting of sepsis. - Stable NYHA III. Volume stable on exam, though appears to be creeping up on OptiVol.  - Start Jardiance 10 mg daily (insurance preference). - Continue torsemide 60 mg bid + 20 mEq of KCl daily. May be able to back down on daily torsemide with initiation of SGLT2i, will await next OptiVol reading. - Continue metolazone 2.5 mg prn weight gain (was previously on metolazone twice a week) - Continue spiro 25 mg daily. - Continue losartan 25 mg daily (was on 50). - Continue carvedilol 3.125 mg bid (was on 18.75 bid). - Repeat echo 3 months (4/22). - Eventually can consider cardiomems for fluid management. - BMET yesterday stable. Repeat 7-10 days. - Will ask device rep to do another interrogation next week to ensure fluid does not continue to increase.  2) Medtronic BiV ICD:  - ICD interrogation done in clinic: No VT/AF 100% BiV pacing.  - Recent sepsis but BCx negative. No need for TEE unless recurrent infection.   3) HTN - Blood pressure well controlled.  - Continue current regimen.  4) OSA, moderate - Following with Dr. Radford Pax. Unable to tolerate any CPAP. - BMI too high  for Inspire. - Encouraged weight loss.  5) PAF (SCAF) - Regular on exam today. - This patients CHA2DS2-VASc 4. - D/w Dr. Caryl Comes given duration < 24 benefit of AC likely does not justify risk based on ASSERT data.  6) Obesity Body mass index is 37.06 kg/m. - Encouraged portion control & low-carb dietary choices.   - Will reach out to device rep to complete another interrogation next week to ensure fluid leveling off, if still trending up, will likely need to resume scheduled weekly metolazone.  - Follow up with Dr. Haroldine Laws with echo as scheduled.  [facility] FNP-BC 10/31/2020 12:51 PM

## 2020-10-31 ENCOUNTER — Ambulatory Visit (HOSPITAL_COMMUNITY)
Admission: RE | Admit: 2020-10-31 | Discharge: 2020-10-31 | Disposition: A | Discharge status: Home / Self Care | Payer: Medicare PPO | Source: Ambulatory Visit | Attending: Family Medicine | Admitting: Family Medicine

## 2020-10-31 ENCOUNTER — Encounter (HOSPITAL_COMMUNITY): Payer: Self-pay

## 2020-10-31 ENCOUNTER — Other Ambulatory Visit: Payer: Self-pay

## 2020-10-31 VITALS — BP 110/70 | HR 78 | Wt 202.6 lb

## 2020-10-31 DIAGNOSIS — Z95810 Presence of automatic (implantable) cardiac defibrillator: Secondary | ICD-10-CM | POA: Diagnosis not present

## 2020-10-31 DIAGNOSIS — Z6837 Body mass index (BMI) 37.0-37.9, adult: Secondary | ICD-10-CM | POA: Diagnosis not present

## 2020-10-31 DIAGNOSIS — I480 Paroxysmal atrial fibrillation: Secondary | ICD-10-CM | POA: Insufficient documentation

## 2020-10-31 DIAGNOSIS — I110 Hypertensive heart disease with heart failure: Secondary | ICD-10-CM | POA: Insufficient documentation

## 2020-10-31 DIAGNOSIS — Z7984 Long term (current) use of oral hypoglycemic drugs: Secondary | ICD-10-CM | POA: Diagnosis not present

## 2020-10-31 DIAGNOSIS — E669 Obesity, unspecified: Secondary | ICD-10-CM | POA: Insufficient documentation

## 2020-10-31 DIAGNOSIS — Z79899 Other long term (current) drug therapy: Secondary | ICD-10-CM | POA: Insufficient documentation

## 2020-10-31 DIAGNOSIS — R0602 Shortness of breath: Secondary | ICD-10-CM | POA: Diagnosis not present

## 2020-10-31 DIAGNOSIS — I5032 Chronic diastolic (congestive) heart failure: Secondary | ICD-10-CM | POA: Diagnosis not present

## 2020-10-31 DIAGNOSIS — G4733 Obstructive sleep apnea (adult) (pediatric): Secondary | ICD-10-CM | POA: Insufficient documentation

## 2020-10-31 DIAGNOSIS — I10 Essential (primary) hypertension: Secondary | ICD-10-CM

## 2020-10-31 MED ORDER — EMPAGLIFLOZIN 10 MG PO TABS: 10.0000 mg | ORAL_TABLET | Freq: Every day | ORAL | 11 refills | Status: DC

## 2020-10-31 MED ORDER — DAPAGLIFLOZIN PROPANEDIOL 10 MG PO TABS: 10.0000 mg | ORAL_TABLET | Freq: Every day | ORAL | 11 refills | Status: DC

## 2020-10-31 NOTE — Progress Notes (Signed)
Per pharmacy team Insurance prefers jardiance($47/mth) over [place] ($100/mth) Medication changed

## 2020-10-31 NOTE — Patient Instructions (Addendum)
START Farxiga 10 mg, one tab daily  Labs needed in 7-10 days  Keep follow up as scheduled  Do the following things EVERYDAY: 1) Weigh yourself in the morning before breakfast. Write it down and keep it in a log. 2) Take your medicines as prescribed 3) Eat low salt foods-Limit salt (sodium) to 2000 mg per day.  4) Stay as active as you can everyday 5) Limit all fluids for the day to less than 2 liters  At the [facility], you and your health needs are our priority. As part of our continuing mission to provide you with exceptional heart care, we have created designated Provider Care Teams. These Care Teams include your primary Cardiologist (physician) and Advanced Practice Providers (APPs- Physician Assistants and Nurse Practitioners) who all work together to provide you with the care you need, when you need it.   You may see any of the following providers on your designated Care Team at your next follow up: Marland Kitchen Dr Glori Bickers . Dr Loralie Champagne . Dr Vickki Muff . Darrick Grinder, NP . Lyda Jester, [place] . Audry Riles, PharmD   Please be sure to bring in all your medications bottles to every appointment.   If you have any questions or concerns before your next appointment please send [place] a message through [place] or call our office at [phone].    TO LEAVE A MESSAGE FOR THE NURSE SELECT OPTION 2, PLEASE LEAVE A MESSAGE INCLUDING: . YOUR NAME . DATE OF BIRTH . CALL BACK NUMBER . REASON FOR CALL**this is important as we prioritize the call backs  YOU WILL RECEIVE A CALL BACK THE SAME DAY AS LONG AS YOU CALL BEFORE 4:00 PM

## 2020-11-04 ENCOUNTER — Ambulatory Visit (INDEPENDENT_AMBULATORY_CARE_PROVIDER_SITE_OTHER): Payer: Medicare PPO

## 2020-11-04 DIAGNOSIS — Z95810 Presence of automatic (implantable) cardiac defibrillator: Secondary | ICD-10-CM

## 2020-11-04 DIAGNOSIS — I5032 Chronic diastolic (congestive) heart failure: Secondary | ICD-10-CM

## 2020-11-04 NOTE — Progress Notes (Signed)
[place], Maricela Bo, FNP  [place] Emel Panda, RN Thank you Delmy Holdren,  Let's check in 2 more weeks, please.  Thanks!    ICM fluid recheck scheduled for 11/18/2020

## 2020-11-04 NOTE — Progress Notes (Signed)
EPIC Encounter for ICM Monitoring  Patient Name: [name] is a 65 y.o. female Date: 11/04/2020 Primary Care Physican: Sueanne Margarita, MD Primary Cardiologist: [place] Electrophysiologist: Vergie Living Pacing: 99.9%         11/04/2020 Home Weight: 191 lbs   Time in AT/AF  <0.1 hr/day (<0.1%)                                                           Spoke with patient and reports feeling well at this time.  Denies fluid symptoms.    She started Jardiance that was prescribed at the 10/31/2020 OV.  She has not taken any Metolazone since 10/31/2020 OV with Juanita Craver. NP.   Optivol thoracic impedance suggesting possible fluid accumulation continues and no change since 10/31/2020 OV  Prescribed:  Torsemide 20 mg take 60 mg twice a day.  Metolazone 2.5 mg Take 2.5 mg by mouth as needed. Take 1 tablet if weight gain 3 lbs overnight  Potassium 20 mEq take 1 tablet daily with additional 40 mEq with every Metolazone dose  Spironolactone 25 mg take 1 tablet daily.  Labs: 10/29/2020 Creatinine 1.45, BUN 21, Potassium 4.3, Sodium 139, GFR 38-43 09/26/2020 Creatinine 1.61, BUN 17, Potassium 4.1, Sodium 139 09/18/2020 Creatinine 1.68, BUN 17, Potassium 4.6, Sodium 142, GFR 31-36  09/13/2020 Creatinine 1.34, BUN 10, Potassium 3.1, Sodium 144  A complete set of results can be found in Results Review.  Recommendations:   Copy sent to Allena Katz, NP at her request to recheck fluid levels following 10/31/20 OV.  Follow-up plan: ICM clinic phone appointment on 11/11/2020 to recheck fluid levels.   91 day device clinic remote transmission 12/31/2020.    EP/Cardiology Office Visits:   12/12/2020 with Dr Haroldine Laws  Copy of ICM check sent to Dr. Caryl Comes and Allena Katz, NP.   3 month ICM trend: 11/04/2020.    1 Year ICM trend:       Rosalene Billings, RN 11/04/2020 11:29 AM

## 2020-11-12 ENCOUNTER — Other Ambulatory Visit (HOSPITAL_COMMUNITY): Payer: Self-pay | Admitting: Adult Health

## 2020-11-13 LAB — BASIC METABOLIC PANEL
BUN/Creatinine Ratio: 22 (ref 12–28)
BUN: 38 mg/dL — ABNORMAL HIGH (ref 8–27)
CO2: 23 mmol/L (ref 20–29)
Calcium: 9.4 mg/dL (ref 8.7–10.3)
Chloride: 94 mmol/L — ABNORMAL LOW (ref 96–106)
Creatinine, Ser: 1.71 mg/dL — ABNORMAL HIGH (ref 0.57–1.00)
Glucose: 116 mg/dL — ABNORMAL HIGH (ref 65–99)
Potassium: 3.2 mmol/L — ABNORMAL LOW (ref 3.5–5.2)
Sodium: 141 mmol/L (ref 134–144)
eGFR: 33 mL/min/{1.73_m2} — ABNORMAL LOW (ref >59)

## 2020-11-18 ENCOUNTER — Ambulatory Visit (INDEPENDENT_AMBULATORY_CARE_PROVIDER_SITE_OTHER): Payer: Medicare PPO

## 2020-11-18 DIAGNOSIS — Z95810 Presence of automatic (implantable) cardiac defibrillator: Secondary | ICD-10-CM

## 2020-11-18 DIAGNOSIS — I5032 Chronic diastolic (congestive) heart failure: Secondary | ICD-10-CM

## 2020-11-19 ENCOUNTER — Telehealth: Payer: Self-pay

## 2020-11-19 NOTE — Progress Notes (Signed)
EPIC Encounter for ICM Monitoring  Patient Name: [name] is a 65 y.o. female Date: 11/19/2020 Primary Care Physican: Sueanne Margarita, MD Primary Cardiologist:[place] Electrophysiologist:[place] Bi-V Pacing:99.6% 2/28/2022Home Weight:191lbs   Time in AT/AF<0.1 hr/day (<0.1%)   Attempted call to patient and unable to reach.  Left detailed message per DPR regarding transmission. Transmission reviewed.   Optivol thoracic impedance suggesting fluid levels returned to normal.  Prescribed:  Torsemide20 mg take 60 mg twice a day.  Metolazone 2.5 mg Take 2.5 mg by mouth as needed. Take 1 tablet if weight gain 3 lbs overnight  Potassium 20 mEq take 1 tablet daily with additional 40 mEq with every Metolazone dose  Spironolactone 25 mg take 1 tablet daily.  Labs: 11/12/2020 Creatinine 1.71, BUN 38, Potassium 3.2, Sodium 141, GFR 33 10/29/2020 Creatinine 1.45, BUN 21, Potassium 4.3, Sodium 139, GFR 38-43 09/26/2020 Creatinine1.61, BUN17, Potassium4.1, Sodium139 09/18/2020 Creatinine1.68, BUN17, Potassium4.6, Sodium142, [id number]  09/13/2020 Creatinine1.34, BUN10, Potassium3.1, [id number] A complete set of results can be found in Results Review.  Recommendations: Unable to reach.    Follow-up plan: ICM clinic phone appointment on3/30/2022. 91 day device clinic remote transmission 12/31/2020.   EP/Cardiology Office Visits:12/12/2020 with Dr Haroldine Laws  Copy of ICM check sent to [place].  3 month ICM trend: 11/18/2020.    1 Year ICM trend:       Rosalene Billings, RN 11/19/2020 4:38 PM

## 2020-11-19 NOTE — Telephone Encounter (Signed)
Remote ICM transmission received.  Attempted call to patient regarding ICM remote transmission and left detailed message per DPR.  Advised to return call for any fluid symptoms or questions.  

## 2020-11-20 ENCOUNTER — Other Ambulatory Visit (HOSPITAL_COMMUNITY): Payer: Self-pay | Admitting: Internal Medicine

## 2020-11-20 ENCOUNTER — Telehealth: Payer: Self-pay | Admitting: *Deleted

## 2020-11-20 NOTE — Telephone Encounter (Signed)
Patient states she will not wear the cpap so there no use in her getting it. She said it will only be a waste of time.

## 2020-11-20 NOTE — Telephone Encounter (Signed)
-----   Message from Sueanne Margarita, MD sent at 11/20/2020 11:46 AM EDT ----- Please call patient and tell them that I received the letter from Dr. Wilburn Cornelia with ENT who feels patient is not a candidate for [facility] device.  Please find out if she has gone back on CPAP - I would like an in person appt with DME to discuss issues with masks and followup with me in 3 months

## 2020-12-04 ENCOUNTER — Ambulatory Visit (INDEPENDENT_AMBULATORY_CARE_PROVIDER_SITE_OTHER): Payer: Medicare PPO

## 2020-12-04 DIAGNOSIS — Z95810 Presence of automatic (implantable) cardiac defibrillator: Secondary | ICD-10-CM | POA: Diagnosis not present

## 2020-12-04 DIAGNOSIS — I5032 Chronic diastolic (congestive) heart failure: Secondary | ICD-10-CM | POA: Diagnosis not present

## 2020-12-06 ENCOUNTER — Telehealth: Payer: Self-pay

## 2020-12-06 NOTE — Telephone Encounter (Signed)
Remote ICM transmission received.  Attempted call to patient regarding ICM remote transmission and left detailed message per DPR.  Advised to return call for any fluid symptoms or questions. Next ICM remote transmission scheduled 01/06/2021.     

## 2020-12-06 NOTE — Progress Notes (Signed)
EPIC Encounter for ICM Monitoring  Patient Name: [name] is a 65 y.o. female Date: 12/06/2020 Primary Care Physican: Sueanne Margarita, MD Primary Cardiologist: [place] Electrophysiologist: Vergie Living Pacing: 99.7%         11/04/2020 Home Weight: 191 lbs    Time in AT/AF  <0.1 hr/day (<0.1%)                                                            Attempted call to patient and unable to reach.  Left detailed message per DPR regarding transmission. Transmission reviewed.    Optivol thoracic impedance suggesting possible fluid accumulation starting 11/25/2020 and returning to baseline 3/30.   Prescribed: Torsemide 20 mg take 60 mg twice a day. Metolazone 2.5 mg Take 2.5 mg by mouth as needed. Take 1 tablet if weight gain 3 lbs overnight Potassium 20 mEq take 1 tablet daily with additional 40 mEq with every Metolazone dose Spironolactone 25 mg take 1 tablet daily.   Labs: 11/12/2020 Creatinine 1.71, BUN 38, Potassium 3.2, Sodium 141, GFR 33 10/29/2020 Creatinine 1.45, BUN 21, Potassium 4.3, Sodium 139, GFR 38-43 09/26/2020 Creatinine 1.61, BUN 17, Potassium 4.1, Sodium 139 09/18/2020 Creatinine 1.68, BUN 17, Potassium 4.6, Sodium 142, GFR 31-36  09/13/2020 Creatinine 1.34, BUN 10, Potassium 3.1, Sodium 144  A complete set of results can be found in Results Review.   Recommendations:  Left voice mail with ICM number and encouraged to call if experiencing any fluid symptoms.   Follow-up plan: ICM clinic phone appointment on 01/06/2021.   91 day device clinic remote transmission 12/31/2020.     EP/Cardiology Office Visits:   12/12/2020 with Dr Haroldine Laws   Copy of ICM check sent to Dr. Caryl Comes.   3 month ICM trend: 12/04/2020.    1 Year ICM trend:       Rosalene Billings, RN 12/06/2020 9:08 AM

## 2020-12-12 ENCOUNTER — Other Ambulatory Visit: Payer: Self-pay

## 2020-12-12 ENCOUNTER — Ambulatory Visit (HOSPITAL_COMMUNITY)
Admission: RE | Admit: 2020-12-12 | Discharge: 2020-12-12 | Disposition: A | Discharge status: Home / Self Care | Payer: Medicare PPO | Source: Ambulatory Visit | Attending: Internal Medicine | Admitting: Internal Medicine

## 2020-12-12 ENCOUNTER — Encounter (HOSPITAL_COMMUNITY): Payer: Self-pay | Admitting: Internal Medicine

## 2020-12-12 ENCOUNTER — Ambulatory Visit (HOSPITAL_BASED_OUTPATIENT_CLINIC_OR_DEPARTMENT_OTHER)
Admission: RE | Admit: 2020-12-12 | Discharge: 2020-12-12 | Disposition: A | Discharge status: Home / Self Care | Payer: Medicare PPO | Source: Ambulatory Visit | Attending: Internal Medicine | Admitting: Internal Medicine

## 2020-12-12 VITALS — BP 120/72 | HR 77 | Wt 198.4 lb

## 2020-12-12 DIAGNOSIS — I5022 Chronic systolic (congestive) heart failure: Secondary | ICD-10-CM | POA: Insufficient documentation

## 2020-12-12 DIAGNOSIS — E785 Hyperlipidemia, unspecified: Secondary | ICD-10-CM | POA: Diagnosis not present

## 2020-12-12 DIAGNOSIS — I10 Essential (primary) hypertension: Secondary | ICD-10-CM | POA: Diagnosis not present

## 2020-12-12 DIAGNOSIS — I480 Paroxysmal atrial fibrillation: Secondary | ICD-10-CM

## 2020-12-12 DIAGNOSIS — G4733 Obstructive sleep apnea (adult) (pediatric): Secondary | ICD-10-CM

## 2020-12-12 DIAGNOSIS — E669 Obesity, unspecified: Secondary | ICD-10-CM

## 2020-12-12 DIAGNOSIS — I428 Other cardiomyopathies: Secondary | ICD-10-CM | POA: Diagnosis not present

## 2020-12-12 LAB — ECHOCARDIOGRAM COMPLETE
Area-P 1/2: 4.49 cm2
S' Lateral: 3.2 cm

## 2020-12-12 MED ORDER — METOLAZONE 2.5 MG PO TABS: 2.5000 mg | ORAL_TABLET | ORAL | 4 refills | Status: DC

## 2020-12-12 NOTE — Patient Instructions (Signed)
Increase Metalazone  2.5 mg (1 tablet)  On Mondays and Thursdays  Follow up lab work in 2 weeks and 4 weeks  Your physician recommends that you schedule a follow-up appointment in: 2-3 months  If you have any questions or concerns before your next appointment please send [place] a message through [place] or call our office at [phone].    TO LEAVE A MESSAGE FOR THE NURSE SELECT OPTION 2, PLEASE LEAVE A MESSAGE INCLUDING: . YOUR NAME . DATE OF BIRTH . CALL BACK NUMBER . REASON FOR CALL**this is important as we prioritize the call backs  [place] AS LONG AS YOU CALL BEFORE 4:00 PM   At the [facility], you and your health needs are our priority. As part of our continuing mission to provide you with exceptional heart care, we have created designated Provider Care Teams. These Care Teams include your primary Cardiologist (physician) and Advanced Practice Providers (APPs- Physician Assistants and Nurse Practitioners) who all work together to provide you with the care you need, when you need it.   You may see any of the following providers on your designated Care Team at your next follow up: Marland Kitchen Dr Glori Bickers . Dr Loralie Champagne . Dr Vickki Muff . Darrick Grinder, NP . Lyda Jester, [place] . Audry Riles, PharmD   Please be sure to bring in all your medications bottles to every appointment.

## 2020-12-12 NOTE — Progress Notes (Signed)
ADVANCED HF CLINIC NOTE  PCP: Clinton Quant at [facility] Cardiologist: Dr. Fransico Him HF cardiologist: Dr. Haroldine Laws  HPI: [name] is a 65 y/o woman with history of breast CA in 1998 treated with Adriamycin x 4 cycles. In 2007, diagnosed with CHF with EF 25-35%. She is s/p Medtronic BiV-ICD in 2008 with Sprint Fidelis lead which was subsequently replaced. LV function has subsequently recovered  Had cath 12/10. Coronaries normal. Right atrial pressure mean of 7, RV pressure 28/6 . Pulmonary artery pressure was 23/9 with mean of 15.  Pulmonary capillary wedge pressure mean of 9. Fick cardiac output 3.9 L per minute.  Cardiac index 2.2 L per minute per meter squared.  Pulmonary vascular resistance was 1.5 [place] units.  [place] 2019  RA = 11 RV = 27/5 PA = 24/3 (17) PCW = 9 Fick cardiac output/index = 4.8/2.5 PVR = 1.8 WU Ao sat = 98% PA sat = 66%, 67%  Echo 2/21 EF 50%-55% mild to moderate TR. Personally reviewed  In 9/21 admitted to [facility] with Covid (despite vaccination/boosting) was there for 6 days and got better. In 12/21 started having falls. SBP in 50s. Found to be septic with AKI.  BCx negative. Stopped all HF meds and diuretics. Gained 10 pounds.  Echo done EF 35-40%  Seen for HF follow 09/13/20. Volume overloaded by ICD interrogation. Torsemide was increased to 60 mg bid, losartan and carvedilol restarted.   Today she returns for HF follow up. Taking torsemide 40 bid and metolazone every Thursday. With very good output. Still notes increasing congesting with orthopnea.. + DOE.   Echo today 12/12/20 EF 40-45% with dyssynchrony. RV ok Personally reviewed  ICD interrogation: Volume up since mid March. No VT/AF. Activity level 2-3 hours/day   Studies: Echo 12/21: EF 35-40% (in setting of sepsis in [place]). Echo 2/21: EF 50%-55% mild to moderate TR Echo 9/19: EF 40-45% Echo 11/17: EF 50-55%  Echo 6/16: EF 45-50% Echo 11/15: EF 50% inferior HK GLS  -17.8 Echo 8/14: EF 50% Echo 5/11: EF 50-55%  Echo 4/13 EF 50%. RV normal.  12/16/11 ICD generator replaced due to EOL  ROS: All systems negative except as listed in HPI, PMH and Problem List.  Past Medical History:  Diagnosis Date  . AICD (automatic cardioverter/defibrillator) present   . Biventricular implantable cardiac defibrillator)    Medtronic  . Breast cancer ([place])    s/p mastectomy and chemo with adriamycin (1998)  . CHF (congestive heart failure) (HCC)    takes Lasix and Aldactone daily  . GERD (gastroesophageal reflux disease)    takes Omeprazole daily  . Gout    takes Allopurinol daily  . History of bronchitis 2 yrs ago  . History of shingles   . Hyperlipidemia    takes Simvastatin daily  . Insomnia    takes Ambien nightly  . Nonischemic cardiomyopathy ([place])    EF 55% echo 2012  . NSVT (nonsustained ventricular tachycardia) ([place])   . Obesity   . OSA (obstructive sleep apnea)    moderate OSA with an AHi of 18.6/hr with no significant central sleep apnea and O2 sats as low as 81%.  Now on auto CPAP.    Marland Kitchen Pneumonia 56yrs ago  . Presence of permanent cardiac pacemaker   . Shortness of breath dyspnea    with exertion  . sprint Fidelis     Current Outpatient Medications  Medication Sig Dispense  Refill  . albuterol (VENTOLIN HFA) 108 (90 Base) MCG/ACT inhaler Inhale 2 puffs into the lungs as needed.    Marland Kitchen alendronate (FOSAMAX) 70 MG tablet Take 70 mg by mouth once a week.     Marland Kitchen allopurinol (ZYLOPRIM) 100 MG tablet Take 100 mg by mouth daily.     Marland Kitchen atorvastatin (LIPITOR) 10 MG tablet Take 10 mg by mouth at bedtime.    . carvedilol (COREG) 3.125 MG tablet Take 1 tablet (3.125 mg total) by mouth 2 (two) times daily. 180 tablet 3  . citalopram (CELEXA) 20 MG tablet Take 20 mg by mouth daily.     . empagliflozin (JARDIANCE) 10 MG TABS tablet Take 1 tablet (10 mg total) by mouth daily before breakfast. 30 tablet 11  . losartan (COZAAR) 25 MG tablet Take 1 tablet (25 mg  total) by mouth daily. 90 tablet 3  . metolazone (ZAROXOLYN) 2.5 MG tablet Take 2.5 mg by mouth as needed. Take 1 tablet if weight gain 3 lbs overnight    . Multiple Vitamins-Minerals (ONE DAILY FOR WOMEN 50+ ADV PO) Take by mouth.    . potassium chloride SA (KLOR-CON M20) 20 MEQ tablet TAKE 1 TABLET DAILY BY MOUTH WITH ADDITIONAL 40MEQ WITH EVERY DOSE OF METOLAZONE 115 tablet 3  . simvastatin (ZOCOR) 40 MG tablet TAKE 1 TABLET EVERY EVENING 90 tablet 1  . spironolactone (ALDACTONE) 25 MG tablet TAKE 1 TABLET EVERY DAY 90 tablet 1  . torsemide (DEMADEX) 20 MG tablet Take 40 mg by mouth 2 (two) times daily.    Marland Kitchen venlafaxine XR (EFFEXOR-XR) 150 MG 24 hr capsule Take 150 mg by mouth daily with breakfast.    . vitamin B-12 (CYANOCOBALAMIN) 100 MCG tablet Take 150 mcg by mouth daily.     Marland Kitchen zolpidem (AMBIEN) 5 MG tablet Take 5 mg by mouth at bedtime as needed. For sleep    . Cholecalciferol (VITAMIN D3) 5000 units TABS Take 5,000 Units by mouth daily.     No current facility-administered medications for this encounter.    Vitals:   12/12/20 1018  BP: 120/72  Pulse: 77  SpO2: 100%  Weight: 90 kg (198 lb 6.4 oz)   Wt Readings from Last 3 Encounters:  12/12/20 90 kg (198 lb 6.4 oz)  10/31/20 91.9 kg (202 lb 9.6 oz)  10/17/20 91.2 kg (201 lb)   PHYSICAL EXAM: General:  Well appearing. No resp difficulty HEENT: normal Neck: supple. jvp 6-7  Carotids 2+ bilat; no bruits. No lymphadenopathy or thryomegaly appreciated. Cor: PMI nondisplaced. Regular rate & rhythm. No rubs, gallops or murmurs. Lungs: clear Abdomen: obese soft, nontender, nondistended. No hepatosplenomegaly. No bruits or masses. Good bowel sounds. Extremities: no cyanosis, clubbing, rash, edema Neuro: alert & orientedx3, cranial nerves grossly intact. moves all 4 extremities w/o difficulty. 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 EF 50-55% on echo 11/17 - Echo  9/19 EF 45% with septal dyssynergy (despite biv pacing). - Echo 2/21 EF 50-55% mild to moderate TR - RHC in 10/19 with relatively normal hemodynamics except for RA 10 (PCWP was 9)  - Echo 12/21 at [place] EF 35-40% in setting of sepsis. - Echo today 12/12/20 EF 40-45% with dyssynchrony. RV ok Personally reviewed - Feels better today NYHA II - Volume on exam looks good but Optivol up. Currently on torsemide 40 bid and metolazone every Thursday. Will add metolazone on Monday as well (with KCl 40). Follow labs closely as she  had AKI recently. - Continue spiro 25 mg daily. - Continue losartan 25 mg daily (was on 50). - Continue carvedilol 3.125 mg bid (was on 18.75 bid). - Continue Jardiance 10    2) Medtronic BiV ICD:  - ICD interrogated as above.   3) HTN - Blood pressure well controlled. Continue current regimen.  4) Fatigue/snoring - Sleep study with moderate OSA. Following with Dr. Radford Pax. Unable to tolerate any CPAP. - Dr Radford Pax referred to ENT for [facility] Device.   5) PAF (SCAF)  - 0.7% by ICD - CHA2DS2-VASc 4 - D/w Dr. Caryl Comes given duration < 24 benefit of AC likely does not justify risk based on ASSERT data. Continue to follow on device  6) Obesity Body mass index is 36.29 kg/m. - stable  7) AKI -Creatinine baseline 1.4-1.6 - watch renal function closely with metolazone increase - BMET q 1 week x 2   Glori Bickers, MD  8:01 PM

## 2020-12-12 NOTE — Progress Notes (Signed)
  Echocardiogram 2D Echocardiogram has been performed.  [name] 12/12/2020, 9:55 AM

## 2020-12-24 ENCOUNTER — Other Ambulatory Visit (HOSPITAL_COMMUNITY): Payer: Self-pay | Admitting: Adult Health

## 2020-12-25 LAB — BASIC METABOLIC PANEL
BUN/Creatinine Ratio: 19 (ref 12–28)
BUN: 34 mg/dL — ABNORMAL HIGH (ref 8–27)
CO2: 25 mmol/L (ref 20–29)
Calcium: 9.8 mg/dL (ref 8.7–10.3)
Chloride: 92 mmol/L — ABNORMAL LOW (ref 96–106)
Creatinine, Ser: 1.82 mg/dL — ABNORMAL HIGH (ref 0.57–1.00)
Glucose: 191 mg/dL — ABNORMAL HIGH (ref 65–99)
Potassium: 3.4 mmol/L — ABNORMAL LOW (ref 3.5–5.2)
Sodium: 136 mmol/L (ref 134–144)
eGFR: 30 mL/min/{1.73_m2} — ABNORMAL LOW (ref >59)

## 2020-12-31 ENCOUNTER — Ambulatory Visit (INDEPENDENT_AMBULATORY_CARE_PROVIDER_SITE_OTHER): Payer: Medicare PPO

## 2020-12-31 DIAGNOSIS — I428 Other cardiomyopathies: Secondary | ICD-10-CM | POA: Diagnosis not present

## 2020-12-31 LAB — CUP PACEART REMOTE DEVICE CHECK
Battery Remaining Longevity: 18 mo
Battery Voltage: 2.9 V
Brady Statistic AP VP Percent: 7.88 %
Brady Statistic AP VS Percent: 0.01 %
Brady Statistic AS VP Percent: 92.08 %
Brady Statistic AS VS Percent: 0.04 %
Brady Statistic RA Percent Paced: 7.88 %
Brady Statistic RV Percent Paced: 99.68 %
Date Time Interrogation Session: 20220426001804
HighPow Impedance: 75 Ohm
Implantable Lead Implant Date: 20070821
Implantable Lead Implant Date: 20070821
Implantable Lead Implant Date: 20160211
Implantable Lead Location: 753858
Implantable Lead Location: 753859
Implantable Lead Location: 753860
Implantable Lead Model: 4194
Implantable Lead Model: 5076
Implantable Lead Model: 6935
Implantable Pulse Generator Implant Date: 20160211
Lead Channel Impedance Value: 171 Ohm
Lead Channel Impedance Value: 380 Ohm
Lead Channel Impedance Value: 456 Ohm
Lead Channel Impedance Value: 513 Ohm
Lead Channel Impedance Value: 532 Ohm
Lead Channel Impedance Value: 722 Ohm
Lead Channel Pacing Threshold Amplitude: 0.5 V
Lead Channel Pacing Threshold Amplitude: 1.125 V
Lead Channel Pacing Threshold Amplitude: 1.625 V
Lead Channel Pacing Threshold Pulse Width: 0.4 ms
Lead Channel Pacing Threshold Pulse Width: 0.4 ms
Lead Channel Pacing Threshold Pulse Width: 0.4 ms
Lead Channel Sensing Intrinsic Amplitude: 0.625 mV
Lead Channel Sensing Intrinsic Amplitude: 0.625 mV
Lead Channel Sensing Intrinsic Amplitude: 19.375 mV
Lead Channel Sensing Intrinsic Amplitude: 19.375 mV
Lead Channel Setting Pacing Amplitude: 1.5 V
Lead Channel Setting Pacing Amplitude: 1.75 V
Lead Channel Setting Pacing Amplitude: 3 V
Lead Channel Setting Pacing Pulse Width: 0.4 ms
Lead Channel Setting Pacing Pulse Width: 0.4 ms
Lead Channel Setting Sensing Sensitivity: 0.3 mV

## 2021-01-06 ENCOUNTER — Ambulatory Visit (INDEPENDENT_AMBULATORY_CARE_PROVIDER_SITE_OTHER): Payer: Medicare PPO

## 2021-01-06 ENCOUNTER — Other Ambulatory Visit (HOSPITAL_COMMUNITY): Payer: Self-pay | Admitting: Adult Health

## 2021-01-06 DIAGNOSIS — Z95810 Presence of automatic (implantable) cardiac defibrillator: Secondary | ICD-10-CM | POA: Diagnosis not present

## 2021-01-06 DIAGNOSIS — I5022 Chronic systolic (congestive) heart failure: Secondary | ICD-10-CM

## 2021-01-07 LAB — BASIC METABOLIC PANEL
BUN/Creatinine Ratio: 17 (ref 12–28)
BUN: 30 mg/dL — ABNORMAL HIGH (ref 8–27)
CO2: 27 mmol/L (ref 20–29)
Calcium: 9.8 mg/dL (ref 8.7–10.3)
Chloride: 97 mmol/L (ref 96–106)
Creatinine, Ser: 1.77 mg/dL — ABNORMAL HIGH (ref 0.57–1.00)
Glucose: 106 mg/dL — ABNORMAL HIGH (ref 65–99)
Potassium: 3.8 mmol/L (ref 3.5–5.2)
Sodium: 139 mmol/L (ref 134–144)
eGFR: 31 mL/min/{1.73_m2} — ABNORMAL LOW (ref >59)

## 2021-01-09 ENCOUNTER — Telehealth: Payer: Self-pay

## 2021-01-09 NOTE — Telephone Encounter (Signed)
LMOVM for patient to send a transmission for missed HF appointment. I left the device clinic and Carelink tech support number to call if she needs help.   In Carelink there is a message:   Product/process development scientist Monitor: Clinic Acknowledged - See Patient Details  Patient is unable to consistently transmit at this time. Connection status set on:12-Feb-2016Last communication occurred on:05-Jan-2021 (4 days ago)

## 2021-01-10 NOTE — Progress Notes (Signed)
EPIC Encounter for ICM Monitoring  Patient Name: [name] is a 65 y.o. female Date: 01/10/2021 Primary Care Physican: Wayna Chalet, NP Primary Cardiologist:Bensimhon Electrophysiologist:Klein Bi-V Pacing:99.5% 5/6/2022HomeWeight:190lbs   Time in AT/AF<0.1 hr/day (<0.1%)   Spoke with patient and she is feeling well.  She took Metolazone on 5/5 as prescribed resulting in good urine ouput.  Optivol thoracic impedance suggestingpossible fluid accumulation starting 01/03/2021.  Prescribed:  Torsemide20 mg take40 mg twice a day.  Metolazone 2.5 mgTake 2.5 mg by mouth on Monday and Thursdays  Potassium 20 mEq take 1 tablet daily with additional 40 mEq with every Metolazone dose  Spironolactone 25 mg take 1 tablet daily.  Labs: 01/06/2021 Creatinine 1.77, BUN 30, Potassium 3.8, Sodium 139, GFR 31 12/24/2020 Creatinine 1.82, BUN 34, Potassium 3.4, Sodium 136, GFR 30  11/12/2020 Creatinine 1.71, BUN 38, Potassium 3.2, Sodium 141, GFR 33 10/29/2020 Creatinine 1.45, BUN 21, Potassium 4.3, Sodium 139, GFR 38-43 09/26/2020 Creatinine1.61, BUN17, Potassium4.1, Sodium139 09/18/2020 Creatinine1.68, BUN17, Potassium4.6, Sodium142, [id number]  09/13/2020 Creatinine1.34, BUN10, Potassium3.1, [id number] A complete set of results can be found in Results Review.  Recommendations:Confirmed compliance with medications and taking as prescribed.  Copy sent to Dr Haroldine Laws for review and recommendations.   Follow-up plan: ICM clinic phone appointment on5/06/2021 to recheck fluid levels. 91 day device clinic remote transmission 04/01/2021.   EP/Cardiology Office Visits:02/24/2021 with Dr Haroldine Laws.  04/08/2021 with Dr Caryl Comes.  Copy of ICM check sent to [place].   3 month ICM trend: 01/10/2021.    1 Year ICM trend:       Rosalene Billings, RN 01/10/2021 9:30 AM

## 2021-01-14 ENCOUNTER — Ambulatory Visit (INDEPENDENT_AMBULATORY_CARE_PROVIDER_SITE_OTHER): Payer: Medicare PPO

## 2021-01-14 DIAGNOSIS — I5022 Chronic systolic (congestive) heart failure: Secondary | ICD-10-CM

## 2021-01-14 DIAGNOSIS — Z95810 Presence of automatic (implantable) cardiac defibrillator: Secondary | ICD-10-CM

## 2021-01-14 NOTE — Progress Notes (Signed)
EPIC Encounter for ICM Monitoring  Patient Name: [name] is a 65 y.o. female Date: 01/14/2021 Primary Care Physican: Wayna Chalet, NP Primary Cardiologist:Bensimhon Electrophysiologist:Klein Bi-V Pacing:99.3% 5/6/2022HomeWeight:190lbs   Time in AT/AF<0.1 hr/day (<0.1%)   Spoke with patient and reports feeling well at this time.  Denies fluid symptoms.    Optivol thoracic impedance suggesting fluid levels returned to normal.  Prescribed:  Torsemide20 mg take40 mg twice a day.  Metolazone 2.5 mgTake 2.5 mg by mouth on Monday and Thursdays  Potassium 20 mEq take 1 tablet daily with additional 40 mEq with every Metolazone dose  Spironolactone 25 mg take 1 tablet daily.  Labs: 01/06/2021 Creatinine 1.77, BUN 30, Potassium 3.8, Sodium 139, GFR 31 12/24/2020 Creatinine 1.82, BUN 34, Potassium 3.4, Sodium 136, GFR 30  11/12/2020 Creatinine 1.71, BUN 38, Potassium 3.2, Sodium 141, GFR 33 10/29/2020 Creatinine 1.45, BUN 21, Potassium 4.3, Sodium 139, GFR 38-43 09/26/2020 Creatinine1.61, BUN17, Potassium4.1, Sodium139 09/18/2020 Creatinine1.68, BUN17, Potassium4.6, Sodium142, [id number]  09/13/2020 Creatinine1.34, BUN10, Potassium3.1, [id number] A complete set of results can be found in Results Review.  Recommendations:No changes and encouraged to call if experiencing any fluid symptoms.  Follow-up plan: ICM clinic phone appointment on6/20/2022. 91 day device clinic remote transmission 04/01/2021.   EP/Cardiology Office Visits:02/24/2021 with Dr Haroldine Laws.  04/08/2021 with Dr Caryl Comes.  Copy of ICM check sent to Dr.Klein and Dr Haroldine Laws as Juluis Rainier.   3 month ICM trend: 01/14/2021.    1 Year ICM trend:       Rosalene Billings, RN 01/14/2021 8:43 AM

## 2021-01-15 ENCOUNTER — Telehealth: Payer: Self-pay

## 2021-01-15 MED ORDER — BISOPROLOL FUMARATE 5 MG PO TABS: 2.5000 mg | ORAL_TABLET | Freq: Every day | ORAL | 5 refills | Status: DC

## 2021-01-15 MED ORDER — ELIQUIS 5 MG PO TABS: 5.0000 mg | ORAL_TABLET | Freq: Two times a day (BID) | ORAL | 5 refills | Status: DC

## 2021-01-15 NOTE — Telephone Encounter (Signed)
After reviewin episode and consulting with Dr. Haroldine Laws, Dr. Caryl Comes noted that episode appears AF with RVR.  Patient to stop Carvedilol and start Bisoprolol 2.5mg  daily.  Also since AF no longer appears subclinical.  MD recommends pt start Eliquis 5mg  BID.   Spoke with patient.  Advised of MD recommendations and answer provided eduction for starting anticoagulation, including warnings about s/s of bleeding to monitor for and report.    New rxs sent to pt pharmacy as requested.

## 2021-01-15 NOTE — Telephone Encounter (Signed)
Carelink alert- 1 VF event appears AF/AFL w/ RVR average v rate 214bpm; 35J shock delivered successful with slowing ventricular rate.  1 VT event appears AF w/ RVR; ATP delivered - unsuccessful with converting arrhythmia.  There were 7 AT/AF event & 2 SVT events all appear A fib.  Ventricular histogram appears controlled.  No reported hx PAF.  No OAC.    Current meds include- Carvedilol 3.125mg  BID, Metolazone 2.5mg  on Mondays and Thursdays, Torsemide 40mg  BID, Potassium 5meq daily with additional 40 meq when taking metolazone.    Spoke with patient, she was aware of shock occurring.  States at time of episode she was in the shower.  She denies any cardiac symptoms prior to or after shock.  Patient reports she currently feels fine.    Reviewed shock plan with patient.  Also advised of DMV driving restrictions in place- No driving for 6 months, unless MD deems this to be inappropriate.    Reviewed episodes with Dr. Caryl Comes, pending communication from Dr. Haroldine Laws for resolution.

## 2021-01-16 NOTE — Telephone Encounter (Signed)
Pt scheduled for appt with APP per message.   She said that she is scheduled for surgery on her finger on 5/24 and wants to know what she should do.   Please call.

## 2021-01-16 NOTE — Telephone Encounter (Signed)
Attempted phone call to pt.  Per Epic ok to leave message.  Pt advised due to having Eliquis added to her medication regimen she will need to contact her surgeon to advise of medication addition so clearance request can be submitted by surgeon's office.  Please contact RN at [phone] for any additional concerns or questions.

## 2021-01-21 NOTE — Progress Notes (Signed)
Remote ICD transmission.   

## 2021-02-10 ENCOUNTER — Other Ambulatory Visit: Payer: Self-pay

## 2021-02-10 ENCOUNTER — Telehealth: Payer: Self-pay | Admitting: Nurse Practitioner

## 2021-02-10 ENCOUNTER — Encounter: Payer: Self-pay | Admitting: Student

## 2021-02-10 ENCOUNTER — Ambulatory Visit (INDEPENDENT_AMBULATORY_CARE_PROVIDER_SITE_OTHER): Payer: Medicare PPO | Admitting: Student

## 2021-02-10 VITALS — BP 98/64 | HR 94 | Ht 62.0 in | Wt 197.2 lb

## 2021-02-10 DIAGNOSIS — I480 Paroxysmal atrial fibrillation: Secondary | ICD-10-CM

## 2021-02-10 DIAGNOSIS — Z79899 Other long term (current) drug therapy: Secondary | ICD-10-CM

## 2021-02-10 DIAGNOSIS — E876 Hypokalemia: Secondary | ICD-10-CM

## 2021-02-10 DIAGNOSIS — I5022 Chronic systolic (congestive) heart failure: Secondary | ICD-10-CM

## 2021-02-10 LAB — BASIC METABOLIC PANEL
BUN/Creatinine Ratio: 17 (ref 12–28)
BUN: 31 mg/dL — ABNORMAL HIGH (ref 8–27)
CO2: 25 mmol/L (ref 20–29)
Calcium: 9 mg/dL (ref 8.7–10.3)
Chloride: 91 mmol/L — ABNORMAL LOW (ref 96–106)
Creatinine, Ser: 1.78 mg/dL — ABNORMAL HIGH (ref 0.57–1.00)
Glucose: 114 mg/dL — ABNORMAL HIGH (ref 65–99)
Potassium: 2.7 mmol/L — CL (ref 3.5–5.2)
Sodium: 135 mmol/L (ref 134–144)
eGFR: 31 mL/min/{1.73_m2} — ABNORMAL LOW (ref >59)

## 2021-02-10 LAB — CBC
Hematocrit: 34.5 % (ref 34.0–46.6)
Hemoglobin: 11.8 g/dL (ref 11.1–15.9)
MCH: 32.7 pg (ref 26.6–33.0)
MCHC: 34.2 g/dL (ref 31.5–35.7)
MCV: 96 fL (ref 79–97)
Platelets: 277 10*3/uL (ref 150–450)
RBC: 3.61 x10E6/uL — ABNORMAL LOW (ref 3.77–5.28)
RDW: 13.7 % (ref 11.7–15.4)
WBC: 8.8 10*3/uL (ref 3.4–10.8)

## 2021-02-10 LAB — MAGNESIUM: Magnesium: 2.3 mg/dL (ref 1.6–2.3)

## 2021-02-10 NOTE — Telephone Encounter (Signed)
   Pt seen in EP clinic w/ labs earlier today.  K returned 2.7.  Creat stable @ 1.78.  Pt currently taking Kdur 11meq daily.  I called pts phone several times, but she did not answer and her voicemail is not set up.  I was able to reach her husband Kasandra Knudsen @ [phone] and provided instructions for potassium supplementation and dose adjustment.  I rec that she take 40 meq now and 40 meq again in 2-3 hrs.  Beginning tomorrow, she should take Kdur 26meq daily w/ plan for f/u BMET on Thursday.  Pt lives in [place], [place], thus it will not likely be ideal to drive back down to [place] later this week.  Her husband notes that she usually as labs @ her PCP office when needed.  I advised that I will ask our team to reach back out to them in the AM to ensure that repeat labs can be appropriately scheduled.  Lab Results  Component Value Date   CREATININE 1.78 (H) 02/10/2021   BUN 31 (H) 02/10/2021   NA 135 02/10/2021   K 2.7 (LL) 02/10/2021   CL 91 (L) 02/10/2021   CO2 25 02/10/2021     Caller verbalized understanding and was grateful for the call back.  Murray Hodgkins, NP 02/10/2021, 6:44 PM

## 2021-02-10 NOTE — Progress Notes (Signed)
[facility]   Electrophysiology Office Note Date: 02/10/2021  ID:  Natasha, [name] April 12, 1956, MRN [id number]  PCP: Wayna Chalet, NP Primary Cardiologist: None Electrophysiologist: Virl Axe, MD   CC: Routine ICD follow-up  [name] is a 65 y.o. female seen today for Virl Axe, MD for routine electrophysiology followup.  Since last being seen in our clinic the patient reports doing very well. She has mild bruising on eliquis but no overt bleeding.  she denies chest pain, palpitations, dyspnea, PND, orthopnea, nausea, vomiting, dizziness, syncope, edema, weight gain, or early satiety. She has had no further ICD shocks.   Device History: Medtronic BiV ICD implanted 2007, Gen change 2013, gen change with RV lead revision 2016 (previous lead fractured and abandoned) for NICM   Past Medical History:  Diagnosis Date  . AICD (automatic cardioverter/defibrillator) present   . Biventricular implantable cardiac defibrillator)    Medtronic  . Breast cancer ([place])    s/p mastectomy and chemo with adriamycin (1998)  . CHF (congestive heart failure) (HCC)    takes Lasix and Aldactone daily  . GERD (gastroesophageal reflux disease)    takes Omeprazole daily  . Gout    takes Allopurinol daily  . History of bronchitis 2 yrs ago  . History of shingles   . Hyperlipidemia    takes Simvastatin daily  . Insomnia    takes Ambien nightly  . Nonischemic cardiomyopathy ([place])    EF 55% echo 2012  . NSVT (nonsustained ventricular tachycardia) ([place])   . Obesity   . OSA (obstructive sleep apnea)    moderate OSA with an AHi of 18.6/hr with no significant central sleep apnea and O2 sats as low as 81%.  Now on auto CPAP.    Marland Kitchen Pneumonia 70yrs ago  . Presence of permanent cardiac pacemaker   . Shortness of breath dyspnea    with exertion  . sprint Fidelis    Past Surgical History:  Procedure Laterality Date  . ABDOMINAL HYSTERECTOMY    . APPENDECTOMY  1978  . BiV ICD  2007    Medtronic  . BIV ICD GENERTAOR CHANGE OUT N/A 12/16/2011   Procedure: BIV ICD GENERTAOR CHANGE OUT;  Surgeon: [name], MD;  Location: [facility] CATH LAB;  Service: Cardiovascular;  Laterality: N/A;  . COLONOSCOPY    . cyst removed from wrist Left    early 70's  . ICD LEAD REMOVAL Right 10/18/2014   Procedure: ICD LEAD REMOVAL/EXTRACTION/REIMPLANTATION;  Surgeon: Evans Lance, MD;  [facility];  Service: Cardiovascular;  Laterality: Right;  . MASTECTOMY Left 1998  . port a cath placed  1998  . port removed  1998  . RIGHT HEART CATH N/A 06/20/2018   Procedure: RIGHT HEART CATH;  Surgeon: Jolaine Artist, MD;  Location: [facility] CV LAB;  Service: Cardiovascular;  Laterality: N/A;    Current Outpatient Medications  Medication Sig Dispense Refill  . allopurinol (ZYLOPRIM) 100 MG tablet Take 100 mg by mouth daily.     Marland Kitchen apixaban (ELIQUIS) 5 MG TABS tablet Take 1 tablet (5 mg total) by mouth 2 (two) times daily. 60 tablet 5  . atorvastatin (LIPITOR) 10 MG tablet Take 10 mg by mouth at bedtime.    . bisoprolol (ZEBETA) 5 MG tablet Take 0.5 tablets (2.5 mg total) by mouth daily. 15 tablet 5  . Cholecalciferol (VITAMIN D3) 5000 units TABS Take 5,000 Units by mouth daily.    . citalopram (CELEXA) 20 MG tablet Take 20 mg  by mouth daily.     . empagliflozin (JARDIANCE) 10 MG TABS tablet Take 1 tablet (10 mg total) by mouth daily before breakfast. 30 tablet 11  . losartan (COZAAR) 25 MG tablet Take 1 tablet (25 mg total) by mouth daily. 90 tablet 3  . metolazone (ZAROXOLYN) 2.5 MG tablet Take 1 tablet (2.5 mg total) by mouth 2 (two) times a week. On Mondays and Thursdays 24 tablet 4  . Multiple Vitamins-Minerals (ONE DAILY FOR WOMEN 50+ ADV PO) Take by mouth.    . potassium chloride SA (KLOR-CON M20) 20 MEQ tablet TAKE 1 TABLET DAILY BY MOUTH WITH ADDITIONAL 40MEQ WITH EVERY DOSE OF METOLAZONE 115 tablet 3  . simvastatin (ZOCOR) 40 MG tablet TAKE 1 TABLET EVERY EVENING 90 tablet 1  .  spironolactone (ALDACTONE) 25 MG tablet TAKE 1 TABLET EVERY DAY 90 tablet 1  . torsemide (DEMADEX) 20 MG tablet Take 40 mg by mouth 2 (two) times daily.    Marland Kitchen venlafaxine XR (EFFEXOR-XR) 150 MG 24 hr capsule Take 150 mg by mouth daily with breakfast.    . vitamin B-12 (CYANOCOBALAMIN) 100 MCG tablet Take 150 mcg by mouth daily.     Marland Kitchen zolpidem (AMBIEN) 5 MG tablet Take 5 mg by mouth at bedtime as needed. For sleep     No current facility-administered medications for this visit.    Allergies:   Buprenorphine hcl, Morphine and related, Percocet [oxycodone-acetaminophen], and Oxycodone   Social History: Social History   Socioeconomic History  . Marital status: Married    Spouse name: Not on file  . Number of children: Not on file  . Years of education: Not on file  . Highest education level: Not on file  Occupational History  . Not on file  Tobacco Use  . Smoking status: Never Smoker  . Smokeless tobacco: Never Used  Vaping Use  . Vaping Use: Never used  Substance and Sexual Activity  . Alcohol use: No  . Drug use: No  . Sexual activity: Yes    Birth control/protection: None  Other Topics Concern  . Not on file  Social History Narrative  . Not on file   Social Determinants of Health   Financial Resource Strain: Not on file  Food Insecurity: Not on file  Transportation Needs: Not on file  Physical Activity: Not on file  Stress: Not on file  Social Connections: Not on file  Intimate Partner Violence: Not on file    Family History: Family History  Problem Relation Age of Onset  . Diabetes Mother   . Heart disease Father   . Alzheimer's disease Father     Review of Systems: All other systems reviewed and are otherwise negative except as noted above.   Physical Exam: Vitals:   02/10/21 1005  BP: 98/64  Pulse: 94  SpO2: 95%  Weight: 197 lb 3.2 oz (89.4 kg)  Height: 5\' 2"  (1.575 m)     GEN- The patient is well appearing, alert and oriented x 3 today.    HEENT: normocephalic, atraumatic; sclera clear, conjunctiva pink; hearing intact; oropharynx clear; neck supple, no JVP Lymph- no cervical lymphadenopathy Lungs- Clear to ausculation bilaterally, normal work of breathing.  No wheezes, rales, rhonchi Heart- Regular rate and rhythm, no murmurs, rubs or gallops, PMI not laterally displaced GI- soft, non-tender, non-distended, bowel sounds present, no hepatosplenomegaly Extremities- no clubbing or cyanosis. No edema; DP/PT/radial pulses 2+ bilaterally MS- no significant deformity or atrophy Skin- warm and dry, no rash or lesion;  ICD pocket well healed Psych- euthymic mood, full affect Neuro- strength and sensation are intact  ICD interrogation- reviewed in detail today,  See PACEART report  EKG:  EKG is not ordered today.  Recent Labs: 09/13/2020: Hemoglobin 10.0; Platelets 284 01/06/2021: BUN 30; Creatinine, Ser 1.77; Potassium 3.8; Sodium 139   Wt Readings from Last 3 Encounters:  02/10/21 197 lb 3.2 oz (89.4 kg)  12/12/20 198 lb 6.4 oz (90 kg)  10/31/20 202 lb 9.6 oz (91.9 kg)     Other studies Reviewed: Additional studies/ records that were reviewed today include: Previous EP and CHF office notes.    Assessment and Plan:  1.  Chronic systolic dysfunction s/p Medtronic CRT-D  Echo 12/2020 with LVEF 40-45% euvolemic today Stable on an appropriate medical regimen by CHF team Normal ICD function See Pace Art report No changes today  2. Paroxysmal atrial fibrillation  Recently started on eliquis after having crossed threshold of SCAF CHA2DS2VASC of at least 4 now on appropriately dosed Eliquis.  Discussed indications for [facility] today.  Recent had inappropriate therapy due to AF RVR. BB adjusted. BMET, CBC and Mg today  3. Inappropriate therapies X 2 total, with no appropriate shocks.  Could consider making her VT zones far less aggressive. Will defer to Dr. Aquilla Hacker visit in August to discuss the risks and benefits of this approach  more fully with the patient.   Current medicines are reviewed at length with the patient today.   The patient does not have concerns regarding her medicines.  The following changes were made today:  none  Labs/ tests ordered today include:  Orders Placed This Encounter  Procedures  . Basic metabolic panel  . CBC  . Magnesium    Disposition:   Follow up with Dr. Caryl Comes as scheduled.   Jacalyn Lefevre, PA-C  02/10/2021 10:38 AM  [facility] [address] [place] [address] [phone] (office) [phone] (fax)

## 2021-02-10 NOTE — Patient Instructions (Signed)
Medication Instructions:  Your physician recommends that you continue on your current medications as directed. Please refer to the Current Medication list given to you today.  *If you need a refill on your cardiac medications before your next appointment, please call your pharmacy*   Lab Work: TODAY: BMET, MAG, CBC  If you have labs (blood work) drawn today and your tests are completely normal, you will receive your results only by: Marland Kitchen MyChart Message (if you have MyChart) OR . A paper copy in the mail If you have any lab test that is abnormal or we need to change your treatment, we will call you to review the results.   Follow-Up: At [facility], you and your health needs are our priority.  As part of our continuing mission to provide you with exceptional heart care, we have created designated Provider Care Teams.  These Care Teams include your primary Cardiologist (physician) and Advanced Practice Providers (APPs -  Physician Assistants and Nurse Practitioners) who all work together to provide you with the care you need, when you need it.  Your next appointment:   As scheduled

## 2021-02-11 ENCOUNTER — Other Ambulatory Visit: Payer: Self-pay

## 2021-02-11 DIAGNOSIS — Z79899 Other long term (current) drug therapy: Secondary | ICD-10-CM

## 2021-02-11 DIAGNOSIS — E876 Hypokalemia: Secondary | ICD-10-CM

## 2021-02-11 NOTE — Telephone Encounter (Addendum)
Spoke with the pt and she is having her labs drawn for  repeat K at the end of the week at [facility] with her PCP Tory Emerald NP [phone].   I called their office and faxed order directly to their [place] per Angie.Marland KitchenMarland [phone].

## 2021-02-11 NOTE — Addendum Note (Signed)
Addended by: Stephani Police on: 02/11/2021 09:04 AM   Modules accepted: Orders

## 2021-02-19 LAB — BASIC METABOLIC PANEL
BUN/Creatinine Ratio: 13 (ref 12–28)
BUN: 21 mg/dL (ref 8–27)
CO2: 27 mmol/L (ref 20–29)
Calcium: 10.1 mg/dL (ref 8.7–10.3)
Chloride: 96 mmol/L (ref 96–106)
Creatinine, Ser: 1.66 mg/dL — ABNORMAL HIGH (ref 0.57–1.00)
Glucose: 155 mg/dL — ABNORMAL HIGH (ref 65–99)
Potassium: 3.9 mmol/L (ref 3.5–5.2)
Sodium: 142 mmol/L (ref 134–144)
eGFR: 34 mL/min/{1.73_m2} — ABNORMAL LOW (ref >59)

## 2021-02-24 ENCOUNTER — Encounter (HOSPITAL_COMMUNITY): Payer: Self-pay | Admitting: Internal Medicine

## 2021-02-24 ENCOUNTER — Ambulatory Visit (HOSPITAL_COMMUNITY)
Admission: RE | Admit: 2021-02-24 | Discharge: 2021-02-24 | Disposition: A | Discharge status: Home / Self Care | Payer: Medicare PPO | Source: Ambulatory Visit | Attending: Internal Medicine | Admitting: Internal Medicine

## 2021-02-24 ENCOUNTER — Ambulatory Visit (INDEPENDENT_AMBULATORY_CARE_PROVIDER_SITE_OTHER): Payer: Medicare PPO

## 2021-02-24 ENCOUNTER — Other Ambulatory Visit: Payer: Self-pay

## 2021-02-24 VITALS — BP 104/70 | HR 77 | Wt 199.4 lb

## 2021-02-24 DIAGNOSIS — I10 Essential (primary) hypertension: Secondary | ICD-10-CM

## 2021-02-24 DIAGNOSIS — Z853 Personal history of malignant neoplasm of breast: Secondary | ICD-10-CM | POA: Insufficient documentation

## 2021-02-24 DIAGNOSIS — Z95810 Presence of automatic (implantable) cardiac defibrillator: Secondary | ICD-10-CM

## 2021-02-24 DIAGNOSIS — I5022 Chronic systolic (congestive) heart failure: Secondary | ICD-10-CM | POA: Diagnosis not present

## 2021-02-24 DIAGNOSIS — Z79899 Other long term (current) drug therapy: Secondary | ICD-10-CM | POA: Diagnosis not present

## 2021-02-24 DIAGNOSIS — Z8619 Personal history of other infectious and parasitic diseases: Secondary | ICD-10-CM | POA: Diagnosis not present

## 2021-02-24 DIAGNOSIS — G4733 Obstructive sleep apnea (adult) (pediatric): Secondary | ICD-10-CM | POA: Insufficient documentation

## 2021-02-24 DIAGNOSIS — N1832 Chronic kidney disease, stage 3b: Secondary | ICD-10-CM | POA: Diagnosis not present

## 2021-02-24 DIAGNOSIS — Z8616 Personal history of COVID-19: Secondary | ICD-10-CM | POA: Diagnosis not present

## 2021-02-24 DIAGNOSIS — I130 Hypertensive heart and chronic kidney disease with heart failure and stage 1 through stage 4 chronic kidney disease, or unspecified chronic kidney disease: Secondary | ICD-10-CM | POA: Diagnosis not present

## 2021-02-24 DIAGNOSIS — E669 Obesity, unspecified: Secondary | ICD-10-CM | POA: Diagnosis not present

## 2021-02-24 DIAGNOSIS — Z6836 Body mass index (BMI) 36.0-36.9, adult: Secondary | ICD-10-CM | POA: Insufficient documentation

## 2021-02-24 DIAGNOSIS — Z7901 Long term (current) use of anticoagulants: Secondary | ICD-10-CM | POA: Insufficient documentation

## 2021-02-24 DIAGNOSIS — I480 Paroxysmal atrial fibrillation: Secondary | ICD-10-CM | POA: Insufficient documentation

## 2021-02-24 LAB — BASIC METABOLIC PANEL
Anion gap: 10 (ref 5–15)
BUN: 29 mg/dL — ABNORMAL HIGH (ref 8–23)
CO2: 30 mmol/L (ref 22–32)
Calcium: 9.5 mg/dL (ref 8.9–10.3)
Chloride: 97 mmol/L — ABNORMAL LOW (ref 98–111)
Creatinine, Ser: 1.64 mg/dL — ABNORMAL HIGH (ref 0.44–1.00)
GFR, Estimated: 34 mL/min — ABNORMAL LOW (ref >60)
Glucose, Bld: 89 mg/dL (ref 70–99)
Potassium: 3.4 mmol/L — ABNORMAL LOW (ref 3.5–5.1)
Sodium: 137 mmol/L (ref 135–145)

## 2021-02-24 LAB — CBC
HCT: 37.2 % (ref 36.0–46.0)
Hemoglobin: 11.8 g/dL — ABNORMAL LOW (ref 12.0–15.0)
MCH: 32.5 pg (ref 26.0–34.0)
MCHC: 31.7 g/dL (ref 30.0–36.0)
MCV: 102.5 fL — ABNORMAL HIGH (ref 80.0–100.0)
Platelets: 246 10*3/uL (ref 150–400)
RBC: 3.63 MIL/uL — ABNORMAL LOW (ref 3.87–5.11)
RDW: 14.7 % (ref 11.5–15.5)
WBC: 7.4 10*3/uL (ref 4.0–10.5)
nRBC: 0 % (ref 0.0–0.2)

## 2021-02-24 LAB — BRAIN NATRIURETIC PEPTIDE: B Natriuretic Peptide: 326.4 pg/mL — ABNORMAL HIGH (ref 0.0–100.0)

## 2021-02-24 NOTE — Progress Notes (Addendum)
ADVANCED HF CLINIC NOTE  PCP: Clinton Quant at [facility] Cardiologist: Dr. Fransico Him HF cardiologist: Dr. Haroldine Laws  HPI: [name] is a 65 y/o woman with history of breast CA in 1998 treated with Adriamycin x 4 cycles. In 2007, diagnosed with CHF with EF 25-35%. She is s/p Medtronic BiV-ICD in 2008 with Sprint Fidelis lead which was subsequently replaced. LV function has subsequently recovered  Had cath 12/10. Coronaries normal. Right atrial pressure mean of 7, RV pressure 28/6 . Pulmonary artery pressure was 23/9 with mean of 15.  Pulmonary capillary wedge pressure mean of 9. Fick cardiac output 3.9 L per minute.  Cardiac index 2.2 L per minute per meter squared.  Pulmonary vascular resistance was 1.5 [place] units.  [place] 2019  RA = 11 RV = 27/5 PA = 24/3 (17) PCW = 9 Fick cardiac output/index = 4.8/2.5 PVR = 1.8 WU Ao sat = 98% PA sat = 66%, 67%  Echo 2/21 EF 50%-55% mild to moderate TR. Personally reviewed  In 9/21 admitted to [place] with Covid (despite vaccination/boosting) was there for 6 days and got better. In 12/21 started having falls. SBP in 50s. Found to be septic with AKI.  BCx negative. Stopped all HF meds and diuretics. Gained 10 pounds.  Echo done EF 35-40%  Seen for HF follow 09/13/20. Volume overloaded by ICD interrogation. Torsemide was increased to 60 mg bid, losartan and carvedilol restarted.   Has had more AF recently on device and started on Eliquis by Dr. Caryl Comes. Had inappropriate ICD shocks for AF.   Today she returns for HF follow up. Taking torsemide 40 bid and metolazone every Monday and Thursday. Fluid relatively well controlled. Breathing ok. Able to do ADLs without much problem. No edema, orthopnea or PND. SBP runs 95-105 at home.   Echo 12/12/20 EF 40-45% with dyssynchrony. RV ok Personally reviewed  ICD interrogation: Volume status improved. Mildly elevated but below baseline. No VT or recurrent AF. Activity level 2-3 hr/day  Personally reviewed  Recent potassium 2.7 -> 3.9  Studies: Echo 12/21: EF 35-40% (in setting of sepsis in [place]). Echo 2/21: EF 50%-55% mild to moderate TR Echo 9/19: EF 40-45% Echo 11/17: EF 50-55%  Echo 6/16: EF 45-50% Echo 11/15: EF 50% inferior HK GLS -17.8 Echo 8/14: EF 50% Echo 5/11: EF 50-55%  Echo 4/13 EF 50%. RV normal.  12/16/11 ICD generator replaced due to EOL  ROS: All systems negative except as listed in HPI, PMH and Problem List.  Past Medical History:  Diagnosis Date   AICD (automatic cardioverter/defibrillator) present    Biventricular implantable cardiac defibrillator)    Medtronic   Breast cancer [facility])    s/p mastectomy and chemo with adriamycin (1998)   CHF (congestive heart failure) ([place])    takes Lasix and Aldactone daily   GERD (gastroesophageal reflux disease)    takes Omeprazole daily   Gout    takes Allopurinol daily   History of bronchitis 2 yrs ago   History of shingles    Hyperlipidemia    takes Simvastatin daily   Insomnia    takes Ambien nightly   Nonischemic cardiomyopathy ([place])    EF 55% echo 2012   NSVT (nonsustained ventricular tachycardia) (HCC)    Obesity    OSA (obstructive sleep apnea)    moderate OSA with an AHi of 18.6/hr with no significant central sleep apnea and O2 sats as low as  81%.  Now on auto CPAP.     Pneumonia 49yrs ago   Presence of permanent cardiac pacemaker    Shortness of breath dyspnea    with exertion   sprint Fidelis     Current Outpatient Medications  Medication Sig Dispense Refill   albuterol (VENTOLIN HFA) 108 (90 Base) MCG/ACT inhaler Inhale 2 puffs into the lungs.     alendronate (FOSAMAX) 70 MG tablet Take 70 mg by mouth once a week.     allopurinol (ZYLOPRIM) 100 MG tablet Take 100 mg by mouth daily.      apixaban (ELIQUIS) 5 MG TABS tablet Take 1 tablet (5 mg total) by mouth 2 (two) times daily. 60 tablet 5   atorvastatin (LIPITOR) 10 MG tablet Take 10 mg by mouth at bedtime.     bisoprolol  (ZEBETA) 5 MG tablet Take 0.5 tablets (2.5 mg total) by mouth daily. 15 tablet 5   Cholecalciferol (VITAMIN D3) 5000 units TABS Take 5,000 Units by mouth daily.     citalopram (CELEXA) 20 MG tablet Take 20 mg by mouth daily.      empagliflozin (JARDIANCE) 10 MG TABS tablet Take 1 tablet (10 mg total) by mouth daily before breakfast. 30 tablet 11   metolazone (ZAROXOLYN) 2.5 MG tablet Take 1 tablet (2.5 mg total) by mouth 2 (two) times a week. On Mondays and Thursdays 24 tablet 4   Multiple Vitamins-Minerals (ONE DAILY FOR WOMEN 50+ ADV PO) Take by mouth.     potassium chloride SA (KLOR-CON M20) 20 MEQ tablet TAKE 1 TABLET DAILY BY MOUTH WITH ADDITIONAL 40MEQ WITH EVERY DOSE OF METOLAZONE 115 tablet 3   simvastatin (ZOCOR) 40 MG tablet TAKE 1 TABLET EVERY EVENING 90 tablet 1   spironolactone (ALDACTONE) 25 MG tablet TAKE 1 TABLET EVERY DAY 90 tablet 1   torsemide (DEMADEX) 20 MG tablet Take 40 mg by mouth 2 (two) times daily.     venlafaxine XR (EFFEXOR-XR) 150 MG 24 hr capsule Take 150 mg by mouth daily with breakfast.     vitamin B-12 (CYANOCOBALAMIN) 100 MCG tablet Take 150 mcg by mouth daily.      zolpidem (AMBIEN) 5 MG tablet Take 5 mg by mouth at bedtime as needed. For sleep     losartan (COZAAR) 25 MG tablet Take 1 tablet (25 mg total) by mouth daily. 90 tablet 3   No current facility-administered medications for this encounter.    Vitals:   02/24/21 1303  BP: 104/70  Pulse: 77  SpO2: 100%  Weight: 90.4 kg (199 lb 6.4 oz)   Wt Readings from Last 3 Encounters:  02/24/21 90.4 kg (199 lb 6.4 oz)  02/10/21 89.4 kg (197 lb 3.2 oz)  12/12/20 90 kg (198 lb 6.4 oz)   PHYSICAL EXAM: General:  Well appearing. No resp difficulty HEENT: normal Neck: supple. no JVD. Carotids 2+ bilat; no bruits. No lymphadenopathy or thryomegaly appreciated. Cor: PMI nondisplaced. Regular rate & rhythm. No rubs, gallops or murmurs. Lungs: clear Abdomen: obese soft, nontender, nondistended. No  hepatosplenomegaly. No bruits or masses. Good bowel sounds. Extremities: no cyanosis, clubbing, rash, edema Neuro: alert & orientedx3, cranial nerves grossly intact. moves all 4 extremities w/o difficulty. 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 EF 50-55% on echo 11/17 - Echo 9/19 EF 45% with septal dyssynergy (despite biv pacing). - Echo 2/21 EF 50-55% mild to moderate TR - RHC in 10/19 with relatively normal hemodynamics except for RA  10 (PCWP was 9)  - Echo 12/21 at [place] EF 35-40% in setting of sepsis. - Echo 12/12/20 EF 40-45% with dyssynchrony. RV ok Personally reviewed - Feels better today NYHA II - Volume status improved. Currently on torsemide 40 bid and metolazone every Monday and Thursday(with KCl 40). Will cotninue  - Continue spiro 25 mg daily. - Continue losartan 25 mg daily (was on 50 but reduced due to low BP). - Continue bisoprolol 2.5 daily - Continue Jardiance 10   - BP too low to titrate GDMT.  - Check labs today  2) Medtronic BiV ICD:  - ICD interrogated as above  3) HTN - Blood pressure on low end   4) Fatigue/snoring - Sleep study with moderate OSA. Following with Dr. Radford Pax. Unable to tolerate any CPAP. - Dr Radford Pax referred to ENT for [facility] Device.   5) PAF (SCAF) - CHA2DS2-VASc 4 - Has had more AF recently on device and started on Eliquis by Dr. Caryl Comes  6) Obesity Body mass index is 36.47 kg/m. - stable  7) CKD 3b - labs today - Continue losartan and Jardiance  Glori Bickers, MD  1:20 PM

## 2021-02-24 NOTE — Patient Instructions (Signed)
Labs done today, your results will be available in MyChart, we will contact you for abnormal readings.  Your physician recommends that you schedule a follow-up appointment in: 4 months  If you have any questions or concerns before your next appointment please send us a message through mychart or call our office at [phone].    TO LEAVE A MESSAGE FOR THE NURSE SELECT OPTION 2, PLEASE LEAVE A MESSAGE INCLUDING: . YOUR NAME . DATE OF BIRTH . CALL BACK NUMBER . REASON FOR CALL**this is important as we prioritize the call backs  YOU WILL RECEIVE A CALL BACK THE SAME DAY AS LONG AS YOU CALL BEFORE 4:00 PM  At the [facility], you and your health needs are our priority. As part of our continuing mission to provide you with exceptional heart care, we have created designated Provider Care Teams. These Care Teams include your primary Cardiologist (physician) and Advanced Practice Providers (APPs- Physician Assistants and Nurse Practitioners) who all work together to provide you with the care you need, when you need it.   You may see any of the following providers on your designated Care Team at your next follow up: . Dr Daniel Bensimhon . Dr Dalton McLean . Dr Brandon Winfrey . Amy Clegg, NP . Brittainy Simmons, PA . Jessica Milford,NP . Lauren Kemp, PharmD   Please be sure to bring in all your medications bottles to every appointment.     

## 2021-02-25 NOTE — Progress Notes (Signed)
EPIC Encounter for ICM Monitoring  Patient Name: [name] is a 65 y.o. female Date: 02/25/2021 Primary Care Physican: Wayna Chalet, NP Primary Cardiologist: [place] Electrophysiologist: Vergie Living Pacing: 99.3%         01/10/2021 Home Weight: 190 lbs   Time in AT/AF  <0.1 hr/day (<0.1%)                                                            Patient seen in office 02/24/2021 by Dr Haroldine Laws.   Optivol thoracic impedance suggesting fluid levels trending close to baseline.   Prescribed: Torsemide 20 mg take 40 mg twice a day. Metolazone 2.5 mg Take 2.5 mg by mouth on Monday and Thursdays Potassium 20 mEq take 1 tablet daily with additional 40 mEq with every Metolazone dose Spironolactone 25 mg take 1 tablet daily.   Labs: 02/24/2021 Creatinine 1.64, BUN 29, Potassium 3.4, Sodium 137 02/18/2021 Creatinine 1.66, BUN 21, Potassium 3.9, Sodium 142, GFR 34  02/10/2021 Creatinine 1.78, BUN 31, Potassium 2.7, Sodium 135, GFR 31  01/06/2021 Creatinine 1.77, BUN 30, Potassium 3.8, Sodium 139, GFR 31 12/24/2020 Creatinine 1.82, BUN 34, Potassium 3.4, Sodium 136, GFR 30  A complete set of results can be found in Results Review.   Recommendations:  Any recommendations given at 02/24/2021 OV with Dr Haroldine Laws.   Follow-up plan: ICM clinic phone appointment on 04/02/2021.   91 day device clinic remote transmission 04/01/2021.     EP/Cardiology Office Visits:  07/01/2021 with HF clinic PA/NP.  04/08/2021 with Dr Caryl Comes.   Copy of ICM check sent to Dr. Caryl Comes   3 month ICM trend: 02/24/2021.    1 Year ICM trend:       Rosalene Billings, RN 02/25/2021 4:05 PM

## 2021-02-28 ENCOUNTER — Encounter (HOSPITAL_COMMUNITY): Payer: Self-pay | Admitting: *Deleted

## 2021-03-25 ENCOUNTER — Telehealth: Payer: Self-pay
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
## 2021-03-25 NOTE — Telephone Encounter (Signed)
The patient states she got shocked 10 minutes ago. Pt denies chest pains, dizziness and SOB. She felt a little tired before the shock. The patient felt like a [place] kicked her in the chest. She describe seeing a bright light and hearing the shock but no one around her can her it. I had the patient send a transmission for the nurse to review.

## 2021-03-26 MED ORDER — BISOPROLOL FUMARATE 5 MG PO TABS: 5.0000 mg | ORAL_TABLET | Freq: Every day | ORAL | 1 refills | Status: DC

## 2021-03-26 NOTE — Telephone Encounter (Signed)
Dr Caryl Comes spoke with pt on 03/25/2021 and advised pt to increase her Zebeta to 5mg  - 1 tablet by mouth daily.

## 2021-03-31 ENCOUNTER — Telehealth: Payer: Self-pay | Admitting: Internal Medicine

## 2021-03-31 NOTE — Telephone Encounter (Signed)
Pt c/o medication issue:  1. Name of Medication: apixaban (ELIQUIS) 5 MG TABS tablet  2. How are you currently taking this medication (dosage and times per day)? Take 1 tablet (5 mg total) by mouth 2 (two) times daily.  3. Are you having a reaction (difficulty breathing--STAT)? no  4. What is your medication issue? Pt was advised to take 2 pills per day but pt is having bruising to arms and legs  and her cheeks are red
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
## 2021-03-31 NOTE — Telephone Encounter (Signed)
Pt is on correct dose of Eliquis given age, weight, and renal function. Unsure if changing to a different anticoagulant would help as other DOACs and warfarin all have higher risk of bleeding than Eliquis. Will defer to MD for input.

## 2021-04-01 ENCOUNTER — Ambulatory Visit (INDEPENDENT_AMBULATORY_CARE_PROVIDER_SITE_OTHER): Payer: Medicare PPO

## 2021-04-01 DIAGNOSIS — I428 Other cardiomyopathies: Secondary | ICD-10-CM | POA: Diagnosis not present

## 2021-04-01 LAB — CUP PACEART REMOTE DEVICE CHECK
Battery Remaining Longevity: 11 mo
Battery Voltage: 2.85 V
Brady Statistic AP VP Percent: 13.07 %
Brady Statistic AP VS Percent: 0.05 %
Brady Statistic AS VP Percent: 86.14 %
Brady Statistic AS VS Percent: 0.74 %
Brady Statistic RA Percent Paced: 12.98 %
Brady Statistic RV Percent Paced: 97.73 %
Date Time Interrogation Session: 20220726012501
HighPow Impedance: 82 Ohm
Implantable Lead Implant Date: 20070821
Implantable Lead Implant Date: 20070821
Implantable Lead Implant Date: 20160211
Implantable Lead Location: 753858
Implantable Lead Location: 753859
Implantable Lead Location: 753860
Implantable Lead Model: 4194
Implantable Lead Model: 5076
Implantable Lead Model: 6935
Implantable Pulse Generator Implant Date: 20160211
Lead Channel Impedance Value: 133 Ohm
Lead Channel Impedance Value: 380 Ohm
Lead Channel Impedance Value: 513 Ohm
Lead Channel Impedance Value: 589 Ohm
Lead Channel Impedance Value: 646 Ohm
Lead Channel Impedance Value: 760 Ohm
Lead Channel Pacing Threshold Amplitude: 0.375 V
Lead Channel Pacing Threshold Amplitude: 1.125 V
Lead Channel Pacing Threshold Amplitude: 1.5 V
Lead Channel Pacing Threshold Pulse Width: 0.4 ms
Lead Channel Pacing Threshold Pulse Width: 0.4 ms
Lead Channel Pacing Threshold Pulse Width: 0.4 ms
Lead Channel Sensing Intrinsic Amplitude: 1.75 mV
Lead Channel Sensing Intrinsic Amplitude: 1.75 mV
Lead Channel Sensing Intrinsic Amplitude: 21.125 mV
Lead Channel Sensing Intrinsic Amplitude: 21.125 mV
Lead Channel Setting Pacing Amplitude: 1.5 V
Lead Channel Setting Pacing Amplitude: 1.75 V
Lead Channel Setting Pacing Amplitude: 4 V
Lead Channel Setting Pacing Pulse Width: 0.4 ms
Lead Channel Setting Pacing Pulse Width: 0.4 ms
Lead Channel Setting Sensing Sensitivity: 0.3 mV

## 2021-04-02 ENCOUNTER — Ambulatory Visit (INDEPENDENT_AMBULATORY_CARE_PROVIDER_SITE_OTHER): Payer: Medicare PPO

## 2021-04-02 DIAGNOSIS — I5022 Chronic systolic (congestive) heart failure: Secondary | ICD-10-CM

## 2021-04-02 DIAGNOSIS — Z95810 Presence of automatic (implantable) cardiac defibrillator: Secondary | ICD-10-CM | POA: Diagnosis not present

## 2021-04-02 NOTE — Progress Notes (Signed)
Electrophysiology Office Note Date: 04/03/2021  ID:  Natasha, [name] 26-Mar-1956, MRN [id number]  PCP: Natasha Chalet, NP Primary Cardiologist: None Electrophysiologist: Natasha Axe, MD   CC: Routine ICD follow-up  [name] is a 65 y.o. female seen today for Natasha Axe, MD for acute visit due to bleeding on Eliquis and inappropriate shocks.   Since last being seen in our clinic the patient reports doing OK, just frustrated with everything going on. Since started on Eliquis she has had significant easy bruising on her arms. Small skin tear several days ago that continues to bleed. She has tachy-palpitations at times when heart racing.   Device History: Medtronic BiV ICD implanted 2007, Gen change 2013, gen change with RV lead revision 2016 (previous lead fractured and abandoned) for NICM  Past Medical History:  Diagnosis Date   AICD (automatic cardioverter/defibrillator) present    Biventricular implantable cardiac defibrillator)    Medtronic   Breast cancer [facility])    s/p mastectomy and chemo with adriamycin (1998)   CHF (congestive heart failure) ([place])    takes Lasix and Aldactone daily   GERD (gastroesophageal reflux disease)    takes Omeprazole daily   Gout    takes Allopurinol daily   History of bronchitis 2 yrs ago   History of shingles    Hyperlipidemia    takes Simvastatin daily   Insomnia    takes Ambien nightly   Nonischemic cardiomyopathy ([place])    EF 55% echo 2012   NSVT (nonsustained ventricular tachycardia) (HCC)    Obesity    OSA (obstructive sleep apnea)    moderate OSA with an AHi of 18.6/hr with no significant central sleep apnea and O2 sats as low as 81%.  Now on auto CPAP.     Pneumonia 42yrs ago   Presence of permanent cardiac pacemaker    Shortness of breath dyspnea    with exertion   sprint Fidelis    Past Surgical History:  Procedure Laterality Date   ABDOMINAL HYSTERECTOMY     APPENDECTOMY  1978   BiV ICD  2007    Medtronic   BIV ICD GENERTAOR CHANGE OUT N/A 12/16/2011   Procedure: BIV ICD GENERTAOR CHANGE OUT;  Surgeon: [name], MD;  Location: [facility] CATH LAB;  Service: Cardiovascular;  Laterality: N/A;   COLONOSCOPY     cyst removed from wrist Left    early 70's   ICD LEAD REMOVAL Right 10/18/2014   Procedure: ICD LEAD REMOVAL/EXTRACTION/REIMPLANTATION;  Surgeon: Evans Lance, MD;  Location: [place];  Service: Cardiovascular;  Laterality: Right;   MASTECTOMY Left 1998   port a cath placed  1998   port removed  [place] CATH N/A 06/20/2018   Procedure: RIGHT HEART CATH;  Surgeon: Jolaine Artist, MD;  Location: [place] CV LAB;  Service: Cardiovascular;  Laterality: N/A;    Current Outpatient Medications  Medication Sig Dispense Refill   albuterol (VENTOLIN HFA) 108 (90 Base) MCG/ACT inhaler Inhale 2 puffs into the lungs.     allopurinol (ZYLOPRIM) 100 MG tablet Take 100 mg by mouth daily.      atorvastatin (LIPITOR) 10 MG tablet Take 10 mg by mouth at bedtime.     bisoprolol (ZEBETA) 5 MG tablet Take 1 tablet (5 mg total) by mouth daily. 90 tablet 1   Cholecalciferol (VITAMIN D3) 5000 units TABS Take 5,000 Units by mouth daily.     citalopram (CELEXA) 20 MG tablet Take 20  mg by mouth daily.      empagliflozin (JARDIANCE) 10 MG TABS tablet Take 1 tablet (10 mg total) by mouth daily before breakfast. 30 tablet 11   losartan (COZAAR) 25 MG tablet Take 1 tablet (25 mg total) by mouth daily. 90 tablet 3   metolazone (ZAROXOLYN) 2.5 MG tablet Take 1 tablet (2.5 mg total) by mouth 2 (two) times a week. On Mondays and Thursdays 24 tablet 4   Multiple Vitamins-Minerals (ONE DAILY FOR WOMEN 50+ ADV PO) Take by mouth.     potassium chloride SA (KLOR-CON M20) 20 MEQ tablet TAKE 1 TABLET DAILY BY MOUTH WITH ADDITIONAL 40MEQ WITH EVERY DOSE OF METOLAZONE 115 tablet 3   Rivaroxaban (XARELTO) 15 MG TABS tablet Take 1 tablet (15 mg total) by mouth daily with supper. 30 tablet 0   simvastatin  (ZOCOR) 40 MG tablet TAKE 1 TABLET EVERY EVENING 90 tablet 1   spironolactone (ALDACTONE) 25 MG tablet TAKE 1 TABLET EVERY DAY 90 tablet 1   torsemide (DEMADEX) 20 MG tablet Take 40 mg by mouth 2 (two) times daily.     venlafaxine XR (EFFEXOR-XR) 150 MG 24 hr capsule Take 150 mg by mouth daily with breakfast.     vitamin B-12 (CYANOCOBALAMIN) 100 MCG tablet Take 150 mcg by mouth daily.      zolpidem (AMBIEN) 5 MG tablet Take 5 mg by mouth at bedtime as needed. For sleep     alendronate (FOSAMAX) 70 MG tablet Take 70 mg by mouth once a week. (Patient not taking: Reported on 04/03/2021)     No current facility-administered medications for this visit.    Allergies:   Buprenorphine hcl, Morphine and related, Percocet [oxycodone-acetaminophen], and Oxycodone   Social History: Social History   Socioeconomic History   Marital status: Married    Spouse name: Not on file   Number of children: Not on file   Years of education: Not on file   Highest education level: Not on file  Occupational History   Not on file  Tobacco Use   Smoking status: Never   Smokeless tobacco: Never  Vaping Use   Vaping Use: Never used  Substance and Sexual Activity   Alcohol use: No   Drug use: No   Sexual activity: Yes    Birth control/protection: None  Other Topics Concern   Not on file  Social History Narrative   Not on file   Social Determinants of Health   Financial Resource Strain: Not on file  Food Insecurity: Not on file  Transportation Needs: Not on file  Physical Activity: Not on file  Stress: Not on file  Social Connections: Not on file  Intimate Partner Violence: Not on file    Family History: Family History  Problem Relation Age of Onset   Diabetes Mother    Heart disease Father    Alzheimer's disease Father     Review of Systems: All other systems reviewed and are otherwise negative except as noted above.   Physical Exam: Vitals:   04/03/21 0912  BP: 108/68  Pulse: 98   SpO2: 99%  Weight: 193 lb 9.6 oz (87.8 kg)  Height: 5\' 2"  (1.575 m)     GEN- The patient is well appearing, alert and oriented x 3 today.   HEENT: normocephalic, atraumatic; sclera clear, conjunctiva pink; hearing intact; oropharynx clear; neck supple, no JVP Lymph- no cervical lymphadenopathy Lungs- Clear to ausculation bilaterally, normal work of breathing.  No wheezes, rales, rhonchi Heart- Regular rate and  rhythm, no murmurs, rubs or gallops, PMI not laterally displaced GI- soft, non-tender, non-distended, bowel sounds present, no hepatosplenomegaly Extremities- no clubbing or cyanosis. No edema; DP/PT/radial pulses 2+ bilaterally MS- no significant deformity or atrophy Skin- warm and dry, no rash or lesion; ICD pocket well healed Psych- euthymic mood, full affect Neuro- strength and sensation are intact  ICD interrogation- reviewed in detail today,  See PACEART report  EKG:  EKG is not ordered today.  Recent Labs: 02/10/2021: Magnesium 2.3 02/24/2021: B Natriuretic Peptide 326.4; BUN 29; Creatinine, Ser 1.64; Hemoglobin 11.8; Platelets 246; Potassium 3.4; Sodium 137   Wt Readings from Last 3 Encounters:  04/03/21 193 lb 9.6 oz (87.8 kg)  02/24/21 199 lb 6.4 oz (90.4 kg)  02/10/21 197 lb 3.2 oz (89.4 kg)     Other studies Reviewed: Additional studies/ records that were reviewed today include: Previous EP office notes and phone notes.   Assessment and Plan:  1.  Chronic systolic dysfunction s/p Medtronic CRT-D  euvolemic today Stable on an appropriate medical regimen Normal ICD function See Pace Art report No changes today Echo 12/2020 with LVEF 40-45%   2. Paroxysmal atrial fibrillation/Atrial flutter Very easy bleeding on Eliquis. ? If Watchman candidate long term. At this time continue appropriately dosed eliquis for CHA2DS2VASC of at least 4.  CrCl puts her at lower dose of Xarelto. Will STOP eliquis and start Xarelto 15 mg daily this am. Last dose Eliquis last  night.  Labs today.  Ultimately may need to consider amio vs tikosyn. QTc appears 440-470 range on previous EKGs when measured manually and corrected for for QRS. Cr clearance would also bump her down to tikosyn 250 mcg start.   3. Inappropriate therapies Multiple over the past several months with no appropriate shocks that I can see or that the patient is aware of.  With AF rates in the 190s at times, I have turned off her VT zone for now, leaving therapies at > 200 bpm as I cannot find evidence of appropriate therapies in the past.  Also turned on 1:1 discriminator  Increase bisoprolol to 7.5 mg daily.   4. Skin tear From bumping arm. Not bleeding currently Encouraged to keep open to air. Suspect she was interrupting clot formation by continual bandage placement and removal to check.   Current medicines are reviewed at length with the patient today.   The patient has concerns regarding her medicines in regards to very easy bruising on Eliquis. Change to Xarelto as CrCL qualifies for lower dose.   Labs/ tests ordered today include:  Orders Placed This Encounter  Procedures   Comprehensive metabolic panel   CBC   TSH    Disposition:   Follow up with EP APP  3-4 weeks.    Jacalyn Lefevre, PA-C  04/03/2021 10:40 AM  [facility] [address] [place] [address] [phone] (office) [phone] (fax)

## 2021-04-02 NOTE — Progress Notes (Signed)
EPIC Encounter for ICM Monitoring  Patient Name: [name] is a 65 y.o. female Date: 04/02/2021 Primary Care Physican: Wayna Chalet, NP Primary Cardiologist: [place] Electrophysiologist: Vergie Living Pacing: 97.7%         01/10/2021 Home Weight: 190 lbs   Time in AT/AF  <0.1 hr/day (<0.1%)                                                            Attempted call to patient and unable to reach.  Left detailed message per DPR regarding transmission. Transmission reviewed.    Optivol thoracic impedance suggesting fluid levels trending close to baseline.  Device shock received on 7/19 and addressed by Dr Caryl Comes.    Prescribed: Torsemide 20 mg take 40 mg twice a day. Metolazone 2.5 mg Take 2.5 mg by mouth on Monday and Thursdays Potassium 20 mEq take 1 tablet daily with additional 40 mEq with every Metolazone dose Spironolactone 25 mg take 1 tablet daily.   Labs: 02/24/2021 Creatinine 1.64, BUN 29, Potassium 3.4, Sodium 137 02/18/2021 Creatinine 1.66, BUN 21, Potassium 3.9, Sodium 142, GFR 34 02/10/2021 Creatinine 1.78, BUN 31, Potassium 2.7, Sodium 135, GFR 31  01/06/2021 Creatinine 1.77, BUN 30, Potassium 3.8, Sodium 139, GFR 31 12/24/2020 Creatinine 1.82, BUN 34, Potassium 3.4, Sodium 136, GFR 30  A complete set of results can be found in Results Review.   Recommendations:  Left voice mail with ICM number and encouraged to call if experiencing any fluid symptoms.   Follow-up plan: ICM clinic phone appointment on 05/05/2021.   91 day device clinic remote transmission 07/01/2021.     EP/Cardiology Office Visits:  04/03/2021 with Oda Kilts, [place].  07/01/2021 with HF clinic PA/NP.    Copy of ICM check sent to Dr. Caryl Comes.   3 month ICM trend: 04/02/2021.    1 Year ICM trend:       Rosalene Billings, RN 04/02/2021 9:39 AM

## 2021-04-02 NOTE — Telephone Encounter (Signed)
CTSP 1) AFib RVR >?> inappropriate shock 2) brusing a big deal reviewed relative risks and benefits for CHADSVASC score of >=3   gender age cardiomyopathy-- she would like to ponder  Recs 1) stop Apixoban  and begin Rivaroxaban as she will be on the 15 mg dose 2/2 renal function and maybe it will be better 2) increase rate control 3) reprogram ICD to reduce the risk of Afib shock

## 2021-04-02 NOTE — Telephone Encounter (Signed)
Called and spoke with patient and left recs  Please see

## 2021-04-03 ENCOUNTER — Encounter: Payer: Self-pay | Admitting: Student

## 2021-04-03 ENCOUNTER — Other Ambulatory Visit: Payer: Self-pay

## 2021-04-03 ENCOUNTER — Ambulatory Visit: Payer: Medicare PPO | Admitting: Student

## 2021-04-03 VITALS — BP 108/68 | HR 98 | Ht 62.0 in | Wt 193.6 lb

## 2021-04-03 DIAGNOSIS — I5022 Chronic systolic (congestive) heart failure: Secondary | ICD-10-CM

## 2021-04-03 DIAGNOSIS — Z95810 Presence of automatic (implantable) cardiac defibrillator: Secondary | ICD-10-CM | POA: Diagnosis not present

## 2021-04-03 DIAGNOSIS — G4733 Obstructive sleep apnea (adult) (pediatric): Secondary | ICD-10-CM | POA: Diagnosis not present

## 2021-04-03 DIAGNOSIS — I480 Paroxysmal atrial fibrillation: Secondary | ICD-10-CM

## 2021-04-03 LAB — CUP PACEART INCLINIC DEVICE CHECK
Battery Remaining Longevity: 9 mo
Battery Voltage: 2.85 V
Brady Statistic AP VP Percent: 6.2 %
Brady Statistic AP VS Percent: 0.03 %
Brady Statistic AS VP Percent: 63.91 %
Brady Statistic AS VS Percent: 29.87 %
Brady Statistic RA Percent Paced: 6.14 %
Brady Statistic RV Percent Paced: 69.29 %
Date Time Interrogation Session: 20220728104239
HighPow Impedance: 74 Ohm
Implantable Lead Implant Date: 20070821
Implantable Lead Implant Date: 20070821
Implantable Lead Implant Date: 20160211
Implantable Lead Location: 753858
Implantable Lead Location: 753859
Implantable Lead Location: 753860
Implantable Lead Model: 4194
Implantable Lead Model: 5076
Implantable Lead Model: 6935
Implantable Pulse Generator Implant Date: 20160211
Lead Channel Impedance Value: 171 Ohm
Lead Channel Impedance Value: 342 Ohm
Lead Channel Impedance Value: 494 Ohm
Lead Channel Impedance Value: 589 Ohm
Lead Channel Impedance Value: 627 Ohm
Lead Channel Impedance Value: 722 Ohm
Lead Channel Pacing Threshold Amplitude: 0.5 V
Lead Channel Pacing Threshold Amplitude: 1.125 V
Lead Channel Pacing Threshold Amplitude: 1.625 V
Lead Channel Pacing Threshold Pulse Width: 0.4 ms
Lead Channel Pacing Threshold Pulse Width: 0.4 ms
Lead Channel Pacing Threshold Pulse Width: 0.4 ms
Lead Channel Sensing Intrinsic Amplitude: 1.875 mV
Lead Channel Sensing Intrinsic Amplitude: 19.375 mV
Lead Channel Sensing Intrinsic Amplitude: 2.25 mV
Lead Channel Sensing Intrinsic Amplitude: 21.75 mV
Lead Channel Setting Pacing Amplitude: 1.5 V
Lead Channel Setting Pacing Amplitude: 2 V
Lead Channel Setting Pacing Amplitude: 3.75 V
Lead Channel Setting Pacing Pulse Width: 0.4 ms
Lead Channel Setting Pacing Pulse Width: 0.4 ms
Lead Channel Setting Sensing Sensitivity: 0.3 mV

## 2021-04-03 LAB — COMPREHENSIVE METABOLIC PANEL
ALT: 15 IU/L (ref 0–32)
AST: 18 IU/L (ref 0–40)
Albumin/Globulin Ratio: 2.1 (ref 1.2–2.2)
Albumin: 4.4 g/dL (ref 3.8–4.8)
Alkaline Phosphatase: 116 IU/L (ref 44–121)
BUN/Creatinine Ratio: 20 (ref 12–28)
BUN: 36 mg/dL — ABNORMAL HIGH (ref 8–27)
Bilirubin Total: 0.6 mg/dL (ref 0.0–1.2)
CO2: 27 mmol/L (ref 20–29)
Calcium: 9.7 mg/dL (ref 8.7–10.3)
Chloride: 98 mmol/L (ref 96–106)
Creatinine, Ser: 1.78 mg/dL — ABNORMAL HIGH (ref 0.57–1.00)
Globulin, Total: 2.1 g/dL (ref 1.5–4.5)
Glucose: 77 mg/dL (ref 65–99)
Potassium: 3.4 mmol/L — ABNORMAL LOW (ref 3.5–5.2)
Sodium: 142 mmol/L (ref 134–144)
Total Protein: 6.5 g/dL (ref 6.0–8.5)
eGFR: 31 mL/min/{1.73_m2} — ABNORMAL LOW (ref >59)

## 2021-04-03 LAB — CBC
Hematocrit: 37.4 % (ref 34.0–46.6)
Hemoglobin: 12.4 g/dL (ref 11.1–15.9)
MCH: 33.2 pg — ABNORMAL HIGH (ref 26.6–33.0)
MCHC: 33.2 g/dL (ref 31.5–35.7)
MCV: 100 fL — ABNORMAL HIGH (ref 79–97)
Platelets: 266 10*3/uL (ref 150–450)
RBC: 3.73 x10E6/uL — ABNORMAL LOW (ref 3.77–5.28)
RDW: 13.7 % (ref 11.7–15.4)
WBC: 14.9 10*3/uL — ABNORMAL HIGH (ref 3.4–10.8)

## 2021-04-03 LAB — TSH: TSH: 5.6 u[IU]/mL — ABNORMAL HIGH (ref 0.450–4.500)

## 2021-04-03 MED ORDER — RIVAROXABAN 15 MG PO TABS
15.0000 mg | ORAL_TABLET | Freq: Every day | ORAL | 0 refills | Status: DC
Start: 2021-04-03 — End: 2021-05-30

## 2021-04-03 NOTE — Patient Instructions (Signed)
Medication Instructions:  Your physician has recommended you make the following change in your medication:   STOP: Eliquis START: Xarelto 15mg  daily  *If you need a refill on your cardiac medications before your next appointment, please call your pharmacy*   Lab Work: TODAY: CMET, CBC, TSH  If you have labs (blood work) drawn today and your tests are completely normal, you will receive your results only by: [place] (if you have MyChart) OR A paper copy in the mail If you have any lab test that is abnormal or we need to change your treatment, we will call you to review the results.  Follow-Up: At [facility], you and your health needs are our priority.  As part of our continuing mission to provide you with exceptional heart care, we have created designated Provider Care Teams.  These Care Teams include your primary Cardiologist (physician) and Advanced Practice Providers (APPs -  Physician Assistants and Nurse Practitioners) who all work together to provide you with the care you need, when you need it.  Your next appointment:   As scheduled

## 2021-04-03 NOTE — Telephone Encounter (Signed)
Pt has appointment with Oda Kilts, PA-C 04/03/2021.

## 2021-04-04 ENCOUNTER — Other Ambulatory Visit: Payer: Self-pay

## 2021-04-04 DIAGNOSIS — I480 Paroxysmal atrial fibrillation: Secondary | ICD-10-CM

## 2021-04-04 MED ORDER — POTASSIUM CHLORIDE CRYS ER 20 MEQ PO TBCR: EXTENDED_RELEASE_TABLET | ORAL | 3 refills | Status: DC

## 2021-04-04 MED ORDER — BISOPROLOL FUMARATE 5 MG PO TABS: 7.5000 mg | ORAL_TABLET | Freq: Every day | ORAL | 3 refills | Status: DC

## 2021-04-04 NOTE — Telephone Encounter (Signed)
Called patient in regards to MyChart message. Advised her on recommendations from [place] to increase potassium to 40 mg daily and to take 80mg  on days that she takes metolazone. Patient verbalized understanding. She also states that [place] told her to increase bisprolol to 7.5 mg daily but this was not mentioned on her AVS. Per Andy's office note she should increase bisoprolol to 7.5 mg daily. Patient is aware. She also would like to have her labs drawn at her PCP due to living so far away. Advised that I would fax lab orders over to her PCP.

## 2021-04-07 ENCOUNTER — Telehealth: Payer: Self-pay

## 2021-04-07 NOTE — Telephone Encounter (Signed)
Called and spoke with patient, she is agreeable to appt 04/14/21 at 9:30 am with AFib Clinic.

## 2021-04-07 NOTE — Telephone Encounter (Signed)
Manual transmission reviewed, pt did receive a shock on 7/30, it appears similar to the inappropriate shocks she had in the past.  Pt seen on 7/28 for reprogramming and has an upcoming f/u appt on 8/18 with Oda Kilts, PA.    Pt meds include- Bisoprolol 7.5 mg daily (increased at 7/28 appt).  Metolazone 2.5 mg twice weekly on Monday and Thursday, Spironolactone 25 mg daily, Torsemide 40. Mg BID. Potassium 40 meq daily with additional 40 meq on days she takes Metolazone, Xarelto 15 mg daily.   Attempted to return pt phone call, no answer.  Left detailed message confirming shock and upcoming appt.  Provided with device clinic # to return call if further questions.

## 2021-04-07 NOTE — Telephone Encounter (Signed)
Patient called in stating she has gotten shocked 04/06/2021. She states she just felt a little dizzy right before it shocked her.

## 2021-04-07 NOTE — Telephone Encounter (Signed)
Pt is scheduled with Afib clinic 04/14/2021.

## 2021-04-08 ENCOUNTER — Encounter: Payer: Medicare PPO | Admitting: Internal Medicine

## 2021-04-14 ENCOUNTER — Encounter (HOSPITAL_COMMUNITY): Payer: Self-pay | Admitting: Physician Assistant

## 2021-04-14 ENCOUNTER — Ambulatory Visit (HOSPITAL_COMMUNITY)
Admission: RE | Admit: 2021-04-14 | Discharge: 2021-04-14 | Disposition: A | Discharge status: Home / Self Care | Payer: Medicare PPO | Source: Ambulatory Visit | Attending: Physician Assistant | Admitting: Physician Assistant

## 2021-04-14 ENCOUNTER — Other Ambulatory Visit: Payer: Self-pay

## 2021-04-14 VITALS — BP 102/72 | HR 79 | Ht 62.0 in | Wt 196.0 lb

## 2021-04-14 DIAGNOSIS — I4892 Unspecified atrial flutter: Secondary | ICD-10-CM | POA: Insufficient documentation

## 2021-04-14 DIAGNOSIS — I110 Hypertensive heart disease with heart failure: Secondary | ICD-10-CM | POA: Diagnosis not present

## 2021-04-14 DIAGNOSIS — Z885 Allergy status to narcotic agent status: Secondary | ICD-10-CM | POA: Diagnosis not present

## 2021-04-14 DIAGNOSIS — G4733 Obstructive sleep apnea (adult) (pediatric): Secondary | ICD-10-CM | POA: Diagnosis not present

## 2021-04-14 DIAGNOSIS — Z713 Dietary counseling and surveillance: Secondary | ICD-10-CM | POA: Diagnosis not present

## 2021-04-14 DIAGNOSIS — I5022 Chronic systolic (congestive) heart failure: Secondary | ICD-10-CM | POA: Insufficient documentation

## 2021-04-14 DIAGNOSIS — D6869 Other thrombophilia: Secondary | ICD-10-CM | POA: Diagnosis not present

## 2021-04-14 DIAGNOSIS — I480 Paroxysmal atrial fibrillation: Secondary | ICD-10-CM | POA: Diagnosis not present

## 2021-04-14 DIAGNOSIS — E669 Obesity, unspecified: Secondary | ICD-10-CM | POA: Diagnosis not present

## 2021-04-14 DIAGNOSIS — Z7901 Long term (current) use of anticoagulants: Secondary | ICD-10-CM | POA: Insufficient documentation

## 2021-04-14 DIAGNOSIS — Z6835 Body mass index (BMI) 35.0-35.9, adult: Secondary | ICD-10-CM | POA: Diagnosis not present

## 2021-04-14 DIAGNOSIS — Z8249 Family history of ischemic heart disease and other diseases of the circulatory system: Secondary | ICD-10-CM | POA: Insufficient documentation

## 2021-04-14 DIAGNOSIS — Z79899 Other long term (current) drug therapy: Secondary | ICD-10-CM | POA: Diagnosis not present

## 2021-04-14 NOTE — Progress Notes (Signed)
Primary Care Physician: Wayna Chalet, NP Primary Cardiologist: Dr Radford Pax Primary Electrophysiologist: Dr Caryl Comes [facility]: Dr Haroldine Laws  Referring Physician: Oda Kilts    [name] is a 65 y.o. female with a history of breast cancer, chronic systolic CHF s/p CRT-D, OSA, CKD, HTN, atrial fibrillation, and atrial fluter who presents for follow up in the [facility]. Patient is on Xarelto for a CHADS2VASC score of 4. Patient seen recently by EP for inappropriate shocks due to afib with RVR. She does have tachypalpitations when she is in afib. She also has concerns about bruising and bleeding skin tears due to anticoagulation.   Today, she denies symptoms of palpitations, chest pain, shortness of breath, orthopnea, PND, lower extremity edema, dizziness, presyncope, syncope, snoring, daytime somnolence, bleeding, or neurologic sequela. The patient is tolerating medications without difficulties and is otherwise without complaint today.    Atrial Fibrillation Risk Factors:  she does have symptoms or diagnosis of sleep apnea. she is compliant with CPAP therapy. she does not have a history of rheumatic fever.   she has a BMI of Body mass index is 35.85 kg/m.Marland Kitchen Filed Weights   04/14/21 0939  Weight: 88.9 kg    Family History  Problem Relation Age of Onset   Diabetes Mother    Heart disease Father    Alzheimer's disease Father      Atrial Fibrillation Management history:  Previous antiarrhythmic drugs: none Previous cardioversions: none Previous ablations: none CHADS2VASC score: 4 Anticoagulation history: Eliquis, Xarelto   Past Medical History:  Diagnosis Date   AICD (automatic cardioverter/defibrillator) present    Biventricular implantable cardiac defibrillator)    Medtronic   Breast cancer ([place])    s/p mastectomy and chemo with adriamycin (1998)   CHF (congestive heart failure) ([place])    takes Lasix and Aldactone daily   GERD  (gastroesophageal reflux disease)    takes Omeprazole daily   Gout    takes Allopurinol daily   History of bronchitis 2 yrs ago   History of shingles    Hyperlipidemia    takes Simvastatin daily   Insomnia    takes Ambien nightly   Nonischemic cardiomyopathy ([place])    EF 55% echo 2012   NSVT (nonsustained ventricular tachycardia) ([facility])    Obesity    OSA (obstructive sleep apnea)    moderate OSA with an AHi of 18.6/hr with no significant central sleep apnea and O2 sats as low as 81%.  Now on auto CPAP.     Pneumonia 66yrs ago   Presence of permanent cardiac pacemaker    Shortness of breath dyspnea    with exertion   sprint Fidelis    Past Surgical History:  Procedure Laterality Date   ABDOMINAL HYSTERECTOMY     APPENDECTOMY  1978   BiV ICD  2007   Medtronic   BIV ICD GENERTAOR CHANGE OUT N/A 12/16/2011   Procedure: BIV ICD GENERTAOR CHANGE OUT;  Surgeon: [name], MD;  Location: [facility] CATH LAB;  Service: Cardiovascular;  Laterality: N/A;   COLONOSCOPY     cyst removed from wrist Left    early 70's   ICD LEAD REMOVAL Right 10/18/2014   Procedure: ICD LEAD REMOVAL/EXTRACTION/REIMPLANTATION;  Surgeon: Evans Lance, MD;  Location: [facility];  Service: Cardiovascular;  Laterality: Right;   MASTECTOMY Left 1998   port a cath placed  1998   port removed  [place] CATH N/A 06/20/2018   Procedure: RIGHT HEART  CATH;  Surgeon: Jolaine Artist, MD;  Location: [facility] CV LAB;  Service: Cardiovascular;  Laterality: N/A;    Current Outpatient Medications  Medication Sig Dispense Refill   albuterol (VENTOLIN HFA) 108 (90 Base) MCG/ACT inhaler Inhale 2 puffs into the lungs.     alendronate (FOSAMAX) 70 MG tablet Take 70 mg by mouth once a week.     allopurinol (ZYLOPRIM) 100 MG tablet Take 100 mg by mouth daily.      atorvastatin (LIPITOR) 10 MG tablet Take 10 mg by mouth at bedtime.     bisoprolol (ZEBETA) 5 MG tablet Take 1.5 tablets (7.5 mg total) by mouth daily. 135  tablet 3   Cholecalciferol (VITAMIN D3) 5000 units TABS Take 5,000 Units by mouth daily.     citalopram (CELEXA) 20 MG tablet Take 20 mg by mouth daily.      empagliflozin (JARDIANCE) 10 MG TABS tablet Take 1 tablet (10 mg total) by mouth daily before breakfast. 30 tablet 11   losartan (COZAAR) 25 MG tablet Take 1 tablet (25 mg total) by mouth daily. 90 tablet 3   methylPREDNISolone (MEDROL DOSEPAK) 4 MG TBPK tablet Take 4 mg by mouth.     metolazone (ZAROXOLYN) 2.5 MG tablet Take 1 tablet (2.5 mg total) by mouth 2 (two) times a week. On Mondays and Thursdays 24 tablet 4   Multiple Vitamins-Minerals (ONE DAILY FOR WOMEN 50+ ADV PO) Take by mouth.     orphenadrine (NORFLEX) 100 MG tablet Take 100 mg by mouth every 12 (twelve) hours.     potassium chloride SA (KLOR-CON M20) 20 MEQ tablet TAKE 2 TABLET DAILY BY MOUTH WITH ADDITIONAL 40MEQ WITH EVERY DOSE OF METOLAZONE 204 tablet 3   Rivaroxaban (XARELTO) 15 MG TABS tablet Take 1 tablet (15 mg total) by mouth daily with supper. 30 tablet 0   simvastatin (ZOCOR) 40 MG tablet TAKE 1 TABLET EVERY EVENING 90 tablet 1   spironolactone (ALDACTONE) 25 MG tablet TAKE 1 TABLET EVERY DAY 90 tablet 1   torsemide (DEMADEX) 20 MG tablet Take 40 mg by mouth 2 (two) times daily.     traMADol-acetaminophen (ULTRACET) 37.5-325 MG tablet Take 1 tablet by mouth every 4 (four) hours as needed.     venlafaxine XR (EFFEXOR-XR) 150 MG 24 hr capsule Take 150 mg by mouth daily with breakfast.     vitamin B-12 (CYANOCOBALAMIN) 100 MCG tablet Take 150 mcg by mouth daily.      zolpidem (AMBIEN) 5 MG tablet Take 5 mg by mouth at bedtime as needed. For sleep     No current facility-administered medications for this encounter.    Allergies  Allergen Reactions   Buprenorphine Hcl     Patient required emergent use of naloxone and naloxone drip in ICU for only taking Percocet 5-325 over the course of one day. She appears to have impaired metabolism of opioid medications and  providers should use these medications with extreme caution.   Morphine And Related Other (See Comments)    Patient required emergent use of naloxone and naloxone drip in ICU for only taking Percocet 5-325 over the course of one day. She appears to have impaired metabolism of opioid medications and providers should use these medications with extreme caution. Patient required emergent use of naloxone and naloxone drip in ICU for only taking Percocet 5-325 over the course of one day. She appears to have impaired metabolism of opioid medications and providers should use these medications with extreme caution.   Percocet [  Oxycodone-Acetaminophen] Shortness Of Breath    Hospitalized   Oxycodone Other (See Comments)    Mental status changes    Social History   Socioeconomic History   Marital status: Married    Spouse name: Not on file   Number of children: Not on file   Years of education: Not on file   Highest education level: Not on file  Occupational History   Not on file  Tobacco Use   Smoking status: Never   Smokeless tobacco: Never  Vaping Use   Vaping Use: Never used  Substance and Sexual Activity   Alcohol use: No   Drug use: No   Sexual activity: Yes    Birth control/protection: None  Other Topics Concern   Not on file  Social History Narrative   Not on file   Social Determinants of Health   Financial Resource Strain: Not on file  Food Insecurity: Not on file  Transportation Needs: Not on file  Physical Activity: Not on file  Stress: Not on file  Social Connections: Not on file  Intimate Partner Violence: Not on file     ROS- All systems are reviewed and negative except as per the HPI above.  Physical Exam: Vitals:   04/14/21 0939  BP: 102/72  Pulse: 79  Weight: 88.9 kg  Height: 5\' 2"  (1.575 m)    GEN- The patient is a well appearing obese female, alert and oriented x 3 today.   Head- normocephalic, atraumatic Eyes-  Sclera clear, conjunctiva pink Ears-  hearing intact Oropharynx- clear Neck- supple  Lungs- Clear to ausculation bilaterally, normal work of breathing Heart- Regular rate and rhythm, no murmurs, rubs or gallops  GI- soft, NT, ND, + BS Extremities- no clubbing, cyanosis, or edema MS- no significant deformity or atrophy Skin- ecchymosis on dorsum of forearm bilaterally Psych- euthymic mood, full affect Neuro- strength and sensation are intact  Wt Readings from Last 3 Encounters:  04/14/21 88.9 kg  04/03/21 87.8 kg  02/24/21 90.4 kg    EKG today demonstrates  Vent. rate 79 BPM PR interval 136 ms QRS duration 136 ms QT/QTcB 428/490 ms  Echo 12/12/20 demonstrated  1. Left ventricular ejection fraction, by estimation, is 40 to 45%. The  left ventricle has mildly decreased function. The left ventricle  demonstrates regional wall motion abnormalities. Abnormal (paradoxical) septal motion, consistent with RV pacemaker.   Left ventricular diastolic parameters are indeterminate.   2. Right ventricular systolic function is normal. The right ventricular  size is moderately enlarged. There is normal pulmonary artery systolic pressure.   3. Right atrial size was severely dilated.   4. The mitral valve is normal in structure. Trivial mitral valve  regurgitation.   5. The tricuspid valve is abnormal. Tricuspid valve regurgitation is  moderate. Septal leaflet prolapse with eccentric anterior directed jet,  may be underestimating degree of TR given eccentric jet.   6. The aortic valve is tricuspid. Aortic valve regurgitation is not  visualized. No aortic stenosis is present.   7. The inferior vena cava is dilated in size with >50% respiratory  variability, suggesting right atrial pressure of 8 mmHg.   Epic records are reviewed at length today  CHA2DS2-VASc Score = 4  The patient's score is based upon: CHF History: Yes HTN History: Yes Diabetes History: No Stroke History: No Vascular Disease History: No Age Score: 1 Gender  Score: 1     ASSESSMENT AND PLAN: 1. Paroxysmal Atrial Fibrillation/atrial flutter The patient's CHA2DS2-VASc score is 4,  indicating a 4.8% annual risk of stroke.   We discussed therapeutic options today including AAD (dofetilide, amiodarone) vs ablation. She would need to stop metolazone and citalopram for dofetilide. Patient would like to discuss afib ablation. She is also very interested in Watchman device given her significant arm bruising on anticoagulation. Will refer patient to Dr Quentin Ore to consider staged ablation and Watchman.  Continue bisoprolol 7.5 mg daily Continue Xarelto 15 mg daily  2. Secondary Hypercoagulable State (ICD10:  D68.69) The patient is at significant risk for stroke/thromboembolism based upon her CHA2DS2-VASc Score of 4.  Continue Rivaroxaban (Xarelto).   3. Obesity Body mass index is 35.85 kg/m. Lifestyle modification was discussed at length including regular exercise and weight reduction.  4. Obstructive sleep apnea The importance of adequate treatment of sleep apnea was discussed today in order to improve our ability to maintain sinus rhythm long term. Encouraged compliance with CPAP therapy.  5. Chronic systolic CHF S/p CRT-D No signs or symptoms of fluid overload.  6. HTN Stable, no changes today.   Follow up with Dr Quentin Ore to consider ablation/Watchman.   [facility] [address] [place], [place] [address] [phone] 04/14/2021 9:24 PM

## 2021-04-14 NOTE — Telephone Encounter (Signed)
Seeing RF for AAD consideration   I also reached out to DR DB re dig and also the echo report showing severe RAE with normal LA size

## 2021-04-18 ENCOUNTER — Ambulatory Visit: Payer: Medicare PPO | Admitting: Internal Medicine

## 2021-04-18 ENCOUNTER — Other Ambulatory Visit: Payer: Self-pay

## 2021-04-18 ENCOUNTER — Encounter: Payer: Self-pay | Admitting: Internal Medicine

## 2021-04-18 VITALS — BP 110/74 | HR 89 | Ht 62.0 in | Wt 198.0 lb

## 2021-04-18 DIAGNOSIS — I480 Paroxysmal atrial fibrillation: Secondary | ICD-10-CM

## 2021-04-18 MED ORDER — AMIODARONE HCL 200 MG PO TABS: ORAL_TABLET | ORAL | 3 refills | Status: DC

## 2021-04-18 NOTE — Progress Notes (Addendum)
Patient Care Team: Wayna Chalet, NP as PCP - General (Family Medicine) Sueanne Margarita, MD as PCP - Sleep Medicine (Cardiology) [name], MD as PCP - Electrophysiology (Cardiology)   HPI  [name] is a 65 y.o. female seen today in follow-up for congestive heart failure in the setting of nonischemic Gcardiomyopathy. She is status post CRTD implantation about 4 years ago.She has a sprint Fidelis lead she reached ERI in Aprill 2013 In 2/16 she fractured her lead and underwent extraction and ICD lead reimplantation; interval normalization of LV function  . Interval atrial fibrillation and started on anticoagulation>> inappropriate shock.  She was having significant bruising on the Eliquis.  We changed rivaroxaban as she qualify for the lower dose secondary to renal function.  Hopeful that she will tolerate it better.  Device reprogrammed to eliminates the VT zone leaving therapies only greater than 200 bpm; however, not withstanding significant and inappropriate shocks for rapid atrial fibrillation with conducted beats up to 240 ms  ? watchman  She underwent catheterization 12/10 , coronaries were normal and filling pressures were normal  DATE TEST EF   7//15 Echo   40-445 %   2/21 Echo  50-55%   4/22 Echo  40-45% RAE Severe//LA size normal       Medical history notable for breast CA in 1998 treated with Adriamycin x 4 cycles.   The patient denies chest pain, shortness of breath, nocturnal dyspnea, orthopnea or peripheral edema.  There have been no palpitations, lightheadedness or syncope.   Date Cr K Hgb  11/17  1.09 3.5   7/22 1.78 3.4 12.4     Past Medical History:  Diagnosis Date   AICD (automatic cardioverter/defibrillator) present    Biventricular implantable cardiac defibrillator)    Medtronic   Breast cancer [facility])    s/p mastectomy and chemo with adriamycin (1998)   CHF (congestive heart failure) ([place])    takes Lasix and Aldactone daily   GERD  (gastroesophageal reflux disease)    takes Omeprazole daily   Gout    takes Allopurinol daily   History of bronchitis 2 yrs ago   History of shingles    Hyperlipidemia    takes Simvastatin daily   Insomnia    takes Ambien nightly   Nonischemic cardiomyopathy ([place])    EF 55% echo 2012   NSVT (nonsustained ventricular tachycardia) (H[facility])    Obesity    OSA (obstructive sleep apnea)    moderate OSA with an AHi of 18.6/hr with no significant central sleep apnea and O2 sats as low as 81%.  Now on auto CPAP.     Pneumonia 65yrs ago   Presence of permanent cardiac pacemaker    Shortness of breath dyspnea    with exertion   sprint Fidelis     Past Surgical History:  Procedure Laterality Date   ABDOMINAL HYSTERECTOMY     APPENDECTOMY  1978   BiV ICD  2007   Medtronic   BIV ICD GENERTAOR CHANGE OUT N/A 12/16/2011   Procedure: BIV ICD GENERTAOR CHANGE OUT;  Surgeon: [name], MD;  Location[facility] CATH LAB;  Service: Cardiovascular;  Laterality: N/A;   COLONOSCOPY     cyst removed from wrist Left    early 70's   ICD LEAD REMOVAL Right 10/18/2014   Procedure: ICD LEAD REMOVAL/EXTRACTION/REIMPLANTATION;  Surgeon: Evans Lance, MD;  Location: [place];  Service: Cardiovascular;  Laterality: Right;   MASTECTOMY Left 1998   port a cath  placed  1998   port removed  [place] CATH N/A 06/20/2018   Procedure: RIGHT HEART CATH;  Surgeon: Jolaine Artist, MD;  Location: [place] CV LAB;  Service: Cardiovascular;  Laterality: N/A;    Current Outpatient Medications  Medication Sig Dispense Refill   albuterol (VENTOLIN HFA) 108 (90 Base) MCG/ACT inhaler Inhale 2 puffs into the lungs.     alendronate (FOSAMAX) 70 MG tablet Take 70 mg by mouth once a week.     allopurinol (ZYLOPRIM) 100 MG tablet Take 100 mg by mouth daily.      atorvastatin (LIPITOR) 10 MG tablet Take 10 mg by mouth at bedtime.     bisoprolol (ZEBETA) 5 MG tablet Take 1.5 tablets (7.5 mg total) by mouth daily.  135 tablet 3   Cholecalciferol (VITAMIN D3) 5000 units TABS Take 5,000 Units by mouth daily.     citalopram (CELEXA) 20 MG tablet Take 20 mg by mouth daily.      empagliflozin (JARDIANCE) 10 MG TABS tablet Take 1 tablet (10 mg total) by mouth daily before breakfast. 30 tablet 11   losartan (COZAAR) 25 MG tablet Take 1 tablet (25 mg total) by mouth daily. 90 tablet 3   metolazone (ZAROXOLYN) 2.5 MG tablet Take 1 tablet (2.5 mg total) by mouth 2 (two) times a week. On Mondays and Thursdays 24 tablet 4   Multiple Vitamins-Minerals (ONE DAILY FOR WOMEN 50+ ADV PO) Take by mouth.     orphenadrine (NORFLEX) 100 MG tablet Take 100 mg by mouth every 12 (twelve) hours.     potassium chloride SA (KLOR-CON M20) 20 MEQ tablet TAKE 2 TABLET DAILY BY MOUTH WITH ADDITIONAL 40MEQ WITH EVERY DOSE OF METOLAZONE 204 tablet 3   Rivaroxaban (XARELTO) 15 MG TABS tablet Take 1 tablet (15 mg total) by mouth daily with supper. 30 tablet 0   spironolactone (ALDACTONE) 25 MG tablet TAKE 1 TABLET EVERY DAY 90 tablet 1   torsemide (DEMADEX) 20 MG tablet Take 40 mg by mouth 2 (two) times daily.     venlafaxine XR (EFFEXOR-XR) 150 MG 24 hr capsule Take 150 mg by mouth daily with breakfast.     vitamin B-12 (CYANOCOBALAMIN) 100 MCG tablet Take 150 mcg by mouth daily.      zolpidem (AMBIEN) 5 MG tablet Take 5 mg by mouth at bedtime as needed. For sleep     methylPREDNISolone (MEDROL DOSEPAK) 4 MG TBPK tablet Take 4 mg by mouth.     simvastatin (ZOCOR) 40 MG tablet TAKE 1 TABLET EVERY EVENING 90 tablet 1   traMADol-acetaminophen (ULTRACET) 37.5-325 MG tablet Take 1 tablet by mouth every 4 (four) hours as needed.     No current facility-administered medications for this visit.    Allergies  Allergen Reactions   Buprenorphine Hcl     Patient required emergent use of naloxone and naloxone drip in ICU for only taking Percocet 5-325 over the course of one day. She appears to have impaired metabolism of opioid medications and  providers should use these medications with extreme caution.   Morphine And Related Other (See Comments)    Patient required emergent use of naloxone and naloxone drip in ICU for only taking Percocet 5-325 over the course of one day. She appears to have impaired metabolism of opioid medications and providers should use these medications with extreme caution. Patient required emergent use of naloxone and naloxone drip in ICU for only taking Percocet 5-325 over the course of one day.  She appears to have impaired metabolism of opioid medications and providers should use these medications with extreme caution.   Percocet [Oxycodone-Acetaminophen] Shortness Of Breath    Hospitalized   Oxycodone Other (See Comments)    Mental status changes    Review of Systems negative except from HPI and PMH  Physical Exam BP 110/74   Pulse 89   Ht 5\' 2"  (1.575 m)   Wt 198 lb (89.8 kg)   SpO2 97%   BMI 36.21 kg/m  Well developed and well nourished in no acute distress HENT normal Neck supple with JVP-flat Clear Device pocket well healed; without hematoma or erythema.  There is no tethering  Regular rate and rhythm, 2/6 murmur Abd-soft with active BS No Clubbing cyanosis no  edema Skin-warm and dry A & Oriented  Grossly normal sensory and motor function  ECG ECG April 15, 2019 2P synchronous paced with an upright QRS lead V1 and QRS in lead I  Assessment and  Plan  Nonischemic cardiomyopathy-intercurrent resolution  Congestive heart failure-chronic systolic grade 2  Defibrillator lead failure with prior extraction  Potassium low  Renal insufficiency grade 3  Estimated Creatinine Clearance: 32 mL/min (A) (by C-G formula based on SCr of 1.78 mg/dL (H)).  Right atrial enlargement-severe--left atrial size normal  Atrial fibrillation with rapid rates and inappropriate shocks  Implantable defibrillator-CRT-Medtronic  The patient's device was interrogated.      Recurrent atrial fibrillation,  associated unfortunately with rapid rates and inappropriate ICD discharges.  This is true not withstanding bisoprolol up titration which is limited by blood pressure; hence, in the anticipation of a change overall strategies, we will add amiodarone in the short-term for rhythm and rate control.  We will start her on 400 mg twice daily x2 weeks then 200 mg twice daily and 200 mg a day.  I discussed with the patient the potential benefits of catheter ablation which she is inclined in the short-term.  I am concerned about the dissociation and atrial size with significant enlargement of the right atrium suggesting possible left to right volume shift.  We will start with a bubble study.  If this is unrevealing may need further evaluation to exclude partial anomalous pulmonary veins.  She would also like to discuss watchman as an alternative to anticoagulation if she is having significant bruisability although this is somewhat less with recent change from Eliquis to rivaroxaban which we will continue at 50 mg  In the event that after discussed with Dr. Quentin Ore it is elected not to pursue catheter ablation and I would discontinue the amiodarone and put her on dofetilide.  I have seen ASUNA PETH is a 66 y.o. female in the office today. The patient is felt to be a poor candidate for long-term anticoagulation because of severe bruising and falls.  Their CHADS-2-Vasc Score and unadjusted Ischemic Stroke Rate (% per year) is equal to 3.2 % stroke rate/year from a score of 3, necessitating a strategy of stroke prevention with either long-term oral anticoagulation or left atrial appendage occlusion therapy. We have discussed their bleeding risk in the context of their comorbid medical problems, as well as the rationale for referral for evaluation of Watchman left atrial appendage occlusion therapy. While the patient is at high long-term bleeding risk, they may be appropriate for short-term anticoagulation. Based  on this individual patient's stroke and bleeding risk, a shared decision has been made to refer the patient for consideration of Watchman left atrial appendage closure utilizing the [employer] of Cardiology shared decision tool.

## 2021-04-18 NOTE — Patient Instructions (Addendum)
Medication Instructions:  Your physician has recommended you make the following change in your medication:   Begin Amiodarone:  -400mg  (2 tabs) 2 times per day from August 13-Aug 26 -200mg  (1 tab) 2 times per day from August 27- Sept 9 -200mg  (1 tab) 1 time per day from Sept 10 and onward.    Labwork: You will have labs drawn today: BMP   Testing/Procedures: Echo with a bubble study  Follow-Up: Your physician recommends that you schedule a follow-up appointment in:   A scheduler from [facility] will call you to set up an appointment with Dr. Quentin Ore to discuss Watchman and Atrial Fib Ablation   Any Other Special Instructions Will Be Listed Below (If Applicable).     If you need a refill on your cardiac medications before your next appointment, please call your pharmacy.

## 2021-04-19 LAB — BASIC METABOLIC PANEL
BUN/Creatinine Ratio: 21 (ref 12–28)
BUN: 39 mg/dL — ABNORMAL HIGH (ref 8–27)
CO2: 26 mmol/L (ref 20–29)
Calcium: 9.4 mg/dL (ref 8.7–10.3)
Chloride: 97 mmol/L (ref 96–106)
Creatinine, Ser: 1.83 mg/dL — ABNORMAL HIGH (ref 0.57–1.00)
Glucose: 111 mg/dL — ABNORMAL HIGH (ref 65–99)
Potassium: 3.3 mmol/L — ABNORMAL LOW (ref 3.5–5.2)
Sodium: 138 mmol/L (ref 134–144)
eGFR: 30 mL/min/{1.73_m2} — ABNORMAL LOW (ref >59)

## 2021-04-23 ENCOUNTER — Telehealth: Payer: Self-pay | Admitting: *Deleted

## 2021-04-23 NOTE — Telephone Encounter (Signed)
   [facility] Pre-operative Risk Assessment    Patient Name: [name]  DOB: 1955-10-08 MRN: [id number]  HEARTCARE STAFF:  - IMPORTANT!!!!!! Under Visit Info/Reason for Call, type in Other and utilize the format Clearance MM/DD/YY or Clearance TBD. Do not use dashes or single digits. - Please review there is not already an duplicate clearance open for this procedure. - If request is for dental extraction, please clarify the # of teeth to be extracted. - If the patient is currently at the dentist's office, call Pre-Op Callback Staff (MA/nurse) to input urgent request.  - If the patient is not currently in the dentist office, please route to the Pre-Op pool.  Request for surgical clearance:  What type of surgery is being performed?  KYPHOPLASTY  When is this surgery scheduled?  TBD  What type of clearance is required (medical clearance vs. Pharmacy clearance to hold med vs. Both)?  BOTH  Are there any medications that need to be held prior to surgery and how long?  [facility]  Practice name and name of physician performing surgery?  [facility] / DR. DAWLEY  What is the office phone number?  [phone]   7.   What is the office fax number?  [phone] ATTN  [id number]  8.   Anesthesia type (None, local, MAC, general) ?  GENERAL   Jeanann Lewandowsky 04/23/2021, 3:45 PM  _________________________________________________________________   (provider comments below)

## 2021-04-24 ENCOUNTER — Encounter: Payer: Medicare PPO | Admitting: Student

## 2021-04-24 ENCOUNTER — Other Ambulatory Visit: Payer: Self-pay | Admitting: *Deleted

## 2021-04-24 NOTE — Telephone Encounter (Signed)
Patient with diagnosis of afib on Xarelto for anticoagulation.    Procedure: kyphoplasty Date of procedure: TBD  CHA2DS2-VASc Score = 3  This indicates a 3.2% annual risk of stroke. The patient's score is based upon: CHF History: Yes HTN History: No Diabetes History: No Stroke History: No Vascular Disease History: No Age Score: 1 Gender Score: 1   CrCl 5mL/min using adjusted body weight due to obesity Platelet count 266K  Per office protocol, patient can hold Xarelto for 3 days prior to procedure, however will need to clarify ablation/Watchman plans first. If she has ablation, will not be able to interrupt anticoagulation for 3 weeks prior and 3 months after procedure.

## 2021-04-24 NOTE — Telephone Encounter (Signed)
ERROR

## 2021-04-25 ENCOUNTER — Telehealth: Payer: Self-pay

## 2021-04-25 MED ORDER — POTASSIUM CHLORIDE CRYS ER 20 MEQ PO TBCR: 60.0000 meq | EXTENDED_RELEASE_TABLET | Freq: Every day | ORAL | 3 refills | Status: DC

## 2021-04-25 NOTE — Telephone Encounter (Signed)
Spoke with pt and advised per Dr Caryl Comes labs show K+ remains low.  Pt confirms she is taking Spironolactone as prescribed.   Pt advised she will need to increase her KCL from 59meq to 16meq x 3 days then begin 47meq daily thereafter. Will need BMET in 2 weeks.  Pt states she will have BMET drawn at her PCP and requests order be mailed to her residence.  Address verified and order placed in mail to pt requesting PCP office fax results to [phone].   Pt verbalizes understanding and agrees with current plan.

## 2021-04-25 NOTE — Telephone Encounter (Signed)
-----   Message from [name], MD sent at 04/19/2021  2:36 PM EDT ----- Please Inform Patient that labs are normal x stable abnormal renal function and still low K Please make sure she is taking her spiro and we will need to increase her K from 40 meq daily to 80 for 3 days and then increase to 60 daily with recheck BMET in 2 weeks  Thanks

## 2021-04-28 ENCOUNTER — Other Ambulatory Visit: Payer: Self-pay | Admitting: Neurological Surgery

## 2021-04-28 NOTE — Progress Notes (Signed)
Remote ICD transmission.   

## 2021-05-01 ENCOUNTER — Other Ambulatory Visit (HOSPITAL_COMMUNITY): Payer: Self-pay | Admitting: Family Medicine

## 2021-05-05 NOTE — Telephone Encounter (Signed)
Patient has a visit with Dr. Quentin Ore scheduled for tomorrow 05/06/2021 to discuss possible ablation/Watchman device. Will forward this to Dr. Quentin Ore so that he is aware and can comment on timing of surgery. Will leave and pre-op pool and can follow-up on this after EP visit.  Darreld Mclean, PA-C 05/05/2021 8:12 AM

## 2021-05-06 ENCOUNTER — Encounter: Payer: Self-pay | Admitting: Cardiology

## 2021-05-06 ENCOUNTER — Encounter: Payer: Self-pay | Admitting: *Deleted

## 2021-05-06 ENCOUNTER — Telehealth: Payer: Self-pay | Admitting: Cardiology

## 2021-05-06 ENCOUNTER — Ambulatory Visit: Payer: Medicare PPO | Admitting: Cardiology

## 2021-05-06 ENCOUNTER — Other Ambulatory Visit: Payer: Self-pay

## 2021-05-06 VITALS — BP 96/74 | HR 85 | Ht 61.0 in | Wt 200.0 lb

## 2021-05-06 DIAGNOSIS — E669 Obesity, unspecified: Secondary | ICD-10-CM | POA: Diagnosis not present

## 2021-05-06 DIAGNOSIS — I5022 Chronic systolic (congestive) heart failure: Secondary | ICD-10-CM

## 2021-05-06 DIAGNOSIS — G4733 Obstructive sleep apnea (adult) (pediatric): Secondary | ICD-10-CM

## 2021-05-06 DIAGNOSIS — I480 Paroxysmal atrial fibrillation: Secondary | ICD-10-CM

## 2021-05-06 DIAGNOSIS — I428 Other cardiomyopathies: Secondary | ICD-10-CM | POA: Diagnosis not present

## 2021-05-06 DIAGNOSIS — I4819 Other persistent atrial fibrillation: Secondary | ICD-10-CM

## 2021-05-06 LAB — BASIC METABOLIC PANEL
BUN/Creatinine Ratio: 11 — ABNORMAL LOW (ref 12–28)
BUN: 24 mg/dL (ref 8–27)
CO2: 25 mmol/L (ref 20–29)
Calcium: 10.3 mg/dL (ref 8.7–10.3)
Chloride: 96 mmol/L (ref 96–106)
Creatinine, Ser: 2.15 mg/dL — ABNORMAL HIGH (ref 0.57–1.00)
Glucose: 110 mg/dL — ABNORMAL HIGH (ref 65–99)
Potassium: 4.6 mmol/L (ref 3.5–5.2)
Sodium: 138 mmol/L (ref 134–144)
eGFR: 25 mL/min/{1.73_m2} — ABNORMAL LOW (ref >59)

## 2021-05-06 MED ORDER — METOPROLOL TARTRATE 25 MG PO TABS: 25.0000 mg | ORAL_TABLET | Freq: Once | ORAL | 0 refills | Status: DC | PRN

## 2021-05-06 NOTE — Patient Instructions (Addendum)
Medication Instructions:  Your physician recommends that you continue on your current medications as directed. Please refer to the Current Medication list given to you today. *If you need a refill on your cardiac medications before your next appointment, please call your pharmacy*   Lab Work: You will get lab work today:  BMP   If you have labs (blood work) drawn today and your tests are completely normal, you will receive your results only by: [place] (if you have MyChart) OR A paper copy in the mail If you have any lab test that is abnormal or we need to change your treatment, we will call you to review the results.   Testing/Procedures: Your physician has requested that you have cardiac CT. Cardiac computed tomography (CT) is a painless test that uses an x-ray machine to take clear, detailed pictures of your heart.  Your physician has recommended that you have an ablation. Catheter ablation is a medical procedure used to treat some cardiac arrhythmias (irregular heartbeats). During catheter ablation, a long, thin, flexible tube is put into a blood vessel in your groin (upper thigh), or neck. This tube is called an ablation catheter. It is then guided to your heart through the blood vessel. Radio frequency waves destroy small areas of heart tissue where abnormal heartbeats may cause an arrhythmia to start. Please see the instruction sheet given to you today.   You will get a call with date/time for this test when it is approved by your insurance.   Follow-Up: At [facility], you and your health needs are our priority.  As part of our continuing mission to provide you with exceptional heart care, we have created designated Provider Care Teams.  These Care Teams include your primary Cardiologist (physician) and Advanced Practice Providers (APPs -  Physician Assistants and Nurse Practitioners) who all work together to provide you with the care you need, when you need it.   Your next  appointment:     Your next appointment will be at the [facility] street office after your current tests have been evaluated.     Nurse navigator:  Lenice Llamas [phone]       Left Atrial Appendage Closure Device Implantation    Left atrial appendage (LAA) closure device implantation is a procedure to put a small device in the LAA of the heart. The LAA is a small sac in the wall of the heart's left upper chamber. Blood clots can form in the LAA in people with atrial fibrillation (AFib). The device closes the LAA to help prevent a blood clot and stroke. Cardiac Ablation Cardiac ablation is a procedure to destroy (ablate) some heart tissue that is sending bad signals. These bad signals causeproblems in heart rhythm. The heart has many areas that make these signals. If there are problems in these areas, they can make the heart beat in a way that is not normal.Destroying some tissues can help make the heart rhythm normal. Tell your doctor about: Any allergies you have. All medicines you are taking. These include vitamins, herbs, eye drops, creams, and over-the-counter medicines. Any problems you or family members have had with medicines that make you fall asleep (anesthetics). Any blood disorders you have. Any surgeries you have had. Any medical conditions you have, such as kidney failure. Whether you are pregnant or may be pregnant. What are the risks? This is a safe procedure. But problems may occur, including: Infection. Bruising and bleeding. Bleeding into the chest. Stroke or blood clots. Damage to  nearby areas of your body. Allergies to medicines or dyes. The need for a pacemaker if the normal system is damaged. Failure of the procedure to treat the problem. What happens before the procedure? Medicines Ask your doctor about: Changing or stopping your normal medicines. This is important. Taking aspirin and ibuprofen. Do not take these medicines unless your doctor tells you to take  them. Taking other medicines, vitamins, herbs, and supplements. General instructions Follow instructions from your doctor about what you cannot eat or drink. Plan to have someone take you home from the hospital or clinic. If you will be going home right after the procedure, plan to have someone with you for 24 hours. Ask your doctor what steps will be taken to prevent infection. What happens during the procedure?  An IV tube will be put into one of your veins. You will be given a medicine to help you relax. The skin on your neck or groin will be numbed. A cut (incision) will be made in your neck or groin. A needle will be put through your cut and into a large vein. A tube (catheter) will be put into the needle. The tube will be moved to your heart. Dye may be put through the tube. This helps your doctor see your heart. Small devices (electrodes) on the tube will send out signals. A type of energy will be used to destroy some heart tissue. The tube will be taken out. Pressure will be held on your cut. This helps stop bleeding. A bandage will be put over your cut. The exact procedure may vary among doctors and hospitals. What happens after the procedure? You will be watched until you leave the hospital or clinic. This includes checking your heart rate, breathing rate, oxygen, and blood pressure. Your cut will be watched for bleeding. You will need to lie still for a few hours. Do not drive for 24 hours or as long as your doctor tells you. Summary Cardiac ablation is a procedure to destroy some heart tissue. This is done to treat heart rhythm problems. Tell your doctor about any medical conditions you may have. Tell him or her about all medicines you are taking to treat them. This is a safe procedure. But problems may occur. These include infection, bruising, bleeding, and damage to nearby areas of your body. Follow what your doctor tells you about food and drink. You may also be told to  change or stop some of your medicines. After the procedure, do not drive for 24 hours or as long as your doctor tells you. This information is not intended to replace advice given to you by your health care provider. Make sure you discuss any questions you have with your healthcare provider. Document Revised: 07/27/2019 Document Reviewed: 07/27/2019 Elsevier Patient Education  2022 [employer].

## 2021-05-06 NOTE — Pre-Procedure Instructions (Signed)
Surgical Instructions    Your procedure is scheduled on Tuesday, September 13th, 2022.  Report to [facility] Main Entrance "A" at 08:30 A.M., then check in with the Admitting office.  Call this number if you have problems the morning of surgery:  [phone]   If you have any questions prior to your surgery date call [phone]: Open Monday-Friday 8am-4pm    Remember:  Do not eat or drink after midnight the night before your surgery    Take these medicines the morning of surgery with A SIP OF WATER: allopurinol (ZYLOPRIM) amiodarone (PACERONE) bisoprolol (ZEBETA)  buPROPion (WELLBUTRIN SR)  citalopram (CELEXA) omeprazole (PRILOSEC) potassium chloride SA (KLOR-CON M20) venlafaxine XR (EFFEXOR-XR)   Take these medications the morning of surgery with A SIP OF WATER AS NEEDED: albuterol (VENTOLIN HFA) - please bring with you on day of surgery metoprolol tartrate (LOPRESSOR)   Follow your surgeon's instructions on when to stop Rivaroxaban (XARELTO).  If no instructions were given by your surgeon then you will need to call the office to get those instructions.     As of today, STOP taking any Aspirin (unless otherwise instructed by your surgeon) Aleve, Naproxen, Ibuprofen, Motrin, Advil, Goody's, BC's, all herbal medications, fish oil, and all vitamins.  WHAT DO I DO ABOUT MY DIABETES MEDICATION?  empagliflozin (JARDIANCE) Do NOT take Monday, 05/19/21 or Tuesday, 05/20/21  HOW TO MANAGE YOUR DIABETES BEFORE AND AFTER SURGERY  Why is it important to control my blood sugar before and after surgery? Improving blood sugar levels before and after surgery helps healing and can limit problems. A way of improving blood sugar control is eating a healthy diet by:  Eating less sugar and carbohydrates  Increasing activity/exercise  Talking with your doctor about reaching your blood sugar goals High blood sugars (greater than 180 mg/dL) can raise your risk of infections and slow your  recovery, so you will need to focus on controlling your diabetes during the weeks before surgery. Make sure that the doctor who takes care of your diabetes knows about your planned surgery including the date and location.  How do I manage my blood sugar before surgery? Check your blood sugar at least 4 times a day, starting 2 days before surgery, to make sure that the level is not too high or low.  Check your blood sugar the morning of your surgery when you wake up and every 2 hours until you get to the Short Stay unit.  If your blood sugar is less than 70 mg/dL, you will need to treat for low blood sugar: Treat a low blood sugar (less than 70 mg/dL) with  cup of clear juice (cranberry or apple), 4 glucose tablets, OR glucose gel. Recheck blood sugar in 15 minutes after treatment (to make sure it is greater than 70 mg/dL). If your blood sugar is not greater than 70 mg/dL on recheck, call [phone] for further instructions. Report your blood sugar to the short stay nurse when you get to Short Stay.  If you are admitted to the hospital after surgery: Your blood sugar will be checked by the staff and you will probably be given insulin after surgery (instead of oral diabetes medicines) to make sure you have good blood sugar levels. The goal for blood sugar control after surgery is 80-180 mg/dL.           Do not wear jewelry or makeup Do not wear lotions, powders, perfumes, or deodorant. Do not shave 48 hours prior to surgery.  Do not bring valuables to the hospital. DO Not wear nail polish, gel polish, artificial nails, or any other type of covering on  natural nails including finger and toenails. If patients have artificial nails, gel coating, etc. that need to be removed by a nail salon please have this removed prior to surgery or surgery may need to be canceled/delayed if the surgeon/ anesthesia feels like the patient is unable to be adequately monitored.             [place] is not  responsible for any belongings or valuables.  Do NOT Smoke (Tobacco/Vaping) or drink Alcohol 24 hours prior to your procedure If you use a CPAP at night, you may bring all equipment for your overnight stay.   Contacts, glasses, dentures or bridgework may not be worn into surgery, please bring cases for these belongings   For patients admitted to the hospital, discharge time will be determined by your treatment team.   Patients discharged the day of surgery will not be allowed to drive home, and someone needs to stay with them for 24 hours.  ONLY 1 SUPPORT PERSON MAY BE PRESENT WHILE YOU ARE IN SURGERY. IF YOU ARE TO BE ADMITTED ONCE YOU ARE IN YOUR ROOM YOU WILL BE ALLOWED TWO (2) VISITORS.  Minor children may have two parents present. Special consideration for safety and communication needs will be reviewed on a case by case basis.  Special instructions:    Oral Hygiene is also important to reduce your risk of infection.  Remember - BRUSH YOUR TEETH THE MORNING OF SURGERY WITH YOUR REGULAR TOOTHPASTE   [place]- Preparing For Surgery  Before surgery, you can play an important role. Because skin is not sterile, your skin needs to be as free of germs as possible. You can reduce the number of germs on your skin by washing with CHG (chlorahexidine gluconate) Soap before surgery.  CHG is an antiseptic cleaner which kills germs and bonds with the skin to continue killing germs even after washing.     Please do not use if you have an allergy to CHG or antibacterial soaps. If your skin becomes reddened/irritated stop using the CHG.  Do not shave (including legs and underarms) for at least 48 hours prior to first CHG shower. It is OK to shave your face.  Please follow these instructions carefully.     Shower the NIGHT BEFORE SURGERY and the MORNING OF SURGERY with CHG Soap.   If you chose to wash your hair, wash your hair first as usual with your normal shampoo. After you shampoo, rinse your  hair and body thoroughly to remove the shampoo.  Then [employer] and genitals (private parts) with your normal soap and rinse thoroughly to remove soap.  After that Use CHG Soap as you would any other liquid soap. You can apply CHG directly to the skin and wash gently with a scrungie or a clean washcloth.   Apply the CHG Soap to your body ONLY FROM THE NECK DOWN.  Do not use on open wounds or open sores. Avoid contact with your eyes, ears, mouth and genitals (private parts). Wash Face and genitals (private parts)  with your normal soap.   Wash thoroughly, paying special attention to the area where your surgery will be performed.  Thoroughly rinse your body with warm water from the neck down.  DO NOT shower/wash with your normal soap after using and rinsing off the CHG Soap.  Pat yourself dry with  a CLEAN TOWEL.  Wear CLEAN PAJAMAS to bed the night before surgery  Place CLEAN SHEETS on your bed the night before your surgery  DO NOT SLEEP WITH PETS.   Day of Surgery:  Take a shower with CHG soap. Wear Clean/Comfortable clothing the morning of surgery Do not apply any deodorants/lotions.   Remember to brush your teeth WITH YOUR REGULAR TOOTHPASTE.   Please read over the following fact sheets that you were given.

## 2021-05-06 NOTE — Telephone Encounter (Signed)
Pt c/o medication issue:  1. Name of Medication:  metoprolol tartrate (LOPRESSOR) 25 MG tablet  2. How are you currently taking this medication (dosage and times per day)?   3. Are you having a reaction (difficulty breathing--STAT)?   4. What is your medication issue?   [facility] is wanting to clarify that the patient has only been prescribed 1 tablet prior to distributing. Please return call to discuss at [phone].

## 2021-05-06 NOTE — Telephone Encounter (Signed)
[facility] to [facility]  Confirmed prescription

## 2021-05-06 NOTE — Progress Notes (Signed)
Electrophysiology Office Note:    Date:  05/06/2021   ID:  [name] 08/05/1956, MRN [id number]  PCP:  Wayna Chalet, NP  [facility] Cardiologist:  None  [facility] Electrophysiologist:  Virl Axe, MD   Referring MD: Wayna Chalet, NP   Chief Complaint: Atrial fibrillation and nonischemic cardiomyopathy  History of Present Illness:    [name] is a 65 y.o. female who presents for an evaluation of atrial fibrillation at the request of Dr. Caryl Comes. Their medical history includes CRT-D for nonischemic cardiomyopathy (previous lead fracture requiring extraction and revision) inappropriate shocks for rapidly conducted atrial fibrillation, chronic systolic heart failure, breast cancer treated with Adriamycin x4 cycles.  The patient last saw Dr. Caryl Comes on April 18, 2021.  At that appointment she was started on amiodarone.  Given frequent severe bruising and history of falls, the patient is referred to discuss ablation in addition to [facility] implant.  Past Medical History:  Diagnosis Date   AICD (automatic cardioverter/defibrillator) present    Biventricular implantable cardiac defibrillator)    Medtronic   Breast cancer [facility])    s/p mastectomy and chemo with adriamycin (1998)   CHF (congestive heart failure) (HCC)    takes Lasix and Aldactone daily   GERD (gastroesophageal reflux disease)    takes Omeprazole daily   Gout    takes Allopurinol daily   History of bronchitis 2 yrs ago   History of shingles    Hyperlipidemia    takes Simvastatin daily   Insomnia    takes Ambien nightly   Nonischemic cardiomyopathy ([place])    EF 55% echo 2012   NSVT (nonsustained ventricular tachycardia) (HCC)    Obesity    OSA (obstructive sleep apnea)    moderate OSA with an AHi of 18.6/hr with no significant central sleep apnea and O2 sats as low as 81%.  Now on auto CPAP.     Pneumonia 57yrs ago   Presence of permanent cardiac pacemaker    Shortness of  breath dyspnea    with exertion   sprint Fidelis     Past Surgical History:  Procedure Laterality Date   ABDOMINAL HYSTERECTOMY     APPENDECTOMY  1978   BiV ICD  2007   Medtronic   BIV ICD GENERTAOR CHANGE OUT N/A 12/16/2011   Procedure: BIV ICD GENERTAOR CHANGE OUT;  Surgeon: [name], MD;  Location: [facility] CATH LAB;  Service: Cardiovascular;  Laterality: N/A;   COLONOSCOPY     cyst removed from wrist Left    early 70's   ICD LEAD REMOVAL Right 10/18/2014   Procedure: ICD LEAD REMOVAL/EXTRACTION/REIMPLANTATION;  Surgeon: Evans Lance, MD;  Location: [place];  Service: Cardiovascular;  Laterality: Right;   MASTECTOMY Left 1998   port a cath placed  1998   port removed  [place] CATH N/A 06/20/2018   Procedure: RIGHT HEART CATH;  Surgeon: Jolaine Artist, MD;  Location: [place] CV LAB;  Service: Cardiovascular;  Laterality: N/A;    Current Medications: Current Meds  Medication Sig   albuterol (VENTOLIN HFA) 108 (90 Base) MCG/ACT inhaler Inhale 2 puffs into the lungs every 6 (six) hours as needed for wheezing or shortness of breath.   alendronate (FOSAMAX) 70 MG tablet Take 70 mg by mouth every Monday.   allopurinol (ZYLOPRIM) 100 MG tablet Take 100 mg by mouth daily.    amiodarone (PACERONE) 200 MG tablet Take 2 tablets (400 mg total) by mouth  2 (two) times daily for 14 days, THEN 1 tablet (200 mg total) 2 (two) times daily for 14 days, THEN 1 tablet (200 mg total) daily.   atorvastatin (LIPITOR) 10 MG tablet Take 10 mg by mouth at bedtime.   bisoprolol (ZEBETA) 5 MG tablet Take 1.5 tablets (7.5 mg total) by mouth daily.   buPROPion (WELLBUTRIN SR) 150 MG 12 hr tablet Take 150 mg by mouth 2 (two) times daily.   Cholecalciferol (VITAMIN D3) 5000 units TABS Take 5,000 Units by mouth daily.   citalopram (CELEXA) 20 MG tablet Take 20 mg by mouth daily.    empagliflozin (JARDIANCE) 10 MG TABS tablet Take 1 tablet (10 mg total) by mouth daily before breakfast.    losartan (COZAAR) 25 MG tablet Take 1 tablet (25 mg total) by mouth daily.   metolazone (ZAROXOLYN) 2.5 MG tablet TAKE 1 TABLET BY MOUTH ONCE A WEEK ON THURSDAYS   Multiple Vitamins-Minerals (ONE DAILY FOR WOMEN 50+ ADV PO) Take 1 tablet by mouth daily.   omeprazole (PRILOSEC) 20 MG capsule Take 20 mg by mouth daily.   potassium chloride SA (KLOR-CON M20) 20 MEQ tablet Take 3 tablets (60 mEq total) by mouth daily. (Patient taking differently: Take 60 mEq by mouth 2 (two) times daily.)   Rivaroxaban (XARELTO) 15 MG TABS tablet Take 1 tablet (15 mg total) by mouth daily with supper.   spironolactone (ALDACTONE) 25 MG tablet TAKE 1 TABLET EVERY DAY   torsemide (DEMADEX) 20 MG tablet Take 40 mg by mouth 2 (two) times daily.   venlafaxine XR (EFFEXOR-XR) 150 MG 24 hr capsule Take 150 mg by mouth daily with breakfast.   vitamin B-12 (CYANOCOBALAMIN) 100 MCG tablet Take 100 mcg by mouth daily.   zolpidem (AMBIEN) 10 MG tablet Take 10 mg by mouth at bedtime.     Allergies:   Buprenorphine hcl, Morphine and related, Percocet [oxycodone-acetaminophen], and Oxycodone   Social History   Socioeconomic History   Marital status: Married    Spouse name: Not on file   Number of children: Not on file   Years of education: Not on file   Highest education level: Not on file  Occupational History   Not on file  Tobacco Use   Smoking status: Never   Smokeless tobacco: Never  Vaping Use   Vaping Use: Never used  Substance and Sexual Activity   Alcohol use: No   Drug use: No   Sexual activity: Yes    Birth control/protection: None  Other Topics Concern   Not on file  Social History Narrative   Not on file   Social Determinants of Health   Financial Resource Strain: Not on file  Food Insecurity: Not on file  Transportation Needs: Not on file  Physical Activity: Not on file  Stress: Not on file  Social Connections: Not on file     Family History: The patient's family history includes  Alzheimer's disease in her father; Diabetes in her mother; Heart disease in her father.  ROS:   Please see the history of present illness.    All other systems reviewed and are negative.  EKGs/Labs/Other Studies Reviewed:    The following studies were reviewed today: Prior notes  December 12, 2020 echo personally reviewed Left ventricular function moderately decreased, 40% Right ventricular function normal Dilated left and right atria Moderate TR  April 03, 2021 device interrogation personally reviewed BiV pacing 70%  EKG:  The ekg ordered today demonstrates biventricular paced rhythm, sinus rhythm.  Recent Labs: 02/10/2021: Magnesium 2.3 02/24/2021: B Natriuretic Peptide 326.4 04/03/2021: ALT 15; Hemoglobin 12.4; Platelets 266; TSH 5.600 04/18/2021: BUN 39; Creatinine, Ser 1.83; Potassium 3.3; Sodium 138  Recent Lipid Panel    Component Value Date/Time   CHOL 133 08/01/2014 1057   TRIG 94 08/01/2014 1057   HDL 43 08/01/2014 1057   CHOLHDL 3.1 08/01/2014 1057   VLDL 19 08/01/2014 1057   LDLCALC 71 08/01/2014 1057   LDLDIRECT 147.2 01/15/2009 0941    Physical Exam:    VS:  BP 96/74   Pulse 85   Ht 5\' 1"  (1.549 m)   Wt 200 lb (90.7 kg)   BMI 37.79 kg/m     Wt Readings from Last 3 Encounters:  05/06/21 200 lb (90.7 kg)  04/18/21 198 lb (89.8 kg)  04/14/21 196 lb (88.9 kg)     GEN:  Well nourished, well developed in no acute distress.  Obese HEENT: Normal NECK: No JVD; No carotid bruits LYMPHATICS: No lymphadenopathy CARDIAC: RRR, no murmurs, rubs, gallops RESPIRATORY:  Clear to auscultation without rales, wheezing or rhonchi  ABDOMEN: Soft, non-tender, non-distended MUSCULOSKELETAL:  No edema; No deformity  SKIN: Warm and dry NEUROLOGIC:  Alert and oriented x 3 PSYCHIATRIC:  Normal affect   ASSESSMENT:    1. Persistent atrial fibrillation ([place])   2. Chronic systolic heart failure (HCC)   3. Obesity (BMI 30-39.9)   4. Nonischemic cardiomyopathy ([place])   5. OSA  (obstructive sleep apnea)    PLAN:    In order of problems listed above:  1. Persistent atrial fibrillation (HCC) Symptomatic.  Contributing to a poor biventricular pacing percentage.  A rhythm control strategy is indicated.  I discussed options for rhythm control including antiarrhythmic drug therapy and ablation.  She is currently prescribed amiodarone but has not started taking medication yet given concerns for off target effects.  We discussed the risks and efficacy between the 2 strategies for rhythm control.  She would like to pursue ablation.  I discussed the risks, efficacy, recovery time after ablation.  I discussed the potential need for future ablations.  Ablation strategy would be PVI plus posterior wall ablation.  Risk, benefits, and alternatives to EP study and radiofrequency ablation for afib were also discussed in detail today. These risks include but are not limited to stroke, bleeding, vascular damage, tamponade, perforation, damage to the esophagus, lungs, and other structures, pulmonary vein stenosis, worsening renal function, and death. The patient understands these risk and wishes to proceed.  We will therefore proceed with catheter ablation at the next available time.  Carto, ICE, anesthesia are requested for the procedure.  Will also obtain CT PV protocol prior to the procedure to exclude LAA thrombus and further evaluate atrial anatomy.  During today's appointment, we also discussed stroke prevention strategies including anticoagulation and left atrial appendage occlusion.  Given her history of multiple falls and severe bruising, she would like to pursue a strategy that avoids long-term exposure to anticoagulation.  I think this is a reasonable plan.  We would perform these in a staged fashion separated by about 6 to 8 weeks.  The watchman procedure was discussed in detail with the patient including the risks and recovery and she wishes to proceed.  We will use the same CT scan  that we will use before the ablation procedure to plan for the watchman implant.  ------  I have seen Maryann Alar in the office today who is being considered for a Watchman left atrial  appendage closure device. I believe they will benefit from this procedure given their history of atrial fibrillation, CHA2DS2-VASc score of at least 3 and unadjusted ischemic stroke rate of 4.6% per year. Unfortunately, the patient is not felt to be a long term anticoagulation candidate secondary to a history of falls and severe bruising. The patient's chart has been reviewed and I feel that they would be a candidate for short term oral anticoagulation after Watchman implant.   It is my belief that after undergoing a LAA closure procedure, Maryann Alar will not need long term anticoagulation which eliminates anticoagulation side effects and major bleeding risk.   Procedural risks for the Watchman implant have been reviewed with the patient including a 0.5% risk of stroke, <1% risk of perforation and <1% risk of device embolization.    The published clinical data on the safety and effectiveness of WATCHMAN include but are not limited to the following: - Holmes DR, Mechele Claude, Sick P et al. for the PROTECT AF Investigators. Percutaneous closure of the left atrial appendage versus warfarin therapy for prevention of stroke in patients with atrial fibrillation: a randomised non-inferiority trial. Lancet 2009; 374: 534-42. Mechele Claude, Doshi SK, Abelardo Diesel D et al. on behalf of the PROTECT AF Investigators. Percutaneous Left Atrial Appendage Closure for Stroke Prophylaxis in Patients With Atrial Fibrillation 2.3-Year Follow-up of the PROTECT AF (Watchman Left Atrial Appendage System for Embolic Protection in Patients With Atrial Fibrillation) Trial. Circulation 2013; 127:720-729. - Alli O, Doshi S,  Kar S, Reddy VY, Sievert H et al. Quality of Life Assessment in the Randomized PROTECT AF (Percutaneous  Closure of the Left Atrial Appendage Versus Warfarin Therapy for Prevention of Stroke in Patients With Atrial Fibrillation) Trial of Patients at Risk for Stroke With Nonvalvular Atrial Fibrillation. J Am Coll Cardiol 2013; 96:7591-6. Vertell Limber DR, Tarri Abernethy, Price M, [place], Sievert H, Doshi S, Huber K, Reddy V. Prospective randomized evaluation of the Watchman left atrial appendage Device in patients with atrial fibrillation versus long-term warfarin therapy; the PREVAIL trial. Journal of the [employer] of Cardiology, Vol. 4, No. 1, 2014, 1-11. - Kar S, Doshi SK, Sadhu A, Horton R, Osorio J et al. Primary outcome evaluation of a next-generation left atrial appendage closure device: results from the PINNACLE FLX trial. Circulation 2021;143(18)1754-1762.   After today's visit with the patient which was dedicated solely for shared decision making visit regarding LAA closure device, the patient decided to proceed with the LAA appendage closure procedure scheduled to be done in the [facility] future at [facility].  She will continue to take her Xarelto for stroke prophylaxis.  She will continue this uninterrupted for now until at least 45 days after the watchman implant.   2. Chronic systolic heart failure (HCC) NYHA class II-III.  Warm and dry on exam today.  Last ejection fraction 40% in April 2022.  Continue bisoprolol, Jardiance, losartan, metolazone, spironolactone, torsemide.  3. Obesity (BMI 30-39.9) Weight loss encouraged.  4. Nonischemic cardiomyopathy ([place]) See #2.        Total time spent with patient today 50 minutes. This includes reviewing records, evaluating the patient and coordinating care.  Medication Adjustments/Labs and Tests Ordered: Current medicines are reviewed at length with the patient today.  Concerns regarding medicines are outlined above.  Orders Placed This Encounter  Procedures   CT CARDIAC MORPH/PULM VEIN W/CM&W/O CA SCORE   Basic Metabolic Panel  (BMET)   CBC w/Diff   Basic Metabolic Panel (  BMET)   EKG 12-Lead   No orders of the defined types were placed in this encounter.    Signed, Hilton Cork. Quentin Ore, MD, [facility], [facility] 05/06/2021 1:07 PM    Electrophysiology [facility]

## 2021-05-07 ENCOUNTER — Encounter (HOSPITAL_COMMUNITY): Payer: Self-pay | Admitting: Physician Assistant

## 2021-05-07 ENCOUNTER — Encounter (HOSPITAL_COMMUNITY): Payer: Self-pay

## 2021-05-07 ENCOUNTER — Encounter: Payer: Self-pay | Admitting: Cardiology

## 2021-05-07 ENCOUNTER — Other Ambulatory Visit: Payer: Self-pay

## 2021-05-07 ENCOUNTER — Encounter (HOSPITAL_COMMUNITY)
Admission: RE | Admit: 2021-05-07 | Discharge: 2021-05-07 | Disposition: A | Discharge status: Home / Self Care | Payer: Medicare PPO | Source: Ambulatory Visit | Attending: Neurological Surgery | Admitting: Neurological Surgery

## 2021-05-07 ENCOUNTER — Encounter (HOSPITAL_COMMUNITY): Payer: Self-pay | Admitting: Vascular Surgery

## 2021-05-07 DIAGNOSIS — Z01812 Encounter for preprocedural laboratory examination: Secondary | ICD-10-CM | POA: Insufficient documentation

## 2021-05-07 HISTORY — DX: Chronic kidney disease, unspecified: N18.9

## 2021-05-07 LAB — CBC
HCT: 34 % — ABNORMAL LOW (ref 36.0–46.0)
Hemoglobin: 10.5 g/dL — ABNORMAL LOW (ref 12.0–15.0)
MCH: 33.2 pg (ref 26.0–34.0)
MCHC: 30.9 g/dL (ref 30.0–36.0)
MCV: 107.6 fL — ABNORMAL HIGH (ref 80.0–100.0)
Platelets: 273 10*3/uL (ref 150–400)
RBC: 3.16 MIL/uL — ABNORMAL LOW (ref 3.87–5.11)
RDW: 15.6 % — ABNORMAL HIGH (ref 11.5–15.5)
WBC: 9.6 10*3/uL (ref 4.0–10.5)
nRBC: 0 % (ref 0.0–0.2)

## 2021-05-07 LAB — SURGICAL PCR SCREEN
MRSA, PCR: NEGATIVE
Staphylococcus aureus: NEGATIVE

## 2021-05-07 LAB — HEMOGLOBIN A1C
Hgb A1c MFr Bld: 5.8 % — ABNORMAL HIGH (ref 4.8–5.6)
Mean Plasma Glucose: 119.76 mg/dL

## 2021-05-07 NOTE — Progress Notes (Signed)
PCP - Tory Emerald, NP Cardiologist - cameron lambert and dr bensimmon EP physician: Adam Phenix  PPM/ICD - yes Device Orders - staff messaged Rep Notified - yes emailed  Chest x-ray - n/a EKG - 04/14/21 Stress Test - 03/17/06 ECHO - 12/12/20 Cardiac Cath -06/20/18   Sleep Study - denies CPAP - no  No diabetes. Takes jardiance for CHF  Please stop taking Rivaroxaban (XARELTO) 3 days prior to surgery.  Your last dose will be 05/16/21. Your cardiologist may call and instruct you differently.    As of today, STOP taking any Aspirin (unless otherwise instructed by your surgeon) Aleve, Naproxen, Ibuprofen, Motrin, Advil, Goody's, BC's, all herbal medications, fish oil, and all vitamins.  ERAS Protcol -no   COVID TEST- covid test scheduled for 05/16/21.   Anesthesia review: yes, pacemaker  Patient denies shortness of breath, fever, cough and chest pain at PAT appointment   All instructions explained to the patient, with a verbal understanding of the material. Patient agrees to go over the instructions while at home for a better understanding. Patient also instructed to self quarantine after being tested for COVID-19. The opportunity to ask questions was provided.

## 2021-05-07 NOTE — Telephone Encounter (Signed)
Dr. Quentin Ore We have received preop clearance for kyphoplasty requiring xarelto hold. You saw her yesterday to discuss ablation and watchman. Can you please comment on the timing of surgery and [place] hold?  Thank you  Angie

## 2021-05-07 NOTE — Progress Notes (Signed)
Contacted Shelby Dubin. PA-C about the need for another BMET since last one was 05/06/21. She stated the patient did not need another one today during PAT appt.

## 2021-05-07 NOTE — Progress Notes (Signed)
PERIOPERATIVE PRESCRIPTION FOR IMPLANTED CARDIAC DEVICE PROGRAMMING  Patient Information: Name:  CINDERELLA CHRISTOFFERSEN  DOB:  1956-07-04  MRN:  [id number]    Planned Procedure:  kyphoplasty lumbar one  Surgeon:  Elwin Sleight  Date of Procedure:  05/20/21  Cautery will be used.  Position during surgery:  prone   Please send documentation back to:  [facility] (Fax # [phone])   Device Information:  Clinic EP Physician:  Virl Axe, MD  Device Type:  Defibrillator (CRT-D)  DTBA1D1 [place]  Manufacturer and Phone #:  Medtronic: [phone] Pacemaker Dependent?:  No. Date of Last Device Check:  04/18/21 in clinic Normal Device Function?:  Yes.    Electrophysiologist's Recommendations:  Have magnet available. Provide continuous ECG monitoring when magnet is used or reprogramming is to be performed.  Procedure may interfere with device function.  Magnet should be placed over device during procedure. Discussed with Burnetta Sabin, Medtronic Rep  Per Device Clinic Standing Orders, Vergie Living [place], [place]  3:42 PM 05/07/2021

## 2021-05-07 NOTE — Pre-Procedure Instructions (Addendum)
Surgical Instructions    Your procedure is scheduled on Tuesday, September 13th, 2022.  Report to [facility] Main Entrance "A" at 08:30 A.M., then check in with the Admitting office.  Call this number if you have problems the morning of surgery:  [phone]   If you have any questions prior to your surgery date call [phone]: Open Monday-Friday 8am-4pm    Remember:  Do not eat or drink after midnight the night before your surgery    Take these medicines the morning of surgery with A SIP OF WATER: allopurinol (ZYLOPRIM) amiodarone (PACERONE) bisoprolol (ZEBETA)  buPROPion (WELLBUTRIN SR)  citalopram (CELEXA) omeprazole (PRILOSEC) potassium chloride SA (KLOR-CON M20) venlafaxine XR (EFFEXOR-XR)   Take these medications the morning of surgery with A SIP OF WATER AS NEEDED: albuterol (VENTOLIN HFA) - please bring with you on day of surgery metoprolol tartrate (LOPRESSOR)   Please stop taking Rivaroxaban (XARELTO) 3 days prior to surgery.  Your last dose will be 05/16/21. Your cardiologist may call and instruct you differently.   As of today, STOP taking any Aspirin (unless otherwise instructed by your surgeon) Aleve, Naproxen, Ibuprofen, Motrin, Advil, Goody's, BC's, all herbal medications, fish oil, and all vitamins.  WHAT DO I DO ABOUT MY DIABETES MEDICATION?  empagliflozin (JARDIANCE) Do NOT take Monday, 05/19/21 or Tuesday, 05/20/21  HOW TO MANAGE YOUR DIABETES BEFORE AND AFTER SURGERY  Why is it important to control my blood sugar before and after surgery? Improving blood sugar levels before and after surgery helps healing and can limit problems. A way of improving blood sugar control is eating a healthy diet by:  Eating less sugar and carbohydrates  Increasing activity/exercise  Talking with your doctor about reaching your blood sugar goals High blood sugars (greater than 180 mg/dL) can raise your risk of infections and slow your recovery, so you will need to focus  on controlling your diabetes during the weeks before surgery. Make sure that the doctor who takes care of your diabetes knows about your planned surgery including the date and location.  How do I manage my blood sugar before surgery? Check your blood sugar at least 4 times a day, starting 2 days before surgery, to make sure that the level is not too high or low.  Check your blood sugar the morning of your surgery when you wake up and every 2 hours until you get to the Short Stay unit.  If your blood sugar is less than 70 mg/dL, you will need to treat for low blood sugar: Treat a low blood sugar (less than 70 mg/dL) with  cup of clear juice (cranberry or apple), 4 glucose tablets, OR glucose gel. Recheck blood sugar in 15 minutes after treatment (to make sure it is greater than 70 mg/dL). If your blood sugar is not greater than 70 mg/dL on recheck, call [phone] for further instructions. Report your blood sugar to the short stay nurse when you get to Short Stay.  If you are admitted to the hospital after surgery: Your blood sugar will be checked by the staff and you will probably be given insulin after surgery (instead of oral diabetes medicines) to make sure you have good blood sugar levels. The goal for blood sugar control after surgery is 80-180 mg/dL.           Do not wear jewelry or makeup Do not wear lotions, powders, perfumes, or deodorant. Do not shave 48 hours prior to surgery.  Do not bring valuables to the hospital.  DO Not wear nail polish, gel polish, artificial nails, or any other type of covering on natural nails  including finger and toenails. If patients have artificial nails, gel coating, etc. that need to be removed by a nail salon please have this removed prior to surgery or surgery may need to be canceled/delayed if the surgeon/ anesthesia feels like the patient is unable to be adequately monitored.             [facility] is not responsible for any belongings or  valuables.  Do NOT Smoke (Tobacco/Vaping) or drink Alcohol 24 hours prior to your procedure If you use a CPAP at night, you may bring all equipment for your overnight stay.   Contacts, glasses, dentures or bridgework may not be worn into surgery, please bring cases for these belongings   For patients admitted to the hospital, discharge time will be determined by your treatment team.   Patients discharged the day of surgery will not be allowed to drive home, and someone needs to stay with them for 24 hours.  ONLY 1 SUPPORT PERSON MAY BE PRESENT WHILE YOU ARE IN SURGERY. IF YOU ARE TO BE ADMITTED ONCE YOU ARE IN YOUR ROOM YOU WILL BE ALLOWED TWO (2) VISITORS.  Minor children may have two parents present. Special consideration for safety and communication needs will be reviewed on a case by case basis.  Special instructions:    Oral Hygiene is also important to reduce your risk of infection.  Remember - BRUSH YOUR TEETH THE MORNING OF SURGERY WITH YOUR REGULAR TOOTHPASTE   [facility]- Preparing For Surgery  Before surgery, you can play an important role. Because skin is not sterile, your skin needs to be as free of germs as possible. You can reduce the number of germs on your skin by washing with CHG (chlorahexidine gluconate) Soap before surgery.  CHG is an antiseptic cleaner which kills germs and bonds with the skin to continue killing germs even after washing.     Please do not use if you have an allergy to CHG or antibacterial soaps. If your skin becomes reddened/irritated stop using the CHG.  Do not shave (including legs and underarms) for at least 48 hours prior to first CHG shower. It is OK to shave your face.  Please follow these instructions carefully.     Shower the NIGHT BEFORE SURGERY and the MORNING OF SURGERY with CHG Soap.   If you chose to wash your hair, wash your hair first as usual with your normal shampoo. After you shampoo, rinse your hair and body thoroughly to  remove the shampoo.  Then [employer] and genitals (private parts) with your normal soap and rinse thoroughly to remove soap.  After that Use CHG Soap as you would any other liquid soap. You can apply CHG directly to the skin and wash gently with a scrungie or a clean washcloth.   Apply the CHG Soap to your body ONLY FROM THE NECK DOWN.  Do not use on open wounds or open sores. Avoid contact with your eyes, ears, mouth and genitals (private parts). Wash Face and genitals (private parts)  with your normal soap.   Wash thoroughly, paying special attention to the area where your surgery will be performed.  Thoroughly rinse your body with warm water from the neck down.  DO NOT shower/wash with your normal soap after using and rinsing off the CHG Soap.  Pat yourself dry with a CLEAN TOWEL.  [place]  to bed the night before surgery  Place CLEAN SHEETS on your bed the night before your surgery  DO NOT SLEEP WITH PETS.   Day of Surgery: Take a shower with CHG soap. Wear Clean/Comfortable clothing the morning of surgery Do not apply any deodorants/lotions.   Remember to brush your teeth WITH YOUR REGULAR TOOTHPASTE.   Please read over the following fact sheets that you were given.

## 2021-05-08 NOTE — Progress Notes (Signed)
Anesthesia Chart Review:  Follows with EP cardiology for hx of  CRT-D for nonischemic cardiomyopathy (previous lead fracture requiring extraction and revision) inappropriate shocks for rapidly conducted atrial fibrillation, chronic systolic heart failure (EF 40% by echo 4/22). Recently started on amiodarone by Dr. Caryl Comes on 04/18/21.  Preop clearance per telephone encounter 05/14/2021, "Chart reviewed as part of pre-operative protocol coverage. Patient was contacted 05/14/2021 in reference to pre-operative risk assessment for pending surgery as outlined below.  [name] was last seen on 05/06/21 by Dr. Quentin Ore.  Since that day, [name] has done well. She can complete more than 4.0 METS without angina (stairs). Per our clinical pharmacist: Per office protocol, patient can hold Xarelto for 3 days prior to procedure, however will need to clarify ablation/Watchman plans first. If she has ablation, will not be able to interrupt anticoagulation for 3 weeks prior and 3 months after procedure. Ablation is scheduled for 06/27/21. OK to hold xarelto for procedure on 05/20/21 and resume as soon as safe after surgery. Therefore, based on ACC/AHA guidelines, the patient would be at acceptable risk for the planned procedure without further cardiovascular testing."  Of note, history of breast cancer treated with 4 cycles of adriamycin.  Preop labs reviewed, creatinine elevated 2.15 (recent baseline appears to be around 1.7), mild anemia with hemoglobin 10.5, otherwise unremarkable.  EKG 04/14/2021: Atrial sensed ventricular paced rhythm.  Rate 79.  Perioperative prescription for implanted cardiac device programming per note 05/07/2021: Device Information:   Clinic EP Physician:  Virl Axe, MD   Device Type:  Defibrillator (CRT-D)  DTBA1D1 [place]  Manufacturer and Phone #:  Medtronic: [phone] Pacemaker Dependent?:  No. Date of Last Device Check:  04/18/21 in clinic        Normal Device  Function?:  Yes.     Electrophysiologist's Recommendations:   Have magnet available. Provide continuous ECG monitoring when magnet is used or reprogramming is to be performed.  Procedure may interfere with device function.  Magnet should be placed over device during procedure. Discussed with [name], Medtronic Rep   TTE 12/12/2020:  1. Left ventricular ejection fraction, by estimation, is 40 to 45%. The  left ventricle has mildly decreased function. The left ventricle  demonstrates regional wall motion abnormalities. Abnormal (paradoxical)  septal motion, consistent with RV pacemaker.   Left ventricular diastolic parameters are indeterminate.   2. Right ventricular systolic function is normal. The right ventricular  size is moderately enlarged. There is normal pulmonary artery systolic  pressure.   3. Right atrial size was severely dilated.   4. The mitral valve is normal in structure. Trivial mitral valve  regurgitation.   5. The tricuspid valve is abnormal. Tricuspid valve regurgitation is  moderate. Septal leaflet prolapse with eccentric anterior directed jet,  may be underestimating degree of TR given eccentric jet.   6. The aortic valve is tricuspid. Aortic valve regurgitation is not  visualized. No aortic stenosis is present.   7. The inferior vena cava is dilated in size with >50% respiratory  variability, suggesting right atrial pressure of 8 mmHg.   [name] Phone [phone] 05/14/2021 12:33 PM

## 2021-05-09 ENCOUNTER — Telehealth: Payer: Self-pay

## 2021-05-09 NOTE — Telephone Encounter (Signed)
Outreach made to Pt.  Advised to stop losartan and spironolactone.  Pt is having repeat lab work on 05/16/2021 for a procedure.  Will follow

## 2021-05-13 ENCOUNTER — Other Ambulatory Visit: Payer: Self-pay | Admitting: Neurological Surgery

## 2021-05-13 NOTE — Progress Notes (Signed)
No ICM remote transmission received for 05/05/2021 and next ICM transmission scheduled for 05/26/2021.

## 2021-05-14 ENCOUNTER — Ambulatory Visit (HOSPITAL_COMMUNITY): Payer: Medicare PPO

## 2021-05-14 NOTE — Anesthesia Preprocedure Evaluation (Deleted)
Anesthesia Evaluation Anesthesia Physical Anesthesia Plan  ASA:   Anesthesia Plan:    Post-op Pain Management:    Induction:   PONV Risk Score and Plan:   Airway Management Planned:   Additional Equipment:   Intra-op Plan:   Post-operative Plan:   Informed Consent:   Plan Discussed with:   Anesthesia Plan Comments: (PAT note by Karoline Caldwell, PA-C: Follows with EP cardiology for hx of CRT-D for nonischemic cardiomyopathy (previous lead fracture requiring extraction and revision) inappropriate shocks for rapidly conducted atrial fibrillation, chronic systolic heart failure (EF 40% by echo 4/22). Recently started on amiodarone by Dr. Caryl Comes on 04/18/21.  Preop clearance per telephone encounter 05/14/2021, "Chart reviewed as part of pre-operative protocol coverage. Patient was contacted9/7/2022in reference to pre-operative risk assessment for pending surgery as outlined below. Nonie Lochner Hazelwoodwas last seen on 8/30/22by Dr. Quentin Ore. Since that day, KHALIS HITTLE done well.She can complete more than 4.0 METS without angina (stairs). Per our clinical pharmacist: Per office protocol, patient can holdXareltofor 3days prior to procedure, however will need to clarify ablation/Watchman plans first. If she has ablation, will not be able to interrupt anticoagulation for 3 weeks prior and 3 months after procedure. Ablation is scheduled for 06/27/21. OK to hold xarelto for procedure on 05/20/21 and resume as soon as safe after surgery.Therefore, based on ACC/AHA guidelines, the patient would be at acceptable risk for the planned procedure without further cardiovascular testing."  Of note, history of breast cancer treated with 4 cycles of adriamycin.  Preop labs reviewed, creatinine elevated 2.15 (recent baseline appears to be around 1.7), mild anemia with hemoglobin 10.5, otherwise unremarkable.  EKG 04/14/2021: Atrial sensed ventricular paced rhythm.  Rate 79.  Perioperative  prescription for implanted cardiac device programming per note 05/07/2021: Device Information:  Clinic EP Physician:Steven Caryl Comes, MD  Device Type:Defibrillator(CRT-D) [id number] [place]  Manufacturer and Phone #:Medtronic: [phone] Pacemaker Dependent?:No. Date of Last Device Check:04/18/21 in clinicNormal Device Function?:Yes.  Electrophysiologist's Recommendations:  . Have magnet available. . Provide continuous ECG monitoring when magnet is used or reprogramming is to be performed. . Procedure may interfere with device function. Magnet should be placed over device during procedure.Discussed with Burnetta Sabin, Medtronic Rep  TTE 12/12/2020: 1. Left ventricular ejection fraction, by estimation, is 40 to 45%. The  left ventricle has mildly decreased function. The left ventricle  demonstrates regional wall motion abnormalities. Abnormal (paradoxical)  septal motion, consistent with RV pacemaker.  Left ventricular diastolic parameters are indeterminate.  2. Right ventricular systolic function is normal. The right ventricular  size is moderately enlarged. There is normal pulmonary artery systolic  pressure.  3. Right atrial size was severely dilated.  4. The mitral valve is normal in structure. Trivial mitral valve  regurgitation.  5. The tricuspid valve is abnormal. Tricuspid valve regurgitation is  moderate. Septal leaflet prolapse with eccentric anterior directed jet,  may be underestimating degree of TR given eccentric jet.  6. The aortic valve is tricuspid. Aortic valve regurgitation is not  visualized. No aortic stenosis is present.  7. The inferior vena cava is dilated in size with >50% respiratory  variability, suggesting right atrial pressure of 8 mmHg. )        Anesthesia Quick Evaluation

## 2021-05-14 NOTE — Telephone Encounter (Signed)
Pt is returning call.  

## 2021-05-14 NOTE — Telephone Encounter (Signed)
Pt is scheduled for ablation on 06/27/21.  Per office protocol, patient can hold Xarelto for 3 days prior to procedure, however will need to clarify ablation/Watchman plans first. If she has ablation, will not be able to interrupt anticoagulation for 3 weeks prior and 3 months after procedure.  Pt also needs medical clearance. I left a VM 97/22.

## 2021-05-14 NOTE — Telephone Encounter (Signed)
   Name: [name]  DOB: 03/05/1956  MRN: [id number]   Primary Cardiologist: Dr. Quentin Ore / Dr. Caryl Comes  Chart reviewed as part of pre-operative protocol coverage. Patient was contacted 05/14/2021 in reference to pre-operative risk assessment for pending surgery as outlined below.  [name] was last seen on 05/06/21 by Dr. Quentin Ore.  Since that day, [name] has done well. She can complete more than 4.0 METS without angina (stairs).   Per our clinical pharmacist: Per office protocol, patient can hold Xarelto for 3 days prior to procedure, however will need to clarify ablation/Watchman plans first. If she has ablation, will not be able to interrupt anticoagulation for 3 weeks prior and 3 months after procedure.  Ablation is scheduled for 06/27/21. OK to hold xarelto for procedure on 05/20/21 and resume as soon as safe after surgery.   Therefore, based on ACC/AHA guidelines, the patient would be at acceptable risk for the planned procedure without further cardiovascular testing.   The patient was advised that if she develops new symptoms prior to surgery to contact our office to arrange for a follow-up visit, and she verbalized understanding.  I will route this recommendation to the requesting party via Epic fax function and remove from pre-op pool. Please call with questions.  [place], PA 05/14/2021, 11:38 AM

## 2021-05-15 ENCOUNTER — Other Ambulatory Visit: Payer: Self-pay

## 2021-05-15 ENCOUNTER — Ambulatory Visit (HOSPITAL_COMMUNITY): Payer: Medicare PPO | Attending: Cardiology

## 2021-05-15 DIAGNOSIS — I480 Paroxysmal atrial fibrillation: Secondary | ICD-10-CM | POA: Diagnosis not present

## 2021-05-15 LAB — ECHOCARDIOGRAM COMPLETE BUBBLE STUDY
Area-P 1/2: 3.48 cm2
MV M vel: 4.67 m/s
MV Peak grad: 87.2 mmHg
Radius: 0.5 cm
S' Lateral: 3.38 cm

## 2021-05-16 ENCOUNTER — Other Ambulatory Visit (HOSPITAL_COMMUNITY)
Admission: RE | Admit: 2021-05-16 | Discharge: 2021-05-16 | Disposition: A | Discharge status: Home / Self Care | Payer: Medicare PPO | Source: Ambulatory Visit | Attending: Neurological Surgery | Admitting: Neurological Surgery

## 2021-05-16 DIAGNOSIS — Z01812 Encounter for preprocedural laboratory examination: Secondary | ICD-10-CM | POA: Diagnosis present

## 2021-05-16 DIAGNOSIS — Z20822 Contact with and (suspected) exposure to covid-19: Secondary | ICD-10-CM | POA: Diagnosis not present

## 2021-05-16 LAB — SARS CORONAVIRUS 2 (TAT 6-24 HRS): SARS Coronavirus 2: NEGATIVE

## 2021-05-19 ENCOUNTER — Encounter: Payer: Medicare PPO | Admitting: Student

## 2021-05-20 ENCOUNTER — Ambulatory Visit (HOSPITAL_COMMUNITY): Admission: RE | Admit: 2021-05-20 | Payer: Medicare PPO | Source: Home / Self Care | Admitting: Neurological Surgery

## 2021-05-20 ENCOUNTER — Encounter (HOSPITAL_COMMUNITY): Admission: RE | Payer: Self-pay | Source: Home / Self Care

## 2021-05-20 SURGERY — KYPHOPLASTY
Anesthesia: General

## 2021-05-21 ENCOUNTER — Other Ambulatory Visit: Payer: Self-pay | Admitting: Neurological Surgery

## 2021-05-21 ENCOUNTER — Other Ambulatory Visit (HOSPITAL_COMMUNITY): Payer: Self-pay | Admitting: Neurological Surgery

## 2021-05-21 DIAGNOSIS — S32010A Wedge compression fracture of first lumbar vertebra, initial encounter for closed fracture: Secondary | ICD-10-CM

## 2021-05-23 ENCOUNTER — Other Ambulatory Visit: Payer: Self-pay

## 2021-05-23 ENCOUNTER — Encounter (HOSPITAL_COMMUNITY)
Admission: RE | Admit: 2021-05-23 | Discharge: 2021-05-23 | Disposition: A | Discharge status: Home / Self Care | Payer: Medicare PPO | Source: Ambulatory Visit | Attending: Neurological Surgery | Admitting: Neurological Surgery

## 2021-05-23 DIAGNOSIS — S32010A Wedge compression fracture of first lumbar vertebra, initial encounter for closed fracture: Secondary | ICD-10-CM

## 2021-05-23 MED ORDER — TECHNETIUM TC 99M MEDRONATE IV KIT
20.0000 | PACK | Freq: Once | INTRAVENOUS | Status: AC | PRN
  Administered 2021-05-23: 20 via INTRAVENOUS

## 2021-05-27 ENCOUNTER — Other Ambulatory Visit: Payer: Self-pay | Admitting: Student

## 2021-05-27 DIAGNOSIS — I480 Paroxysmal atrial fibrillation: Secondary | ICD-10-CM

## 2021-05-30 ENCOUNTER — Telehealth: Payer: Self-pay | Admitting: Internal Medicine

## 2021-05-30 NOTE — Telephone Encounter (Signed)
Pt last saw Dr Quentin Ore 05/06/21, last labs 05/06/21 Creat 2.15, age 65, weight 90.9kg, CrCl 36.44, based on CrCl pt is on appropriate dosage of Xarelto 15mg  QD.  Will refill rx.

## 2021-05-30 NOTE — Telephone Encounter (Signed)
 *  STAT* If patient is at the pharmacy, call can be transferred to refill team.   1. Which medications need to be refilled? (please list name of each medication and dose if known)   Rivaroxaban (XARELTO) 15 MG TABS tablet  2. Which pharmacy/location (including street and city if local pharmacy) is medication to be sent to?  [id number] [address], [place].  3. Do they need a 30 day or 90 day supply? 30 day

## 2021-05-30 NOTE — Telephone Encounter (Signed)
Refill request from pharmacy in [place] from 9/20, refilled in that encounter.  Pt last saw Dr Quentin Ore 05/06/21, last labs 05/06/21 Creat 2.15, age 65, weight 90.9kg, CrCl 36.44, based on CrCl pt is on appropriate dosage of Xarelto 15mg  QD.  Will refill rx sent to pharmacy. Marland Kitchen

## 2021-06-03 ENCOUNTER — Encounter (HOSPITAL_COMMUNITY): Payer: Self-pay

## 2021-06-03 NOTE — Progress Notes (Signed)
Unable to reach patient for monthly ICM remote follow up and not receiving monthly remote transmission.  Patient disenrolled due patient is not actively participating in ICM clinic.  Device clinic 91 day remote monitoring will continue per protocol.   ?

## 2021-06-05 ENCOUNTER — Other Ambulatory Visit: Payer: Self-pay

## 2021-06-05 ENCOUNTER — Encounter: Payer: Self-pay | Admitting: Internal Medicine

## 2021-06-05 ENCOUNTER — Encounter (HOSPITAL_COMMUNITY): Payer: Self-pay | Admitting: Neurological Surgery

## 2021-06-05 ENCOUNTER — Telehealth: Payer: Self-pay

## 2021-06-05 DIAGNOSIS — I4891 Unspecified atrial fibrillation: Secondary | ICD-10-CM

## 2021-06-05 DIAGNOSIS — I4819 Other persistent atrial fibrillation: Secondary | ICD-10-CM

## 2021-06-05 NOTE — Progress Notes (Addendum)
Mrs. Wynder denies chest pain or shortness of breath   at rest. Patient denies having any s/s of Covid in her household.  Patient denies any known exposure to Covid.   Mrs Mcconaughey has a pacemaker and defibrillator. I sent peri op device orders request to the Periop device clinic.  I instructed patient to shower with antibiotic soap, if it is available.  Dry off with a clean towel. Do not put lotion, powder, cologne or deodorant or makeup.No jewelry or piercings. Men may shave their face and neck. Woman should not shave. No nail polish, artificial or acrylic nails. Wear clean clothes, brush your teeth. Glasses, contact lens,dentures or partials may not be worn in the OR. If you need to wear them, please bring a case for glasses, do not wear contacts or bring a case, the hospital does not have contact cases, dentures or partials will have to be removed , make sure they are clean, we will provide a denture cup to put them in. You will need some one to drive you home and a responsible person over the age of 65 to stay with you for the first 24 hours after surgery.   Ms Klebba states that she stopped Xarelto was on 06/03/21

## 2021-06-05 NOTE — Progress Notes (Signed)
[facility] DEVICE PROGRAMMING  Patient Information: Name:  [name]  DOB:  [birth year]  MRN:  [id number]    Planned Procedure:  Kyphoplasty Lumbar one  Surgeon:  Dr. Pieter Partridge Dawley  Date of Procedure:  06/06/21  Cautery will be used.  Position during surgery: Prone   Please send documentation back to:  [facility] (Fax # [phone])  Device Information:  Clinic EP Physician:  Virl Axe, MD   Device Type:  Defibrillator Manufacturer and Phone #:  Medtronic: [phone] Pacemaker Dependent?:  No. Date of Last Device Check:  06/03/21 (Remote) Normal Device Function?:  Yes.    Electrophysiologist's Recommendations:  Have magnet available. Provide continuous ECG monitoring when magnet is used or reprogramming is to be performed.  Procedure may interfere with device function.  Magnet should be placed over device during procedure.  Per Device Clinic Standing Orders, Simone Curia, RN  1:37 PM 06/05/2021

## 2021-06-06 ENCOUNTER — Other Ambulatory Visit: Payer: Self-pay

## 2021-06-06 ENCOUNTER — Ambulatory Visit (HOSPITAL_COMMUNITY): Payer: Medicare PPO | Admitting: Physician Assistant

## 2021-06-06 ENCOUNTER — Ambulatory Visit (HOSPITAL_COMMUNITY): Payer: Medicare PPO

## 2021-06-06 ENCOUNTER — Encounter (HOSPITAL_COMMUNITY)
Admission: RE | Disposition: A | Discharge status: Home / Self Care | Payer: Self-pay | Source: Home / Self Care | Attending: Neurological Surgery

## 2021-06-06 ENCOUNTER — Ambulatory Visit (HOSPITAL_COMMUNITY)
Admission: RE | Admit: 2021-06-06 | Discharge: 2021-06-06 | Disposition: A | Discharge status: Home / Self Care | Payer: Medicare PPO | Attending: Neurological Surgery | Admitting: Neurological Surgery

## 2021-06-06 DIAGNOSIS — Z885 Allergy status to narcotic agent status: Secondary | ICD-10-CM | POA: Diagnosis not present

## 2021-06-06 DIAGNOSIS — Z419 Encounter for procedure for purposes other than remedying health state, unspecified: Secondary | ICD-10-CM

## 2021-06-06 DIAGNOSIS — Z7983 Long term (current) use of bisphosphonates: Secondary | ICD-10-CM | POA: Diagnosis not present

## 2021-06-06 DIAGNOSIS — I4891 Unspecified atrial fibrillation: Secondary | ICD-10-CM | POA: Diagnosis not present

## 2021-06-06 DIAGNOSIS — M8580 Other specified disorders of bone density and structure, unspecified site: Secondary | ICD-10-CM | POA: Insufficient documentation

## 2021-06-06 DIAGNOSIS — Z20822 Contact with and (suspected) exposure to covid-19: Secondary | ICD-10-CM | POA: Insufficient documentation

## 2021-06-06 DIAGNOSIS — Z79899 Other long term (current) drug therapy: Secondary | ICD-10-CM | POA: Insufficient documentation

## 2021-06-06 DIAGNOSIS — M8008XA Age-related osteoporosis with current pathological fracture, vertebra(e), initial encounter for fracture: Secondary | ICD-10-CM | POA: Insufficient documentation

## 2021-06-06 DIAGNOSIS — Z95810 Presence of automatic (implantable) cardiac defibrillator: Secondary | ICD-10-CM | POA: Diagnosis not present

## 2021-06-06 DIAGNOSIS — I480 Paroxysmal atrial fibrillation: Secondary | ICD-10-CM

## 2021-06-06 HISTORY — PX: KYPHOPLASTY: SHX5884

## 2021-06-06 HISTORY — DX: Other complications of anesthesia, initial encounter: T88.59XA

## 2021-06-06 LAB — SARS CORONAVIRUS 2 BY RT PCR (HOSPITAL ORDER, PERFORMED IN ~~LOC~~ HOSPITAL LAB): SARS Coronavirus 2: NEGATIVE

## 2021-06-06 LAB — GLUCOSE, CAPILLARY
Glucose-Capillary: 119 mg/dL — ABNORMAL HIGH (ref 70–99)
Glucose-Capillary: 122 mg/dL — ABNORMAL HIGH (ref 70–99)

## 2021-06-06 SURGERY — KYPHOPLASTY
Anesthesia: General

## 2021-06-06 MED ORDER — CHLORHEXIDINE GLUCONATE 0.12 % MT SOLN
15.0000 mL | Freq: Once | OROMUCOSAL | Status: AC
  Administered 2021-06-06: 15 mL via OROMUCOSAL
  Filled 2021-06-06: qty 15

## 2021-06-06 MED ORDER — DEXAMETHASONE SODIUM PHOSPHATE 4 MG/ML IJ SOLN
INTRAMUSCULAR | Status: DC | PRN
  Administered 2021-06-06: 8 mg via INTRAVENOUS

## 2021-06-06 MED ORDER — LIDOCAINE 2% (20 MG/ML) 5 ML SYRINGE
INTRAMUSCULAR | Status: DC | PRN
  Administered 2021-06-06: 60 mg via INTRAVENOUS

## 2021-06-06 MED ORDER — ACETAMINOPHEN 10 MG/ML IV SOLN: 1000.0000 mg | Freq: Once | INTRAVENOUS | Status: DC | PRN

## 2021-06-06 MED ORDER — FENTANYL CITRATE (PF) 100 MCG/2ML IJ SOLN: 25.0000 ug | INTRAMUSCULAR | Status: DC | PRN

## 2021-06-06 MED ORDER — SUCCINYLCHOLINE CHLORIDE 200 MG/10ML IV SOSY
PREFILLED_SYRINGE | INTRAVENOUS | Status: DC | PRN
  Administered 2021-06-06: 120 mg via INTRAVENOUS

## 2021-06-06 MED ORDER — BUPIVACAINE-EPINEPHRINE 0.25% -1:200000 IJ SOLN
INTRAMUSCULAR | Status: DC | PRN
  Administered 2021-06-06: 15 mL

## 2021-06-06 MED ORDER — PROPOFOL 10 MG/ML IV BOLUS
INTRAVENOUS | Status: DC | PRN
  Administered 2021-06-06: 120 mg via INTRAVENOUS

## 2021-06-06 MED ORDER — LACTATED RINGERS IV SOLN: INTRAVENOUS | Status: DC | PRN

## 2021-06-06 MED ORDER — CEFAZOLIN SODIUM-DEXTROSE 2-4 GM/100ML-% IV SOLN
INTRAVENOUS | Status: AC
  Filled 2021-06-06: qty 100

## 2021-06-06 MED ORDER — ORAL CARE MOUTH RINSE: 15.0000 mL | Freq: Once | OROMUCOSAL | Status: AC

## 2021-06-06 MED ORDER — MIDAZOLAM HCL 2 MG/2ML IJ SOLN
INTRAMUSCULAR | Status: AC
  Filled 2021-06-06: qty 2

## 2021-06-06 MED ORDER — FENTANYL CITRATE (PF) 250 MCG/5ML IJ SOLN
INTRAMUSCULAR | Status: AC
  Filled 2021-06-06: qty 5

## 2021-06-06 MED ORDER — LIDOCAINE-EPINEPHRINE 1 %-1:100000 IJ SOLN
INTRAMUSCULAR | Status: AC
  Filled 2021-06-06: qty 1

## 2021-06-06 MED ORDER — BUPIVACAINE-EPINEPHRINE (PF) 0.25% -1:200000 IJ SOLN
INTRAMUSCULAR | Status: AC
  Filled 2021-06-06: qty 30

## 2021-06-06 MED ORDER — CEFAZOLIN SODIUM-DEXTROSE 2-3 GM-%(50ML) IV SOLR
INTRAVENOUS | Status: DC | PRN
  Administered 2021-06-06: 2 g via INTRAVENOUS

## 2021-06-06 MED ORDER — LIDOCAINE-EPINEPHRINE 1 %-1:100000 IJ SOLN
INTRAMUSCULAR | Status: DC | PRN
  Administered 2021-06-06: 15 mL

## 2021-06-06 MED ORDER — LACTATED RINGERS IV SOLN: INTRAVENOUS | Status: DC

## 2021-06-06 MED ORDER — ONDANSETRON HCL 4 MG/2ML IJ SOLN
INTRAMUSCULAR | Status: DC | PRN
  Administered 2021-06-06: 4 mg via INTRAVENOUS

## 2021-06-06 MED ORDER — ONDANSETRON HCL 4 MG/2ML IJ SOLN
4.0000 mg | Freq: Once | INTRAMUSCULAR | Status: DC | PRN
Start: 2021-06-06 — End: 2021-06-06

## 2021-06-06 MED ORDER — MIDAZOLAM HCL 5 MG/5ML IJ SOLN
INTRAMUSCULAR | Status: DC | PRN
  Administered 2021-06-06 (×2): 1 mg via INTRAVENOUS

## 2021-06-06 MED ORDER — PROPOFOL 10 MG/ML IV BOLUS
INTRAVENOUS | Status: AC
  Filled 2021-06-06: qty 20

## 2021-06-06 MED ORDER — RIVAROXABAN 15 MG PO TABS: 15.0000 mg | ORAL_TABLET | Freq: Every day | ORAL | 5 refills | Status: DC

## 2021-06-06 MED ORDER — DEXAMETHASONE SODIUM PHOSPHATE 10 MG/ML IJ SOLN
INTRAMUSCULAR | Status: AC
  Filled 2021-06-06: qty 1

## 2021-06-06 MED ORDER — 0.9 % SODIUM CHLORIDE (POUR BTL) OPTIME
TOPICAL | Status: DC | PRN
  Administered 2021-06-06: 1000 mL

## 2021-06-06 SURGICAL SUPPLY — 43 items
ADH SKN CLS APL DERMABOND .7 (GAUZE/BANDAGES/DRESSINGS) ×1
APL SKNCLS STERI-STRIP NONHPOA (GAUZE/BANDAGES/DRESSINGS) ×1
BAG COUNTER SPONGE SURGICOUNT (BAG) ×2 IMPLANT
BAG SPNG CNTER NS LX DISP (BAG) ×1
BENZOIN TINCTURE PRP APPL 2/3 (GAUZE/BANDAGES/DRESSINGS) ×1 IMPLANT
BLADE SURG 15 STRL LF DISP TIS (BLADE) ×1 IMPLANT
BLADE SURG 15 STRL SS (BLADE) ×2
CEMENT KYPHON CX01A KIT/MIXER (Cement) ×1 IMPLANT
DECANTER SPIKE VIAL GLASS SM (MISCELLANEOUS) ×2 IMPLANT
DERMABOND ADVANCED (GAUZE/BANDAGES/DRESSINGS) ×1
DERMABOND ADVANCED .7 DNX12 (GAUZE/BANDAGES/DRESSINGS) ×1 IMPLANT
DRAPE C-ARM 42X72 X-RAY (DRAPES) ×2 IMPLANT
DRAPE INCISE IOBAN 66X45 STRL (DRAPES) IMPLANT
DRAPE LAPAROTOMY 100X72X124 (DRAPES) ×2 IMPLANT
DRAPE SURG 17X23 STRL (DRAPES) ×2 IMPLANT
DRAPE WARM FLUID 44X44 (DRAPES) ×1 IMPLANT
DRSG OPSITE POSTOP 3X4 (GAUZE/BANDAGES/DRESSINGS) ×1 IMPLANT
DURAPREP 26ML APPLICATOR (WOUND CARE) ×2 IMPLANT
GAUZE 4X4 16PLY ~~LOC~~+RFID DBL (SPONGE) ×1 IMPLANT
GAUZE SPONGE 4X4 12PLY STRL (GAUZE/BANDAGES/DRESSINGS) ×1 IMPLANT
GLOVE SRG 8 PF TXTR STRL LF DI (GLOVE) ×1 IMPLANT
GLOVE SURG LTX SZ8 (GLOVE) ×2 IMPLANT
GLOVE SURG UNDER POLY LF SZ7.5 (GLOVE) ×1 IMPLANT
GLOVE SURG UNDER POLY LF SZ8 (GLOVE) ×2
GOWN STRL REUS W/ TWL LRG LVL3 (GOWN DISPOSABLE) IMPLANT
GOWN STRL REUS W/ TWL XL LVL3 (GOWN DISPOSABLE) ×1 IMPLANT
GOWN STRL REUS W/TWL 2XL LVL3 (GOWN DISPOSABLE) IMPLANT
GOWN STRL REUS W/TWL LRG LVL3 (GOWN DISPOSABLE)
GOWN STRL REUS W/TWL XL LVL3 (GOWN DISPOSABLE) ×4
KIT BASIN OR (CUSTOM PROCEDURE TRAY) ×2 IMPLANT
KIT POSITION SURG JACKSON T1 (MISCELLANEOUS) ×2 IMPLANT
KIT TURNOVER KIT B (KITS) ×2 IMPLANT
NEEDLE HYPO 22GX1.5 SAFETY (NEEDLE) ×2 IMPLANT
NS IRRIG 1000ML POUR BTL (IV SOLUTION) ×2 IMPLANT
PACK SURGICAL SETUP 50X90 (CUSTOM PROCEDURE TRAY) ×2 IMPLANT
PAD ARMBOARD 7.5X6 YLW CONV (MISCELLANEOUS) ×6 IMPLANT
SPONGE T-LAP 4X18 ~~LOC~~+RFID (SPONGE) IMPLANT
STAPLER VISISTAT 35W (STAPLE) ×2 IMPLANT
SUT VIC AB 3-0 SH 8-18 (SUTURE) ×2 IMPLANT
TOWEL GREEN STERILE (TOWEL DISPOSABLE) IMPLANT
TOWEL GREEN STERILE FF (TOWEL DISPOSABLE) IMPLANT
TRAY KYPHOPAK 15/3 ONESTEP 1ST (MISCELLANEOUS) ×1 IMPLANT
WATER STERILE IRR 1000ML POUR (IV SOLUTION) ×2 IMPLANT

## 2021-06-06 NOTE — Anesthesia Preprocedure Evaluation (Addendum)
Anesthesia Evaluation  Patient identified by MRN, date of birth, ID band Patient awake    Reviewed: Allergy & Precautions, NPO status , Patient's Chart, lab work & pertinent test results  Airway Mallampati: II  TM Distance: >3 FB Neck ROM: Full    Dental no notable dental hx.    Pulmonary shortness of breath, sleep apnea ,    Pulmonary exam normal breath sounds clear to auscultation       Cardiovascular Exercise Tolerance: Good +CHF  Normal cardiovascular exam+ pacemaker + Cardiac Defibrillator  Rhythm:Regular Rate:Normal   1. Left ventricular ejection fraction, by estimation, is 40 to 45%. The  left ventricle has mildly decreased function. The left ventricle  demonstrates global hypokinesis. Left ventricular diastolic parameters are  indeterminate. There is the  interventricular septum is flattened in diastole ('D' shaped left  ventricle), consistent with right ventricular volume overload. The average  left ventricular global longitudinal strain is -12.0 %. The global  longitudinal strain is abnormal.  2. Right ventricular systolic function is normal. The right ventricular  size is mildly enlarged. There is normal pulmonary artery systolic  pressure.  3. Right atrial size was mildly dilated.  4. 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 No evidence of mitral stenosis. There is mild late systolic  prolapse of the medial segment of the anterior leaflet of the mitral  valve. MR PISA ERO 0.11cm2, MR regugitanat  volume 18cc.  5. 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 There is associated moderate tricuspid regurgitation but only  seen well in the parasternal views. This  also could represent a vegetation but if no clinical findings or symptoms  of endocarditis, this is most  consistent with ruptured chordae tendinae.   . The tricuspid valve is abnormal. Tricuspid valve regurgitation is  moderate.  6. The aortic valve is normal in structure. Aortic valve regurgitation is  not visualized. No aortic stenosis is present.  7. The inferior vena cava is normal in size with greater than 50%  respiratory variability, suggesting right atrial pressure of 3 mmHg.  8. Agitated saline contrast bubble study was negative, with no evidence  of any interatrial shunt.  9. Recommend TEE for futher evaulation of tricuspid valve.    Neuro/Psych negative neurological ROS  negative psych ROS   GI/Hepatic GERD  ,  Endo/Other    Renal/GU CRF and Renal InsufficiencyRenal disease  negative genitourinary   Musculoskeletal negative musculoskeletal ROS (+)   Abdominal   Peds negative pediatric ROS (+)  Hematology negative hematology ROS (+)   Anesthesia Other Findings   Reproductive/Obstetrics negative OB ROS                           Anesthesia Physical  Anesthesia Plan  ASA: 3  Anesthesia Plan: General   Post-op Pain Management:    Induction: Intravenous  PONV Risk Score and Plan: 2 and Treatment may vary due to age or medical condition, Ondansetron and Dexamethasone  Airway Management Planned: Oral ETT  Additional Equipment: None  Intra-op Plan:   Post-operative Plan: Extubation in OR  Informed Consent: I have reviewed the patients History and Physical, chart, labs and discussed the procedure including the risks, benefits and alternatives for the proposed anesthesia with the patient or authorized representative who has indicated his/her understanding and acceptance.  Plan Discussed with: CRNA, Surgeon and Anesthesiologist  Anesthesia Plan Comments: (Will have rep reprogram device/deactivate ICD given prone positioning. Given extreme sensitive to longer acting narcotics, will plan to use fentanyl only as needed in  small doses. Norton Blizzard, MD  )      Anesthesia Quick Evaluation

## 2021-06-06 NOTE — Anesthesia Postprocedure Evaluation (Signed)
Anesthesia Post Note  Patient: [name]  Procedure(s) Performed: KYPHOPLASTY LUMBAR ONE     Patient location during evaluation: PACU Anesthesia Type: General Level of consciousness: awake and alert Pain management: pain level controlled Vital Signs Assessment: post-procedure vital signs reviewed and stable Respiratory status: spontaneous breathing, nonlabored ventilation and respiratory function stable Cardiovascular status: blood pressure returned to baseline and stable Postop Assessment: no apparent nausea or vomiting Anesthetic complications: no   No notable events documented.  Last Vitals:  Vitals:   06/06/21 1300 06/06/21 1311  BP: (!) 99/55 91/64  Pulse: 79 78  Resp: 18 17  Temp: 36.5 C   SpO2: 100% 98%    Last Pain:  Vitals:   06/06/21 1300  TempSrc:   PainSc: Asleep                 Merlinda Frederick

## 2021-06-06 NOTE — Op Note (Signed)
   Providing Compassionate, Quality Care - Together  Date of service: 06/06/2021  PREOP DIAGNOSIS:  L1 osteoporotic compression fracture  POSTOP DIAGNOSIS: Same  PROCEDURE: L1 kyphoplasty with Medtronic cement Intraoperative use of fluoroscopy  SURGEON: Dr. Pieter Partridge C. Astin Rape, DO  ASSISTANT: None  ANESTHESIA: General Endotracheal  EBL: Minimal  SPECIMENS: None  DRAINS: None  COMPLICATIONS: None  CONDITION: Hemodynamically stable  HISTORY: [name] is a 65 y.o. female with a history of osteoporosis that had a fall in June.  Her work-up revealed an L1 compression fracture and she had severe pain that was resistant to pain medication and bracing.  Given her continued progressive severe pain, I recommended surgical intervention in the form of a kyphoplasty.  We discussed all risks, benefits, alternatives and expected outcomes.  She agreed to proceed.  Her bone scan revealed again acute L1 compression fracture.  PROCEDURE IN DETAIL: The patient was brought to the operating room. After induction of general anesthesia, the patient was positioned on the operative table in the prone position. All pressure points were meticulously padded.  Physician driven timeout was performed.  Biplanar fluoroscopy was lined up over the L1 vertebral body.  Skin incision was then marked out and prepped and draped in the usual sterile fashion.  Local anesthetic was placed in the soft tissues.  Using a Jamshidi, left pedicle was accessed using AP and lateral fluoroscopy.  Once into the vertebral body, drill was used to create a tract to midline.  The balloon was then placed and inflated and there was some reduction of the superior endplate fracture noted on x-ray.  Balloon was removed.  Cement was injected to get approximately two thirds of vertebral body fill with bilateral fill.  This was appreciated on fluoroscopy.  There was no extravasation noted.  The cement was allowed to set according to the  manufacturer's recommendations.  The Jamshidi was gently removed.  The wound was hemostatic.  Benzoin and Steri-Strips were placed over the stab site and sterile dressing was applied.  Final fluoroscopic images confirmed appropriate placement of cement in the L1 vertebral body without extravasation.  At the end of the case all sponge, needle, and instrument counts were correct. The patient was then transferred to the stretcher, extubated, and taken to the post-anesthesia care unit in stable hemodynamic condition.

## 2021-06-06 NOTE — Anesthesia Procedure Notes (Signed)
Procedure Name: Intubation Date/Time: 06/06/2021 12:02 PM Performed by: Yehuda Mao, CRNA Pre-anesthesia Checklist: Patient identified, Emergency Drugs available, Suction available and Patient being monitored Patient Re-evaluated:Patient Re-evaluated prior to induction Oxygen Delivery Method: Circle system utilized Preoxygenation: Pre-oxygenation with 100% oxygen Induction Type: IV induction Ventilation: Mask ventilation without difficulty Laryngoscope Size: Mac and 3 Grade View: Grade I Tube type: Oral Tube size: 7.0 mm Number of attempts: 1 Airway Equipment and Method: Patient positioned with wedge pillow and Stylet Placement Confirmation: ETT inserted through vocal cords under direct vision, positive ETCO2 and breath sounds checked- equal and bilateral Secured at: 20 cm Tube secured with: Tape Dental Injury: Teeth and Oropharynx as per pre-operative assessment

## 2021-06-06 NOTE — H&P (Signed)
Providing Compassionate, Quality Care - Together  NEUROSURGERY HISTORY & PHYSICAL   [name] is an 65 y.o. female.   Chief Complaint: Low back pain HPI: This is a 65 year old female with a history of osteopenia that has continued low back pain after an L1 compression fracture after a fall.  Her fall was in June and since then she has had severe low back pain.  She had tried Medrol Dosepak and pain control without significant relief of her pain.  Imaging revealed an L1 compression fracture, DEXA scan showed progression of her osteopenia and bone scan showed acute compression fracture at L1.  She could not have an MRI.  She does have a history of A. fib and is on Xarelto, her last dose was 06/03/2021  Past Medical History:  Diagnosis Date   AICD (automatic cardioverter/defibrillator) present    Biventricular implantable cardiac defibrillator)    Medtronic   Breast cancer ([place]) 1998   - left   CHF (congestive heart failure) (HCC)    takes Lasix and Aldactone daily   Chronic kidney disease    Complication of anesthesia    n/v, none at last SURGERY-2022.   GERD (gastroesophageal reflux disease)    takes Omeprazole daily   Gout    takes Allopurinol daily   History of bronchitis 2 yrs ago   History of shingles    Hyperlipidemia    takes Simvastatin daily   Insomnia    takes Ambien nightly   Nonischemic cardiomyopathy ([place])    EF 55% echo 2012   NSVT (nonsustained ventricular tachycardia) (HCC)    Obesity    OSA (obstructive sleep apnea)    Unable to wear CPAP   Pneumonia 2005   Presence of permanent cardiac pacemaker    Shortness of breath dyspnea    with exertion   sprint Fidelis     Past Surgical History:  Procedure Laterality Date   ABDOMINAL HYSTERECTOMY     APPENDECTOMY  09/07/1976   BiV ICD  09/07/2005   Medtronic   BIV ICD GENERTAOR CHANGE OUT N/A 12/16/2011   Procedure: BIV ICD [place];  Surgeon: [name], MD;  Location: [facility] CATH  LAB;  Service: Cardiovascular;  Laterality: N/A;   CHOLECYSTECTOMY  09/07/2017   COLONOSCOPY     cyst removed from wrist Left    early 70's   ICD LEAD REMOVAL Right 10/18/2014   Procedure: ICD LEAD REMOVAL/EXTRACTION/REIMPLANTATION;  Surgeon: Evans Lance, MD;  Location: [facility];  Service: Cardiovascular;  Laterality: Right;   MASTECTOMY Left 09/07/1996   port a cath placed  09/07/1996   port removed  09/07/1996   RIGHT HEART CATH N/A 06/20/2018   Procedure: RIGHT HEART CATH;  Surgeon: Jolaine Artist, MD;  Location: [facility] CV LAB;  Service: Cardiovascular;  Laterality: N/A;    Family History  Problem Relation Age of Onset   Diabetes Mother    Heart disease Father    Alzheimer's disease Father    Social History:  reports that she has never smoked. She has never used smokeless tobacco. She reports that she does not drink alcohol and does not use drugs.  Allergies:  Allergies  Allergen Reactions   Buprenorphine Hcl     Patient required emergent use of naloxone and naloxone drip in ICU for only taking Percocet 5-325 over the course of one day. She appears to have impaired metabolism of opioid medications and providers should use these medications with extreme caution.  Morphine And Related Other (See Comments)    Patient required emergent use of naloxone and naloxone drip in ICU for only taking Percocet 5-325 over the course of one day. She appears to have impaired metabolism of opioid medications and providers should use these medications with extreme caution. Patient required emergent use of naloxone and naloxone drip in ICU for only taking Percocet 5-325 over the course of one day. She appears to have impaired metabolism of opioid medications and providers should use these medications with extreme caution.   Percocet [Oxycodone-Acetaminophen] Shortness Of Breath    Hospitalized   Oxycodone Other (See Comments)    Mental status changes    Medications Prior to Admission   Medication Sig Dispense Refill   alendronate (FOSAMAX) 70 MG tablet Take 70 mg by mouth every Monday.     allopurinol (ZYLOPRIM) 100 MG tablet Take 100 mg by mouth daily.      amiodarone (PACERONE) 200 MG tablet Take 2 tablets (400 mg total) by mouth 2 (two) times daily for 14 days, THEN 1 tablet (200 mg total) 2 (two) times daily for 14 days, THEN 1 tablet (200 mg total) daily. 180 tablet 3   atorvastatin (LIPITOR) 10 MG tablet Take 10 mg by mouth at bedtime.     bisoprolol (ZEBETA) 5 MG tablet Take 1.5 tablets (7.5 mg total) by mouth daily. 135 tablet 3   buPROPion (WELLBUTRIN SR) 150 MG 12 hr tablet Take 150 mg by mouth 2 (two) times daily.     Cholecalciferol (VITAMIN D3) 5000 units TABS Take 5,000 Units by mouth daily.     citalopram (CELEXA) 20 MG tablet Take 20 mg by mouth daily.      empagliflozin (JARDIANCE) 10 MG TABS tablet Take 1 tablet (10 mg total) by mouth daily before breakfast. 30 tablet 11   losartan (COZAAR) 25 MG tablet Take 25 mg by mouth daily.     metolazone (ZAROXOLYN) 2.5 MG tablet TAKE 1 TABLET BY MOUTH ONCE A WEEK ON THURSDAYS 4 tablet 3   Multiple Vitamins-Minerals (ONE DAILY FOR WOMEN 50+ ADV PO) Take 1 tablet by mouth daily.     omeprazole (PRILOSEC) 20 MG capsule Take 20 mg by mouth daily.     potassium chloride SA (KLOR-CON M20) 20 MEQ tablet Take 3 tablets (60 mEq total) by mouth daily. (Patient taking differently: Take 20-40 mEq by mouth See admin instructions. Take 40 meq in the morning and 20 meq at lunch) 204 tablet 3   Rivaroxaban (XARELTO) 15 MG TABS tablet TAKE 1 TABLET (15 MG TOTAL) BY MOUTH DAILY WITH SUPPER. 30 tablet 5   spironolactone (ALDACTONE) 25 MG tablet Take 25 mg by mouth daily.     torsemide (DEMADEX) 20 MG tablet Take 40 mg by mouth daily.     venlafaxine XR (EFFEXOR-XR) 150 MG 24 hr capsule Take 150 mg by mouth daily with breakfast.     vitamin B-12 (CYANOCOBALAMIN) 100 MCG tablet Take 100 mcg by mouth daily.     zolpidem (AMBIEN) 10 MG  tablet Take 10 mg by mouth at bedtime.     albuterol (VENTOLIN HFA) 108 (90 Base) MCG/ACT inhaler Inhale 2 puffs into the lungs every 6 (six) hours as needed for wheezing or shortness of breath.     metoprolol tartrate (LOPRESSOR) 25 MG tablet Take 1 tablet (25 mg total) by mouth Once PRN for up to 1 dose (IF you heartrate is over 75 TAKE). 1 tablet 0    Results for orders placed  or performed during the hospital encounter of 06/06/21 (from the past 48 hour(s))  SARS Coronavirus 2 by RT PCR (hospital order, performed in [facility] hospital lab) Nasopharyngeal Nasopharyngeal Swab     Status: None   Collection Time: 06/06/21  7:44 AM   Specimen: Nasopharyngeal Swab  Result Value Ref Range   SARS Coronavirus 2 NEGATIVE NEGATIVE    Comment: (NOTE) SARS-CoV-2 target nucleic acids are NOT DETECTED.  The SARS-CoV-2 RNA is generally detectable in upper and lower respiratory specimens during the acute phase of infection. The lowest concentration of SARS-CoV-2 viral copies this assay can detect is 250 copies / mL. A negative result does not preclude SARS-CoV-2 infection and should not be used as the sole basis for treatment or other patient management decisions.  A negative result may occur with improper specimen collection / handling, submission of specimen other than nasopharyngeal swab, presence of viral mutation(s) within the areas targeted by this assay, and inadequate number of viral copies (<250 copies / mL). A negative result must be combined with clinical observations, patient history, and epidemiological information.  Fact Sheet for Patients:   StrictlyIdeas.no  Fact Sheet for Healthcare Providers: BankingDealers.co.za  This test is not yet approved or  cleared by the [place] FDA and has been authorized for detection and/or diagnosis of SARS-CoV-2 by FDA under an Emergency Use Authorization (EUA).  This EUA will remain in effect  (meaning this test can be used) for the duration of the COVID-19 declaration under Section 564(b)(1) of the Act, 21 U.S.C. section 360bbb-3(b)(1), unless the authorization is terminated or revoked sooner.  Performed at [facility], [place] [address], [place], [place] [address]   Glucose, capillary     Status: Abnormal   Collection Time: 06/06/21  8:07 AM  Result Value Ref Range   Glucose-Capillary 119 (H) 70 - 99 mg/dL    Comment: Glucose reference range applies only to samples taken after fasting for at least 8 hours.  Glucose, capillary     Status: Abnormal   Collection Time: 06/06/21 10:13 AM  Result Value Ref Range   Glucose-Capillary 122 (H) 70 - 99 mg/dL    Comment: Glucose reference range applies only to samples taken after fasting for at least 8 hours.   No results found.  ROS All positives and negatives are listed HPI above  Blood pressure (!) 111/44, pulse (!) 52, temperature (!) 97.5 F (36.4 C), temperature source Oral, resp. rate 17, height 5\' 1"  (1.549 m), weight 89.8 kg, SpO2 99 %. Physical Exam  A&O x3, mild distress due to pain PERRLA EOMI Bilateral upper/lower extremity full strength throughout TTP over upper lumbar spine and midline Sensory intact light touch throughout Cranial nerves II through XII intact  Assessment/Plan 65 year old female with  L1 compression fracture secondary to osteopenia/fall  -OR today for L1 kyphoplasty.  All risks benefits and expected outcomes were discussed and agreed upon.   Thank you for allowing me to participate in this patient's care.  Please do not hesitate to call with questions or concerns.   Elwin Sleight, [facility] Cell: [phone]

## 2021-06-06 NOTE — Transfer of Care (Signed)
Immediate Anesthesia Transfer of Care Note  Patient: [name]  Procedure(s) Performed: KYPHOPLASTY LUMBAR ONE  Patient Location: PACU  Anesthesia Type:General  Level of Consciousness: awake  Airway & Oxygen Therapy: Patient Spontanous Breathing  Post-op Assessment: Report given to RN and Post -op Vital signs reviewed and stable  Post vital signs: stable  Last Vitals:  Vitals Value Taken Time  BP 99/55 06/06/21 1257  Temp    Pulse 79 06/06/21 1300  Resp 18 06/06/21 1300  SpO2 100 % 06/06/21 1300  Vitals shown include unvalidated device data.  Last Pain:  Vitals:   06/06/21 0845  TempSrc:   PainSc: 0-No pain         Complications: No notable events documented.

## 2021-06-06 NOTE — Progress Notes (Signed)
Medtronic representative paged for surgery.

## 2021-06-06 NOTE — Progress Notes (Signed)
Leanna Sato from [place] [facility] returned the page, and he was made aware of the requests for surgery.

## 2021-06-06 NOTE — OR Nursing (Signed)
Kyphon cement lot [id number] used at surgical site

## 2021-06-09 ENCOUNTER — Telehealth (HOSPITAL_COMMUNITY): Payer: Self-pay | Admitting: *Deleted

## 2021-06-09 ENCOUNTER — Encounter (HOSPITAL_COMMUNITY): Payer: Self-pay | Admitting: Neurological Surgery

## 2021-06-09 NOTE — Telephone Encounter (Signed)
Pt left vm asking for Dr.Bensimhons opinion on her getting ablation and then watchman in the future. Pt said she is scheduled for ablation with Dr.Lambert but wanted to make sure Dr.Bensimhon agreed with the plan before she continued.   Routed to [place] for advice

## 2021-06-11 NOTE — Telephone Encounter (Signed)
Pt aware and thanked me for the call.  

## 2021-06-17 ENCOUNTER — Telehealth (HOSPITAL_COMMUNITY): Payer: Self-pay | Admitting: Emergency Medicine

## 2021-06-17 ENCOUNTER — Other Ambulatory Visit (HOSPITAL_COMMUNITY): Payer: Self-pay | Admitting: Emergency Medicine

## 2021-06-17 LAB — CBC WITH DIFFERENTIAL/PLATELET
Basophils Absolute: 0.1 10*3/uL (ref 0.0–0.2)
Basos: 1 %
EOS (ABSOLUTE): 0.1 10*3/uL (ref 0.0–0.4)
Eos: 1 %
Hematocrit: 36.5 % (ref 34.0–46.6)
Hemoglobin: 11.7 g/dL (ref 11.1–15.9)
Immature Grans (Abs): 0 10*3/uL (ref 0.0–0.1)
Immature Granulocytes: 0 %
Lymphocytes Absolute: 0.9 10*3/uL (ref 0.7–3.1)
Lymphs: 10 %
MCH: 32.7 pg (ref 26.6–33.0)
MCHC: 32.1 g/dL (ref 31.5–35.7)
MCV: 102 fL — ABNORMAL HIGH (ref 79–97)
Monocytes Absolute: 0.7 10*3/uL (ref 0.1–0.9)
Monocytes: 8 %
Neutrophils Absolute: 6.9 10*3/uL (ref 1.4–7.0)
Neutrophils: 80 %
Platelets: 296 10*3/uL (ref 150–450)
RBC: 3.58 x10E6/uL — ABNORMAL LOW (ref 3.77–5.28)
RDW: 13.9 % (ref 11.7–15.4)
WBC: 8.6 10*3/uL (ref 3.4–10.8)

## 2021-06-17 LAB — BASIC METABOLIC PANEL
BUN/Creatinine Ratio: 12 (ref 12–28)
BUN: 21 mg/dL (ref 8–27)
CO2: 24 mmol/L (ref 20–29)
Calcium: 10 mg/dL (ref 8.7–10.3)
Chloride: 97 mmol/L (ref 96–106)
Creatinine, Ser: 1.74 mg/dL — ABNORMAL HIGH (ref 0.57–1.00)
Glucose: 115 mg/dL — ABNORMAL HIGH (ref 70–99)
Potassium: 5 mmol/L (ref 3.5–5.2)
Sodium: 138 mmol/L (ref 134–144)
eGFR: 32 mL/min/{1.73_m2} — ABNORMAL LOW (ref >59)

## 2021-06-17 NOTE — Telephone Encounter (Signed)
Attempted to call patient regarding PV CTA appt on Monday and added IV hydration appt in the [facility] clinic at 12:00 noon to protect her kidneys.   Left detailed message and requested a call back to discuss  Marchia Bond RN Navigator Cardiac [facility] [phone] Office  [phone] Cell

## 2021-06-20 ENCOUNTER — Ambulatory Visit (HOSPITAL_COMMUNITY): Payer: Medicare PPO

## 2021-06-20 ENCOUNTER — Telehealth (HOSPITAL_COMMUNITY): Payer: Self-pay | Admitting: *Deleted

## 2021-06-20 ENCOUNTER — Encounter (HOSPITAL_COMMUNITY): Payer: Self-pay

## 2021-06-20 NOTE — Telephone Encounter (Signed)
Reaching out to patient to offer assistance regarding upcoming cardiac imaging study; pt verbalizes understanding of appt date/time, parking situation and where to check in, pre-test NPO status and medications ordered, and verified current allergies; name and call back number provided for further questions should they arise  Gordy Clement RN Navigator Cardiac [place] [phone] office [phone] cell  Patient aware to arrive at noon for IV hydration.  She will take 25mg  metoprolol tartrate 2 hours prior to cardiac CT scan if her HR is greater than 75bpm.

## 2021-06-23 ENCOUNTER — Ambulatory Visit (HOSPITAL_COMMUNITY)
Admission: RE | Admit: 2021-06-23 | Discharge: 2021-06-23 | Disposition: A | Discharge status: Home / Self Care | Payer: Medicare PPO | Source: Ambulatory Visit | Attending: Cardiology | Admitting: Cardiology

## 2021-06-23 DIAGNOSIS — N289 Disorder of kidney and ureter, unspecified: Secondary | ICD-10-CM | POA: Insufficient documentation

## 2021-06-23 DIAGNOSIS — I4819 Other persistent atrial fibrillation: Secondary | ICD-10-CM

## 2021-06-23 LAB — BASIC METABOLIC PANEL
Anion gap: 10 (ref 5–15)
BUN: 27 mg/dL — ABNORMAL HIGH (ref 8–23)
CO2: 33 mmol/L — ABNORMAL HIGH (ref 22–32)
Calcium: 10 mg/dL (ref 8.9–10.3)
Chloride: 97 mmol/L — ABNORMAL LOW (ref 98–111)
Creatinine, Ser: 1.62 mg/dL — ABNORMAL HIGH (ref 0.44–1.00)
GFR, Estimated: 35 mL/min — ABNORMAL LOW (ref >60)
Glucose, Bld: 141 mg/dL — ABNORMAL HIGH (ref 70–99)
Potassium: 3.4 mmol/L — ABNORMAL LOW (ref 3.5–5.1)
Sodium: 140 mmol/L (ref 135–145)

## 2021-06-23 MED ORDER — SODIUM CHLORIDE 0.9 % WEIGHT BASED INFUSION
3.0000 mL/kg/h | INTRAVENOUS | Status: AC
  Administered 2021-06-23: 3 mL/kg/h via INTRAVENOUS

## 2021-06-23 MED ORDER — IOHEXOL 350 MG/ML SOLN
80.0000 mL | Freq: Once | INTRAVENOUS | Status: AC | PRN
  Administered 2021-06-23: 80 mL via INTRAVENOUS

## 2021-06-23 MED ORDER — SODIUM CHLORIDE 0.9 % WEIGHT BASED INFUSION: 1.0000 mL/kg/h | INTRAVENOUS | Status: DC

## 2021-06-26 NOTE — Telephone Encounter (Signed)
Work up complete. 

## 2021-06-27 ENCOUNTER — Encounter (HOSPITAL_COMMUNITY): Payer: Medicare PPO

## 2021-06-27 ENCOUNTER — Ambulatory Visit (HOSPITAL_COMMUNITY): Admit: 2021-06-27 | Payer: Medicare PPO | Admitting: Cardiology

## 2021-06-27 SURGERY — ATRIAL FIBRILLATION ABLATION
Anesthesia: General

## 2021-06-30 ENCOUNTER — Other Ambulatory Visit: Payer: Self-pay

## 2021-06-30 ENCOUNTER — Ambulatory Visit (HOSPITAL_COMMUNITY): Payer: Medicare PPO | Admitting: Anesthesiology

## 2021-06-30 ENCOUNTER — Encounter (HOSPITAL_COMMUNITY): Payer: Self-pay | Admitting: Cardiology

## 2021-06-30 ENCOUNTER — Ambulatory Visit (HOSPITAL_BASED_OUTPATIENT_CLINIC_OR_DEPARTMENT_OTHER): Payer: Medicare PPO

## 2021-06-30 ENCOUNTER — Ambulatory Visit (HOSPITAL_COMMUNITY)
Admission: RE | Admit: 2021-06-30 | Discharge: 2021-07-01 | Disposition: A | Discharge status: Home / Self Care | Payer: Medicare PPO | Attending: Cardiology | Admitting: Cardiology

## 2021-06-30 ENCOUNTER — Encounter (HOSPITAL_COMMUNITY)
Admission: RE | Disposition: A | Discharge status: Home / Self Care | Payer: Medicare PPO | Source: Home / Self Care | Attending: Cardiology

## 2021-06-30 DIAGNOSIS — I071 Rheumatic tricuspid insufficiency: Secondary | ICD-10-CM | POA: Diagnosis not present

## 2021-06-30 DIAGNOSIS — G4733 Obstructive sleep apnea (adult) (pediatric): Secondary | ICD-10-CM | POA: Insufficient documentation

## 2021-06-30 DIAGNOSIS — Z20822 Contact with and (suspected) exposure to covid-19: Secondary | ICD-10-CM | POA: Insufficient documentation

## 2021-06-30 DIAGNOSIS — Z7901 Long term (current) use of anticoagulants: Secondary | ICD-10-CM | POA: Insufficient documentation

## 2021-06-30 DIAGNOSIS — Z7983 Long term (current) use of bisphosphonates: Secondary | ICD-10-CM | POA: Diagnosis not present

## 2021-06-30 DIAGNOSIS — Z9012 Acquired absence of left breast and nipple: Secondary | ICD-10-CM | POA: Insufficient documentation

## 2021-06-30 DIAGNOSIS — C50919 Malignant neoplasm of unspecified site of unspecified female breast: Secondary | ICD-10-CM | POA: Insufficient documentation

## 2021-06-30 DIAGNOSIS — Z8249 Family history of ischemic heart disease and other diseases of the circulatory system: Secondary | ICD-10-CM | POA: Insufficient documentation

## 2021-06-30 DIAGNOSIS — I5022 Chronic systolic (congestive) heart failure: Secondary | ICD-10-CM | POA: Diagnosis not present

## 2021-06-30 DIAGNOSIS — Z95810 Presence of automatic (implantable) cardiac defibrillator: Secondary | ICD-10-CM | POA: Insufficient documentation

## 2021-06-30 DIAGNOSIS — Z6837 Body mass index (BMI) 37.0-37.9, adult: Secondary | ICD-10-CM | POA: Diagnosis not present

## 2021-06-30 DIAGNOSIS — Z79899 Other long term (current) drug therapy: Secondary | ICD-10-CM | POA: Diagnosis not present

## 2021-06-30 DIAGNOSIS — N1832 Chronic kidney disease, stage 3b: Secondary | ICD-10-CM | POA: Diagnosis not present

## 2021-06-30 DIAGNOSIS — E669 Obesity, unspecified: Secondary | ICD-10-CM | POA: Diagnosis not present

## 2021-06-30 DIAGNOSIS — I4891 Unspecified atrial fibrillation: Secondary | ICD-10-CM

## 2021-06-30 DIAGNOSIS — I428 Other cardiomyopathies: Secondary | ICD-10-CM | POA: Diagnosis not present

## 2021-06-30 DIAGNOSIS — E785 Hyperlipidemia, unspecified: Secondary | ICD-10-CM | POA: Insufficient documentation

## 2021-06-30 DIAGNOSIS — Z885 Allergy status to narcotic agent status: Secondary | ICD-10-CM | POA: Diagnosis not present

## 2021-06-30 DIAGNOSIS — I3139 Other pericardial effusion (noninflammatory): Secondary | ICD-10-CM | POA: Insufficient documentation

## 2021-06-30 DIAGNOSIS — I4819 Other persistent atrial fibrillation: Secondary | ICD-10-CM | POA: Diagnosis not present

## 2021-06-30 HISTORY — PX: ATRIAL FIBRILLATION ABLATION: EP1191

## 2021-06-30 LAB — ECHOCARDIOGRAM COMPLETE
AR max vel: 3.08 cm2
AV Area VTI: 2.28 cm2
AV Area mean vel: 3.04 cm2
AV Mean grad: 4 mmHg
AV Peak grad: 6.5 mmHg
Ao pk vel: 1.27 m/s
Area-P 1/2: 3.6 cm2
Calc EF: 57.4 %
Height: 61 in
S' Lateral: 3.2 cm
Single Plane A2C EF: 58.5 %
Single Plane A4C EF: 52.6 %
Weight: 3168 oz

## 2021-06-30 LAB — SARS CORONAVIRUS 2 BY RT PCR (HOSPITAL ORDER, PERFORMED IN ~~LOC~~ HOSPITAL LAB): SARS Coronavirus 2: NEGATIVE

## 2021-06-30 LAB — POCT ACTIVATED CLOTTING TIME: Activated Clotting Time: 509 seconds

## 2021-06-30 SURGERY — ATRIAL FIBRILLATION ABLATION
Anesthesia: General

## 2021-06-30 MED ORDER — MIDAZOLAM HCL 5 MG/5ML IJ SOLN
INTRAMUSCULAR | Status: DC | PRN
  Administered 2021-06-30 (×2): 1 mg via INTRAVENOUS

## 2021-06-30 MED ORDER — SUGAMMADEX SODIUM 200 MG/2ML IV SOLN
INTRAVENOUS | Status: DC | PRN
  Administered 2021-06-30: 400 mg via INTRAVENOUS

## 2021-06-30 MED ORDER — EMPAGLIFLOZIN 10 MG PO TABS
10.0000 mg | ORAL_TABLET | Freq: Every day | ORAL | Status: DC
  Administered 2021-07-01: 10 mg via ORAL
  Filled 2021-06-30: qty 1

## 2021-06-30 MED ORDER — PROPOFOL 10 MG/ML IV BOLUS
INTRAVENOUS | Status: DC | PRN
  Administered 2021-06-30: 100 mg via INTRAVENOUS

## 2021-06-30 MED ORDER — SODIUM CHLORIDE 0.9 % IV SOLN: 250.0000 mL | INTRAVENOUS | Status: DC | PRN

## 2021-06-30 MED ORDER — SODIUM CHLORIDE 0.9% FLUSH: 3.0000 mL | INTRAVENOUS | Status: DC | PRN

## 2021-06-30 MED ORDER — HEPARIN (PORCINE) IN NACL 1000-0.9 UT/500ML-% IV SOLN
INTRAVENOUS | Status: DC | PRN
  Administered 2021-06-30 (×3): 500 mL

## 2021-06-30 MED ORDER — ROCURONIUM BROMIDE 10 MG/ML (PF) SYRINGE
PREFILLED_SYRINGE | INTRAVENOUS | Status: DC | PRN
  Administered 2021-06-30: 60 mg via INTRAVENOUS

## 2021-06-30 MED ORDER — SODIUM CHLORIDE 0.9 % IV SOLN: INTRAVENOUS | Status: DC

## 2021-06-30 MED ORDER — LIDOCAINE 2% (20 MG/ML) 5 ML SYRINGE
INTRAMUSCULAR | Status: DC | PRN
  Administered 2021-06-30: 100 mg via INTRAVENOUS

## 2021-06-30 MED ORDER — PROTAMINE SULFATE 10 MG/ML IV SOLN
INTRAVENOUS | Status: DC | PRN
  Administered 2021-06-30: 70 mg via INTRAVENOUS

## 2021-06-30 MED ORDER — PHENYLEPHRINE 40 MCG/ML (10ML) SYRINGE FOR IV PUSH (FOR BLOOD PRESSURE SUPPORT)
PREFILLED_SYRINGE | INTRAVENOUS | Status: DC | PRN
  Administered 2021-06-30 (×2): 80 ug via INTRAVENOUS

## 2021-06-30 MED ORDER — AMIODARONE HCL 200 MG PO TABS
200.0000 mg | ORAL_TABLET | Freq: Every day | ORAL | Status: DC
  Administered 2021-07-01: 200 mg via ORAL
  Filled 2021-06-30: qty 1

## 2021-06-30 MED ORDER — METOPROLOL TARTRATE 25 MG PO TABS: 25.0000 mg | ORAL_TABLET | Freq: Once | ORAL | Status: DC | PRN

## 2021-06-30 MED ORDER — CEFAZOLIN SODIUM-DEXTROSE 2-4 GM/100ML-% IV SOLN
2.0000 g | Freq: Once | INTRAVENOUS | Status: AC
  Administered 2021-06-30: 2 g via INTRAVENOUS

## 2021-06-30 MED ORDER — FENTANYL CITRATE (PF) 250 MCG/5ML IJ SOLN
INTRAMUSCULAR | Status: DC | PRN
  Administered 2021-06-30: 50 ug via INTRAVENOUS

## 2021-06-30 MED ORDER — RIVAROXABAN 15 MG PO TABS: 15.0000 mg | ORAL_TABLET | Freq: Every day | ORAL | Status: DC

## 2021-06-30 MED ORDER — HEPARIN SODIUM (PORCINE) 1000 UNIT/ML IJ SOLN
INTRAMUSCULAR | Status: DC | PRN
  Administered 2021-06-30: 13000 [IU] via INTRAVENOUS

## 2021-06-30 MED ORDER — CEFAZOLIN SODIUM-DEXTROSE 2-4 GM/100ML-% IV SOLN
INTRAVENOUS | Status: AC
  Filled 2021-06-30: qty 100

## 2021-06-30 MED ORDER — TORSEMIDE 20 MG PO TABS
40.0000 mg | ORAL_TABLET | Freq: Every day | ORAL | Status: DC
  Administered 2021-07-01: 40 mg via ORAL
  Filled 2021-06-30: qty 2

## 2021-06-30 MED ORDER — HEPARIN (PORCINE) IN NACL 2000-0.9 UNIT/L-% IV SOLN
INTRAVENOUS | Status: DC | PRN
  Administered 2021-06-30: 1000 mL

## 2021-06-30 MED ORDER — SODIUM CHLORIDE 0.9% FLUSH
3.0000 mL | Freq: Two times a day (BID) | INTRAVENOUS | Status: DC
  Administered 2021-06-30: 3 mL via INTRAVENOUS

## 2021-06-30 MED ORDER — RIVAROXABAN 15 MG PO TABS
15.0000 mg | ORAL_TABLET | Freq: Every day | ORAL | Status: DC
  Administered 2021-06-30: 15 mg via ORAL
  Filled 2021-06-30: qty 1

## 2021-06-30 MED ORDER — ONDANSETRON HCL 4 MG/2ML IJ SOLN
INTRAMUSCULAR | Status: DC | PRN
  Administered 2021-06-30: 4 mg via INTRAVENOUS

## 2021-06-30 MED ORDER — DEXAMETHASONE SODIUM PHOSPHATE 10 MG/ML IJ SOLN
INTRAMUSCULAR | Status: DC | PRN
  Administered 2021-06-30: 5 mg via INTRAVENOUS

## 2021-06-30 MED ORDER — TORSEMIDE 20 MG PO TABS: 40.0000 mg | ORAL_TABLET | Freq: Every day | ORAL | Status: DC

## 2021-06-30 MED ORDER — ZOLPIDEM TARTRATE 5 MG PO TABS
5.0000 mg | ORAL_TABLET | Freq: Every day | ORAL | Status: DC
  Administered 2021-06-30: 5 mg via ORAL
  Filled 2021-06-30: qty 1

## 2021-06-30 MED ORDER — ONDANSETRON HCL 4 MG/2ML IJ SOLN: 4.0000 mg | Freq: Four times a day (QID) | INTRAMUSCULAR | Status: DC | PRN

## 2021-06-30 MED ORDER — PHENYLEPHRINE HCL-NACL 20-0.9 MG/250ML-% IV SOLN
INTRAVENOUS | Status: DC | PRN
  Administered 2021-06-30: 40 ug/min via INTRAVENOUS

## 2021-06-30 MED ORDER — TORSEMIDE 20 MG PO TABS: 20.0000 mg | ORAL_TABLET | Freq: Every day | ORAL | Status: DC

## 2021-06-30 MED ORDER — BISOPROLOL FUMARATE 5 MG PO TABS
7.5000 mg | ORAL_TABLET | Freq: Every day | ORAL | Status: DC
  Administered 2021-07-01: 7.5 mg via ORAL
  Filled 2021-06-30: qty 2

## 2021-06-30 MED ORDER — VENLAFAXINE HCL ER 150 MG PO CP24
150.0000 mg | ORAL_CAPSULE | Freq: Every day | ORAL | Status: DC
  Administered 2021-07-01: 150 mg via ORAL
  Filled 2021-06-30: qty 1

## 2021-06-30 SURGICAL SUPPLY — 19 items
BLANKET WARM UNDERBOD FULL ACC (MISCELLANEOUS) ×2 IMPLANT
CATH OCTARAY 2.0 F 3-3-3-3-3 (CATHETERS) ×1 IMPLANT
CATH S CIRCA THERM PROBE 10F (CATHETERS) ×1 IMPLANT
CATH SMTCH THERMOCOOL SF DF (CATHETERS) ×1 IMPLANT
CATH SOUNDSTAR ECO 8FR (CATHETERS) ×1 IMPLANT
CLOSURE PERCLOSE PROSTYLE (VASCULAR PRODUCTS) ×3 IMPLANT
COVER SWIFTLINK CONNECTOR (BAG) ×2 IMPLANT
MAT PREVALON FULL STRYKER (MISCELLANEOUS) ×1 IMPLANT
PACK EP LATEX FREE (CUSTOM PROCEDURE TRAY) ×2
PACK EP LF (CUSTOM PROCEDURE TRAY) ×1 IMPLANT
PAD PRO RADIOLUCENT 2001M-C (PAD) ×2 IMPLANT
PATCH CARTO3 (PAD) ×1 IMPLANT
SHEATH BAYLIS TRANSSEPTAL 98CM (NEEDLE) ×1 IMPLANT
SHEATH CARTO VIZIGO SM CVD (SHEATH) ×1 IMPLANT
SHEATH INTRO SL0 8.5F 63 (SHEATH) ×1 IMPLANT
SHEATH PINNACLE 8F 10CM (SHEATH) ×2 IMPLANT
SHEATH PINNACLE 9F 10CM (SHEATH) ×1 IMPLANT
SHEATH PROBE COVER 6X72 (BAG) ×1 IMPLANT
TUBING SMART ABLATE COOLFLOW (TUBING) ×1 IMPLANT

## 2021-06-30 NOTE — Transfer of Care (Signed)
Immediate Anesthesia Transfer of Care Note  Patient: Maryann Alar  Procedure(s) Performed: ATRIAL FIBRILLATION ABLATION  Patient Location: Cath Lab  Anesthesia Type:General  Level of Consciousness: awake, alert  and oriented  Airway & Oxygen Therapy: Patient Spontanous Breathing and Patient connected to nasal cannula oxygen  Post-op Assessment: Report given to RN and Post -op Vital signs reviewed and stable  Post vital signs: Reviewed and stable  Last Vitals:  Vitals Value Taken Time  BP 89/31 06/30/21 1246  Temp    Pulse 65 06/30/21 1250  Resp 18 06/30/21 1250  SpO2 99 % 06/30/21 1250  Vitals shown include unvalidated device data.  Last Pain:  Vitals:   06/30/21 0846  TempSrc:   PainSc: 0-No pain         Complications: No notable events documented.

## 2021-06-30 NOTE — Anesthesia Procedure Notes (Signed)
Procedure Name: Intubation Date/Time: 06/30/2021 11:20 AM Performed by: Janene Harvey, CRNA Pre-anesthesia Checklist: Patient identified, Emergency Drugs available, Suction available and Patient being monitored Patient Re-evaluated:Patient Re-evaluated prior to induction Oxygen Delivery Method: Circle system utilized Preoxygenation: Pre-oxygenation with 100% oxygen Induction Type: IV induction Ventilation: Mask ventilation without difficulty and Oral airway inserted - appropriate to patient size Laryngoscope Size: Mac and 4 Grade View: Grade I Tube type: Oral Tube size: 7.0 mm Number of attempts: 1 Airway Equipment and Method: Stylet and Oral airway Placement Confirmation: ETT inserted through vocal cords under direct vision, positive ETCO2 and breath sounds checked- equal and bilateral Secured at: 21 cm Tube secured with: Tape Dental Injury: Teeth and Oropharynx as per pre-operative assessment

## 2021-06-30 NOTE — Anesthesia Preprocedure Evaluation (Addendum)
Anesthesia Evaluation  Patient identified by MRN, date of birth, ID band Patient awake    Reviewed: Allergy & Precautions, NPO status , Patient's Chart, lab work & pertinent test results  Airway Mallampati: II  TM Distance: >3 FB Neck ROM: Full    Dental no notable dental hx. (+) Teeth Intact, Dental Advisory Given   Pulmonary sleep apnea ,    Pulmonary exam normal breath sounds clear to auscultation       Cardiovascular +CHF  Normal cardiovascular exam+ dysrhythmias Atrial Fibrillation + pacemaker + Cardiac Defibrillator  Rhythm:Regular Rate:Normal  Non Ischemic cardiomyopathy 05/15/2021 TTE 1. Left ventricular ejection fraction, by estimation, is 40 to 45%. The  left ventricle has mildly decreased function. The left ventricle  demonstrates global hypokinesis. Left ventricular diastolic parameters are  indeterminate. There is the  interventricular septum is flattened in diastole ('D' shaped left  ventricle), consistent with right ventricular volume overload. The average  left ventricular global longitudinal strain is -12.0 %. The global  longitudinal strain is abnormal.  2. Right ventricular systolic function is normal. The right ventricular  size is mildly enlarged. There is normal pulmonary artery systolic  pressure.  3. Right atrial size was mildly dilated.  4. 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 No evidence of mitral stenosis. There is mild late systolic  prolapse of the medial segment of the anterior leaflet of the mitral  valve. MR PISA ERO 0.11cm2, MR regugitanat  volume 18cc.  5. 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 There is associated moderate tricuspid regurgitation but only  seen well in the parasternal views. This  also could represent a vegetation but if  no clinical findings or symptoms  of endocarditis, this is most consistent with ruptured chordae tendinae.   . The tricuspid valve is abnormal. Tricuspid valve regurgitation is  moderate.  6. The aortic valve is normal in structure. Aortic valve regurgitation is  not visualized. No aortic stenosis is present.  7. The inferior vena cava is normal in size with greater than 50%  respiratory variability, suggesting right atrial pressure of 3 mmHg.  8. Agitated saline contrast bubble study was negative, with no evidence  of any interatrial shunt.  9. Recommend TEE for futher evaulation of tricuspid valve.    Neuro/Psych negative neurological ROS  negative psych ROS   GI/Hepatic Neg liver ROS, GERD  Controlled and Medicated,  Endo/Other  negative endocrine ROS  Renal/GU Renal InsufficiencyRenal diseaseLab Results      Component                Value               Date                      CREATININE               1.62 (H)            06/23/2021                BUN                      27 (H)              06/23/2021  NA                       140                 06/23/2021                K                        3.4 (L)             06/23/2021                CL                       97 (L)              06/23/2021                CO2                      33 (H)              06/23/2021                Musculoskeletal negative musculoskeletal ROS (+)   Abdominal (+) + obese (BMI 37.41),   Peds  Hematology Lab Results      Component                Value               Date                      WBC                      8.6                 06/16/2021                HGB                      11.7                06/16/2021                HCT                      36.5                06/16/2021                MCV                      102 (H)             06/16/2021                PLT                      296                 06/16/2021              Anesthesia Other  Findings All: Buprenorphine, Morphine, Oxycodone  Reproductive/Obstetrics negative OB ROS  Anesthesia Physical Anesthesia Plan  ASA: 3  Anesthesia Plan: General   Post-op Pain Management:    Induction: Intravenous  PONV Risk Score and Plan: 4 or greater and Treatment may vary due to age or medical condition, Ondansetron and Midazolam  Airway Management Planned: Oral ETT  Additional Equipment: None  Intra-op Plan:   Post-operative Plan: Extubation in OR  Informed Consent: I have reviewed the patients History and Physical, chart, labs and discussed the procedure including the risks, benefits and alternatives for the proposed anesthesia with the patient or authorized representative who has indicated his/her understanding and acceptance.     Dental advisory given  Plan Discussed with:   Anesthesia Plan Comments: (GA)        Anesthesia Quick Evaluation

## 2021-06-30 NOTE — H&P (Signed)
Electrophysiology Office Note:     Date:  05/06/2021    ID:  Annita, Ratliff [birth year], MRN [id number]   PCP:  Wayna Chalet, NP         [facility] Cardiologist:  None  [facility] Electrophysiologist:  Virl Axe, MD    Referring MD: Wayna Chalet, NP    Chief Complaint: Atrial fibrillation and nonischemic cardiomyopathy   History of Present Illness:     [name] is a 65 y.o. female who presents for an evaluation of atrial fibrillation at the request of Dr. Caryl Comes. Their medical history includes CRT-D for nonischemic cardiomyopathy (previous lead fracture requiring extraction and revision) inappropriate shocks for rapidly conducted atrial fibrillation, chronic systolic heart failure, breast cancer treated with Adriamycin x4 cycles.  The patient last saw Dr. Caryl Comes on April 18, 2021.  At that appointment she was started on amiodarone.  Given frequent severe bruising and history of falls, the patient is referred to discuss ablation in addition to [facility] implant.       Past Medical History:  Diagnosis Date   AICD (automatic cardioverter/defibrillator) present     Biventricular implantable cardiac defibrillator)      Medtronic   Breast cancer [facility])      s/p mastectomy and chemo with adriamycin (1998)   CHF (congestive heart failure) (HCC)      takes Lasix and Aldactone daily   GERD (gastroesophageal reflux disease)      takes Omeprazole daily   Gout      takes Allopurinol daily   History of bronchitis 2 yrs ago   History of shingles     Hyperlipidemia      takes Simvastatin daily   Insomnia      takes Ambien nightly   Nonischemic cardiomyopathy ([place])      EF 55% echo 2012   NSVT (nonsustained ventricular tachycardia) (HCC)     Obesity     OSA (obstructive sleep apnea)      moderate OSA with an AHi of 18.6/hr with no significant central sleep apnea and O2 sats as low as 81%.  Now on auto CPAP.     Pneumonia 75yrs ago   Presence of  permanent cardiac pacemaker     Shortness of breath dyspnea      with exertion   sprint Fidelis             Past Surgical History:  Procedure Laterality Date   ABDOMINAL HYSTERECTOMY       APPENDECTOMY   1978   BiV ICD   2007    Medtronic   BIV ICD GENERTAOR CHANGE OUT N/A 12/16/2011    Procedure: BIV ICD GENERTAOR CHANGE OUT;  Surgeon: [name], MD;  Location: [facility] CATH LAB;  Service: Cardiovascular;  Laterality: N/A;   COLONOSCOPY       cyst removed from wrist Left      early 70's   ICD LEAD REMOVAL Right 10/18/2014    Procedure: ICD LEAD REMOVAL/EXTRACTION/REIMPLANTATION;  Surgeon: Evans Lance, MD;  Location: [place];  Service: Cardiovascular;  Laterality: Right;   MASTECTOMY Left 1998   port a cath placed   1998   port removed   [place] CATH N/A 06/20/2018    Procedure: RIGHT HEART CATH;  Surgeon: Jolaine Artist, MD;  Location: [place] CV LAB;  Service: Cardiovascular;  Laterality: N/A;      Current Medications: Active Medications      Current  Meds  Medication Sig   albuterol (VENTOLIN HFA) 108 (90 Base) MCG/ACT inhaler Inhale 2 puffs into the lungs every 6 (six) hours as needed for wheezing or shortness of breath.   alendronate (FOSAMAX) 70 MG tablet Take 70 mg by mouth every Monday.   allopurinol (ZYLOPRIM) 100 MG tablet Take 100 mg by mouth daily.    amiodarone (PACERONE) 200 MG tablet Take 2 tablets (400 mg total) by mouth 2 (two) times daily for 14 days, THEN 1 tablet (200 mg total) 2 (two) times daily for 14 days, THEN 1 tablet (200 mg total) daily.   atorvastatin (LIPITOR) 10 MG tablet Take 10 mg by mouth at bedtime.   bisoprolol (ZEBETA) 5 MG tablet Take 1.5 tablets (7.5 mg total) by mouth daily.   buPROPion (WELLBUTRIN SR) 150 MG 12 hr tablet Take 150 mg by mouth 2 (two) times daily.   Cholecalciferol (VITAMIN D3) 5000 units TABS Take 5,000 Units by mouth daily.   citalopram (CELEXA) 20 MG tablet Take 20 mg by mouth daily.    empagliflozin  (JARDIANCE) 10 MG TABS tablet Take 1 tablet (10 mg total) by mouth daily before breakfast.   losartan (COZAAR) 25 MG tablet Take 1 tablet (25 mg total) by mouth daily.   metolazone (ZAROXOLYN) 2.5 MG tablet TAKE 1 TABLET BY MOUTH ONCE A WEEK ON THURSDAYS   Multiple Vitamins-Minerals (ONE DAILY FOR WOMEN 50+ ADV PO) Take 1 tablet by mouth daily.   omeprazole (PRILOSEC) 20 MG capsule Take 20 mg by mouth daily.   potassium chloride SA (KLOR-CON M20) 20 MEQ tablet Take 3 tablets (60 mEq total) by mouth daily. (Patient taking differently: Take 60 mEq by mouth 2 (two) times daily.)   Rivaroxaban (XARELTO) 15 MG TABS tablet Take 1 tablet (15 mg total) by mouth daily with supper.   spironolactone (ALDACTONE) 25 MG tablet TAKE 1 TABLET EVERY DAY   torsemide (DEMADEX) 20 MG tablet Take 40 mg by mouth 2 (two) times daily.   venlafaxine XR (EFFEXOR-XR) 150 MG 24 hr capsule Take 150 mg by mouth daily with breakfast.   vitamin B-12 (CYANOCOBALAMIN) 100 MCG tablet Take 100 mcg by mouth daily.   zolpidem (AMBIEN) 10 MG tablet Take 10 mg by mouth at bedtime.        Allergies:   Buprenorphine hcl, Morphine and related, Percocet [oxycodone-acetaminophen], and Oxycodone    Social History         Socioeconomic History   Marital status: Married      Spouse name: Not on file   Number of children: Not on file   Years of education: Not on file   Highest education level: Not on file  Occupational History   Not on file  Tobacco Use   Smoking status: Never   Smokeless tobacco: Never  Vaping Use   Vaping Use: Never used  Substance and Sexual Activity   Alcohol use: No   Drug use: No   Sexual activity: Yes      Birth control/protection: None  Other Topics Concern   Not on file  Social History Narrative   Not on file    Social Determinants of Health    Financial Resource Strain: Not on file  Food Insecurity: Not on file  Transportation Needs: Not on file  Physical Activity: Not on file  Stress: Not  on file  Social Connections: Not on file      Family History: The patient's family history includes Alzheimer's disease in her father; Diabetes in  her mother; Heart disease in her father.   ROS:   Please see the history of present illness.    All other systems reviewed and are negative.   EKGs/Labs/Other Studies Reviewed:     The following studies were reviewed today: Prior notes  December 12, 2020 echo personally reviewed Left ventricular function moderately decreased, 40% Right ventricular function normal Dilated left and right atria Moderate TR   April 03, 2021 device interrogation personally reviewed BiV pacing 70%   EKG:  The ekg ordered today demonstrates biventricular paced rhythm, sinus rhythm.   Recent Labs: 02/10/2021: Magnesium 2.3 02/24/2021: B Natriuretic Peptide 326.4 04/03/2021: ALT 15; Hemoglobin 12.4; Platelets 266; TSH 5.600 04/18/2021: BUN 39; Creatinine, Ser 1.83; Potassium 3.3; Sodium 138  Recent Lipid Panel Labs (Brief)          Component Value Date/Time    CHOL 133 08/01/2014 1057    TRIG 94 08/01/2014 1057    HDL 43 08/01/2014 1057    CHOLHDL 3.1 08/01/2014 1057    VLDL 19 08/01/2014 1057    LDLCALC 71 08/01/2014 1057    LDLDIRECT 147.2 01/15/2009 0941        Physical Exam:     VS:  BP 96/74   Pulse 85   Ht 5\' 1"  (1.549 m)   Wt 200 lb (90.7 kg)   BMI 37.79 kg/m         Wt Readings from Last 3 Encounters:  05/06/21 200 lb (90.7 kg)  04/18/21 198 lb (89.8 kg)  04/14/21 196 lb (88.9 kg)      GEN:  Well nourished, well developed in no acute distress.  Obese HEENT: Normal NECK: No JVD; No carotid bruits LYMPHATICS: No lymphadenopathy CARDIAC: RRR, no murmurs, rubs, gallops RESPIRATORY:  Clear to auscultation without rales, wheezing or rhonchi  ABDOMEN: Soft, non-tender, non-distended MUSCULOSKELETAL:  No edema; No deformity  SKIN: Warm and dry NEUROLOGIC:  Alert and oriented x 3 PSYCHIATRIC:  Normal affect    ASSESSMENT:     1.  Persistent atrial fibrillation ([place])   2. Chronic systolic heart failure (HCC)   3. Obesity (BMI 30-39.9)   4. Nonischemic cardiomyopathy ([place])   5. OSA (obstructive sleep apnea)     PLAN:     In order of problems listed above:   1. Persistent atrial fibrillation (HCC) Symptomatic.  Contributing to a poor biventricular pacing percentage.  A rhythm control strategy is indicated.  I discussed options for rhythm control including antiarrhythmic drug therapy and ablation.  She is currently prescribed amiodarone but has not started taking medication yet given concerns for off target effects.  We discussed the risks and efficacy between the 2 strategies for rhythm control.  She would like to pursue ablation.  I discussed the risks, efficacy, recovery time after ablation.  I discussed the potential need for future ablations.   Ablation strategy would be PVI plus posterior wall ablation.  Lysbeth Galas T. Quentin Ore, MD, [facility], [facility] Cardiac Electrophysiology    -----------------------------------------------   I have seen, examined the patient, and reviewed the above assessment and plan.    Plan for PVI and posterior wall ablation.   Vickie Epley, MD 06/30/2021 10:41 AM

## 2021-06-30 NOTE — Progress Notes (Addendum)
Dr Quentin Ore at bedside, aware of decreased BP, patient to finish IVFs from procedure, safety maintained

## 2021-06-30 NOTE — Anesthesia Postprocedure Evaluation (Signed)
Anesthesia Post Note  Patient: [name]  Procedure(s) Performed: ATRIAL FIBRILLATION ABLATION     Patient location during evaluation: PACU Anesthesia Type: General Level of consciousness: awake and alert Pain management: pain level controlled Vital Signs Assessment: post-procedure vital signs reviewed and stable Respiratory status: spontaneous breathing, nonlabored ventilation, respiratory function stable and patient connected to nasal cannula oxygen Cardiovascular status: blood pressure returned to baseline and stable Postop Assessment: no apparent nausea or vomiting Anesthetic complications: no   No notable events documented.  Last Vitals:  Vitals:   06/30/21 1305 06/30/21 1310  BP: (!) 97/29 (!) 90/28  Pulse: 65 64  Resp: 18 12  Temp:    SpO2: (!) 87% (!) 87%    Last Pain:  Vitals:   06/30/21 1245  TempSrc: Temporal  PainSc: 0-No pain                 Barnet Glasgow

## 2021-06-30 NOTE — Progress Notes (Signed)
  Echocardiogram 2D Echocardiogram has been performed.  Merrie Roof F 06/30/2021, 2:16 PM

## 2021-07-01 ENCOUNTER — Encounter (HOSPITAL_COMMUNITY): Payer: Self-pay | Admitting: Cardiology

## 2021-07-01 ENCOUNTER — Encounter (HOSPITAL_COMMUNITY): Payer: Medicare PPO

## 2021-07-01 ENCOUNTER — Ambulatory Visit (INDEPENDENT_AMBULATORY_CARE_PROVIDER_SITE_OTHER): Payer: Medicare PPO

## 2021-07-01 DIAGNOSIS — I5022 Chronic systolic (congestive) heart failure: Secondary | ICD-10-CM | POA: Diagnosis not present

## 2021-07-01 DIAGNOSIS — I3139 Other pericardial effusion (noninflammatory): Secondary | ICD-10-CM | POA: Diagnosis not present

## 2021-07-01 DIAGNOSIS — I4891 Unspecified atrial fibrillation: Secondary | ICD-10-CM | POA: Diagnosis not present

## 2021-07-01 DIAGNOSIS — I428 Other cardiomyopathies: Secondary | ICD-10-CM | POA: Diagnosis not present

## 2021-07-01 DIAGNOSIS — I4819 Other persistent atrial fibrillation: Secondary | ICD-10-CM | POA: Diagnosis not present

## 2021-07-01 DIAGNOSIS — I071 Rheumatic tricuspid insufficiency: Secondary | ICD-10-CM | POA: Diagnosis not present

## 2021-07-01 LAB — CUP PACEART REMOTE DEVICE CHECK
Battery Remaining Longevity: 10 mo
Battery Voltage: 2.81 V
Brady Statistic AP VP Percent: 19.87 %
Brady Statistic AP VS Percent: 0.01 %
Brady Statistic AS VP Percent: 80.03 %
Brady Statistic AS VS Percent: 0.09 %
Brady Statistic RA Percent Paced: 19.81 %
Brady Statistic RV Percent Paced: 99.82 %
Date Time Interrogation Session: 20221025143325
HighPow Impedance: 70 Ohm
Implantable Lead Implant Date: 20070821
Implantable Lead Implant Date: 20070821
Implantable Lead Implant Date: 20160211
Implantable Lead Location: 753858
Implantable Lead Location: 753859
Implantable Lead Location: 753860
Implantable Lead Model: 4194
Implantable Lead Model: 5076
Implantable Lead Model: 6935
Implantable Pulse Generator Implant Date: 20160211
Lead Channel Impedance Value: 133 Ohm
Lead Channel Impedance Value: 399 Ohm
Lead Channel Impedance Value: 437 Ohm
Lead Channel Impedance Value: 627 Ohm
Lead Channel Impedance Value: 665 Ohm
Lead Channel Impedance Value: 722 Ohm
Lead Channel Pacing Threshold Amplitude: 0.5 V
Lead Channel Pacing Threshold Amplitude: 1.125 V
Lead Channel Pacing Threshold Amplitude: 2.125 V
Lead Channel Pacing Threshold Pulse Width: 0.4 ms
Lead Channel Pacing Threshold Pulse Width: 0.4 ms
Lead Channel Pacing Threshold Pulse Width: 0.4 ms
Lead Channel Sensing Intrinsic Amplitude: 2.125 mV
Lead Channel Sensing Intrinsic Amplitude: 2.125 mV
Lead Channel Sensing Intrinsic Amplitude: 23.625 mV
Lead Channel Sensing Intrinsic Amplitude: 23.625 mV
Lead Channel Setting Pacing Amplitude: 1.5 V
Lead Channel Setting Pacing Amplitude: 1.75 V
Lead Channel Setting Pacing Amplitude: 3.75 V
Lead Channel Setting Pacing Pulse Width: 0.4 ms
Lead Channel Setting Pacing Pulse Width: 0.4 ms
Lead Channel Setting Sensing Sensitivity: 0.3 mV

## 2021-07-01 MED ORDER — POTASSIUM CHLORIDE CRYS ER 20 MEQ PO TBCR: 40.0000 meq | EXTENDED_RELEASE_TABLET | Freq: Two times a day (BID) | ORAL | 3 refills | Status: DC

## 2021-07-01 NOTE — Discharge Instructions (Signed)
Post procedure care instructions No driving for 4 days. No lifting over 5 lbs for 1 week. No vigorous or sexual activity for 1 week. You may return to work/your usual activities on 07/08/21. Keep procedure site clean & dry. If you notice increased pain, swelling, bleeding or pus, call/return!  You may shower after 24 hours, but no soaking in baths/hot tubs/pools for 1 week.   One of our nurse coordinators will be reaching out to you to start the process of Watchman procedure planning

## 2021-07-01 NOTE — Discharge Summary (Signed)
ELECTROPHYSIOLOGY PROCEDURE DISCHARGE SUMMARY    Patient ID: [name],  MRN: [id number], DOB/AGE: 67-May-1957 67 y.o.  Admit date: 06/30/2021 Discharge date: 07/01/2021  Primary Care Physician: Wayna Chalet, NP  Primary Cardiologist: Dr. Haroldine Laws Electrophysiologist: Drs. Evette Cristal  Primary Discharge Diagnosis:  persistent Atrial fibrillation      CHA2DS2Vasc is 3, on xarelto  Secondary Discharge Diagnosis:  NICM Has had some recovery of LVEF waxes/wanes Chronic CHF (systolic) CRT-D Obesity Breast cancer  Treated with 1998 treated with Adriamycin x 4 cycles 6.   CKD (IIIb)  Procedures This Admission:  1.  Electrophysiology study aborted See procedure note for full details    Brief HPI: [name] is a 65 y.o. female with a history as above and of persistent atrial fibrillation.  They have failed medical therapy with amiodarone. Risks, benefits, and alternatives to catheter ablation of atrial fibrillation were reviewed with the patient who wished to proceed.  The patient underwent cardiac CT prior to the procedure which demonstrated no LAA thrombus.    Hospital Course:  The patient was admitted for plans of EPS/RFCA of atrial fibrillation.  The procedure however aborted 2/2 pericardial fluid observed with ICE after transseptal puncture.  Was decided to abort the procedure and get TTE for further evaluation.  (See procedure note).  TTE noted no pericardial effusion, known NICM.  She was monitored on telemetry overnight which demonstrated AV pacing.  Groin was without complication on the day of discharge.  The patient feels well today, denies CP, SOB, site discomfort.  She was examined by Dr. Quentin Ore and considered to be stable for discharge.  Wound care and restrictions were reviewed with the patient.  She will follow up with Dr. Quentin Ore in a few week to further plan for watchman, I have sent a message to the structural heart team RN.   Physical  Exam: Vitals:   06/30/21 1545 06/30/21 2027 07/01/21 0641 07/01/21 0740  BP: (!) 97/40 (!) 106/54 (!) 96/52 (!) 101/52  Pulse: 72 73 69 66  Resp: (!) 22 18 18    Temp:  98.4 F (36.9 C) 98 F (36.7 C) 98.2 F (36.8 C)  TempSrc:  Oral Oral Oral  SpO2: 93% 97% 93% 96%  Weight:      Height:        GEN- The patient is well appearing, alert and oriented x 3 today.   HEENT: normocephalic, atraumatic; sclera clear, conjunctiva pink; hearing intact; oropharynx clear; neck supple  Lungs- CTA b/l, normal work of breathing.  No wheezes, rales, rhonchi Heart- RRR, no murmurs, rubs or gallops  GI- soft, non-tender, non-distended, bowel sounds present  Extremities- no clubbing, cyanosis, or edema; DP/PT/radial pulses 2+ bilaterally, groin site is without hematoma/bruit MS- no significant deformity or atrophy Skin- warm and dry, no rash or lesion Psych- euthymic mood, full affect Neuro- strength and sensation are intact   Labs:   Lab Results  Component Value Date   WBC 8.6 06/16/2021   HGB 11.7 06/16/2021   HCT 36.5 06/16/2021   MCV 102 (H) 06/16/2021   PLT 296 06/16/2021   No results for input(s): NA, K, CL, CO2, BUN, CREATININE, CALCIUM, PROT, BILITOT, ALKPHOS, ALT, AST, GLUCOSE in the last 168 hours.  Invalid input(s): LABALBU   Discharge Medications:  Allergies as of 07/01/2021       Reactions   Buprenorphine Hcl    Patient required emergent use of naloxone and naloxone drip in ICU for only taking Percocet 5-325  over the course of one day. She appears to have impaired metabolism of opioid medications and providers should use these medications with extreme caution.   Morphine And Related Other (See Comments)   Patient required emergent use of naloxone and naloxone drip in ICU for only taking Percocet 5-325 over the course of one day. She appears to have impaired metabolism of opioid medications and providers should use these medications with extreme caution. Patient required  emergent use of naloxone and naloxone drip in ICU for only taking Percocet 5-325 over the course of one day. She appears to have impaired metabolism of opioid medications and providers should use these medications with extreme caution.   Percocet [oxycodone-acetaminophen] Shortness Of Breath   Hospitalized   Oxycodone Other (See Comments)   Mental status changes        Medication List     TAKE these medications    alendronate 70 MG tablet Commonly known as: FOSAMAX Take 70 mg by mouth every Monday.   allopurinol 100 MG tablet Commonly known as: ZYLOPRIM Take 100 mg by mouth daily.   amiodarone 200 MG tablet Commonly known as: PACERONE Take 2 tablets (400 mg total) by mouth 2 (two) times daily for 14 days, THEN 1 tablet (200 mg total) 2 (two) times daily for 14 days, THEN 1 tablet (200 mg total) daily. Start taking on: April 19, 2021 What changed: See the new instructions.   atorvastatin 10 MG tablet Commonly known as: LIPITOR Take 10 mg by mouth at bedtime.   bisoprolol 5 MG tablet Commonly known as: ZEBETA Take 1.5 tablets (7.5 mg total) by mouth daily.   buPROPion 150 MG 12 hr tablet Commonly known as: WELLBUTRIN SR Take 150 mg by mouth 2 (two) times daily.   empagliflozin 10 MG Tabs tablet Commonly known as: Jardiance Take 1 tablet (10 mg total) by mouth daily before breakfast.   metolazone 2.5 MG tablet Commonly known as: ZAROXOLYN TAKE 1 TABLET BY MOUTH ONCE A WEEK ON THURSDAYS What changed: See the new instructions.   metoprolol tartrate 25 MG tablet Commonly known as: LOPRESSOR Take 1 tablet (25 mg total) by mouth Once PRN for up to 1 dose (IF you heartrate is over 75 TAKE).   omeprazole 20 MG capsule Commonly known as: PRILOSEC Take 20 mg by mouth daily.   ONE DAILY FOR WOMEN 50+ ADV PO Take 1 tablet by mouth daily.   potassium chloride SA 20 MEQ tablet Commonly known as: [place]-Con M20 Take 2 tablets (40 mEq total) by mouth 2 (two) times  daily. What changed:  how much to take when to take this   Rivaroxaban 15 MG Tabs tablet Commonly known as: Xarelto Take 1 tablet (15 mg total) by mouth daily.   torsemide 20 MG tablet Commonly known as: DEMADEX Take 20-40 mg by mouth See admin instructions. 40 mg in the morning, 20 mg at lunch   venlafaxine XR 150 MG 24 hr capsule Commonly known as: EFFEXOR-XR Take 150 mg by mouth daily with breakfast.   vitamin B-12 100 MCG tablet Commonly known as: CYANOCOBALAMIN Take 100 mcg by mouth daily.   Vitamin D3 125 MCG (5000 UT) Tabs Take 5,000 Units by mouth daily.   zolpidem 10 MG tablet Commonly known as: AMBIEN Take 10 mg by mouth at bedtime.        Disposition: Home Discharge Instructions     Diet - low sodium heart healthy   Complete by: As directed    Increase activity slowly  Complete by: As directed        Follow-up Information     Vickie Epley, MD Follow up.   Specialties: Cardiology, Radiology Why: 07/29/21 @ 2:30PM Contact information: [address] [id number] [phone]                 Duration of Discharge Encounter: Greater than 30 minutes including physician time.  Venetia Night, PA-C 07/01/2021 9:03 AM

## 2021-07-10 NOTE — Progress Notes (Signed)
Remote ICD transmission.   

## 2021-07-15 NOTE — Progress Notes (Signed)
ADVANCED HF CLINIC NOTE  PCP: Clinton Quant at [facility] Cardiologist: Dr. Fransico Him HF cardiologist: Dr. Haroldine Laws  HPI: [name] is a 65 y.o. woman with history of breast CA in 1998 treated with Adriamycin x 4 cycles. In 2007, diagnosed with CHF with EF 25-35%. She is s/p Medtronic BiV-ICD in 2008 with Sprint Fidelis lead which was subsequently replaced. LV function has subsequently recovered  Had cath 12/10. Coronaries normal. Right atrial pressure mean of 7, RV pressure 28/6 . Pulmonary artery pressure was 23/9 with mean of 15.  Pulmonary capillary wedge pressure mean of 9. Fick cardiac output 3.9 L per minute.  Cardiac index 2.2 L per minute per meter squared.  Pulmonary vascular resistance was 1.5 [place] units.  [place] 2019  RA = 11 RV = 27/5 PA = 24/3 (17) PCW = 9 Fick cardiac output/index = 4.8/2.5 PVR = 1.8 WU Ao sat = 98% PA sat = 66%, 67%  Echo 2/21 EF 50%-55% mild to moderate TR. Personally reviewed  In 9/21 admitted to [place] with Covid (despite vaccination/boosting) was there for 6 days and got better. In 12/21 started having falls. SBP in 50s. Found to be septic with AKI.  BCx negative. Stopped all HF meds and diuretics. Gained 10 pounds.  Echo done EF 35-40%  Seen for HF follow 09/13/20. Volume overloaded by ICD interrogation. Torsemide was increased to 60 mg bid, losartan and carvedilol restarted.   Has had more AF recently on device and started on Eliquis by Dr. Caryl Comes. Had inappropriate ICD shocks for AF. Eliquis stopped and Xarelto started due to increased bruising.  Echo 12/12/20 EF 40-45% with dyssynchrony. RV ok   Admitted 10/22 for afib ablation but procedure aborted 2/2 pericardial fluid observed after transseptal puncture. Effusion stable on follow up echo. Plans for Watchman in the future.  Today she returns for HF follow up. She gets SOB walking on flat ground but can do ADLs without dyspnea. Overall feeling fine. Denies CP, dizziness,  edema, or PND/Orthopnea. Appetite ok. No fever or chills. Weight at home 198 pounds. Taking all medications. Has Watchman scheduled. Drinking a lot of water now due to dry mouth. She has been off spiro and losartan due to worsening kidney function.  ICD interrogation: OptiVol up above threshold, thoracic impedence down, no AF since 9/22, >99% v-pacing, no VT, 3-4 hrs daily activity (personally reviewed).  Studies: Echo 10/22: 25-30%, severe LV dysfunction, RV ok, mod/severe TR. Echo 12/21: EF 35-40% (in setting of sepsis in [place]). Echo 2/21: EF 50%-55% mild to moderate TR Echo 9/19: EF 40-45% Echo 11/17: EF 50-55%  Echo 6/16: EF 45-50% Echo 11/15: EF 50% inferior HK GLS -17.8 Echo 8/14: EF 50% Echo 5/11: EF 50-55%  Echo 4/13 EF 50%. RV normal.  12/16/11 ICD generator replaced due to EOL  ROS: All systems negative except as listed in HPI, PMH and Problem List.  Past Medical History:  Diagnosis Date   AICD (automatic cardioverter/defibrillator) present    Biventricular implantable cardiac defibrillator)    Medtronic   Breast cancer ([place]) 1998   - left   CHF (congestive heart failure) ([place])    takes Lasix and Aldactone daily   Chronic kidney disease    Complication of anesthesia    n/v, none at last SURGERY-2022.   GERD (gastroesophageal reflux disease)    takes Omeprazole daily   Gout    takes Allopurinol daily  History of bronchitis 2 yrs ago   History of shingles    Hyperlipidemia    takes Simvastatin daily   Insomnia    takes Ambien nightly   Nonischemic cardiomyopathy ([place])    EF 55% echo 2012   NSVT (nonsustained ventricular tachycardia)    Obesity    OSA (obstructive sleep apnea)    Unable to wear CPAP   Pneumonia 2005   Presence of permanent cardiac pacemaker    Shortness of breath dyspnea    with exertion   sprint Fidelis    Current Outpatient Medications  Medication Sig Dispense Refill   alendronate (FOSAMAX) 70 MG tablet Take 70 mg by mouth every  Monday.     allopurinol (ZYLOPRIM) 100 MG tablet Take 100 mg by mouth daily.      amiodarone (PACERONE) 200 MG tablet Take 200 mg by mouth daily.     atorvastatin (LIPITOR) 10 MG tablet Take 10 mg by mouth at bedtime.     bisoprolol (ZEBETA) 5 MG tablet Take 1.5 tablets (7.5 mg total) by mouth daily. 135 tablet 3   buPROPion (WELLBUTRIN SR) 150 MG 12 hr tablet Take 150 mg by mouth 2 (two) times daily.     Cholecalciferol (VITAMIN D3) 5000 units TABS Take 5,000 Units by mouth daily.     empagliflozin (JARDIANCE) 10 MG TABS tablet Take 1 tablet (10 mg total) by mouth daily before breakfast. 30 tablet 11   metolazone (ZAROXOLYN) 2.5 MG tablet TAKE 1 TABLET BY MOUTH ONCE A WEEK ON THURSDAYS 4 tablet 3   Multiple Vitamins-Minerals (ONE DAILY FOR WOMEN 50+ ADV PO) Take 1 tablet by mouth daily.     omeprazole (PRILOSEC) 20 MG capsule Take 20 mg by mouth daily.     potassium chloride SA (KLOR-CON M20) 20 MEQ tablet Take 2 tablets (40 mEq total) by mouth 2 (two) times daily. 120 tablet 3   Rivaroxaban (XARELTO) 15 MG TABS tablet Take 1 tablet (15 mg total) by mouth daily. 30 tablet 5   torsemide (DEMADEX) 20 MG tablet Take 20-40 mg by mouth See admin instructions. 40 mg in the morning, 20 mg at lunch     venlafaxine XR (EFFEXOR-XR) 150 MG 24 hr capsule Take 150 mg by mouth daily with breakfast.     vitamin B-12 (CYANOCOBALAMIN) 100 MCG tablet Take 100 mcg by mouth daily.     zolpidem (AMBIEN) 10 MG tablet Take 10 mg by mouth at bedtime.     No current facility-administered medications for this encounter.   BP 102/70   Pulse 72   Wt 91.4 kg   SpO2 94%   BMI 38.05 kg/m   Wt Readings from Last 3 Encounters:  07/16/21 91.4 kg  06/30/21 89.8 kg  06/23/21 89.8 kg   PHYSICAL EXAM: General:  NAD. No resp difficulty HEENT: Normal Neck: Supple. JVP 7-8. Carotids 2+ bilat; no bruits. No lymphadenopathy or thryomegaly appreciated. Cor: PMI nondisplaced. Regular rate & rhythm. No rubs, gallops, 2/6  TR Lungs: Clear Abdomen: Obese,  nontender, nondistended. No hepatosplenomegaly. No bruits or masses. Good bowel sounds. Extremities: No cyanosis, clubbing, rash, 1-2+ BLE edema Neuro: Alert & oriented x 3, cranial nerves grossly intact. Moves all 4 extremities w/o difficulty. Affect pleasant.  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 EF 50-55% on echo 11/17 - Echo 9/19 EF 45% with septal dyssynergy (despite biv pacing). - Echo 2/21 EF 50-55% mild to moderate TR - RHC in 10/19 with relatively normal  hemodynamics except for RA 10 (PCWP was 9)  - Echo 12/21 at [place] EF 35-40% in setting of sepsis. - Echo 12/12/20 EF 40-45% with dyssynchrony. RV ok  - Echo 10/22 EF 25-30%, severe LV dysfunction, RV ok, mod/severe TR. - NYHA II, volume up on OptiVol, weight up a few lbs. - Volume status improved.  - Increase torsemide to 40 mg bid and continue metolazone every Monday and Thursday(with KCl 40).  - Decrease bisoprolol to 5 mg daily to allow room for diuresis and further addition of GDMT. - Will ask device RN to send transmission in 1 week to follow fluid levels. - Off spiro & losartan with elevated SCr. - Plan to add back spiro pending labs. - Continue Jardiance 10 mg daily. - Check labs today. Repeat BMET in 10 days. - Newly reduced EF on recent echo. Discussed with Dr. Haroldine Laws, not candidate for advanced therapies. Continue GDMT as able.  2) Medtronic BiV ICD:  - ICD interrogated as above  3) HTN - Blood pressure on low end. - Medication changes as above.   4) OSA - Sleep study with moderate OSA.  - Following with Dr. Radford Pax. Unable to tolerate any CPAP. - Dr Radford Pax referred to ENT for [facility] Device.   5) PAF (SCAF) - CHA2DS2-VASc 4. - Unable to complete ablation. Watchman scheduled 08/21/21 - Continue Xarelto 15 mg daily. No bleeding issues. - Continue amiodarone 200 mg daily.  6) Obesity Body mass index is 38.05 kg/m. -  stable  7) CKD 3b - Continue Jardiance. - Labs today. - Plan to add back ARB as able.  Follow up with APP in 4 weeks to medication titration (add spiro or losartan) and with Dr. Haroldine Laws in 3 months +/- echo.  Rafael Bihari, FNP  11:09 AM

## 2021-07-16 ENCOUNTER — Encounter (HOSPITAL_COMMUNITY): Payer: Self-pay

## 2021-07-16 ENCOUNTER — Ambulatory Visit (HOSPITAL_COMMUNITY)
Admission: RE | Admit: 2021-07-16 | Discharge: 2021-07-16 | Disposition: A | Discharge status: Home / Self Care | Payer: Medicare PPO | Source: Ambulatory Visit | Attending: Internal Medicine | Admitting: Internal Medicine

## 2021-07-16 VITALS — BP 102/70 | HR 72 | Wt 201.4 lb

## 2021-07-16 DIAGNOSIS — Z7901 Long term (current) use of anticoagulants: Secondary | ICD-10-CM | POA: Diagnosis not present

## 2021-07-16 DIAGNOSIS — G4733 Obstructive sleep apnea (adult) (pediatric): Secondary | ICD-10-CM

## 2021-07-16 DIAGNOSIS — Z4502 Encounter for adjustment and management of automatic implantable cardiac defibrillator: Secondary | ICD-10-CM | POA: Diagnosis not present

## 2021-07-16 DIAGNOSIS — E669 Obesity, unspecified: Secondary | ICD-10-CM | POA: Diagnosis not present

## 2021-07-16 DIAGNOSIS — I4891 Unspecified atrial fibrillation: Secondary | ICD-10-CM

## 2021-07-16 DIAGNOSIS — I480 Paroxysmal atrial fibrillation: Secondary | ICD-10-CM | POA: Insufficient documentation

## 2021-07-16 DIAGNOSIS — Z95810 Presence of automatic (implantable) cardiac defibrillator: Secondary | ICD-10-CM

## 2021-07-16 DIAGNOSIS — Z853 Personal history of malignant neoplasm of breast: Secondary | ICD-10-CM | POA: Diagnosis not present

## 2021-07-16 DIAGNOSIS — Z6838 Body mass index (BMI) 38.0-38.9, adult: Secondary | ICD-10-CM | POA: Insufficient documentation

## 2021-07-16 DIAGNOSIS — N1832 Chronic kidney disease, stage 3b: Secondary | ICD-10-CM

## 2021-07-16 DIAGNOSIS — I130 Hypertensive heart and chronic kidney disease with heart failure and stage 1 through stage 4 chronic kidney disease, or unspecified chronic kidney disease: Secondary | ICD-10-CM | POA: Diagnosis not present

## 2021-07-16 DIAGNOSIS — Z79899 Other long term (current) drug therapy: Secondary | ICD-10-CM | POA: Diagnosis not present

## 2021-07-16 DIAGNOSIS — I5022 Chronic systolic (congestive) heart failure: Secondary | ICD-10-CM | POA: Diagnosis not present

## 2021-07-16 DIAGNOSIS — I10 Essential (primary) hypertension: Secondary | ICD-10-CM | POA: Diagnosis not present

## 2021-07-16 DIAGNOSIS — Z7984 Long term (current) use of oral hypoglycemic drugs: Secondary | ICD-10-CM | POA: Insufficient documentation

## 2021-07-16 DIAGNOSIS — R682 Dry mouth, unspecified: Secondary | ICD-10-CM | POA: Insufficient documentation

## 2021-07-16 LAB — BASIC METABOLIC PANEL
Anion gap: 9 (ref 5–15)
BUN: 23 mg/dL (ref 8–23)
CO2: 31 mmol/L (ref 22–32)
Calcium: 9.2 mg/dL (ref 8.9–10.3)
Chloride: 100 mmol/L (ref 98–111)
Creatinine, Ser: 1.64 mg/dL — ABNORMAL HIGH (ref 0.44–1.00)
GFR, Estimated: 34 mL/min — ABNORMAL LOW (ref >60)
Glucose, Bld: 114 mg/dL — ABNORMAL HIGH (ref 70–99)
Potassium: 3.7 mmol/L (ref 3.5–5.1)
Sodium: 140 mmol/L (ref 135–145)

## 2021-07-16 LAB — BRAIN NATRIURETIC PEPTIDE: B Natriuretic Peptide: 523.7 pg/mL — ABNORMAL HIGH (ref 0.0–100.0)

## 2021-07-16 MED ORDER — BISOPROLOL FUMARATE 10 MG PO TABS: 10.0000 mg | ORAL_TABLET | Freq: Every day | ORAL | 3 refills | Status: DC

## 2021-07-16 MED ORDER — TORSEMIDE 20 MG PO TABS: 40.0000 mg | ORAL_TABLET | Freq: Two times a day (BID) | ORAL | 6 refills | Status: DC

## 2021-07-16 MED ORDER — BISOPROLOL FUMARATE 5 MG PO TABS: 5.0000 mg | ORAL_TABLET | Freq: Every day | ORAL | 6 refills | Status: AC

## 2021-07-16 NOTE — Patient Instructions (Addendum)
INCREASE Torsemide to 40 mg twice a day DECREASE  Bisoprolol to 5 mg, one tab daily  Labs today We will only contact you if something comes back abnormal or we need to make some changes. Otherwise no news is good news!  Labs needed in 7-10 days  Your physician recommends that you schedule a follow-up appointment in: 4 weeks  in the Advanced Practitioners (PA/NP) Clinic and in 3 months with Dr Haroldine Laws and echo  Your physician has requested that you have an echocardiogram. Echocardiography is a painless test that uses sound waves to create images of your heart. It provides your doctor with information about the size and shape of your heart and how well your heart's chambers and valves are working. This procedure takes approximately one hour. There are no restrictions for this procedure.  Do the following things EVERYDAY: Weigh yourself in the morning before breakfast. Write it down and keep it in a log. Take your medicines as prescribed Eat low salt foods--Limit salt (sodium) to 2000 mg per day.  Stay as active as you can everyday Limit all fluids for the day to less than 2 liters  At the [facility], you and your health needs are our priority. As part of our continuing mission to provide you with exceptional heart care, we have created designated Provider Care Teams. These Care Teams include your primary Cardiologist (physician) and Advanced Practice Providers (APPs- Physician Assistants and Nurse Practitioners) who all work together to provide you with the care you need, when you need it.   You may see any of the following providers on your designated Care Team at your next follow up: Dr Glori Bickers Dr Haynes Kerns, NP Lyda Jester, [facility] [place], [place] Audry Riles, PharmD   Please be sure to bring in all your medications bottles to every appointment.   If you have any questions or concerns before your next appointment  please send [place] a message through [place] or call our office at [phone].    TO LEAVE A MESSAGE FOR THE NURSE SELECT OPTION 2, PLEASE LEAVE A MESSAGE INCLUDING: YOUR NAME DATE OF BIRTH CALL BACK NUMBER REASON FOR CALL**this is important as we prioritize the call backs  YOU WILL RECEIVE A CALL BACK THE SAME DAY AS LONG AS YOU CALL BEFORE 4:00 PM

## 2021-07-23 ENCOUNTER — Other Ambulatory Visit (HOSPITAL_COMMUNITY): Payer: Self-pay | Admitting: Internal Medicine

## 2021-07-24 ENCOUNTER — Other Ambulatory Visit: Payer: Self-pay

## 2021-07-24 DIAGNOSIS — I4891 Unspecified atrial fibrillation: Secondary | ICD-10-CM

## 2021-07-25 ENCOUNTER — Ambulatory Visit (HOSPITAL_COMMUNITY): Payer: Medicare PPO | Admitting: Physician Assistant

## 2021-07-28 ENCOUNTER — Ambulatory Visit (INDEPENDENT_AMBULATORY_CARE_PROVIDER_SITE_OTHER): Payer: Medicare PPO

## 2021-07-28 ENCOUNTER — Ambulatory Visit (HOSPITAL_COMMUNITY): Payer: Medicare PPO | Admitting: Physician Assistant

## 2021-07-28 DIAGNOSIS — I5022 Chronic systolic (congestive) heart failure: Secondary | ICD-10-CM | POA: Diagnosis not present

## 2021-07-28 DIAGNOSIS — Z95810 Presence of automatic (implantable) cardiac defibrillator: Secondary | ICD-10-CM

## 2021-07-28 NOTE — Progress Notes (Signed)
EPIC Encounter for ICM Monitoring  Patient Name: [name] is a 65 y.o. female Date: 07/28/2021 Primary Care Physican: Wayna Chalet, NP Primary Cardiologist: [place] Electrophysiologist: Vergie Living Pacing: 100%          Time in AT/AF  0.0 hr/day (0.0%)                                                            ICM check for HF clinic after 07/16/2021 OV.  Transmission reviewed.    Optivol thoracic impedance suggesting normal fluid levels since 07/20/2021.   Prescribed: Torsemide 20 mg take 40 mg twice a day. Metolazone 2.5 mg Take 2.5 mg by mouth on Monday and Thursdays Potassium 20 mEq take 1 tablet daily with additional 40 mEq with every Metolazone dose Spironolactone 25 mg take 1 tablet daily.   Recommendations:  Copy sent to Allena Katz, NP as requested.     Follow-up plan: No further ICM follow up planned at this time.   91 day device clinic remote transmission 10/02/2021.     EP/Cardiology Office Visits: 07/29/2021 with Dr Quentin Ore.    Copy of ICM check sent to Dr. Caryl Comes.   3 month ICM trend: 07/28/2021.    12-14 Month ICM trend:       Rosalene Billings, RN 07/28/2021 2:08 PM

## 2021-07-29 ENCOUNTER — Other Ambulatory Visit: Payer: Self-pay

## 2021-07-29 ENCOUNTER — Encounter: Payer: Self-pay | Admitting: Cardiology

## 2021-07-29 ENCOUNTER — Ambulatory Visit: Payer: Medicare PPO | Admitting: Cardiology

## 2021-07-29 VITALS — BP 140/74 | HR 83 | Ht 61.0 in | Wt 197.6 lb

## 2021-07-29 DIAGNOSIS — I480 Paroxysmal atrial fibrillation: Secondary | ICD-10-CM

## 2021-07-29 DIAGNOSIS — I5022 Chronic systolic (congestive) heart failure: Secondary | ICD-10-CM | POA: Diagnosis not present

## 2021-07-29 DIAGNOSIS — Z95810 Presence of automatic (implantable) cardiac defibrillator: Secondary | ICD-10-CM | POA: Diagnosis not present

## 2021-07-29 DIAGNOSIS — G4733 Obstructive sleep apnea (adult) (pediatric): Secondary | ICD-10-CM | POA: Diagnosis not present

## 2021-07-29 LAB — CBC WITH DIFFERENTIAL/PLATELET
Basophils Absolute: 0.1 10*3/uL (ref 0.0–0.2)
Basos: 1 %
EOS (ABSOLUTE): 0.3 10*3/uL (ref 0.0–0.4)
Eos: 4 %
Hematocrit: 37.1 % (ref 34.0–46.6)
Hemoglobin: 11.8 g/dL (ref 11.1–15.9)
Lymphocytes Absolute: 1.1 10*3/uL (ref 0.7–3.1)
Lymphs: 14 %
MCH: 31.1 pg (ref 26.6–33.0)
MCHC: 31.8 g/dL (ref 31.5–35.7)
MCV: 98 fL — ABNORMAL HIGH (ref 79–97)
Monocytes Absolute: 0.8 10*3/uL (ref 0.1–0.9)
Monocytes: 10 %
Neutrophils Absolute: 5.7 10*3/uL (ref 1.4–7.0)
Neutrophils: 71 %
Platelets: 334 10*3/uL (ref 150–450)
RBC: 3.8 x10E6/uL (ref 3.77–5.28)
RDW: 17.2 % — ABNORMAL HIGH (ref 11.7–15.4)
WBC: 7.9 10*3/uL (ref 3.4–10.8)

## 2021-07-29 LAB — BASIC METABOLIC PANEL
BUN/Creatinine Ratio: 19 (ref 12–28)
BUN: 43 mg/dL — ABNORMAL HIGH (ref 8–27)
CO2: 32 mmol/L — ABNORMAL HIGH (ref 20–29)
Calcium: 10.3 mg/dL (ref 8.7–10.3)
Chloride: 92 mmol/L — ABNORMAL LOW (ref 96–106)
Creatinine, Ser: 2.32 mg/dL — ABNORMAL HIGH (ref 0.57–1.00)
Glucose: 147 mg/dL — ABNORMAL HIGH (ref 70–99)
Potassium: 3.1 mmol/L — ABNORMAL LOW (ref 3.5–5.2)
Sodium: 137 mmol/L (ref 134–144)
eGFR: 22 mL/min/{1.73_m2} — ABNORMAL LOW (ref >59)

## 2021-07-29 NOTE — Patient Instructions (Addendum)
Medication Instructions:  Your physician recommends that you continue on your current medications as directed. Please refer to the Current Medication list given to you today.  Labwork: You will get lab work today:  BMP and CBC  LEFT ATRIAL APPENDAGE CLOSURE INSTRUCTIONS: You are scheduled for a Left Atrial Appendage Device Implantation on Thursday, December 15 with Dr. Quentin Ore.  1. Please arrive at the [facility] (Main Entrance A) at [facility]: [place],  [id number] at 8:30 AM (This time is 2.5 hours before your procedure to ensure your preparation). Free valet parking service is available. You are allowed ONE visitor in the waiting room during your procedure. Both you and your guest must wear masks. Special note: Every effort is made to have your procedure done on time. Please understand that emergencies sometimes delay scheduled procedures.  2.   Do not eat or drink after midnight prior to your procedure.     3.   Medication Instructions:  On the morning of your procedure, TAKE ONLY your bisoprolol with a sip of water.  4. The day of surgery, please wear clean and comfortable clothes that are easy to get on and off and wear slip-on shoes. Do not wear lotions, powders, perfumes/colognes, or deodorant. Brush your teeth with your regular toothpaste. Do not wear jewelry, make up, or nail polish (including SNS, gel, acrylic, etc.). Do not bring valuables to the hospital - [facility] is not responsible for any belongings or valuables.  5. Plan for one night stay. When you are discharged, you will need someone to drive you home and stay with you for 24 hours.  6. Bring a current list of your medications and current insurance cards.  7. Tildon Husky Nurse, will arrange your post-procedure visits during your hospital stay. You will be discharged with dates and times of your follow-up appointments and 45-day TEE (TEE instructions will be reviewed at the follow-up  visit). [place]'s direct number is [phone] if you need assistance.  **If you have any questions, do not hesitate to call! You will also be contacted the week of your procedure to confirm instructions.**

## 2021-07-29 NOTE — H&P (View-Only) (Signed)
Electrophysiology Office Follow up Visit Note:    Date:  07/29/2021   ID:  [name], DOB 11-17-1955, MRN [id number]  PCP:  Wayna Chalet, NP  [facility] Cardiologist:  None  [facility] Electrophysiologist:  Virl Axe, MD    Interval History:    [name] is a 65 y.o. female who presents for a follow up visit.  She actually presented to the lab on June 30, 2021 for an A. fib ablation but there was a pericardial effusion localized to the right atrium that tracked underneath the right inferior pulmonary vein.  Given the location of the effusion next to the pulmonary veins and the necessary ablation path, I elected to abort the case.  She is scheduled for left atrial appendage occlusion on August 21, 2021.  She has been doing well since the procedure.     Past Medical History:  Diagnosis Date   AICD (automatic cardioverter/defibrillator) present    Biventricular implantable cardiac defibrillator)    Medtronic   Breast cancer ([place]) 1998   - left   CHF (congestive heart failure) (HCC)    takes Lasix and Aldactone daily   Chronic kidney disease    Complication of anesthesia    n/v, none at last SURGERY-2022.   GERD (gastroesophageal reflux disease)    takes Omeprazole daily   Gout    takes Allopurinol daily   History of bronchitis 2 yrs ago   History of shingles    Hyperlipidemia    takes Simvastatin daily   Insomnia    takes Ambien nightly   Nonischemic cardiomyopathy ([place])    EF 55% echo 2012   NSVT (nonsustained ventricular tachycardia)    Obesity    OSA (obstructive sleep apnea)    Unable to wear CPAP   Pneumonia 2005   Presence of permanent cardiac pacemaker    Shortness of breath dyspnea    with exertion   sprint Fidelis     Past Surgical History:  Procedure Laterality Date   ABDOMINAL HYSTERECTOMY     APPENDECTOMY  09/07/1976   ATRIAL FIBRILLATION ABLATION N/A 06/30/2021   Procedure: ATRIAL FIBRILLATION ABLATION;   Surgeon: Vickie Epley, MD;  Location: [place] CV LAB;  Service: Cardiovascular;  Laterality: N/A;   BiV ICD  09/07/2005   Medtronic   BIV ICD GENERTAOR CHANGE OUT N/A 12/16/2011   Procedure: BIV ICD GENERTAOR CHANGE OUT;  Surgeon: [name], MD;  Location: [facility] CATH LAB;  Service: Cardiovascular;  Laterality: N/A;   CHOLECYSTECTOMY  09/07/2017   COLONOSCOPY     cyst removed from wrist Left    early 70's   ICD LEAD REMOVAL Right 10/18/2014   Procedure: ICD LEAD REMOVAL/EXTRACTION/REIMPLANTATION;  Surgeon: Evans Lance, MD;  Location: [place];  Service: Cardiovascular;  Laterality: Right;   KYPHOPLASTY N/A 06/06/2021   Procedure: KYPHOPLASTY LUMBAR ONE;  Surgeon: Karsten Ro, DO;  Location: [place];  Service: Neurosurgery;  Laterality: N/A;   MASTECTOMY Left 09/07/1996   port a cath placed  09/07/1996   port removed  09/07/1996   RIGHT HEART CATH N/A 06/20/2018   Procedure: RIGHT HEART CATH;  Surgeon: Jolaine Artist, MD;  Location: [place] CV LAB;  Service: Cardiovascular;  Laterality: N/A;    Current Medications: Current Meds  Medication Sig   alendronate (FOSAMAX) 70 MG tablet Take 70 mg by mouth every Monday.   allopurinol (ZYLOPRIM) 100 MG tablet Take 100 mg by mouth daily.    amiodarone (  PACERONE) 200 MG tablet Take 200 mg by mouth daily.   atorvastatin (LIPITOR) 10 MG tablet Take 10 mg by mouth at bedtime.   bisoprolol (ZEBETA) 5 MG tablet Take 1 tablet (5 mg total) by mouth daily.   buPROPion (WELLBUTRIN SR) 150 MG 12 hr tablet Take 150 mg by mouth 2 (two) times daily.   Cholecalciferol (VITAMIN D3) 5000 units TABS Take 5,000 Units by mouth daily.   empagliflozin (JARDIANCE) 10 MG TABS tablet Take 1 tablet (10 mg total) by mouth daily before breakfast.   metolazone (ZAROXOLYN) 2.5 MG tablet TAKE 1 TABLET BY MOUTH ONCE A WEEK ON THURSDAYS   Multiple Vitamins-Minerals (ONE DAILY FOR WOMEN 50+ ADV PO) Take 1 tablet by mouth daily.   omeprazole (PRILOSEC) 20 MG  capsule Take 20 mg by mouth daily.   potassium chloride SA (KLOR-CON M20) 20 MEQ tablet Take 2 tablets (40 mEq total) by mouth 2 (two) times daily.   Rivaroxaban (XARELTO) 15 MG TABS tablet Take 1 tablet (15 mg total) by mouth daily.   torsemide (DEMADEX) 20 MG tablet TAKE 1 TABLET EVERY DAY   venlafaxine XR (EFFEXOR-XR) 150 MG 24 hr capsule Take 150 mg by mouth daily with breakfast.   vitamin B-12 (CYANOCOBALAMIN) 100 MCG tablet Take 100 mcg by mouth daily.   zolpidem (AMBIEN) 10 MG tablet Take 10 mg by mouth at bedtime.     Allergies:   Buprenorphine hcl, Morphine and related, Percocet [oxycodone-acetaminophen], and Oxycodone   Social History   Socioeconomic History   Marital status: Married    Spouse name: Not on file   Number of children: Not on file   Years of education: Not on file   Highest education level: Not on file  Occupational History   Not on file  Tobacco Use   Smoking status: Never   Smokeless tobacco: Never  Vaping Use   Vaping Use: Never used  Substance and Sexual Activity   Alcohol use: No   Drug use: No   Sexual activity: Yes    Birth control/protection: None  Other Topics Concern   Not on file  Social History Narrative   Not on file   Social Determinants of Health   Financial Resource Strain: Not on file  Food Insecurity: Not on file  Transportation Needs: Not on file  Physical Activity: Not on file  Stress: Not on file  Social Connections: Not on file     Family History: The patient's family history includes Alzheimer's disease in her father; Diabetes in her mother; Heart disease in her father.  ROS:   Please see the history of present illness.    All other systems reviewed and are negative.  EKGs/Labs/Other Studies Reviewed:    The following studies were reviewed today:   EKG:  The ekg ordered today demonstrates biventricular pacing and what appears to be a low amplitude P wave.  Also possible is an atypical atrial flutter with low  amplitude flutter waves.  Recent Labs: 02/10/2021: Magnesium 2.3 04/03/2021: ALT 15; TSH 5.600 07/16/2021: B Natriuretic Peptide 523.7 07/29/2021: BUN 43; Creatinine, Ser 2.32; Hemoglobin 11.8; Platelets 334; Potassium 3.1; Sodium 137  Recent Lipid Panel    Component Value Date/Time   CHOL 133 08/01/2014 1057   TRIG 94 08/01/2014 1057   HDL 43 08/01/2014 1057   CHOLHDL 3.1 08/01/2014 1057   VLDL 19 08/01/2014 1057   LDLCALC 71 08/01/2014 1057   LDLDIRECT 147.2 01/15/2009 0941    Physical Exam:    VS:  BP 140/74    Pulse 83    Ht 5\' 1"  (1.549 m)    Wt 197 lb 9.6 oz (89.6 kg)    SpO2 95%    BMI 37.34 kg/m     Wt Readings from Last 3 Encounters:  07/29/21 197 lb 9.6 oz (89.6 kg)  07/16/21 201 lb 6.4 oz (91.4 kg)  06/30/21 198 lb (89.8 kg)     GEN: Well nourished, well developed in no acute distress HEENT: Normal NECK: No JVD; No carotid bruits LYMPHATICS: No lymphadenopathy CARDIAC: RRR, no murmurs, rubs, gallops RESPIRATORY:  Clear to auscultation without rales, wheezing or rhonchi  ABDOMEN: Soft, non-tender, non-distended MUSCULOSKELETAL:  No edema; No deformity  SKIN: Warm and dry NEUROLOGIC:  Alert and oriented x 3 PSYCHIATRIC:  Normal affect        ASSESSMENT:    1. Paroxysmal atrial fibrillation (HCC)   2. Chronic systolic heart failure ([place])   3. OSA (obstructive sleep apnea)   4. Biventricular implantable cardioverter-defibrillator-medtronic    PLAN:    In order of problems listed above:  #Paroxysmal atrial fibrillation Overall fairly low burden.  Currently taking amiodarone 200 mg by mouth once daily.  Takes Xarelto for stroke prophylaxis.  We discussed events surrounding the first ablation attempt but the pericardial effusion localized to the lateral RA tracking down to the right.  Pulmonary vein.  I discussed available options including monitoring the A. fib burden proceeding immediately for repeat ablation attempt or treating with antiarrhythmic drugs.   For now we will plan to monitor the A. fib burden and proceed with watchman implant.  We discussed the need to wait at least 6 months or so after watchman implant before proceeding with ablation if that is ultimately pursued.  I reviewed the watchman procedure in detail including risks and she wishes to proceed.  ----------  I have seen [name] in the office today who is being considered for a Watchman left atrial appendage closure device. I believe they will benefit from this procedure given their history of atrial fibrillation, CHA2DS2-VASc score of at least 3 and unadjusted ischemic stroke rate of 4.6% per year. Unfortunately, the patient is not felt to be a long term anticoagulation candidate secondary to a history of falls and severe bruising. The patient's chart has been reviewed and I feel that they would be a candidate for short term oral anticoagulation after Watchman implant.    It is my belief that after undergoing a LAA closure procedure, [name] will not need long term anticoagulation which eliminates anticoagulation side effects and major bleeding risk.    Procedural risks for the Watchman implant have been reviewed with the patient including a 0.5% risk of stroke, <1% risk of perforation and <1% risk of device embolization.      The published clinical data on the safety and effectiveness of WATCHMAN include but are not limited to the following: - Holmes DR, Mechele Claude, Sick P et al. for the PROTECT AF Investigators. Percutaneous closure of the left atrial appendage versus warfarin therapy for prevention of stroke in patients with atrial fibrillation: a randomised non-inferiority trial. Lancet 2009; 374: 534-42. Mechele Claude, Doshi SK, Abelardo Diesel D et al. on behalf of the PROTECT AF Investigators. Percutaneous Left Atrial Appendage Closure for Stroke Prophylaxis in Patients With Atrial Fibrillation 2.3-Year Follow-up of the PROTECT AF (Watchman Left Atrial  Appendage System for Embolic Protection in Patients With Atrial Fibrillation) Trial. Circulation 2013; 127:720-729. - Alli O,  Adella Nissen S, Reddy VY, Sievert H et al. Quality of Life Assessment in the Randomized PROTECT AF (Percutaneous Closure of the Left Atrial Appendage Versus Warfarin Therapy for Prevention of Stroke in Patients With Atrial Fibrillation) Trial of Patients at Risk for Stroke With Nonvalvular Atrial Fibrillation. J Am Coll Cardiol 2013; 73:5789-7. Vertell Limber DR, Tarri Abernethy, Price M, Knox, Sievert H, Doshi S, Huber K, Reddy V. Prospective randomized evaluation of the Watchman left atrial appendage Device in patients with atrial fibrillation versus long-term warfarin therapy; the PREVAIL trial. Journal of the [employer] of Cardiology, Vol. 4, No. 1, 2014, 1-11. - Kar S, Doshi SK, Sadhu A, Horton R, Osorio J et al. Primary outcome evaluation of a next-generation left atrial appendage closure device: results from the PINNACLE FLX trial. Circulation 2021;143(18)1754-1762.    After today's visit with the patient which was dedicated solely for shared decision making visit regarding LAA closure device, the patient decided to proceed with the LAA appendage closure procedure scheduled to be done in the near future at [facility].   She will continue to take her Xarelto for stroke prophylaxis.  She will continue this uninterrupted for now until at least 45 days after the watchman implant.        Total time spent with patient today 45 minutes. This includes reviewing records, evaluating the patient and coordinating care.   Medication Adjustments/Labs and Tests Ordered: Current medicines are reviewed at length with the patient today.  Concerns regarding medicines are outlined above.  Orders Placed This Encounter  Procedures   Basic Metabolic Panel (BMET)   CBC w/Diff   EKG 12-Lead   No orders of the defined types were placed in this encounter.    Signed, Lars Mage, MD, [facility], [facility] 07/29/2021 7:26 PM    Electrophysiology [facility]

## 2021-07-29 NOTE — Progress Notes (Signed)
Electrophysiology Office Follow up Visit Note:    Date:  07/29/2021   ID:  [name], DOB [birth year], MRN [id number]  PCP:  Wayna Chalet, NP  [facility] Cardiologist:  None  [facility] Electrophysiologist:  Virl Axe, MD    Interval History:    [name] is a 65 y.o. female who presents for a follow up visit.  She actually presented to the lab on June 30, 2021 for an A. fib ablation but there was a pericardial effusion localized to the right atrium that tracked underneath the right inferior pulmonary vein.  Given the location of the effusion next to the pulmonary veins and the necessary ablation path, I elected to abort the case.  She is scheduled for left atrial appendage occlusion on August 21, 2021.  She has been doing well since the procedure.     Past Medical History:  Diagnosis Date   AICD (automatic cardioverter/defibrillator) present    Biventricular implantable cardiac defibrillator)    Medtronic   Breast cancer ([place]) 1998   - left   CHF (congestive heart failure) (HCC)    takes Lasix and Aldactone daily   Chronic kidney disease    Complication of anesthesia    n/v, none at last SURGERY-2022.   GERD (gastroesophageal reflux disease)    takes Omeprazole daily   Gout    takes Allopurinol daily   History of bronchitis 2 yrs ago   History of shingles    Hyperlipidemia    takes Simvastatin daily   Insomnia    takes Ambien nightly   Nonischemic cardiomyopathy ([place])    EF 55% echo 2012   NSVT (nonsustained ventricular tachycardia)    Obesity    OSA (obstructive sleep apnea)    Unable to wear CPAP   Pneumonia 2005   Presence of permanent cardiac pacemaker    Shortness of breath dyspnea    with exertion   sprint Fidelis     Past Surgical History:  Procedure Laterality Date   ABDOMINAL HYSTERECTOMY     APPENDECTOMY  09/07/1976   ATRIAL FIBRILLATION ABLATION N/A 06/30/2021   Procedure: ATRIAL FIBRILLATION ABLATION;   Surgeon: Vickie Epley, MD;  Location: [place] CV LAB;  Service: Cardiovascular;  Laterality: N/A;   BiV ICD  09/07/2005   Medtronic   BIV ICD GENERTAOR CHANGE OUT N/A 12/16/2011   Procedure: BIV ICD GENERTAOR CHANGE OUT;  Surgeon: [name], MD;  Location: [facility] CATH LAB;  Service: Cardiovascular;  Laterality: N/A;   CHOLECYSTECTOMY  09/07/2017   COLONOSCOPY     cyst removed from wrist Left    early 70's   ICD LEAD REMOVAL Right 10/18/2014   Procedure: ICD LEAD REMOVAL/EXTRACTION/REIMPLANTATION;  Surgeon: Evans Lance, MD;  Location: [place];  Service: Cardiovascular;  Laterality: Right;   KYPHOPLASTY N/A 06/06/2021   Procedure: KYPHOPLASTY LUMBAR ONE;  Surgeon: Karsten Ro, DO;  Location: [place];  Service: Neurosurgery;  Laterality: N/A;   MASTECTOMY Left 09/07/1996   port a cath placed  09/07/1996   port removed  09/07/1996   RIGHT HEART CATH N/A 06/20/2018   Procedure: RIGHT HEART CATH;  Surgeon: Jolaine Artist, MD;  Location: [place] CV LAB;  Service: Cardiovascular;  Laterality: N/A;    Current Medications: Current Meds  Medication Sig   alendronate (FOSAMAX) 70 MG tablet Take 70 mg by mouth every Monday.   allopurinol (ZYLOPRIM) 100 MG tablet Take 100 mg by mouth daily.    amiodarone (  PACERONE) 200 MG tablet Take 200 mg by mouth daily.   atorvastatin (LIPITOR) 10 MG tablet Take 10 mg by mouth at bedtime.   bisoprolol (ZEBETA) 5 MG tablet Take 1 tablet (5 mg total) by mouth daily.   buPROPion (WELLBUTRIN SR) 150 MG 12 hr tablet Take 150 mg by mouth 2 (two) times daily.   Cholecalciferol (VITAMIN D3) 5000 units TABS Take 5,000 Units by mouth daily.   empagliflozin (JARDIANCE) 10 MG TABS tablet Take 1 tablet (10 mg total) by mouth daily before breakfast.   metolazone (ZAROXOLYN) 2.5 MG tablet TAKE 1 TABLET BY MOUTH ONCE A WEEK ON THURSDAYS   Multiple Vitamins-Minerals (ONE DAILY FOR WOMEN 50+ ADV PO) Take 1 tablet by mouth daily.   omeprazole (PRILOSEC) 20 MG  capsule Take 20 mg by mouth daily.   potassium chloride SA (KLOR-CON M20) 20 MEQ tablet Take 2 tablets (40 mEq total) by mouth 2 (two) times daily.   Rivaroxaban (XARELTO) 15 MG TABS tablet Take 1 tablet (15 mg total) by mouth daily.   torsemide (DEMADEX) 20 MG tablet TAKE 1 TABLET EVERY DAY   venlafaxine XR (EFFEXOR-XR) 150 MG 24 hr capsule Take 150 mg by mouth daily with breakfast.   vitamin B-12 (CYANOCOBALAMIN) 100 MCG tablet Take 100 mcg by mouth daily.   zolpidem (AMBIEN) 10 MG tablet Take 10 mg by mouth at bedtime.     Allergies:   Buprenorphine hcl, Morphine and related, Percocet [oxycodone-acetaminophen], and Oxycodone   Social History   Socioeconomic History   Marital status: Married    Spouse name: Not on file   Number of children: Not on file   Years of education: Not on file   Highest education level: Not on file  Occupational History   Not on file  Tobacco Use   Smoking status: Never   Smokeless tobacco: Never  Vaping Use   Vaping Use: Never used  Substance and Sexual Activity   Alcohol use: No   Drug use: No   Sexual activity: Yes    Birth control/protection: None  Other Topics Concern   Not on file  Social History Narrative   Not on file   Social Determinants of Health   Financial Resource Strain: Not on file  Food Insecurity: Not on file  Transportation Needs: Not on file  Physical Activity: Not on file  Stress: Not on file  Social Connections: Not on file     Family History: The patient's family history includes Alzheimer's disease in her father; Diabetes in her mother; Heart disease in her father.  ROS:   Please see the history of present illness.    All other systems reviewed and are negative.  EKGs/Labs/Other Studies Reviewed:    The following studies were reviewed today:   EKG:  The ekg ordered today demonstrates biventricular pacing and what appears to be a low amplitude P wave.  Also possible is an atypical atrial flutter with low  amplitude flutter waves.  Recent Labs: 02/10/2021: Magnesium 2.3 04/03/2021: ALT 15; TSH 5.600 07/16/2021: B Natriuretic Peptide 523.7 07/29/2021: BUN 43; Creatinine, Ser 2.32; Hemoglobin 11.8; Platelets 334; Potassium 3.1; Sodium 137  Recent Lipid Panel    Component Value Date/Time   CHOL 133 08/01/2014 1057   TRIG 94 08/01/2014 1057   HDL 43 08/01/2014 1057   CHOLHDL 3.1 08/01/2014 1057   VLDL 19 08/01/2014 1057   LDLCALC 71 08/01/2014 1057   LDLDIRECT 147.2 01/15/2009 0941    Physical Exam:    VS:  BP 140/74   Pulse 83   Ht 5\' 1"  (1.549 m)   Wt 197 lb 9.6 oz (89.6 kg)   SpO2 95%   BMI 37.34 kg/m     Wt Readings from Last 3 Encounters:  07/29/21 197 lb 9.6 oz (89.6 kg)  07/16/21 201 lb 6.4 oz (91.4 kg)  06/30/21 198 lb (89.8 kg)     GEN: Well nourished, well developed in no acute distress HEENT: Normal NECK: No JVD; No carotid bruits LYMPHATICS: No lymphadenopathy CARDIAC: RRR, no murmurs, rubs, gallops RESPIRATORY:  Clear to auscultation without rales, wheezing or rhonchi  ABDOMEN: Soft, non-tender, non-distended MUSCULOSKELETAL:  No edema; No deformity  SKIN: Warm and dry NEUROLOGIC:  Alert and oriented x 3 PSYCHIATRIC:  Normal affect        ASSESSMENT:    1. Paroxysmal atrial fibrillation (HCC)   2. Chronic systolic heart failure ([place])   3. OSA (obstructive sleep apnea)   4. Biventricular implantable cardioverter-defibrillator-medtronic    PLAN:    In order of problems listed above:  #Paroxysmal atrial fibrillation Overall fairly low burden.  Currently taking amiodarone 200 mg by mouth once daily.  Takes Xarelto for stroke prophylaxis.  We discussed events surrounding the first ablation attempt but the pericardial effusion localized to the lateral RA tracking down to the right.  Pulmonary vein.  I discussed available options including monitoring the A. fib burden proceeding immediately for repeat ablation attempt or treating with antiarrhythmic drugs.   For now we will plan to monitor the A. fib burden and proceed with watchman implant.  We discussed the need to wait at least 6 months or so after watchman implant before proceeding with ablation if that is ultimately pursued.  I reviewed the watchman procedure in detail including risks and she wishes to proceed.  ----------  I have seen [name] in the office today who is being considered for a Watchman left atrial appendage closure device. I believe they will benefit from this procedure given their history of atrial fibrillation, CHA2DS2-VASc score of at least 3 and unadjusted ischemic stroke rate of 4.6% per year. Unfortunately, the patient is not felt to be a long term anticoagulation candidate secondary to a history of falls and severe bruising. The patient's chart has been reviewed and I feel that they would be a candidate for short term oral anticoagulation after Watchman implant.    It is my belief that after undergoing a LAA closure procedure, [name] will not need long term anticoagulation which eliminates anticoagulation side effects and major bleeding risk.    Procedural risks for the Watchman implant have been reviewed with the patient including a 0.5% risk of stroke, <1% risk of perforation and <1% risk of device embolization.      The published clinical data on the safety and effectiveness of WATCHMAN include but are not limited to the following: - Holmes DR, Mechele Claude, Sick P et al. for the PROTECT AF Investigators. Percutaneous closure of the left atrial appendage versus warfarin therapy for prevention of stroke in patients with atrial fibrillation: a randomised non-inferiority trial. Lancet 2009; 374: 534-42. Mechele Claude, Doshi SK, Abelardo Diesel D et al. on behalf of the PROTECT AF Investigators. Percutaneous Left Atrial Appendage Closure for Stroke Prophylaxis in Patients With Atrial Fibrillation 2.3-Year Follow-up of the PROTECT AF (Watchman Left Atrial  Appendage System for Embolic Protection in Patients With Atrial Fibrillation) Trial. Circulation 2013; 127:720-729. - Alli Azucena Kuba,  Kar S,  Mechele Claude, Sievert H et al. Quality of Life Assessment in the Randomized PROTECT AF (Percutaneous Closure of the Left Atrial Appendage Versus Warfarin Therapy for Prevention of Stroke in Patients With Atrial Fibrillation) Trial of Patients at Risk for Stroke With Nonvalvular Atrial Fibrillation. J Am Coll Cardiol 2013; 38:3338-3. Vertell Limber DR, Tarri Abernethy, Price M, [place], Sievert H, Doshi S, Huber K, Reddy V. Prospective randomized evaluation of the Watchman left atrial appendage Device in patients with atrial fibrillation versus long-term warfarin therapy; the PREVAIL trial. Journal of the [employer] of Cardiology, Vol. 4, No. 1, 2014, 1-11. - Kar S, Doshi SK, Sadhu A, Horton R, Osorio J et al. Primary outcome evaluation of a next-generation left atrial appendage closure device: results from the PINNACLE FLX trial. Circulation 2021;143(18)1754-1762.    After today's visit with the patient which was dedicated solely for shared decision making visit regarding LAA closure device, the patient decided to proceed with the LAA appendage closure procedure scheduled to be done in the near future at [facility].   She will continue to take her Xarelto for stroke prophylaxis.  She will continue this uninterrupted for now until at least 45 days after the watchman implant.        Total time spent with patient today 45 minutes. This includes reviewing records, evaluating the patient and coordinating care.   Medication Adjustments/Labs and Tests Ordered: Current medicines are reviewed at length with the patient today.  Concerns regarding medicines are outlined above.  Orders Placed This Encounter  Procedures   Basic Metabolic Panel (BMET)   CBC w/Diff   EKG 12-Lead   No orders of the defined types were placed in this encounter.    Signed, Lars Mage, MD, [facility], [facility] 07/29/2021 7:26 PM    Electrophysiology [facility]

## 2021-08-04 ENCOUNTER — Telehealth: Payer: Self-pay

## 2021-08-04 DIAGNOSIS — I428 Other cardiomyopathies: Secondary | ICD-10-CM

## 2021-08-04 MED ORDER — POTASSIUM CHLORIDE CRYS ER 20 MEQ PO TBCR
60.0000 meq | EXTENDED_RELEASE_TABLET | Freq: Two times a day (BID) | ORAL | 3 refills | Status: DC
Start: 2021-08-04 — End: 2021-08-13

## 2021-08-04 NOTE — Telephone Encounter (Signed)
-----   Message from Vickie Epley, MD sent at 08/03/2021  9:03 PM EST ----- Your labwork is abnormal and shows a slight worsening in the kidney function. Your potassium is also low.   Please increase your potassium to 60 mEq twice daily.   I would like to recheck these labs this week.  Sonia Baller, can you please order a repeat BMP and magnesium.   Thanks, Lysbeth Galas T. Quentin Ore, MD, [facility], [facility] Cardiac Electrophysiology

## 2021-08-05 NOTE — Telephone Encounter (Signed)
Faxed lab orders to [facility] practice lab as requested.

## 2021-08-06 ENCOUNTER — Ambulatory Visit (INDEPENDENT_AMBULATORY_CARE_PROVIDER_SITE_OTHER): Payer: Medicare PPO

## 2021-08-06 DIAGNOSIS — I428 Other cardiomyopathies: Secondary | ICD-10-CM

## 2021-08-06 LAB — CUP PACEART REMOTE DEVICE CHECK
Battery Remaining Longevity: 7 mo
Battery Voltage: 2.85 V
Brady Statistic AP VP Percent: 15.63 %
Brady Statistic AP VS Percent: 0.01 %
Brady Statistic AS VP Percent: 84.36 %
Brady Statistic AS VS Percent: 0 %
Brady Statistic RA Percent Paced: 15.64 %
Brady Statistic RV Percent Paced: 99.97 %
Date Time Interrogation Session: 20221130033424
HighPow Impedance: 75 Ohm
Implantable Lead Implant Date: 20070821
Implantable Lead Implant Date: 20070821
Implantable Lead Implant Date: 20160211
Implantable Lead Location: 753858
Implantable Lead Location: 753859
Implantable Lead Location: 753860
Implantable Lead Model: 4194
Implantable Lead Model: 5076
Implantable Lead Model: 6935
Implantable Pulse Generator Implant Date: 20160211
Lead Channel Impedance Value: 133 Ohm
Lead Channel Impedance Value: 399 Ohm
Lead Channel Impedance Value: 437 Ohm
Lead Channel Impedance Value: 627 Ohm
Lead Channel Impedance Value: 646 Ohm
Lead Channel Impedance Value: 703 Ohm
Lead Channel Pacing Threshold Amplitude: 0.5 V
Lead Channel Pacing Threshold Amplitude: 1.125 V
Lead Channel Pacing Threshold Amplitude: 1.875 V
Lead Channel Pacing Threshold Pulse Width: 0.4 ms
Lead Channel Pacing Threshold Pulse Width: 0.4 ms
Lead Channel Pacing Threshold Pulse Width: 0.4 ms
Lead Channel Sensing Intrinsic Amplitude: 1.125 mV
Lead Channel Sensing Intrinsic Amplitude: 1.125 mV
Lead Channel Sensing Intrinsic Amplitude: 22 mV
Lead Channel Sensing Intrinsic Amplitude: 22 mV
Lead Channel Setting Pacing Amplitude: 1.5 V
Lead Channel Setting Pacing Amplitude: 1.75 V
Lead Channel Setting Pacing Amplitude: 3.25 V
Lead Channel Setting Pacing Pulse Width: 0.4 ms
Lead Channel Setting Pacing Pulse Width: 0.4 ms
Lead Channel Setting Sensing Sensitivity: 0.3 mV

## 2021-08-12 NOTE — Progress Notes (Signed)
ADVANCED HF CLINIC NOTE  PCP: Clinton Quant at [facility] Cardiologist: Dr. Fransico Him HF cardiologist: Dr. Haroldine Laws  HPI: [name] is a 65 y.o. woman with history of breast CA in 1998 treated with Adriamycin x 4 cycles. In 2007, diagnosed with CHF with EF 25-35%. She is s/p Medtronic BiV-ICD in 2008 with Sprint Fidelis lead which was subsequently replaced. LV function has subsequently recovered  Had cath 12/10. Coronaries normal. Right atrial pressure mean of 7, RV pressure 28/6 . Pulmonary artery pressure was 23/9 with mean of 15.  Pulmonary capillary wedge pressure mean of 9. Fick cardiac output 3.9 L per minute.  Cardiac index 2.2 L per minute per meter squared.  Pulmonary vascular resistance was 1.5 Woods units.  [place] 2019  RA = 11 RV = 27/5 PA = 24/3 (17) PCW = 9 Fick cardiac output/index = 4.8/2.5 PVR = 1.8 WU Ao sat = 98% PA sat = 66%, 67%  Echo 2/21 EF 50%-55% mild to moderate TR. Personally reviewed  In 9/21 admitted to [place] with Covid (despite vaccination/boosting) was there for 6 days and got better. In 12/21 started having falls. SBP in 50s. Found to be septic with AKI.  BCx negative. Stopped all HF meds and diuretics. Gained 10 pounds.  Echo done EF 35-40%  Seen for HF follow 09/13/20. Volume overloaded by ICD interrogation. Torsemide was increased to 60 mg bid, losartan and carvedilol restarted.   Has had more AF recently on device and started on Eliquis by Dr. Caryl Comes. Had inappropriate ICD shocks for AF. Eliquis stopped and Xarelto started due to increased bruising.  Echo 12/12/20 EF 40-45% with dyssynchrony. RV ok   Admitted 10/22 for afib ablation but procedure aborted 2/2 pericardial fluid observed after transseptal puncture. Effusion stable on follow up echo, however EF down 25-30%. Plan for Watchman.  Today she returns for HF follow up. Overall feeling fine. SOB walking up hills. Some dizziness, fell yesterday after standing too quickly, did  not hit head. Last fall was in 6/22. Denies abnormal bleeding, palpitations, CP, edema, or PND/Orthopnea. Appetite ok. No fever or chills. Weight at home 188-189 pounds. Taking all medications. She is scheduled for Watchman on August 21, 2021.  Studies: Echo 10/22: 25-30%, severe LV dysfunction, RV ok, mod/severe TR. Echo 12/21: EF 35-40% (in setting of sepsis in [place]). Echo 2/21: EF 50%-55% mild to moderate TR Echo 9/19: EF 40-45% Echo 11/17: EF 50-55%  Echo 6/16: EF 45-50% Echo 11/15: EF 50% inferior HK GLS -17.8 Echo 8/14: EF 50% Echo 5/11: EF 50-55%  Echo 4/13 EF 50%. RV normal.  12/16/11 ICD generator replaced due to EOL  ROS: All systems negative except as listed in HPI, PMH and Problem List.  Past Medical History:  Diagnosis Date   AICD (automatic cardioverter/defibrillator) present    Biventricular implantable cardiac defibrillator)    Medtronic   Breast cancer ([name]) 1998   - left   CHF (congestive heart failure) ([place])    takes Lasix and Aldactone daily   Chronic kidney disease    Complication of anesthesia    n/v, none at last SURGERY-2022.   GERD (gastroesophageal reflux disease)    takes Omeprazole daily   Gout    takes Allopurinol daily   History of bronchitis 2 yrs ago   History of shingles    Hyperlipidemia    takes Simvastatin daily   Insomnia    takes Ambien  nightly   Nonischemic cardiomyopathy ([place])    EF 55% echo 2012   NSVT (nonsustained ventricular tachycardia)    Obesity    OSA (obstructive sleep apnea)    Unable to wear CPAP   Pneumonia 2005   Presence of permanent cardiac pacemaker    Shortness of breath dyspnea    with exertion   sprint Fidelis    Current Outpatient Medications  Medication Sig Dispense Refill   alendronate (FOSAMAX) 70 MG tablet Take 70 mg by mouth every Monday.     allopurinol (ZYLOPRIM) 100 MG tablet Take 100 mg by mouth daily.      amiodarone (PACERONE) 200 MG tablet Take 200 mg by mouth daily.     atorvastatin  (LIPITOR) 10 MG tablet Take 10 mg by mouth at bedtime.     bisoprolol (ZEBETA) 5 MG tablet Take 1 tablet (5 mg total) by mouth daily. 30 tablet 6   buPROPion (WELLBUTRIN SR) 150 MG 12 hr tablet Take 150 mg by mouth 2 (two) times daily.     Cholecalciferol (VITAMIN D3) 5000 units TABS Take 5,000 Units by mouth daily.     empagliflozin (JARDIANCE) 10 MG TABS tablet Take 1 tablet (10 mg total) by mouth daily before breakfast. 30 tablet 11   metolazone (ZAROXOLYN) 2.5 MG tablet Patient takes 1 tablet on Tuesdays and Thursdays.     Multiple Vitamins-Minerals (ONE DAILY FOR WOMEN 50+ ADV PO) Take 1 tablet by mouth daily.     omeprazole (PRILOSEC) 20 MG capsule Take 20 mg by mouth daily.     Potassium Chloride (KLOR-CON PO) Take 20 mEq by mouth. Patient takes 2 tablet in the morning and 2 tablets in the evening.     Rivaroxaban (XARELTO) 15 MG TABS tablet Take 1 tablet (15 mg total) by mouth daily. 30 tablet 5   torsemide (DEMADEX) 20 MG tablet TAKE 1 TABLET EVERY DAY 90 tablet 3   torsemide (DEMADEX) 20 MG tablet Patient takes 2 tablets in the morning and 2 tablets at lunch.     venlafaxine XR (EFFEXOR-XR) 150 MG 24 hr capsule Take 150 mg by mouth daily with breakfast.     vitamin B-12 (CYANOCOBALAMIN) 100 MCG tablet Take 100 mcg by mouth daily.     zolpidem (AMBIEN) 10 MG tablet Take 10 mg by mouth at bedtime.     No current facility-administered medications for this encounter.   BP 112/80   Pulse 81   Wt 88.5 kg (195 lb 3.2 oz)   SpO2 97%   BMI 36.88 kg/m   Wt Readings from Last 3 Encounters:  08/13/21 88.5 kg (195 lb 3.2 oz)  07/29/21 89.6 kg (197 lb 9.6 oz)  07/16/21 91.4 kg (201 lb 6.4 oz)   PHYSICAL EXAM: General:  NAD. No resp difficulty HEENT: Normal Neck: Supple. No JVD. Carotids 2+ bilat; no bruits. No lymphadenopathy or thryomegaly appreciated. Cor: PMI nondisplaced. Regular rate & rhythm. No rubs, gallops, 2/6 TR Lungs: Clear Abdomen: Obese, nontender, nondistended. No  hepatosplenomegaly. No bruits or masses. Good bowel sounds. Extremities: No cyanosis, clubbing, rash, edema Neuro: Alert & oriented x 3, cranial nerves grossly intact. Moves all 4 extremities w/o difficulty. Affect pleasant.  ICD interrogation: OptiVol well below threshold, thoracic impedence stable, a couple short burst of AF/AT recently, >99% v-pacing, No VT, 2-3 hrs daily activity (personally reviewed).  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 EF 50-55% on echo 11/17 - Echo 9/19 EF 45% with septal dyssynergy (despite  biv pacing). - Echo 2/21 EF 50-55% mild to moderate TR - RHC in 10/19 with relatively normal hemodynamics except for RA 10 (PCWP was 9)  - Echo 12/21 at [place] EF 35-40% in setting of sepsis. - Echo 12/12/20 EF 40-45% with dyssynchrony. RV ok  - Echo 10/22 EF 25-30%, severe LV dysfunction, RV ok, mod/severe TR. - NYHA II, volume looks good on exam and OptiVol. GDMT limited by elevated SCr. - Continue torsemide 60 mg bid and continue metolazone every Monday and Thursday (with extra KCl 40).  - Increase KCL to 60 mEq bid.  - Continue bisoprolol 5 mg daily. - Continue Jardiance 10 mg daily. - Off spiro & losartan with elevated SCr. Hold off on adding losartan with recent dizziness. - Pt brought labs from 08/12/21, SCr 2.16, BUN 29, K 3.3, Mg 2.8.  - Repeat BMET 1 week, Rx given for [facility]. - Newly reduced EF on recent echo. Discussed with Dr. Haroldine Laws, not candidate for advanced therapies.   2) Medtronic BiV ICD:  - ICD interrogated as above  3) HTN - Well-controlled. - Continue current, add back GDMT as able.   4) OSA - Sleep study with moderate OSA.  - Following with Dr. Radford Pax. Unable to tolerate any CPAP. - Dr Radford Pax referred to ENT for [facility] Device.   5) PAF (SCAF) - CHA2DS2-VASc 4. - Unable to complete ablation. Watchman scheduled 08/21/21 - Continue Xarelto 15 mg daily. No bleeding issues. - Continue  amiodarone 200 mg daily.  6) Obesity Body mass index is 36.88 kg/m. - Stable  7) CKD 3b - Continue Jardiance. - Last SCr 2.16. Labs in 1 week.  Follow up with Dr. Haroldine Laws in 2 months + echo.  Rafael Bihari, FNP  12:32 PM

## 2021-08-13 ENCOUNTER — Ambulatory Visit (HOSPITAL_COMMUNITY)
Admission: RE | Admit: 2021-08-13 | Discharge: 2021-08-13 | Disposition: A | Discharge status: Home / Self Care | Payer: Medicare PPO | Source: Ambulatory Visit | Attending: Family Medicine | Admitting: Family Medicine

## 2021-08-13 ENCOUNTER — Other Ambulatory Visit: Payer: Self-pay

## 2021-08-13 ENCOUNTER — Encounter (HOSPITAL_COMMUNITY): Payer: Self-pay

## 2021-08-13 VITALS — BP 112/80 | HR 81 | Wt 195.2 lb

## 2021-08-13 DIAGNOSIS — Z7901 Long term (current) use of anticoagulants: Secondary | ICD-10-CM | POA: Diagnosis not present

## 2021-08-13 DIAGNOSIS — Z6836 Body mass index (BMI) 36.0-36.9, adult: Secondary | ICD-10-CM | POA: Insufficient documentation

## 2021-08-13 DIAGNOSIS — G4733 Obstructive sleep apnea (adult) (pediatric): Secondary | ICD-10-CM | POA: Diagnosis not present

## 2021-08-13 DIAGNOSIS — Z9221 Personal history of antineoplastic chemotherapy: Secondary | ICD-10-CM | POA: Diagnosis not present

## 2021-08-13 DIAGNOSIS — Z79899 Other long term (current) drug therapy: Secondary | ICD-10-CM | POA: Diagnosis not present

## 2021-08-13 DIAGNOSIS — Z95810 Presence of automatic (implantable) cardiac defibrillator: Secondary | ICD-10-CM | POA: Insufficient documentation

## 2021-08-13 DIAGNOSIS — N1832 Chronic kidney disease, stage 3b: Secondary | ICD-10-CM | POA: Diagnosis not present

## 2021-08-13 DIAGNOSIS — I5022 Chronic systolic (congestive) heart failure: Secondary | ICD-10-CM | POA: Diagnosis not present

## 2021-08-13 DIAGNOSIS — Z8616 Personal history of COVID-19: Secondary | ICD-10-CM | POA: Insufficient documentation

## 2021-08-13 DIAGNOSIS — I480 Paroxysmal atrial fibrillation: Secondary | ICD-10-CM | POA: Insufficient documentation

## 2021-08-13 DIAGNOSIS — I130 Hypertensive heart and chronic kidney disease with heart failure and stage 1 through stage 4 chronic kidney disease, or unspecified chronic kidney disease: Secondary | ICD-10-CM | POA: Insufficient documentation

## 2021-08-13 DIAGNOSIS — Z7984 Long term (current) use of oral hypoglycemic drugs: Secondary | ICD-10-CM | POA: Diagnosis not present

## 2021-08-13 DIAGNOSIS — I10 Essential (primary) hypertension: Secondary | ICD-10-CM

## 2021-08-13 DIAGNOSIS — R42 Dizziness and giddiness: Secondary | ICD-10-CM | POA: Diagnosis not present

## 2021-08-13 DIAGNOSIS — E669 Obesity, unspecified: Secondary | ICD-10-CM | POA: Diagnosis not present

## 2021-08-13 DIAGNOSIS — Z853 Personal history of malignant neoplasm of breast: Secondary | ICD-10-CM | POA: Insufficient documentation

## 2021-08-13 DIAGNOSIS — Z9181 History of falling: Secondary | ICD-10-CM | POA: Diagnosis not present

## 2021-08-13 MED ORDER — POTASSIUM CHLORIDE CRYS ER 20 MEQ PO TBCR: 60.0000 meq | EXTENDED_RELEASE_TABLET | Freq: Two times a day (BID) | ORAL | 4 refills | Status: DC

## 2021-08-13 NOTE — Patient Instructions (Signed)
Thank for coming in  Your physician recommends that you return for lab work in: 1 week  Your physician recommends that you schedule a follow-up appointment in: keep follow up appointment  INCREASE Potassium to 60 meq 3 tablets twice daily   At the [facility], you and your health needs are our priority. As part of our continuing mission to provide you with exceptional heart care, we have created designated Provider Care Teams. These Care Teams include your primary Cardiologist (physician) and Advanced Practice Providers (APPs- Physician Assistants and Nurse Practitioners) who all work together to provide you with the care you need, when you need it.   You may see any of the following providers on your designated Care Team at your next follow up: Dr Glori Bickers Dr Haynes Kerns, NP Lyda Jester, [facility] [place], [place] Audry Riles, PharmD   Please be sure to bring in all your medications bottles to every appointment.    If you have any questions or concerns before your next appointment please send [place] a message through [place] or call our office at [phone].    TO LEAVE A MESSAGE FOR THE NURSE SELECT OPTION 2, PLEASE LEAVE A MESSAGE INCLUDING: YOUR NAME DATE OF BIRTH CALL BACK NUMBER REASON FOR CALL**this is important as we prioritize the call backs  YOU WILL RECEIVE A CALL BACK THE SAME DAY AS LONG AS YOU CALL BEFORE 4:00 PM

## 2021-08-15 NOTE — Progress Notes (Signed)
Remote ICD transmission.   

## 2021-08-18 ENCOUNTER — Encounter: Payer: Self-pay | Admitting: Cardiology

## 2021-08-20 ENCOUNTER — Telehealth: Payer: Self-pay

## 2021-08-20 NOTE — Progress Notes (Signed)
PCP - Clinton Quant at [facility] - Dr Fransico Him HF Cardiologist - Dr Haroldine Laws  Chest x-ray - DOS EKG - 07/01/21 Stress Test - 2007 TEE ECHO - DOS Cardiac Cath - 06/30/18  ICD Pacemaker/Loop - Medtronic Pacemaker Last remote check was on 08/06/21.  Sleep Study -  Yes CPAP - Does not use CPAP  Blood Thinner Instructions:  Follow your surgeon's instructions on when to stop prior to surgery.  Aspirin Instructions: Follow your surgeon's instructions on when to stop aspirin prior to surgery,  If no instructions were given by your surgeon then you will need to call the office for those instructions.  Anesthesia review: Yes  STOP now taking any Aspirin (unless otherwise instructed by your surgeon), Aleve, Naproxen, Ibuprofen, Motrin, Advil, Goody's, BC's, all herbal medications, fish oil, and all vitamins.   Coronavirus Screening Covid test is scheduled on DOS.

## 2021-08-20 NOTE — Telephone Encounter (Signed)
Called to make sure the patient has no questions about procedure tomorrow. Left message to call back if she has any questions at all.

## 2021-08-21 ENCOUNTER — Other Ambulatory Visit: Payer: Self-pay

## 2021-08-21 ENCOUNTER — Encounter (HOSPITAL_COMMUNITY)
Admission: RE | Disposition: A | Discharge status: Home / Self Care | Payer: Medicare PPO | Source: Home / Self Care | Attending: Cardiology

## 2021-08-21 ENCOUNTER — Inpatient Hospital Stay (HOSPITAL_COMMUNITY): Payer: Medicare PPO | Admitting: Anesthesiology

## 2021-08-21 ENCOUNTER — Inpatient Hospital Stay (HOSPITAL_COMMUNITY)
Admission: RE | Admit: 2021-08-21 | Discharge: 2021-08-22 | DRG: 274 | Disposition: A | Discharge status: Home / Self Care | Payer: Medicare PPO | Attending: Cardiology | Admitting: Cardiology

## 2021-08-21 ENCOUNTER — Encounter (HOSPITAL_COMMUNITY): Payer: Self-pay | Admitting: Cardiology

## 2021-08-21 ENCOUNTER — Ambulatory Visit (HOSPITAL_COMMUNITY): Payer: Medicare PPO

## 2021-08-21 ENCOUNTER — Inpatient Hospital Stay (HOSPITAL_COMMUNITY): Payer: Medicare PPO

## 2021-08-21 DIAGNOSIS — I480 Paroxysmal atrial fibrillation: Secondary | ICD-10-CM

## 2021-08-21 DIAGNOSIS — K219 Gastro-esophageal reflux disease without esophagitis: Secondary | ICD-10-CM | POA: Diagnosis present

## 2021-08-21 DIAGNOSIS — Z79899 Other long term (current) drug therapy: Secondary | ICD-10-CM | POA: Diagnosis not present

## 2021-08-21 DIAGNOSIS — T148XXA Other injury of unspecified body region, initial encounter: Secondary | ICD-10-CM

## 2021-08-21 DIAGNOSIS — E669 Obesity, unspecified: Secondary | ICD-10-CM | POA: Diagnosis present

## 2021-08-21 DIAGNOSIS — Z7901 Long term (current) use of anticoagulants: Secondary | ICD-10-CM

## 2021-08-21 DIAGNOSIS — I130 Hypertensive heart and chronic kidney disease with heart failure and stage 1 through stage 4 chronic kidney disease, or unspecified chronic kidney disease: Secondary | ICD-10-CM | POA: Diagnosis present

## 2021-08-21 DIAGNOSIS — R296 Repeated falls: Secondary | ICD-10-CM | POA: Diagnosis present

## 2021-08-21 DIAGNOSIS — Z006 Encounter for examination for normal comparison and control in clinical research program: Secondary | ICD-10-CM

## 2021-08-21 DIAGNOSIS — G4733 Obstructive sleep apnea (adult) (pediatric): Secondary | ICD-10-CM | POA: Diagnosis present

## 2021-08-21 DIAGNOSIS — G4700 Insomnia, unspecified: Secondary | ICD-10-CM | POA: Diagnosis present

## 2021-08-21 DIAGNOSIS — E785 Hyperlipidemia, unspecified: Secondary | ICD-10-CM | POA: Diagnosis present

## 2021-08-21 DIAGNOSIS — Z20822 Contact with and (suspected) exposure to covid-19: Secondary | ICD-10-CM | POA: Diagnosis present

## 2021-08-21 DIAGNOSIS — M109 Gout, unspecified: Secondary | ICD-10-CM | POA: Diagnosis present

## 2021-08-21 DIAGNOSIS — Z885 Allergy status to narcotic agent status: Secondary | ICD-10-CM | POA: Diagnosis not present

## 2021-08-21 DIAGNOSIS — I4819 Other persistent atrial fibrillation: Secondary | ICD-10-CM

## 2021-08-21 DIAGNOSIS — Z833 Family history of diabetes mellitus: Secondary | ICD-10-CM | POA: Diagnosis not present

## 2021-08-21 DIAGNOSIS — I5022 Chronic systolic (congestive) heart failure: Secondary | ICD-10-CM | POA: Diagnosis present

## 2021-08-21 DIAGNOSIS — I428 Other cardiomyopathies: Secondary | ICD-10-CM | POA: Diagnosis present

## 2021-08-21 DIAGNOSIS — Z8249 Family history of ischemic heart disease and other diseases of the circulatory system: Secondary | ICD-10-CM | POA: Diagnosis not present

## 2021-08-21 DIAGNOSIS — N1832 Chronic kidney disease, stage 3b: Secondary | ICD-10-CM | POA: Diagnosis present

## 2021-08-21 DIAGNOSIS — Z888 Allergy status to other drugs, medicaments and biological substances status: Secondary | ICD-10-CM

## 2021-08-21 DIAGNOSIS — Z9012 Acquired absence of left breast and nipple: Secondary | ICD-10-CM

## 2021-08-21 DIAGNOSIS — Z820 Family history of epilepsy and other diseases of the nervous system: Secondary | ICD-10-CM | POA: Diagnosis not present

## 2021-08-21 DIAGNOSIS — I4891 Unspecified atrial fibrillation: Secondary | ICD-10-CM | POA: Diagnosis present

## 2021-08-21 DIAGNOSIS — Z95810 Presence of automatic (implantable) cardiac defibrillator: Secondary | ICD-10-CM

## 2021-08-21 DIAGNOSIS — Z6837 Body mass index (BMI) 37.0-37.9, adult: Secondary | ICD-10-CM

## 2021-08-21 DIAGNOSIS — Z853 Personal history of malignant neoplasm of breast: Secondary | ICD-10-CM | POA: Diagnosis not present

## 2021-08-21 DIAGNOSIS — Z9181 History of falling: Secondary | ICD-10-CM

## 2021-08-21 DIAGNOSIS — Z95818 Presence of other cardiac implants and grafts: Secondary | ICD-10-CM

## 2021-08-21 HISTORY — PX: LEFT ATRIAL APPENDAGE OCCLUSION: EP1229

## 2021-08-21 HISTORY — PX: TEE WITHOUT CARDIOVERSION: SHX5443

## 2021-08-21 LAB — TYPE AND SCREEN
ABO/RH(D): A NEG
Antibody Screen: NEGATIVE

## 2021-08-21 LAB — BASIC METABOLIC PANEL
Anion gap: 11 (ref 5–15)
BUN: 36 mg/dL — ABNORMAL HIGH (ref 8–23)
CO2: 32 mmol/L (ref 22–32)
Calcium: 9.4 mg/dL (ref 8.9–10.3)
Chloride: 96 mmol/L — ABNORMAL LOW (ref 98–111)
Creatinine, Ser: 2.18 mg/dL — ABNORMAL HIGH (ref 0.44–1.00)
GFR, Estimated: 24 mL/min — ABNORMAL LOW (ref >60)
Glucose, Bld: 124 mg/dL — ABNORMAL HIGH (ref 70–99)
Potassium: 2.8 mmol/L — ABNORMAL LOW (ref 3.5–5.1)
Sodium: 139 mmol/L (ref 135–145)

## 2021-08-21 LAB — SURGICAL PCR SCREEN
MRSA, PCR: NEGATIVE
Staphylococcus aureus: NEGATIVE

## 2021-08-21 LAB — POCT ACTIVATED CLOTTING TIME: Activated Clotting Time: 389 seconds

## 2021-08-21 LAB — SARS CORONAVIRUS 2 BY RT PCR (HOSPITAL ORDER, PERFORMED IN ~~LOC~~ HOSPITAL LAB): SARS Coronavirus 2: NEGATIVE

## 2021-08-21 SURGERY — LEFT ATRIAL APPENDAGE OCCLUSION
Anesthesia: General

## 2021-08-21 MED ORDER — SODIUM CHLORIDE 0.9 % IV SOLN: INTRAVENOUS | Status: DC

## 2021-08-21 MED ORDER — ONDANSETRON HCL 4 MG/2ML IJ SOLN
INTRAMUSCULAR | Status: DC | PRN
  Administered 2021-08-21: 4 mg via INTRAVENOUS

## 2021-08-21 MED ORDER — PANTOPRAZOLE SODIUM 40 MG PO TBEC
40.0000 mg | DELAYED_RELEASE_TABLET | Freq: Every day | ORAL | Status: DC
  Administered 2021-08-21 – 2021-08-22 (×2): 40 mg via ORAL
  Filled 2021-08-21 (×2): qty 1

## 2021-08-21 MED ORDER — AMIODARONE HCL 200 MG PO TABS
200.0000 mg | ORAL_TABLET | Freq: Every day | ORAL | Status: DC
  Administered 2021-08-21 – 2021-08-22 (×2): 200 mg via ORAL
  Filled 2021-08-21 (×2): qty 1

## 2021-08-21 MED ORDER — EMPAGLIFLOZIN 10 MG PO TABS
10.0000 mg | ORAL_TABLET | Freq: Every day | ORAL | Status: DC
  Administered 2021-08-22: 10 mg via ORAL
  Filled 2021-08-21: qty 1

## 2021-08-21 MED ORDER — LACTATED RINGERS IV SOLN: INTRAVENOUS | Status: DC

## 2021-08-21 MED ORDER — HEPARIN (PORCINE) IN NACL 1000-0.9 UT/500ML-% IV SOLN
INTRAVENOUS | Status: AC
  Filled 2021-08-21: qty 500

## 2021-08-21 MED ORDER — ROCURONIUM BROMIDE 10 MG/ML (PF) SYRINGE
PREFILLED_SYRINGE | INTRAVENOUS | Status: DC | PRN
  Administered 2021-08-21: 50 mg via INTRAVENOUS

## 2021-08-21 MED ORDER — FENTANYL CITRATE (PF) 100 MCG/2ML IJ SOLN: 25.0000 ug | INTRAMUSCULAR | Status: DC | PRN

## 2021-08-21 MED ORDER — CEFAZOLIN SODIUM-DEXTROSE 2-4 GM/100ML-% IV SOLN
2.0000 g | INTRAVENOUS | Status: AC
  Administered 2021-08-21: 2 g via INTRAVENOUS
  Filled 2021-08-21: qty 100

## 2021-08-21 MED ORDER — PHENYLEPHRINE HCL-NACL 20-0.9 MG/250ML-% IV SOLN
INTRAVENOUS | Status: DC | PRN
  Administered 2021-08-21: 25 ug/min via INTRAVENOUS

## 2021-08-21 MED ORDER — ONDANSETRON HCL 4 MG/2ML IJ SOLN: 4.0000 mg | Freq: Four times a day (QID) | INTRAMUSCULAR | Status: DC | PRN

## 2021-08-21 MED ORDER — SODIUM CHLORIDE 0.9% FLUSH: 3.0000 mL | INTRAVENOUS | Status: DC | PRN

## 2021-08-21 MED ORDER — ATORVASTATIN CALCIUM 10 MG PO TABS
10.0000 mg | ORAL_TABLET | Freq: Every day | ORAL | Status: DC
  Administered 2021-08-21 – 2021-08-22 (×2): 10 mg via ORAL
  Filled 2021-08-21 (×2): qty 1

## 2021-08-21 MED ORDER — ZOLPIDEM TARTRATE 5 MG PO TABS
5.0000 mg | ORAL_TABLET | Freq: Every day | ORAL | Status: DC
  Administered 2021-08-21: 5 mg via ORAL
  Filled 2021-08-21: qty 1

## 2021-08-21 MED ORDER — HEPARIN (PORCINE) IN NACL 2000-0.9 UNIT/L-% IV SOLN
INTRAVENOUS | Status: DC | PRN
  Administered 2021-08-21: 1000 mL

## 2021-08-21 MED ORDER — PROTAMINE SULFATE 10 MG/ML IV SOLN
INTRAVENOUS | Status: DC | PRN
  Administered 2021-08-21: 15 mg via INTRAVENOUS
  Administered 2021-08-21: 20 mg via INTRAVENOUS

## 2021-08-21 MED ORDER — SUGAMMADEX SODIUM 200 MG/2ML IV SOLN
INTRAVENOUS | Status: DC | PRN
  Administered 2021-08-21: 300 mg via INTRAVENOUS

## 2021-08-21 MED ORDER — RIVAROXABAN 15 MG PO TABS
15.0000 mg | ORAL_TABLET | Freq: Every day | ORAL | Status: DC
  Administered 2021-08-21: 15 mg via ORAL
  Filled 2021-08-21: qty 1

## 2021-08-21 MED ORDER — SODIUM CHLORIDE 0.9% FLUSH
3.0000 mL | Freq: Two times a day (BID) | INTRAVENOUS | Status: DC
  Administered 2021-08-21 – 2021-08-22 (×3): 3 mL via INTRAVENOUS

## 2021-08-21 MED ORDER — HEPARIN (PORCINE) IN NACL 2000-0.9 UNIT/L-% IV SOLN
INTRAVENOUS | Status: AC
  Filled 2021-08-21: qty 1000

## 2021-08-21 MED ORDER — DEXAMETHASONE SODIUM PHOSPHATE 10 MG/ML IJ SOLN
INTRAMUSCULAR | Status: DC | PRN
  Administered 2021-08-21: 4 mg via INTRAVENOUS

## 2021-08-21 MED ORDER — POTASSIUM CHLORIDE 10 MEQ/100ML IV SOLN
10.0000 meq | INTRAVENOUS | Status: DC
  Administered 2021-08-21 (×3): 10 meq via INTRAVENOUS
  Filled 2021-08-21 (×2): qty 100

## 2021-08-21 MED ORDER — AMISULPRIDE (ANTIEMETIC) 5 MG/2ML IV SOLN: 10.0000 mg | Freq: Once | INTRAVENOUS | Status: DC | PRN

## 2021-08-21 MED ORDER — HEPARIN (PORCINE) IN NACL 1000-0.9 UT/500ML-% IV SOLN
INTRAVENOUS | Status: DC | PRN
  Administered 2021-08-21: 500 mL

## 2021-08-21 MED ORDER — ACETAMINOPHEN 325 MG PO TABS: 650.0000 mg | ORAL_TABLET | ORAL | Status: DC | PRN

## 2021-08-21 MED ORDER — BUPROPION HCL ER (SR) 150 MG PO TB12
150.0000 mg | ORAL_TABLET | Freq: Two times a day (BID) | ORAL | Status: DC
  Administered 2021-08-21 – 2021-08-22 (×3): 150 mg via ORAL
  Filled 2021-08-21 (×4): qty 1

## 2021-08-21 MED ORDER — ALLOPURINOL 100 MG PO TABS
100.0000 mg | ORAL_TABLET | Freq: Every day | ORAL | Status: DC
  Administered 2021-08-21: 100 mg via ORAL
  Filled 2021-08-21: qty 1

## 2021-08-21 MED ORDER — HEPARIN SODIUM (PORCINE) 1000 UNIT/ML IJ SOLN
INTRAMUSCULAR | Status: DC | PRN
  Administered 2021-08-21: 12000 [IU] via INTRAVENOUS

## 2021-08-21 MED ORDER — VENLAFAXINE HCL ER 150 MG PO CP24
150.0000 mg | ORAL_CAPSULE | Freq: Every day | ORAL | Status: DC
  Administered 2021-08-22: 150 mg via ORAL
  Filled 2021-08-21: qty 1

## 2021-08-21 MED ORDER — PROPOFOL 10 MG/ML IV BOLUS
INTRAVENOUS | Status: DC | PRN
  Administered 2021-08-21: 100 mg via INTRAVENOUS

## 2021-08-21 MED ORDER — LIDOCAINE 2% (20 MG/ML) 5 ML SYRINGE
INTRAMUSCULAR | Status: DC | PRN
  Administered 2021-08-21: 60 mg via INTRAVENOUS

## 2021-08-21 MED ORDER — SODIUM CHLORIDE 0.9 % IV SOLN: 250.0000 mL | INTRAVENOUS | Status: DC | PRN

## 2021-08-21 MED ORDER — CHLORHEXIDINE GLUCONATE 0.12 % MT SOLN
15.0000 mL | Freq: Once | OROMUCOSAL | Status: AC
  Administered 2021-08-21: 15 mL via OROMUCOSAL
  Filled 2021-08-21 (×2): qty 15

## 2021-08-21 MED ORDER — PHENYLEPHRINE HCL (PRESSORS) 10 MG/ML IV SOLN
INTRAVENOUS | Status: DC | PRN
  Administered 2021-08-21: 120 ug via INTRAVENOUS

## 2021-08-21 MED ORDER — FENTANYL CITRATE (PF) 100 MCG/2ML IJ SOLN
INTRAMUSCULAR | Status: AC
  Filled 2021-08-21: qty 2

## 2021-08-21 MED ORDER — IOHEXOL 350 MG/ML SOLN
INTRAVENOUS | Status: DC | PRN
  Administered 2021-08-21: 5 mL

## 2021-08-21 MED ORDER — BISOPROLOL FUMARATE 10 MG PO TABS
10.0000 mg | ORAL_TABLET | Freq: Every day | ORAL | Status: DC
  Administered 2021-08-22: 10 mg via ORAL
  Filled 2021-08-21: qty 1

## 2021-08-21 SURGICAL SUPPLY — 19 items
BAG SNAP BAND KOVER 36X36 (MISCELLANEOUS) ×2 IMPLANT
CATH DIAG 6FR PIGTAIL ANGLED (CATHETERS) ×2 IMPLANT
CLOSURE PERCLOSE PROSTYLE (VASCULAR PRODUCTS) ×4 IMPLANT
DEVICE WATCHMAN FLX PROC (KITS) IMPLANT
DILATOR VESSEL 38 20CM 16FR (INTRODUCER) ×2 IMPLANT
KIT HEART LEFT (KITS) ×3 IMPLANT
KIT SHEA VERSACROSS LAAC CONNE (KITS) ×2 IMPLANT
PACK CARDIAC CATHETERIZATION (CUSTOM PROCEDURE TRAY) ×3 IMPLANT
PAD DEFIB RADIO PHYSIO CONN (PAD) ×3 IMPLANT
SHEATH PERFORMER 16FR 30 (SHEATH) ×2 IMPLANT
SHEATH PINNACLE 8F 10CM (SHEATH) ×2 IMPLANT
SHEATH PROBE COVER 6X72 (BAG) ×5 IMPLANT
SYS WATCHMAN FXD DBL (SHEATH) ×2
SYSTEM WATCHMAN FXD DBL (SHEATH) IMPLANT
TRANSDUCER W/STOPCOCK (MISCELLANEOUS) ×3 IMPLANT
TUBING CIL FLEX 10 FLL-RA (TUBING) ×5 IMPLANT
WATCHMAN FLX 31 (Prosthesis & Implant Heart) ×2 IMPLANT
WATCHMAN FLX PROCEDURE DEVICE (KITS) ×2 IMPLANT
WATCHMAN PROCED TRUSEAL ACCESS (SHEATH) ×2 IMPLANT

## 2021-08-21 NOTE — Interval H&P Note (Signed)
History and Physical Interval Note:  08/21/2021 11:31 AM  [name]  has presented today for surgery, with the diagnosis of afib.  The various methods of treatment have been discussed with the patient and family. After consideration of risks, benefits and other options for treatment, the patient has consented to  Procedure(s): LEFT ATRIAL APPENDAGE OCCLUSION (N/A) TRANSESOPHAGEAL ECHOCARDIOGRAM (TEE) (N/A) as a surgical intervention.  The patient's history has been reviewed, patient examined, no change in status, stable for surgery.  I have reviewed the patient's chart and labs.  Questions were answered to the patient's satisfaction.     [name] T Lanesha Azzaro

## 2021-08-21 NOTE — Anesthesia Procedure Notes (Signed)
Procedure Name: Intubation Date/Time: 08/21/2021 11:50 AM Performed by: Renato Shin, CRNA Pre-anesthesia Checklist: Patient identified, Emergency Drugs available, Suction available and Patient being monitored Patient Re-evaluated:Patient Re-evaluated prior to induction Oxygen Delivery Method: Circle system utilized Preoxygenation: Pre-oxygenation with 100% oxygen Induction Type: IV induction Ventilation: Mask ventilation without difficulty Laryngoscope Size: Miller and 2 Grade View: Grade I Tube type: Oral Tube size: 7.0 mm Number of attempts: 1 Airway Equipment and Method: Stylet and Oral airway Placement Confirmation: ETT inserted through vocal cords under direct vision, positive ETCO2 and breath sounds checked- equal and bilateral Secured at: 21 cm Tube secured with: Tape Dental Injury: Teeth and Oropharynx as per pre-operative assessment

## 2021-08-21 NOTE — Anesthesia Postprocedure Evaluation (Signed)
Anesthesia Post Note  Patient: [name]  Procedure(s) Performed: LEFT ATRIAL APPENDAGE OCCLUSION TRANSESOPHAGEAL ECHOCARDIOGRAM (TEE)     Patient location during evaluation: ICU Anesthesia Type: General Level of consciousness: awake Pain management: pain level controlled Vital Signs Assessment: post-procedure vital signs reviewed and stable Respiratory status: spontaneous breathing Cardiovascular status: stable Postop Assessment: no apparent nausea or vomiting Anesthetic complications: no   No notable events documented.  Last Vitals:  Vitals:   08/21/21 1442 08/21/21 1622  BP: (!) 85/46 (!) 95/54  Pulse: 98 71  Resp: 16 10  Temp: 36.5 C 36.6 C  SpO2: 95% 96%    Last Pain:  Vitals:   08/21/21 1622  TempSrc: Oral  PainSc:                  [name]

## 2021-08-21 NOTE — Progress Notes (Signed)
° ° °  Pt seen after transfer to [facility]. Doing well with no specific complaints. BP a little soft however she is asymptomatic. Will continue to monitor for now. Groin site stable with no s/s of hematoma or bleeding. Plan for overnight stay and early d/c in the AM if no complications.  Kathyrn Drown NP-C Structural Heart Team  Pager: [phone]

## 2021-08-21 NOTE — Anesthesia Preprocedure Evaluation (Signed)
Anesthesia Evaluation  Patient identified by MRN, date of birth, ID band Patient awake    Reviewed: Allergy & Precautions, NPO status , Patient's Chart, lab work & pertinent test results  History of Anesthesia Complications (+) PONV  Airway Mallampati: II  TM Distance: >3 FB Neck ROM: Full    Dental no notable dental hx.    Pulmonary sleep apnea ,    Pulmonary exam normal        Cardiovascular Pt. on home beta blockers +CHF  + dysrhythmias Atrial Fibrillation + pacemaker + Cardiac Defibrillator (2007 implant)  Rhythm:Irregular Rate:Normal     Neuro/Psych negative neurological ROS  negative psych ROS   GI/Hepatic Neg liver ROS, GERD  Medicated,  Endo/Other  negative endocrine ROS  Renal/GU CRFRenal disease  negative genitourinary   Musculoskeletal negative musculoskeletal ROS (+)   Abdominal Normal abdominal exam  (+)   Peds  Hematology negative hematology ROS (+) Lab Results      Component                Value               Date                      WBC                      7.9                 07/29/2021                HGB                      11.8                07/29/2021                HCT                      37.1                07/29/2021                MCV                      98 (H)              07/29/2021                PLT                      334                 07/29/2021           Lab Results      Component                Value               Date                      NA                       139                 08/21/2021  K                        2.8 (L)             08/21/2021                CO2                      32                  08/21/2021                GLUCOSE                  124 (H)             08/21/2021                BUN                      36 (H)              08/21/2021                CREATININE               2.18 (H)            08/21/2021                CALCIUM                   9.4                 08/21/2021                EGFR                     22 (L)              07/29/2021                GFRNONAA                 24 (L)              08/21/2021             Anesthesia Other Findings   Reproductive/Obstetrics                            Anesthesia Physical Anesthesia Plan  ASA: 4  Anesthesia Plan: General   Post-op Pain Management:    Induction: Intravenous  PONV Risk Score and Plan: 4 or greater and Ondansetron, Dexamethasone and Treatment may vary due to age or medical condition  Airway Management Planned: Mask and Oral ETT  Additional Equipment: None  Intra-op Plan:   Post-operative Plan: Extubation in OR  Informed Consent: I have reviewed the patients History and Physical, chart, labs and discussed the procedure including the risks, benefits and alternatives for the proposed anesthesia with the patient or authorized representative who has indicated his/her understanding and acceptance.     Dental advisory given  Plan Discussed with: CRNA  Anesthesia Plan Comments: (- CLEARSIGHT - K 2.8, will begin supplementation in pre-op area with continuation intraop  ECHO 10/22: 1. Left ventricular ejection fraction, by estimation, is 25 to 30%. The  left ventricle has severely decreased function. The left ventricle  demonstrates regional  wall motion abnormalities (see scoring  diagram/findings for description). The left  ventricular internal cavity size was mildly dilated. Left ventricular  diastolic parameters are indeterminate. There is the interventricular  septum is flattened in diastole ('D' shaped left ventricle), consistent  with right ventricular volume overload.  The abnormal septal motion may be due to RV pacing.  2. Right ventricular systolic function is normal. The right ventricular  size is normal. There is normal pulmonary artery systolic pressure.  3. Right atrial size was moderately dilated.   4. The mitral valve is grossly normal. Trivial mitral valve  regurgitation.  5. There appears to be septal leaflet prolaps ( partial flail) associated  with a very eccentric TR jet. This is not significantly changed from  previous echoes. . The tricuspid valve is degenerative. Tricuspid valve  regurgitation is moderate to severe.  6. The aortic valve is tricuspid. Aortic valve regurgitation is not  visualized. No aortic stenosis is present. )        Anesthesia Quick Evaluation

## 2021-08-21 NOTE — Transfer of Care (Signed)
Immediate Anesthesia Transfer of Care Note  Patient: [name]  Procedure(s) Performed: LEFT ATRIAL APPENDAGE OCCLUSION TRANSESOPHAGEAL ECHOCARDIOGRAM (TEE)  Patient Location: PACU and Cath Lab  Anesthesia Type:General  Level of Consciousness: awake and patient cooperative  Airway & Oxygen Therapy: Patient Spontanous Breathing and Patient connected to nasal cannula oxygen  Post-op Assessment: Report given to RN and Post -op Vital signs reviewed and stable  Post vital signs: Reviewed and stable  Last Vitals:  Vitals Value Taken Time  BP 88/43 08/21/21 1315  Temp 36.4 C 08/21/21 1311  Pulse 67 08/21/21 1315  Resp 16 08/21/21 1315  SpO2 95 % 08/21/21 1315  Vitals shown include unvalidated device data.  Last Pain:  Vitals:   08/21/21 1311  TempSrc: Temporal  PainSc: 0-No pain         Complications: No notable events documented.

## 2021-08-22 ENCOUNTER — Encounter (HOSPITAL_COMMUNITY): Payer: Self-pay | Admitting: Cardiology

## 2021-08-22 LAB — BASIC METABOLIC PANEL
Anion gap: 9 (ref 5–15)
BUN: 34 mg/dL — ABNORMAL HIGH (ref 8–23)
CO2: 29 mmol/L (ref 22–32)
Calcium: 9.3 mg/dL (ref 8.9–10.3)
Chloride: 95 mmol/L — ABNORMAL LOW (ref 98–111)
Creatinine, Ser: 2.27 mg/dL — ABNORMAL HIGH (ref 0.44–1.00)
GFR, Estimated: 23 mL/min — ABNORMAL LOW (ref >60)
Glucose, Bld: 167 mg/dL — ABNORMAL HIGH (ref 70–99)
Potassium: 3.1 mmol/L — ABNORMAL LOW (ref 3.5–5.1)
Sodium: 133 mmol/L — ABNORMAL LOW (ref 135–145)

## 2021-08-22 MED ORDER — PANTOPRAZOLE SODIUM 40 MG PO TBEC: 40.0000 mg | DELAYED_RELEASE_TABLET | Freq: Every day | ORAL | 3 refills | Status: AC

## 2021-08-22 MED ORDER — POTASSIUM CHLORIDE CRYS ER 20 MEQ PO TBCR
40.0000 meq | EXTENDED_RELEASE_TABLET | Freq: Once | ORAL | Status: AC
  Administered 2021-08-22: 40 meq via ORAL
  Filled 2021-08-22: qty 2

## 2021-08-22 NOTE — Op Note (Signed)
°  [place] TEAM  Watchman [place] Procedure   Surgeon: Lars Mage, MD Co-Surgeon: Sherren Mocha, MD Imager: Sanda Klein, MD   Procedure: 1) Transseptal Puncture 2) Watchman left atrial appendage occlusion 3) Perclose right femoral vein   Device Implant: 31 mm Watchman Flex Device, Lot # [id number]   Background and Indication: 65 yo with paroxysmal atrial fibrillation, frequent falls, felt to be a poor candidate for long term anticoagulation. She is referred for the above procedure   Procedure description: US guidance is used for right femoral venous access. [place] images are stored digitally in the patient's chart. Double Preclose is performed at 10" and 2" positions using normal technique and a after progressively dilating the femoral vein over an Amplatz Superstiff wire, a 16 Fr sheath is inserted. Transseptal puncture is then performed after fully anticoagulating the patient with unfractionated heparin. A therapeutic ACT greater than 250 seconds is achieved. A Versacross system is used with RF energy to cross the interatrial septum under fluoroscopic and TEE guidance. Please see Dr Mardene Speak detailed report for further description of transseptal puncture and Watchman Access Sheath insertion.   Once the 14 Fr access sheath is in appropriate position in the left atrial appendage, a 31 mm Watchman Flex device is inserted and deployed, demonstrating appropriate position and compression in the left atrial appendage. Thorough TEE imaging is performed to evaluate all PASS criteria prior to device release. After device release, the device remains in stable position with complete occlusion of the appendage. The sheath is removed from the body and the previously deployed perclose sutures are tightened for complete hemostasis.   Conclusion: Successful implantation of a 31 mm Watchman left atrial appendage occlusion device using fluoroscopic and  transesophageal echo guidance  Sherren Mocha 08/22/2021 6:07 AM

## 2021-08-22 NOTE — Discharge Summary (Signed)
[facility]    Patient ID: [name],  MRN: [id number], DOB/AGE: [birth year] 65 y.o.  Admit date: 08/21/2021 Discharge date: 08/22/2021  Primary Care Physician: Wayna Chalet, NP  Primary Cardiologist: None  Electrophysiologist: Virl Axe, MD  Primary Discharge Diagnosis:  Persistent Atrial Fibrillation felt to be a poor candidate for long term anticoagulation due to  frequent falls and bleeding.   Secondary Discharge Diagnosis:  Recent failed AF ablation, breast CA treated with Adriamycin x 4 cycles, non-ischemic cardiomyopathy/chronic systolic CHF with prior EF now at 50-55% s/p BiV Medtronic ICD, HTN, OSA, and CKD Stage IIIb   Procedures This Admission:  Transeptal Puncture Intra-procedural TEE which showed no LAA thrombus Left atrial appendage occlusive device placement on 08/21/21 by Dr. Quentin Ore.   This study demonstrated: Successful implantation of a 31 mm Watchman left atrial appendage occlusion device using fluoroscopic and transesophageal echo guidance    Brief HPI: [name] is a 65 y.o. female with a history of persistent atrial fibrillation with recent failed ablation, breast CA treated with Adriamycin x 4 cycles, non-ischemic cardiomyopathy/chronic systolic CHF with prior EF now at 50-55% s/p BiV Medtronic ICD, HTN, OSA, and CKD Stage IIIb who was referred to Dr. Quentin Ore for atrial fibrillation ablation and Watchman implant.   On initial evaluation 05/06/21 the patient had been seen by Dr. Caryl Comes 04/18/21 at which time the patient was started on amiodarone but failed therapy. She was noted to have a hx of severe bruising and falls therefore she was sent to Dr. Quentin Ore for the evaluation of possible AF ablation with Watchman implant to follow. CT imaging showed anatomy suitable to move forward. Unfortunately the patient presented 06/30/21 for procedure however this was ultimately aborted secondary pericardial fluid observed with ICE  after transseptal puncture. Post procedure echo showed  no pericardial effusion, known NICM. She was discharged and seen back with Dr. Quentin Ore to move forward with Watchman implant.  Hospital Course:  The patient was admitted and underwent left atrial appendage occlusive device placement with 31 mm Watchman using fluoroscopic and transesophageal echo guidance. She was monitored on telemetry overnight which demonstrated AV paced rhythm. Groin site was without complication on the day of discharge. The patient was examined and considered to be stable for discharge.  Wound care and restrictions were reviewed with the patient. The patient has been scheduled for post procedure follow up with Kathyrn Drown, NP in approximately one month. Medication plan will be to restart home Xarelto 15mg  monotherapy. A repeat TEE at approximately 45 days will be performed to ensure proper seal of the device. At that time, the patient will transition to Plavix 75mg  PO daily if the 45-day TEE shows no thrombus or significant leak to complete 6 months of post implant therapy. The patient will then follow with Dr. Quentin Ore in 12 weeks for post closure follow up.   Physical Exam: Vitals:   08/21/21 1900 08/21/21 2300 08/22/21 0300 08/22/21 0735  BP: (!) 98/59 (!) 90/55 (!) 120/56 (!) 96/49  Pulse: 74 73 72 72  Resp: 20 16 18 20   Temp: 98 F (36.7 C) 97.6 F (36.4 C) 97.9 F (36.6 C) 97.7 F (36.5 C)  TempSrc: Oral Oral Oral Oral  SpO2: 94% 95% 95% 94%  Weight:      Height:       General: Well developed, well nourished, NAD Neck: Negative for carotid bruits. No JVD Lungs:Clear to ausculation bilaterally. Breathing is unlabored. Cardiovascular: Paced. No murmurs  Extremities: No edema. Groin site stable with no s/s of hematoma or bleeding.  Neuro: Alert and oriented. No focal deficits. No facial asymmetry. MAE spontaneously. Psych: Responds to questions appropriately with normal affect.    Labs:   Lab Results   Component Value Date   WBC 7.9 07/29/2021   HGB 11.8 07/29/2021   HCT 37.1 07/29/2021   MCV 98 (H) 07/29/2021   PLT 334 07/29/2021    Recent Labs  Lab 08/22/21 0043  NA 133*  K 3.1*  CL 95*  CO2 29  BUN 34*  CREATININE 2.27*  CALCIUM 9.3  GLUCOSE 167*    Discharge Medications:  Allergies as of 08/22/2021       Reactions   Buprenorphine Hcl    Patient required emergent use of naloxone and naloxone drip in ICU for only taking Percocet 5-325 over the course of one day. She appears to have impaired metabolism of opioid medications and providers should use these medications with extreme caution.   Morphine And Related Other (See Comments)   Patient required emergent use of naloxone and naloxone drip in ICU for only taking Percocet 5-325 over the course of one day. She appears to have impaired metabolism of opioid medications and providers should use these medications with extreme caution. Patient required emergent use of naloxone and naloxone drip in ICU for only taking Percocet 5-325 over the course of one day. She appears to have impaired metabolism of opioid medications and providers should use these medications with extreme caution.   Percocet [oxycodone-acetaminophen] Shortness Of Breath   Hospitalized   Oxycodone Other (See Comments)   Mental status changes        Medication List     STOP taking these medications    omeprazole 20 MG capsule Commonly known as: PRILOSEC Replaced by: pantoprazole 40 MG tablet       TAKE these medications    alendronate 70 MG tablet Commonly known as: FOSAMAX Take 70 mg by mouth every Monday.   allopurinol 100 MG tablet Commonly known as: ZYLOPRIM Take 100 mg by mouth daily in the afternoon.   amiodarone 200 MG tablet Commonly known as: PACERONE Take 200 mg by mouth daily.   atorvastatin 10 MG tablet Commonly known as: LIPITOR Take 10 mg by mouth in the morning.   bisoprolol 5 MG tablet Commonly known as:  ZEBETA Take 1 tablet (5 mg total) by mouth daily. What changed:  how much to take when to take this   buPROPion 150 MG 12 hr tablet Commonly known as: WELLBUTRIN SR Take 150 mg by mouth 2 (two) times daily.   empagliflozin 10 MG Tabs tablet Commonly known as: Jardiance Take 1 tablet (10 mg total) by mouth daily before breakfast.   metolazone 2.5 MG tablet Commonly known as: ZAROXOLYN Take 2.5 mg by mouth 2 (two) times a week. Tuesdays and Thursdays.   multivitamin with minerals Tabs tablet Take 1 tablet by mouth in the morning. One-A-Day for Women 50+   pantoprazole 40 MG tablet Commonly known as: PROTONIX Take 1 tablet (40 mg total) by mouth daily. Replaces: omeprazole 20 MG capsule   potassium chloride SA 20 MEQ tablet Commonly known as: KLOR-CON M Take 3 tablets (60 mEq total) by mouth 2 (two) times daily. What changed:  how much to take when to take this   Rivaroxaban 15 MG Tabs tablet Commonly known as: Xarelto Take 1 tablet (15 mg total) by mouth daily.   spironolactone 25 MG tablet Commonly known  as: ALDACTONE Take 25 mg by mouth daily in the afternoon.   torsemide 20 MG tablet Commonly known as: DEMADEX TAKE 1 TABLET EVERY DAY What changed:  how much to take when to take this   venlafaxine XR 150 MG 24 hr capsule Commonly known as: EFFEXOR-XR Take 150 mg by mouth daily with breakfast.   Vitamin B-12 5000 MCG Subl Place 5,000 mcg under the tongue in the morning.   Vitamin D3 125 MCG (5000 UT) Tabs Take 5,000 Units by mouth in the morning.   zolpidem 10 MG tablet Commonly known as: AMBIEN Take 10 mg by mouth at bedtime.         Disposition:  Home  Discharge Instructions     Call MD for:  difficulty breathing, headache or visual disturbances   Complete by: As directed    Call MD for:  difficulty breathing, headache or visual disturbances   Complete by: As directed    Call MD for:  extreme fatigue   Complete by: As directed    Call MD  for:  extreme fatigue   Complete by: As directed    Call MD for:  hives   Complete by: As directed    Call MD for:  hives   Complete by: As directed    Call MD for:  persistant dizziness or light-headedness   Complete by: As directed    Call MD for:  persistant dizziness or light-headedness   Complete by: As directed    Call MD for:  persistant nausea and vomiting   Complete by: As directed    Call MD for:  persistant nausea and vomiting   Complete by: As directed    Call MD for:  redness, tenderness, or signs of infection (pain, swelling, redness, odor or green/yellow discharge around incision site)   Complete by: As directed    Call MD for:  redness, tenderness, or signs of infection (pain, swelling, redness, odor or green/yellow discharge around incision site)   Complete by: As directed    Call MD for:  severe uncontrolled pain   Complete by: As directed    Call MD for:  severe uncontrolled pain   Complete by: As directed    Call MD for:  temperature >100.4   Complete by: As directed    Call MD for:  temperature >100.4   Complete by: As directed    Diet - low sodium heart healthy   Complete by: As directed    Diet - low sodium heart healthy   Complete by: As directed    Discharge instructions   Complete by: As directed    Some studies suggest Prilosec/Omeprazole interacts with Plavix (which you will eventually be transitioned to) therefore  we changed your Prilosec/Omeprazole to the equivalent dose of Protonix for less chance of interaction.   WATCHMAN Procedure, Care After  Procedure MD: Dr. Benson Norway Clinical Coordinator: Lenice Llamas, RN  This sheet gives you information about how to care for yourself after your procedure. Your health care provider may also give you more specific instructions. If you have problems or questions, contact your health care provider.  What can I expect after the procedure? After the procedure, it is common to have: Bruising around  your puncture site. Tenderness around your puncture site. Tiredness (fatigue).  Medication instructions It is very important to continue to take your blood thinner as directed by your doctor after the Watchman procedure. Call your procedure doctor's office with question or concerns. If you  are on Coumadin (warfarin), you will have your INR checked the week after your procedure, with a goal INR of 2.0 - 3.0. Please follow your medication instructions on your discharge summary. Only take the medications listed on your discharge paperwork.  Follow up You will be seen in 1 month after your procedure in the [facility] [place] will have another TEE (Transesophageal Echocardiogram) approximately 6 weeks after your procedure mark to check your device You will follow up the MD/APP who performed your procedure 6 months after your procedure The Watchman Clinical Coordinator will check in with you from time to time, including 1 and 2 years after your procedure.    Follow these instructions at home: Puncture site care  Follow instructions from your health care provider about how to take care of your puncture site. Make sure you: If present, leave stitches (sutures), skin glue, or adhesive strips in place.  If a large square bandage is present, this may be removed 24 hours after surgery.  Check your puncture site every day for signs of infection. Check for: Redness, swelling, or pain. Fluid or blood. If your puncture site starts to bleed, lie down on your back, apply firm pressure to the area, and contact your health care provider. Warmth. Pus or a bad smell. Driving Do not drive yourself home if you received sedation Do not drive for at least 4 days after your procedure or however long your health care provider recommends. (Do not resume driving if you have previously been instructed not to drive for other health reasons.) Do not spend greater than 1 hour at a time in a car for the first  3 days. Stop and take a break with a 5 minute walk at least every hour.  Do not drive or use heavy machinery while taking prescription pain medicine.  Activity Avoid activities that take a lot of effort, including exercise, for at least 7 days after your procedure. For the first 3 days, avoid sitting for longer than one hour at a time.  Avoid alcoholic beverages, signing paperwork, or participating in legal proceedings for 24 hours after receiving sedation Do not lift anything that is heavier than 10 lb (4.5 kg) for one week.  No sexual activity for 1 week.  Return to your normal activities as told by your health care provider. Ask your health care provider what activities are safe for you. General instructions Take over-the-counter and prescription medicines only as told by your health care provider. Do not use any products that contain nicotine or tobacco, such as cigarettes and e-cigarettes. If you need help quitting, ask your health care provider. You may shower after 24 hours, but Do not take baths, swim, or use a hot tub for 1 week.  Do not drink alcohol for 24 hours after your procedure. Keep all follow-up visits as told by your health care provider. This is important. Dental Work: You will require antibiotics prior to any dental work, including cleanings, for 6 months after your Watchman implantation to help protect you from infection. After 6 months, antibiotics are no longer required. Contact a health care provider if: You have redness, mild swelling, or pain around your puncture site. You have soreness in your throat or at your puncture site that does not improve after several days You have fluid or blood coming from your puncture site that stops after applying firm pressure to the area. Your puncture site feels warm to the touch. You have pus or a bad smell  coming from your puncture site. You have a fever. You have chest pain or discomfort that spreads to your neck, jaw, or  arm. You are sweating a lot. You feel nauseous. You have a fast or irregular heartbeat. You have shortness of breath. You are dizzy or light-headed and feel the need to lie down. You have pain or numbness in the arm or leg closest to your puncture site. Get help right away if: Your puncture site suddenly swells. Your puncture site is bleeding and the bleeding does not stop after applying firm pressure to the area. These symptoms may represent a serious problem that is an emergency. Do not wait to see if the symptoms will go away. Get medical help right away. Call your local emergency services (911 in the [place] Do not drive yourself to the hospital. Summary After the procedure, it is normal to have bruising and tenderness at the puncture site in your groin, neck, or forearm. Check your puncture site every day for signs of infection. Get help right away if your puncture site is bleeding and the bleeding does not stop after applying firm pressure to the area. This is a medical emergency.  This information is not intended to replace advice given to you by your health care provider. Make sure you discuss any questions you have with your health care provider.   Discharge instructions   Complete by: As directed    Some studies suggest Prilosec/Omeprazole interacts with Plavix (which you will eventually be placed on). We changed your Prilosec/Omeprazole to the equivalent dose of Protonix for less chance of interaction.   WATCHMAN Procedure, Care After  Procedure MD: Dr. Benson Norway Clinical Coordinator: Lenice Llamas, RN  This sheet gives you information about how to care for yourself after your procedure. Your health care provider may also give you more specific instructions. If you have problems or questions, contact your health care provider.  What can I expect after the procedure? After the procedure, it is common to have: Bruising around your puncture site. Tenderness around your  puncture site. Tiredness (fatigue).  Medication instructions It is very important to continue to take your blood thinner as directed by your doctor after the Watchman procedure. Call your procedure doctor's office with question or concerns. If you are on Coumadin (warfarin), you will have your INR checked the week after your procedure, with a goal INR of 2.0 - 3.0. Please follow your medication instructions on your discharge summary. Only take the medications listed on your discharge paperwork.  Follow up You will be seen in 1 month after your procedure in the [facility] [place] will have another TEE (Transesophageal Echocardiogram) approximately 6 weeks after your procedure mark to check your device You will follow up the MD/APP who performed your procedure 6 months after your procedure The Watchman Clinical Coordinator will check in with you from time to time, including 1 and 2 years after your procedure.    Follow these instructions at home: Puncture site care  Follow instructions from your health care provider about how to take care of your puncture site. Make sure you: If present, leave stitches (sutures), skin glue, or adhesive strips in place.  If a large square bandage is present, this may be removed 24 hours after surgery.  Check your puncture site every day for signs of infection. Check for: Redness, swelling, or pain. Fluid or blood. If your puncture site starts to bleed, lie down on your back, apply firm pressure to the  area, and contact your health care provider. Warmth. Pus or a bad smell. Driving Do not drive yourself home if you received sedation Do not drive for at least 4 days after your procedure or however long your health care provider recommends. (Do not resume driving if you have previously been instructed not to drive for other health reasons.) Do not spend greater than 1 hour at a time in a car for the first 3 days. Stop and take a break with a 5  minute walk at least every hour.  Do not drive or use heavy machinery while taking prescription pain medicine.  Activity Avoid activities that take a lot of effort, including exercise, for at least 7 days after your procedure. For the first 3 days, avoid sitting for longer than one hour at a time.  Avoid alcoholic beverages, signing paperwork, or participating in legal proceedings for 24 hours after receiving sedation Do not lift anything that is heavier than 10 lb (4.5 kg) for one week.  No sexual activity for 1 week.  Return to your normal activities as told by your health care provider. Ask your health care provider what activities are safe for you. General instructions Take over-the-counter and prescription medicines only as told by your health care provider. Do not use any products that contain nicotine or tobacco, such as cigarettes and e-cigarettes. If you need help quitting, ask your health care provider. You may shower after 24 hours, but Do not take baths, swim, or use a hot tub for 1 week.  Do not drink alcohol for 24 hours after your procedure. Keep all follow-up visits as told by your health care provider. This is important. Dental Work: You will require antibiotics prior to any dental work, including cleanings, for 6 months after your Watchman implantation to help protect you from infection. After 6 months, antibiotics are no longer required. Contact a health care provider if: You have redness, mild swelling, or pain around your puncture site. You have soreness in your throat or at your puncture site that does not improve after several days You have fluid or blood coming from your puncture site that stops after applying firm pressure to the area. Your puncture site feels warm to the touch. You have pus or a bad smell coming from your puncture site. You have a fever. You have chest pain or discomfort that spreads to your neck, jaw, or arm. You are sweating a lot. You feel  nauseous. You have a fast or irregular heartbeat. You have shortness of breath. You are dizzy or light-headed and feel the need to lie down. You have pain or numbness in the arm or leg closest to your puncture site. Get help right away if: Your puncture site suddenly swells. Your puncture site is bleeding and the bleeding does not stop after applying firm pressure to the area. These symptoms may represent a serious problem that is an emergency. Do not wait to see if the symptoms will go away. Get medical help right away. Call your local emergency services (911 in the [place] Do not drive yourself to the hospital. Summary After the procedure, it is normal to have bruising and tenderness at the puncture site in your groin, neck, or forearm. Check your puncture site every day for signs of infection. Get help right away if your puncture site is bleeding and the bleeding does not stop after applying firm pressure to the area. This is a medical emergency.  This information is not intended to  replace advice given to you by your health care provider. Make sure you discuss any questions you have with your health care provider.   Increase activity slowly   Complete by: As directed    Increase activity slowly   Complete by: As directed        Follow-up Information     Tommie Raymond, NP Follow up on 09/22/2021.   Specialty: Cardiology Why: at 11:30am. Please arrive to your appointment at 11:15am. Contact information: [address] [place] [address] [phone]                 Duration of Discharge Encounter: Greater than 30 minutes including physician time.  Signed, Kathyrn Drown, NP  08/22/2021 10:01 AM

## 2021-08-22 NOTE — Plan of Care (Signed)

## 2021-08-25 ENCOUNTER — Telehealth: Payer: Self-pay

## 2021-08-25 NOTE — Telephone Encounter (Signed)
Attempted to contact the patient for [facility] call regarding d/c 12/16.  Left message to call back.

## 2021-08-26 NOTE — Telephone Encounter (Signed)
°  [facility] Team  Contacted the patient regarding discharge from [facility] on 08/22/2021   The patient understands to follow up with Kathyrn Drown on 09/22/2021 in preparation for TEE on 10/07/2022.  The patient understands discharge instructions? Yes  The patient understands medications and regimen? Yes. She continues her Xarelto as directed.  The patient reports groin sites look healthy with so S/S of infection.  The patient understands to call with any questions or concerns prior to scheduled visit.

## 2021-08-29 ENCOUNTER — Other Ambulatory Visit (HOSPITAL_COMMUNITY): Payer: Self-pay | Admitting: Internal Medicine

## 2021-09-02 ENCOUNTER — Ambulatory Visit (INDEPENDENT_AMBULATORY_CARE_PROVIDER_SITE_OTHER): Payer: Medicare PPO

## 2021-09-02 DIAGNOSIS — I428 Other cardiomyopathies: Secondary | ICD-10-CM

## 2021-09-02 LAB — CUP PACEART REMOTE DEVICE CHECK
Battery Remaining Longevity: 7 mo
Battery Voltage: 2.85 V
Brady Statistic AP VP Percent: 25.77 %
Brady Statistic AP VS Percent: 0.01 %
Brady Statistic AS VP Percent: 74.19 %
Brady Statistic AS VS Percent: 0.03 %
Brady Statistic RA Percent Paced: 25.75 %
Brady Statistic RV Percent Paced: 99.86 %
Date Time Interrogation Session: 20221227033523
HighPow Impedance: 76 Ohm
Implantable Lead Implant Date: 20070821
Implantable Lead Implant Date: 20070821
Implantable Lead Implant Date: 20160211
Implantable Lead Location: 753858
Implantable Lead Location: 753859
Implantable Lead Location: 753860
Implantable Lead Model: 4194
Implantable Lead Model: 5076
Implantable Lead Model: 6935
Implantable Pulse Generator Implant Date: 20160211
Lead Channel Impedance Value: 133 Ohm
Lead Channel Impedance Value: 399 Ohm
Lead Channel Impedance Value: 437 Ohm
Lead Channel Impedance Value: 494 Ohm
Lead Channel Impedance Value: 513 Ohm
Lead Channel Impedance Value: 646 Ohm
Lead Channel Pacing Threshold Amplitude: 0.5 V
Lead Channel Pacing Threshold Amplitude: 1 V
Lead Channel Pacing Threshold Amplitude: 2 V
Lead Channel Pacing Threshold Pulse Width: 0.4 ms
Lead Channel Pacing Threshold Pulse Width: 0.4 ms
Lead Channel Pacing Threshold Pulse Width: 0.4 ms
Lead Channel Sensing Intrinsic Amplitude: 1.625 mV
Lead Channel Sensing Intrinsic Amplitude: 1.625 mV
Lead Channel Sensing Intrinsic Amplitude: 17.625 mV
Lead Channel Sensing Intrinsic Amplitude: 17.625 mV
Lead Channel Setting Pacing Amplitude: 1.5 V
Lead Channel Setting Pacing Amplitude: 1.5 V
Lead Channel Setting Pacing Amplitude: 3 V
Lead Channel Setting Pacing Pulse Width: 0.4 ms
Lead Channel Setting Pacing Pulse Width: 0.4 ms
Lead Channel Setting Sensing Sensitivity: 0.3 mV

## 2021-09-11 NOTE — Progress Notes (Signed)
Remote ICD transmission.   

## 2021-09-19 NOTE — H&P (View-Only) (Signed)
[facility]                                     Cardiology Office Note:    Date:  09/22/2021   ID:  [name], DOB 13-Oct-1955, MRN [id number]  PCP:  Wayna Chalet, NP  [facility] Cardiologist:  None  [facility] Electrophysiologist:  Virl Axe, MD   Referring MD: Wayna Chalet, NP   Chief Complaint  Patient presents with   Follow-up    Pre TEE s/p Watchman    History of Present Illness:    [name] is a 66 y.o. female with a hx of persistent atrial fibrillation with recent failed ablation, breast CA treated with Adriamycin x 4 cycles, non-ischemic cardiomyopathy/chronic systolic CHF with prior EF now at 50-55% s/p BiV Medtronic ICD, HTN, OSA, and CKD Stage IIIb who was referred to Dr. Quentin Ore for atrial fibrillation ablation and Watchman implant.    On initial evaluation 05/06/21 the patient had been seen by Dr. Caryl Comes 04/18/21 at which time the patient was started on amiodarone but failed therapy. She was noted to have a hx of severe bruising and falls therefore she was sent to Dr. Quentin Ore for the evaluation of possible AF ablation with Watchman implant to follow. CT imaging showed anatomy suitable to move forward. Unfortunately the patient presented 06/30/21 for procedure however this was ultimately aborted secondary pericardial fluid observed with ICE after transseptal puncture. Post procedure echo showed  no pericardial effusion, known NICM. She was discharged and seen back with Dr. Quentin Ore to move forward with Watchman implant.  The patient was admitted and underwent left atrial appendage occlusive device placement with 31 mm Watchman using fluoroscopic and transesophageal echo guidance. She was restarted on Xarelto 15mg  monotherapy. A repeat TEE at approximately 45 days will be performed to ensure proper seal of the device. At that time, the patient will transition to Plavix 75mg  PO daily if the 45-day TEE shows no thrombus or  significant leak to complete 6 months of post implant therapy. The patient will then follow with Dr. Quentin Ore in 12 weeks for post closure follow up.   Today [name] is here with her husband. She has been doing well since implant with no complaints today. We discussed TEE procedure at length with all questions answered. She denies chest pain, SOB, LE edema, orthopnea, dizziness, or syncope.   Past Medical History:  Diagnosis Date   AICD (automatic cardioverter/defibrillator) present    Biventricular implantable cardiac defibrillator)    Medtronic   Breast cancer ([place]) 1998   - left   CHF (congestive heart failure) (HCC)    takes Lasix and Aldactone daily   Chronic kidney disease    Complication of anesthesia    n/v, none at last SURGERY-2022.   GERD (gastroesophageal reflux disease)    takes Omeprazole daily   Gout    takes Allopurinol daily   History of bronchitis 2 yrs ago   History of shingles    Hyperlipidemia    takes Simvastatin daily   Insomnia    takes Ambien nightly   Nonischemic cardiomyopathy ([place])    EF 55% echo 2012   NSVT (nonsustained ventricular tachycardia)    Obesity    OSA (obstructive sleep apnea)    Unable to wear CPAP   Pneumonia 2005   Presence of permanent cardiac pacemaker    Shortness of  breath dyspnea    with exertion   sprint Fidelis     Past Surgical History:  Procedure Laterality Date   ABDOMINAL HYSTERECTOMY     APPENDECTOMY  09/07/1976   ATRIAL FIBRILLATION ABLATION N/A 06/30/2021   Procedure: ATRIAL FIBRILLATION ABLATION;  Surgeon: Vickie Epley, MD;  Location: [place] [facility];  Service: Cardiovascular;  Laterality: N/A;   BiV ICD  09/07/2005   Medtronic   BIV ICD GENERTAOR CHANGE OUT N/A 12/16/2011   Procedure: BIV ICD GENERTAOR CHANGE OUT;  Surgeon: [name], MD;  Location: [facility] C[facility];  Service: Cardiovascular;  Laterality: N/A;   CHOLECYSTECTOMY  09/07/2017   COLONOSCOPY     cyst removed from wrist Left     early 70's   ICD LEAD REMOVAL Right 10/18/2014   Procedure: ICD LEAD REMOVAL/EXTRACTION/REIMPLANTATION;  Surgeon: Evans Lance, MD;  Location: [place];  Service: Cardiovascular;  Laterality: Right;   KYPHOPLASTY N/A 06/06/2021   Procedure: KYPHOPLASTY LUMBAR ONE;  Surgeon: Karsten Ro, DO;  Location: [place];  Service: Neurosurgery;  Laterality: N/A;   LEFT ATRIAL APPENDAGE OCCLUSION N/A 08/21/2021   Procedure: LEFT ATRIAL APPENDAGE OCCLUSION;  Surgeon: Vickie Epley, MD;  Location: [place] [facility];  Service: Cardiovascular;  Laterality: N/A;   MASTECTOMY Left 09/07/1996   port a cath placed  09/07/1996   port removed  09/07/1996   RIGHT HEART CATH N/A 06/20/2018   Procedure: RIGHT HEART CATH;  Surgeon: Jolaine Artist, MD;  Location: [place] [facility];  Service: Cardiovascular;  Laterality: N/A;   TEE WITHOUT CARDIOVERSION N/A 08/21/2021   Procedure: TRANSESOPHAGEAL ECHOCARDIOGRAM (TEE);  Surgeon: Vickie Epley, MD;  Location: [place] [facility];  Service: Cardiovascular;  Laterality: N/A;    Current Medications: Current Meds  Medication Sig   alendronate (FOSAMAX) 70 MG tablet Take 70 mg by mouth every Monday.   allopurinol (ZYLOPRIM) 100 MG tablet Take 100 mg by mouth daily in the afternoon.   amiodarone (PACERONE) 200 MG tablet Take 200 mg by mouth daily.   atorvastatin (LIPITOR) 10 MG tablet Take 10 mg by mouth in the morning.   bisoprolol (ZEBETA) 5 MG tablet Take 1 tablet (5 mg total) by mouth daily. (Patient taking differently: Take 10 mg by mouth in the morning.)   buPROPion (WELLBUTRIN SR) 150 MG 12 hr tablet Take 150 mg by mouth 2 (two) times daily.   Cholecalciferol (VITAMIN D3) 5000 units TABS Take 5,000 Units by mouth in the morning.   Cyanocobalamin (VITAMIN B-12) 5000 MCG SUBL Place 5,000 mcg under the tongue in the morning.   empagliflozin (JARDIANCE) 10 MG TABS tablet Take 1 tablet (10 mg total) by mouth daily before breakfast.   metolazone (ZAROXOLYN)  2.5 MG tablet Take 2.5 mg by mouth 2 (two) times a week. Tuesdays and Thursdays.   Multiple Vitamin (MULTIVITAMIN WITH MINERALS) TABS tablet Take 1 tablet by mouth in the morning. One-A-Day for Women 50+   pantoprazole (PROTONIX) 40 MG tablet Take 1 tablet (40 mg total) by mouth daily.   potassium chloride SA (KLOR-CON M) 20 MEQ tablet Take 3 tablets (60 mEq total) by mouth 2 (two) times daily. (Patient taking differently: Take 40 mEq by mouth 3 (three) times daily.)   Rivaroxaban (XARELTO) 15 MG TABS tablet Take 1 tablet (15 mg total) by mouth daily.   spironolactone (ALDACTONE) 25 MG tablet Take 25 mg by mouth daily in the afternoon.   torsemide (DEMADEX) 20 MG tablet TAKE 1  TABLET EVERY DAY (Patient taking differently: Take 40 mg by mouth in the morning and at bedtime.)   venlafaxine XR (EFFEXOR-XR) 150 MG 24 hr capsule Take 150 mg by mouth daily with breakfast.   zolpidem (AMBIEN) 10 MG tablet Take 10 mg by mouth at bedtime.     Allergies:   Buprenorphine hcl, Morphine and related, Percocet [oxycodone-acetaminophen], and Oxycodone   Social History   Socioeconomic History   Marital status: Married    Spouse name: Not on file   Number of children: Not on file   Years of education: Not on file   Highest education level: Not on file  Occupational History   Not on file  Tobacco Use   Smoking status: Never   Smokeless tobacco: Never  Vaping Use   Vaping Use: Never used  Substance and Sexual Activity   Alcohol use: No   Drug use: No   Sexual activity: Yes    Birth control/protection: None  Other Topics Concern   Not on file  Social History Narrative   Not on file   Social Determinants of Health   Financial Resource Strain: Not on file  Food Insecurity: Not on file  Transportation Needs: Not on file  Physical Activity: Not on file  Stress: Not on file  Social Connections: Not on file    Family History: The patient's family history includes Alzheimer's disease in her  father; Diabetes in her mother; Heart disease in her father.  ROS:   Please see the history of present illness.    All other systems reviewed and are negative.  EKGs/Labs/Other Studies Reviewed:    The following studies were reviewed today:  Procedures This Admission:  Transeptal Puncture Intra-procedural TEE which showed no LAA thrombus Left atrial appendage occlusive device placement on 08/21/21 by Dr. Quentin Ore.    This study demonstrated: Successful implantation of a 31 mm Watchman left atrial appendage occlusion device using fluoroscopic and transesophageal echo guidance  EKG:  EKG is  ordered today.  The ekg ordered today demonstrates ventricular paced rhythm, HR 72bm.   Recent Labs: 02/10/2021: Magnesium 2.3 04/03/2021: ALT 15; TSH 5.600 07/16/2021: B Natriuretic Peptide 523.7 07/29/2021: Hemoglobin 11.8; Platelets 334 08/22/2021: BUN 34; Creatinine, Ser 2.27; Potassium 3.1; Sodium 133  Recent Lipid Panel    Component Value Date/Time   CHOL 133 08/01/2014 1057   TRIG 94 08/01/2014 1057   HDL 43 08/01/2014 1057   CHOLHDL 3.1 08/01/2014 1057   VLDL 19 08/01/2014 1057   LDLCALC 71 08/01/2014 1057   LDLDIRECT 147.2 01/15/2009 0941   Physical Exam:    VS:  There were no vitals taken for this visit.    Wt Readings from Last 3 Encounters:  08/21/21 196 lb (88.9 kg)  08/13/21 195 lb 3.2 oz (88.5 kg)  07/29/21 197 lb 9.6 oz (89.6 kg)   General: Well developed, well nourished, NAD Neck: Negative for carotid bruits. No JVD Lungs:Clear to ausculation bilaterally. No wheezes, rales, or rhonchi. Breathing is unlabored. Cardiovascular: RRR with S1 S2. + murmur Extremities: No edema.  Neuro: Alert and oriented. No focal deficits. No facial asymmetry. MAE spontaneously. Psych: Responds to questions appropriately with normal affect.    ASSESSMENT/PLAN:    Atrial fibrillation: Underwent left atrial appendage occlusive device placement with 31 mm Watchman using fluoroscopic and  transesophageal echo guidance. Restarted on home Xarelto 15mg  monotherapy. A repeat TEE scheduled for 10/07/21 will be performed to ensure proper seal of the device. At that time, the patient will transition  to Plavix 75mg  PO daily if the 45-day TEE shows no thrombus or significant leak to complete 6 months of post implant therapy. Concerned about long term use of ASA after Plavix given renal dysfunction. The patient will then follow with Dr. Quentin Ore in 12 weeks for post closure follow up. Obtain CBC, BMET today .   After careful review of history and examination, the risks and benefits of transesophageal echocardiogram have been explained including risks of esophageal damage, perforation (1:10,000 risk), bleeding, pharyngeal hematoma as well as other potential complications associated with conscious sedation including aspiration, arrhythmia, respiratory failure and death. Alternatives to treatment were discussed, questions were answered. Patient is willing to proceed.   Non-ischemic cardiomyopathy/chronic systolic CHF: w/ EF normalization 50-55% s/p BiV Medtronic ICD. Follows with Dr. Tomma Lightning. Haroldine Laws. Appears euvolemic on exam. No recent hospitalizations. Has follow 10/2021 for repeat echo.   HTN: Stable with no changes needed  CKD Stage IIIb: Last creatinine 2.27. Obtain BMET, CBC today   Medication Adjustments/Labs and Tests Ordered: Current medicines are reviewed at length with the patient today.  Concerns regarding medicines are outlined above.  No orders of the defined types were placed in this encounter.  No orders of the defined types were placed in this encounter.   There are no Patient Instructions on file for this visit.   Signed, Kathyrn Drown, NP  09/22/2021 11:28 AM    [facility]

## 2021-09-19 NOTE — Progress Notes (Signed)
[facility]                                     Cardiology Office Note:    Date:  09/22/2021   ID:  [name], DOB 1956/02/14, MRN [id number]  PCP:  Wayna Chalet, NP  [facility] Cardiologist:  None  [facility] Electrophysiologist:  Virl Axe, MD   Referring MD: Wayna Chalet, NP   Chief Complaint  Patient presents with   Follow-up    Pre TEE s/p Watchman    History of Present Illness:    [name] is a 66 y.o. female with a hx of persistent atrial fibrillation with recent failed ablation, breast CA treated with Adriamycin x 4 cycles, non-ischemic cardiomyopathy/chronic systolic CHF with prior EF now at 50-55% s/p BiV Medtronic ICD, HTN, OSA, and CKD Stage IIIb who was referred to Dr. Quentin Ore for atrial fibrillation ablation and Watchman implant.    On initial evaluation 05/06/21 the patient had been seen by Dr. Caryl Comes 04/18/21 at which time the patient was started on amiodarone but failed therapy. She was noted to have a hx of severe bruising and falls therefore she was sent to Dr. Quentin Ore for the evaluation of possible AF ablation with Watchman implant to follow. CT imaging showed anatomy suitable to move forward. Unfortunately the patient presented 06/30/21 for procedure however this was ultimately aborted secondary pericardial fluid observed with ICE after transseptal puncture. Post procedure echo showed  no pericardial effusion, known NICM. She was discharged and seen back with Dr. Quentin Ore to move forward with Watchman implant.  The patient was admitted and underwent left atrial appendage occlusive device placement with 31 mm Watchman using fluoroscopic and transesophageal echo guidance. She was restarted on Xarelto 15mg  monotherapy. A repeat TEE at approximately 45 days will be performed to ensure proper seal of the device. At that time, the patient will transition to Plavix 75mg  PO daily if the 45-day TEE shows no thrombus or  significant leak to complete 6 months of post implant therapy. The patient will then follow with Dr. Quentin Ore in 12 weeks for post closure follow up.   Today [name] is here with her husband. She has been doing well since implant with no complaints today. We discussed TEE procedure at length with all questions answered. She denies chest pain, SOB, LE edema, orthopnea, dizziness, or syncope.   Past Medical History:  Diagnosis Date   AICD (automatic cardioverter/defibrillator) present    Biventricular implantable cardiac defibrillator)    Medtronic   Breast cancer ([place]) 1998   - left   CHF (congestive heart failure) (HCC)    takes Lasix and Aldactone daily   Chronic kidney disease    Complication of anesthesia    n/v, none at last SURGERY-2022.   GERD (gastroesophageal reflux disease)    takes Omeprazole daily   Gout    takes Allopurinol daily   History of bronchitis 2 yrs ago   History of shingles    Hyperlipidemia    takes Simvastatin daily   Insomnia    takes Ambien nightly   Nonischemic cardiomyopathy ([place])    EF 55% echo 2012   NSVT (nonsustained ventricular tachycardia)    Obesity    OSA (obstructive sleep apnea)    Unable to wear CPAP   Pneumonia 2005   Presence of permanent cardiac pacemaker    Shortness of  breath dyspnea    with exertion   sprint Fidelis     Past Surgical History:  Procedure Laterality Date   ABDOMINAL HYSTERECTOMY     APPENDECTOMY  09/07/1976   ATRIAL FIBRILLATION ABLATION N/A 06/30/2021   Procedure: ATRIAL FIBRILLATION ABLATION;  Surgeon: Vickie Epley, MD;  Location: [place] [facility];  Service: Cardiovascular;  Laterality: N/A;   BiV ICD  09/07/2005   Medtronic   BIV ICD GENERTAOR CHANGE OUT N/A 12/16/2011   Procedure: BIV ICD GENERTAOR CHANGE OUT;  Surgeon: [name], MD;  Location: [facility] C[facility];  Service: Cardiovascular;  Laterality: N/A;   CHOLECYSTECTOMY  09/07/2017   COLONOSCOPY     cyst removed from wrist Left     early 70's   ICD LEAD REMOVAL Right 10/18/2014   Procedure: ICD LEAD REMOVAL/EXTRACTION/REIMPLANTATION;  Surgeon: Evans Lance, MD;  Location: [place];  Service: Cardiovascular;  Laterality: Right;   KYPHOPLASTY N/A 06/06/2021   Procedure: KYPHOPLASTY LUMBAR ONE;  Surgeon: Karsten Ro, DO;  Location: [place];  Service: Neurosurgery;  Laterality: N/A;   LEFT ATRIAL APPENDAGE OCCLUSION N/A 08/21/2021   Procedure: LEFT ATRIAL APPENDAGE OCCLUSION;  Surgeon: Vickie Epley, MD;  Location: [place] [facility];  Service: Cardiovascular;  Laterality: N/A;   MASTECTOMY Left 09/07/1996   port a cath placed  09/07/1996   port removed  09/07/1996   RIGHT HEART CATH N/A 06/20/2018   Procedure: RIGHT HEART CATH;  Surgeon: Jolaine Artist, MD;  Location: [place] [facility];  Service: Cardiovascular;  Laterality: N/A;   TEE WITHOUT CARDIOVERSION N/A 08/21/2021   Procedure: TRANSESOPHAGEAL ECHOCARDIOGRAM (TEE);  Surgeon: Vickie Epley, MD;  Location: [place] [facility];  Service: Cardiovascular;  Laterality: N/A;    Current Medications: Current Meds  Medication Sig   alendronate (FOSAMAX) 70 MG tablet Take 70 mg by mouth every Monday.   allopurinol (ZYLOPRIM) 100 MG tablet Take 100 mg by mouth daily in the afternoon.   amiodarone (PACERONE) 200 MG tablet Take 200 mg by mouth daily.   atorvastatin (LIPITOR) 10 MG tablet Take 10 mg by mouth in the morning.   bisoprolol (ZEBETA) 5 MG tablet Take 1 tablet (5 mg total) by mouth daily. (Patient taking differently: Take 10 mg by mouth in the morning.)   buPROPion (WELLBUTRIN SR) 150 MG 12 hr tablet Take 150 mg by mouth 2 (two) times daily.   Cholecalciferol (VITAMIN D3) 5000 units TABS Take 5,000 Units by mouth in the morning.   Cyanocobalamin (VITAMIN B-12) 5000 MCG SUBL Place 5,000 mcg under the tongue in the morning.   empagliflozin (JARDIANCE) 10 MG TABS tablet Take 1 tablet (10 mg total) by mouth daily before breakfast.   metolazone (ZAROXOLYN)  2.5 MG tablet Take 2.5 mg by mouth 2 (two) times a week. Tuesdays and Thursdays.   Multiple Vitamin (MULTIVITAMIN WITH MINERALS) TABS tablet Take 1 tablet by mouth in the morning. One-A-Day for Women 50+   pantoprazole (PROTONIX) 40 MG tablet Take 1 tablet (40 mg total) by mouth daily.   potassium chloride SA (KLOR-CON M) 20 MEQ tablet Take 3 tablets (60 mEq total) by mouth 2 (two) times daily. (Patient taking differently: Take 40 mEq by mouth 3 (three) times daily.)   Rivaroxaban (XARELTO) 15 MG TABS tablet Take 1 tablet (15 mg total) by mouth daily.   spironolactone (ALDACTONE) 25 MG tablet Take 25 mg by mouth daily in the afternoon.   torsemide (DEMADEX) 20 MG tablet TAKE 1  TABLET EVERY DAY (Patient taking differently: Take 40 mg by mouth in the morning and at bedtime.)   venlafaxine XR (EFFEXOR-XR) 150 MG 24 hr capsule Take 150 mg by mouth daily with breakfast.   zolpidem (AMBIEN) 10 MG tablet Take 10 mg by mouth at bedtime.     Allergies:   Buprenorphine hcl, Morphine and related, Percocet [oxycodone-acetaminophen], and Oxycodone   Social History   Socioeconomic History   Marital status: Married    Spouse name: Not on file   Number of children: Not on file   Years of education: Not on file   Highest education level: Not on file  Occupational History   Not on file  Tobacco Use   Smoking status: Never   Smokeless tobacco: Never  Vaping Use   Vaping Use: Never used  Substance and Sexual Activity   Alcohol use: No   Drug use: No   Sexual activity: Yes    Birth control/protection: None  Other Topics Concern   Not on file  Social History Narrative   Not on file   Social Determinants of Health   Financial Resource Strain: Not on file  Food Insecurity: Not on file  Transportation Needs: Not on file  Physical Activity: Not on file  Stress: Not on file  Social Connections: Not on file    Family History: The patient's family history includes Alzheimer's disease in her  father; Diabetes in her mother; Heart disease in her father.  ROS:   Please see the history of present illness.    All other systems reviewed and are negative.  EKGs/Labs/Other Studies Reviewed:    The following studies were reviewed today:  Procedures This Admission:  Transeptal Puncture Intra-procedural TEE which showed no LAA thrombus Left atrial appendage occlusive device placement on 08/21/21 by Dr. Quentin Ore.    This study demonstrated: Successful implantation of a 31 mm Watchman left atrial appendage occlusion device using fluoroscopic and transesophageal echo guidance  EKG:  EKG is  ordered today.  The ekg ordered today demonstrates ventricular paced rhythm, HR 72bm.   Recent Labs: 02/10/2021: Magnesium 2.3 04/03/2021: ALT 15; TSH 5.600 07/16/2021: B Natriuretic Peptide 523.7 07/29/2021: Hemoglobin 11.8; Platelets 334 08/22/2021: BUN 34; Creatinine, Ser 2.27; Potassium 3.1; Sodium 133  Recent Lipid Panel    Component Value Date/Time   CHOL 133 08/01/2014 1057   TRIG 94 08/01/2014 1057   HDL 43 08/01/2014 1057   CHOLHDL 3.1 08/01/2014 1057   VLDL 19 08/01/2014 1057   LDLCALC 71 08/01/2014 1057   LDLDIRECT 147.2 01/15/2009 0941   Physical Exam:    VS:  There were no vitals taken for this visit.    Wt Readings from Last 3 Encounters:  08/21/21 196 lb (88.9 kg)  08/13/21 195 lb 3.2 oz (88.5 kg)  07/29/21 197 lb 9.6 oz (89.6 kg)   General: Well developed, well nourished, NAD Neck: Negative for carotid bruits. No JVD Lungs:Clear to ausculation bilaterally. No wheezes, rales, or rhonchi. Breathing is unlabored. Cardiovascular: RRR with S1 S2. + murmur Extremities: No edema.  Neuro: Alert and oriented. No focal deficits. No facial asymmetry. MAE spontaneously. Psych: Responds to questions appropriately with normal affect.    ASSESSMENT/PLAN:    Atrial fibrillation: Underwent left atrial appendage occlusive device placement with 31 mm Watchman using fluoroscopic and  transesophageal echo guidance. Restarted on home Xarelto 15mg  monotherapy. A repeat TEE scheduled for 10/07/21 will be performed to ensure proper seal of the device. At that time, the patient will transition  to Plavix 75mg  PO daily if the 45-day TEE shows no thrombus or significant leak to complete 6 months of post implant therapy. Concerned about long term use of ASA after Plavix given renal dysfunction. The patient will then follow with Dr. Quentin Ore in 12 weeks for post closure follow up. Obtain CBC, BMET today .   After careful review of history and examination, the risks and benefits of transesophageal echocardiogram have been explained including risks of esophageal damage, perforation (1:10,000 risk), bleeding, pharyngeal hematoma as well as other potential complications associated with conscious sedation including aspiration, arrhythmia, respiratory failure and death. Alternatives to treatment were discussed, questions were answered. Patient is willing to proceed.   Non-ischemic cardiomyopathy/chronic systolic CHF: w/ EF normalization 50-55% s/p BiV Medtronic ICD. Follows with Dr. Tomma Lightning. Haroldine Laws. Appears euvolemic on exam. No recent hospitalizations. Has follow 10/2021 for repeat echo.   HTN: Stable with no changes needed  CKD Stage IIIb: Last creatinine 2.27. Obtain BMET, CBC today   Medication Adjustments/Labs and Tests Ordered: Current medicines are reviewed at length with the patient today.  Concerns regarding medicines are outlined above.  No orders of the defined types were placed in this encounter.  No orders of the defined types were placed in this encounter.   There are no Patient Instructions on file for this visit.   Signed, Kathyrn Drown, NP  09/22/2021 11:28 AM    [facility]

## 2021-09-22 ENCOUNTER — Encounter: Payer: Self-pay | Admitting: *Deleted

## 2021-09-22 ENCOUNTER — Other Ambulatory Visit: Payer: Self-pay

## 2021-09-22 ENCOUNTER — Encounter: Payer: Self-pay | Admitting: Cardiology

## 2021-09-22 ENCOUNTER — Ambulatory Visit: Payer: Medicare PPO | Admitting: Cardiology

## 2021-09-22 VITALS — BP 116/64 | HR 72 | Ht 62.0 in | Wt 190.8 lb

## 2021-09-22 DIAGNOSIS — Z01818 Encounter for other preprocedural examination: Secondary | ICD-10-CM

## 2021-09-22 DIAGNOSIS — Z95818 Presence of other cardiac implants and grafts: Secondary | ICD-10-CM

## 2021-09-22 DIAGNOSIS — Z95810 Presence of automatic (implantable) cardiac defibrillator: Secondary | ICD-10-CM

## 2021-09-22 DIAGNOSIS — I428 Other cardiomyopathies: Secondary | ICD-10-CM

## 2021-09-22 DIAGNOSIS — I4819 Other persistent atrial fibrillation: Secondary | ICD-10-CM

## 2021-09-22 DIAGNOSIS — I10 Essential (primary) hypertension: Secondary | ICD-10-CM

## 2021-09-22 DIAGNOSIS — N1832 Chronic kidney disease, stage 3b: Secondary | ICD-10-CM

## 2021-09-22 LAB — CBC
Hematocrit: 38.4 % (ref 34.0–46.6)
Hemoglobin: 12.2 g/dL (ref 11.1–15.9)
MCH: 29.1 pg (ref 26.6–33.0)
MCHC: 31.8 g/dL (ref 31.5–35.7)
MCV: 92 fL (ref 79–97)
Platelets: 356 10*3/uL (ref 150–450)
RBC: 4.19 x10E6/uL (ref 3.77–5.28)
RDW: 15.2 % (ref 11.7–15.4)
WBC: 9.3 10*3/uL (ref 3.4–10.8)

## 2021-09-22 LAB — BASIC METABOLIC PANEL
BUN/Creatinine Ratio: 16 (ref 12–28)
BUN: 46 mg/dL — ABNORMAL HIGH (ref 8–27)
CO2: 28 mmol/L (ref 20–29)
Calcium: 10.8 mg/dL — ABNORMAL HIGH (ref 8.7–10.3)
Chloride: 89 mmol/L — ABNORMAL LOW (ref 96–106)
Creatinine, Ser: 2.81 mg/dL — ABNORMAL HIGH (ref 0.57–1.00)
Glucose: 138 mg/dL — ABNORMAL HIGH (ref 70–99)
Potassium: 3.3 mmol/L — ABNORMAL LOW (ref 3.5–5.2)
Sodium: 138 mmol/L (ref 134–144)
eGFR: 18 mL/min/{1.73_m2} — ABNORMAL LOW (ref >59)

## 2021-09-22 NOTE — Patient Instructions (Signed)
Medication Instructions:   Your physician recommends that you continue on your current medications as directed. Please refer to the Current Medication list given to you today.  *If you need a refill on your cardiac medications before your next appointment, please call your pharmacy*   Lab Work:  [place]   If you have labs (blood work) drawn today and your tests are completely normal, you will receive your results only by: [place] (if you have MyChart) OR A paper copy in the mail If you have any lab test that is abnormal or we need to change your treatment, we will call you to review the results.   Testing/Procedures: Your physician has requested that you have a TEE. During a TEE, sound waves are used to create images of your heart. It provides your doctor with information about the size and shape of your heart and how well your hearts chambers and valves are working. In this test, a transducer is attached to the end of a flexible tube thats guided down your throat and into your esophagus (the tube leading from you mouth to your stomach) to get a more detailed image of your heart. You are not awake for the procedure. Please see the instruction sheet given to you today. For further information please visit [email or web address].      Follow-Up: At [facility], you and your health needs are our priority.  As part of our continuing mission to provide you with exceptional heart care, we have created designated Provider Care Teams.  These Care Teams include your primary Cardiologist (physician) and Advanced Practice Providers (APPs -  Physician Assistants and Nurse Practitioners) who all work together to provide you with the care you need, when you need it.  We recommend signing up for the patient portal called "MyChart".  Sign up information is provided on this After Visit Summary.  MyChart is used to connect with patients for Virtual Visits (Telemedicine).  Patients are able  to view lab/test results, encounter notes, upcoming appointments, etc.  Non-urgent messages can be sent to your provider as well.   To learn more about what you can do with MyChart, go to [email or web address].    Your next appointment:   6 month(s)  The format for your next appointment:   In Person  Provider:   Lars Mage, MD  Other Instructions

## 2021-09-23 ENCOUNTER — Telehealth: Payer: Self-pay | Admitting: Cardiology

## 2021-09-23 NOTE — Telephone Encounter (Signed)
Called the patient in regards to pre TEE labs performed 09/22/21. Na+ chronically low at 3.3 and creatinine elevated above baseline. Creatinine yesterday noted to be 2.81. She is on high dose K+ supplementation along with Spironolactone. She is also on metolazone 2.5mg  twice weekly and torsemide 40mg  BID. I have asked that she reduce her torsemide to 20mg  BID for the next three days. She will monitor her weights and I will call her Friday to discuss plans. She has follow up with Dr. Haroldine Laws 10/16/21 and will see a nephrologist the end of February. I recommend that she keep these appointments.    Kathyrn Drown NP-C Structural Heart Team  Pager: [phone] Phone: [phone]

## 2021-09-24 ENCOUNTER — Other Ambulatory Visit: Payer: Self-pay | Admitting: Internal Medicine

## 2021-09-24 ENCOUNTER — Ambulatory Visit: Payer: Medicare PPO | Admitting: Cardiology

## 2021-09-25 ENCOUNTER — Encounter (HOSPITAL_COMMUNITY): Payer: Self-pay | Admitting: Cardiology

## 2021-09-30 ENCOUNTER — Other Ambulatory Visit: Payer: Self-pay | Admitting: Nephrology

## 2021-09-30 ENCOUNTER — Other Ambulatory Visit (HOSPITAL_COMMUNITY): Payer: Self-pay | Admitting: Nephrology

## 2021-09-30 DIAGNOSIS — I129 Hypertensive chronic kidney disease with stage 1 through stage 4 chronic kidney disease, or unspecified chronic kidney disease: Secondary | ICD-10-CM

## 2021-09-30 DIAGNOSIS — I480 Paroxysmal atrial fibrillation: Secondary | ICD-10-CM

## 2021-09-30 DIAGNOSIS — N170 Acute kidney failure with tubular necrosis: Secondary | ICD-10-CM

## 2021-09-30 DIAGNOSIS — N182 Chronic kidney disease, stage 2 (mild): Secondary | ICD-10-CM

## 2021-09-30 DIAGNOSIS — I5022 Chronic systolic (congestive) heart failure: Secondary | ICD-10-CM

## 2021-10-07 ENCOUNTER — Ambulatory Visit (HOSPITAL_COMMUNITY): Payer: Medicare PPO | Admitting: Certified Registered"

## 2021-10-07 ENCOUNTER — Encounter (HOSPITAL_COMMUNITY)
Admission: RE | Disposition: A | Discharge status: Home / Self Care | Payer: Medicare PPO | Source: Home / Self Care | Attending: Cardiovascular Disease

## 2021-10-07 ENCOUNTER — Telehealth: Payer: Self-pay

## 2021-10-07 ENCOUNTER — Other Ambulatory Visit: Payer: Self-pay

## 2021-10-07 ENCOUNTER — Encounter (HOSPITAL_COMMUNITY): Payer: Self-pay | Admitting: Cardiovascular Disease

## 2021-10-07 ENCOUNTER — Ambulatory Visit (HOSPITAL_COMMUNITY)
Admission: RE | Admit: 2021-10-07 | Discharge: 2021-10-07 | Disposition: A | Discharge status: Home / Self Care | Payer: Medicare PPO | Attending: Cardiovascular Disease | Admitting: Cardiovascular Disease

## 2021-10-07 ENCOUNTER — Ambulatory Visit (HOSPITAL_COMMUNITY): Payer: Medicare PPO

## 2021-10-07 ENCOUNTER — Ambulatory Visit (HOSPITAL_BASED_OUTPATIENT_CLINIC_OR_DEPARTMENT_OTHER): Payer: Medicare PPO

## 2021-10-07 ENCOUNTER — Encounter (HOSPITAL_COMMUNITY): Payer: Self-pay

## 2021-10-07 DIAGNOSIS — I081 Rheumatic disorders of both mitral and tricuspid valves: Secondary | ICD-10-CM | POA: Insufficient documentation

## 2021-10-07 DIAGNOSIS — E785 Hyperlipidemia, unspecified: Secondary | ICD-10-CM | POA: Insufficient documentation

## 2021-10-07 DIAGNOSIS — G4733 Obstructive sleep apnea (adult) (pediatric): Secondary | ICD-10-CM

## 2021-10-07 DIAGNOSIS — X58XXXA Exposure to other specified factors, initial encounter: Secondary | ICD-10-CM | POA: Insufficient documentation

## 2021-10-07 DIAGNOSIS — I5022 Chronic systolic (congestive) heart failure: Secondary | ICD-10-CM | POA: Diagnosis not present

## 2021-10-07 DIAGNOSIS — Z7901 Long term (current) use of anticoagulants: Secondary | ICD-10-CM | POA: Insufficient documentation

## 2021-10-07 DIAGNOSIS — I361 Nonrheumatic tricuspid (valve) insufficiency: Secondary | ICD-10-CM | POA: Diagnosis not present

## 2021-10-07 DIAGNOSIS — Z95810 Presence of automatic (implantable) cardiac defibrillator: Secondary | ICD-10-CM

## 2021-10-07 DIAGNOSIS — Q2111 Secundum atrial septal defect: Secondary | ICD-10-CM | POA: Diagnosis not present

## 2021-10-07 DIAGNOSIS — I428 Other cardiomyopathies: Secondary | ICD-10-CM | POA: Insufficient documentation

## 2021-10-07 DIAGNOSIS — I4819 Other persistent atrial fibrillation: Secondary | ICD-10-CM | POA: Insufficient documentation

## 2021-10-07 DIAGNOSIS — T148XXA Other injury of unspecified body region, initial encounter: Secondary | ICD-10-CM

## 2021-10-07 DIAGNOSIS — N1832 Chronic kidney disease, stage 3b: Secondary | ICD-10-CM | POA: Diagnosis not present

## 2021-10-07 DIAGNOSIS — G4730 Sleep apnea, unspecified: Secondary | ICD-10-CM | POA: Insufficient documentation

## 2021-10-07 DIAGNOSIS — Z95818 Presence of other cardiac implants and grafts: Secondary | ICD-10-CM | POA: Insufficient documentation

## 2021-10-07 DIAGNOSIS — I340 Nonrheumatic mitral (valve) insufficiency: Secondary | ICD-10-CM

## 2021-10-07 DIAGNOSIS — I480 Paroxysmal atrial fibrillation: Secondary | ICD-10-CM

## 2021-10-07 DIAGNOSIS — I130 Hypertensive heart and chronic kidney disease with heart failure and stage 1 through stage 4 chronic kidney disease, or unspecified chronic kidney disease: Secondary | ICD-10-CM | POA: Insufficient documentation

## 2021-10-07 HISTORY — PX: TEE WITHOUT CARDIOVERSION: SHX5443

## 2021-10-07 LAB — POCT I-STAT, CHEM 8
BUN: 22 mg/dL (ref 8–23)
Calcium, Ion: 1.25 mmol/L (ref 1.15–1.40)
Chloride: 97 mmol/L — ABNORMAL LOW (ref 98–111)
Creatinine, Ser: 1.9 mg/dL — ABNORMAL HIGH (ref 0.44–1.00)
Glucose, Bld: 89 mg/dL (ref 70–99)
HCT: 35 % — ABNORMAL LOW (ref 36.0–46.0)
Hemoglobin: 11.9 g/dL — ABNORMAL LOW (ref 12.0–15.0)
Potassium: 3.2 mmol/L — ABNORMAL LOW (ref 3.5–5.1)
Sodium: 139 mmol/L (ref 135–145)
TCO2: 28 mmol/L (ref 22–32)

## 2021-10-07 SURGERY — ECHOCARDIOGRAM, TRANSESOPHAGEAL
Anesthesia: Monitor Anesthesia Care

## 2021-10-07 MED ORDER — PROPOFOL 500 MG/50ML IV EMUL
INTRAVENOUS | Status: DC | PRN
  Administered 2021-10-07: 125 ug/kg/min via INTRAVENOUS

## 2021-10-07 MED ORDER — PROPOFOL 10 MG/ML IV BOLUS
INTRAVENOUS | Status: DC | PRN
Start: 2021-10-07 — End: 2021-10-07
  Administered 2021-10-07: 30 mg via INTRAVENOUS

## 2021-10-07 MED ORDER — LIDOCAINE HCL (CARDIAC) PF 100 MG/5ML IV SOSY
PREFILLED_SYRINGE | INTRAVENOUS | Status: DC | PRN
  Administered 2021-10-07: 60 mg via INTRAVENOUS

## 2021-10-07 MED ORDER — SODIUM CHLORIDE 0.9 % IV SOLN: INTRAVENOUS | Status: DC

## 2021-10-07 MED ORDER — BUTAMBEN-TETRACAINE-BENZOCAINE 2-2-14 % EX AERO
INHALATION_SPRAY | CUTANEOUS | Status: DC | PRN
  Administered 2021-10-07: 2 via TOPICAL

## 2021-10-07 MED ORDER — CLOPIDOGREL BISULFATE 75 MG PO TABS: 75.0000 mg | ORAL_TABLET | Freq: Every day | ORAL | 1 refills | Status: AC

## 2021-10-07 NOTE — Transfer of Care (Signed)
Immediate Anesthesia Transfer of Care Note  Patient: [name]  Procedure(s) Performed: TRANSESOPHAGEAL ECHOCARDIOGRAM (TEE)  Patient Location: PACU  Anesthesia Type:MAC  Level of Consciousness: drowsy  Airway & Oxygen Therapy: Patient Spontanous Breathing and Patient connected to face mask oxygen  Post-op Assessment: Report given to RN and Post -op Vital signs reviewed and stable  Post vital signs: Reviewed and stable  Last Vitals:  Vitals Value Taken Time  BP 104/61 10/07/21 1229  Temp    Pulse 64 10/07/21 1230  Resp 18 10/07/21 1230  SpO2 100 % 10/07/21 1230  Vitals shown include unvalidated device data.  Last Pain:  Vitals:   10/07/21 1115  TempSrc: Oral  PainSc: 0-No pain         Complications: No notable events documented.

## 2021-10-07 NOTE — Telephone Encounter (Signed)
Per [name]'s 09/22/2021 office note,  "A repeat TEE scheduled for 10/07/21 will be performed to ensure proper seal of the device. At that time, the patient will transition to Plavix 75mg  PO daily if the 45-day TEE shows no thrombus or significant leak to complete 6 months of post implant therapy. Concerned about long term use of ASA after Plavix given renal dysfunction. The patient will then follow with Dr. Quentin Ore in 12 weeks for post closure follow up. Obtain CBC, BMET today."   Per Dr. Victorino December TEE report, "Well-seated Watchman device without leak or thrombus. Left atrial size was mildly dilated. No left atrial/left atrial appendage thrombus was detected."  Per Watchman protocol, instructed patient to STOP XARELTO and START PLAVIX 75 mg daily until instructed to stop.  Scheduled the patient for 6 month visit with Dr. Quentin Ore 02/19/2022. Per patient request, she will have MyChart visit to avoid driving from [place], [place]. The patient was grateful for call and agrees with plan.  Video visit consent obtained.    Patient Consent for Virtual Visit        [name] has provided verbal consent on 10/07/2021 for a virtual visit (video or telephone).   CONSENT FOR VIRTUAL VISIT FOR:  [name]  By participating in this virtual visit I agree to the following:  I hereby voluntarily request, consent and authorize [facility] and its employed or contracted physicians, physician assistants, nurse practitioners or other licensed health care professionals (the Practitioner), to provide me with telemedicine health care services (the Services") as deemed necessary by the treating Practitioner. I acknowledge and consent to receive the Services by the Practitioner via telemedicine. I understand that the telemedicine visit will involve communicating with the Practitioner through live audiovisual communication technology and the disclosure of certain medical information by electronic  transmission. I acknowledge that I have been given the opportunity to request an in-person assessment or other available alternative prior to the telemedicine visit and am voluntarily participating in the telemedicine visit.  I understand that I have the right to withhold or withdraw my consent to the use of telemedicine in the course of my care at any time, without affecting my right to future care or treatment, and that the Practitioner or I may terminate the telemedicine visit at any time. I understand that I have the right to inspect all information obtained and/or recorded in the course of the telemedicine visit and may receive copies of available information for a reasonable fee.  I understand that some of the potential risks of receiving the Services via telemedicine include:  Delay or interruption in medical evaluation due to technological equipment failure or disruption; Information transmitted may not be sufficient (e.g. poor resolution of images) to allow for appropriate medical decision making by the Practitioner; and/or  In rare instances, security protocols could fail, causing a breach of personal health information.  Furthermore, I acknowledge that it is my responsibility to provide information about my medical history, conditions and care that is complete and accurate to the best of my ability. I acknowledge that Practitioner's advice, recommendations, and/or decision may be based on factors not within their control, such as incomplete or inaccurate data provided by me or distortions of diagnostic images or specimens that may result from electronic transmissions. I understand that the practice of medicine is not an exact science and that Practitioner makes no warranties or guarantees regarding treatment outcomes. I acknowledge that a copy of this consent can be made available to me  via my patient portal ([facility]), or I can request a printed copy by calling the office of [place].    I understand that my insurance will be billed for this visit.   I have read or had this consent read to me. I understand the contents of this consent, which adequately explains the benefits and risks of the Services being provided via telemedicine.  I have been provided ample opportunity to ask questions regarding this consent and the Services and have had my questions answered to my satisfaction. I give my informed consent for the services to be provided through the use of telemedicine in my medical care

## 2021-10-07 NOTE — Anesthesia Preprocedure Evaluation (Addendum)
Anesthesia Evaluation  Patient identified by MRN, date of birth, ID band Patient awake    Reviewed: Allergy & Precautions, NPO status , Patient's Chart, lab work & pertinent test results  History of Anesthesia Complications Negative for: history of anesthetic complications  Airway Mallampati: II  TM Distance: >3 FB Neck ROM: Full    Dental  (+) Dental Advisory Given   Pulmonary sleep apnea (does not use CPAP) ,    breath sounds clear to auscultation       Cardiovascular hypertension, Pt. 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, Trivial MR, mod-severe TR   Neuro/Psych negative neurological ROS     GI/Hepatic Neg liver ROS, GERD  Medicated and Controlled,  Endo/Other  Morbid obesity  Renal/GU Renal InsufficiencyRenal disease     Musculoskeletal   Abdominal (+) + obese,   Peds  Hematology Xarelto   Anesthesia Other Findings H/o breast cancer  Reproductive/Obstetrics                            Anesthesia Physical Anesthesia Plan  ASA: 4  Anesthesia Plan: MAC   Post-op Pain Management: Minimal or no pain anticipated   Induction:   PONV Risk Score and Plan: 2 and Treatment may vary due to age or medical condition and Ondansetron  Airway Management Planned: Natural Airway and Nasal Cannula  Additional Equipment: None  Intra-op Plan:   Post-operative Plan:   Informed Consent: I have reviewed the patients History and Physical, chart, labs and discussed the procedure including the risks, benefits and alternatives for the proposed anesthesia with the patient or authorized representative who has indicated his/her understanding and acceptance.      Dental advisory given  Plan Discussed with: CRNA and Surgeon  Anesthesia Plan Comments:        Anesthesia Quick Evaluation

## 2021-10-07 NOTE — Op Note (Signed)
INDICATIONS: Watchman device f/u  PROCEDURE:   Informed consent was obtained prior to the procedure. The risks, benefits and alternatives for the procedure were discussed and the patient comprehended these risks.  Risks include, but are not limited to, cough, sore throat, vomiting, nausea, somnolence, esophageal and stomach trauma or perforation, bleeding, low blood pressure, aspiration, pneumonia, infection, trauma to the teeth and death.    After a procedural time-out, the oropharynx was anesthetized with 20% benzocaine spray.   During this procedure the patient was administered IV propofol by Anesthesiology  The transesophageal probe was inserted in the esophagus and stomach without difficulty and multiple views were obtained.  The patient was kept under observation until the patient left the procedure room.  The patient left the procedure room in stable condition.   Agitated microbubble saline contrast was not administered.  COMPLICATIONS:    There were no immediate complications.  FINDINGS:  Well seated Watchman device without leak. Severe TR, question flail septal leaflet (and segment of the anterior tricuspid leaflet?). Multiple leads in right heart chambers. Dilated RV with depressed systolic function. Tiny iatrogenic ASD with bidirectional shunt.  RECOMMENDATIONS:    Discontinue anticoagulation per Watchman protocol.  Time Spent Directly with the Patient:  30 minutes   Corina Stacy 10/07/2021, 12:23 PM

## 2021-10-07 NOTE — Anesthesia Postprocedure Evaluation (Signed)
Anesthesia Post Note  Patient: [name]  Procedure(s) Performed: TRANSESOPHAGEAL ECHOCARDIOGRAM (TEE)     Patient location during evaluation: Endoscopy Anesthesia Type: MAC Level of consciousness: awake and alert, patient cooperative and oriented Pain management: pain level controlled Vital Signs Assessment: post-procedure vital signs reviewed and stable Respiratory status: nonlabored ventilation, spontaneous breathing and respiratory function stable Cardiovascular status: blood pressure returned to baseline and stable Postop Assessment: no apparent nausea or vomiting Anesthetic complications: no   No notable events documented.  Last Vitals:  Vitals:   10/07/21 1229 10/07/21 1245  BP: 104/61 98/69  Pulse: 64 65  Resp: 18 16  Temp: (!) 36.3 C   SpO2: 100% 97%    Last Pain:  Vitals:   10/07/21 1245  TempSrc:   PainSc: 0-No pain                 Daryan Buell,E. Gerald Kuehl

## 2021-10-07 NOTE — Interval H&P Note (Signed)
History and Physical Interval Note:  10/07/2021 10:36 AM  [name]  has presented today for surgery, with the diagnosis of POST WATCHMAN EVALUATION.  The various methods of treatment have been discussed with the patient and family. After consideration of risks, benefits and other options for treatment, the patient has consented to  Procedure(s): TRANSESOPHAGEAL ECHOCARDIOGRAM (TEE) (N/A) as a surgical intervention.  The patient's history has been reviewed, patient examined, no change in status, stable for surgery.  I have reviewed the patient's chart and labs.  Questions were answered to the patient's satisfaction.     Rika Daughdrill

## 2021-10-08 ENCOUNTER — Encounter (HOSPITAL_COMMUNITY): Payer: Self-pay | Admitting: Cardiovascular Disease

## 2021-10-08 ENCOUNTER — Encounter (HOSPITAL_COMMUNITY): Payer: Self-pay

## 2021-10-08 ENCOUNTER — Other Ambulatory Visit (HOSPITAL_COMMUNITY): Payer: Medicare PPO

## 2021-10-16 ENCOUNTER — Other Ambulatory Visit: Payer: Self-pay

## 2021-10-16 ENCOUNTER — Other Ambulatory Visit (HOSPITAL_COMMUNITY): Payer: Medicare PPO

## 2021-10-16 ENCOUNTER — Ambulatory Visit (INDEPENDENT_AMBULATORY_CARE_PROVIDER_SITE_OTHER): Payer: Medicare PPO

## 2021-10-16 ENCOUNTER — Ambulatory Visit (HOSPITAL_COMMUNITY)
Admission: RE | Admit: 2021-10-16 | Discharge: 2021-10-16 | Disposition: A | Discharge status: Home / Self Care | Payer: Medicare PPO | Source: Ambulatory Visit | Attending: Internal Medicine | Admitting: Internal Medicine

## 2021-10-16 ENCOUNTER — Encounter (HOSPITAL_COMMUNITY): Payer: Self-pay | Admitting: Internal Medicine

## 2021-10-16 VITALS — BP 104/78 | HR 74 | Wt 198.6 lb

## 2021-10-16 DIAGNOSIS — E669 Obesity, unspecified: Secondary | ICD-10-CM | POA: Diagnosis not present

## 2021-10-16 DIAGNOSIS — R5383 Other fatigue: Secondary | ICD-10-CM | POA: Insufficient documentation

## 2021-10-16 DIAGNOSIS — Z95818 Presence of other cardiac implants and grafts: Secondary | ICD-10-CM | POA: Diagnosis not present

## 2021-10-16 DIAGNOSIS — I428 Other cardiomyopathies: Secondary | ICD-10-CM | POA: Diagnosis not present

## 2021-10-16 DIAGNOSIS — I480 Paroxysmal atrial fibrillation: Secondary | ICD-10-CM | POA: Insufficient documentation

## 2021-10-16 DIAGNOSIS — Z7901 Long term (current) use of anticoagulants: Secondary | ICD-10-CM | POA: Diagnosis not present

## 2021-10-16 DIAGNOSIS — G4733 Obstructive sleep apnea (adult) (pediatric): Secondary | ICD-10-CM | POA: Insufficient documentation

## 2021-10-16 DIAGNOSIS — I4819 Other persistent atrial fibrillation: Secondary | ICD-10-CM

## 2021-10-16 DIAGNOSIS — N184 Chronic kidney disease, stage 4 (severe): Secondary | ICD-10-CM | POA: Insufficient documentation

## 2021-10-16 DIAGNOSIS — R5381 Other malaise: Secondary | ICD-10-CM | POA: Diagnosis not present

## 2021-10-16 DIAGNOSIS — Z6836 Body mass index (BMI) 36.0-36.9, adult: Secondary | ICD-10-CM | POA: Diagnosis not present

## 2021-10-16 DIAGNOSIS — Z79899 Other long term (current) drug therapy: Secondary | ICD-10-CM | POA: Insufficient documentation

## 2021-10-16 DIAGNOSIS — Z7984 Long term (current) use of oral hypoglycemic drugs: Secondary | ICD-10-CM | POA: Insufficient documentation

## 2021-10-16 DIAGNOSIS — I5022 Chronic systolic (congestive) heart failure: Secondary | ICD-10-CM | POA: Diagnosis not present

## 2021-10-16 DIAGNOSIS — I130 Hypertensive heart and chronic kidney disease with heart failure and stage 1 through stage 4 chronic kidney disease, or unspecified chronic kidney disease: Secondary | ICD-10-CM | POA: Diagnosis not present

## 2021-10-16 LAB — BASIC METABOLIC PANEL
Anion gap: 10 (ref 5–15)
BUN: 22 mg/dL (ref 8–23)
CO2: 23 mmol/L (ref 22–32)
Calcium: 9.2 mg/dL (ref 8.9–10.3)
Chloride: 105 mmol/L (ref 98–111)
Creatinine, Ser: 1.83 mg/dL — ABNORMAL HIGH (ref 0.44–1.00)
GFR, Estimated: 30 mL/min — ABNORMAL LOW (ref >60)
Glucose, Bld: 116 mg/dL — ABNORMAL HIGH (ref 70–99)
Potassium: 4 mmol/L (ref 3.5–5.1)
Sodium: 138 mmol/L (ref 135–145)

## 2021-10-16 MED ORDER — TORSEMIDE 20 MG PO TABS: 40.0000 mg | ORAL_TABLET | Freq: Two times a day (BID) | ORAL | 3 refills | Status: AC

## 2021-10-16 NOTE — Patient Instructions (Signed)
Medication Changes:  Increase Torsemide to 40 mg Twice daily   Lab Work:  Labs done today, your results will be available in MyChart, we will contact you for abnormal readings.   Testing/Procedures:  none  Referrals:  none  Special Instructions // Education:  none  Follow-Up in: 2 weeks  At the [facility], you and your health needs are our priority. We have a designated team specialized in the treatment of Heart Failure. This Care Team includes your primary Heart Failure Specialized Cardiologist (physician), Advanced Practice Providers (APPs- Physician Assistants and Nurse Practitioners), and Pharmacist who all work together to provide you with the care you need, when you need it.   You may see any of the following providers on your designated Care Team at your next follow up:  Dr Glori Bickers Dr Haynes Kerns, NP Lyda Jester, [facility] Ali Molina, [place] Audry Riles, PharmD   Please be sure to bring in all your medications bottles to every appointment.   Need to Contact us:  If you have any questions or concerns before your next appointment please send [place] a message through [place] or call our office at [phone].    TO LEAVE A MESSAGE FOR THE NURSE SELECT OPTION 2, PLEASE LEAVE A MESSAGE INCLUDING: YOUR NAME DATE OF BIRTH CALL BACK NUMBER REASON FOR CALL**this is important as we prioritize the call backs  YOU WILL RECEIVE A CALL BACK THE SAME DAY AS LONG AS YOU CALL BEFORE 4:00 PM

## 2021-10-16 NOTE — Progress Notes (Signed)
ADVANCED HF CLINIC NOTE  PCP: [facility] at [facility] Cardiologist: Dr. Fransico Him HF cardiologist: Dr. Haroldine Laws  HPI: [name] is a 66 y.o. woman with history of breast CA in 1998 treated with Adriamycin x 4 cycles. In 2007, diagnosed with CHF with EF 25-35%. She is s/p Medtronic BiV-ICD in 2008 with Sprint Fidelis lead which was subsequently replaced. LV function has subsequently recovered  Had cath 12/10. Coronaries normal. Right atrial pressure mean of 7, RV pressure 28/6 . Pulmonary artery pressure was 23/9 with mean of 15.  Pulmonary capillary wedge pressure mean of 9. Fick cardiac output 3.9 L per minute.  Cardiac index 2.2 L per minute per meter squared.  Pulmonary vascular resistance was 1.5 [place] units.  [place] 2019  RA = 11 RV = 27/5 PA = 24/3 (17) PCW = 9 Fick cardiac output/index = 4.8/2.5 PVR = 1.8 WU Ao sat = 98% PA sat = 66%, 67%  Echo 2/21 EF 50%-55% mild to moderate TR. Personally reviewed  In 9/21 admitted to [place] with Covid (despite vaccination/boosting) was there for 6 days and got better. In 12/21 started having falls. SBP in 50s. Found to be septic with AKI.  BCx negative. Stopped all HF meds and diuretics. Gained 10 pounds.  Echo done EF 35-40%  Seen for HF follow 09/13/20. Volume overloaded by ICD interrogation. Torsemide was increased to 60 mg bid, losartan and carvedilol restarted.   Has had more AF recently on device and started on Eliquis by Dr. Caryl Comes. Had inappropriate ICD shocks for AF. Eliquis stopped and Xarelto started due to increased bruising.  Echo 12/12/20 EF 40-45% with dyssynchrony. RV ok   Admitted 10/22 for afib ablation but procedure aborted 2/2 pericardial fluid observed after transseptal puncture. Effusion stable on follow up echo, however EF down 25-30%.  12/22 underwent Watchman implant by Dr. Quentin Ore. Intraoperative TEE showed normal EF 50-55%, RV mildly reduced.   Had post procedure f/u w/ cardiology on 1/16  and labs showed AKI from prior baseline of 2.3>>2.8. Instructed to reduce torsemide from 40 mg bid to 20 mg bid. Was also set up for 1 month post implant TEE which showed well-seated Watchman device without leak or thrombus. LVEF was mildly reduced  45-50% w/ D shaped LV, RV severely enlarged and systolic fx mildly reduced, mild-moderate MR.   She presents today for routine f/u. Here w/ her husband. Notes increased fatigue, malaise and exertional dyspnea and new orthopnea over the last week. Device interrogation shows fluid overload. Impedence down, fluid index up and crossed threshold ~1/20. She also had an episode of Atrial fibrillation around this time that lasted ~3 hrs. No recurrence. This also correlates to when she was diagnosed w/ strep throat (completed abx).   She is fluid overloaded on exam. BP 104/78 c/w baseline. Reports good UOP w/ AM dose of torsemide but less output w/ evening dose. She is being followed by [facility] and is scheduled for renal US. She had f/u BMP on 1/27 and SCr was 1.9, K 3.3.  Also has OSA but not on CPAP (unable to tolerate).    Studies: TEE 1/23: EF 45-50%, D shaped LV, RV severely enlarged mildly reduced fx  Intraoperative TEE (Watchman implant) 12/22 EF 50-55%, RV mildly reduced Echo 10/22: 25-30%, severe LV dysfunction, RV ok, mod/severe TR. Echo 12/21: EF 35-40% (in setting of sepsis in [place]). Echo 2/21: EF 50%-55% mild to moderate TR  Echo 9/19: EF 40-45% Echo 11/17: EF 50-55%  Echo 6/16: EF 45-50% Echo 11/15: EF 50% inferior HK GLS -17.8 Echo 8/14: EF 50% Echo 5/11: EF 50-55%  Echo 4/13 EF 50%. RV normal.   12/16/11 ICD generator replaced due to EOL  ROS: All systems negative except as listed in HPI, PMH and Problem List.  Past Medical History:  Diagnosis Date   AICD (automatic cardioverter/defibrillator) present    Biventricular implantable cardiac defibrillator)    Medtronic   Breast cancer ([place]) 1998   - left   CHF (congestive heart failure)  ([place])    takes Lasix and Aldactone daily   Chronic kidney disease    Complication of anesthesia    n/v, none at last SURGERY-2022.   GERD (gastroesophageal reflux disease)    takes Omeprazole daily   Gout    takes Allopurinol daily   History of bronchitis 2 yrs ago   History of shingles    Hyperlipidemia    takes Simvastatin daily   Insomnia    takes Ambien nightly   Nonischemic cardiomyopathy ([place])    EF 55% echo 2012   NSVT (nonsustained ventricular tachycardia)    Obesity    OSA (obstructive sleep apnea)    Unable to wear CPAP   Pneumonia 2005   Presence of permanent cardiac pacemaker    Shortness of breath dyspnea    with exertion   sprint Fidelis    Current Outpatient Medications  Medication Sig Dispense Refill   alendronate (FOSAMAX) 70 MG tablet Take 70 mg by mouth every Monday.     allopurinol (ZYLOPRIM) 100 MG tablet Take 100 mg by mouth daily in the afternoon.     amiodarone (PACERONE) 200 MG tablet Take 200 mg by mouth daily.     atorvastatin (LIPITOR) 10 MG tablet Take 10 mg by mouth in the morning.     bisoprolol (ZEBETA) 5 MG tablet Take 1 tablet (5 mg total) by mouth daily. 30 tablet 6   buPROPion (WELLBUTRIN SR) 150 MG 12 hr tablet Take 150 mg by mouth 2 (two) times daily.     Cholecalciferol (VITAMIN D3) 5000 units TABS Take 5,000 Units by mouth in the morning.     clopidogrel (PLAVIX) 75 MG tablet Take 1 tablet (75 mg total) by mouth daily. 90 tablet 1   Cyanocobalamin (VITAMIN B-12) 5000 MCG SUBL Place 5,000 mcg under the tongue in the morning.     empagliflozin (JARDIANCE) 10 MG TABS tablet Take 1 tablet (10 mg total) by mouth daily before breakfast. 30 tablet 11   metolazone (ZAROXOLYN) 2.5 MG tablet Take 2.5 mg by mouth 2 (two) times a week. Tuesdays and Thursdays.     Multiple Vitamin (MULTIVITAMIN WITH MINERALS) TABS tablet Take 1 tablet by mouth in the morning. One-A-Day for Women 50+     pantoprazole (PROTONIX) 40 MG tablet Take 1 tablet (40 mg  total) by mouth daily. 90 tablet 3   potassium chloride SA (KLOR-CON M) 20 MEQ tablet Take 40 mEq by mouth 3 (three) times daily.     spironolactone (ALDACTONE) 25 MG tablet Take 25 mg by mouth daily in the afternoon.     torsemide (DEMADEX) 20 MG tablet TAKE 1 TABLET EVERY DAY 90 tablet 3   venlafaxine XR (EFFEXOR-XR) 150 MG 24 hr capsule Take 150 mg by mouth daily with breakfast.     zolpidem (AMBIEN) 10 MG tablet Take 10 mg by mouth at bedtime.     No current facility-administered medications  for this encounter.   BP 104/78    Pulse 74    Wt 90.1 kg    SpO2 96%    BMI 36.32 kg/m   Wt Readings from Last 3 Encounters:  10/16/21 90.1 kg  10/07/21 89.8 kg  09/22/21 86.5 kg   PHYSICAL EXAM: General:  Well appearing. No respiratory difficulty HEENT: normal Neck: supple. JVD 10 cm. Carotids 2+ bilat; no bruits. No lymphadenopathy or thyromegaly appreciated. Cor: PMI nondisplaced. Regular rate & rhythm. No rubs, gallops or murmurs. Lungs: clear Abdomen: soft, nontender, nondistended. No hepatosplenomegaly. No bruits or masses. Good bowel sounds. Extremities: no cyanosis, clubbing, rash, 2+ b/l LE edema Neuro: alert & oriented x 3, cranial nerves grossly intact. moves all 4 extremities w/o difficulty. Affect pleasant.    ICD interrogation: OptiVol impedence down, fluid index up c/w fluid overload. Episode of AT/AF ~3 hrs ~1/20, ~98 % v-pacing, No VT, ~3 hrs daily activity (personally reviewed).  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 - Echo 11/17 EF 50-55%  - Echo 9/19 EF 45% with septal dyssynergy (despite biv pacing). - Echo 2/21 EF 50-55% mild to moderate TR - RHC in 10/19 with relatively normal hemodynamics except for RA 10 (PCWP was 9)  - Echo 12/21 at [place] EF 35-40% in setting of sepsis. - Echo 12/12/20 EF 40-45% with dyssynchrony. RV ok  - Echo 10/22 EF 25-30%, severe LV dysfunction, RV ok, mod/severe TR. -  Intraoperative TEE (Watchman implant) 12/22 EF 50-55%, RV mildly reduced - Post implant TEE 1/23: EF 45-50%, D shaped LV, RV severely enlarged mildly reduced fx - Recent diuretic change given progressive CKD  - Now volume overloaded on exam and Optivol, NYHA Class III - Increase Torsemide back to 40 mg bid - Continue 2.5 mg Metolazone 2 days/week (qTue + Thru) + extra 40 mEq of K  - Continue Spiro 25 mg daily  - Continue Jardiance 10 mg daily  - Continue bisoprolol 5 mg daily. - No ARN/ARB w/ CKD and soft BP. - Pt brought labs from 08/12/21, SCr 2.16, BUN 29, K 3.3, Mg 2.8.  - BMP today - F/u in 2 weeks to reassess volume status  2) Medtronic BiV ICD:  - ICD interrogated as above  3) HTN - Well-controlled. - GDMT per above   4) OSA - Sleep study with moderate OSA.  - Following with Dr. Radford Pax. Unable to tolerate any CPAP. - Dr Radford Pax referred to ENT for [facility] Device.   5) PAF (SCAF) - CHA2DS2-VASc 4. - Unable to complete ablation.  - S/p Watchman implant 08/21/21 - Continue amiodarone 200 mg daily.  6) Obesity Body mass index is 36.32 kg/m. 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 BMP today    Follow up in 2 weeks to reassess volume status.   Lyda Jester, PA-C  2:04 PM  Patient seen and examined with the above-signed Advanced Practice Provider and/or Housestaff. I personally reviewed laboratory data, imaging studies and relevant notes. I independently examined the patient and formulated the important aspects of the plan. I have edited the note to reflect any of my changes or salient points. I have personally discussed the plan with the patient and/or family.  66 y/o with systolic HF, PAF s/p recent Watchman and CKD IV  Continues to struggle with NYHA IIIB symptoms and volume overload,   General:  obese woman.  No resp difficulty HEENT: normal Neck: supple. Jvp to jaw. Carotids 2+ bilat; no bruits. No lymphadenopathy or thryomegaly appreciated. Cor: PMI nondisplaced. Regular rate & rhythm. No rubs, gallops or murmurs. Lungs: clear Abdomen: obese soft, nontender, nondistended. No hepatosplenomegaly. No bruits or masses. Good bowel sounds. Extremities: no cyanosis, clubbing, rash, 2+ edema Neuro: alert & orientedx3, cranial nerves grossly intact. moves all 4 extremities w/o difficulty. Affect pleasant  Optivol elevated, No VT  Will make diuretic changes as above. We discussed fact that she may be nearing need for HD to keep fluid off but hopefully can avoid. See back in 2 weeks. ICD interrogate personally in Clinic.   Glori Bickers, MD  7:37 PM

## 2021-10-19 LAB — CUP PACEART REMOTE DEVICE CHECK
Battery Remaining Longevity: 6 mo
Battery Voltage: 2.82 V
Brady Statistic AP VP Percent: 22.74 %
Brady Statistic AP VS Percent: 0.06 %
Brady Statistic AS VP Percent: 75.7 %
Brady Statistic AS VS Percent: 1.5 %
Brady Statistic RA Percent Paced: 22.51 %
Brady Statistic RV Percent Paced: 96.98 %
Date Time Interrogation Session: 20230209153941
HighPow Impedance: 69 Ohm
Implantable Lead Implant Date: 20070821
Implantable Lead Implant Date: 20070821
Implantable Lead Implant Date: 20160211
Implantable Lead Location: 753858
Implantable Lead Location: 753859
Implantable Lead Location: 753860
Implantable Lead Model: 4194
Implantable Lead Model: 5076
Implantable Lead Model: 6935
Implantable Pulse Generator Implant Date: 20160211
Lead Channel Impedance Value: 133 Ohm
Lead Channel Impedance Value: 342 Ohm
Lead Channel Impedance Value: 399 Ohm
Lead Channel Impedance Value: 513 Ohm
Lead Channel Impedance Value: 532 Ohm
Lead Channel Impedance Value: 589 Ohm
Lead Channel Pacing Threshold Amplitude: 0.375 V
Lead Channel Pacing Threshold Amplitude: 1.125 V
Lead Channel Pacing Threshold Amplitude: 2.25 V
Lead Channel Pacing Threshold Pulse Width: 0.4 ms
Lead Channel Pacing Threshold Pulse Width: 0.4 ms
Lead Channel Pacing Threshold Pulse Width: 0.4 ms
Lead Channel Sensing Intrinsic Amplitude: 0.875 mV
Lead Channel Sensing Intrinsic Amplitude: 0.875 mV
Lead Channel Sensing Intrinsic Amplitude: 14.75 mV
Lead Channel Sensing Intrinsic Amplitude: 14.75 mV
Lead Channel Setting Pacing Amplitude: 1.5 V
Lead Channel Setting Pacing Amplitude: 1.75 V
Lead Channel Setting Pacing Amplitude: 3.5 V
Lead Channel Setting Pacing Pulse Width: 0.4 ms
Lead Channel Setting Pacing Pulse Width: 0.4 ms
Lead Channel Setting Sensing Sensitivity: 0.3 mV

## 2021-10-21 ENCOUNTER — Ambulatory Visit (HOSPITAL_COMMUNITY)
Admission: RE | Admit: 2021-10-21 | Discharge: 2021-10-21 | Disposition: A | Discharge status: Home / Self Care | Payer: Medicare PPO | Source: Ambulatory Visit | Attending: Nephrology | Admitting: Nephrology

## 2021-10-21 ENCOUNTER — Other Ambulatory Visit: Payer: Self-pay

## 2021-10-21 ENCOUNTER — Other Ambulatory Visit (HOSPITAL_COMMUNITY): Payer: Self-pay | Admitting: Family Medicine

## 2021-10-21 DIAGNOSIS — I480 Paroxysmal atrial fibrillation: Secondary | ICD-10-CM | POA: Insufficient documentation

## 2021-10-21 DIAGNOSIS — I129 Hypertensive chronic kidney disease with stage 1 through stage 4 chronic kidney disease, or unspecified chronic kidney disease: Secondary | ICD-10-CM | POA: Insufficient documentation

## 2021-10-21 DIAGNOSIS — N170 Acute kidney failure with tubular necrosis: Secondary | ICD-10-CM | POA: Insufficient documentation

## 2021-10-21 DIAGNOSIS — N182 Chronic kidney disease, stage 2 (mild): Secondary | ICD-10-CM | POA: Insufficient documentation

## 2021-10-21 DIAGNOSIS — I5022 Chronic systolic (congestive) heart failure: Secondary | ICD-10-CM | POA: Diagnosis present

## 2021-10-21 NOTE — Progress Notes (Signed)
Remote ICD transmission.   

## 2021-10-27 ENCOUNTER — Inpatient Hospital Stay (HOSPITAL_COMMUNITY)
Admission: AD | Admit: 2021-10-27 | Discharge: 2021-12-06 | DRG: 216 | Disposition: E | Payer: Medicare PPO | Source: Other Acute Inpatient Hospital | Attending: Cardiothoracic Surgery | Admitting: Cardiothoracic Surgery

## 2021-10-27 ENCOUNTER — Other Ambulatory Visit: Payer: Self-pay

## 2021-10-27 ENCOUNTER — Encounter (HOSPITAL_COMMUNITY): Payer: Self-pay | Admitting: Cardiology

## 2021-10-27 DIAGNOSIS — Z6841 Body Mass Index (BMI) 40.0 and over, adult: Secondary | ICD-10-CM

## 2021-10-27 DIAGNOSIS — I511 Rupture of chordae tendineae, not elsewhere classified: Secondary | ICD-10-CM | POA: Diagnosis present

## 2021-10-27 DIAGNOSIS — K635 Polyp of colon: Secondary | ICD-10-CM | POA: Diagnosis not present

## 2021-10-27 DIAGNOSIS — N170 Acute kidney failure with tubular necrosis: Secondary | ICD-10-CM | POA: Diagnosis present

## 2021-10-27 DIAGNOSIS — Z885 Allergy status to narcotic agent status: Secondary | ICD-10-CM

## 2021-10-27 DIAGNOSIS — I50810 Right heart failure, unspecified: Secondary | ICD-10-CM | POA: Diagnosis not present

## 2021-10-27 DIAGNOSIS — R109 Unspecified abdominal pain: Secondary | ICD-10-CM

## 2021-10-27 DIAGNOSIS — T8144XA Sepsis following a procedure, initial encounter: Secondary | ICD-10-CM | POA: Diagnosis not present

## 2021-10-27 DIAGNOSIS — E440 Moderate protein-calorie malnutrition: Secondary | ICD-10-CM | POA: Diagnosis present

## 2021-10-27 DIAGNOSIS — K633 Ulcer of intestine: Secondary | ICD-10-CM | POA: Diagnosis present

## 2021-10-27 DIAGNOSIS — I428 Other cardiomyopathies: Secondary | ICD-10-CM | POA: Diagnosis present

## 2021-10-27 DIAGNOSIS — I071 Rheumatic tricuspid insufficiency: Secondary | ICD-10-CM | POA: Diagnosis present

## 2021-10-27 DIAGNOSIS — R570 Cardiogenic shock: Secondary | ICD-10-CM | POA: Diagnosis not present

## 2021-10-27 DIAGNOSIS — E872 Acidosis, unspecified: Secondary | ICD-10-CM | POA: Diagnosis not present

## 2021-10-27 DIAGNOSIS — G4700 Insomnia, unspecified: Secondary | ICD-10-CM | POA: Diagnosis present

## 2021-10-27 DIAGNOSIS — I3139 Other pericardial effusion (noninflammatory): Secondary | ICD-10-CM | POA: Diagnosis present

## 2021-10-27 DIAGNOSIS — T451X5A Adverse effect of antineoplastic and immunosuppressive drugs, initial encounter: Secondary | ICD-10-CM | POA: Diagnosis present

## 2021-10-27 DIAGNOSIS — Z7983 Long term (current) use of bisphosphonates: Secondary | ICD-10-CM

## 2021-10-27 DIAGNOSIS — J810 Acute pulmonary edema: Secondary | ICD-10-CM | POA: Diagnosis not present

## 2021-10-27 DIAGNOSIS — E871 Hypo-osmolality and hyponatremia: Secondary | ICD-10-CM | POA: Diagnosis present

## 2021-10-27 DIAGNOSIS — K449 Diaphragmatic hernia without obstruction or gangrene: Secondary | ICD-10-CM | POA: Diagnosis present

## 2021-10-27 DIAGNOSIS — I130 Hypertensive heart and chronic kidney disease with heart failure and stage 1 through stage 4 chronic kidney disease, or unspecified chronic kidney disease: Secondary | ICD-10-CM | POA: Diagnosis present

## 2021-10-27 DIAGNOSIS — I447 Left bundle-branch block, unspecified: Secondary | ICD-10-CM | POA: Diagnosis present

## 2021-10-27 DIAGNOSIS — Z90710 Acquired absence of both cervix and uterus: Secondary | ICD-10-CM

## 2021-10-27 DIAGNOSIS — J690 Pneumonitis due to inhalation of food and vomit: Secondary | ICD-10-CM | POA: Diagnosis not present

## 2021-10-27 DIAGNOSIS — K559 Vascular disorder of intestine, unspecified: Secondary | ICD-10-CM | POA: Diagnosis present

## 2021-10-27 DIAGNOSIS — I361 Nonrheumatic tricuspid (valve) insufficiency: Secondary | ICD-10-CM | POA: Diagnosis not present

## 2021-10-27 DIAGNOSIS — X58XXXA Exposure to other specified factors, initial encounter: Secondary | ICD-10-CM | POA: Diagnosis present

## 2021-10-27 DIAGNOSIS — Z9689 Presence of other specified functional implants: Secondary | ICD-10-CM

## 2021-10-27 DIAGNOSIS — I472 Ventricular tachycardia, unspecified: Secondary | ICD-10-CM | POA: Diagnosis not present

## 2021-10-27 DIAGNOSIS — E1122 Type 2 diabetes mellitus with diabetic chronic kidney disease: Secondary | ICD-10-CM | POA: Diagnosis present

## 2021-10-27 DIAGNOSIS — Z888 Allergy status to other drugs, medicaments and biological substances status: Secondary | ICD-10-CM

## 2021-10-27 DIAGNOSIS — K921 Melena: Secondary | ICD-10-CM | POA: Diagnosis not present

## 2021-10-27 DIAGNOSIS — N179 Acute kidney failure, unspecified: Secondary | ICD-10-CM

## 2021-10-27 DIAGNOSIS — Z452 Encounter for adjustment and management of vascular access device: Secondary | ICD-10-CM

## 2021-10-27 DIAGNOSIS — I5022 Chronic systolic (congestive) heart failure: Secondary | ICD-10-CM | POA: Diagnosis not present

## 2021-10-27 DIAGNOSIS — D72829 Elevated white blood cell count, unspecified: Secondary | ICD-10-CM

## 2021-10-27 DIAGNOSIS — N184 Chronic kidney disease, stage 4 (severe): Secondary | ICD-10-CM | POA: Diagnosis not present

## 2021-10-27 DIAGNOSIS — I427 Cardiomyopathy due to drug and external agent: Secondary | ICD-10-CM | POA: Diagnosis present

## 2021-10-27 DIAGNOSIS — I5082 Biventricular heart failure: Secondary | ICD-10-CM | POA: Diagnosis present

## 2021-10-27 DIAGNOSIS — J80 Acute respiratory distress syndrome: Secondary | ICD-10-CM | POA: Diagnosis not present

## 2021-10-27 DIAGNOSIS — Z8619 Personal history of other infectious and parasitic diseases: Secondary | ICD-10-CM

## 2021-10-27 DIAGNOSIS — I482 Chronic atrial fibrillation, unspecified: Secondary | ICD-10-CM | POA: Diagnosis not present

## 2021-10-27 DIAGNOSIS — K7200 Acute and subacute hepatic failure without coma: Secondary | ICD-10-CM | POA: Diagnosis not present

## 2021-10-27 DIAGNOSIS — R7401 Elevation of levels of liver transaminase levels: Secondary | ICD-10-CM

## 2021-10-27 DIAGNOSIS — K8309 Other cholangitis: Secondary | ICD-10-CM | POA: Diagnosis not present

## 2021-10-27 DIAGNOSIS — Z7902 Long term (current) use of antithrombotics/antiplatelets: Secondary | ICD-10-CM

## 2021-10-27 DIAGNOSIS — K219 Gastro-esophageal reflux disease without esophagitis: Secondary | ICD-10-CM | POA: Diagnosis present

## 2021-10-27 DIAGNOSIS — Z01818 Encounter for other preprocedural examination: Secondary | ICD-10-CM

## 2021-10-27 DIAGNOSIS — K567 Ileus, unspecified: Secondary | ICD-10-CM | POA: Diagnosis not present

## 2021-10-27 DIAGNOSIS — I4819 Other persistent atrial fibrillation: Secondary | ICD-10-CM | POA: Diagnosis present

## 2021-10-27 DIAGNOSIS — K5751 Diverticulosis of both small and large intestine without perforation or abscess with bleeding: Secondary | ICD-10-CM | POA: Diagnosis not present

## 2021-10-27 DIAGNOSIS — Z515 Encounter for palliative care: Secondary | ICD-10-CM

## 2021-10-27 DIAGNOSIS — Z9012 Acquired absence of left breast and nipple: Secondary | ICD-10-CM

## 2021-10-27 DIAGNOSIS — D122 Benign neoplasm of ascending colon: Secondary | ICD-10-CM | POA: Diagnosis not present

## 2021-10-27 DIAGNOSIS — Z95810 Presence of automatic (implantable) cardiac defibrillator: Secondary | ICD-10-CM

## 2021-10-27 DIAGNOSIS — G4733 Obstructive sleep apnea (adult) (pediatric): Secondary | ICD-10-CM | POA: Diagnosis present

## 2021-10-27 DIAGNOSIS — E1165 Type 2 diabetes mellitus with hyperglycemia: Secondary | ICD-10-CM | POA: Diagnosis not present

## 2021-10-27 DIAGNOSIS — J9600 Acute respiratory failure, unspecified whether with hypoxia or hypercapnia: Secondary | ICD-10-CM | POA: Diagnosis not present

## 2021-10-27 DIAGNOSIS — R34 Anuria and oliguria: Secondary | ICD-10-CM | POA: Diagnosis not present

## 2021-10-27 DIAGNOSIS — Z01810 Encounter for preprocedural cardiovascular examination: Secondary | ICD-10-CM | POA: Diagnosis not present

## 2021-10-27 DIAGNOSIS — Z833 Family history of diabetes mellitus: Secondary | ICD-10-CM

## 2021-10-27 DIAGNOSIS — Z8249 Family history of ischemic heart disease and other diseases of the circulatory system: Secondary | ICD-10-CM

## 2021-10-27 DIAGNOSIS — Z4659 Encounter for fitting and adjustment of other gastrointestinal appliance and device: Secondary | ICD-10-CM

## 2021-10-27 DIAGNOSIS — Z79899 Other long term (current) drug therapy: Secondary | ICD-10-CM

## 2021-10-27 DIAGNOSIS — J939 Pneumothorax, unspecified: Secondary | ICD-10-CM

## 2021-10-27 DIAGNOSIS — Z9911 Dependence on respirator [ventilator] status: Secondary | ICD-10-CM | POA: Diagnosis not present

## 2021-10-27 DIAGNOSIS — E785 Hyperlipidemia, unspecified: Secondary | ICD-10-CM | POA: Diagnosis present

## 2021-10-27 DIAGNOSIS — I480 Paroxysmal atrial fibrillation: Secondary | ICD-10-CM | POA: Diagnosis not present

## 2021-10-27 DIAGNOSIS — D62 Acute posthemorrhagic anemia: Secondary | ICD-10-CM | POA: Diagnosis present

## 2021-10-27 DIAGNOSIS — R6521 Severe sepsis with septic shock: Secondary | ICD-10-CM | POA: Diagnosis not present

## 2021-10-27 DIAGNOSIS — Z978 Presence of other specified devices: Secondary | ICD-10-CM

## 2021-10-27 DIAGNOSIS — Z20822 Contact with and (suspected) exposure to covid-19: Secondary | ICD-10-CM | POA: Diagnosis present

## 2021-10-27 DIAGNOSIS — I5023 Acute on chronic systolic (congestive) heart failure: Secondary | ICD-10-CM | POA: Diagnosis not present

## 2021-10-27 DIAGNOSIS — M109 Gout, unspecified: Secondary | ICD-10-CM | POA: Diagnosis present

## 2021-10-27 DIAGNOSIS — J9690 Respiratory failure, unspecified, unspecified whether with hypoxia or hypercapnia: Secondary | ICD-10-CM

## 2021-10-27 DIAGNOSIS — I462 Cardiac arrest due to underlying cardiac condition: Secondary | ICD-10-CM | POA: Diagnosis not present

## 2021-10-27 DIAGNOSIS — Z66 Do not resuscitate: Secondary | ICD-10-CM | POA: Diagnosis not present

## 2021-10-27 DIAGNOSIS — K640 First degree hemorrhoids: Secondary | ICD-10-CM | POA: Diagnosis present

## 2021-10-27 DIAGNOSIS — A419 Sepsis, unspecified organism: Secondary | ICD-10-CM | POA: Diagnosis not present

## 2021-10-27 DIAGNOSIS — Z9889 Other specified postprocedural states: Secondary | ICD-10-CM

## 2021-10-27 DIAGNOSIS — N1832 Chronic kidney disease, stage 3b: Secondary | ICD-10-CM | POA: Diagnosis not present

## 2021-10-27 DIAGNOSIS — K5750 Diverticulosis of both small and large intestine without perforation or abscess without bleeding: Secondary | ICD-10-CM | POA: Diagnosis present

## 2021-10-27 DIAGNOSIS — Z9049 Acquired absence of other specified parts of digestive tract: Secondary | ICD-10-CM

## 2021-10-27 DIAGNOSIS — Z0189 Encounter for other specified special examinations: Secondary | ICD-10-CM

## 2021-10-27 DIAGNOSIS — I4901 Ventricular fibrillation: Secondary | ICD-10-CM | POA: Diagnosis not present

## 2021-10-27 DIAGNOSIS — D689 Coagulation defect, unspecified: Secondary | ICD-10-CM | POA: Diagnosis not present

## 2021-10-27 DIAGNOSIS — E876 Hypokalemia: Secondary | ICD-10-CM | POA: Diagnosis not present

## 2021-10-27 DIAGNOSIS — J9601 Acute respiratory failure with hypoxia: Secondary | ICD-10-CM | POA: Diagnosis not present

## 2021-10-27 DIAGNOSIS — R0902 Hypoxemia: Secondary | ICD-10-CM

## 2021-10-27 DIAGNOSIS — Z09 Encounter for follow-up examination after completed treatment for conditions other than malignant neoplasm: Secondary | ICD-10-CM

## 2021-10-27 DIAGNOSIS — Z853 Personal history of malignant neoplasm of breast: Secondary | ICD-10-CM

## 2021-10-27 DIAGNOSIS — Z7984 Long term (current) use of oral hypoglycemic drugs: Secondary | ICD-10-CM

## 2021-10-27 DIAGNOSIS — Z820 Family history of epilepsy and other diseases of the nervous system: Secondary | ICD-10-CM

## 2021-10-27 DIAGNOSIS — I351 Nonrheumatic aortic (valve) insufficiency: Secondary | ICD-10-CM | POA: Diagnosis not present

## 2021-10-27 DIAGNOSIS — R0602 Shortness of breath: Secondary | ICD-10-CM

## 2021-10-27 DIAGNOSIS — J811 Chronic pulmonary edema: Secondary | ICD-10-CM

## 2021-10-27 DIAGNOSIS — I4891 Unspecified atrial fibrillation: Secondary | ICD-10-CM | POA: Diagnosis not present

## 2021-10-27 LAB — COMPREHENSIVE METABOLIC PANEL
ALT: 25 U/L (ref 0–44)
AST: 33 U/L (ref 15–41)
Albumin: 2.6 g/dL — ABNORMAL LOW (ref 3.5–5.0)
Alkaline Phosphatase: 111 U/L (ref 38–126)
Anion gap: 13 (ref 5–15)
BUN: 47 mg/dL — ABNORMAL HIGH (ref 8–23)
CO2: 24 mmol/L (ref 22–32)
Calcium: 8.4 mg/dL — ABNORMAL LOW (ref 8.9–10.3)
Chloride: 94 mmol/L — ABNORMAL LOW (ref 98–111)
Creatinine, Ser: 2.78 mg/dL — ABNORMAL HIGH (ref 0.44–1.00)
GFR, Estimated: 18 mL/min — ABNORMAL LOW (ref >60)
Glucose, Bld: 127 mg/dL — ABNORMAL HIGH (ref 70–99)
Potassium: 3.1 mmol/L — ABNORMAL LOW (ref 3.5–5.1)
Sodium: 131 mmol/L — ABNORMAL LOW (ref 135–145)
Total Bilirubin: 0.9 mg/dL (ref 0.3–1.2)
Total Protein: 5.9 g/dL — ABNORMAL LOW (ref 6.5–8.1)

## 2021-10-27 LAB — CBC
HCT: 28.2 % — ABNORMAL LOW (ref 36.0–46.0)
Hemoglobin: 8.8 g/dL — ABNORMAL LOW (ref 12.0–15.0)
MCH: 29.3 pg (ref 26.0–34.0)
MCHC: 31.2 g/dL (ref 30.0–36.0)
MCV: 94 fL (ref 80.0–100.0)
Platelets: 534 10*3/uL — ABNORMAL HIGH (ref 150–400)
RBC: 3 MIL/uL — ABNORMAL LOW (ref 3.87–5.11)
RDW: 18.2 % — ABNORMAL HIGH (ref 11.5–15.5)
WBC: 13.8 10*3/uL — ABNORMAL HIGH (ref 4.0–10.5)
nRBC: 0.2 % (ref 0.0–0.2)

## 2021-10-27 LAB — RESP PANEL BY RT-PCR (FLU A&B, COVID) ARPGX2
Influenza A by PCR: NEGATIVE
Influenza B by PCR: NEGATIVE
SARS Coronavirus 2 by RT PCR: NEGATIVE

## 2021-10-27 LAB — BRAIN NATRIURETIC PEPTIDE: B Natriuretic Peptide: 477.9 pg/mL — ABNORMAL HIGH (ref 0.0–100.0)

## 2021-10-27 LAB — GLUCOSE, CAPILLARY: Glucose-Capillary: 82 mg/dL (ref 70–99)

## 2021-10-27 LAB — MRSA NEXT GEN BY PCR, NASAL: MRSA by PCR Next Gen: NOT DETECTED

## 2021-10-27 MED ORDER — ALLOPURINOL 100 MG PO TABS
100.0000 mg | ORAL_TABLET | Freq: Every day | ORAL | Status: DC
  Administered 2021-10-28 – 2021-11-03 (×7): 100 mg via ORAL
  Filled 2021-10-27 (×7): qty 1

## 2021-10-27 MED ORDER — ONDANSETRON HCL 4 MG/2ML IJ SOLN: 4.0000 mg | Freq: Four times a day (QID) | INTRAMUSCULAR | Status: DC | PRN

## 2021-10-27 MED ORDER — POTASSIUM CHLORIDE CRYS ER 20 MEQ PO TBCR
40.0000 meq | EXTENDED_RELEASE_TABLET | Freq: Three times a day (TID) | ORAL | Status: AC
  Administered 2021-10-28 (×2): 40 meq via ORAL
  Filled 2021-10-27 (×2): qty 2

## 2021-10-27 MED ORDER — ATORVASTATIN CALCIUM 10 MG PO TABS
10.0000 mg | ORAL_TABLET | Freq: Every morning | ORAL | Status: DC
  Administered 2021-10-28 – 2021-11-05 (×9): 10 mg via ORAL
  Filled 2021-10-27 (×9): qty 1

## 2021-10-27 MED ORDER — ACETAMINOPHEN 325 MG PO TABS: 650.0000 mg | ORAL_TABLET | ORAL | Status: DC | PRN

## 2021-10-27 MED ORDER — SODIUM CHLORIDE 0.9 % IV SOLN: INTRAVENOUS | Status: AC

## 2021-10-27 MED ORDER — PANTOPRAZOLE SODIUM 40 MG PO TBEC
40.0000 mg | DELAYED_RELEASE_TABLET | Freq: Every day | ORAL | Status: DC
  Administered 2021-10-28 – 2021-11-03 (×7): 40 mg via ORAL
  Filled 2021-10-27 (×7): qty 1

## 2021-10-27 MED ORDER — CLOPIDOGREL BISULFATE 75 MG PO TABS: 75.0000 mg | ORAL_TABLET | Freq: Every day | ORAL | Status: DC

## 2021-10-27 MED ORDER — ZOLPIDEM TARTRATE 5 MG PO TABS
5.0000 mg | ORAL_TABLET | Freq: Every day | ORAL | Status: DC
  Administered 2021-10-27 – 2021-11-02 (×7): 5 mg via ORAL
  Filled 2021-10-27 (×7): qty 1

## 2021-10-27 MED ORDER — VITAMIN B-12 5000 MCG SL SUBL: 5000.0000 ug | SUBLINGUAL_TABLET | Freq: Every morning | SUBLINGUAL | Status: DC

## 2021-10-27 MED ORDER — NOREPINEPHRINE 4 MG/250ML-% IV SOLN
0.0000 ug/min | INTRAVENOUS | Status: DC
  Administered 2021-10-27: 10 ug/min via INTRAVENOUS
  Administered 2021-10-28: 6 ug/min via INTRAVENOUS
  Administered 2021-10-28: 16 ug/min via INTRAVENOUS
  Administered 2021-10-28: 12 ug/min via INTRAVENOUS
  Administered 2021-10-28 (×2): 18 ug/min via INTRAVENOUS
  Administered 2021-10-29: 7 ug/min via INTRAVENOUS
  Filled 2021-10-27 (×6): qty 250

## 2021-10-27 MED ORDER — ADULT MULTIVITAMIN W/MINERALS CH
1.0000 | ORAL_TABLET | Freq: Every day | ORAL | Status: DC
  Administered 2021-10-28 – 2021-11-03 (×7): 1 via ORAL
  Filled 2021-10-27 (×7): qty 1

## 2021-10-27 MED ORDER — VITAMIN D 25 MCG (1000 UNIT) PO TABS
5000.0000 [IU] | ORAL_TABLET | Freq: Every morning | ORAL | Status: DC
  Administered 2021-10-28 – 2021-11-03 (×7): 5000 [IU] via ORAL
  Filled 2021-10-27 (×9): qty 5

## 2021-10-27 MED ORDER — VENLAFAXINE HCL ER 150 MG PO CP24
150.0000 mg | ORAL_CAPSULE | Freq: Every day | ORAL | Status: DC
  Administered 2021-10-28 – 2021-11-05 (×8): 150 mg via ORAL
  Filled 2021-10-27 (×9): qty 1

## 2021-10-27 MED ORDER — NITROGLYCERIN 0.4 MG SL SUBL: 0.4000 mg | SUBLINGUAL_TABLET | SUBLINGUAL | Status: DC | PRN

## 2021-10-27 MED ORDER — NOREPINEPHRINE 4 MG/250ML-% IV SOLN
INTRAVENOUS | Status: AC
  Administered 2021-10-27: 8 mg
  Filled 2021-10-27: qty 250

## 2021-10-27 MED ORDER — EMPAGLIFLOZIN 10 MG PO TABS
10.0000 mg | ORAL_TABLET | Freq: Every day | ORAL | Status: DC
  Filled 2021-10-27: qty 1

## 2021-10-27 MED ORDER — BUPROPION HCL ER (SR) 150 MG PO TB12
150.0000 mg | ORAL_TABLET | Freq: Two times a day (BID) | ORAL | Status: DC
  Administered 2021-10-27 – 2021-11-04 (×16): 150 mg via ORAL
  Filled 2021-10-27 (×20): qty 1

## 2021-10-27 NOTE — H&P (Addendum)
Cardiology Admission History and Physical:   Patient ID: [name] MRN: [id number]; DOB: 10-04-1955   Admission date: 10/30/2021  PCP:  Wayna Chalet, NP   [facility] Providers Cardiologist:  None  Electrophysiologist:  Virl Axe, MD  Sleep Medicine:  Fransico Him, MD       Chief Complaint:  "bright blood in the stool"  Patient Profile:   [name] is a 66 y.o. female with hx of paroxymal atrial fibrillation with recent failed ablation S/p Watchman implant 08/21/21,  breast CA in 1998 s/p left breast surgery and s/p Adriamycin x 4 cycles, non-ischemic cardiomyopathy/chronic systolic CHF with prior EF now at 45-50% (TEE 10/07/21), s/p BiV Medtronic ICD, HTN, OSA, and CKD Stage IIIb  who is being seen 10/15/2021 for the evaluation of pericardial effusion.  History of Present Illness:   [name] reports she went to [facility] ER today for evaluation of "bright blood in the stool for 2 days", and she was found to have a large amount of pericardial effusion and transferred her for further evaluation. She also reports increased fatigue, malaise, PND and exertional dyspnea for 2 weeks which have slightly improved after Dr. Haroldine Laws adjusted her torsemide. Currently, she denies any symptoms, on room air, HR 70bpm (paced), BP 107/53 on 8 levophed. Patient has not gotten out of the bed much this past weekend. Denies fever, chills, syncope, ICD shock, chest discomfort, heart palpitations, nausea, vomiting, PND, abdominal fullness/pain, dysuria, or diarrhea.  Her TEE on 10/07/21 reported RV volume overload, there is no evidence of pericardial effusion.  OSH: K 3.4, Cr 3.16, Hgb 8.9, WBC 13.9 CT reported a new moderate to large pericardial effusion, colonic diverticulosis without CT findings of acute diverticulitis   Past Medical History:  Diagnosis Date   AICD (automatic cardioverter/defibrillator) present    Biventricular implantable cardiac  defibrillator)    Medtronic   Breast cancer ([place]) 1998   - left   CHF (congestive heart failure) ([place])    takes Lasix and Aldactone daily   Chronic kidney disease    Complication of anesthesia    n/v, none at last SURGERY-2022.   GERD (gastroesophageal reflux disease)    takes Omeprazole daily   Gout    takes Allopurinol daily   History of bronchitis 2 yrs ago   History of shingles    Hyperlipidemia    takes Simvastatin daily   Insomnia    takes Ambien nightly   Nonischemic cardiomyopathy ([place])    EF 55% echo 2012   NSVT (nonsustained ventricular tachycardia)    Obesity    OSA (obstructive sleep apnea)    Unable to wear CPAP   Pneumonia 2005   Presence of permanent cardiac pacemaker    Shortness of breath dyspnea    with exertion   sprint Fidelis     Past Surgical History:  Procedure Laterality Date   ABDOMINAL HYSTERECTOMY     APPENDECTOMY  09/07/1976   ATRIAL FIBRILLATION ABLATION N/A 06/30/2021   Procedure: ATRIAL FIBRILLATION ABLATION;  Surgeon: Vickie Epley, MD;  Location: [place] CV [facility];  Service: Cardiovascular;  Laterality: N/A;   BiV ICD  09/07/2005   Medtronic   BIV ICD GENERTAOR CHANGE OUT N/A 12/16/2011   Procedure: BIV ICD GENERTAOR CHANGE OUT;  Surgeon: [name], MD;  Location: [facility] CATH LAB;  Service: Cardiovascular;  Laterality: N/A;   CHOLECYSTECTOMY  09/07/2017   COLONOSCOPY     cyst removed from wrist Left    early  70's   ICD LEAD REMOVAL Right 10/18/2014   Procedure: ICD LEAD REMOVAL/EXTRACTION/REIMPLANTATION;  Surgeon: Evans Lance, MD;  Location: [place];  Service: Cardiovascular;  Laterality: Right;   KYPHOPLASTY N/A 06/06/2021   Procedure: KYPHOPLASTY LUMBAR ONE;  Surgeon: Karsten Ro, DO;  Location: [place];  Service: Neurosurgery;  Laterality: N/A;   LEFT ATRIAL APPENDAGE OCCLUSION N/A 08/21/2021   Procedure: LEFT ATRIAL APPENDAGE OCCLUSION;  Surgeon: Vickie Epley, MD;  Location: [place] CV [facility];  Service:  Cardiovascular;  Laterality: N/A;   MASTECTOMY Left 09/07/1996   port a cath placed  09/07/1996   port removed  09/07/1996   RIGHT HEART CATH N/A 06/20/2018   Procedure: RIGHT HEART CATH;  Surgeon: Jolaine Artist, MD;  Location: [place] CV LAB;  Service: Cardiovascular;  Laterality: N/A;   TEE WITHOUT CARDIOVERSION N/A 08/21/2021   Procedure: TRANSESOPHAGEAL ECHOCARDIOGRAM (TEE);  Surgeon: Vickie Epley, MD;  Location: [place] CV [facility];  Service: Cardiovascular;  Laterality: N/A;   TEE WITHOUT CARDIOVERSION N/A 10/07/2021   Procedure: TRANSESOPHAGEAL ECHOCARDIOGRAM (TEE);  Surgeon: Sanda Klein, MD;  [facility] ENDOSCOPY;  Service: Cardiovascular;  Laterality: N/A;     Medications Prior to Admission: Prior to Admission medications   Medication Sig Start Date End Date Taking? Authorizing Provider  alendronate (FOSAMAX) 70 MG tablet Take 70 mg by mouth every Monday.    [provider]  allopurinol (ZYLOPRIM) 100 MG tablet Take 100 mg by mouth daily in the afternoon. 03/18/18   [provider]  amiodarone (PACERONE) 200 MG tablet Take 200 mg by mouth daily.    [provider]  atorvastatin (LIPITOR) 10 MG tablet Take 10 mg by mouth in the morning.    [provider]  bisoprolol (ZEBETA) 5 MG tablet Take 1 tablet (5 mg total) by mouth daily. 07/16/21   Rafael Bihari, FNP  buPROPion (WELLBUTRIN SR) 150 MG 12 hr tablet Take 150 mg by mouth 2 (two) times daily. 04/18/21   [provider]  Cholecalciferol (VITAMIN D3) 5000 units TABS Take 5,000 Units by mouth in the morning.    [provider]  clopidogrel (PLAVIX) 75 MG tablet Take 1 tablet (75 mg total) by mouth daily. 10/07/21 04/05/22  Tommie Raymond, NP  Cyanocobalamin (VITAMIN B-12) 5000 MCG SUBL Place 5,000 mcg under the tongue in the morning.    [provider]  JARDIANCE 10 MG TABS tablet TAKE 1 TABLET BY MOUTH DAILY BEFORE BREAKFAST. 10/21/21   Larey Dresser, MD  metolazone (ZAROXOLYN) 2.5 MG tablet Take 2.5 mg by mouth 2 (two) times a week. Tuesdays and Thursdays.    [provider]  Multiple Vitamin (MULTIVITAMIN WITH MINERALS) TABS tablet Take 1 tablet by mouth in the morning. One-A-Day for Women 50+    [provider]  pantoprazole (PROTONIX) 40 MG tablet Take 1 tablet (40 mg total) by mouth daily. 08/22/21   Kathyrn Drown D, NP  potassium chloride SA (KLOR-CON M) 20 MEQ tablet Take 40 mEq by mouth 3 (three) times daily.    [provider]  spironolactone (ALDACTONE) 25 MG tablet Take 25 mg by mouth daily in the afternoon.    [provider]  torsemide (DEMADEX) 20 MG tablet Take 2 tablets (40 mg total) by mouth 2 (two) times daily. 10/16/21   Bensimhon, Shaune Pascal, MD  venlafaxine XR (EFFEXOR-XR) 150 MG 24 hr capsule Take 150 mg by mouth daily with breakfast.    [provider]  zolpidem (AMBIEN) 10 MG tablet Take 10 mg by mouth at bedtime.    [provider]     Allergies:    Allergies  Allergen Reactions   Buprenorphine Hcl     Patient required emergent use of naloxone and naloxone drip in ICU for only taking Percocet 5-325 over the course of one day. She appears to have impaired metabolism of opioid medications and providers should use these medications with extreme caution.   Morphine And Related Other (See Comments)    Patient required emergent use of naloxone and naloxone drip in ICU for only taking Percocet 5-325 over the course of one day. She appears to have impaired metabolism of opioid medications and providers should use these medications with extreme caution.   Percocet [Oxycodone-Acetaminophen] Shortness Of Breath    Hospitalized   Oxycodone Other (See Comments)    Mental status changes    Social History:   Social History   Socioeconomic History   Marital status: Married    Spouse name: Not on file   Number of children: Not on file   Years of education: Not on  file   Highest education level: Not on file  Occupational History   Not on file  Tobacco Use   Smoking status: Never   Smokeless tobacco: Never  Vaping Use   Vaping Use: Never used  Substance and Sexual Activity   Alcohol use: No   Drug use: No   Sexual activity: Yes    Birth control/protection: None  Other Topics Concern   Not on file  Social History Narrative   Not on file   Social Determinants of Health   Financial Resource Strain: Not on file  Food Insecurity: Not on file  Transportation Needs: Not on file  Physical Activity: Not on file  Stress: Not on file  Social Connections: Not on file  Intimate Partner Violence: Not on file    Family History:   The patient's family history includes Alzheimer's disease in her father; Diabetes in her mother; Heart disease in her father.    ROS:  Please see the history of present illness.  All other ROS reviewed and negative.     Physical Exam/Data:   Vitals:   10/24/2021 2030 10/25/2021 2040 10/15/2021 2050 10/10/2021 2100  BP: (!) 104/25 121/65 (!) 80/70 (!) 107/53  Pulse: 75 72 72 70  Resp: (!) 22 (!) 25 (!) 22 (!) 24  SpO2: (!) 87% 100% 98% 96%  Height:       No intake or output data in the 24 hours ending 10/13/2021 2108 Last 3 Weights 10/16/2021 10/07/2021 09/22/2021  Weight (lbs) 198 lb 9.6 oz 198 lb 190 lb 12.8 oz  Weight (kg) 90.084 kg 89.812 kg 86.546 kg     Body mass index is 36.32 kg/m.  General:  Well nourished, well developed, in no acute distress HEENT: normal Neck: JVD Vascular: No carotid bruits; Distal pulses 2+ bilaterally   Cardiac:  distal S1, S2; RRR; no murmur  Lungs:  clear to auscultation bilaterally, no wheezing, rhonchi or rales  Abd: soft, nontender, no hepatomegaly  Ext: 1+ edema Musculoskeletal:  No deformities, BUE and BLE strength normal and equal Skin: warm and dry  Neuro:  CNs 2-12 intact, no focal abnormalities noted Psych:  Normal affect    Relevant CV Studies: Laboratory Data:  High  Sensitivity Troponin:  No results for input(s): TROPONINIHS in the last 720 hours.    ChemistryNo results for input(s): NA, K, CL, CO2,  GLUCOSE, BUN, CREATININE, CALCIUM, MG, GFRNONAA, GFRAA, ANIONGAP in the last 168 hours.  No results for input(s): PROT, ALBUMIN, AST, ALT, ALKPHOS, BILITOT in the last 168 hours. Lipids No results for input(s): CHOL, TRIG, HDL, LABVLDL, LDLCALC, CHOLHDL in the last 168 hours. HematologyNo results for input(s): WBC, RBC, HGB, HCT, MCV, MCH, MCHC, RDW, PLT in the last 168 hours. Thyroid No results for input(s): TSH, FREET4 in the last 168 hours. BNPNo results for input(s): BNP, PROBNP in the last 168 hours.  DDimer No results for input(s): DDIMER in the last 168 hours.   Radiology/Studies:  No results found.   Assessment and Plan:   #Pericardial effusion -bedside echo demonstrated echocardiographic tamponade (RV collapse, etc). Fortunately, patient's is NOT tachycardia, on room air, on minimal Levophed -hold her home diuresis, anti-HTN and BB -IV fluid support judiciously given hx of HF -spoke with her nurse and will closely monitor her vitals -a TTE tmv -NPO for now  #Chronic systolic HF # s/p Medtronic BiV ICD   - Volume mildly overloaded on exam, NYHA Class III - hold home Torsemide 40 mg bid tonight - hold 2.5 mg Metolazone 2 days/week (qTue + Thru)  - hold Spiro 25 mg daily  - Continue Jardiance 10 mg daily  - hold bisoprolol 5 mg daily.  # PAF (SCAF) s/p Watchman implant 08/21/21 - CHA2DS2-VASc 4. - Unable to complete ablation.  - hold amiodarone 200 mg daily. 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 The patient's score is based upon: CHF History: 1 HTN History: 0 Diabetes History: 0 Stroke History: 0 Vascular Disease History: 0 Age Score: 1 Gender Score: 1    Severity of Illness: The appropriate patient status for this patient is INPATIENT. Inpatient status is judged to be reasonable and necessary in order to provide the required intensity of service to ensure the patient's safety. The patient's presenting symptoms, physical exam findings, and initial radiographic and laboratory data in the context of their chronic comorbidities is felt to place them at high risk for further clinical deterioration. Furthermore, it is not anticipated that the patient will be medically stable for discharge from the hospital within 2 midnights of admission.   * I certify that at the point of admission it is my clinical judgment that the patient will require inpatient hospital care spanning beyond 2 midnights from the point of admission due to high intensity of service, high risk for further deterioration and high frequency of surveillance required.*   For questions or updates, please contact [place] Please consult [email or web address] for contact info under     Signed, Laurice Record, MD  10/20/2021 9:08 PM

## 2021-10-28 ENCOUNTER — Inpatient Hospital Stay (HOSPITAL_COMMUNITY): Payer: Medicare PPO

## 2021-10-28 DIAGNOSIS — I5022 Chronic systolic (congestive) heart failure: Secondary | ICD-10-CM

## 2021-10-28 DIAGNOSIS — I480 Paroxysmal atrial fibrillation: Secondary | ICD-10-CM | POA: Diagnosis not present

## 2021-10-28 DIAGNOSIS — I3139 Other pericardial effusion (noninflammatory): Secondary | ICD-10-CM

## 2021-10-28 DIAGNOSIS — N179 Acute kidney failure, unspecified: Secondary | ICD-10-CM | POA: Diagnosis not present

## 2021-10-28 DIAGNOSIS — N1832 Chronic kidney disease, stage 3b: Secondary | ICD-10-CM

## 2021-10-28 LAB — ECHOCARDIOGRAM COMPLETE
Area-P 1/2: 3.37 cm2
Height: 62 in
S' Lateral: 3.2 cm
Weight: 3044.11 oz

## 2021-10-28 LAB — CBC
HCT: 31.9 % — ABNORMAL LOW (ref 36.0–46.0)
Hemoglobin: 9.6 g/dL — ABNORMAL LOW (ref 12.0–15.0)
MCH: 28.3 pg (ref 26.0–34.0)
MCHC: 30.1 g/dL (ref 30.0–36.0)
MCV: 94.1 fL (ref 80.0–100.0)
Platelets: 530 10*3/uL — ABNORMAL HIGH (ref 150–400)
RBC: 3.39 MIL/uL — ABNORMAL LOW (ref 3.87–5.11)
RDW: 18.1 % — ABNORMAL HIGH (ref 11.5–15.5)
WBC: 16.7 10*3/uL — ABNORMAL HIGH (ref 4.0–10.5)
nRBC: 0.2 % (ref 0.0–0.2)

## 2021-10-28 LAB — BASIC METABOLIC PANEL
Anion gap: 10 (ref 5–15)
BUN: 38 mg/dL — ABNORMAL HIGH (ref 8–23)
CO2: 24 mmol/L (ref 22–32)
Calcium: 8.3 mg/dL — ABNORMAL LOW (ref 8.9–10.3)
Chloride: 99 mmol/L (ref 98–111)
Creatinine, Ser: 2.15 mg/dL — ABNORMAL HIGH (ref 0.44–1.00)
GFR, Estimated: 25 mL/min — ABNORMAL LOW (ref >60)
Glucose, Bld: 174 mg/dL — ABNORMAL HIGH (ref 70–99)
Potassium: 3.3 mmol/L — ABNORMAL LOW (ref 3.5–5.1)
Sodium: 133 mmol/L — ABNORMAL LOW (ref 135–145)

## 2021-10-28 LAB — TROPONIN I (HIGH SENSITIVITY): Troponin I (High Sensitivity): 11 ng/L (ref <18)

## 2021-10-28 LAB — LIPID PANEL
Cholesterol: 130 mg/dL (ref 0–200)
HDL: 27 mg/dL — ABNORMAL LOW (ref >40)
LDL Cholesterol: 72 mg/dL (ref 0–99)
Total CHOL/HDL Ratio: 4.8 RATIO
Triglycerides: 153 mg/dL — ABNORMAL HIGH (ref <150)
VLDL: 31 mg/dL (ref 0–40)

## 2021-10-28 LAB — PREPARE RBC (CROSSMATCH)

## 2021-10-28 LAB — HEMOGLOBIN AND HEMATOCRIT, BLOOD
HCT: 32.1 % — ABNORMAL LOW (ref 36.0–46.0)
Hemoglobin: 9.8 g/dL — ABNORMAL LOW (ref 12.0–15.0)

## 2021-10-28 LAB — HIV ANTIBODY (ROUTINE TESTING W REFLEX): HIV Screen 4th Generation wRfx: NONREACTIVE

## 2021-10-28 LAB — OCCULT BLOOD X 1 CARD TO LAB, STOOL: Fecal Occult Bld: POSITIVE — AB

## 2021-10-28 MED ORDER — CHLORHEXIDINE GLUCONATE CLOTH 2 % EX PADS
6.0000 | MEDICATED_PAD | Freq: Every day | CUTANEOUS | Status: DC
  Administered 2021-10-28 – 2021-11-03 (×6): 6 via TOPICAL

## 2021-10-28 MED ORDER — SODIUM CHLORIDE 0.9% IV SOLUTION: Freq: Once | INTRAVENOUS | Status: AC

## 2021-10-28 MED ORDER — POTASSIUM CHLORIDE CRYS ER 20 MEQ PO TBCR
40.0000 meq | EXTENDED_RELEASE_TABLET | Freq: Once | ORAL | Status: AC
  Administered 2021-10-28: 40 meq via ORAL
  Filled 2021-10-28: qty 2

## 2021-10-28 NOTE — H&P (View-Only) (Signed)
Referring Provider: Cardiology PCP: Natasha Chalet, NP  Gastroenterologist: [name]  Reason for consultation:     GI bleed              ASSESSMENT / PLAN   #  66 yo female with  painless hematochezia on plavix. Presumably lower. Hgb down from baseline of 11-12 in January 2023 to 8.8.            --Hgb up to 9.6 post 1 u PRBC this am.  --Will likely need colonoscopy , +/- EGD after Plavix washout and when medically stable from cardiac standpoint. Sounds like her last colonoscopy was 10 + years ago --For now, bleeding has stopped  # Pericardial effusion. Cardiology following. Bedside TEE with moderate pericardial effusion but no echo signs of tamponade.   # Chronic systolic heart failure . Felt to have LBBB CM vs chemo induced ( Adriamycin for breast ca).  --TEE 01/23: EF 45-50% --Has AKI on CKD . Diuretics on hold  # PAF, s/p Watchman implant Dec 2022  # Additional medical history listed below.   HISTORY OF PRESENT ILLNESS                                                                                                                         Chief Complaint:  blood in stool  [name] is a 66 y.o. female with a past medical history significant for PAF with failed ablation s/p Watchman implant Dec 2022, non-ischemic cardiomyopathy , chronic systolic heart failure, ICD placement, HTN, OSA, DM, CKD 3, breast cancer ( remote) s/p left breast surgery and chemotherapy, See PMH for any additional medical problems.   Patient presented to [facility] ED in [facility] yesterday for blood in stool.  CT scan without contrast showed distal diverticulosis but also a large pericardial effusion. She was transferred to [facility].  She began having hematochezia about 4 days ago. She was averaging ~ 3 BMs a day which is more frequent than her norm. The blood and stools were red but eventually toward the end turned dark. She hasn't had any BMs or bleeding since yesterday. She had no  associated abdominal pain. No nausea / vomiting. No NSAID use. Last Plavix was on Sunday 2/19. No previous history of GI bleeding. Her last colonoscopy was 10 + years ago in [place]. She reports a negative Cologuard ~ 2 years ago. No [place] of colon cancer.   [name] has a history of GERD of ~ 7 years duration. She is asymptomatic on daily Pantoprazole. To her knowledge she has never had an EGD   IMAGING:  DG CHEST PORT 1 VIEW  Result Date: 10/28/2021 CLINICAL DATA:  Central line placement. EXAM: PORTABLE CHEST 1 VIEW COMPARISON:  PA Lat 08/21/2021. FINDINGS: Right IJ central line is in position with tip in the right atrium. There is no pneumothorax. Right chest dual lead pacing system with AID wiring and wire insertions are similar. The heart again noted enlarged but  the central vessels are normal caliber. Since the prior study a left atrial appendage occlusion device has been inserted. The lungs are generally clear. No significant pleural effusion is evident. Overlying metallic structures noted on the right inferiorly. Multiple overlying monitor leads. IMPRESSION: 1. Right IJ central line tip in the right atrium. 2. Cardiomegaly, with normal caliber central vessels. 3. Interval insertion of a left atrial appendage occlusion device. Electronically Signed   By: Telford Nab M.D.   On: 10/28/2021 01:24   ECHOCARDIOGRAM COMPLETE  Result Date: 10/28/2021    ECHOCARDIOGRAM REPORT   Patient Name:   [name] Date of Exam: 10/28/2021 Medical Rec #:  [id number]            Height:       62.0 in Accession #:    [id number]           Weight:       190.3 lb Date of Birth:  02-Nov-1955            BSA:          1.872 m Patient Age:    43 years             BP:           89/75 mmHg Patient Gender: F                    HR:           73 bpm. Exam Location:  Inpatient Procedure: 2D Echo STAT ECHO Indications:    Pericardial effusion  History:        Patient has prior history of Echocardiogram examinations, most                  recent 06/30/2021. CHF, Signs/Symptoms:Shortness of Breath; Risk                 Factors:Dyslipidemia.  Sonographer:    Arlyss Gandy Referring Phys: [id number] FAN YE IMPRESSIONS  1. Left ventricular ejection fraction, by estimation, is 35 to 40%. The left ventricle has moderately decreased function. The left ventricle demonstrates global hypokinesis. Left ventricular diastolic parameters are consistent with Grade II diastolic dysfunction (pseudonormalization). Elevated left atrial pressure. There is the interventricular septum is flattened in diastole ('D' shaped left ventricle), consistent with right ventricular volume overload.  2. Right ventricular systolic function is low normal. The right ventricular size is severely enlarged. A is visualized in the coronary sinus. There is normal pulmonary artery systolic pressure.  3. Left atrial size was mildly dilated.  4. Right atrial size was severely dilated.  5. Moderate pericardial effusion. The pericardial effusion is circumferential. There is no evidence of cardiac tamponade.  6. The mitral valve is myxomatous. Moderate mitral valve regurgitation.  7. Flail posterior tricuspid valve leaflet. The tricuspid valve is abnormal. Tricuspid valve regurgitation is severe.  8. The aortic valve is tricuspid. Aortic valve regurgitation is not visualized. Aortic valve sclerosis is present, with no evidence of aortic valve stenosis.  9. The inferior vena cava is dilated in size with <50% respiratory variability, suggesting right atrial pressure of 15 mmHg. 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 This may allow equalization of atrial pressures and mask respiratory flow variation as a sign of tamponade. Right heart volume overload may reduce the occurence of right heart chamber collapse. Serial studies are recommended.  FINDINGS  Left Ventricle: Left ventricular ejection fraction, by estimation, is 35 to 40%. The left ventricle has moderately decreased function. The left ventricle demonstrates global hypokinesis. The left ventricular internal cavity size was normal in size. There is no left ventricular hypertrophy. The interventricular septum is flattened in diastole ('D' shaped left ventricle), consistent with right ventricular volume overload. Left ventricular diastolic parameters are consistent with Grade II diastolic dysfunction (pseudonormalization). Elevated left atrial pressure. Right Ventricle: The right ventricular size is severely enlarged. No increase in right ventricular wall thickness. Right ventricular systolic function is low normal. There is normal pulmonary artery systolic pressure. The tricuspid regurgitant velocity is 2.10 m/s, and with an assumed right atrial pressure of 15 mmHg, the estimated right ventricular systolic pressure is 56.8 mmHg. Left Atrium: Left atrial size was mildly dilated. Right Atrium: Right atrial size was severely dilated. Pericardium: A moderately sized pericardial effusion is present. The pericardial effusion is circumferential. There is no evidence of cardiac tamponade. Mitral Valve: The mitral valve is myxomatous. Moderate mitral valve regurgitation, with eccentric posteriorly directed jet. Tricuspid Valve: Flail posterior tricuspid valve leaflet. The tricuspid valve is abnormal. Tricuspid valve regurgitation is severe. Aortic Valve: The aortic valve is tricuspid. Aortic valve regurgitation is not visualized. Aortic valve sclerosis is present, with no evidence of aortic valve stenosis. Pulmonic Valve: The pulmonic valve was normal in structure. Pulmonic valve regurgitation is trivial. Aorta: The aortic root and ascending aorta are structurally normal, with no evidence of dilitation. Venous: The inferior vena cava is dilated in size with less than 50% respiratory variability, suggesting  right atrial pressure of 15 mmHg. IAS/Shunts: The interatrial septum was not well visualized. Additional Comments: A device lead is visualized in the right ventricle, right atrium and coronary sinus.  LEFT VENTRICLE PLAX 2D LVIDd:         3.70 cm   Diastology LVIDs:         3.20 cm   LV e' medial:    7.62 cm/s LV PW:         0.60 cm   LV E/e' medial:  13.9 LV IVS:        0.60 cm   LV e' lateral:   6.85 cm/s LVOT diam:     1.90 cm   LV E/e' lateral: 15.5 LV SV:         44 LV SV Index:   24 LVOT Area:     2.84 cm  RIGHT VENTRICLE             IVC RV Basal diam:  4.00 cm     IVC diam: 3.10 cm RV Mid diam:    3.40 cm RV S prime:     11.70 cm/s TAPSE (M-mode): 1.6 cm LEFT ATRIUM             Index        RIGHT ATRIUM           Index LA diam:        3.80 cm 2.03 cm/m   RA Area:     24.20 cm LA Vol (A2C):   54.3 ml 29.01 ml/m  RA Volume:   78.90 ml  42.16 ml/m LA Vol (A4C):   49.6 ml 26.50 ml/m LA Biplane Vol: 53.9 ml 28.80 ml/m  AORTIC VALVE LVOT Vmax:   93.50 cm/s LVOT Vmean:  53.600 cm/s LVOT VTI:    0.156 m  AORTA Ao Root diam: 2.40 cm Ao Asc diam:  2.50 cm MITRAL VALVE  TRICUSPID VALVE MV Area (PHT): 3.37 cm     TR Peak grad:   17.6 mmHg MV Decel Time: 225 msec     TR Vmax:        210.00 cm/s MV E velocity: 106.00 cm/s MV A velocity: 56.00 cm/s   SHUNTS MV E/A ratio:  1.89         Systemic VTI:  0.16 m                             Systemic Diam: 1.90 cm Mihai Croitoru MD Electronically signed by Sanda Klein MD Signature Date/Time: 10/28/2021/10:10:43 AM    Final       Past Medical History:  Diagnosis Date   AICD (automatic cardioverter/defibrillator) present    Biventricular implantable cardiac defibrillator)    Medtronic   Breast cancer ([place]) 1998   - left   CHF (congestive heart failure) ([place])    takes Lasix and Aldactone daily   Chronic kidney disease    Complication of anesthesia    n/v, none at last SURGERY-2022.   GERD (gastroesophageal reflux disease)    takes Omeprazole  daily   Gout    takes Allopurinol daily   History of bronchitis 2 yrs ago   History of shingles    Hyperlipidemia    takes Simvastatin daily   Insomnia    takes Ambien nightly   Nonischemic cardiomyopathy ([place])    EF 55% echo 2012   NSVT (nonsustained ventricular tachycardia)    Obesity    OSA (obstructive sleep apnea)    Unable to wear CPAP   Pneumonia 2005   Presence of permanent cardiac pacemaker    Shortness of breath dyspnea    with exertion   sprint Fidelis     Past Surgical History:  Procedure Laterality Date   ABDOMINAL HYSTERECTOMY     APPENDECTOMY  09/07/1976   ATRIAL FIBRILLATION ABLATION N/A 06/30/2021   Procedure: ATRIAL FIBRILLATION ABLATION;  Surgeon: Vickie Epley, MD;  Location: [place] [facility];  Service: Cardiovascular;  Laterality: N/A;   BiV ICD  09/07/2005   Medtronic   BIV ICD GENERTAOR CHANGE OUT N/A 12/16/2011   Procedure: BIV ICD GENERTAOR CHANGE OUT;  Surgeon: [name], MD;  Location: [facility] C[facility] LAB;  Service: Cardiovascular;  Laterality: N/A;   CHOLECYSTECTOMY  09/07/2017   COLONOSCOPY     cyst removed from wrist Left    early 70's   ICD LEAD REMOVAL Right 10/18/2014   Procedure: ICD LEAD REMOVAL/EXTRACTION/REIMPLANTATION;  Surgeon: Evans Lance, MD;  Location: [place];  Service: Cardiovascular;  Laterality: Right;   KYPHOPLASTY N/A 06/06/2021   Procedure: KYPHOPLASTY LUMBAR ONE;  Surgeon: Karsten Ro, DO;  Location: [place];  Service: Neurosurgery;  Laterality: N/A;   LEFT ATRIAL APPENDAGE OCCLUSION N/A 08/21/2021   Procedure: LEFT ATRIAL APPENDAGE OCCLUSION;  Surgeon: Vickie Epley, MD;  Location: [place] [facility];  Service: Cardiovascular;  Laterality: N/A;   MASTECTOMY Left 09/07/1996   port a cath placed  09/07/1996   port removed  09/07/1996   RIGHT HEART CATH N/A 06/20/2018   Procedure: RIGHT HEART CATH;  Surgeon: Jolaine Artist, MD;  Location: [place] [facility];  Service: Cardiovascular;  Laterality: N/A;   TEE  WITHOUT CARDIOVERSION N/A 08/21/2021   Procedure: TRANSESOPHAGEAL ECHOCARDIOGRAM (TEE);  Surgeon: Vickie Epley, MD;  Location: [place] [facility];  Service: Cardiovascular;  Laterality: N/A;   TEE  WITHOUT CARDIOVERSION N/A 10/07/2021   Procedure: TRANSESOPHAGEAL ECHOCARDIOGRAM (TEE);  Surgeon: Sanda Klein, MD;  Location: [place];  Service: Cardiovascular;  Laterality: N/A;    Prior to Admission medications   Medication Sig Start Date End Date Taking? Authorizing Provider  alendronate (FOSAMAX) 70 MG tablet Take 70 mg by mouth every Monday.    [provider]  allopurinol (ZYLOPRIM) 100 MG tablet Take 100 mg by mouth daily in the afternoon. 03/18/18   [provider]  amiodarone (PACERONE) 200 MG tablet Take 200 mg by mouth daily.    [provider]  atorvastatin (LIPITOR) 10 MG tablet Take 10 mg by mouth in the morning.    [provider]  bisoprolol (ZEBETA) 5 MG tablet Take 1 tablet (5 mg total) by mouth daily. 07/16/21   Rafael Bihari, FNP  buPROPion (WELLBUTRIN SR) 150 MG 12 hr tablet Take 150 mg by mouth 2 (two) times daily. 04/18/21   [provider]  Cholecalciferol (VITAMIN D3) 5000 units TABS Take 5,000 Units by mouth in the morning.    [provider]  clopidogrel (PLAVIX) 75 MG tablet Take 1 tablet (75 mg total) by mouth daily. 10/07/21 04/05/22  Tommie Raymond, NP  Cyanocobalamin (VITAMIN B-12) 5000 MCG SUBL Place 5,000 mcg under the tongue in the morning.    [provider]  JARDIANCE 10 MG TABS tablet TAKE 1 TABLET BY MOUTH DAILY BEFORE BREAKFAST. 10/21/21   Larey Dresser, MD  metolazone (ZAROXOLYN) 2.5 MG tablet Take 2.5 mg by mouth 2 (two) times a week. Tuesdays and Thursdays.    [provider]  Multiple Vitamin (MULTIVITAMIN WITH MINERALS) TABS tablet Take 1 tablet by mouth in the morning. One-A-Day for Women 50+    [provider]  pantoprazole (PROTONIX) 40 MG tablet Take 1  tablet (40 mg total) by mouth daily. 08/22/21   Kathyrn Drown D, NP  potassium chloride SA (KLOR-CON M) 20 MEQ tablet Take 40 mEq by mouth 3 (three) times daily.    [provider]  spironolactone (ALDACTONE) 25 MG tablet Take 25 mg by mouth daily in the afternoon.    [provider]  torsemide (DEMADEX) 20 MG tablet Take 2 tablets (40 mg total) by mouth 2 (two) times daily. 10/16/21   Bensimhon, Shaune Pascal, MD  venlafaxine XR (EFFEXOR-XR) 150 MG 24 hr capsule Take 150 mg by mouth daily with breakfast.    [provider]  zolpidem (AMBIEN) 10 MG tablet Take 10 mg by mouth at bedtime.    [provider]    Current Facility-Administered Medications  Medication Dose Route Frequency Provider Last Rate Last Admin   0.9 %  sodium chloride infusion   Intravenous Continuous Laurice Record, MD 100 mL/hr at 10/28/21 1000 Infusion Verify at 10/28/21 1000   acetaminophen (TYLENOL) tablet 650 mg  650 mg Oral Q4H PRN Laurice Record, MD       allopurinol (ZYLOPRIM) tablet 100 mg  100 mg Oral Q1500 Laurice Record, MD       atorvastatin (LIPITOR) tablet 10 mg  10 mg Oral q AM Laurice Record, MD   10 mg at 10/28/21 7939   buPROPion [facility] SR) 12 hr tablet 150 mg  150 mg Oral BID Laurice Record, MD   150 mg at 10/28/21 0300   Chlorhexidine Gluconate Cloth 2 % PADS 6 each  6 each Topical Daily Bensimhon, Shaune Pascal, MD       cholecalciferol (VITAMIN D3) tablet 5,000 Units  5,000  Units Oral q AM Laurice Record, MD   5,000 Units at 10/28/21 5997   multivitamin with minerals tablet 1 tablet  1 tablet Oral Daily Laurice Record, MD   1 tablet at 10/28/21 0947   nitroGLYCERIN (NITROSTAT) SL tablet 0.4 mg  0.4 mg Sublingual Q5 Min x 3 PRN Laurice Record, MD       norepinephrine (LEVOPHED) 4mg  in 288mL (0.016 mg/mL) premix infusion  0-40 mcg/min Intravenous Titrated Laurice Record, MD 45 mL/hr at 10/28/21 1000 12 mcg/min at 10/28/21 1000   ondansetron (ZOFRAN) injection 4 mg  4 mg Intravenous Q6H PRN Laurice Record, MD       pantoprazole (PROTONIX) EC  tablet 40 mg  40 mg Oral Daily Laurice Record, MD   40 mg at 10/28/21 0947   potassium chloride SA (KLOR-CON M) CR tablet 40 mEq  40 mEq Oral TID Laurice Record, MD   40 mEq at 10/28/21 0947   venlafaxine XR (EFFEXOR-XR) 24 hr capsule 150 mg  150 mg Oral Q breakfast Laurice Record, MD   150 mg at 10/28/21 0859   zolpidem (AMBIEN) tablet 5 mg  5 mg Oral QHS Laurice Record, MD   5 mg at 11/01/2021 2155    Allergies as of 10/15/2021 - Review Complete 10/10/2021  Allergen Reaction Noted   Buprenorphine hcl  07/25/2015   Morphine and related Other (See Comments) 07/04/2015   Percocet [oxycodone-acetaminophen] Shortness Of Breath 07/25/2015   Oxycodone Other (See Comments) 07/25/2015    Family History  Problem Relation Age of Onset   Diabetes Mother    Heart disease Father    Alzheimer's disease Father     Social History   Socioeconomic History   Marital status: Married    Spouse name: Not on file   Number of children: Not on file   Years of education: Not on file   Highest education level: Not on file  Occupational History   Not on file  Tobacco Use   Smoking status: Never   Smokeless tobacco: Never  Vaping Use   Vaping Use: Never used  Substance and Sexual Activity   Alcohol use: No   Drug use: No   Sexual activity: Yes    Birth control/protection: None  Other Topics Concern   Not on file  Social History Narrative   Not on file   Social Determinants of Health   Financial Resource Strain: Not on file  Food Insecurity: Not on file  Transportation Needs: Not on file  Physical Activity: Not on file  Stress: Not on file  Social Connections: Not on file  Intimate Partner Violence: Not on file    Review of Systems: Positive for fatigue, dyspnea. All other systems reviewed and negative except where noted in HPI.   OBJECTIVE    Physical Exam: Vital signs in last 24 hours: Temp:  [97.9 F (36.6 C)-98.4 F (36.9 C)] 98.2 F (36.8 C) (02/21 1111) Pulse Rate:  [66-120] 69 (02/21 1000) Resp:   [15-34] 18 (02/21 1000) BP: (74-136)/(25-117) 84/38 (02/21 0950) SpO2:  [87 %-100 %] 94 % (02/21 1000) Weight:  [86.3 kg] 86.3 kg (02/20 2124) Last BM Date : 10/30/2021  General:  Alert female in NAD Psych:  Pleasant, cooperative. Normal mood and affect Eyes: Pupils equal, no icterus. Conjunctive pink Ears:  Normal auditory acuity Nose: No deformity, discharge or lesions Neck:  Supple, no masses felt Lungs:  Clear to auscultation.  Heart:  Regular rate. No lower extremity edema Abdomen:  Soft, nondistended, nontender,  active bowel sounds, no masses felt Rectal :  Deferred Msk: Symmetrical without gross deformities.  Neurologic:  Alert, oriented, grossly normal neurologically Skin:  Intact without significant lesions.    Scheduled inpatient medications  allopurinol  100 mg Oral Q1500   atorvastatin  10 mg Oral q AM   buPROPion  150 mg Oral BID   Chlorhexidine Gluconate Cloth  6 each Topical Daily   cholecalciferol  5,000 Units Oral q AM   multivitamin with minerals  1 tablet Oral Daily   pantoprazole  40 mg Oral Daily   potassium chloride SA  40 mEq Oral TID   venlafaxine XR  150 mg Oral Q breakfast   zolpidem  5 mg Oral QHS      Intake/Output from previous day: 02/20 0701 - 02/21 0700 In: 1689 [P.O.:120; I.V.:1254; Blood:315] Out: -  Intake/Output this shift: Total I/O In: 615.6 [I.V.:615.6] Out: 200 [Urine:200]   Lab Results: Recent Labs    10/10/2021 2138 10/28/21 0513 10/28/21 0821  WBC 13.8*  --  16.7*  HGB 8.8* 9.8* 9.6*  HCT 28.2* 32.1* 31.9*  PLT 534*  --  530*   BMET Recent Labs    10/22/2021 2138 10/28/21 0821  NA 131* 133*  K 3.1* 3.3*  CL 94* 99  CO2 24 24  GLUCOSE 127* 174*  BUN 47* 38*  CREATININE 2.78* 2.15*  CALCIUM 8.4* 8.3*   LFTs Recent Labs    10/28/2021 2138  PROT 5.9*  ALBUMIN 2.6*  AST 33  ALT 25  ALKPHOS 111  BILITOT 0.9   PT/INR No results for input(s): LABPROT, INR in the last 72 hours. Hepatitis Panel No results  for input(s): HEPBSAG, HCVAB, HEPAIGM, HEPBIGM in the last 72 hours.   . CBC Latest Ref Rng & Units 10/28/2021 10/28/2021 10/14/2021  WBC 4.0 - 10.5 K/uL 16.7(H) - 13.8(H)  Hemoglobin 12.0 - 15.0 g/dL 9.6(L) 9.8(L) 8.8(L)  Hematocrit 36.0 - 46.0 % 31.9(L) 32.1(L) 28.2(L)  Platelets 150 - 400 K/uL 530(H) - 534(H)    . CMP Latest Ref Rng & Units 10/28/2021 10/23/2021 10/16/2021  Glucose 70 - 99 mg/dL 174(H) 127(H) 116(H)  BUN 8 - 23 mg/dL 38(H) 47(H) 22  Creatinine 0.44 - 1.00 mg/dL 2.15(H) 2.78(H) 1.83(H)  Sodium 135 - 145 mmol/L 133(L) 131(L) 138  Potassium 3.5 - 5.1 mmol/L 3.3(L) 3.1(L) 4.0  Chloride 98 - 111 mmol/L 99 94(L) 105  CO2 22 - 32 mmol/L 24 24 23   Calcium 8.9 - 10.3 mg/dL 8.3(L) 8.4(L) 9.2  Total Protein 6.5 - 8.1 g/dL - 5.9(L) -  Total Bilirubin 0.3 - 1.2 mg/dL - 0.9 -  Alkaline Phos 38 - 126 U/L - 111 -  AST 15 - 41 U/L - 33 -  ALT 0 - 44 U/L - 25 -     Principal Problem:   Pericardial effusion    Tye Savoy, NP-C @  10/28/2021, 11:19 AM   Attending physician's note   I have taken history, reviewed the chart and examined the patient. I performed a substantive portion of this encounter, including complete performance of at least one of the key components, in conjunction with the APP. I agree with the Advanced Practitioner's note, impression and recommendations.   GI bleed- likely lower, but H/O ?melena is concerning. Hb 12 (09/2021) to 8.8 s/p1U to 9.6. No active bleeding. CT AP without contrast showed div and pericardial effusion.  Pericardial effusion- No tamponade on last TEE. To be re-eval on 2/23. ?etiology  CHF (EF  45-50%), PAF, CKD, ICD placement- on plavix (last dose 2/19)  Plan: -EGD/colon on 2/24 (Friday) after plavix washout and if OK with cardiology. -Certainly earlier if any active bleeding. -Continue protonix -Trend CBC   Carmell Austria, MD [facility] [phone]

## 2021-10-28 NOTE — Consult Note (Signed)
ELECTROPHYSIOLOGY CONSULT NOTE    Patient ID: [name] MRN: [id number], DOB/AGE: 1956-05-23 66 y.o.  Admit date: 10/10/2021 Date of Consult: 10/28/2021  Primary Physician: Wayna Chalet, NP Primary Cardiologist: None  Electrophysiologist: Dr. Quentin Ore  Referring Provider: Dr. Haroldine Laws   Patient Profile: [name] is a 66 y.o. female with a history of paroxymal atrial fibrillation with recent failed ablation S/p Watchman implant 08/21/21,  breast CA in 1998 s/p left breast surgery and s/p Adriamycin x 4 cycles, non-ischemic cardiomyopathy/chronic systolic CHF with prior EF now at 45-50% (TEE 10/07/21), s/p BiV Medtronic ICD, HTN, OSA, and CKD Stage IIIb who is being seen today for the evaluation of pericardial effusion at the request of Dr. Haroldine Laws.  HPI:  [name] is a 66 y.o. female with medical history as above.   Presented to [facility] ER 10/25/2021 for BRBPR, she was also found to have a large pericardial effusion by CT and transferred to [facility] for further evaluation and treatment.   Of note, TEE 10/07/2021 reported elevated RV pressures, but no evidence of pericardial perfusion.   Pertinent work up from outside hospital: K 3.4, Cr 3.16, Hgb 8.9, WBC 13.9 CT reported a new moderate to large pericardial effusion, colonic diverticulosis without CT findings of acute diverticulitis   BP low requiring pressor support. This am on NE 18. Did receive 1 unit PRBCs overnight. Has not yet had recurrent BRBPR. Denies dyspnea at rest.   Echo today shows EF 35-40% RV ok. Moderate posterior effusion. IVC dilated with mild collapse on sniff. No RA/RV collapse. No mitral inflow resp variation.   Currently sitting up in bed. No dyspnea at rest.   Past Medical History:  Diagnosis Date   AICD (automatic cardioverter/defibrillator) present    Biventricular implantable cardiac defibrillator)    Medtronic   Breast cancer ([place]) 1998   - left   CHF  (congestive heart failure) (HCC)    takes Lasix and Aldactone daily   Chronic kidney disease    Complication of anesthesia    n/v, none at last SURGERY-2022.   GERD (gastroesophageal reflux disease)    takes Omeprazole daily   Gout    takes Allopurinol daily   History of bronchitis 2 yrs ago   History of shingles    Hyperlipidemia    takes Simvastatin daily   Insomnia    takes Ambien nightly   Nonischemic cardiomyopathy ([place])    EF 55% echo 2012   NSVT (nonsustained ventricular tachycardia)    Obesity    OSA (obstructive sleep apnea)    Unable to wear CPAP   Pneumonia 2005   Presence of permanent cardiac pacemaker    Shortness of breath dyspnea    with exertion   sprint Fidelis      Surgical History:  Past Surgical History:  Procedure Laterality Date   ABDOMINAL HYSTERECTOMY     APPENDECTOMY  09/07/1976   ATRIAL FIBRILLATION ABLATION N/A 06/30/2021   Procedure: ATRIAL FIBRILLATION ABLATION;  Surgeon: Vickie Epley, MD;  Location: [place] [facility];  Service: Cardiovascular;  Laterality: N/A;   BiV ICD  09/07/2005   Medtronic   BIV ICD GENERTAOR CHANGE OUT N/A 12/16/2011   Procedure: BIV ICD GENERTAOR CHANGE OUT;  Surgeon: [name], MD;  Location: [facility] CATH LAB;  Service: Cardiovascular;  Laterality: N/A;   CHOLECYSTECTOMY  09/07/2017   COLONOSCOPY     cyst removed from wrist Left    early 70's   ICD LEAD  REMOVAL Right 10/18/2014   Procedure: ICD LEAD REMOVAL/EXTRACTION/REIMPLANTATION;  Surgeon: Evans Lance, MD;  Location: [place];  Service: Cardiovascular;  Laterality: Right;   KYPHOPLASTY N/A 06/06/2021   Procedure: KYPHOPLASTY LUMBAR ONE;  Surgeon: Karsten Ro, DO;  Location: [place];  Service: Neurosurgery;  Laterality: N/A;   LEFT ATRIAL APPENDAGE OCCLUSION N/A 08/21/2021   Procedure: LEFT ATRIAL APPENDAGE OCCLUSION;  Surgeon: Vickie Epley, MD;  Location: [place] [facility];  Service: Cardiovascular;  Laterality: N/A;   MASTECTOMY Left 09/07/1996    port a cath placed  09/07/1996   port removed  09/07/1996   RIGHT HEART CATH N/A 06/20/2018   Procedure: RIGHT HEART CATH;  Surgeon: Jolaine Artist, MD;  Location: [place] [facility];  Service: Cardiovascular;  Laterality: N/A;   TEE WITHOUT CARDIOVERSION N/A 08/21/2021   Procedure: TRANSESOPHAGEAL ECHOCARDIOGRAM (TEE);  Surgeon: Vickie Epley, MD;  Location: [place] [facility];  Service: Cardiovascular;  Laterality: N/A;   TEE WITHOUT CARDIOVERSION N/A 10/07/2021   Procedure: TRANSESOPHAGEAL ECHOCARDIOGRAM (TEE);  Surgeon: Sanda Klein, MD;  Location: [facility];  Service: Cardiovascular;  Laterality: N/A;     Medications Prior to Admission  Medication Sig Dispense Refill Last Dose   alendronate (FOSAMAX) 70 MG tablet Take 70 mg by mouth every Monday.      allopurinol (ZYLOPRIM) 100 MG tablet Take 100 mg by mouth daily in the afternoon.      amiodarone (PACERONE) 200 MG tablet Take 200 mg by mouth daily.      atorvastatin (LIPITOR) 10 MG tablet Take 10 mg by mouth in the morning.      bisoprolol (ZEBETA) 5 MG tablet Take 1 tablet (5 mg total) by mouth daily. 30 tablet 6    buPROPion (WELLBUTRIN SR) 150 MG 12 hr tablet Take 150 mg by mouth 2 (two) times daily.      Cholecalciferol (VITAMIN D3) 5000 units TABS Take 5,000 Units by mouth in the morning.      clopidogrel (PLAVIX) 75 MG tablet Take 1 tablet (75 mg total) by mouth daily. 90 tablet 1    Cyanocobalamin (VITAMIN B-12) 5000 MCG SUBL Place 5,000 mcg under the tongue in the morning.      JARDIANCE 10 MG TABS tablet TAKE 1 TABLET BY MOUTH DAILY BEFORE BREAKFAST. 30 tablet 11    metolazone (ZAROXOLYN) 2.5 MG tablet Take 2.5 mg by mouth 2 (two) times a week. Tuesdays and Thursdays.      Multiple Vitamin (MULTIVITAMIN WITH MINERALS) TABS tablet Take 1 tablet by mouth in the morning. One-A-Day for Women 50+      pantoprazole (PROTONIX) 40 MG tablet Take 1 tablet (40 mg total) by mouth daily. 90 tablet 3    potassium chloride  SA (KLOR-CON M) 20 MEQ tablet Take 40 mEq by mouth 3 (three) times daily.      spironolactone (ALDACTONE) 25 MG tablet Take 25 mg by mouth daily in the afternoon.      torsemide (DEMADEX) 20 MG tablet Take 2 tablets (40 mg total) by mouth 2 (two) times daily. 90 tablet 3    venlafaxine XR (EFFEXOR-XR) 150 MG 24 hr capsule Take 150 mg by mouth daily with breakfast.      zolpidem (AMBIEN) 10 MG tablet Take 10 mg by mouth at bedtime.       Inpatient Medications:   allopurinol  100 mg Oral Q1500   atorvastatin  10 mg Oral q AM   buPROPion  150 mg Oral BID  Chlorhexidine Gluconate Cloth  6 each Topical Daily   cholecalciferol  5,000 Units Oral q AM   multivitamin with minerals  1 tablet Oral Daily   pantoprazole  40 mg Oral Daily   potassium chloride SA  40 mEq Oral TID   venlafaxine XR  150 mg Oral Q breakfast   zolpidem  5 mg Oral QHS    Allergies:  Allergies  Allergen Reactions   Buprenorphine Hcl     Patient required emergent use of naloxone and naloxone drip in ICU for only taking Percocet 5-325 over the course of one day. She appears to have impaired metabolism of opioid medications and providers should use these medications with extreme caution.   Morphine And Related Other (See Comments)    Patient required emergent use of naloxone and naloxone drip in ICU for only taking Percocet 5-325 over the course of one day. She appears to have impaired metabolism of opioid medications and providers should use these medications with extreme caution.   Percocet [Oxycodone-Acetaminophen] Shortness Of Breath    Hospitalized   Oxycodone Other (See Comments)    Mental status changes    Social History   Socioeconomic History   Marital status: Married    Spouse name: Not on file   Number of children: Not on file   Years of education: Not on file   Highest education level: Not on file  Occupational History   Not on file  Tobacco Use   Smoking status: Never   Smokeless tobacco: Never   Vaping Use   Vaping Use: Never used  Substance and Sexual Activity   Alcohol use: No   Drug use: No   Sexual activity: Yes    Birth control/protection: None  Other Topics Concern   Not on file  Social History Narrative   Not on file   Social Determinants of Health   Financial Resource Strain: Not on file  Food Insecurity: Not on file  Transportation Needs: Not on file  Physical Activity: Not on file  Stress: Not on file  Social Connections: Not on file  Intimate Partner Violence: Not on file     Family History  Problem Relation Age of Onset   Diabetes Mother    Heart disease Father    Alzheimer's disease Father      Review of Systems: All other systems reviewed and are otherwise negative except as noted above.  Physical Exam: Vitals:   10/28/21 0940 10/28/21 0950 10/28/21 1000 10/28/21 1111  BP:  (!) 84/38    Pulse: 66 68 69   Resp: (!) 22 (!) 23 18   Temp:    98.2 F (36.8 C)  TempSrc:    Oral  SpO2: 99% 94% 94%   Weight:      Height:        GEN- The patient is well appearing, alert and oriented x 3 today.   HEENT: normocephalic, atraumatic; sclera clear, conjunctiva pink; hearing intact; oropharynx clear; neck supple Lungs- Clear to ausculation bilaterally, normal work of breathing.  No wheezes, rales, rhonchi Heart- Regular rate and rhythm, no murmurs, rubs or gallops GI- soft, non-tender, non-distended, bowel sounds present Extremities- no clubbing, cyanosis, or edema; DP/PT/radial pulses 2+ bilaterally MS- no significant deformity or atrophy Skin- warm and dry, no rash or lesion Psych- euthymic mood, full affect Neuro- strength and sensation are intact  Labs:   Lab Results  Component Value Date   WBC 16.7 (H) 10/28/2021   HGB 9.6 (L) 10/28/2021  HCT 31.9 (L) 10/28/2021   MCV 94.1 10/28/2021   PLT 530 (H) 10/28/2021    Recent Labs  Lab 10/17/2021 2138 10/28/21 0821  NA 131* 133*  K 3.1* 3.3*  CL 94* 99  CO2 24 24  BUN 47* 38*   CREATININE 2.78* 2.15*  CALCIUM 8.4* 8.3*  PROT 5.9*  --   BILITOT 0.9  --   ALKPHOS 111  --   ALT 25  --   AST 33  --   GLUCOSE 127* 174*      Radiology/Studies: US RENAL  Result Date: 10/21/2021 CLINICAL DATA:  Stage 2 chronic kidney disease.  Hypertension. EXAM: RENAL / URINARY TRACT ULTRASOUND COMPLETE COMPARISON:  None. FINDINGS: Right Kidney: Renal measurements: 8.5 x 3.7 x 4.4 cm = volume: 73 mL. Cortical thinning without hydronephrosis. Left Kidney: Renal measurements: 8.8 x 4.9 x 4.5 cm = volume: 102 mL. Mild cortical thinning without hydronephrosis. Bladder: Appears normal for degree of bladder distention. Other: None. IMPRESSION: Bilateral renal cortical thinning without hydronephrosis. Electronically Signed   By: Misty Stanley M.D.   On: 10/21/2021 14:20   DG CHEST PORT 1 VIEW  Result Date: 10/28/2021 CLINICAL DATA:  Central line placement. EXAM: PORTABLE CHEST 1 VIEW COMPARISON:  PA Lat 08/21/2021. FINDINGS: Right IJ central line is in position with tip in the right atrium. There is no pneumothorax. Right chest dual lead pacing system with AID wiring and wire insertions are similar. The heart again noted enlarged but the central vessels are normal caliber. Since the prior study a left atrial appendage occlusion device has been inserted. The lungs are generally clear. No significant pleural effusion is evident. Overlying metallic structures noted on the right inferiorly. Multiple overlying monitor leads. IMPRESSION: 1. Right IJ central line tip in the right atrium. 2. Cardiomegaly, with normal caliber central vessels. 3. Interval insertion of a left atrial appendage occlusion device. Electronically Signed   By: Telford Nab M.D.   On: 10/28/2021 01:24   ECHOCARDIOGRAM COMPLETE  Result Date: 10/28/2021    ECHOCARDIOGRAM REPORT   Patient Name:   [name] Date of Exam: 10/28/2021 Medical Rec #:  [id number]            Height:       62.0 in Accession #:    [id number]            Weight:       190.3 lb Date of Birth:  1955-11-02            BSA:          1.872 m Patient Age:    34 years             BP:           89/75 mmHg Patient Gender: F                    HR:           73 bpm. Exam Location:  Inpatient Procedure: 2D Echo STAT ECHO Indications:    Pericardial effusion  History:        Patient has prior history of Echocardiogram examinations, most                 recent 06/30/2021. CHF, Signs/Symptoms:Shortness of Breath; Risk                 Factors:Dyslipidemia.  Sonographer:    Arlyss Gandy Referring Phys: [id number] FAN YE IMPRESSIONS  1. Left ventricular  ejection fraction, by estimation, is 35 to 40%. The left ventricle has moderately decreased function. The left ventricle demonstrates global hypokinesis. Left ventricular diastolic parameters are consistent with Grade II diastolic dysfunction (pseudonormalization). Elevated left atrial pressure. There is the interventricular septum is flattened in diastole ('D' shaped left ventricle), consistent with right ventricular volume overload.  2. Right ventricular systolic function is low normal. The right ventricular size is severely enlarged. A is visualized in the coronary sinus. There is normal pulmonary artery systolic pressure.  3. Left atrial size was mildly dilated.  4. Right atrial size was severely dilated.  5. Moderate pericardial effusion. The pericardial effusion is circumferential. There is no evidence of cardiac tamponade.  6. The mitral valve is myxomatous. Moderate mitral valve regurgitation.  7. Flail posterior tricuspid valve leaflet. The tricuspid valve is abnormal. Tricuspid valve regurgitation is severe.  8. The aortic valve is tricuspid. Aortic valve regurgitation is not visualized. Aortic valve sclerosis is present, with no evidence of aortic valve stenosis.  9. The inferior vena cava is dilated in size with <50% respiratory variability, suggesting right atrial pressure of 15 mmHg. 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 This may allow equalization of atrial pressures and mask respiratory flow variation as a sign of tamponade. Right heart volume overload may reduce the occurence of right heart chamber collapse. Serial studies are recommended. FINDINGS  Left Ventricle: Left ventricular ejection fraction, by estimation, is 35 to 40%. The left ventricle has moderately decreased function. The left ventricle demonstrates global hypokinesis. The left ventricular internal cavity size was normal in size. There is no left ventricular hypertrophy. The interventricular septum is flattened in diastole ('D' shaped left ventricle), consistent with right ventricular volume overload. Left ventricular diastolic parameters are consistent with Grade II diastolic dysfunction (pseudonormalization). Elevated left atrial pressure. Right Ventricle: The right ventricular size is severely enlarged. No increase in right ventricular wall thickness. Right ventricular systolic function is low normal. There is normal pulmonary artery systolic pressure. The tricuspid regurgitant velocity is 2.10 m/s, and with an assumed right atrial pressure of 15 mmHg, the estimated right ventricular systolic pressure is 62.3 mmHg. Left Atrium: Left atrial size was mildly dilated. Right Atrium: Right atrial size was severely dilated. Pericardium: A moderately sized pericardial effusion is present. The pericardial effusion is circumferential. There is no evidence of cardiac tamponade. Mitral Valve: The mitral valve is myxomatous. Moderate mitral valve regurgitation, with eccentric posteriorly directed jet. Tricuspid Valve: Flail posterior tricuspid valve leaflet. The tricuspid valve is abnormal. Tricuspid valve regurgitation is severe. Aortic Valve: The aortic valve is tricuspid. Aortic valve regurgitation is not visualized. Aortic  valve sclerosis is present, with no evidence of aortic valve stenosis. Pulmonic Valve: The pulmonic valve was normal in structure. Pulmonic valve regurgitation is trivial. Aorta: The aortic root and ascending aorta are structurally normal, with no evidence of dilitation. Venous: The inferior vena cava is dilated in size with less than 50% respiratory variability, suggesting right atrial pressure of 15 mmHg. IAS/Shunts: The interatrial septum was not well visualized. Additional Comments: A device lead is visualized in the right ventricle, right atrium and coronary sinus.  LEFT VENTRICLE PLAX 2D LVIDd:         3.70 cm   Diastology LVIDs:         3.20 cm   LV e' medial:    7.62 cm/s LV PW:  0.60 cm   LV E/e' medial:  13.9 LV IVS:        0.60 cm   LV e' lateral:   6.85 cm/s LVOT diam:     1.90 cm   LV E/e' lateral: 15.5 LV SV:         44 LV SV Index:   24 LVOT Area:     2.84 cm  RIGHT VENTRICLE             IVC RV Basal diam:  4.00 cm     IVC diam: 3.10 cm RV Mid diam:    3.40 cm RV S prime:     11.70 cm/s TAPSE (M-mode): 1.6 cm LEFT ATRIUM             Index        RIGHT ATRIUM           Index LA diam:        3.80 cm 2.03 cm/m   RA Area:     24.20 cm LA Vol (A2C):   54.3 ml 29.01 ml/m  RA Volume:   78.90 ml  42.16 ml/m LA Vol (A4C):   49.6 ml 26.50 ml/m LA Biplane Vol: 53.9 ml 28.80 ml/m  AORTIC VALVE LVOT Vmax:   93.50 cm/s LVOT Vmean:  53.600 cm/s LVOT VTI:    0.156 m  AORTA Ao Root diam: 2.40 cm Ao Asc diam:  2.50 cm MITRAL VALVE                TRICUSPID VALVE MV Area (PHT): 3.37 cm     TR Peak grad:   17.6 mmHg MV Decel Time: 225 msec     TR Vmax:        210.00 cm/s MV E velocity: 106.00 cm/s MV A velocity: 56.00 cm/s   SHUNTS MV E/A ratio:  1.89         Systemic VTI:  0.16 m                             Systemic Diam: 1.90 cm Dani Gobble Croitoru MD Electronically signed by Sanda Klein MD Signature Date/Time: 10/28/2021/10:10:43 AM    Final    ECHO TEE  Result Date: 10/07/2021    TRANSESOPHOGEAL ECHO  REPORT   Patient Name:   [name] Date of Exam: 10/07/2021 Medical Rec #:  [id number]            Height:       62.0 in Accession #:    [id number]           Weight:       198.0 lb Date of Birth:  Apr 22, 1956            BSA:          1.904 m Patient Age:    4 years             BP:           109/59 mmHg Patient Gender: F                    HR:           86 bpm. Exam Location:  Inpatient Procedure: Transesophageal Echo, 3D Echo, Color Doppler and Cardiac Doppler Indications:     Watchman Follow Up  History:         Patient has prior history of Echocardiogram examinations, most  recent 08/21/2021. Defibrillator, Arrythmias:Atrial                  Fibrillation; Risk Factors:Dyslipidemia and Sleep Apnea.                  08/21/21 59mm Watchman Device Implanted.  Sonographer:     Raquel Sarna Senior RDCS Referring Phys:  [id number] Vickie Epley Diagnosing Phys: Sanda Klein MD PROCEDURE: After discussion of the risks and benefits of a TEE, an informed consent was obtained from the patient. The transesophogeal probe was passed without difficulty through the esophogus of the patient. Local oropharyngeal anesthetic was provided with Cetacaine. Sedation performed by different physician. The patient was monitored while under deep sedation. Anesthestetic sedation was provided intravenously by Anesthesiology: 187mg  of Propofol, 60mg  of Lidocaine. The patient developed no complications during the procedure. IMPRESSIONS  1. Left ventricular ejection fraction, by estimation, is 45 to 50%. The left ventricle has mildly decreased function. The left ventricle demonstrates global hypokinesis. There is the interventricular septum is flattened in diastole ('D' shaped left ventricle), consistent with right ventricular volume overload.  2. Right ventricular systolic function is mildly reduced. The right ventricular size is severely enlarged. There is normal pulmonary artery systolic pressure.  3. Well-seated Watchman  device without leak or thrombus. Left atrial size was mildly dilated. No left atrial/left atrial appendage thrombus was detected.  4. Right atrial size was severely dilated.  5. The mitral valve is normal in structure. Mild to moderate mitral valve regurgitation.  6. Mobile structures attached to the septal and anterior leaflets of the tricuspid valve are probably flail sements of the leaflets due to chordal rupture.. The tricuspid valve is abnormal. Tricuspid valve regurgitation is severe.  7. The aortic valve is tricuspid. Aortic valve regurgitation is not visualized. No aortic stenosis is present.  8. Evidence of atrial level shunting detected by color flow Doppler. There is a small secundum atrial septal defect with bidirectional shunting across the atrial septum. FINDINGS  Left Ventricle: Left ventricular ejection fraction, by estimation, is 45 to 50%. The left ventricle has mildly decreased function. The left ventricle demonstrates global hypokinesis. The left ventricular internal cavity size was normal in size. There is  no left ventricular hypertrophy. The interventricular septum is flattened in diastole ('D' shaped left ventricle), consistent with right ventricular volume overload. Right Ventricle: The right ventricular size is severely enlarged. No increase in right ventricular wall thickness. Right ventricular systolic function is mildly reduced. There is normal pulmonary artery systolic pressure. The tricuspid regurgitant velocity is 1.57 m/s, and with an assumed right atrial pressure of 15 mmHg, the estimated right ventricular systolic pressure is 28.4 mmHg. Left Atrium: Well-seated Watchman device without leak or thrombus. Left atrial size was mildly dilated. No left atrial/left atrial appendage thrombus was detected. Right Atrium: Right atrial size was severely dilated. Pericardium: There is no evidence of pericardial effusion. Mitral Valve: The mitral valve is normal in structure. Mild to moderate  mitral valve regurgitation. Tricuspid Valve: Mobile structures attached to the septal and anterior leaflets of the tricuspid valve are probably flail sements of the leaflets due to chordal rupture. The tricuspid valve is abnormal. Tricuspid valve regurgitation is severe. Aortic Valve: The aortic valve is tricuspid. Aortic valve regurgitation is not visualized. No aortic stenosis is present. Pulmonic Valve: The pulmonic valve was grossly normal. Pulmonic valve regurgitation is not visualized. Aorta: The aortic root, ascending aorta and aortic arch are all structurally normal, with no evidence of dilitation or obstruction. IAS/Shunts:  Evidence of atrial level shunting detected by color flow Doppler. There is a small secundum atrial septal defect with bidirectional shunting across the atrial septum. Additional Comments: A device lead is visualized in the superior vena cava, right atrium and right ventricle.  TRICUSPID VALVE TR Peak grad:   9.9 mmHg TR Vmax:        157.00 cm/s Sanda Klein MD Electronically signed by Sanda Klein MD Signature Date/Time: 10/07/2021/1:06:36 PM    Final    CUP PACEART REMOTE DEVICE CHECK  Result Date: 10/19/2021 [facility] remote reviewed. Normal device function.  Presenting EGM shows a possible AT, AF burden is 0.3% of the time, ? [place].  Sent to triage for ongoing atrial arrhythmia. The device estimates 6 months until ERI Next remote 11/13/2021. Kathy Breach, RN, CCDS, [employer]   [id number] shows asensed V paced at 73 bpm (personally reviewed)  TELEMETRY: V paced 70s (personally reviewed)  DEVICE HISTORY:  Medtronic BiV ICD implanted 2007, Gen change 2013, gen change with RV lead revision 2016 (previous lead fractured and abandoned) for NICM  Assessment/Plan: Pericardial effusion: Bedside echo 10/10/2021 with suspicion for tamponade.  Holding diuretics and antihypertensives Effusion appears moderate in size on echo 10/28/2021.  No evidence of tamponade.   May require pericardiocentesis if hemodynamics decline.  Plavix on hold   2. Chronic systolic CHF: Felt to have LBBB CM vs chemo induced (got adriamycin). 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 No effusion Appreciate CHF team care.    3. PAF Unable to complete ablation due to proximity of small pericardila effusion to RA/RIPV. S/p Watchman implant 08/21/21 Continue amiodarone 200 mg daily   4. AKI on CKD IIIb/IV Scr 1.9 >2.78>2.15, baseline variable (1.6-2.0) In setting of GI bleed and hypotension Follow closely.    5. GI bleeding BRBPR + melena 4-5 days prior to admission Per her report CT at OSH with diverticulosis but no diverticulitis Denies NSAID use 1 u PRBCs overnight for Hgb 8.8, recheck CBC today GI consulted Holding plavix  Dr. Quentin Ore has seen the patient.   For questions or updates, please contact [place] Please consult [email or web address] for contact info under Cardiology/STEMI.  Jacalyn Lefevre, PA-C  10/28/2021 11:22 AM

## 2021-10-28 NOTE — Procedures (Signed)
Central Venous Catheter Insertion Procedure Note  [name]  [id number]  Oct 06, 1955  Date:10/28/21  Time:1:17 AM   Provider Performing:Gregg Winchell Alfred Levins   Procedure: Insertion of Non-tunneled Central Venous [id number]) with US guidance (53664)   Indication(s) Medication administration  Consent Risks of the procedure as well as the alternatives and risks of each were explained to the patient and/or caregiver.  Consent for the procedure was obtained and is signed in the bedside chart  Anesthesia Topical only with 1% lidocaine   Timeout Verified patient identification, verified procedure, site/side was marked, verified correct patient position, special equipment/implants available, medications/allergies/relevant history reviewed, required imaging and test results available.  Sterile Technique Maximal sterile technique including full sterile barrier drape, hand hygiene, sterile gown, sterile gloves, mask, hair covering, sterile ultrasound probe cover (if used).  Procedure Description Area of catheter insertion was cleaned with chlorhexidine and draped in sterile fashion.  With real-time ultrasound guidance a central venous catheter was placed into the left internal jugular vein. Nonpulsatile blood flow and easy flushing noted in all ports.  The catheter was sutured in place and sterile dressing applied.  Complications/Tolerance None; patient tolerated the procedure well. Chest X-ray is ordered to verify placement for internal jugular or subclavian cannulation.   Chest x-ray is not ordered for femoral cannulation.  EBL Minimal  Specimen(s) None

## 2021-10-28 NOTE — Progress Notes (Addendum)
Advanced Heart Failure Rounding Note  PCP-Cardiologist: None   Subjective:   02/20: Transferred to [facility] for GI bleed + pericardial effusion  On NE 18 this am  Received 1 u PRBCs overnight. Denies recurrent BRBPR.  No dyspnea at rest.   Rhythm Vpaced 70s     Objective:   Weight Range: 86.3 kg Body mass index is 34.8 kg/m.   Vital Signs:   Temp:  [97.9 F (36.6 C)-98.4 F (36.9 C)] 97.9 F (36.6 C) (02/21 0502) Pulse Rate:  [70-120] 73 (02/21 0630) Resp:  [17-34] 20 (02/21 0630) BP: (74-136)/(25-117) 89/75 (02/21 0630) SpO2:  [87 %-100 %] 96 % (02/21 0630) Weight:  [86.3 kg] 86.3 kg (02/20 2124) Last BM Date : 10/18/2021  Weight change: Filed Weights   10/22/2021 2124  Weight: 86.3 kg    Intake/Output:   Intake/Output Summary (Last 24 hours) at 10/28/2021 0740 Last data filed at 10/28/2021 0600 Gross per 24 hour  Intake 1689.02 ml  Output --  Net 1689.02 ml      Physical Exam    General:  No distress. Lying comfortably in bed. HEENT: Normal Neck: Supple. No JVD. R IJ CVC. Carotids 2+ bilat; no bruits.  Cor: PMI nondisplaced. Regular rate & rhythm. No rubs, gallops or murmurs. Lungs: Clear Abdomen: Soft, nontender, nondistended. No hepatosplenomegaly.  Extremities: No cyanosis, clubbing, rash, edema Neuro: Alert & orientedx3, cranial nerves grossly intact. moves all 4 extremities w/o difficulty. Affect pleasant   Telemetry   V-paced 70s   Labs    CBC Recent Labs    10/26/2021 2138 10/28/21 0513  WBC 13.8*  --   HGB 8.8* 9.8*  HCT 28.2* 32.1*  MCV 94.0  --   PLT 534*  --    Basic Metabolic Panel Recent Labs    10/21/2021 2138  NA 131*  K 3.1*  CL 94*  CO2 24  GLUCOSE 127*  BUN 47*  CREATININE 2.78*  CALCIUM 8.4*   Liver Function Tests Recent Labs    10/18/2021 2138  AST 33  ALT 25  ALKPHOS 111  BILITOT 0.9  PROT 5.9*  ALBUMIN 2.6*   No results for input(s): LIPASE, AMYLASE in the last 72 hours. Cardiac Enzymes No results  for input(s): CKTOTAL, CKMB, CKMBINDEX, TROPONINI in the last 72 hours.  BNP: BNP (last 3 results) Recent Labs    02/24/21 1357 07/16/21 1145 10/26/2021 2138  BNP 326.4* 523.7* 477.9*    ProBNP (last 3 results) No results for input(s): PROBNP in the last 8760 hours.   D-Dimer No results for input(s): DDIMER in the last 72 hours. Hemoglobin A1C No results for input(s): HGBA1C in the last 72 hours. Fasting Lipid Panel No results for input(s): CHOL, HDL, LDLCALC, TRIG, CHOLHDL, LDLDIRECT in the last 72 hours. Thyroid Function Tests No results for input(s): TSH, T4TOTAL, T3FREE, THYROIDAB in the last 72 hours.  Invalid input(s): FREET3  Other results:   Imaging    DG CHEST PORT 1 VIEW  Result Date: 10/28/2021 CLINICAL DATA:  Central line placement. EXAM: PORTABLE CHEST 1 VIEW COMPARISON:  PA Lat 08/21/2021. FINDINGS: Right IJ central line is in position with tip in the right atrium. There is no pneumothorax. Right chest dual lead pacing system with AID wiring and wire insertions are similar. The heart again noted enlarged but the central vessels are normal caliber. Since the prior study a left atrial appendage occlusion device has been inserted. The lungs are generally clear. No significant pleural effusion is evident.  Overlying metallic structures noted on the right inferiorly. Multiple overlying monitor leads. IMPRESSION: 1. Right IJ central line tip in the right atrium. 2. Cardiomegaly, with normal caliber central vessels. 3. Interval insertion of a left atrial appendage occlusion device. Electronically Signed   By: Telford Nab M.D.   On: 10/28/2021 01:24     Medications:     Scheduled Medications:  allopurinol  100 mg Oral Q1500   atorvastatin  10 mg Oral q AM   buPROPion  150 mg Oral BID   cholecalciferol  5,000 Units Oral q AM   empagliflozin  10 mg Oral QAC breakfast   multivitamin with minerals  1 tablet Oral Daily   pantoprazole  40 mg Oral Daily   potassium  chloride SA  40 mEq Oral TID   venlafaxine XR  150 mg Oral Q breakfast   zolpidem  5 mg Oral QHS    Infusions:  sodium chloride 100 mL/hr at 10/28/21 0600   norepinephrine (LEVOPHED) Adult infusion 18 mcg/min (10/28/21 0600)    PRN Medications: acetaminophen, nitroGLYCERIN, ondansetron (ZOFRAN) IV   Assessment/Plan   Pericardial effusion: -Bedside echo last night with suspicion for tamponade.  -Holding diuretics and antihypertensives -Effusion appears moderate in size on echo today. There does not appear to be any evidence of tamponade. Monitor closely. If hemodynamic instability, may require pericardiocentesis -Plavix now on hold  2. Chronic systolic CHF: - felt to have LBBB CM vs chemo induced (got adriamycin). 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 No effusion -Attempt to wean NE today. SBP typically around 90 mmHg. Will consider adding midodrine if difficulty titrating NE off -Holding home Torsemide + metolazone. Will likely need to restart diuretics soon. -Home spiro and bisoprolol held -Hold jardiance with AKI. GFR < 20.  3. PAF - Unable to complete ablation - S/p Watchman implant 08/21/21 - Resume home amiodarone 200 mg daily  4. 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 GI bleeding - BRBPR + melena 4-5 days prior to admission - Per her report CT at OSH with diverticulosis but no diverticulitis - Denies NSAID use - 1 u PRBCs last night for Hgb 8.8, recheck CBC today - Consult GI  - Holding plavix  6. OSA - Unable to tolerate CPAP  Length of Stay: 1  FINCH, LINDSAY N, PA-C  10/28/2021, 7:40 AM  Advanced Heart Failure Team Pager [phone] (M-F; 7a - 5p)  Please contact  [facility] for night-coverage after hours (5p -7a ) and weekends on [email or web address]   Patient seen and examined with the above-signed Advanced Practice Provider and/or Housestaff. I personally reviewed laboratory data, imaging studies and relevant notes. I independently examined the patient and formulated the important aspects of the plan. I have edited the note to reflect any of my changes or salient points. I have personally discussed the plan with the patient and/or family.  66 y/o woman well known to me from Clinic. Has h/o NICM due to LBBB vs adriamycin dating bak to 2007. Improved with CRT. However EF dropped back down to 20-25% in 22 and also haf evidence of R-sided overload/PAH   Underwent attempted AF ablation in 10/22 but procedure aborted due to development of  pericardial effusion.  Had Watchman 12/22. F/u TEE 1/23 No effusion  Now admitted from [facility] with several days of BRBPR. Denies abdominal pain, NSAIDs, severe hemorrhoids etc. CT at [facility] was negative. Echo showed moderate-sized pericardial effusion.  Transferred here.   Denies recent fevers or chills. Now on NE 18 SBP 105-115. (Home BPs typically 80-90s)  ECHO today EF 35-40% RV ok. Moderate posterior effusion. IVC dilated with mild collapse on sniff. No RA/RV collapse. No mitral inflow resp variation.   General:  Sitting up in bed. No resp difficulty HEENT: normal Neck: supple. RIJ TLC Carotids 2+ bilat; no bruits. No lymphadenopathy or thryomegaly appreciated. Cor: PMI nondisplaced. Regular rate & rhythm. No rubs, gallops or murmurs. Lungs: clear Abdomen: obese soft, nontender, nondistended. No hepatosplenomegaly. No bruits or masses. Good bowel sounds. Extremities: warm no cyanosis, clubbing, rash, edema Neuro: alert & orientedx3, cranial nerves grossly intact. moves all 4 extremities w/o difficulty. Affect pleasant  She has a moderate posterior pericardial effusion of unclear etiology. Doubt procedurally  related given timeline and normal echo 1/23. Three has been concern for development of underlying PAH/RV failure. Last RHC 2019 with normal PA pressures.   I d/w Drs. Quentin Ore and Ali Lowe and [place] at bedside. Currently no evidence of tamponade. Will plan to watch for 48 hours and repeat echo at that time (sooner if clinically indicated). Wean NE slowly. Can start midodrine as needed.   May warrant RHC to assess hemodynamics and PA pressures prior to d/c. Consult GI for LGIB.   Glori Bickers, MD  8:39 AM

## 2021-10-28 NOTE — Progress Notes (Signed)
Progress Note  Patient Name: [name] Date of Encounter: 10/28/2021  [facility] Cardiologist: None   Subjective   Remains on NE this AM.  TTE at bedside with moderate pericardial effusion and no echo signs of tamponade.  CVP 85mmHg.  Denies significant dyspnea or recurrent BRBPR.  Inpatient Medications    Scheduled Meds:  allopurinol  100 mg Oral Q1500   atorvastatin  10 mg Oral q AM   buPROPion  150 mg Oral BID   cholecalciferol  5,000 Units Oral q AM   multivitamin with minerals  1 tablet Oral Daily   pantoprazole  40 mg Oral Daily   potassium chloride SA  40 mEq Oral TID   venlafaxine XR  150 mg Oral Q breakfast   zolpidem  5 mg Oral QHS   Continuous Infusions:  sodium chloride 100 mL/hr at 10/28/21 0600   norepinephrine (LEVOPHED) Adult infusion 18 mcg/min (10/28/21 0600)   PRN Meds: acetaminophen, nitroGLYCERIN, ondansetron (ZOFRAN) IV   Vital Signs    Vitals:   10/28/21 0502 10/28/21 0530 10/28/21 0600 10/28/21 0630  BP:  (!) 112/54 106/87 (!) 89/75  Pulse: 72 73 73 73  Resp: (!) 22 20 (!) 23 20  Temp: 97.9 F (36.6 C)     TempSrc: Oral     SpO2: 98% 97% 97% 96%  Weight:      Height:        Intake/Output Summary (Last 24 hours) at 10/28/2021 0848 Last data filed at 10/28/2021 0600 Gross per 24 hour  Intake 1689.02 ml  Output --  Net 1689.02 ml   Last 3 Weights 10/12/2021 10/16/2021 10/07/2021  Weight (lbs) 190 lb 4.1 oz 198 lb 9.6 oz 198 lb  Weight (kg) 86.3 kg 90.084 kg 89.812 kg      Telemetry    SR - Personally Reviewed  ECG    A sensed V paced- Personally Reviewed  Physical Exam   GEN: No acute distress.   Neck: No JVD; RIJ central line Cardiac: RRR, no murmurs, rubs, or gallops. No pulsus on exam Respiratory: Clear to auscultation bilaterally. GI: Soft, nontender, non-distended  MS: No edema; No deformity. Neuro:  Nonfocal  Psych: Normal affect   Labs    High Sensitivity Troponin:   Recent Labs  Lab  10/28/21 0000  TROPONINIHS 11     Chemistry Recent Labs  Lab 10/13/2021 2138  NA 131*  K 3.1*  CL 94*  CO2 24  GLUCOSE 127*  BUN 47*  CREATININE 2.78*  CALCIUM 8.4*  PROT 5.9*  ALBUMIN 2.6*  AST 33  ALT 25  ALKPHOS 111  BILITOT 0.9  GFRNONAA 18*  ANIONGAP 13    Lipids No results for input(s): CHOL, TRIG, HDL, LABVLDL, LDLCALC, CHOLHDL in the last 168 hours.  Hematology Recent Labs  Lab 10/21/2021 2138 10/28/21 0513 10/28/21 0821  WBC 13.8*  --  16.7*  RBC 3.00*  --  3.39*  HGB 8.8* 9.8* 9.6*  HCT 28.2* 32.1* 31.9*  MCV 94.0  --  94.1  MCH 29.3  --  28.3  MCHC 31.2  --  30.1  RDW 18.2*  --  18.1*  PLT 534*  --  530*   Thyroid No results for input(s): TSH, FREET4 in the last 168 hours.  BNP Recent Labs  Lab 10/15/2021 2138  BNP 477.9*    DDimer No results for input(s): DDIMER in the last 168 hours.   Radiology    DG CHEST PORT 1 VIEW  Result Date: 10/28/2021 CLINICAL DATA:  Central line placement. EXAM: PORTABLE CHEST 1 VIEW COMPARISON:  PA Lat 08/21/2021. FINDINGS: Right IJ central line is in position with tip in the right atrium. There is no pneumothorax. Right chest dual lead pacing system with AID wiring and wire insertions are similar. The heart again noted enlarged but the central vessels are normal caliber. Since the prior study a left atrial appendage occlusion device has been inserted. The lungs are generally clear. No significant pleural effusion is evident. Overlying metallic structures noted on the right inferiorly. Multiple overlying monitor leads. IMPRESSION: 1. Right IJ central line tip in the right atrium. 2. Cardiomegaly, with normal caliber central vessels. 3. Interval insertion of a left atrial appendage occlusion device. Electronically Signed   By: Telford Nab M.D.   On: 10/28/2021 01:24    Cardiac Studies   TEE 1/23  1. Left ventricular ejection fraction, by estimation, is 45 to 50%. The  left ventricle has mildly decreased function. The  left ventricle  demonstrates global hypokinesis. There is the interventricular septum is  flattened in diastole ('D' shaped left  ventricle), consistent with right ventricular volume overload.   2. Right ventricular systolic function is mildly reduced. The right  ventricular size is severely enlarged. There is normal pulmonary artery  systolic pressure.   3. Well-seated Watchman device without leak or thrombus. Left atrial size  was mildly dilated. No left atrial/left atrial appendage thrombus was  detected.   4. Right atrial size was severely dilated.   5. The mitral valve is normal in structure. Mild to moderate mitral valve  regurgitation.   6. Mobile structures attached to the septal and anterior leaflets of the  tricuspid valve are probably flail sements of the leaflets due to chordal  rupture.. The tricuspid valve is abnormal. Tricuspid valve regurgitation  is severe.   7. The aortic valve is tricuspid. Aortic valve regurgitation is not  visualized. No aortic stenosis is present.   8. Evidence of atrial level shunting detected by color flow Doppler.  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    TTE 10/22  1. Left ventricular ejection fraction, by estimation, is 25 to 30%. The  left ventricle has severely decreased function. The left ventricle  demonstrates regional wall motion abnormalities (see scoring  diagram/findings for description). The left  ventricular internal cavity size was mildly dilated. Left ventricular  diastolic parameters are indeterminate. There is the interventricular  septum is flattened in diastole ('D' shaped left ventricle), consistent  with right ventricular volume overload.  The abnormal septal motion may be due to RV pacing.   2. Right ventricular systolic function is normal. The right ventricular  size is normal. There is normal pulmonary artery systolic pressure.   3. Right atrial size  was moderately dilated.   4. The mitral valve is grossly normal. Trivial mitral valve  regurgitation.   5. There appears to be septal leaflet prolaps ( partial flail) associated  with a very eccentric TR jet. This is not significantly changed from  previous echoes. . The tricuspid valve is degenerative. Tricuspid valve  regurgitation is moderate to severe.   6. The aortic valve is tricuspid. Aortic valve regurgitation is not  visualized. No aortic stenosis is present.  Patient Profile     66 y.o. female with paroxysmal atrial fibrillation status post failed ablation, status post Watchman December 2022, breast cancer status post Adriamycin therapy, nonischemic cardiomyopathy with ejection  fraction 25 to 30% (on TEE improved), status post BiV Medtronic ICD, hypertension, obstructive sleep apnea, and chronic kidney disease here with lower GI bleeding and pericardial effusion.  Assessment & Plan    1.  Pericardial effusion.  Echocardiogram at bedside demonstrated a moderate pericardial effusion with no echocardiographic signs of tamponade.  She has no pulses on exam today.  We will repeat an echocardiogram on February 23 to evaluate the effusion.  We will stop Plavix now.  This was discussed with Dr. Quentin Ore. 2.  Nonischemic cardiomyopathy.  Patient's blood pressure is usually relatively low.  We will wean norepinephrine to systolic blood pressure of 90 to 100 mmHg.  Depending on the patient's response to therapy may pursue right heart catheterization to evaluate filling pressures and pulmonary artery pressures.  Given evidence of RV volume overload on TEE.  The patient's TTE from October demonstrated an ejection fraction of 25 to 30%.  Her most recent TEE demonstrated an improved ejection fraction which may be due to the fact she was under anesthesia and had a likely relatively low blood pressure.  We will follow-up echocardiogram done today for ejection fraction and status of RV function and filling.   CVP 64mmHg this AM. 3.  GI bleed: We will obtain GI consult for further assessment.  Hold Plavix for now. 4.  Chronic kidney disease: Patient's creatinine is 2.78.  Her creatinine is normally in the 1.8-2.2 range.  Monitor for now.   5.  Atrial fibrillation status post Watchman: Hold Plavix for now; TEE demonstrated no leaks in January.  For questions or updates, please contact [place] Please consult [email or web address] for contact info under        Signed, Early Osmond, MD  10/28/2021, 8:48 AM    CRITICAL CARE Performed by: Lenna Sciara   Total critical care time: 35 minutes. Critical care time was exclusive of separately billable procedures and treating other patients. Critical care was necessary to treat or prevent imminent or life-threatening deterioration. Critical care was time spent personally by me on the following activities: development of treatment plan with patient and/or surrogate as well as nursing, discussions with consultants, evaluation of patient's response to treatment, examination of patient, obtaining history from patient or surrogate, ordering and performing treatments and interventions, ordering and review of laboratory studies, ordering and review of radiographic studies, pulse oximetry and re-evaluation of patient's condition.

## 2021-10-28 NOTE — Progress Notes (Signed)
Echocardiogram 2D Echocardiogram has been performed.  [name] 10/28/2021, 8:33 AM

## 2021-10-28 NOTE — Consult Note (Addendum)
Referring Provider: Cardiology PCP: Wayna Chalet, NP  Gastroenterologist: Thea Silversmith  Reason for consultation:     GI bleed              ASSESSMENT / PLAN   #  66 yo female with  painless hematochezia on plavix. Presumably lower. Hgb down from baseline of 11-12 in January 2023 to 8.8.            --Hgb up to 9.6 post 1 u PRBC this am.  --Will likely need colonoscopy , +/- EGD after Plavix washout and when medically stable from cardiac standpoint. Sounds like her last colonoscopy was 10 + years ago --For now, bleeding has stopped  # Pericardial effusion. Cardiology following. Bedside TEE with moderate pericardial effusion but no echo signs of tamponade.   # Chronic systolic heart failure . Felt to have LBBB CM vs chemo induced ( Adriamycin for breast ca).  --TEE 01/23: EF 45-50% --Has AKI on CKD . Diuretics on hold  # PAF, s/p Watchman implant Dec 2022  # Additional medical history listed below.   HISTORY OF PRESENT ILLNESS                                                                                                                         Chief Complaint:  blood in stool  [name] is a 66 y.o. female with a past medical history significant for PAF with failed ablation s/p Watchman implant Dec 2022, non-ischemic cardiomyopathy , chronic systolic heart failure, ICD placement, HTN, OSA, DM, CKD 3, breast cancer ( [place]) s/p left breast surgery and chemotherapy, See PMH for any additional medical problems.   Patient presented to [facility] ED in [facility] yesterday for blood in stool.  CT scan without contrast showed distal diverticulosis but also a large pericardial effusion. She was transferred to [facility].  She began having hematochezia about 4 days ago. She was averaging ~ 3 BMs a day which is more frequent than her norm. The blood and stools were red but eventually toward the end turned dark. She hasn't had any BMs or bleeding since yesterday. She had no  associated abdominal pain. No nausea / vomiting. No NSAID use. Last Plavix was on Sunday 2/19. No previous history of GI bleeding. Her last colonoscopy was 10 + years ago in [place]. She reports a negative Cologuard ~ 2 years ago. No [place] of colon cancer.   [name] has a history of GERD of ~ 7 years duration. She is asymptomatic on daily Pantoprazole. To her knowledge she has never had an EGD   IMAGING:  DG CHEST PORT 1 VIEW  Result Date: 10/28/2021 CLINICAL DATA:  Central line placement. EXAM: PORTABLE CHEST 1 VIEW COMPARISON:  PA Lat 08/21/2021. FINDINGS: Right IJ central line is in position with tip in the right atrium. There is no pneumothorax. Right chest dual lead pacing system with AID wiring and wire insertions are similar. The heart again noted enlarged but  the central vessels are normal caliber. Since the prior study a left atrial appendage occlusion device has been inserted. The lungs are generally clear. No significant pleural effusion is evident. Overlying metallic structures noted on the right inferiorly. Multiple overlying monitor leads. IMPRESSION: 1. Right IJ central line tip in the right atrium. 2. Cardiomegaly, with normal caliber central vessels. 3. Interval insertion of a left atrial appendage occlusion device. Electronically Signed   By: Telford Nab M.D.   On: 10/28/2021 01:24   ECHOCARDIOGRAM COMPLETE  Result Date: 10/28/2021    ECHOCARDIOGRAM REPORT   Patient Name:   [name] Date of Exam: 10/28/2021 Medical Rec #:  [id number]            Height:       62.0 in Accession #:    [id number]           Weight:       190.3 lb Date of Birth:  1956-06-14            BSA:          1.872 m Patient Age:    49 years             BP:           89/75 mmHg Patient Gender: F                    HR:           73 bpm. Exam Location:  Inpatient Procedure: 2D Echo STAT ECHO Indications:    Pericardial effusion  History:        Patient has prior history of Echocardiogram examinations, most                  recent 06/30/2021. CHF, Signs/Symptoms:Shortness of Breath; Risk                 Factors:Dyslipidemia.  Sonographer:    Arlyss Gandy Referring Phys: [id number] FAN YE IMPRESSIONS  1. Left ventricular ejection fraction, by estimation, is 35 to 40%. The left ventricle has moderately decreased function. The left ventricle demonstrates global hypokinesis. Left ventricular diastolic parameters are consistent with Grade II diastolic dysfunction (pseudonormalization). Elevated left atrial pressure. There is the interventricular septum is flattened in diastole ('D' shaped left ventricle), consistent with right ventricular volume overload.  2. Right ventricular systolic function is low normal. The right ventricular size is severely enlarged. A is visualized in the coronary sinus. There is normal pulmonary artery systolic pressure.  3. Left atrial size was mildly dilated.  4. Right atrial size was severely dilated.  5. Moderate pericardial effusion. The pericardial effusion is circumferential. There is no evidence of cardiac tamponade.  6. The mitral valve is myxomatous. Moderate mitral valve regurgitation.  7. Flail posterior tricuspid valve leaflet. The tricuspid valve is abnormal. Tricuspid valve regurgitation is severe.  8. The aortic valve is tricuspid. Aortic valve regurgitation is not visualized. Aortic valve sclerosis is present, with no evidence of aortic valve stenosis.  9. The inferior vena cava is dilated in size with <50% respiratory variability, suggesting right atrial pressure of 15 mmHg. 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 This may allow equalization of atrial pressures and mask respiratory flow variation as a sign of tamponade. Right heart volume overload may reduce the occurence of right heart chamber collapse. Serial studies are recommended.  FINDINGS  Left Ventricle: Left ventricular ejection fraction, by estimation, is 35 to 40%. The left ventricle has moderately decreased function. The left ventricle demonstrates global hypokinesis. The left ventricular internal cavity size was normal in size. There is no left ventricular hypertrophy. The interventricular septum is flattened in diastole ('D' shaped left ventricle), consistent with right ventricular volume overload. Left ventricular diastolic parameters are consistent with Grade II diastolic dysfunction (pseudonormalization). Elevated left atrial pressure. Right Ventricle: The right ventricular size is severely enlarged. No increase in right ventricular wall thickness. Right ventricular systolic function is low normal. There is normal pulmonary artery systolic pressure. The tricuspid regurgitant velocity is 2.10 m/s, and with an assumed right atrial pressure of 15 mmHg, the estimated right ventricular systolic pressure is 23.3 mmHg. Left Atrium: Left atrial size was mildly dilated. Right Atrium: Right atrial size was severely dilated. Pericardium: A moderately sized pericardial effusion is present. The pericardial effusion is circumferential. There is no evidence of cardiac tamponade. Mitral Valve: The mitral valve is myxomatous. Moderate mitral valve regurgitation, with eccentric posteriorly directed jet. Tricuspid Valve: Flail posterior tricuspid valve leaflet. The tricuspid valve is abnormal. Tricuspid valve regurgitation is severe. Aortic Valve: The aortic valve is tricuspid. Aortic valve regurgitation is not visualized. Aortic valve sclerosis is present, with no evidence of aortic valve stenosis. Pulmonic Valve: The pulmonic valve was normal in structure. Pulmonic valve regurgitation is trivial. Aorta: The aortic root and ascending aorta are structurally normal, with no evidence of dilitation. Venous: The inferior vena cava is dilated in size with less than 50% respiratory variability, suggesting  right atrial pressure of 15 mmHg. IAS/Shunts: The interatrial septum was not well visualized. Additional Comments: A device lead is visualized in the right ventricle, right atrium and coronary sinus.  LEFT VENTRICLE PLAX 2D LVIDd:         3.70 cm   Diastology LVIDs:         3.20 cm   LV e' medial:    7.62 cm/s LV PW:         0.60 cm   LV E/e' medial:  13.9 LV IVS:        0.60 cm   LV e' lateral:   6.85 cm/s LVOT diam:     1.90 cm   LV E/e' lateral: 15.5 LV SV:         44 LV SV Index:   24 LVOT Area:     2.84 cm  RIGHT VENTRICLE             IVC RV Basal diam:  4.00 cm     IVC diam: 3.10 cm RV Mid diam:    3.40 cm RV S prime:     11.70 cm/s TAPSE (M-mode): 1.6 cm LEFT ATRIUM             Index        RIGHT ATRIUM           Index LA diam:        3.80 cm 2.03 cm/m   RA Area:     24.20 cm LA Vol (A2C):   54.3 ml 29.01 ml/m  RA Volume:   78.90 ml  42.16 ml/m LA Vol (A4C):   49.6 ml 26.50 ml/m LA Biplane Vol: 53.9 ml 28.80 ml/m  AORTIC VALVE LVOT Vmax:   93.50 cm/s LVOT Vmean:  53.600 cm/s LVOT VTI:    0.156 m  AORTA Ao Root diam: 2.40 cm Ao Asc diam:  2.50 cm MITRAL VALVE  TRICUSPID VALVE MV Area (PHT): 3.37 cm     TR Peak grad:   17.6 mmHg MV Decel Time: 225 msec     TR Vmax:        210.00 cm/s MV E velocity: 106.00 cm/s MV A velocity: 56.00 cm/s   SHUNTS MV E/A ratio:  1.89         Systemic VTI:  0.16 m                             Systemic Diam: 1.90 cm Mihai Croitoru MD Electronically signed by Sanda Klein MD Signature Date/Time: 10/28/2021/10:10:43 AM    Final       Past Medical History:  Diagnosis Date   AICD (automatic cardioverter/defibrillator) present    Biventricular implantable cardiac defibrillator)    Medtronic   Breast cancer ([place]) 1998   - left   CHF (congestive heart failure) ([place])    takes Lasix and Aldactone daily   Chronic kidney disease    Complication of anesthesia    n/v, none at last SURGERY-2022.   GERD (gastroesophageal reflux disease)    takes Omeprazole  daily   Gout    takes Allopurinol daily   History of bronchitis 2 yrs ago   History of shingles    Hyperlipidemia    takes Simvastatin daily   Insomnia    takes Ambien nightly   Nonischemic cardiomyopathy ([place])    EF 55% echo 2012   NSVT (nonsustained ventricular tachycardia)    Obesity    OSA (obstructive sleep apnea)    Unable to wear CPAP   Pneumonia 2005   Presence of permanent cardiac pacemaker    Shortness of breath dyspnea    with exertion   sprint Fidelis     Past Surgical History:  Procedure Laterality Date   ABDOMINAL HYSTERECTOMY     APPENDECTOMY  09/07/1976   ATRIAL FIBRILLATION ABLATION N/A 06/30/2021   Procedure: ATRIAL FIBRILLATION ABLATION;  Surgeon: Vickie Epley, MD;  Location: [facility];  Service: Cardiovascular;  Laterality: N/A;   BiV ICD  09/07/2005   Medtronic   BIV ICD GENERTAOR CHANGE OUT N/A 12/16/2011   Procedure: BIV ICD GENERTAOR CHANGE OUT;  Surgeon: [name], MD;  Location: [facility] CATH LAB;  Service: Cardiovascular;  Laterality: N/A;   CHOLECYSTECTOMY  09/07/2017   COLONOSCOPY     cyst removed from wrist Left    early 70's   ICD LEAD REMOVAL Right 10/18/2014   Procedure: ICD LEAD REMOVAL/EXTRACTION/REIMPLANTATION;  Surgeon: Evans Lance, MD;  Location: [facility];  Service: Cardiovascular;  Laterality: Right;   KYPHOPLASTY N/A 06/06/2021   Procedure: KYPHOPLASTY LUMBAR ONE;  Surgeon: Karsten Ro, DO;  Location: [facility];  Service: Neurosurgery;  Laterality: N/A;   LEFT ATRIAL APPENDAGE OCCLUSION N/A 08/21/2021   Procedure: LEFT ATRIAL APPENDAGE OCCLUSION;  Surgeon: Vickie Epley, MD;  Location: [facility];  Service: Cardiovascular;  Laterality: N/A;   MASTECTOMY Left 09/07/1996   port a cath placed  09/07/1996   port removed  09/07/1996   RIGHT HEART CATH N/A 06/20/2018   Procedure: RIGHT HEART CATH;  Surgeon: Jolaine Artist, MD;  Location: [place] [facility];  Service: Cardiovascular;  Laterality: N/A;   TEE  WITHOUT CARDIOVERSION N/A 08/21/2021   Procedure: TRANSESOPHAGEAL ECHOCARDIOGRAM (TEE);  Surgeon: Vickie Epley, MD;  Location: [facility];  Service: Cardiovascular;  Laterality: N/A;   TEE  WITHOUT CARDIOVERSION N/A 10/07/2021   Procedure: TRANSESOPHAGEAL ECHOCARDIOGRAM (TEE);  Surgeon: Sanda Klein, MD;  Location: [facility];  Service: Cardiovascular;  Laterality: N/A;    Prior to Admission medications   Medication Sig Start Date End Date Taking? Authorizing Provider  alendronate (FOSAMAX) 70 MG tablet Take 70 mg by mouth every Monday.    [provider]  allopurinol (ZYLOPRIM) 100 MG tablet Take 100 mg by mouth daily in the afternoon. 03/18/18   [provider]  amiodarone (PACERONE) 200 MG tablet Take 200 mg by mouth daily.    [provider]  atorvastatin (LIPITOR) 10 MG tablet Take 10 mg by mouth in the morning.    [provider]  bisoprolol (ZEBETA) 5 MG tablet Take 1 tablet (5 mg total) by mouth daily. 07/16/21   Rafael Bihari, FNP  buPROPion (WELLBUTRIN SR) 150 MG 12 hr tablet Take 150 mg by mouth 2 (two) times daily. 04/18/21   [provider]  Cholecalciferol (VITAMIN D3) 5000 units TABS Take 5,000 Units by mouth in the morning.    [provider]  clopidogrel (PLAVIX) 75 MG tablet Take 1 tablet (75 mg total) by mouth daily. 10/07/21 04/05/22  Tommie Raymond, NP  Cyanocobalamin (VITAMIN B-12) 5000 MCG SUBL Place 5,000 mcg under the tongue in the morning.    [provider]  JARDIANCE 10 MG TABS tablet TAKE 1 TABLET BY MOUTH DAILY BEFORE BREAKFAST. 10/21/21   Larey Dresser, MD  metolazone (ZAROXOLYN) 2.5 MG tablet Take 2.5 mg by mouth 2 (two) times a week. Tuesdays and Thursdays.    [provider]  Multiple Vitamin (MULTIVITAMIN WITH MINERALS) TABS tablet Take 1 tablet by mouth in the morning. One-A-Day for Women 50+    [provider]  pantoprazole (PROTONIX) 40 MG tablet Take 1  tablet (40 mg total) by mouth daily. 08/22/21   Kathyrn Drown D, NP  potassium chloride SA (KLOR-CON M) 20 MEQ tablet Take 40 mEq by mouth 3 (three) times daily.    [provider]  spironolactone (ALDACTONE) 25 MG tablet Take 25 mg by mouth daily in the afternoon.    [provider]  torsemide (DEMADEX) 20 MG tablet Take 2 tablets (40 mg total) by mouth 2 (two) times daily. 10/16/21   Bensimhon, Shaune Pascal, MD  venlafaxine XR (EFFEXOR-XR) 150 MG 24 hr capsule Take 150 mg by mouth daily with breakfast.    [provider]  zolpidem (AMBIEN) 10 MG tablet Take 10 mg by mouth at bedtime.    [provider]    Current Facility-Administered Medications  Medication Dose Route Frequency Provider Last Rate Last Admin   0.9 %  sodium chloride infusion   Intravenous Continuous Laurice Record, MD 100 mL/hr at 10/28/21 1000 Infusion Verify at 10/28/21 1000   acetaminophen (TYLENOL) tablet 650 mg  650 mg Oral Q4H PRN Laurice Record, MD       allopurinol (ZYLOPRIM) tablet 100 mg  100 mg Oral Q1500 Laurice Record, MD       atorvastatin (LIPITOR) tablet 10 mg  10 mg Oral q AM Laurice Record, MD   10 mg at 10/28/21 2778   buPROPion [facility] SR) 12 hr tablet 150 mg  150 mg Oral BID Laurice Record, MD   150 mg at 10/28/21 2423   Chlorhexidine Gluconate Cloth 2 % PADS 6 each  6 each Topical Daily Bensimhon, Shaune Pascal, MD       cholecalciferol (VITAMIN D3) tablet 5,000 Units  5,000  Units Oral q AM Laurice Record, MD   5,000 Units at 10/28/21 7517   multivitamin with minerals tablet 1 tablet  1 tablet Oral Daily Laurice Record, MD   1 tablet at 10/28/21 0947   nitroGLYCERIN (NITROSTAT) SL tablet 0.4 mg  0.4 mg Sublingual Q5 Min x 3 PRN Laurice Record, MD       norepinephrine (LEVOPHED) 4mg  in 257mL (0.016 mg/mL) premix infusion  0-40 mcg/min Intravenous Titrated Laurice Record, MD 45 mL/hr at 10/28/21 1000 12 mcg/min at 10/28/21 1000   ondansetron (ZOFRAN) injection 4 mg  4 mg Intravenous Q6H PRN Laurice Record, MD       pantoprazole (PROTONIX) EC  tablet 40 mg  40 mg Oral Daily Laurice Record, MD   40 mg at 10/28/21 0947   potassium chloride SA (KLOR-CON M) CR tablet 40 mEq  40 mEq Oral TID Laurice Record, MD   40 mEq at 10/28/21 0947   venlafaxine XR (EFFEXOR-XR) 24 hr capsule 150 mg  150 mg Oral Q breakfast Laurice Record, MD   150 mg at 10/28/21 0859   zolpidem (AMBIEN) tablet 5 mg  5 mg Oral QHS Laurice Record, MD   5 mg at 10/11/2021 2155    Allergies as of 10/20/2021 - Review Complete 10/30/2021  Allergen Reaction Noted   Buprenorphine hcl  07/25/2015   Morphine and related Other (See Comments) 07/04/2015   Percocet [oxycodone-acetaminophen] Shortness Of Breath 07/25/2015   Oxycodone Other (See Comments) 07/25/2015    Family History  Problem Relation Age of Onset   Diabetes Mother    Heart disease Father    Alzheimer's disease Father     Social History   Socioeconomic History   Marital status: Married    Spouse name: Not on file   Number of children: Not on file   Years of education: Not on file   Highest education level: Not on file  Occupational History   Not on file  Tobacco Use   Smoking status: Never   Smokeless tobacco: Never  Vaping Use   Vaping Use: Never used  Substance and Sexual Activity   Alcohol use: No   Drug use: No   Sexual activity: Yes    Birth control/protection: None  Other Topics Concern   Not on file  Social History Narrative   Not on file   Social Determinants of Health   Financial Resource Strain: Not on file  Food Insecurity: Not on file  Transportation Needs: Not on file  Physical Activity: Not on file  Stress: Not on file  Social Connections: Not on file  Intimate Partner Violence: Not on file    Review of Systems: Positive for fatigue, dyspnea. All other systems reviewed and negative except where noted in HPI.   OBJECTIVE    Physical Exam: Vital signs in last 24 hours: Temp:  [97.9 F (36.6 C)-98.4 F (36.9 C)] 98.2 F (36.8 C) (02/21 1111) Pulse Rate:  [66-120] 69 (02/21 1000) Resp:   [15-34] 18 (02/21 1000) BP: (74-136)/(25-117) 84/38 (02/21 0950) SpO2:  [87 %-100 %] 94 % (02/21 1000) Weight:  [86.3 kg] 86.3 kg (02/20 2124) Last BM Date : 10/24/2021  General:  Alert female in NAD Psych:  Pleasant, cooperative. Normal mood and affect Eyes: Pupils equal, no icterus. Conjunctive pink Ears:  Normal auditory acuity Nose: No deformity, discharge or lesions Neck:  Supple, no masses felt Lungs:  Clear to auscultation.  Heart:  Regular rate. No lower extremity edema Abdomen:  Soft, nondistended, nontender,  active bowel sounds, no masses felt Rectal :  Deferred Msk: Symmetrical without gross deformities.  Neurologic:  Alert, oriented, grossly normal neurologically Skin:  Intact without significant lesions.    Scheduled inpatient medications  allopurinol  100 mg Oral Q1500   atorvastatin  10 mg Oral q AM   buPROPion  150 mg Oral BID   Chlorhexidine Gluconate Cloth  6 each Topical Daily   cholecalciferol  5,000 Units Oral q AM   multivitamin with minerals  1 tablet Oral Daily   pantoprazole  40 mg Oral Daily   potassium chloride SA  40 mEq Oral TID   venlafaxine XR  150 mg Oral Q breakfast   zolpidem  5 mg Oral QHS      Intake/Output from previous day: 02/20 0701 - 02/21 0700 In: 1689 [P.O.:120; I.V.:1254; Blood:315] Out: -  Intake/Output this shift: Total I/O In: 615.6 [I.V.:615.6] Out: 200 [Urine:200]   Lab Results: Recent Labs    10/28/2021 2138 10/28/21 0513 10/28/21 0821  WBC 13.8*  --  16.7*  HGB 8.8* 9.8* 9.6*  HCT 28.2* 32.1* 31.9*  PLT 534*  --  530*   BMET Recent Labs    10/22/2021 2138 10/28/21 0821  NA 131* 133*  K 3.1* 3.3*  CL 94* 99  CO2 24 24  GLUCOSE 127* 174*  BUN 47* 38*  CREATININE 2.78* 2.15*  CALCIUM 8.4* 8.3*   LFTs Recent Labs    10/14/2021 2138  PROT 5.9*  ALBUMIN 2.6*  AST 33  ALT 25  ALKPHOS 111  BILITOT 0.9   PT/INR No results for input(s): LABPROT, INR in the last 72 hours. Hepatitis Panel No results  for input(s): HEPBSAG, HCVAB, HEPAIGM, HEPBIGM in the last 72 hours.   . CBC Latest Ref Rng & Units 10/28/2021 10/28/2021 10/26/2021  WBC 4.0 - 10.5 K/uL 16.7(H) - 13.8(H)  Hemoglobin 12.0 - 15.0 g/dL 9.6(L) 9.8(L) 8.8(L)  Hematocrit 36.0 - 46.0 % 31.9(L) 32.1(L) 28.2(L)  Platelets 150 - 400 K/uL 530(H) - 534(H)    . CMP Latest Ref Rng & Units 10/28/2021 10/16/2021 10/16/2021  Glucose 70 - 99 mg/dL 174(H) 127(H) 116(H)  BUN 8 - 23 mg/dL 38(H) 47(H) 22  Creatinine 0.44 - 1.00 mg/dL 2.15(H) 2.78(H) 1.83(H)  Sodium 135 - 145 mmol/L 133(L) 131(L) 138  Potassium 3.5 - 5.1 mmol/L 3.3(L) 3.1(L) 4.0  Chloride 98 - 111 mmol/L 99 94(L) 105  CO2 22 - 32 mmol/L 24 24 23   Calcium 8.9 - 10.3 mg/dL 8.3(L) 8.4(L) 9.2  Total Protein 6.5 - 8.1 g/dL - 5.9(L) -  Total Bilirubin 0.3 - 1.2 mg/dL - 0.9 -  Alkaline Phos 38 - 126 U/L - 111 -  AST 15 - 41 U/L - 33 -  ALT 0 - 44 U/L - 25 -     Principal Problem:   Pericardial effusion    Tye Savoy, NP-C @  10/28/2021, 11:19 AM   Attending physician's note   I have taken history, reviewed the chart and examined the patient. I performed a substantive portion of this encounter, including complete performance of at least one of the key components, in conjunction with the APP. I agree with the Advanced Practitioner's note, impression and recommendations.   GI bleed- likely lower, but H/O ?melena is concerning. Hb 12 (09/2021) to 8.8 s/p1U to 9.6. No active bleeding. CT AP without contrast showed div and pericardial effusion.  Pericardial effusion- No tamponade on last TEE. To be re-eval on 2/23. ?etiology  CHF (EF  45-50%), PAF, CKD, ICD placement- on plavix (last dose 2/19)  Plan: -EGD/colon on 2/24 (Friday) after plavix washout and if OK with cardiology. -Certainly earlier if any active bleeding. -Continue protonix -Trend CBC   Carmell Austria, MD [facility] [phone]

## 2021-10-29 ENCOUNTER — Inpatient Hospital Stay (HOSPITAL_COMMUNITY)
Admission: AD | Disposition: E | Payer: Self-pay | Source: Other Acute Inpatient Hospital | Attending: Cardiothoracic Surgery

## 2021-10-29 DIAGNOSIS — I50810 Right heart failure, unspecified: Secondary | ICD-10-CM

## 2021-10-29 DIAGNOSIS — I3139 Other pericardial effusion (noninflammatory): Secondary | ICD-10-CM | POA: Diagnosis not present

## 2021-10-29 HISTORY — PX: RIGHT/LEFT HEART CATH AND CORONARY ANGIOGRAPHY: CATH118266

## 2021-10-29 LAB — POCT I-STAT EG7
Acid-Base Excess: 0 mmol/L (ref 0.0–2.0)
Acid-base deficit: 1 mmol/L (ref 0.0–2.0)
Bicarbonate: 23.9 mmol/L (ref 20.0–28.0)
Bicarbonate: 24.6 mmol/L (ref 20.0–28.0)
Calcium, Ion: 1.23 mmol/L (ref 1.15–1.40)
Calcium, Ion: 1.22 mmol/L (ref 1.15–1.40)
HCT: 28 % — ABNORMAL LOW (ref 36.0–46.0)
HCT: 29 % — ABNORMAL LOW (ref 36.0–46.0)
Hemoglobin: 9.5 g/dL — ABNORMAL LOW (ref 12.0–15.0)
Hemoglobin: 9.9 g/dL — ABNORMAL LOW (ref 12.0–15.0)
O2 Saturation: 49 %
O2 Saturation: 47 %
Potassium: 3.7 mmol/L (ref 3.5–5.1)
Potassium: 3.8 mmol/L (ref 3.5–5.1)
Sodium: 139 mmol/L (ref 135–145)
Sodium: 138 mmol/L (ref 135–145)
TCO2: 25 mmol/L (ref 22–32)
TCO2: 26 mmol/L (ref 22–32)
pCO2, Ven: 37.4 mmHg — ABNORMAL LOW (ref 44–60)
pCO2, Ven: 38.4 mmHg — ABNORMAL LOW (ref 44–60)
pH, Ven: 7.412 (ref 7.25–7.43)
pH, Ven: 7.413 (ref 7.25–7.43)
pO2, Ven: 26 mmHg — CL (ref 32–45)
pO2, Ven: 25 mmHg — CL (ref 32–45)

## 2021-10-29 LAB — BPAM RBC
Blood Product Expiration Date: 202302232359
ISSUE DATE / TIME: 202302210232
Unit Type and Rh: 600

## 2021-10-29 LAB — POCT I-STAT 7, (LYTES, BLD GAS, ICA,H+H)
Acid-base deficit: 5 mmol/L — ABNORMAL HIGH (ref 0.0–2.0)
Bicarbonate: 18.4 mmol/L — ABNORMAL LOW (ref 20.0–28.0)
Calcium, Ion: 0.8 mmol/L — CL (ref 1.15–1.40)
HCT: 23 % — ABNORMAL LOW (ref 36.0–46.0)
Hemoglobin: 7.8 g/dL — ABNORMAL LOW (ref 12.0–15.0)
O2 Saturation: 99 %
Potassium: 2.8 mmol/L — ABNORMAL LOW (ref 3.5–5.1)
Sodium: 147 mmol/L — ABNORMAL HIGH (ref 135–145)
TCO2: 19 mmol/L — ABNORMAL LOW (ref 22–32)
pCO2 arterial: 28.3 mmHg — ABNORMAL LOW (ref 32–48)
pH, Arterial: 7.421 (ref 7.35–7.45)
pO2, Arterial: 117 mmHg — ABNORMAL HIGH (ref 83–108)

## 2021-10-29 LAB — TYPE AND SCREEN
ABO/RH(D): A NEG
Antibody Screen: NEGATIVE
Unit division: 0

## 2021-10-29 LAB — BASIC METABOLIC PANEL WITH GFR
Anion gap: 9 (ref 5–15)
BUN: 24 mg/dL — ABNORMAL HIGH (ref 8–23)
CO2: 22 mmol/L (ref 22–32)
Calcium: 8.5 mg/dL — ABNORMAL LOW (ref 8.9–10.3)
Chloride: 103 mmol/L (ref 98–111)
Creatinine, Ser: 1.42 mg/dL — ABNORMAL HIGH (ref 0.44–1.00)
GFR, Estimated: 41 mL/min — ABNORMAL LOW
Glucose, Bld: 136 mg/dL — ABNORMAL HIGH (ref 70–99)
Potassium: 3.8 mmol/L (ref 3.5–5.1)
Sodium: 134 mmol/L — ABNORMAL LOW (ref 135–145)

## 2021-10-29 LAB — CBC
HCT: 28.8 % — ABNORMAL LOW (ref 36.0–46.0)
Hemoglobin: 8.6 g/dL — ABNORMAL LOW (ref 12.0–15.0)
MCH: 28.7 pg (ref 26.0–34.0)
MCHC: 29.9 g/dL — ABNORMAL LOW (ref 30.0–36.0)
MCV: 96 fL (ref 80.0–100.0)
Platelets: 435 10*3/uL — ABNORMAL HIGH (ref 150–400)
RBC: 3 MIL/uL — ABNORMAL LOW (ref 3.87–5.11)
RDW: 19.2 % — ABNORMAL HIGH (ref 11.5–15.5)
WBC: 12.2 10*3/uL — ABNORMAL HIGH (ref 4.0–10.5)
nRBC: 0.2 % (ref 0.0–0.2)

## 2021-10-29 SURGERY — RIGHT/LEFT HEART CATH AND CORONARY ANGIOGRAPHY
Anesthesia: LOCAL

## 2021-10-29 MED ORDER — HEPARIN SODIUM (PORCINE) 1000 UNIT/ML IJ SOLN
INTRAMUSCULAR | Status: AC
  Filled 2021-10-29: qty 10

## 2021-10-29 MED ORDER — POTASSIUM CHLORIDE CRYS ER 20 MEQ PO TBCR
40.0000 meq | EXTENDED_RELEASE_TABLET | Freq: Every day | ORAL | Status: DC
  Administered 2021-10-29 – 2021-10-31 (×3): 40 meq via ORAL
  Filled 2021-10-29 (×3): qty 2

## 2021-10-29 MED ORDER — MIDAZOLAM HCL 2 MG/2ML IJ SOLN
INTRAMUSCULAR | Status: AC
  Filled 2021-10-29: qty 2

## 2021-10-29 MED ORDER — SODIUM CHLORIDE 0.9% FLUSH: 3.0000 mL | Freq: Two times a day (BID) | INTRAVENOUS | Status: DC

## 2021-10-29 MED ORDER — EMPAGLIFLOZIN 10 MG PO TABS
10.0000 mg | ORAL_TABLET | Freq: Every day | ORAL | Status: DC
  Administered 2021-10-29 – 2021-11-03 (×6): 10 mg via ORAL
  Filled 2021-10-29 (×8): qty 1

## 2021-10-29 MED ORDER — LIP MEDEX EX OINT
TOPICAL_OINTMENT | CUTANEOUS | Status: DC | PRN
  Administered 2021-10-29: 75 via TOPICAL
  Filled 2021-10-29: qty 7

## 2021-10-29 MED ORDER — SODIUM CHLORIDE 0.9 % IV SOLN: INTRAVENOUS | Status: DC

## 2021-10-29 MED ORDER — HEPARIN (PORCINE) IN NACL 1000-0.9 UT/500ML-% IV SOLN
INTRAVENOUS | Status: DC | PRN
  Administered 2021-10-29 (×2): 500 mL

## 2021-10-29 MED ORDER — HEPARIN SODIUM (PORCINE) 1000 UNIT/ML IJ SOLN
INTRAMUSCULAR | Status: DC | PRN
  Administered 2021-10-29: 5000 [IU] via INTRAVENOUS

## 2021-10-29 MED ORDER — SODIUM CHLORIDE 0.9% FLUSH: 3.0000 mL | INTRAVENOUS | Status: DC | PRN

## 2021-10-29 MED ORDER — IOHEXOL 350 MG/ML SOLN
INTRAVENOUS | Status: DC | PRN
  Administered 2021-10-29: 30 mL

## 2021-10-29 MED ORDER — VERAPAMIL HCL 2.5 MG/ML IV SOLN
INTRAVENOUS | Status: DC | PRN
  Administered 2021-10-29: 10 mL via INTRA_ARTERIAL

## 2021-10-29 MED ORDER — HEPARIN (PORCINE) IN NACL 1000-0.9 UT/500ML-% IV SOLN
INTRAVENOUS | Status: AC
  Filled 2021-10-29: qty 1000

## 2021-10-29 MED ORDER — SODIUM CHLORIDE 0.9 % IV SOLN: 250.0000 mL | INTRAVENOUS | Status: DC | PRN

## 2021-10-29 MED ORDER — AMIODARONE HCL 200 MG PO TABS
200.0000 mg | ORAL_TABLET | Freq: Every day | ORAL | Status: DC
  Administered 2021-10-29 – 2021-11-03 (×6): 200 mg via ORAL
  Filled 2021-10-29 (×6): qty 1

## 2021-10-29 MED ORDER — TORSEMIDE 20 MG PO TABS
40.0000 mg | ORAL_TABLET | Freq: Two times a day (BID) | ORAL | Status: DC
  Administered 2021-10-29 – 2021-10-30 (×4): 40 mg via ORAL
  Filled 2021-10-29 (×4): qty 2

## 2021-10-29 MED ORDER — LIDOCAINE HCL (PF) 1 % IJ SOLN
INTRAMUSCULAR | Status: DC | PRN
  Administered 2021-10-29: 2 mL
  Administered 2021-10-29: 4 mL

## 2021-10-29 MED ORDER — LIDOCAINE HCL (PF) 1 % IJ SOLN
INTRAMUSCULAR | Status: AC
  Filled 2021-10-29: qty 30

## 2021-10-29 MED ORDER — NOREPINEPHRINE 16 MG/250ML-% IV SOLN
0.0000 ug/min | INTRAVENOUS | Status: DC
  Administered 2021-10-29: 7 ug/min via INTRAVENOUS
  Filled 2021-10-29: qty 250

## 2021-10-29 MED ORDER — VERAPAMIL HCL 2.5 MG/ML IV SOLN
INTRAVENOUS | Status: AC
  Filled 2021-10-29: qty 2

## 2021-10-29 SURGICAL SUPPLY — 13 items
CATH 5FR JL3.5 JR4 ANG PIG MP (CATHETERS) ×1 IMPLANT
CATH BALLN WEDGE 5F 110CM (CATHETERS) ×1 IMPLANT
DEVICE RAD COMP TR BAND LRG (VASCULAR PRODUCTS) ×1 IMPLANT
GLIDESHEATH SLEND SS 6F .021 (SHEATH) ×1 IMPLANT
GUIDEWIRE .025 260CM (WIRE) ×1 IMPLANT
GUIDEWIRE INQWIRE 1.5J.035X260 (WIRE) IMPLANT
INQWIRE 1.5J .035X260CM (WIRE) ×2
KIT HEART LEFT (KITS) ×1 IMPLANT
KIT MICROPUNCTURE NIT STIFF (SHEATH) ×1 IMPLANT
PACK CARDIAC CATHETERIZATION (CUSTOM PROCEDURE TRAY) ×3 IMPLANT
SHEATH GLIDE SLENDER 4/5FR (SHEATH) ×2 IMPLANT
SHEATH PROBE COVER 6X72 (BAG) ×1 IMPLANT
TRANSDUCER W/STOPCOCK (MISCELLANEOUS) ×3 IMPLANT

## 2021-10-29 NOTE — Progress Notes (Addendum)
Progress Note  Patient Name: [name] Date of Encounter: 10/26/2021  [facility] Cardiologist: None    Subjective   No acute events overnight.  Feels well without dyspnea.  No recurrent LGI bleeding.  Remains on NE.  CVP this AM 60mmHg with CV waves.  TTE yesterday with EF 35-40% with RV dilatation, severe TR and TAPSE 1.6, moderate effusion without tamponade     Inpatient Medications    Scheduled Meds:  allopurinol  100 mg Oral Q1500   atorvastatin  10 mg Oral q AM   buPROPion  150 mg Oral BID   Chlorhexidine Gluconate Cloth  6 each Topical Daily   cholecalciferol  5,000 Units Oral q AM   multivitamin with minerals  1 tablet Oral Daily   pantoprazole  40 mg Oral Daily   venlafaxine XR  150 mg Oral Q breakfast   zolpidem  5 mg Oral QHS   Continuous Infusions:  sodium chloride 100 mL/hr at 11/03/2021 8101   norepinephrine (LEVOPHED) Adult infusion 7 mcg/min (10/08/2021 0600)   PRN Meds: acetaminophen, nitroGLYCERIN, ondansetron (ZOFRAN) IV   Vital Signs    Vitals:   11/03/2021 0520 10/23/2021 0600 10/23/2021 0633 10/13/2021 0641  BP: (!) 103/51  (!) 112/93 (!) 92/56  Pulse: 73 80 74 78  Resp: (!) 23 (!) 23 (!) 21 17  Temp:      TempSrc:      SpO2: 98% 90% 98% 90%  Weight:  89.5 kg    Height:        Intake/Output Summary (Last 24 hours) at 10/20/2021 0726 Last data filed at 10/28/2021 0600 Gross per 24 hour  Intake 2995.74 ml  Output 700 ml  Net 2295.74 ml   Last 3 Weights 10/09/2021 10/26/2021 10/16/2021  Weight (lbs) 197 lb 5 oz 190 lb 4.1 oz 198 lb 9.6 oz  Weight (kg) 89.5 kg 86.3 kg 90.084 kg      Telemetry    Paced - Personally Reviewed  ECG    None today  Physical Exam   GEN: No acute distress.   Neck: RIJ line Cardiac: RRR, 3/6 holosystolic murmur.  Respiratory: Clear to auscultation bilaterally. GI: Soft, nontender, non-distended  MS: No edema; No deformity. Neuro:  Nonfocal  Psych: Normal affect   Labs    High Sensitivity  Troponin:   Recent Labs  Lab 10/28/21 0000  TROPONINIHS 11     Chemistry Recent Labs  Lab 10/17/2021 2138 10/28/21 0821 10/21/2021 0345  NA 131* 133* 134*  K 3.1* 3.3* 3.8  CL 94* 99 103  CO2 24 24 22   GLUCOSE 127* 174* 136*  BUN 47* 38* 24*  CREATININE 2.78* 2.15* 1.42*  CALCIUM 8.4* 8.3* 8.5*  PROT 5.9*  --   --   ALBUMIN 2.6*  --   --   AST 33  --   --   ALT 25  --   --   ALKPHOS 111  --   --   BILITOT 0.9  --   --   GFRNONAA 18* 25* 41*  ANIONGAP 13 10 9     Lipids  Recent Labs  Lab 10/28/21 0821  CHOL 130  TRIG 153*  HDL 27*  LDLCALC 72  CHOLHDL 4.8    Hematology Recent Labs  Lab 10/21/2021 2138 10/28/21 0513 10/28/21 0821 11/02/2021 0345  WBC 13.8*  --  16.7* 12.2*  RBC 3.00*  --  3.39* 3.00*  HGB 8.8* 9.8* 9.6* 8.6*  HCT 28.2* 32.1* 31.9* 28.8*  MCV 94.0  --  94.1 96.0  MCH 29.3  --  28.3 28.7  MCHC 31.2  --  30.1 29.9*  RDW 18.2*  --  18.1* 19.2*  PLT 534*  --  530* 435*   Thyroid No results for input(s): TSH, FREET4 in the last 168 hours.  BNP Recent Labs  Lab 10/28/2021 2138  BNP 477.9*    DDimer No results for input(s): DDIMER in the last 168 hours.   Radiology      Cardiac Studies   TTE 10/28/21  1. Left ventricular ejection fraction, by estimation, is 35 to 40%. The  left ventricle has moderately decreased function. The left ventricle  demonstrates global hypokinesis. Left ventricular diastolic parameters are  consistent with Grade II diastolic  dysfunction (pseudonormalization). Elevated left atrial pressure. There is  the interventricular septum is flattened in diastole ('D' shaped left  ventricle), consistent with right ventricular volume overload.   2. Right ventricular systolic function is low normal. The right  ventricular size is severely enlarged. A is visualized in the coronary  sinus. There is normal pulmonary artery systolic pressure.   3. Left atrial size was mildly dilated.   4. Right atrial size was severely dilated.    5. Moderate pericardial effusion. The pericardial effusion is  circumferential. There is no evidence of cardiac tamponade.   6. The mitral valve is myxomatous. Moderate mitral valve regurgitation.   7. Flail posterior tricuspid valve leaflet. The tricuspid valve is  abnormal. Tricuspid valve regurgitation is severe.   8. The aortic valve is tricuspid. Aortic valve regurgitation is not  visualized. Aortic valve sclerosis is present, with no evidence of aortic  valve stenosis.   9. The inferior vena cava is dilated in size with <50% respiratory  variability, suggesting right atrial pressure of 15 mmHg.   [place] 10/19 RA = 11 RV = 27/5 PA = 24/3 (17) PCW = 9 Fick cardiac output/index = 4.8/2.5 PVR = 1..8 WU Ao sat = 98% PA sat = 66%, 67%  Patient Profile     66 y.o. female with paroxysmal atrial fibrillation status post failed ablation, status post Watchman December 2022, breast cancer status post Adriamycin therapy, nonischemic cardiomyopathy with ejection fraction 25 to 30% (on TEE improved), status post BiV Medtronic ICD, hypertension, obstructive sleep apnea, and chronic kidney disease here with lower GI bleeding and pericardial effusion.  Assessment & Plan    1.  Pericardial effusion.  Repeat echocardiogram within the next few days to evaluate.  Unclear etiology. 2.  Hypotension:  Unclear etiology.  Volume up overnight.  Hgb relatively stable (though a bit lower).  EF 35-40% and LV does not look underfilled.  While no echo findings of tamponade, RHC would help better assess for tamponade (pressure equalization or near equalization) and characterize for any PA vs PVHTN. 3.  Nonischemic cardiomyopathy.  BP still low.  On review of patient's chart, BP typically in more normal range.  Wean NE to sys BP 63mmHg.  See discussion above. 3.  GI bleed: Appreciate GI consult, holding plavix.  EGD/c-scope Friday. 4.  Chronic kidney disease: Cr improved today.  Restart home torsemide and try to keep  net even 5.  Atrial fibrillation status post Watchman: Hold Plavix for now; TEE demonstrated no leaks in January. 6.  Severe TR:  After RHC, may need to consider referral for tTEER given severe TR (may be related to PPM lead) while RV function is still relatively perserved.  For questions or updates, please contact  [facility] Please consult [email or web address] for contact info under        Signed, Early Osmond, MD  10/08/2021, 7:26 AM    CRITICAL CARE Performed by: Lenna Sciara   Total critical care time: 35 minutes. Critical care time was exclusive of separately billable procedures and treating other patients. Critical care was necessary to treat or prevent imminent or life-threatening deterioration. Critical care was time spent personally by me on the following activities: development of treatment plan with patient and/or surrogate as well as nursing, discussions with consultants, evaluation of patient's response to treatment, examination of patient, obtaining history from patient or surrogate, ordering and performing treatments and interventions, ordering and review of laboratory studies, ordering and review of radiographic studies, pulse oximetry and re-evaluation of patient's condition.

## 2021-10-29 NOTE — Progress Notes (Signed)
Daily Rounding Note  10/30/2021, 9:37 AM  LOS: 3 days   SUBJECTIVE:   Chief complaint:    Hematochezia.  Chronic Plavix on hold.  Severe tricuspid regurgitation.  Patient's blood pressures remain soft.  Currently in the low 100s/60 but readings as low as 91/46 w heart rate 116 this morning.  Hypokalemia at 2.4 this morning. Got oral and IV potassium Stool yesterday PM was dark.   Pt feeling well.  No signif dyspnea.  No abd pain.   No dizziness  OBJECTIVE:         Vital signs in last 24 hours:    Temp:  [97.6 F (36.4 C)-98 F (36.7 C)] 97.6 F (36.4 C) (02/23 0755) Pulse Rate:  [67-116] 109 (02/23 0915) Resp:  [14-29] 22 (02/23 0915) BP: (74-148)/(31-127) 104/59 (02/23 0915) SpO2:  [80 %-100 %] 100 % (02/23 0915) Arterial Line BP: (84-174)/(43-100) 84/68 (02/22 2000) Weight:  [87.3 kg] 87.3 kg (02/23 0600) Last BM Date : 10/09/2021 Filed Weights   10/30/2021 2124 10/26/2021 0600 10/30/21 0600  Weight: 86.3 kg 89.5 kg 87.3 kg   General: Looks better than history would suggest.  Not acutely ill appearing, comfortable and in no distress. Heart: RRR.  2/6 systolic murmur. Chest: No labored breathing or cough.  Lungs clear bilaterally. Abdomen: No tenderness or distention.  Active bowel sounds. Extremities: No CCE. Neuro/Psych: Alert, pleasant.  Fully oriented.  Fluid speech.  No gross deficits.  No tremors  Intake/Output from previous day: 02/22 0701 - 02/23 0700 In: 521.8 [P.O.:180; I.V.:341.8] Out: 1900 [Urine:1900]  Intake/Output this shift: No intake/output data recorded.  Lab Results: Recent Labs    10/28/21 0821 10/08/2021 0345 10/30/2021 1240 10/26/2021 1250 10/21/2021 1251 10/30/21 0400  WBC 16.7* 12.2*  --   --   --  7.1  HGB 9.6* 8.6*   < > 9.5* 9.9* 8.0*  HCT 31.9* 28.8*   < > 28.0* 29.0* 26.9*  PLT 530* 435*  --   --   --  329   < > = values in this interval not displayed.   BMET Recent Labs     10/28/21 0821 10/26/2021 0345 10/20/2021 1240 10/28/2021 1250 10/11/2021 1251 10/30/21 0400  NA 133* 134*   < > 139 138 136  K 3.3* 3.8   < > 3.7 3.8 2.4*  CL 99 103  --   --   --  100  CO2 24 22  --   --   --  28  GLUCOSE 174* 136*  --   --   --  92  BUN 38* 24*  --   --   --  18  CREATININE 2.15* 1.42*  --   --   --  1.45*  CALCIUM 8.3* 8.5*  --   --   --  8.2*   < > = values in this interval not displayed.   LFT Recent Labs    11/02/2021 2138  PROT 5.9*  ALBUMIN 2.6*  AST 33  ALT 25  ALKPHOS 111  BILITOT 0.9   PT/INR Recent Labs    10/30/21 0400  LABPROT 15.0  INR 1.2   Hepatitis Panel No results for input(s): HEPBSAG, HCVAB, HEPAIGM, HEPBIGM in the last 72 hours.  Studies/Results: DG CHEST PORT 1 VIEW  Result Date: 10/28/2021 CLINICAL DATA:  Central line placement. EXAM: PORTABLE CHEST 1 VIEW COMPARISON:  PA Lat 08/21/2021. FINDINGS: Right IJ central line is in position with tip  in the right atrium. There is no pneumothorax. Right chest dual lead pacing system with AID wiring and wire insertions are similar. The heart again noted enlarged but the central vessels are normal caliber. Since the prior study a left atrial appendage occlusion device has been inserted. The lungs are generally clear. No significant pleural effusion is evident. Overlying metallic structures noted on the right inferiorly. Multiple overlying monitor leads. IMPRESSION: 1. Right IJ central line tip in the right atrium. 2. Cardiomegaly, with normal caliber central vessels. 3. Interval insertion of a left atrial appendage occlusion device. Electronically Signed   By: Telford Nab M.D.   On: 10/28/2021 01:24   ECHOCARDIOGRAM COMPLETE  Result Date: 10/28/2021 IMPRESSIONS  1. Left ventricular ejection fraction, by estimation, is 35 to 40%. The left ventricle has moderately decreased function. The left ventricle demonstrates global hypokinesis. Left ventricular diastolic parameters are consistent with Grade II  diastolic dysfunction (pseudonormalization). Elevated left atrial pressure. There is the interventricular septum is flattened in diastole ('D' shaped left ventricle), consistent with right ventricular volume overload.  2. Right ventricular systolic function is low normal. The right ventricular size is severely enlarged. A is visualized in the coronary sinus. There is normal pulmonary artery systolic pressure.  3. Left atrial size was mildly dilated.  4. Right atrial size was severely dilated.  5. Moderate pericardial effusion. The pericardial effusion is circumferential. There is no evidence of cardiac tamponade.  6. The mitral valve is myxomatous. Moderate mitral valve regurgitation.  7. Flail posterior tricuspid valve leaflet. The tricuspid valve is abnormal. Tricuspid valve regurgitation is severe.  8. The aortic valve is tricuspid. Aortic valve regurgitation is not visualized. Aortic valve sclerosis is present, with no evidence of aortic valve stenosis.  9. The inferior vena cava is dilated in size with <50% respiratory variability, suggesting right atrial pressure of 15 mmHg. 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 This may allow equalization of atrial pressures and mask respiratory flow variation as a sign of tamponade. Right heart volume overload may reduce the occurence of right heart chamber collapse. Serial studies are recommended.  Dani Gobble Croitoru MD Electronically signed by Sanda Klein MD Signature Date/Time: 10/28/2021/10:10:43 AM    Final     Scheduled Meds:  allopurinol  100 mg Oral Q1500   amiodarone  200 mg Oral Daily   atorvastatin  10 mg Oral q AM   buPROPion  150 mg Oral BID   Chlorhexidine Gluconate Cloth  6 each Topical Daily   cholecalciferol  5,000 Units Oral q AM   empagliflozin  10 mg Oral Daily   multivitamin with minerals  1  tablet Oral Daily   pantoprazole  40 mg Oral Daily   potassium chloride SA  40 mEq Oral Daily   torsemide  40 mg Oral BID   venlafaxine XR  150 mg Oral Q breakfast   zolpidem  5 mg Oral QHS   Continuous Infusions:  potassium chloride 10 mEq (10/30/21 0856)   PRN Meds:.acetaminophen, lip balm, nitroGLYCERIN, ondansetron (ZOFRAN) IV   ASSESMENT:   Painless hematochezia.    [place] anemia.  Hgb decline: 9.9 ... 8 in last 24 hours.      Asymptomatic GERD, continues on chronic Protonix 40 mg/daily.    Chronic Plavix.  On hold.    Pericardial effusion.  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  ICD in situ.  PAF.  Watchman procedure for PAF 08/2021.  Severe tricuspid regurgitation.  R heart cardiac cath 10/21/2021: Severe tricuspid regurgitation with flail leaflet, significant RV dysfunction.  Dr. Darcey Nora feels pt would benefit from tricuspid valve repair-replacement, possible maze procedure after GI bleeding evaluated and treated.  Patient's Plavix is on hold.     AKI, creatinine/BUN overall improved.  Hypokalemia.    Hx breast cancer.     PLAN     Plan EGD, colonoscopy on 2/24, Friday.  Timing TBD but probably midmorning.  See orders for Dulcolax, movie prep, Reglan.  Switched diet to clear liquids.    Azucena Freed  10/30/2021, 9:37 AM Phone [phone]

## 2021-10-29 NOTE — Progress Notes (Addendum)
Advanced Heart Failure Rounding Note  PCP-Cardiologist: None   Subjective:   02/20: Transferred to [facility] for GI bleed + pericardial effusion  NE down to 7. BP dropped with attempting to wean down further overnight.  No recurrent melena. Hgb stable at 8.6.   Denies dyspnea. CVP 18.     Objective:   Weight Range: 89.5 kg Body mass index is 36.09 kg/m.   Vital Signs:   Temp:  [97.7 F (36.5 C)-98.2 F (36.8 C)] 97.8 F (36.6 C) (02/22 0746) Pulse Rate:  [63-83] 70 (02/22 0840) Resp:  [12-38] 26 (02/22 0840) BP: (71-126)/(24-101) 91/47 (02/22 0840) SpO2:  [71 %-100 %] 100 % (02/22 0840) Weight:  [89.5 kg] 89.5 kg (02/22 0600) Last BM Date : 10/17/2021  Weight change: Filed Weights   10/22/2021 2124 10/21/2021 0600  Weight: 86.3 kg 89.5 kg    Intake/Output:   Intake/Output Summary (Last 24 hours) at 11/02/2021 0905 Last data filed at 10/24/2021 0800 Gross per 24 hour  Intake 2723.94 ml  Output 500 ml  Net 2223.94 ml      Physical Exam    General:  No distress. Lying in bed. HEENT: normal Neck: supple. JVP to ear. Carotids 2+ bilat; no bruits.  Cor: PMI nondisplaced. Regular rate & rhythm. No rubs, gallops, + TR murmur Lungs: clear Abdomen: soft, nontender, nondistended. No hepatosplenomegaly. No bruits or masses. Good bowel sounds. Extremities: no cyanosis, clubbing, rash, edema Neuro: alert & orientedx3, cranial nerves grossly intact. moves all 4 extremities w/o difficulty. Affect pleasant    Telemetry   Vpaced 70s   Labs    CBC Recent Labs    10/28/21 0821 11/03/2021 0345  WBC 16.7* 12.2*  HGB 9.6* 8.6*  HCT 31.9* 28.8*  MCV 94.1 96.0  PLT 530* 737*   Basic Metabolic Panel Recent Labs    10/28/21 0821 10/23/2021 0345  NA 133* 134*  K 3.3* 3.8  CL 99 103  CO2 24 22  GLUCOSE 174* 136*  BUN 38* 24*  CREATININE 2.15* 1.42*  CALCIUM 8.3* 8.5*   Liver Function Tests Recent Labs    10/22/2021 2138  AST 33  ALT 25  ALKPHOS 111  BILITOT  0.9  PROT 5.9*  ALBUMIN 2.6*   No results for input(s): LIPASE, AMYLASE in the last 72 hours. Cardiac Enzymes No results for input(s): CKTOTAL, CKMB, CKMBINDEX, TROPONINI in the last 72 hours.  BNP: BNP (last 3 results) Recent Labs    02/24/21 1357 07/16/21 1145 10/25/2021 2138  BNP 326.4* 523.7* 477.9*    ProBNP (last 3 results) No results for input(s): PROBNP in the last 8760 hours.   D-Dimer No results for input(s): DDIMER in the last 72 hours. Hemoglobin A1C No results for input(s): HGBA1C in the last 72 hours. Fasting Lipid Panel Recent Labs    10/28/21 0821  CHOL 130  HDL 27*  LDLCALC 72  TRIG 153*  CHOLHDL 4.8   Thyroid Function Tests No results for input(s): TSH, T4TOTAL, T3FREE, THYROIDAB in the last 72 hours.  Invalid input(s): FREET3  Other results:   Imaging    No results found.   Medications:     Scheduled Medications:  allopurinol  100 mg Oral Q1500   atorvastatin  10 mg Oral q AM   buPROPion  150 mg Oral BID   Chlorhexidine Gluconate Cloth  6 each Topical Daily   cholecalciferol  5,000 Units Oral q AM   multivitamin with minerals  1 tablet Oral Daily  pantoprazole  40 mg Oral Daily   torsemide  40 mg Oral BID   venlafaxine XR  150 mg Oral Q breakfast   zolpidem  5 mg Oral QHS    Infusions:  sodium chloride 100 mL/hr at 10/09/2021 0800   norepinephrine (LEVOPHED) Adult infusion 7 mcg/min (10/30/2021 0800)    PRN Medications: acetaminophen, lip balm, nitroGLYCERIN, ondansetron (ZOFRAN) IV   Assessment/Plan   Pericardial effusion: -Doubt from Watchman procedure in 12/22 -Bedside echo 02/20 with suspicion for tamponade.  -Holding diuretics and antihypertensives -Effusion appears moderate in size on echo 02/21. There does not appear to be any evidence of tamponade. Monitor closely. If hemodynamic instability, may require pericardiocentesis -will assess hemodynamics on RHC today -Plavix now on hold  2. Chronic systolic CHF: -  felt to have LBBB CM vs chemo induced (got adriamycin). 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 No effusion -Echo 02/21: EF 35-40%, RV severely enlarged with okay function, moderate MR, severe TR with flail posterior leaflet -Difficulty weaning off NE overnight. CVP 18 in setting of severe TR. Volume appears up. Cards restarted home Torsemide 40 BID -RHC today or tomorrow to assess assess filling pressure and hemodynamics. May need to consider TR repair -Home spiro and bisoprolol held d/t hypotension -Resume Jardiance 10 mg daily  3. Severe TR - See discussion above - RHC  4. PAF - Unable to complete ablation 10/22 d/t pericardial effusion - S/p Watchman implant 08/21/21 - Resume home amiodarone 200 mg daily  5. 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 GI bleeding - BRBPR + melena 4-5 days prior to admission - Per her report CT at OSH with diverticulosis but no diverticulitis - Denies NSAID use - 1 u PRBCs this admit - Hgb stable 8.6 - GI has seen. Planning for EGD/colon on 02/24 - Holding plavix  6. OSA - Unable to tolerate CPAP  Length of Stay: 2  FINCH, LINDSAY N, PA-C  10/15/2021, 9:05 AM  Advanced Heart Failure Team Pager [phone] (M-F; 7a - 5p)  Please contact [facility] for night-coverage after hours (5p -7a ) and weekends on [email or web address]   Agree with above.    Remains on NE. Unable to wean fully. No co-ox drawn. CVP 18 Denies CP or SOB. Not tachycardic. Renal function improved  General:  sitting up No resp difficulty HEENT: normal Neck: supple. RIJ TLC. JVP to ear Carotids 2+ bilat; no bruits. No lymphadenopathy or thryomegaly appreciated. Cor: PMI  nondisplaced. Regular rate & rhythm. 2/6 TR Lungs: clear Abdomen: soft, nontender, nondistended. No hepatosplenomegaly. No bruits or masses. Good bowel sounds. Extremities: no cyanosis, clubbing, rash, edema Neuro: alert & orientedx3, cranial nerves grossly intact. moves all 4 extremities w/o difficulty. Affect pleasant  Remains pressor dependent. Doubt tamponade. I reviewed recent TEE and d/e Dr. Ali Lowe. I suspect main issue is severe TR with markedly elevated RA pressures and inability to drain pericardium. TEE suggests possible torn chords on TV.   Will plan R/L cath today and then discuss next steps further with valvular/structural team regarding next steps with possible surgical TV annuloplasty vs percutaneous options.   CRITICAL CARE Performed by: Glori Bickers  Total critical care time: 35 minutes  Critical care time was exclusive of separately billable procedures and treating other patients.  Critical care was necessary to treat or prevent imminent or life-threatening deterioration.  Critical care was time spent personally by me (independent of midlevel providers or residents) on the following activities: development of treatment plan with patient and/or surrogate as well as nursing, discussions with consultants, evaluation of patient's response to treatment, examination of patient, obtaining history from patient or surrogate, ordering and performing treatments and interventions, ordering and review of laboratory studies, ordering and review of radiographic studies, pulse oximetry and re-evaluation of patient's condition.  Glori Bickers, MD  11:02 AM

## 2021-10-29 NOTE — H&P (View-Only) (Signed)
Advanced Heart Failure Rounding Note  PCP-Cardiologist: None   Subjective:   02/20: Transferred to [facility] for GI bleed + pericardial effusion  NE down to 7. BP dropped with attempting to wean down further overnight.  No recurrent melena. Hgb stable at 8.6.   Denies dyspnea. CVP 18.     Objective:   Weight Range: 89.5 kg Body mass index is 36.09 kg/m.   Vital Signs:   Temp:  [97.7 F (36.5 C)-98.2 F (36.8 C)] 97.8 F (36.6 C) (02/22 0746) Pulse Rate:  [63-83] 70 (02/22 0840) Resp:  [12-38] 26 (02/22 0840) BP: (71-126)/(24-101) 91/47 (02/22 0840) SpO2:  [71 %-100 %] 100 % (02/22 0840) Weight:  [89.5 kg] 89.5 kg (02/22 0600) Last BM Date : 10/20/2021  Weight change: Filed Weights   10/18/2021 2124 10/19/2021 0600  Weight: 86.3 kg 89.5 kg    Intake/Output:   Intake/Output Summary (Last 24 hours) at 10/30/2021 0905 Last data filed at 10/18/2021 0800 Gross per 24 hour  Intake 2723.94 ml  Output 500 ml  Net 2223.94 ml      Physical Exam    General:  No distress. Lying in bed. HEENT: normal Neck: supple. JVP to ear. Carotids 2+ bilat; no bruits.  Cor: PMI nondisplaced. Regular rate & rhythm. No rubs, gallops, + TR murmur Lungs: clear Abdomen: soft, nontender, nondistended. No hepatosplenomegaly. No bruits or masses. Good bowel sounds. Extremities: no cyanosis, clubbing, rash, edema Neuro: alert & orientedx3, cranial nerves grossly intact. moves all 4 extremities w/o difficulty. Affect pleasant    Telemetry   Vpaced 70s   Labs    CBC Recent Labs    10/28/21 0821 10/19/2021 0345  WBC 16.7* 12.2*  HGB 9.6* 8.6*  HCT 31.9* 28.8*  MCV 94.1 96.0  PLT 530* 921*   Basic Metabolic Panel Recent Labs    10/28/21 0821 11/01/2021 0345  NA 133* 134*  K 3.3* 3.8  CL 99 103  CO2 24 22  GLUCOSE 174* 136*  BUN 38* 24*  CREATININE 2.15* 1.42*  CALCIUM 8.3* 8.5*   Liver Function Tests Recent Labs    10/17/2021 2138  AST 33  ALT 25  ALKPHOS 111  BILITOT  0.9  PROT 5.9*  ALBUMIN 2.6*   No results for input(s): LIPASE, AMYLASE in the last 72 hours. Cardiac Enzymes No results for input(s): CKTOTAL, CKMB, CKMBINDEX, TROPONINI in the last 72 hours.  BNP: BNP (last 3 results) Recent Labs    02/24/21 1357 07/16/21 1145 10/15/2021 2138  BNP 326.4* 523.7* 477.9*    ProBNP (last 3 results) No results for input(s): PROBNP in the last 8760 hours.   D-Dimer No results for input(s): DDIMER in the last 72 hours. Hemoglobin A1C No results for input(s): HGBA1C in the last 72 hours. Fasting Lipid Panel Recent Labs    10/28/21 0821  CHOL 130  HDL 27*  LDLCALC 72  TRIG 153*  CHOLHDL 4.8   Thyroid Function Tests No results for input(s): TSH, T4TOTAL, T3FREE, THYROIDAB in the last 72 hours.  Invalid input(s): FREET3  Other results:   Imaging    No results found.   Medications:     Scheduled Medications:  allopurinol  100 mg Oral Q1500   atorvastatin  10 mg Oral q AM   buPROPion  150 mg Oral BID   Chlorhexidine Gluconate Cloth  6 each Topical Daily   cholecalciferol  5,000 Units Oral q AM   multivitamin with minerals  1 tablet Oral Daily  pantoprazole  40 mg Oral Daily   torsemide  40 mg Oral BID   venlafaxine XR  150 mg Oral Q breakfast   zolpidem  5 mg Oral QHS    Infusions:  sodium chloride 100 mL/hr at 10/17/2021 0800   norepinephrine (LEVOPHED) Adult infusion 7 mcg/min (10/24/2021 0800)    PRN Medications: acetaminophen, lip balm, nitroGLYCERIN, ondansetron (ZOFRAN) IV   Assessment/Plan   Pericardial effusion: -Doubt from Watchman procedure in 12/22 -Bedside echo 02/20 with suspicion for tamponade.  -Holding diuretics and antihypertensives -Effusion appears moderate in size on echo 02/21. There does not appear to be any evidence of tamponade. Monitor closely. If hemodynamic instability, may require pericardiocentesis -will assess hemodynamics on RHC today -Plavix now on hold  2. Chronic systolic CHF: -  felt to have LBBB CM vs chemo induced (got adriamycin). 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 No effusion -Echo 02/21: EF 35-40%, RV severely enlarged with okay function, moderate MR, severe TR with flail posterior leaflet -Difficulty weaning off NE overnight. CVP 18 in setting of severe TR. Volume appears up. Cards restarted home Torsemide 40 BID -RHC today or tomorrow to assess assess filling pressure and hemodynamics. May need to consider TR repair -Home spiro and bisoprolol held d/t hypotension -Resume Jardiance 10 mg daily  3. Severe TR - See discussion above - RHC  4. PAF - Unable to complete ablation 10/22 d/t pericardial effusion - S/p Watchman implant 08/21/21 - Resume home amiodarone 200 mg daily  5. 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 GI bleeding - BRBPR + melena 4-5 days prior to admission - Per her report CT at OSH with diverticulosis but no diverticulitis - Denies NSAID use - 1 u PRBCs this admit - Hgb stable 8.6 - GI has seen. Planning for EGD/colon on 02/24 - Holding plavix  6. OSA - Unable to tolerate CPAP  Length of Stay: 2  FINCH, LINDSAY N, PA-C  10/24/2021, 9:05 AM  Advanced Heart Failure Team Pager [phone] (M-F; 7a - 5p)  Please contact [facility] for night-coverage after hours (5p -7a ) and weekends on [email or web address]   Agree with above.    Remains on NE. Unable to wean fully. No co-ox drawn. CVP 18 Denies CP or SOB. Not tachycardic. Renal function improved  General:  sitting up No resp difficulty HEENT: normal Neck: supple. RIJ TLC. JVP to ear Carotids 2+ bilat; no bruits. No lymphadenopathy or thryomegaly appreciated. Cor: PMI  nondisplaced. Regular rate & rhythm. 2/6 TR Lungs: clear Abdomen: soft, nontender, nondistended. No hepatosplenomegaly. No bruits or masses. Good bowel sounds. Extremities: no cyanosis, clubbing, rash, edema Neuro: alert & orientedx3, cranial nerves grossly intact. moves all 4 extremities w/o difficulty. Affect pleasant  Remains pressor dependent. Doubt tamponade. I reviewed recent TEE and d/e Dr. Ali Lowe. I suspect main issue is severe TR with markedly elevated RA pressures and inability to drain pericardium. TEE suggests possible torn chords on TV.   Will plan R/L cath today and then discuss next steps further with valvular/structural team regarding next steps with possible surgical TV annuloplasty vs percutaneous options.   CRITICAL CARE Performed by: Glori Bickers  Total critical care time: 35 minutes  Critical care time was exclusive of separately billable procedures and treating other patients.  Critical care was necessary to treat or prevent imminent or life-threatening deterioration.  Critical care was time spent personally by me (independent of midlevel providers or residents) on the following activities: development of treatment plan with patient and/or surrogate as well as nursing, discussions with consultants, evaluation of patient's response to treatment, examination of patient, obtaining history from patient or surrogate, ordering and performing treatments and interventions, ordering and review of laboratory studies, ordering and review of radiographic studies, pulse oximetry and re-evaluation of patient's condition.  Glori Bickers, MD  11:02 AM

## 2021-10-29 NOTE — Interval H&P Note (Signed)
History and Physical Interval Note:  10/15/2021 12:18 PM  [name]  has presented today for surgery, with the diagnosis of pericardial effusion, tricuspid regurgitation.  The various methods of treatment have been discussed with the patient and family. After consideration of risks, benefits and other options for treatment, the patient has consented to  Procedure(s): RIGHT/LEFT HEART CATH AND CORONARY ANGIOGRAPHY (N/A) and possible coronary angioplasty as a surgical intervention.  The patient's history has been reviewed, patient examined, no change in status, stable for surgery.  I have reviewed the patient's chart and labs.  Questions were answered to the patient's satisfaction.     Londen Lorge

## 2021-10-29 NOTE — TOC Initial Note (Addendum)
Transition of Care [facility]) - Initial/Assessment Note    Patient Details  Name: [name] MRN: [id number] Date of Birth: [birth year]  [facility] of [facility]) CM/SW Contact:    Erenest Rasher, RN Phone Number: [phone] 10/30/2021, 7:57 AM  Clinical Narrative:                 HF TOC CM spoke to pt at bedside. States she was independent at home prior to hospital stay. Husband at home to assist with care. Pt may possible need oxygen for home. Will continue to follow for dc needs.   Expected Discharge Plan: Home/Self Care Barriers to Discharge: Continued Medical Work up   Patient Goals and CMS Choice Patient states their goals for this hospitalization and ongoing recovery are:: remain independent at home CMS Medicare.gov Compare Post Acute Care list provided to:: Patient    Expected Discharge Plan and Services Expected Discharge Plan: Home/Self Care   Discharge Planning Services: CM Consult                                          Prior Living Arrangements/Services   Lives with:: Spouse Patient language and need for interpreter reviewed:: Yes Do you feel safe going back to the place where you live?: Yes      Need for Family Participation in Patient Care: No (Comment) Care giver support system in place?: No (comment)   Criminal Activity/Legal Involvement Pertinent to Current Situation/Hospitalization: No - Comment as needed  Activities of Daily Living Home Assistive Devices/Equipment: None ADL Screening (condition at time of admission) Patient's cognitive ability adequate to safely complete daily activities?: Yes Is the patient deaf or have difficulty hearing?: No Does the patient have difficulty seeing, even when wearing glasses/contacts?: No Does the patient have difficulty concentrating, remembering, or making decisions?: No Patient able to express need for assistance with ADLs?: No Does the patient have difficulty dressing or bathing?:  No Independently performs ADLs?: Yes (appropriate for developmental age) Does the patient have difficulty walking or climbing stairs?: No Weakness of Legs: None Weakness of Arms/Hands: None  Permission Sought/Granted Permission sought to share information with : Case Manager, PCP, Family Supports Permission granted to share information with : Yes, Verbal Permission Granted  Share Information with NAME: Khalaya Mcgurn     Permission granted to share info w Relationship: husband  Permission granted to share info w Contact Information: [phone]  Emotional Assessment Appearance:: Appears stated age Attitude/Demeanor/Rapport: Engaged Affect (typically observed): Accepting Orientation: : Oriented to Self, Oriented to Place, Oriented to  Time, Oriented to Situation   Psych Involvement: No (comment)  Admission diagnosis:  Pericardial effusion [I31.39] Patient Active Problem List   Diagnosis Date Noted   Pericardial effusion 10/26/2021   Presence of Watchman left atrial appendage closure device    Atrial fibrillation ([place]) 06/30/2021   Paroxysmal atrial fibrillation ([place]) 04/14/2021   Secondary hypercoagulable state ([place]) 04/14/2021   OSA (obstructive sleep apnea)    ICD (implantable cardioverter-defibrillator) lead failure 10/18/2014   Fatigue 66/44/0347   Chronic systolic heart failure ([place]) 42/59/5638   Complications due to automatic implantable cardioverter-defibrillator    Biventricular implantable cardioverter-defibrillator-medtronic    Nonischemic cardiomyopathy ([facility])    NSVT (nonsustained ventricular tachycardia)    HYPOTENSION 08/12/2010   OVERWEIGHT/OBESITY 04/02/2009   SHORTNESS OF BREATH 01/15/2009   PCP:  Wayna Chalet, NP Pharmacy:   [facility] [place], [place] [address] Phone: [phone] Fax: [phone]  [id number] [address], [place] [address] Phone: [phone] Fax: [phone]     Social Determinants of Health (SDOH) Interventions    Readmission Risk Interventions No flowsheet data found.

## 2021-10-30 ENCOUNTER — Encounter (HOSPITAL_COMMUNITY): Payer: Self-pay | Admitting: Internal Medicine

## 2021-10-30 DIAGNOSIS — I361 Nonrheumatic tricuspid (valve) insufficiency: Secondary | ICD-10-CM

## 2021-10-30 DIAGNOSIS — I428 Other cardiomyopathies: Secondary | ICD-10-CM

## 2021-10-30 DIAGNOSIS — I4891 Unspecified atrial fibrillation: Secondary | ICD-10-CM

## 2021-10-30 DIAGNOSIS — I3139 Other pericardial effusion (noninflammatory): Secondary | ICD-10-CM | POA: Diagnosis not present

## 2021-10-30 LAB — CBC
HCT: 26.9 % — ABNORMAL LOW (ref 36.0–46.0)
Hemoglobin: 8 g/dL — ABNORMAL LOW (ref 12.0–15.0)
MCH: 28.7 pg (ref 26.0–34.0)
MCHC: 29.7 g/dL — ABNORMAL LOW (ref 30.0–36.0)
MCV: 96.4 fL (ref 80.0–100.0)
Platelets: 329 10*3/uL (ref 150–400)
RBC: 2.79 MIL/uL — ABNORMAL LOW (ref 3.87–5.11)
RDW: 18.9 % — ABNORMAL HIGH (ref 11.5–15.5)
WBC: 7.1 10*3/uL (ref 4.0–10.5)
nRBC: 0 % (ref 0.0–0.2)

## 2021-10-30 LAB — BASIC METABOLIC PANEL
Anion gap: 8 (ref 5–15)
Anion gap: 9 (ref 5–15)
BUN: 18 mg/dL (ref 8–23)
BUN: 17 mg/dL (ref 8–23)
CO2: 28 mmol/L (ref 22–32)
CO2: 26 mmol/L (ref 22–32)
Calcium: 8.2 mg/dL — ABNORMAL LOW (ref 8.9–10.3)
Calcium: 8.2 mg/dL — ABNORMAL LOW (ref 8.9–10.3)
Chloride: 100 mmol/L (ref 98–111)
Chloride: 99 mmol/L (ref 98–111)
Creatinine, Ser: 1.45 mg/dL — ABNORMAL HIGH (ref 0.44–1.00)
Creatinine, Ser: 1.51 mg/dL — ABNORMAL HIGH (ref 0.44–1.00)
GFR, Estimated: 40 mL/min — ABNORMAL LOW (ref >60)
GFR, Estimated: 38 mL/min — ABNORMAL LOW (ref >60)
Glucose, Bld: 92 mg/dL (ref 70–99)
Glucose, Bld: 132 mg/dL — ABNORMAL HIGH (ref 70–99)
Potassium: 2.4 mmol/L — CL (ref 3.5–5.1)
Potassium: 3.8 mmol/L (ref 3.5–5.1)
Sodium: 136 mmol/L (ref 135–145)
Sodium: 134 mmol/L — ABNORMAL LOW (ref 135–145)

## 2021-10-30 LAB — PLATELET FUNCTION ASSAY: Collagen / Epinephrine: 75 seconds (ref 0–193)

## 2021-10-30 LAB — MAGNESIUM: Magnesium: 1.7 mg/dL (ref 1.7–2.4)

## 2021-10-30 LAB — PROTIME-INR
INR: 1.2 (ref 0.8–1.2)
Prothrombin Time: 15 seconds (ref 11.4–15.2)

## 2021-10-30 MED ORDER — POTASSIUM CHLORIDE CRYS ER 20 MEQ PO TBCR
40.0000 meq | EXTENDED_RELEASE_TABLET | Freq: Once | ORAL | Status: AC
  Administered 2021-10-30: 40 meq via ORAL
  Filled 2021-10-30: qty 2

## 2021-10-30 MED ORDER — METOCLOPRAMIDE HCL 5 MG/ML IJ SOLN
10.0000 mg | Freq: Once | INTRAMUSCULAR | Status: AC
  Administered 2021-10-30: 10 mg via INTRAVENOUS
  Filled 2021-10-30: qty 2

## 2021-10-30 MED ORDER — PEG-KCL-NACL-NASULF-NA ASC-C 100 G PO SOLR
1.0000 | Freq: Once | ORAL | Status: DC
  Filled 2021-10-30: qty 1

## 2021-10-30 MED ORDER — PEG-KCL-NACL-NASULF-NA ASC-C 100 G PO SOLR
0.5000 | Freq: Once | ORAL | Status: AC
  Administered 2021-10-30: 100 g via ORAL

## 2021-10-30 MED ORDER — PEG-KCL-NACL-NASULF-NA ASC-C 100 G PO SOLR: 1.0000 | Freq: Once | ORAL | Status: DC

## 2021-10-30 MED ORDER — POTASSIUM CHLORIDE 10 MEQ/50ML IV SOLN
10.0000 meq | INTRAVENOUS | Status: AC
  Administered 2021-10-30 (×4): 10 meq via INTRAVENOUS
  Filled 2021-10-30 (×4): qty 50

## 2021-10-30 MED ORDER — METOCLOPRAMIDE HCL 5 MG/ML IJ SOLN: 10.0000 mg | Freq: Four times a day (QID) | INTRAMUSCULAR | Status: DC

## 2021-10-30 MED ORDER — PEG-KCL-NACL-NASULF-NA ASC-C 100 G PO SOLR
0.5000 | Freq: Once | ORAL | Status: AC
  Administered 2021-10-30: 100 g via ORAL
  Filled 2021-10-30: qty 1

## 2021-10-30 MED ORDER — BISACODYL 5 MG PO TBEC
10.0000 mg | DELAYED_RELEASE_TABLET | Freq: Four times a day (QID) | ORAL | Status: AC
  Administered 2021-10-30 (×2): 10 mg via ORAL
  Filled 2021-10-30 (×2): qty 2

## 2021-10-30 MED ORDER — MAGNESIUM SULFATE 2 GM/50ML IV SOLN
2.0000 g | Freq: Once | INTRAVENOUS | Status: AC
  Administered 2021-10-30: 2 g via INTRAVENOUS
  Filled 2021-10-30: qty 50

## 2021-10-30 MED FILL — Midazolam HCl Inj 2 MG/2ML (Base Equivalent): INTRAMUSCULAR | Qty: 2 | Status: AC

## 2021-10-30 NOTE — Progress Notes (Addendum)
Progress Note  Patient Name: [name] Date of Encounter: 10/30/2021  [facility] Cardiologist: None    Subjective   RHC/LHC cor angio yesterday demonstrating severely reduced PAPI (in setting of severe TR), PA saturation of 48%, no obstructive coronary artery disease.  She had no evidence of constriction on the study.  No acute events overnight she feels relatively well.  She diuresed well on torsemide.  No recurrent lower GI bleeding.  CVP 19mmHG   Inpatient Medications    Scheduled Meds:  allopurinol  100 mg Oral Q1500   amiodarone  200 mg Oral Daily   atorvastatin  10 mg Oral q AM   buPROPion  150 mg Oral BID   Chlorhexidine Gluconate Cloth  6 each Topical Daily   cholecalciferol  5,000 Units Oral q AM   empagliflozin  10 mg Oral Daily   multivitamin with minerals  1 tablet Oral Daily   pantoprazole  40 mg Oral Daily   potassium chloride SA  40 mEq Oral Daily   torsemide  40 mg Oral BID   venlafaxine XR  150 mg Oral Q breakfast   zolpidem  5 mg Oral QHS   Continuous Infusions:  norepinephrine (LEVOPHED) Adult infusion Stopped (10/18/2021 1834)   potassium chloride 10 mEq (10/30/21 0607)   PRN Meds: acetaminophen, lip balm, nitroGLYCERIN, ondansetron (ZOFRAN) IV   Vital Signs    Vitals:   10/30/21 0500 10/30/21 0600 10/30/21 0603 10/30/21 0700  BP: 111/60 (!) 148/127 127/63 (!) 91/57  Pulse: (!) 108 (!) 111 (!) 116 (!) 106  Resp: 19 (!) 22 18 17   Temp:      TempSrc:      SpO2: 100% 99% 99% 100%  Weight:  87.3 kg    Height:        Intake/Output Summary (Last 24 hours) at 10/30/2021 0716 Last data filed at 10/30/2021 0648 Gross per 24 hour  Intake 521.81 ml  Output 1900 ml  Net -1378.19 ml    Last 3 Weights 10/30/2021 10/09/2021 10/08/2021  Weight (lbs) 192 lb 7.4 oz 197 lb 5 oz 190 lb 4.1 oz  Weight (kg) 87.3 kg 89.5 kg 86.3 kg      Telemetry    Paced, SVT, AF RVR - Personally Reviewed  ECG    None today  Physical Exam   GEN:  No acute distress.   Neck: RIJ line Cardiac: RRR, 3/6 holosystolic murmur.  Respiratory: Clear to auscultation bilaterally. GI: Soft, nontender, non-distended  MS: No edema; No deformity. Neuro:  Nonfocal  Psych: Normal affect   Labs    High Sensitivity Troponin:   Recent Labs  Lab 10/28/21 0000  TROPONINIHS 11      Chemistry Recent Labs  Lab 10/13/2021 2138 10/28/21 0821 11/01/2021 0345 10/19/2021 1240 11/01/2021 1250 10/24/2021 1251 10/30/21 0400  NA 131* 133* 134*   < > 139 138 136  K 3.1* 3.3* 3.8   < > 3.7 3.8 2.4*  CL 94* 99 103  --   --   --  100  CO2 24 24 22   --   --   --  28  GLUCOSE 127* 174* 136*  --   --   --  92  BUN 47* 38* 24*  --   --   --  18  CREATININE 2.78* 2.15* 1.42*  --   --   --  1.45*  CALCIUM 8.4* 8.3* 8.5*  --   --   --  8.2*  PROT 5.9*  --   --   --   --   --   --  ALBUMIN 2.6*  --   --   --   --   --   --   AST 33  --   --   --   --   --   --   ALT 25  --   --   --   --   --   --   ALKPHOS 111  --   --   --   --   --   --   BILITOT 0.9  --   --   --   --   --   --   GFRNONAA 18* 25* 41*  --   --   --  40*  ANIONGAP 13 10 9   --   --   --  8   < > = values in this interval not displayed.     Lipids  Recent Labs  Lab 10/28/21 0821  CHOL 130  TRIG 153*  HDL 27*  LDLCALC 72  CHOLHDL 4.8     Hematology Recent Labs  Lab 10/28/21 0821 11/01/2021 0345 11/03/2021 1240 10/19/2021 1250 10/17/2021 1251 10/30/21 0400  WBC 16.7* 12.2*  --   --   --  7.1  RBC 3.39* 3.00*  --   --   --  2.79*  HGB 9.6* 8.6*   < > 9.5* 9.9* 8.0*  HCT 31.9* 28.8*   < > 28.0* 29.0* 26.9*  MCV 94.1 96.0  --   --   --  96.4  MCH 28.3 28.7  --   --   --  28.7  MCHC 30.1 29.9*  --   --   --  29.7*  RDW 18.1* 19.2*  --   --   --  18.9*  PLT 530* 435*  --   --   --  329   < > = values in this interval not displayed.    Thyroid No results for input(s): TSH, FREET4 in the last 168 hours.  BNP Recent Labs  Lab 10/18/2021 2138  BNP 477.9*     DDimer No results for  input(s): DDIMER in the last 168 hours.   Radiology      Cardiac Studies   TTE 10/28/21  1. Left ventricular ejection fraction, by estimation, is 35 to 40%. The  left ventricle has moderately decreased function. The left ventricle  demonstrates global hypokinesis. Left ventricular diastolic parameters are  consistent with Grade II diastolic  dysfunction (pseudonormalization). Elevated left atrial pressure. There is  the interventricular septum is flattened in diastole ('D' shaped left  ventricle), consistent with right ventricular volume overload.   2. Right ventricular systolic function is low normal. The right  ventricular size is severely enlarged. A is visualized in the coronary  sinus. There is normal pulmonary artery systolic pressure.   3. Left atrial size was mildly dilated.   4. Right atrial size was severely dilated.   5. Moderate pericardial effusion. The pericardial effusion is  circumferential. There is no evidence of cardiac tamponade.   6. The mitral valve is myxomatous. Moderate mitral valve regurgitation.   7. Flail posterior tricuspid valve leaflet. The tricuspid valve is  abnormal. Tricuspid valve regurgitation is severe.   8. The aortic valve is tricuspid. Aortic valve regurgitation is not  visualized. Aortic valve sclerosis is present, with no evidence of aortic  valve stenosis.   9. The inferior vena cava is dilated in size with <50% respiratory  variability, suggesting right atrial pressure of 15 mmHg.  RHC/LHC/cor angio 2/22:  Ao =104/55 (76) LV = 107/18 RA = 17 RV = 34/16 PA = 33/21 (25) PCW = 18 Fick cardiac output/index = 4.2/2.2 PVR = 1.65 Ao sat = 99% PA sat = 47%, 48% PAPi = 0.6   No LV-RE interaction on simultaneous LV-RV tracing   Assessment: 1. Normal coronary arteries 2. EF ~40% 3. Equalized right and left-sided pressure with no evidence of LV-RV interaction 4. Severely reduced PAPi 5. SBP 25 points higher with invasive  measurement versus non-invasive. Will leave arterial sheath in overnight to help wean NE  Patient Profile     66 y.o. female with paroxysmal atrial fibrillation status post failed ablation, status post Watchman December 2022, breast cancer status post Adriamycin therapy, nonischemic cardiomyopathy with ejection fraction 25 to 30% (on TEE improved), status post BiV Medtronic ICD, hypertension, obstructive sleep apnea, and chronic kidney disease here with lower GI bleeding and pericardial effusion.  Assessment & Plan    1.  Pericardial effusion.  Sympathetic effusion due to RV failure and tricuspid regurgitation.  No tamponade present on echocardiogram or invasive assessment.  Monitor. 2.  Hypotension: Invasive pressure is much better versus noninvasive cuff.  She has been weaned off norepinephrine.  SVR on NE ~1111 dynes-sec-cm.  Now off norepi.  Likely degree of vasoplegia. 3.  Nonischemic cardiomyopathy.  BP reasonable and patient not in cardiogenic shock.  Creatinine is stable.  Cont Jardiance, tachcyardia given RV failure is permissible and being tolerated.  3.  GI bleed: Appreciate GI consult, holding plavix.  EGD/c-scope Friday.  NPO at MN today.  Watch fluid balance around EGD/cscope 4.  Chronic kidney disease: Cr stable today, cont torsemide, watch Cr, check K/Mg daily; diuresed well yesterday. 5.  Atrial fibrillation status post Watchman: Hold Plavix for now; TEE demonstrated no leaks in January.  Cont amiodarone 6.  RV failure/dysfunction with severe TR: Given severe TR rapid rates or rapid atrial fibrillation is likely beneficial and the patient is tolerating this tachycardia with marginal PA sat 48%.  We will hold on rate controlling agents for now.  Cardiothoracic surgery to evaluate for tricuspid surgical intervention.  No need for inotropes currently.  Monitor Cr, check CMP daily for LFTs.  For questions or updates, please contact [place] Please consult [email or web address] for contact  info under        Signed, Early Osmond, MD  10/30/2021, 7:16 AM    CRITICAL CARE Performed by: Lenna Sciara   Total critical care time: 35 minutes. Critical care time was exclusive of separately billable procedures and treating other patients. Critical care was necessary to treat or prevent imminent or life-threatening deterioration. Critical care was time spent personally by me on the following activities: development of treatment plan with patient and/or surrogate as well as nursing, discussions with consultants, evaluation of patient's response to treatment, examination of patient, obtaining history from patient or surrogate, ordering and performing treatments and interventions, ordering and review of laboratory studies, ordering and review of radiographic studies, pulse oximetry and re-evaluation of patient's condition.

## 2021-10-30 NOTE — Consult Note (Addendum)
[address]            [place] [address]          [phone]       [name] [place] Medical Record #[id number] Date of Birth: 08-05-1956  No ref. provider found Dr. Aldean Baker, Avel Peace, NP  Chief Complaint:   No chief complaint on file. Lower GI bleeding, shortness of breath  History of Present Illness:     Patient examined, images of echocardiogram, coronary angiogram and right heart cath data personally reviewed and discussed with patient.  Very nice 66 year old female with history of atrial fibrillation, nonischemic cardiomyopathy from previous chemotherapy, history of AICD and CRT lead placement, Watchman device placement December 2022 and recent history of moderate pericardial effusion and progressive symptomatic tricuspid regurgitation.  She was admitted while on Plavix for the watchman for GI bleeding, bright red blood per rectum is pending colonoscopy.  The Plavix was stopped 3 days ago.  TEE late January 2023 shows moderate to severe tricuspid regurgitation with at least 1 flail leaflet (posterior).  RV systolic function is mild to moderately reduced.  Coronary arteries have no significant stenosis and there is mild mitral and aortic valve disease.  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  Patient has had no previous chest surgery other than the AICD pacemaker placement in the right infraclavicular pocket.  She has had a left breast surgery and chemotherapy for breast cancer.   Current Activity/ Functional Status: She has limited functional ability due to her heart failure but is able to walk slowly without significant symptoms.   Past Medical History:  Diagnosis Date   AICD (automatic cardioverter/defibrillator) present    Biventricular implantable cardiac  defibrillator)    Medtronic   Breast cancer ([place]) 1998   - left   CHF (congestive heart failure) (HCC)    takes Lasix and Aldactone daily   Chronic kidney disease    Complication of anesthesia    n/v, none at last SURGERY-2022.   GERD (gastroesophageal reflux disease)    takes Omeprazole daily   Gout    takes Allopurinol daily   History of bronchitis 2 yrs ago   History of shingles    Hyperlipidemia    takes Simvastatin daily   Insomnia    takes Ambien nightly   Nonischemic cardiomyopathy ([place])    EF 55% echo 2012   NSVT (nonsustained ventricular tachycardia)    Obesity    OSA (obstructive sleep apnea)    Unable to wear CPAP   Pneumonia 2005   Presence of permanent cardiac pacemaker    Shortness of breath dyspnea    with exertion   sprint Fidelis     Past Surgical History:  Procedure Laterality Date   ABDOMINAL HYSTERECTOMY     APPENDECTOMY  09/07/1976   ATRIAL FIBRILLATION ABLATION N/A 06/30/2021   Procedure: ATRIAL FIBRILLATION ABLATION;  Surgeon: Vickie Epley, MD;  Location: [facility];  Service: Cardiovascular;  Laterality: N/A;   BiV ICD  09/07/2005   Medtronic   BIV ICD GENERTAOR CHANGE OUT N/A 12/16/2011   Procedure: BIV ICD GENERTAOR CHANGE OUT;  Surgeon: [name], MD;  Location: [facility] CATH LAB;  Service: Cardiovascular;  Laterality: N/A;   CHOLECYSTECTOMY  09/07/2017   COLONOSCOPY     cyst removed from wrist Left    early 70's   ICD LEAD REMOVAL Right 10/18/2014   Procedure: ICD LEAD REMOVAL/EXTRACTION/REIMPLANTATION;  Surgeon: Evans Lance, MD;  Location: [facility];  Service: Cardiovascular;  Laterality: Right;   KYPHOPLASTY N/A 06/06/2021   Procedure: KYPHOPLASTY LUMBAR ONE;  Surgeon: Karsten Ro, DO;  Location: [facility];  Service: Neurosurgery;  Laterality: N/A;   LEFT ATRIAL APPENDAGE OCCLUSION N/A 08/21/2021   Procedure: LEFT ATRIAL APPENDAGE OCCLUSION;  Surgeon: Vickie Epley, MD;  Location: [facility];  Service:  Cardiovascular;  Laterality: N/A;   MASTECTOMY Left 09/07/1996   port a cath placed  09/07/1996   port removed  09/07/1996   RIGHT HEART CATH N/A 06/20/2018   Procedure: RIGHT HEART CATH;  Surgeon: Jolaine Artist, MD;  Location: [facility];  Service: Cardiovascular;  Laterality: N/A;   RIGHT/LEFT HEART CATH AND CORONARY ANGIOGRAPHY N/A 11/03/2021   Procedure: RIGHT/LEFT HEART CATH AND CORONARY ANGIOGRAPHY;  Surgeon: Jolaine Artist, MD;  Location: [facility];  Service: Cardiovascular;  Laterality: N/A;   TEE WITHOUT CARDIOVERSION N/A 08/21/2021   Procedure: TRANSESOPHAGEAL ECHOCARDIOGRAM (TEE);  Surgeon: Vickie Epley, MD;  Location: [facility];  Service: Cardiovascular;  Laterality: N/A;   TEE WITHOUT CARDIOVERSION N/A 10/07/2021   Procedure: TRANSESOPHAGEAL ECHOCARDIOGRAM (TEE);  Surgeon: Sanda Klein, MD;  Location: [facility] ENDOSCOPY;  Service: Cardiovascular;  Laterality: N/A;    Social History   Tobacco Use  Smoking Status Never  Smokeless Tobacco Never    Social History   Substance and Sexual Activity  Alcohol Use No    Social History   Socioeconomic History   Marital status: Married    Spouse name: Not on file   Number of children: Not on file   Years of education: Not on file   Highest education level: Not on file  Occupational History   Not on file  Tobacco Use   Smoking status: Never   Smokeless tobacco: Never  Vaping Use   Vaping Use: Never used  Substance and Sexual Activity   Alcohol use: No   Drug use: No   Sexual activity: Yes    Birth control/protection: None  Other Topics Concern   Not on file  Social History Narrative   Not on file   Social Determinants of Health   Financial Resource Strain: Not on file  Food Insecurity: Not on file  Transportation Needs: Not on file  Physical Activity: Not on file  Stress: Not on file  Social Connections: Not on file  Intimate Partner Violence: Not on file    Allergies   Allergen Reactions   Buprenorphine Hcl Shortness Of Breath and Other (See Comments)    Patient required emergent use of naloxone and naloxone drip in ICU for only taking Percocet 5-325 over the course of one day. She appears to have impaired metabolism of opioid medications and providers should use these medications with extreme caution.   Morphine And Related Shortness Of Breath, Rash and Other (See Comments)    Patient required emergent use of naloxone and naloxone drip in ICU for only taking Percocet 5-325 over the course of one day. She appears to have impaired metabolism of opioid medications and providers should use these medications with extreme caution.  Mental status changes, also    Oxycodone Shortness Of Breath and Other (See Comments)    Mental status changes and Patient required emergent  use of naloxone and naloxone drip in ICU for only taking Percocet 5-325 over the course of one day. She appears to have impaired metabolism of opioid medications and providers should use these medications with extreme caution.    Percocet [Oxycodone-Acetaminophen] Shortness Of Breath and Other (See Comments)    Hospitalized-  Patient required emergent use of naloxone and naloxone drip in ICU for only taking Percocet 5-325 over the course of one day. She appears to have impaired metabolism of opioid medications and providers should use these medications with extreme caution.     Current Facility-Administered Medications  Medication Dose Route Frequency Provider Last Rate Last Admin   acetaminophen (TYLENOL) tablet 650 mg  650 mg Oral Q4H PRN Bensimhon, Shaune Pascal, MD       allopurinol (ZYLOPRIM) tablet 100 mg  100 mg Oral Q1500 Bensimhon, Shaune Pascal, MD   100 mg at 10/26/2021 1535   amiodarone (PACERONE) tablet 200 mg  200 mg Oral Daily Bensimhon, Shaune Pascal, MD   200 mg at 10/16/2021 4132   atorvastatin (LIPITOR) tablet 10 mg  10 mg Oral q AM Bensimhon, Shaune Pascal, MD   10 mg at 10/30/21 4401   buPROPion  (WELLBUTRIN SR) 12 hr tablet 150 mg  150 mg Oral BID Bensimhon, Shaune Pascal, MD   150 mg at 10/13/2021 2147   Chlorhexidine Gluconate Cloth 2 % PADS 6 each  6 each Topical Daily Bensimhon, Shaune Pascal, MD   6 each at 10/20/2021 1730   cholecalciferol (VITAMIN D3) tablet 5,000 Units  5,000 Units Oral q AM Bensimhon, Shaune Pascal, MD   5,000 Units at 10/30/21 0272   empagliflozin (JARDIANCE) tablet 10 mg  10 mg Oral Daily Bensimhon, Shaune Pascal, MD   10 mg at 10/22/2021 1535   lip balm (CARMEX) ointment   Topical PRN Bensimhon, Shaune Pascal, MD   75 application at 53/66/44 0347   multivitamin with minerals tablet 1 tablet  1 tablet Oral Daily Bensimhon, Shaune Pascal, MD   1 tablet at 10/16/2021 4259   nitroGLYCERIN (NITROSTAT) SL tablet 0.4 mg  0.4 mg Sublingual Q5 Min x 3 PRN Bensimhon, Shaune Pascal, MD       ondansetron [facility]) injection 4 mg  4 mg Intravenous Q6H PRN Bensimhon, Shaune Pascal, MD       pantoprazole (PROTONIX) EC tablet 40 mg  40 mg Oral Daily Bensimhon, Shaune Pascal, MD   40 mg at 10/23/2021 5638   potassium chloride 10 mEq in 50 mL *CENTRAL LINE* IVPB  10 mEq Intravenous Q1 Hr x 4 Marykay Lex, MD 50 mL/hr at 10/30/21 0856 10 mEq at 10/30/21 0856   potassium chloride SA (KLOR-CON M) CR tablet 40 mEq  40 mEq Oral Daily Bensimhon, Shaune Pascal, MD   40 mEq at 10/30/2021 7564   torsemide (DEMADEX) tablet 40 mg  40 mg Oral BID Bensimhon, Shaune Pascal, MD   40 mg at 10/28/2021 1830   venlafaxine XR (EFFEXOR-XR) 24 hr capsule 150 mg  150 mg Oral Q breakfast Bensimhon, Shaune Pascal, MD   150 mg at 10/14/2021 3329   zolpidem (AMBIEN) tablet 5 mg  5 mg Oral QHS Bensimhon, Shaune Pascal, MD   5 mg at 10/28/2021 2147     Family History  Problem Relation Age of Onset   Diabetes Mother    Heart disease Father    Alzheimer's disease Father      Review of Systems:     Cardiac Review of Systems: Y or N  Chest Pain [    ]  Resting SOB [   ] Exertional SOB  [ y ]  Orthopnea [  y]   Pedal Edema [  y ]    Palpitations [  y] Syncope  [  ]   Presyncope [    ]  General Review of Systems: [Y] = yes [  ]=no Constitional: recent weight change [ y ]; anorexia [ y ]; fatigue [ y ]; nausea [  ]; night sweats [  ]; fever [  ]; or chills [  ];                                                                                                                                          Dental: poor dentition[  ]; Last Dentist visit: recent- ok  Eye : blurred vision [  ]; diplopia [   ]; vision changes [  ];  Amaurosis fugax[  ]; Resp: cough [  ];  wheezing[  ];  hemoptysis[  ]; shortness of breath[  y]; paroxysmal nocturnal dyspnea[  ]; dyspnea on exertion[  y]; or orthopnea[  ];  GI:  gallstones[  ], vomiting[  ];  dysphagia[  ]; melena[  ];  hematochezia [ y ]; heartburn[  ];   Hx of  Colonoscopy[  y]; GU: kidney stones [  ]; hematuria[  ];   dysuria [  ];  nocturia[  ];  history of     obstruction [  ];                 Skin: rash, swelling[  ];, hair loss[  ];  peripheral edema[  ];  or itching[  ]; Musculosketetal: myalgias[  ];  joint swelling[  ];  joint erythema[  ];  joint pain[  ];  back pain[  ];  Heme/Lymph: bruising[  y];  bleeding[ y ];  anemia[  y];  Neuro: TIA[  ];  headaches[  ];  stroke[  ];  vertigo[  ];  seizures[  ];   paresthesias[  ];  difficulty walking[  ];  Psych:depression[  ]; anxiety[  ];  Endocrine: diabetes[  ];  thyroid dysfunction[  ];  Immunizations: Flu [  ]; Pneumococcal[  ];  Other:  Physical Exam: BP (!) 91/57    Pulse (!) 106    Temp 97.6 F (36.4 C) (Oral)    Resp 17    Ht 5\' 2"  (1.575 m)    Wt 87.3 kg    SpO2 100%    BMI 35.20 kg/m       Physical Exam  General: siting in chair after breakfast HEENT: Normocephalic pupils equal , dentition adequate Neck: Supple 2+ JVD, no adenopathy, or bruit. IJ line in place Chest: Clear to auscultation, symmetrical breath sounds, no rhonchi, no tenderness  or deformity Cardiovascular: afib rhythm, 3/6 TR murmur, no gallop, peripheral pulses             palpable in all  extremities Abdomen:  Soft, nontender, no palpable mass or organomegaly Extremities: Warm, well-perfused, no clubbing cyanosis edema or tenderness,              no venous stasis changes of the legs Rectal/GU: Deferred Neuro: Grossly non--focal and symmetrical throughout Skin: Clean and dry without rash or ulceration    Diagnostic Studies & Laboratory data:     Recent Radiology Findings:   CARDIAC CATHETERIZATION  Addendum Date: 10/15/2021     The left ventricular ejection fraction is 35-45% by visual estimate. Findings: On NE at 5 Ao =104/55 (76) LV = 107/18 RA = 17 RV = 34/16 PA = 33/21 (25) PCW = 18 Fick cardiac output/index = 4.2/2.2 PVR = 1.65 Ao sat = 99% PA sat = 47%, 48% PAPi = 0.6 No LV-RE interaction on simultaneous LV-RV tracing Assessment: 1. Normal coronary arteries 2. EF ~40% 3. Equalized right and left-sided pressure with no evidence of LV-RV interaction 4. Severely reduced PAPi 5. SBP 25 points higher with invasive measurement versus non-invasive. Will leave arterial sheath in overnight to help wean NE Plan/Discussion: Suspect main issue is RV failure due to severe TR. Will need surgical evaluation of TV ring. D/w Drs. Thukkani and [employer]. Glori Bickers, MD 7:56 PM   Result Date: 10/30/2021   The left ventricular ejection fraction is 35-45% by visual estimate. Findings: Ao =104/55 (76) LV = 107/18 RA = 17 RV = 34/16 PA = 33/21 (25) PCW = 18 Fick cardiac output/index = 4.2/2.2 PVR = 1.65 Ao sat = 99% PA sat = 47%, 48% PAPi = 0.6 No LV-RE interaction on simultaneous LV-RV tracing Assessment: 1. Normal coronary arteries 2. EF ~40% 3. Equalized right and left-sided pressure with no evidence of LV-RV interaction 4. Severely reduced PAPi Plan/Discussion: Suspect main issue is RV failure due to severe TR. Will need surgical evaluation of TV ring. D/w Drs. Thukkani and [employer]. Glori Bickers, MD 7:56 PM     Recent Lab Findings: Lab Results  Component Value Date   WBC 7.1  10/30/2021   HGB 8.0 (L) 10/30/2021   HCT 26.9 (L) 10/30/2021   PLT 329 10/30/2021   GLUCOSE 92 10/30/2021   CHOL 130 10/28/2021   TRIG 153 (H) 10/28/2021   HDL 27 (L) 10/28/2021   LDLDIRECT 147.2 01/15/2009   LDLCALC 72 10/28/2021   ALT 25 10/16/2021   AST 33 11/01/2021   NA 136 10/30/2021   K 2.4 (LL) 10/30/2021   CL 100 10/30/2021   CREATININE 1.45 (H) 10/30/2021   BUN 18 10/30/2021   CO2 28 10/30/2021   TSH 5.600 (H) 04/03/2021   INR 1.2 10/30/2021   HGBA1C 5.8 (H) 05/07/2021      Assessment / Plan:   Severe tricuspid regurgitation due to flail leaflet(s) with significant RV dysfunction and significantly reduced Papi on hemodynamics from right heart cath  Patient would benefit from tricuspid valve repair-replacement with possible Maze procedure after GI bleeding is evaluated and treated.  Continue to hold Plavix.  We will plan on surgery early next week -Tuesday a.m.  Plan of care discussed with patient and husband.

## 2021-10-30 NOTE — Progress Notes (Signed)
Electrophysiology Rounding Note  Patient Name: [name] Date of Encounter: 10/30/2021  Primary Cardiologist: None Electrophysiologist: Virl Axe, MD   Subjective   The patient is doing well today.  At this time, the patient denies chest pain, shortness of breath, or any new concerns.  Inpatient Medications    Scheduled Meds:  allopurinol  100 mg Oral Q1500   amiodarone  200 mg Oral Daily   atorvastatin  10 mg Oral q AM   bisacodyl  10 mg Oral Q6H   buPROPion  150 mg Oral BID   Chlorhexidine Gluconate Cloth  6 each Topical Daily   cholecalciferol  5,000 Units Oral q AM   empagliflozin  10 mg Oral Daily   metoCLOPramide (REGLAN) injection  10 mg Intravenous Q6H   multivitamin with minerals  1 tablet Oral Daily   pantoprazole  40 mg Oral Daily   peg 3350 powder  1 kit Oral Once   potassium chloride SA  40 mEq Oral Daily   torsemide  40 mg Oral BID   venlafaxine XR  150 mg Oral Q breakfast   zolpidem  5 mg Oral QHS   Continuous Infusions:  magnesium sulfate bolus IVPB     PRN Meds: acetaminophen, lip balm, nitroGLYCERIN, ondansetron (ZOFRAN) IV   Vital Signs    Vitals:   10/30/21 0920 10/30/21 1013 10/30/21 1020 10/30/21 1050  BP: (!) 113/49 (!) 87/60 (!) 89/52 98/69  Pulse: 96 (!) 102 (!) 108 (!) 109  Resp: (!) 22 (!) 22 17 (!) 23  Temp:      TempSrc:      SpO2: 100% 99% (!) 85% 99%  Weight:      Height:        Intake/Output Summary (Last 24 hours) at 10/30/2021 1202 Last data filed at 10/30/2021 1000 Gross per 24 hour  Intake 377.24 ml  Output 1650 ml  Net -1272.76 ml   Filed Weights   10/09/2021 2124 11/01/2021 0600 10/30/21 0600  Weight: 86.3 kg 89.5 kg 87.3 kg    Physical Exam    GEN- The patient is well appearing, alert and oriented x 3 today.   Head- normocephalic, atraumatic Eyes-  Sclera clear, conjunctiva pink Ears- hearing intact Oropharynx- clear Neck- supple Lungs- Clear to ausculation bilaterally, normal work of  breathing Heart- Regular rate and rhythm, no murmurs, rubs or gallops GI- soft, NT, ND, + BS Extremities- no clubbing or cyanosis. No edema Skin- no rash or lesion Psych- euthymic mood, full affect Neuro- strength and sensation are intact  Labs    CBC Recent Labs    11/01/2021 0345 10/09/2021 1240 10/20/2021 1251 10/30/21 0400  WBC 12.2*  --   --  7.1  HGB 8.6*   < > 9.9* 8.0*  HCT 28.8*   < > 29.0* 26.9*  MCV 96.0  --   --  96.4  PLT 435*  --   --  329   < > = values in this interval not displayed.   Basic Metabolic Panel Recent Labs    10/30/21 0400 10/30/21 1010  NA 136 134*  K 2.4* 3.8  CL 100 99  CO2 28 26  GLUCOSE 92 132*  BUN 18 17  CREATININE 1.45* 1.51*  CALCIUM 8.2* 8.2*  MG 1.7  --    Liver Function Tests Recent Labs    10/15/2021 2138  AST 33  ALT 25  ALKPHOS 111  BILITOT 0.9  PROT 5.9*  ALBUMIN 2.6*   No results  for input(s): LIPASE, AMYLASE in the last 72 hours. Cardiac Enzymes No results for input(s): CKTOTAL, CKMB, CKMBINDEX, TROPONINI in the last 72 hours.   Telemetry    NSR/Sinus tach 90-120s (personally reviewed)  Radiology    CARDIAC CATHETERIZATION  Addendum Date: 10/14/2021     The left ventricular ejection fraction is 35-45% by visual estimate. Findings: On NE at 5 Ao =104/55 (76) LV = 107/18 RA = 17 RV = 34/16 PA = 33/21 (25) PCW = 18 Fick cardiac output/index = 4.2/2.2 PVR = 1.65 Ao sat = 99% PA sat = 47%, 48% PAPi = 0.6 No LV-RE interaction on simultaneous LV-RV tracing Assessment: 1. Normal coronary arteries 2. EF ~40% 3. Equalized right and left-sided pressure with no evidence of LV-RV interaction 4. Severely reduced PAPi 5. SBP 25 points higher with invasive measurement versus non-invasive. Will leave arterial sheath in overnight to help wean NE Plan/Discussion: Suspect main issue is RV failure due to severe TR. Will need surgical evaluation of TV ring. D/w Drs. Thukkani and [employer]. Glori Bickers, MD 7:56 PM   Result Date:  10/10/2021   The left ventricular ejection fraction is 35-45% by visual estimate. Findings: Ao =104/55 (76) LV = 107/18 RA = 17 RV = 34/16 PA = 33/21 (25) PCW = 18 Fick cardiac output/index = 4.2/2.2 PVR = 1.65 Ao sat = 99% PA sat = 47%, 48% PAPi = 0.6 No LV-RE interaction on simultaneous LV-RV tracing Assessment: 1. Normal coronary arteries 2. EF ~40% 3. Equalized right and left-sided pressure with no evidence of LV-RV interaction 4. Severely reduced PAPi Plan/Discussion: Suspect main issue is RV failure due to severe TR. Will need surgical evaluation of TV ring. D/w Drs. Thukkani and [employer]. Glori Bickers, MD 7:56 PM   Patient Profile     [name] is a 66 y.o. female with a history of paroxymal atrial fibrillation with recent failed ablation S/p Watchman implant 08/21/21,  breast CA in 1998 s/p left breast surgery and s/p Adriamycin x 4 cycles, non-ischemic cardiomyopathy/chronic systolic CHF with prior EF now at 45-50% (TEE 10/07/21), s/p BiV Medtronic ICD, HTN, OSA, and CKD Stage IIIb who is being seen for the evaluation of pericardial effusion at the request of Dr. Haroldine Laws.  Assessment & Plan    Pericardial effusion: Unlikely from Watchman Bedside echo 10/25/2021 with suspicion for tamponade.  Effusion moderate on Echo 10/28/21 RHC 2/22 with equalization of right and left-sided pressures with no evidence of LV-RV interaction Suspect main issue is severe TR with markedly elevated RA pressures and inability to drain pericardium.   2. Chronic systolic CHF: Felt to have LBBB CM vs chemo induced (got adriamycin). 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 No effusion Appreciate CHF team care.   3. Severe TR With RV failure Dr. Darcey Nora has seen and recommends repair. Planning for next  week.    4. PAF Unable to complete ablation due to proximity of small pericardial effusion to RA/RIPV. S/p Watchman implant 08/21/21 Continue amiodarone 200 mg daily   5. AKI on CKD IIIb/IV Cr 1.51 today In setting of GI bleed and hypotension Follow closely.    6. GI bleeding BRBPR + melena 4-5 days prior to admission Per her report CT at OSH with diverticulosis but no diverticulitis Denies NSAID use 1 u  PRBCs overnight for Hgb 8.8, recheck CBC today GI consulted Holding plavix  7. Hypokalemia K 2.4 overnight, supped and recheck 3.8.   For questions or updates, please contact [facility] Please consult [email or web address] for contact info under Cardiology/STEMI.  Signed, Shirley Friar, PA-C  10/30/2021, 12:02 PM

## 2021-10-30 NOTE — Progress Notes (Addendum)
Phone [phone]                                                              Daily Rounding Note  10/30/2021, 1:18 PM  LOS: 3 days    SUBJECTIVE:   Chief complaint:    Hematochezia.  Chronic Plavix on hold.  Severe tricuspid regurgitation.  Patient's blood pressures remain soft.  Currently in the low 100s/60 but readings as low as 91/46 w heart rate 116 this morning.  Hypokalemia at 2.4 this morning. Got oral and IV potassium Stool yesterday PM was dark.   Pt feeling well.  No signif dyspnea.  No abd pain.   No dizziness  OBJECTIVE:         Vital signs in last 24 hours:    Temp:  [97.6 F (36.4 C)-98 F (36.7 C)] 97.6 F (36.4 C) (02/23 0755) Pulse Rate:  [67-116] 109 (02/23 0915) Resp:  [14-29] 22 (02/23 0915) BP: (74-148)/(31-127) 104/59 (02/23 0915) SpO2:  [80 %-100 %] 100 % (02/23 0915) Arterial Line BP: (84-174)/(43-100) 84/68 (02/22 2000) Weight:  [87.3 kg] 87.3 kg (02/23 0600) Last BM Date : 10/30/2021 Filed Weights   10/08/2021 2124 10/28/2021 0600 10/30/21 0600  Weight: 86.3 kg 89.5 kg 87.3 kg   General: Looks better than history would suggest.  Not acutely ill appearing, comfortable and in no distress. Heart: RRR.  2/6 systolic murmur. Chest: No labored breathing or cough.  Lungs clear bilaterally. Abdomen: No tenderness or distention.  Active bowel sounds. Extremities: No CCE. Neuro/Psych: Alert, pleasant.  Fully oriented.  Fluid speech.  No gross deficits.  No tremors  Intake/Output from previous day: 02/22 0701 - 02/23 0700 In: 521.8 [P.O.:180; I.V.:341.8] Out: 1900 [Urine:1900]  Intake/Output this shift: No intake/output data recorded.  Lab Results: Recent Labs    10/28/21 0821 10/30/2021 0345 10/08/2021 1240 10/23/2021 1250 10/16/2021 1251 10/30/21 0400  WBC 16.7* 12.2*  --   --   --  7.1  HGB 9.6* 8.6*   < > 9.5* 9.9* 8.0*  HCT 31.9* 28.8*   < > 28.0* 29.0* 26.9*  PLT 530* 435*  --   --   --  329   < > = values in this interval not displayed.    BMET Recent Labs    10/28/21 0821 10/24/2021 0345 10/23/2021 1240 10/15/2021 1250 10/21/2021 1251 10/30/21 0400  NA 133* 134*   < > 139 138 136  K 3.3* 3.8   < > 3.7 3.8 2.4*  CL 99 103  --   --   --  100  CO2 24 22  --   --   --  28  GLUCOSE 174* 136*  --   --   --  92  BUN 38* 24*  --   --   --  18  CREATININE 2.15* 1.42*  --   --   --  1.45*  CALCIUM 8.3* 8.5*  --   --   --  8.2*   < > = values in this interval not displayed.   LFT Recent Labs    10/14/2021 2138  PROT 5.9*  ALBUMIN 2.6*  AST 33  ALT 25  ALKPHOS 111  BILITOT 0.9   PT/INR Recent Labs    10/30/21 0400  LABPROT  15.0  INR 1.2   Hepatitis Panel No results for input(s): HEPBSAG, HCVAB, HEPAIGM, HEPBIGM in the last 72 hours.  Studies/Results: DG CHEST PORT 1 VIEW  Result Date: 10/28/2021 CLINICAL DATA:  Central line placement. EXAM: PORTABLE CHEST 1 VIEW COMPARISON:  PA Lat 08/21/2021. FINDINGS: Right IJ central line is in position with tip in the right atrium. There is no pneumothorax. Right chest dual lead pacing system with AID wiring and wire insertions are similar. The heart again noted enlarged but the central vessels are normal caliber. Since the prior study a left atrial appendage occlusion device has been inserted. The lungs are generally clear. No significant pleural effusion is evident. Overlying metallic structures noted on the right inferiorly. Multiple overlying monitor leads. IMPRESSION: 1. Right IJ central line tip in the right atrium. 2. Cardiomegaly, with normal caliber central vessels. 3. Interval insertion of a left atrial appendage occlusion device. Electronically Signed   By: Telford Nab M.D.   On: 10/28/2021 01:24   ECHOCARDIOGRAM COMPLETE  Result Date: 10/28/2021 IMPRESSIONS  1. Left ventricular ejection fraction, by estimation, is 35 to 40%. The left ventricle has moderately decreased function. The left ventricle demonstrates global hypokinesis. Left ventricular diastolic parameters are  consistent with Grade II diastolic dysfunction (pseudonormalization). Elevated left atrial pressure. There is the interventricular septum is flattened in diastole ('D' shaped left ventricle), consistent with right ventricular volume overload.  2. Right ventricular systolic function is low normal. The right ventricular size is severely enlarged. A is visualized in the coronary sinus. There is normal pulmonary artery systolic pressure.  3. Left atrial size was mildly dilated.  4. Right atrial size was severely dilated.  5. Moderate pericardial effusion. The pericardial effusion is circumferential. There is no evidence of cardiac tamponade.  6. The mitral valve is myxomatous. Moderate mitral valve regurgitation.  7. Flail posterior tricuspid valve leaflet. The tricuspid valve is abnormal. Tricuspid valve regurgitation is severe.  8. The aortic valve is tricuspid. Aortic valve regurgitation is not visualized. Aortic valve sclerosis is present, with no evidence of aortic valve stenosis.  9. The inferior vena cava is dilated in size with <50% respiratory variability, suggesting right atrial pressure of 15 mmHg. 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 This may allow equalization of atrial pressures and mask respiratory flow variation as a sign of tamponade. Right heart volume overload may reduce the occurence of right heart chamber collapse. Serial studies are recommended.  Dani Gobble Croitoru MD Electronically signed by Sanda Klein MD Signature Date/Time: 10/28/2021/10:10:43 AM    Final     Scheduled Meds:  allopurinol  100 mg Oral Q1500   amiodarone  200 mg Oral Daily   atorvastatin  10 mg Oral q AM   buPROPion  150 mg Oral BID   Chlorhexidine Gluconate Cloth  6 each Topical Daily   cholecalciferol  5,000 Units Oral q AM   empagliflozin  10 mg Oral Daily    multivitamin with minerals  1 tablet Oral Daily   pantoprazole  40 mg Oral Daily   potassium chloride SA  40 mEq Oral Daily   torsemide  40 mg Oral BID   venlafaxine XR  150 mg Oral Q breakfast   zolpidem  5 mg Oral QHS   Continuous Infusions:  potassium chloride 10 mEq (10/30/21 0856)   PRN Meds:.acetaminophen, lip balm, nitroGLYCERIN, ondansetron (ZOFRAN) IV   ASSESMENT:  Painless hematochezia.    Lupus anemia.  Hgb decline: 9.9 ... 8 in last 24 hours.      Asymptomatic GERD, continues on chronic Protonix 40 mg/daily.    Chronic Plavix.  On hold.    Pericardial effusion.  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  ICD in situ.  PAF.  Watchman procedure for PAF 08/2021.  Severe tricuspid regurgitation.  R heart cardiac cath 10/26/2021: Severe tricuspid regurgitation with flail leaflet, significant RV dysfunction.  Dr. Darcey Nora feels pt would benefit from tricuspid valve repair-replacement, possible maze procedure after GI bleeding evaluated and treated.  Patient's Plavix is on hold.     AKI, creatinine/BUN overall improved.  Hypokalemia.    Hx breast cancer.     PLAN     Plan EGD, colonoscopy on 2/24, Friday.  Timing TBD but probably midmorning.  See orders for Dulcolax, movie prep, Reglan.  Switched diet to clear liquids.    Azucena Freed  10/30/2021, 1:19 PM Phone [phone]     Attending physician's note   I have taken history, reviewed the chart and examined the patient. I performed a substantive portion of this encounter, including complete performance of at least one of the key components, in conjunction with the APP. I agree with the Advanced Practitioner's note, impression and recommendations.   GI bleed- upper vs lower. Plavix on hold. Last dose 2/19. Neg CT @ [facility] except for div. No active bleeding. Hb 8.  Pericardial effusion (?etiology) Has severe TR. Likely require TV repair/replacement after GI WU (Dr Darcey Nora). No tamponade.  PAF, nonischemic  cardiomyopathy (EF 25-30%) s/p AICD, CKD, OSA, HTN  Plan: -Proceed with EGD/colon in a.m. -Trend CBC.  -Continue Protonix. -Appreciate cardiology's assistance to standby -D/W pt and pt's husband.    I discussed EGD/Colonoscopy- the indications, risks, alternatives and potential complications including, but not limited to bleeding, infection, reaction to meds, damage to internal organs, cardiac and/or pulmonary problems, and perforation requiring surgery. The possibility that significant findings could be missed was explained. All ?were answered. Pt consents to proceed.   Carmell Austria, MD [facility] [phone]

## 2021-10-30 NOTE — Progress Notes (Addendum)
Advanced Heart Failure Rounding Note  PCP-Cardiologist: None   Subjective:   02/20: Transferred to [facility] for GI bleed + pericardial effusion 02/22: R/LHC with normal cors, equalized R and L sided pressures with no evidence of LV-RV interaction, RA 17, PA 33/21 (25), Fick CO/CI 4.2/2.2, severely reduced PAPi (in setting of severe TR), PA sat 47%, 48%  NE now off  Scr stable at 1.45. K significantly reduced at 2.4.  CVP 10-11  1900 cc UOP yesterday. Weight down 5 lb overnight.  Feeling well. No dyspnea. No recurrent bleeding.   Objective:   Weight Range: 87.3 kg Body mass index is 35.2 kg/m.   Vital Signs:   Temp:  [97.7 F (36.5 C)-98 F (36.7 C)] 98 F (36.7 C) (02/23 0300) Pulse Rate:  [67-116] 106 (02/23 0700) Resp:  [13-29] 17 (02/23 0700) BP: (74-148)/(31-127) 91/57 (02/23 0700) SpO2:  [80 %-100 %] 100 % (02/23 0700) Arterial Line BP: (84-174)/(43-100) 84/68 (02/22 2000) Weight:  [87.3 kg] 87.3 kg (02/23 0600) Last BM Date : 10/22/2021  Weight change: Filed Weights   10/08/2021 2124 10/19/2021 0600 10/30/21 0600  Weight: 86.3 kg 89.5 kg 87.3 kg    Intake/Output:   Intake/Output Summary (Last 24 hours) at 10/30/2021 0726 Last data filed at 10/30/2021 0648 Gross per 24 hour  Intake 521.81 ml  Output 1900 ml  Net -1378.19 ml      Physical Exam  CVP 11 General:  Well appearing. No resp difficulty HEENT: normal Neck: supple. JVP 10-12. + RIJ.  Cor: PMI nondisplaced. Regular rhythm, tachy. No rubs, gallops, 3/6 TR murmur Lungs: clear Abdomen: soft, nontender, nondistended. No hepatosplenomegaly. Extremities: no cyanosis, clubbing, rash, edema Neuro: alert & orientedx3, cranial nerves grossly intact. Affect pleasant     Telemetry   Sinus tach 110s-120   Labs    CBC Recent Labs    10/30/2021 0345 10/17/2021 1240 10/22/2021 1251 10/30/21 0400  WBC 12.2*  --   --  7.1  HGB 8.6*   < > 9.9* 8.0*  HCT 28.8*   < > 29.0* 26.9*  MCV 96.0  --   --  96.4   PLT 435*  --   --  329   < > = values in this interval not displayed.   Basic Metabolic Panel Recent Labs    11/01/2021 0345 10/23/2021 1240 10/26/2021 1251 10/30/21 0400  NA 134*   < > 138 136  K 3.8   < > 3.8 2.4*  CL 103  --   --  100  CO2 22  --   --  28  GLUCOSE 136*  --   --  92  BUN 24*  --   --  18  CREATININE 1.42*  --   --  1.45*  CALCIUM 8.5*  --   --  8.2*   < > = values in this interval not displayed.   Liver Function Tests Recent Labs    10/30/2021 2138  AST 33  ALT 25  ALKPHOS 111  BILITOT 0.9  PROT 5.9*  ALBUMIN 2.6*   No results for input(s): LIPASE, AMYLASE in the last 72 hours. Cardiac Enzymes No results for input(s): CKTOTAL, CKMB, CKMBINDEX, TROPONINI in the last 72 hours.  BNP: BNP (last 3 results) Recent Labs    02/24/21 1357 07/16/21 1145 10/12/2021 2138  BNP 326.4* 523.7* 477.9*    ProBNP (last 3 results) No results for input(s): PROBNP in the last 8760 hours.   D-Dimer No results for input(s):  DDIMER in the last 72 hours. Hemoglobin A1C No results for input(s): HGBA1C in the last 72 hours. Fasting Lipid Panel Recent Labs    10/28/21 0821  CHOL 130  HDL 27*  LDLCALC 72  TRIG 153*  CHOLHDL 4.8   Thyroid Function Tests No results for input(s): TSH, T4TOTAL, T3FREE, THYROIDAB in the last 72 hours.  Invalid input(s): FREET3  Other results:   Imaging    CARDIAC CATHETERIZATION  Addendum Date: 10/18/2021     The left ventricular ejection fraction is 35-45% by visual estimate. Findings: On NE at 5 Ao =104/55 (76) LV = 107/18 RA = 17 RV = 34/16 PA = 33/21 (25) PCW = 18 Fick cardiac output/index = 4.2/2.2 PVR = 1.65 Ao sat = 99% PA sat = 47%, 48% PAPi = 0.6 No LV-RE interaction on simultaneous LV-RV tracing Assessment: 1. Normal coronary arteries 2. EF ~40% 3. Equalized right and left-sided pressure with no evidence of LV-RV interaction 4. Severely reduced PAPi 5. SBP 25 points higher with invasive measurement versus non-invasive.  Will leave arterial sheath in overnight to help wean NE Plan/Discussion: Suspect main issue is RV failure due to severe TR. Will need surgical evaluation of TV ring. D/w Drs. Thukkani and [employer]. Glori Bickers, MD 7:56 PM   Result Date: 10/19/2021   The left ventricular ejection fraction is 35-45% by visual estimate. Findings: Ao =104/55 (76) LV = 107/18 RA = 17 RV = 34/16 PA = 33/21 (25) PCW = 18 Fick cardiac output/index = 4.2/2.2 PVR = 1.65 Ao sat = 99% PA sat = 47%, 48% PAPi = 0.6 No LV-RE interaction on simultaneous LV-RV tracing Assessment: 1. Normal coronary arteries 2. EF ~40% 3. Equalized right and left-sided pressure with no evidence of LV-RV interaction 4. Severely reduced PAPi Plan/Discussion: Suspect main issue is RV failure due to severe TR. Will need surgical evaluation of TV ring. D/w Drs. Thukkani and [employer]. Glori Bickers, MD 7:56 PM    Medications:     Scheduled Medications:  allopurinol  100 mg Oral Q1500   amiodarone  200 mg Oral Daily   atorvastatin  10 mg Oral q AM   buPROPion  150 mg Oral BID   Chlorhexidine Gluconate Cloth  6 each Topical Daily   cholecalciferol  5,000 Units Oral q AM   empagliflozin  10 mg Oral Daily   multivitamin with minerals  1 tablet Oral Daily   pantoprazole  40 mg Oral Daily   potassium chloride SA  40 mEq Oral Daily   torsemide  40 mg Oral BID   venlafaxine XR  150 mg Oral Q breakfast   zolpidem  5 mg Oral QHS    Infusions:  norepinephrine (LEVOPHED) Adult infusion Stopped (10/28/2021 1834)   potassium chloride 10 mEq (10/30/21 0607)    PRN Medications: acetaminophen, lip balm, nitroGLYCERIN, ondansetron (ZOFRAN) IV   Assessment/Plan   Pericardial effusion: -Doubt from Watchman procedure in 12/22 -Bedside echo 02/20 with suspicion for tamponade.  -Effusion appears moderate in size on echo 02/21.  -Joaquin 02/22 with equalization of right and left-sided pressures with no evidence of LV-RV interaction -There does not  appear to be any evidence of tamponade. Suspect main issue is severe TR with markedly elevated RA pressures and inability to drain pericardium.  2. Chronic systolic CHF: - felt to have LBBB CM vs chemo induced (got adriamycin). 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 No effusion -Echo 02/21: EF 35-40%, RV severely enlarged with okay function, moderate MR, severe TR with flail posterior leaflet -RHC 02/22: RA 17, PA 33/21 (25), PCWP 18, PAPi 0.6 (in setting of severe TR), PA sat 47%, 48% - Marginal PA sat on RHC. Monitor for signs of low-output - CVP 10-11. Diuresed well with po Torsemide 40 mg BID. Continue. - Home spiro and bisoprolol held d/t hypotension - Continue jardiance 10 mg daily  3. Severe TR - with RV failure - TCTS to see for consideration of surgical repair  4. PAF/tachycardia - Unable to complete ablation 10/22 d/t pericardial effusion - S/p Watchman implant 08/21/21 - On amiodarone 200 mg daily - Sinus tachycardia today. Would not add beta blocker or other rate rate controlling medications in setting of RV failure  5. 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 GI bleeding - BRBPR + melena 4-5 days prior to admission - Per her report CT at OSH with diverticulosis but no diverticulitis - Denies NSAID use - 1 u PRBCs this admit - Hgb 8 today - GI has seen. Planning for EGD/colon on 02/24 - Holding plavix  6. OSA - Unable to tolerate CPAP  7. Hypokalemia - K 2.4. Supp. - Check magnesium   Length of Stay: 3  FINCH, LINDSAY N, PA-C  10/30/2021, 7:26 AM  Advanced Heart Failure Team Pager [phone] (M-F; 7a - 5p)  Please contact [facility] for  night-coverage after hours (5p -7a ) and weekends on [email or web address]   Patient seen and examined with the above-signed Advanced Practice Provider and/or Housestaff. I personally reviewed laboratory data, imaging studies and relevant notes. I independently examined the patient and formulated the important aspects of the plan. I have edited the note to reflect any of my changes or salient points. I have personally discussed the plan with the patient and/or family.  Results of cath reviewed with her and her husband.   Breathing better. No orthopnea or PND. No further GI bleeding  Off NE, CVP 10-11. Back on oral torsemide  General:  Sitting in chair No resp difficulty HEENT: normal Neck: supple. RIJ TLC  CVP to jaw prominent v-waves Carotids 2+ bilat; no bruits. No lymphadenopathy or thryomegaly appreciated. Cor: PMI nondisplaced. Regular rate & rhythm. 2/6 TR Lungs: clear Abdomen: obese  soft, nontender, nondistended. No hepatosplenomegaly. No bruits or masses. Good bowel sounds. Extremities: no cyanosis, clubbing, rash, tr edema Neuro: alert & orientedx3, cranial nerves grossly intact. moves all 4 extremities w/o difficulty. Affect pleasant  Improved with diuresis. Case reviewed with Drs. Thukkani and [employer]. Agree with need for TVR. Tentatively scheduled with Dr. Prescott Gum for next Tuesday pending results of GI endoscopy. Continue diuresis. She is at high risk for recurrent AF.   Glori Bickers, MD  3:40 PM

## 2021-10-30 NOTE — Progress Notes (Signed)
Date and time results received: 10/30/21 0450  Test: Potassium (K+)  Critical Value: 2.4 mmol/L  Name of Provider Notified: Cards Fellow, Dr. Kalman Shan   Orders Received? Or Actions Taken?: Paged Cards Fellow, potassium protocol ordered and started.

## 2021-10-31 ENCOUNTER — Encounter (HOSPITAL_COMMUNITY): Payer: Medicare PPO

## 2021-10-31 ENCOUNTER — Encounter (HOSPITAL_COMMUNITY)
Admission: AD | Disposition: E | Payer: Self-pay | Source: Other Acute Inpatient Hospital | Attending: Cardiothoracic Surgery

## 2021-10-31 ENCOUNTER — Inpatient Hospital Stay (HOSPITAL_COMMUNITY): Payer: Medicare PPO | Admitting: Registered Nurse

## 2021-10-31 ENCOUNTER — Inpatient Hospital Stay (HOSPITAL_COMMUNITY): Payer: Medicare PPO

## 2021-10-31 ENCOUNTER — Encounter (HOSPITAL_COMMUNITY): Payer: Self-pay | Admitting: Internal Medicine

## 2021-10-31 DIAGNOSIS — K449 Diaphragmatic hernia without obstruction or gangrene: Secondary | ICD-10-CM | POA: Diagnosis not present

## 2021-10-31 DIAGNOSIS — I3139 Other pericardial effusion (noninflammatory): Secondary | ICD-10-CM | POA: Diagnosis not present

## 2021-10-31 DIAGNOSIS — K5751 Diverticulosis of both small and large intestine without perforation or abscess with bleeding: Secondary | ICD-10-CM

## 2021-10-31 DIAGNOSIS — K921 Melena: Secondary | ICD-10-CM

## 2021-10-31 DIAGNOSIS — K635 Polyp of colon: Secondary | ICD-10-CM | POA: Diagnosis not present

## 2021-10-31 DIAGNOSIS — D122 Benign neoplasm of ascending colon: Secondary | ICD-10-CM | POA: Diagnosis not present

## 2021-10-31 DIAGNOSIS — K633 Ulcer of intestine: Secondary | ICD-10-CM | POA: Diagnosis not present

## 2021-10-31 HISTORY — PX: ESOPHAGOGASTRODUODENOSCOPY (EGD) WITH PROPOFOL: SHX5813

## 2021-10-31 HISTORY — PX: POLYPECTOMY: SHX5525

## 2021-10-31 HISTORY — PX: BIOPSY: SHX5522

## 2021-10-31 HISTORY — PX: COLONOSCOPY WITH PROPOFOL: SHX5780

## 2021-10-31 LAB — CBC
HCT: 29.4 % — ABNORMAL LOW (ref 36.0–46.0)
Hemoglobin: 8.7 g/dL — ABNORMAL LOW (ref 12.0–15.0)
MCH: 28.5 pg (ref 26.0–34.0)
MCHC: 29.6 g/dL — ABNORMAL LOW (ref 30.0–36.0)
MCV: 96.4 fL (ref 80.0–100.0)
Platelets: 372 10*3/uL (ref 150–400)
RBC: 3.05 MIL/uL — ABNORMAL LOW (ref 3.87–5.11)
RDW: 19.4 % — ABNORMAL HIGH (ref 11.5–15.5)
WBC: 7.4 10*3/uL (ref 4.0–10.5)
nRBC: 0.3 % — ABNORMAL HIGH (ref 0.0–0.2)

## 2021-10-31 LAB — COMPREHENSIVE METABOLIC PANEL
ALT: 26 U/L (ref 0–44)
AST: 36 U/L (ref 15–41)
Albumin: 2.3 g/dL — ABNORMAL LOW (ref 3.5–5.0)
Alkaline Phosphatase: 110 U/L (ref 38–126)
Anion gap: 9 (ref 5–15)
BUN: 15 mg/dL (ref 8–23)
CO2: 26 mmol/L (ref 22–32)
Calcium: 8.6 mg/dL — ABNORMAL LOW (ref 8.9–10.3)
Chloride: 106 mmol/L (ref 98–111)
Creatinine, Ser: 1.49 mg/dL — ABNORMAL HIGH (ref 0.44–1.00)
GFR, Estimated: 38 mL/min — ABNORMAL LOW (ref >60)
Glucose, Bld: 108 mg/dL — ABNORMAL HIGH (ref 70–99)
Potassium: 2.7 mmol/L — CL (ref 3.5–5.1)
Sodium: 141 mmol/L (ref 135–145)
Total Bilirubin: 0.6 mg/dL (ref 0.3–1.2)
Total Protein: 5.5 g/dL — ABNORMAL LOW (ref 6.5–8.1)

## 2021-10-31 LAB — MAGNESIUM: Magnesium: 2.1 mg/dL (ref 1.7–2.4)

## 2021-10-31 SURGERY — ESOPHAGOGASTRODUODENOSCOPY (EGD) WITH PROPOFOL
Anesthesia: Monitor Anesthesia Care

## 2021-10-31 MED ORDER — POTASSIUM CHLORIDE 10 MEQ/50ML IV SOLN
10.0000 meq | INTRAVENOUS | Status: AC
  Administered 2021-10-31 (×6): 10 meq via INTRAVENOUS
  Filled 2021-10-31: qty 50

## 2021-10-31 MED ORDER — PHENYLEPHRINE HCL (PRESSORS) 10 MG/ML IV SOLN
INTRAVENOUS | Status: DC | PRN
  Administered 2021-10-31: 120 ug via INTRAVENOUS
  Administered 2021-10-31: 80 ug via INTRAVENOUS
  Administered 2021-10-31: 100 ug via INTRAVENOUS
  Administered 2021-10-31: 120 ug via INTRAVENOUS

## 2021-10-31 MED ORDER — TORSEMIDE 20 MG PO TABS: 40.0000 mg | ORAL_TABLET | Freq: Two times a day (BID) | ORAL | Status: DC

## 2021-10-31 MED ORDER — LIDOCAINE VISCOUS HCL 2 % MT SOLN
OROMUCOSAL | Status: AC
  Filled 2021-10-31: qty 15

## 2021-10-31 MED ORDER — LACTATED RINGERS IV SOLN
INTRAVENOUS | Status: AC | PRN
  Administered 2021-10-31: 1000 mL via INTRAVENOUS

## 2021-10-31 MED ORDER — LIDOCAINE 2% (20 MG/ML) 5 ML SYRINGE
INTRAMUSCULAR | Status: DC | PRN
  Administered 2021-10-31: 100 mg via INTRAVENOUS

## 2021-10-31 MED ORDER — PROPOFOL 10 MG/ML IV BOLUS
INTRAVENOUS | Status: DC | PRN
  Administered 2021-10-31 (×5): 10 mg via INTRAVENOUS
  Administered 2021-10-31: 20 mg via INTRAVENOUS

## 2021-10-31 SURGICAL SUPPLY — 25 items

## 2021-10-31 NOTE — Transfer of Care (Signed)
Immediate Anesthesia Transfer of Care Note  Patient: [name]  Procedure(s) Performed: ESOPHAGOGASTRODUODENOSCOPY (EGD) WITH PROPOFOL COLONOSCOPY WITH PROPOFOL BIOPSY POLYPECTOMY  Patient Location: PACU  Anesthesia Type:MAC  Level of Consciousness: awake  Airway & Oxygen Therapy: Patient Spontanous Breathing  Post-op Assessment: Report given to RN  Post vital signs: stable; baseline vital signs   Last Vitals:  Vitals Value Taken Time  BP 85/57 11/01/2021 1114  Temp 36.3 C 10/11/2021 1110  Pulse 112 10/11/2021 1116  Resp 24 10/30/2021 1116  SpO2 93 % 10/23/2021 1116  Vitals shown include unvalidated device data.  Last Pain:  Vitals:   10/12/2021 1110  TempSrc:   PainSc: 0-No pain         Complications: No notable events documented.

## 2021-10-31 NOTE — Op Note (Signed)
[facility] Patient Name: [name] Procedure Date : 10/11/2021 MRN: [id number] Attending MD: Jackquline Denmark , MD Date of Birth: 13-Aug-1956 CSN: [id number] Age: 66 Admit Type: Inpatient Procedure:                Upper GI endoscopy Indications:              GI Bleed- R/O UGI lesions. H/O GERD Providers:                Jackquline Denmark, MD, Grace Isaac, RN, Frazier Richards,                            Technician Referring MD:              Medicines:                Monitored Anesthesia Care Complications:            No immediate complications. Estimated Blood Loss:     Estimated blood loss: none. Procedure:                Pre-Anesthesia Assessment:                           - Prior to the procedure, a History and Physical                            was performed, and patient medications and                            allergies were reviewed. The patient's tolerance of                            previous anesthesia was also reviewed. The risks                            and benefits of the procedure and the sedation                            options and risks were discussed with the patient.                            All questions were answered, and informed consent                            was obtained. Prior Anticoagulants: The patient has                            taken Plavix (clopidogrel), last dose was 5 days                            prior to procedure. ASA Grade Assessment: IV - A                            patient with severe systemic disease that is a  constant threat to life. After reviewing the risks                            and benefits, the patient was deemed in                            satisfactory condition to undergo the procedure.                           After obtaining informed consent, the endoscope was                            passed under direct vision. Throughout the                            procedure, the  patient's blood pressure, pulse, and                            oxygen saturations were monitored continuously. The                            [id number]-[id number] ([id number]) Olympus endoscope was introduced                            through the mouth, and advanced to the second part                            of duodenum. The upper GI endoscopy was                            accomplished without difficulty. The patient                            tolerated the procedure well. Scope In: Scope Out: Findings:      The examined esophagus was normal with well-defined Z-line. A small       transient hiatal hernia was noted.      The entire examined stomach was normal.      A small non-bleeding periampullary diverticulum was found in the second       portion of the duodenum.      The exam was otherwise without abnormality. Impression:               - Minimal hiatal hernia.                           - Non-bleeding incidental duodenal diverticulum.                           - The examination was otherwise normal.                           - No specimens collected. Recommendation:           - Resume previous diet.                           -  Continue current medications.                           - Proceed with colonoscopy.                           - The findings and recommendations were discussed                            with the patient's family. Procedure Code(s):        --- Professional ---                           [phone], Esophagogastroduodenoscopy, flexible,                            transoral; diagnostic, including collection of                            specimen(s) by brushing or washing, when performed                            (separate procedure) Diagnosis Code(s):        --- Professional ---                           K92.1, Melena (includes Hematochezia)                           K57.10, Diverticulosis of small intestine without                            perforation or abscess without  bleeding CPT copyright 2019 [employer]. All rights reserved. The codes documented in this report are preliminary and upon coder review may  be revised to meet current compliance requirements. Jackquline Denmark, MD 10/14/2021 10:36:33 AM This report has been signed electronically. Number of Addenda: 0

## 2021-10-31 NOTE — Progress Notes (Signed)
Pt in bed, BP 76/60. Recheck 83/50. Sts she has been up with staff in room during admission. Will hold ambulation for now given effusion and TR.  Discussed  IS (500 ml), sternal precautions, mobility post op and d/c planning with pt and husband. Receptive, appropriate questions. Gave materials to review.  [place] 2:33 PM 10/21/2021

## 2021-10-31 NOTE — Anesthesia Preprocedure Evaluation (Signed)
Anesthesia Evaluation  Patient identified by MRN, date of birth, ID band Patient awake    Reviewed: Allergy & Precautions, NPO status , Patient's Chart, lab work & pertinent test results  History of Anesthesia Complications Negative for: history of anesthetic complications  Airway Mallampati: II  TM Distance: >3 FB Neck ROM: Full    Dental  (+) Dental Advisory Given   Pulmonary sleep apnea (does not use CPAP) ,    breath sounds clear to auscultation       Cardiovascular hypertension, Pt. 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, Trivial MR, mod-severe TR   Neuro/Psych negative neurological ROS  negative psych ROS   GI/Hepatic Neg liver ROS, GERD  Medicated and Controlled,  Endo/Other  Morbid obesity  Renal/GU Renal InsufficiencyRenal disease  negative genitourinary   Musculoskeletal negative musculoskeletal ROS (+)   Abdominal (+) + obese,   Peds  Hematology Xarelto   Anesthesia Other Findings H/o breast cancer  Reproductive/Obstetrics                             Anesthesia Physical  Anesthesia Plan  ASA: 4  Anesthesia Plan: MAC   Post-op Pain Management: Minimal or no pain anticipated   Induction:   PONV Risk Score and Plan: 2 and Treatment may vary due to age or medical condition and Ondansetron  Airway Management Planned: Natural Airway and Simple Face Mask  Additional Equipment: None  Intra-op Plan:   Post-operative Plan:   Informed Consent: I have reviewed the patients History and Physical, chart, labs and discussed the procedure including the risks, benefits and alternatives for the proposed  anesthesia with the patient or authorized representative who has indicated his/her understanding and acceptance.       Plan Discussed with: CRNA  Anesthesia Plan Comments:         Anesthesia Quick Evaluation

## 2021-10-31 NOTE — Interval H&P Note (Signed)
History and Physical Interval Note:  10/26/2021 10:02 AM  [name]  has presented today for surgery, with the diagnosis of anemia, gi bleed.  The various methods of treatment have been discussed with the patient and family. After consideration of risks, benefits and other options for treatment, the patient has consented to  Procedure(s): ESOPHAGOGASTRODUODENOSCOPY (EGD) WITH PROPOFOL (N/A) COLONOSCOPY WITH PROPOFOL (N/A) as a surgical intervention.  The patient's history has been reviewed, patient examined, no change in status, stable for surgery.  I have reviewed the patient's chart and labs.  Questions were answered to the patient's satisfaction.     Jackquline Denmark

## 2021-10-31 NOTE — Progress Notes (Signed)
0220- Lab called with critical value of 2.7 Potassium. Dr. Launa Grill paged at this time for replacement orders.  8934- replacement orders placed.

## 2021-10-31 NOTE — Progress Notes (Addendum)
Advanced Heart Failure Rounding Note  PCP-Cardiologist: None   Subjective:   02/20: Transferred to [facility] for GI bleed + pericardial effusion 02/22: R/LHC with normal cors, equalized R and L sided pressures with no evidence of LV-RV interaction, RA 17, PA 33/21 (25), Fick CO/CI 4.2/2.2, severely reduced PAPi (in setting of severe TR), PA sat 47%, 48% 2/24 S/P Two polyps in the distal ascending colon, removed with a cold snare. Resected and retrieved. Right colonic ischemic ulcers (healing, no active bleeding, biopsied)-likely etiology of LGI Bleed. Moderate sigmoid diverticulosis.   Feels ok. Denies SOB.     Objective:   Weight Range: 86.3 kg Body mass index is 34.8 kg/m.   Vital Signs:   Temp:  [97.2 F (36.2 C)-98.4 F (36.9 C)] 97.5 F (36.4 C) (02/24 1140) Pulse Rate:  [79-129] 107 (02/24 1140) Resp:  [15-24] 23 (02/24 1140) BP: (76-116)/(37-87) 90/56 (02/24 1140) SpO2:  [81 %-100 %] 93 % (02/24 1140) Weight:  [86.3 kg] 86.3 kg (02/24 0600) Last BM Date : 10/30/21  Weight change: Filed Weights   10/10/2021 0600 10/30/21 0600 10/14/2021 0600  Weight: 89.5 kg 87.3 kg 86.3 kg    Intake/Output:   Intake/Output Summary (Last 24 hours) at 10/22/2021 1248 Last data filed at 10/15/2021 2979 Gross per 24 hour  Intake 1273.12 ml  Output 300 ml  Net 973.12 ml      Physical Exam  CVP 4-5  General:   No resp difficulty HEENT: normal Neck: supple. no JVD. Carotids 2+ bilat; no bruits. No lymphadenopathy or thryomegaly appreciated. Cor: PMI nondisplaced. Tachy Regular rate & rhythm. No rubs, gallops .  3/6 TR  Lungs: clear Abdomen: soft, nontender, nondistended. No hepatosplenomegaly. No bruits or masses. Good bowel sounds. Extremities: no cyanosis, clubbing, rash, edema Neuro: alert & orientedx3, cranial nerves grossly intact. moves all 4 extremities w/o difficulty. Affect pleasant  Telemetry  ST 100-110s personally checked    Labs    CBC Recent Labs     10/30/21 0400 10/17/2021 0118  WBC 7.1 7.4  HGB 8.0* 8.7*  HCT 26.9* 29.4*  MCV 96.4 96.4  PLT 329 892   Basic Metabolic Panel Recent Labs    10/30/21 0400 10/30/21 1010 10/10/2021 0118  NA 136 134* 141  K 2.4* 3.8 2.7*  CL 100 99 106  CO2 28 26 26   GLUCOSE 92 132* 108*  BUN 18 17 15   CREATININE 1.45* 1.51* 1.49*  CALCIUM 8.2* 8.2* 8.6*  MG 1.7  --  2.1   Liver Function Tests Recent Labs    10/14/2021 0118  AST 36  ALT 26  ALKPHOS 110  BILITOT 0.6  PROT 5.5*  ALBUMIN 2.3*   No results for input(s): LIPASE, AMYLASE in the last 72 hours. Cardiac Enzymes No results for input(s): CKTOTAL, CKMB, CKMBINDEX, TROPONINI in the last 72 hours.  BNP: BNP (last 3 results) Recent Labs    02/24/21 1357 07/16/21 1145 10/30/2021 2138  BNP 326.4* 523.7* 477.9*    ProBNP (last 3 results) No results for input(s): PROBNP in the last 8760 hours.   D-Dimer No results for input(s): DDIMER in the last 72 hours. Hemoglobin A1C No results for input(s): HGBA1C in the last 72 hours. Fasting Lipid Panel No results for input(s): CHOL, HDL, LDLCALC, TRIG, CHOLHDL, LDLDIRECT in the last 72 hours.  Thyroid Function Tests No results for input(s): TSH, T4TOTAL, T3FREE, THYROIDAB in the last 72 hours.  Invalid input(s): FREET3  Other results:   Imaging  No results found.   Medications:     Scheduled Medications:  allopurinol  100 mg Oral Q1500   amiodarone  200 mg Oral Daily   atorvastatin  10 mg Oral q AM   buPROPion  150 mg Oral BID   Chlorhexidine Gluconate Cloth  6 each Topical Daily   cholecalciferol  5,000 Units Oral q AM   empagliflozin  10 mg Oral Daily   multivitamin with minerals  1 tablet Oral Daily   pantoprazole  40 mg Oral Daily   potassium chloride SA  40 mEq Oral Daily   torsemide  40 mg Oral BID   venlafaxine XR  150 mg Oral Q breakfast   zolpidem  5 mg Oral QHS    Infusions:    PRN Medications: acetaminophen, lip balm, nitroGLYCERIN,  ondansetron (ZOFRAN) IV   Assessment/Plan   Pericardial effusion: -Doubt from Watchman procedure in 12/22 -Bedside echo 02/20 with suspicion for tamponade.  -Effusion appears moderate in size on echo 02/21.  -[place] 02/22 with equalization of right and left-sided pressures with no evidence of LV-RV interaction -There does not appear to be any evidence of tamponade. Suspect main issue is severe TR with markedly elevated RA pressures and inability to drain pericardium.  2. Chronic systolic CHF: - felt to have LBBB CM vs chemo induced (got adriamycin). 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 No effusion -Echo 02/21: EF 35-40%, RV severely enlarged with okay function, moderate MR, severe TR with flail posterior leaflet -RHC 02/22: RA 17, PA 33/21 (25), PCWP 18, PAPi 0.6 (in setting of severe TR), PA sat 47%, 48% - Marginal PA sat on RHC. Monitor for signs of low-output - Hold diuretics. CVP  5. - Home spiro and bisoprolol held d/t hypotension - Continue jardiance 10 mg daily  3. Severe TR - with RV failure - TCTS planning for TVR + possible MAZE.   4. PAF/tachycardia - Unable to complete ablation 10/22 d/t pericardial effusion - S/p Watchman implant 08/21/21 - On amiodarone 200 mg daily - Would not add beta blocker or other rate rate controlling medications in setting of RV failure  5. 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  - In setting of GI bleed and hypotension  6. GI bleeding - BRBPR + melena 4-5 days prior to admission - Per her report CT at OSH with diverticulosis but no diverticulitis - Denies NSAID use - 1 u PRBCs this admit - Hgb 8.7 today  - EGD/Colonoscopy- R colonic ischemic ulcers - suspect in the setting of low output hf., 2 polyps removed,  diverticulosis.  - On protonix.   6. OSA - Unable to tolerate CPAP  7. Hypokalemia - K 2.7. Given K runs. Repeat BMET in am.  - Mag 2.1   Length of Stay: 4  Amy Clegg, NP  11/03/2021, 12:48 PM  Advanced Heart Failure Team Pager [phone] (M-F; 7a - 5p)  Please contact [facility] for night-coverage after hours (5p -7a ) and weekends on [email or web address]   Patient seen and examined with the above-signed Advanced Practice Provider and/or Housestaff. I personally reviewed laboratory data, imaging studies and relevant notes. I independently examined the patient and formulated the important aspects of the plan. I have edited the note to reflect any of my changes  or salient points. I have personally discussed the plan with the patient and/or family.  Had rough night with bowel prep. Colonoscopy this am with evidence of ischemic colitis (healing) from low BP.   Denies CP or SOB. Hgb stable CVP in 5 range  General: Sitting in chair No resp difficulty HEENT: normal Neck: supple. no JVD. Carotids 2+ bilat; no bruits. No lymphadenopathy or thryomegaly appreciated. Cor: PMI nondisplaced. Regular rate & rhythm.2/6 TR Lungs: clear Abdomen: obese soft, nontender, nondistended. No hepatosplenomegaly. No bruits or masses. Good bowel sounds. Extremities: no cyanosis, clubbing, rash, edema Neuro: alert & orientedx3, cranial nerves grossly intact. moves all 4 extremities w/o difficulty. Affect pleasant  Hemodynamically stable. Results of colonoscopy discussed. Plan TV ring next Tuesday. Hold diuretics today.   Glori Bickers, MD  3:01 PM

## 2021-10-31 NOTE — Progress Notes (Signed)
Progress Note  Patient Name: [name] Date of Encounter: 11/01/2021  [facility] Cardiologist: None    Subjective   No acute events overnight.    CVP 12-96mmHG  Patient tired due to being up all night for bowel prep.  Denies dyspnea, chest pain, orthopnea, nausea.   Inpatient Medications    Scheduled Meds:  allopurinol  100 mg Oral Q1500   amiodarone  200 mg Oral Daily   atorvastatin  10 mg Oral q AM   buPROPion  150 mg Oral BID   Chlorhexidine Gluconate Cloth  6 each Topical Daily   cholecalciferol  5,000 Units Oral q AM   empagliflozin  10 mg Oral Daily   multivitamin with minerals  1 tablet Oral Daily   pantoprazole  40 mg Oral Daily   potassium chloride SA  40 mEq Oral Daily   torsemide  40 mg Oral BID   venlafaxine XR  150 mg Oral Q breakfast   zolpidem  5 mg Oral QHS   Continuous Infusions:  potassium chloride 10 mEq (10/21/2021 0603)   PRN Meds: acetaminophen, lip balm, nitroGLYCERIN, ondansetron (ZOFRAN) IV   Vital Signs    Vitals:   10/20/2021 0100 10/26/2021 0200 10/30/2021 0300 10/10/2021 0600  BP:  (!) 95/43 (!) 95/59   Pulse:      Resp: 17 16 17    Temp:    98.3 F (36.8 C)  TempSrc:    Oral  SpO2:      Weight:    86.8 kg  Height:        Intake/Output Summary (Last 24 hours) at 10/11/2021 0642 Last data filed at 10/14/2021 0603 Gross per 24 hour  Intake 1320.46 ml  Output 650 ml  Net 670.46 ml    Last 3 Weights 10/26/2021 10/30/2021 10/28/2021  Weight (lbs) 191 lb 5.8 oz 192 lb 7.4 oz 197 lb 5 oz  Weight (kg) 86.8 kg 87.3 kg 89.5 kg      Telemetry    AV pacing, AF - Personally Reviewed  ECG    None today  Physical Exam   GEN: No acute distress.   Neck: RIJ line: +JVD Cardiac: RRR, 3/6 holosystolic murmur.  Respiratory: Clear to auscultation bilaterally. GI: Soft, nontender, non-distended  MS: No edema; No deformity. Neuro:  Nonfocal  Psych: Normal affect   Labs    High Sensitivity Troponin:   Recent Labs  Lab  10/28/21 0000  TROPONINIHS 11      Chemistry Recent Labs  Lab 10/21/2021 2138 10/28/21 0821 10/30/21 0400 10/30/21 1010 10/09/2021 0118  NA 131*   < > 136 134* 141  K 3.1*   < > 2.4* 3.8 2.7*  CL 94*   < > 100 99 106  CO2 24   < > 28 26 26   GLUCOSE 127*   < > 92 132* 108*  BUN 47*   < > 18 17 15   CREATININE 2.78*   < > 1.45* 1.51* 1.49*  CALCIUM 8.4*   < > 8.2* 8.2* 8.6*  MG  --   --  1.7  --  2.1  PROT 5.9*  --   --   --  5.5*  ALBUMIN 2.6*  --   --   --  2.3*  AST 33  --   --   --  36  ALT 25  --   --   --  26  ALKPHOS 111  --   --   --  110  BILITOT  0.9  --   --   --  0.6  GFRNONAA 18*   < > 40* 38* 38*  ANIONGAP 13   < > 8 9 9    < > = values in this interval not displayed.     Lipids  Recent Labs  Lab 10/28/21 0821  CHOL 130  TRIG 153*  HDL 27*  LDLCALC 72  CHOLHDL 4.8     Hematology Recent Labs  Lab 10/20/2021 0345 10/15/2021 1240 10/09/2021 1251 10/30/21 0400 10/08/2021 0118  WBC 12.2*  --   --  7.1 7.4  RBC 3.00*  --   --  2.79* 3.05*  HGB 8.6*   < > 9.9* 8.0* 8.7*  HCT 28.8*   < > 29.0* 26.9* 29.4*  MCV 96.0  --   --  96.4 96.4  MCH 28.7  --   --  28.7 28.5  MCHC 29.9*  --   --  29.7* 29.6*  RDW 19.2*  --   --  18.9* 19.4*  PLT 435*  --   --  329 372   < > = values in this interval not displayed.    Thyroid No results for input(s): TSH, FREET4 in the last 168 hours.  BNP Recent Labs  Lab 10/22/2021 2138  BNP 477.9*     DDimer No results for input(s): DDIMER in the last 168 hours.   Radiology      Cardiac Studies   TTE 10/28/21  1. Left ventricular ejection fraction, by estimation, is 35 to 40%. The  left ventricle has moderately decreased function. The left ventricle  demonstrates global hypokinesis. Left ventricular diastolic parameters are  consistent with Grade II diastolic  dysfunction (pseudonormalization). Elevated left atrial pressure. There is  the interventricular septum is flattened in diastole ('D' shaped left  ventricle),  consistent with right ventricular volume overload.   2. Right ventricular systolic function is low normal. The right  ventricular size is severely enlarged. A is visualized in the coronary  sinus. There is normal pulmonary artery systolic pressure.   3. Left atrial size was mildly dilated.   4. Right atrial size was severely dilated.   5. Moderate pericardial effusion. The pericardial effusion is  circumferential. There is no evidence of cardiac tamponade.   6. The mitral valve is myxomatous. Moderate mitral valve regurgitation.   7. Flail posterior tricuspid valve leaflet. The tricuspid valve is  abnormal. Tricuspid valve regurgitation is severe.   8. The aortic valve is tricuspid. Aortic valve regurgitation is not  visualized. Aortic valve sclerosis is present, with no evidence of aortic  valve stenosis.   9. The inferior vena cava is dilated in size with <50% respiratory  variability, suggesting right atrial pressure of 15 mmHg.    RHC/LHC/cor angio 2/22:  Ao =104/55 (76) LV = 107/18 RA = 17 RV = 34/16 PA = 33/21 (25) PCW = 18 Fick cardiac output/index = 4.2/2.2 PVR = 1.65 Ao sat = 99% PA sat = 47%, 48% PAPi = 0.6   No LV-RE interaction on simultaneous LV-RV tracing   Assessment: 1. Normal coronary arteries 2. EF ~40% 3. Equalized right and left-sided pressure with no evidence of LV-RV interaction 4. Severely reduced PAPi 5. SBP 25 points higher with invasive measurement versus non-invasive. Will leave arterial sheath in overnight to help wean NE  Patient Profile     66 y.o. female with paroxysmal atrial fibrillation status post failed ablation, status post Watchman December 2022, breast cancer status post Adriamycin therapy,  nonischemic cardiomyopathy with ejection fraction 25 to 30% (on TEE improved), status post BiV Medtronic ICD, hypertension, obstructive sleep apnea, and chronic kidney disease here with lower GI bleeding and pericardial effusion.  Assessment & Plan     1.  Pericardial effusion.  Sympathetic effusion due to RV failure and tricuspid regurgitation.  No tamponade present on echocardiogram or invasive assessment.  . 2.  Hypotension: Noninvasive measurement lower than invasive art line BP.  No evidence of poor perfusion.  3.  Nonischemic cardiomyopathy.  BP reasonable and patient not in cardiogenic shock.  Cr remains stable.  No rate controlling agents.  Patient is volume up overnight but output not accurate due to need for toileting due  to prep.  Cont torsemide. 3.  GI bleed: Appreciate GI consult, holding plavix.  EGD/c-scope today.  Watch fluid balance around EGD/cscope 4.  Chronic kidney disease: Cr stable today, cont torsemide, watch Cr, check K/Mg daily; diuresed well yesterday. 5.  Atrial fibrillation status post Watchman: Hold Plavix for now; TEE demonstrated no leaks in January.  Cont amiodarone 6.  RV failure/dysfunction with severe TR: Tolerating rapid rates,  LFTs/Cr ok.  No signs of poor perfusion.    For questions or updates, please contact [facility] Please consult [email or web address] for contact info under        Signed, Early Osmond, MD  10/20/2021, 6:42 AM    CRITICAL CARE Performed by: Lenna Sciara   Total critical care time: 35 minutes. Critical care time was exclusive of separately billable procedures and treating other patients. Critical care was necessary to treat or prevent imminent or life-threatening deterioration. Critical care was time spent personally by me on the following activities: development of treatment plan with patient and/or surrogate as well as nursing, discussions with consultants, evaluation of patient's response to treatment, examination of patient, obtaining history from patient or surrogate, ordering and performing treatments and interventions, ordering and review of laboratory studies, ordering and review of radiographic studies, pulse oximetry and re-evaluation of patient's condition.

## 2021-10-31 NOTE — Op Note (Addendum)
[facility] Patient Name: [name] Procedure Date : 11/03/2021 MRN: [id number] Attending MD: Jackquline Denmark , MD Date of Birth: [birth year] CSN: [id number] Age: 66 Admit Type: Inpatient Procedure:                Colonoscopy Indications:              GI Bleed- Hb 12 (09/2021) to 8.8 s/p1U to 9.6. Neg                            EGD. Providers:                Jackquline Denmark, MD, Grace Isaac, RN, Frazier Richards,                            Technician Referring MD:              Medicines:                Monitored Anesthesia Care Complications:            No immediate complications. Estimated Blood Loss:     Estimated blood loss: none. Procedure:                Pre-Anesthesia Assessment:                           - Prior to the procedure, a History and Physical                            was performed, and patient medications and                            allergies were reviewed. The patient's tolerance of                            previous anesthesia was also reviewed. The risks                            and benefits of the procedure and the sedation                            options and risks were discussed with the patient.                            All questions were answered, and informed consent                            was obtained. Prior Anticoagulants: The patient has                            taken Plavix (clopidogrel), last dose was 5 days                            prior to procedure. ASA Grade Assessment: IV - A  patient with severe systemic disease that is a                            constant threat to life. After reviewing the risks                            and benefits, the patient was deemed in                            satisfactory condition to undergo the procedure.                           After obtaining informed consent, the colonoscope                            was passed under direct vision. Throughout the                             procedure, the patient's blood pressure, pulse, and                            oxygen saturations were monitored continuously. The                            CF-HQ190L ([id number]) Olympus coloscope was                            introduced through the anus and advanced to the 2                            cm into the ileum. The colonoscopy was performed                            without difficulty. The patient tolerated the                            procedure well. The quality of the bowel                            preparation was good. The terminal ileum, ileocecal                            valve, appendiceal orifice, and rectum were                            photographed. Scope In: 10:38:53 AM Scope Out: 11:02:00 AM Scope Withdrawal Time: 0 hours 16 minutes 15 seconds  Total Procedure Duration: 0 hours 23 minutes 7 seconds  Findings:      Two sessile polyps were found in the distal ascending colon. The polyps       were 4 to 6 mm in size. These polyps were removed with a cold snare.       Resection and retrieval were complete.      Discontinuous areas of nonbleeding ulcerated mucosa with no  stigmata of       recent bleeding were present in the proximal ascending colon and in the       cecum. These are highly suggestive of ischemic colitis. The remaining       colonic mucosa including TI was normal. Biopsies were taken with a cold       forceps for histology.      Multiple medium-mouthed diverticula were found in the sigmoid colon.      Non-bleeding internal hemorrhoids were found during retroflexion. The       hemorrhoids were small and Grade I (internal hemorrhoids that do not       prolapse).      The terminal ileum appeared normal.      The exam was otherwise without abnormality on direct and retroflexion       views. Impression:               - Two 4 to 6 mm polyps in the distal ascending                            colon, removed with a cold snare.  Resected and                            retrieved.                           - Right colonic ischemic ulcers (healing, no active                            bleeding, biopsied)-likely etiology of LGI bleed on                            Adm.                           - Moderate sigmoid diverticulosis.                           - The examined portion of the ileum was normal.                           - The examination was otherwise normal on direct                            and retroflexion views. Recommendation:           - Return patient to ICU for ongoing care.                           - Resume previous diet.                           - Await pathology results.                           - The findings and recommendations were discussed  with the patient's family.                           - Will discuss above with cardiology regarding any                            further course of action. It could be d/t low flow                            state. Mesenteric angiography could be high risk                            d/t CKD/CHF.                           - From GI standpoint can resume Plavix from                            tomorrow onwards if needed. Procedure Code(s):        --- Professional ---                           [id number], Colonoscopy, flexible; with removal of                            tumor(s), polyp(s), or other lesion(s) by snare                            technique                           45380, 4, Colonoscopy, flexible; with biopsy,                            single or multiple Diagnosis Code(s):        --- Professional ---                           K63.5, Polyp of colon                           K63.3, Ulcer of intestine                           K64.0, First degree hemorrhoids                           K92.1, Melena (includes Hematochezia)                           K57.30, Diverticulosis of large intestine without                             perforation or abscess without bleeding CPT copyright 2019 [employer]. All rights reserved. The codes documented in this report are preliminary and upon coder review may  be revised to meet current compliance requirements. Jackquline Denmark,  MD 10/26/2021 11:16:34 AM This report has been signed electronically. Number of Addenda: 0

## 2021-11-01 ENCOUNTER — Inpatient Hospital Stay (HOSPITAL_COMMUNITY): Payer: Medicare PPO

## 2021-11-01 DIAGNOSIS — I4819 Other persistent atrial fibrillation: Secondary | ICD-10-CM | POA: Diagnosis not present

## 2021-11-01 DIAGNOSIS — I361 Nonrheumatic tricuspid (valve) insufficiency: Secondary | ICD-10-CM

## 2021-11-01 DIAGNOSIS — I071 Rheumatic tricuspid insufficiency: Secondary | ICD-10-CM | POA: Diagnosis not present

## 2021-11-01 DIAGNOSIS — I3139 Other pericardial effusion (noninflammatory): Secondary | ICD-10-CM | POA: Diagnosis not present

## 2021-11-01 LAB — COMPREHENSIVE METABOLIC PANEL
ALT: 23 U/L (ref 0–44)
AST: 32 U/L (ref 15–41)
Albumin: 2.3 g/dL — ABNORMAL LOW (ref 3.5–5.0)
Alkaline Phosphatase: 105 U/L (ref 38–126)
Anion gap: 8 (ref 5–15)
BUN: 14 mg/dL (ref 8–23)
CO2: 25 mmol/L (ref 22–32)
Calcium: 9.2 mg/dL (ref 8.9–10.3)
Chloride: 105 mmol/L (ref 98–111)
Creatinine, Ser: 1.5 mg/dL — ABNORMAL HIGH (ref 0.44–1.00)
GFR, Estimated: 38 mL/min — ABNORMAL LOW (ref >60)
Glucose, Bld: 104 mg/dL — ABNORMAL HIGH (ref 70–99)
Potassium: 4.1 mmol/L (ref 3.5–5.1)
Sodium: 138 mmol/L (ref 135–145)
Total Bilirubin: 0.5 mg/dL (ref 0.3–1.2)
Total Protein: 5.4 g/dL — ABNORMAL LOW (ref 6.5–8.1)

## 2021-11-01 LAB — MAGNESIUM: Magnesium: 2.2 mg/dL (ref 1.7–2.4)

## 2021-11-01 LAB — CBC
HCT: 29.6 % — ABNORMAL LOW (ref 36.0–46.0)
Hemoglobin: 8.7 g/dL — ABNORMAL LOW (ref 12.0–15.0)
MCH: 29.3 pg (ref 26.0–34.0)
MCHC: 29.4 g/dL — ABNORMAL LOW (ref 30.0–36.0)
MCV: 99.7 fL (ref 80.0–100.0)
Platelets: 371 10*3/uL (ref 150–400)
RBC: 2.97 MIL/uL — ABNORMAL LOW (ref 3.87–5.11)
RDW: 19.6 % — ABNORMAL HIGH (ref 11.5–15.5)
WBC: 6.6 10*3/uL (ref 4.0–10.5)
nRBC: 0.3 % — ABNORMAL HIGH (ref 0.0–0.2)

## 2021-11-01 LAB — SURGICAL PCR SCREEN
MRSA, PCR: NEGATIVE
Staphylococcus aureus: NEGATIVE

## 2021-11-01 MED ORDER — POTASSIUM CHLORIDE CRYS ER 20 MEQ PO TBCR
40.0000 meq | EXTENDED_RELEASE_TABLET | Freq: Once | ORAL | Status: AC
  Administered 2021-11-01: 40 meq via ORAL
  Filled 2021-11-01: qty 2

## 2021-11-01 MED ORDER — FE FUMARATE-B12-VIT C-FA-IFC PO CAPS
1.0000 | ORAL_CAPSULE | Freq: Two times a day (BID) | ORAL | Status: DC
  Administered 2021-11-01 – 2021-11-03 (×6): 1 via ORAL
  Filled 2021-11-01 (×8): qty 1

## 2021-11-01 MED ORDER — HYDROXYZINE HCL 10 MG PO TABS
10.0000 mg | ORAL_TABLET | Freq: Three times a day (TID) | ORAL | Status: DC | PRN
  Administered 2021-11-01: 10 mg via ORAL
  Filled 2021-11-01 (×3): qty 1

## 2021-11-01 MED ORDER — FUROSEMIDE 10 MG/ML IJ SOLN
80.0000 mg | Freq: Once | INTRAMUSCULAR | Status: AC
  Administered 2021-11-01: 80 mg via INTRAVENOUS
  Filled 2021-11-01: qty 8

## 2021-11-01 MED ORDER — TORSEMIDE 20 MG PO TABS
60.0000 mg | ORAL_TABLET | Freq: Two times a day (BID) | ORAL | Status: DC
  Administered 2021-11-02 (×2): 60 mg via ORAL
  Filled 2021-11-01 (×2): qty 3

## 2021-11-01 MED ORDER — BOOST PO LIQD
237.0000 mL | Freq: Three times a day (TID) | ORAL | Status: DC
  Administered 2021-11-01 – 2021-11-03 (×7): 237 mL via ORAL
  Filled 2021-11-01 (×10): qty 237

## 2021-11-01 NOTE — Progress Notes (Signed)
1 Day Post-Op Procedure(s) (LRB): ESOPHAGOGASTRODUODENOSCOPY (EGD) WITH PROPOFOL (N/A) COLONOSCOPY WITH PROPOFOL (N/A) BIOPSY POLYPECTOMY Subjective: Walking in room Afib with paced rate Ischemic colits with mucosal ulcer by colonoscopy- currently w/o pain and tolerating diet. No acidosis, elevated LFTs Off plavix , heparin due to recent GI bleeding Objective: Vital signs in last 24 hours: Temp:  [97.4 F (36.3 C)-98.2 F (36.8 C)] 97.7 F (36.5 C) (02/25 0746) Pulse Rate:  [79-114] 108 (02/25 0600) Cardiac Rhythm: Sinus tachycardia (02/25 0400) Resp:  [16-28] 25 (02/25 0600) BP: (80-104)/(37-80) 96/51 (02/25 0600) SpO2:  [90 %-100 %] 100 % (02/25 0600)  Hemodynamic parameters for last 24 hours: CVP:  [5 mmHg-11 mmHg] 5 mmHg  Intake/Output from previous day: 02/24 0701 - 02/25 0700 In: 723.1 [P.O.:600; IV Piggyback:123.1] Out: -  Intake/Output this shift: Total I/O In: 120 [P.O.:120] Out: -   Soft TR murmur Mild edema CXR, lungs clear  Lab Results: Recent Labs    10/12/2021 0118 11/01/21 0506  WBC 7.4 6.6  HGB 8.7* 8.7*  HCT 29.4* 29.6*  PLT 372 371   BMET:  Recent Labs    10/25/2021 0118 11/01/21 0506  NA 141 138  K 2.7* 4.1  CL 106 105  CO2 26 25  GLUCOSE 108* 104*  BUN 15 14  CREATININE 1.49* 1.50*  CALCIUM 8.6* 9.2    PT/INR:  Recent Labs    10/30/21 0400  LABPROT 15.0  INR 1.2   ABG    Component Value Date/Time   PHART 7.421 10/17/2021 1240   HCO3 24.6 11/01/2021 1251   TCO2 26 11/03/2021 1251   ACIDBASEDEF 1.0 10/12/2021 1250   O2SAT 47 10/19/2021 1251   CBG (last 3)  No results for input(s): GLUCAP in the last 72 hours.  Assessment/Plan: S/P Procedure(s) (LRB): ESOPHAGOGASTRODUODENOSCOPY (EGD) WITH PROPOFOL (N/A) COLONOSCOPY WITH PROPOFOL (N/A) BIOPSY POLYPECTOMY Plan Tricuspid valve repair-replacement Tues with possible [place] Maze ablation. Push nutrition and po Fe for moderate protein malnutrition and blood loss anemia. She  understands that there is a risk for recurrent ischemic colitis due to stress of heart surgery  LOS: 5 days    Dahlia Byes 11/01/2021

## 2021-11-01 NOTE — Anesthesia Postprocedure Evaluation (Signed)
Anesthesia Post Note  Patient: [name]  Procedure(s) Performed: ESOPHAGOGASTRODUODENOSCOPY (EGD) WITH PROPOFOL COLONOSCOPY WITH PROPOFOL BIOPSY POLYPECTOMY     Patient location during evaluation: Endoscopy Anesthesia Type: MAC Level of consciousness: awake Pain management: pain level controlled Vital Signs Assessment: post-procedure vital signs reviewed and stable Respiratory status: spontaneous breathing Cardiovascular status: stable Postop Assessment: no apparent nausea or vomiting Anesthetic complications: no   No notable events documented.  Last Vitals:  Vitals:   11/01/21 1000 11/01/21 1100  BP: 109/75 (!) 111/58  Pulse: (!) 105 (!) 104  Resp: 20 (!) 24  Temp:    SpO2: 98% (!) 89%    Last Pain:  Vitals:   11/01/21 0800  TempSrc:   PainSc: 0-No pain                 Huston Foley

## 2021-11-01 NOTE — Progress Notes (Signed)
Advanced Heart Failure Rounding Note  PCP-Cardiologist: None   Subjective:   02/20: Transferred to [facility] for GI bleed + pericardial effusion 02/22: R/LHC with normal cors, equalized R and L sided pressures with no evidence of LV-RV interaction, RA 17, PA 33/21 (25), Fick CO/CI 4.2/2.2, severely reduced PAPi (in setting of severe TR), PA sat 47%, 48% 2/24 S/P Two polyps in the distal ascending colon, removed with a cold snare. Resected and retrieved. Right colonic ischemic ulcers (healing, no active bleeding, biopsied)-likely etiology of LGI Bleed. Moderate sigmoid diverticulosis.   Feels ok. Denies CP or SOB. CVP back up to 15-16 with prominent v waves.   Eating without difficulty. No ab pain. No further blood in stool     Objective:   Weight Range: 86.3 kg Body mass index is 34.8 kg/m.   Vital Signs:   Temp:  [97.7 F (36.5 C)-98.2 F (36.8 C)] 98 F (36.7 C) (02/25 1215) Pulse Rate:  [96-114] 104 (02/25 1100) Resp:  [16-28] 24 (02/25 1100) BP: (80-111)/(37-80) 111/58 (02/25 1100) SpO2:  [89 %-100 %] 89 % (02/25 1100) Last BM Date : 10/30/21  Weight change: Filed Weights   10/24/2021 0600 10/30/21 0600 10/09/2021 0600  Weight: 89.5 kg 87.3 kg 86.3 kg    Intake/Output:   Intake/Output Summary (Last 24 hours) at 11/01/2021 1251 Last data filed at 11/01/2021 1245 Gross per 24 hour  Intake 1080 ml  Output --  Net 1080 ml       Physical Exam  CVP 15 General:  Sitting up in bed No resp difficulty HEENT: normal Neck: supple. RIJ TLC Carotids 2+ bilat; no bruits. No lymphadenopathy or thryomegaly appreciated. Cor: PMI nondisplaced. Tachy Regular rate & rhythm. 2/6 TR Lungs: clear Abdomen: obese  soft, nontender, nondistended. No hepatosplenomegaly. No bruits or masses. Good bowel sounds. Extremities: no cyanosis, clubbing, rash, 1+ edema Neuro: alert & orientedx3, cranial nerves grossly intact. moves all 4 extremities w/o difficulty. Affect pleasant   Telemetry    ST 100-110 Personally reviewed    Labs    CBC Recent Labs    10/24/2021 0118 11/01/21 0506  WBC 7.4 6.6  HGB 8.7* 8.7*  HCT 29.4* 29.6*  MCV 96.4 99.7  PLT 372 149    Basic Metabolic Panel Recent Labs    10/28/2021 0118 11/01/21 0506  NA 141 138  K 2.7* 4.1  CL 106 105  CO2 26 25  GLUCOSE 108* 104*  BUN 15 14  CREATININE 1.49* 1.50*  CALCIUM 8.6* 9.2  MG 2.1 2.2    Liver Function Tests Recent Labs    10/24/2021 0118 11/01/21 0506  AST 36 32  ALT 26 23  ALKPHOS 110 105  BILITOT 0.6 0.5  PROT 5.5* 5.4*  ALBUMIN 2.3* 2.3*    No results for input(s): LIPASE, AMYLASE in the last 72 hours. Cardiac Enzymes No results for input(s): CKTOTAL, CKMB, CKMBINDEX, TROPONINI in the last 72 hours.  BNP: BNP (last 3 results) Recent Labs    02/24/21 1357 07/16/21 1145 10/10/2021 2138  BNP 326.4* 523.7* 477.9*     ProBNP (last 3 results) No results for input(s): PROBNP in the last 8760 hours.   D-Dimer No results for input(s): DDIMER in the last 72 hours. Hemoglobin A1C No results for input(s): HGBA1C in the last 72 hours. Fasting Lipid Panel No results for input(s): CHOL, HDL, LDLCALC, TRIG, CHOLHDL, LDLDIRECT in the last 72 hours.  Thyroid Function Tests No results for input(s): TSH, T4TOTAL, T3FREE, THYROIDAB in the  last 72 hours.  Invalid input(s): FREET3  Other results:   Imaging    DG Chest Port 1 View  Result Date: 11/01/2021 CLINICAL DATA:  Shortness of breath. EXAM: PORTABLE CHEST 1 VIEW COMPARISON:  10/28/2021 and older studies. FINDINGS: Stable enlargement of the cardiopericardial silhouette. No mediastinal or hilar masses. There is opacity at the left lung base consistent with a combination of prominent cardiophrenic angle fat and mild atelectasis. Remainder of the lungs is clear. Right anterior chest wall AICD, right internal jugular central venous line and left atrial occlusive device are stable. No pneumothorax. IMPRESSION: 1. No acute  findings.  No evidence of pneumonia or pulmonary edema. 2. Stable cardiomegaly. Electronically Signed   By: Lajean Manes M.D.   On: 11/01/2021 08:27     Medications:     Scheduled Medications:  allopurinol  100 mg Oral Q1500   amiodarone  200 mg Oral Daily   atorvastatin  10 mg Oral q AM   buPROPion  150 mg Oral BID   Chlorhexidine Gluconate Cloth  6 each Topical Daily   cholecalciferol  5,000 Units Oral q AM   empagliflozin  10 mg Oral Daily   ferrous [id number] C-folic acid  1 capsule Oral BID PC   lactose free nutrition  237 mL Oral TID WC   multivitamin with minerals  1 tablet Oral Daily   pantoprazole  40 mg Oral Daily   potassium chloride  40 mEq Oral Once   [START ON 11/02/2021] torsemide  60 mg Oral BID   venlafaxine XR  150 mg Oral Q breakfast   zolpidem  5 mg Oral QHS    Infusions:    PRN Medications: acetaminophen, hydrOXYzine, lip balm, nitroGLYCERIN, ondansetron (ZOFRAN) IV   Assessment/Plan   Pericardial effusion: -Doubt from Watchman procedure in 12/22 -Effusion appears moderate in size on echo 02/21.  -[place] 02/22 with equalization of right and left-sided pressures with no evidence of LV-RV interaction -There does not appear to be any evidence of tamponade. Suspect main issue is severe TR with markedly elevated RA pressures and inability to drain pericardium.  2. Chronic systolic CHF: - felt to have LBBB CM vs chemo induced (got adriamycin). 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 No effusion -Echo 02/21: EF 35-40%, RV severely enlarged with okay function, moderate MR, severe TR with flail posterior leaflet -RHC 02/22: RA 17, PA 33/21 (25), PCWP 18, PAPi 0.6 (in setting of severe TR), PA sat 47%, 48% - Marginal PA sat on RHC. Monitor for signs of  low-output - Volume back up. Restart diuretics.  - Home spiro and bisoprolol held d/t hypotension - Continue jardiance 10 mg daily  3. Severe TR - with RV failure - TCTS planning for TVR + possible MAZE.   4. PAF/tachycardia - Unable to complete ablation 10/22 d/t pericardial effusion - S/p Watchman implant 08/21/21 - On amiodarone 200 mg daily - Would not add beta blocker or other rate rate controlling medications in setting of RV failure  5. 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 GI bleeding - BRBPR + melena 4-5 days prior to admission - Per her report CT at OSH with diverticulosis but no diverticulitis - Denies NSAID use -  1 u PRBCs this admit - Hgb 8.7 today  - EGD/Colonoscopy- R colonic ischemic ulcers - suspect in the setting of low output hf., 2 polyps removed, diverticulosis.  - On protonix.   6. OSA - Unable to tolerate CPAP  7. Hypokalemia - improve  8. Ischemic colitis - suspect related to mesenteric ischemia in setting of low arterial pressure and high venous outflow pressures from RV failure - No h/o ischemic colitis symptoms - Has CT abdomen from OSH (non contrast) no documentation of ab arterial calcification - D/w VVS. Could get contrast CT abdomen to further evaluate for mesenteric disease but suspicion low given lack of RFs and/or previous symptoms. With Scr 1.5, I think the risk of CT outweighs benefit.    Length of Stay: [place], MD  11/01/2021, 12:51 PM  Advanced Heart Failure Team Pager [phone] (M-F; 7a - 5p)  Please contact [facility] for night-coverage after hours (5p -7a ) and weekends on [email or web address]

## 2021-11-01 NOTE — Progress Notes (Signed)
Progress Note  Patient Name: [name] Date of Encounter: 11/01/2021  [facility] Cardiologist: Dr Caryl Comes  Subjective   No CP or dyspnea  Inpatient Medications    Scheduled Meds:  allopurinol  100 mg Oral Q1500   amiodarone  200 mg Oral Daily   atorvastatin  10 mg Oral q AM   buPROPion  150 mg Oral BID   Chlorhexidine Gluconate Cloth  6 each Topical Daily   cholecalciferol  5,000 Units Oral q AM   empagliflozin  10 mg Oral Daily   multivitamin with minerals  1 tablet Oral Daily   pantoprazole  40 mg Oral Daily   potassium chloride SA  40 mEq Oral Daily   torsemide  40 mg Oral BID   venlafaxine XR  150 mg Oral Q breakfast   zolpidem  5 mg Oral QHS   Continuous Infusions:  PRN Meds: acetaminophen, lip balm, nitroGLYCERIN, ondansetron (ZOFRAN) IV   Vital Signs    Vitals:   11/01/21 0300 11/01/21 0400 11/01/21 0500 11/01/21 0600  BP: 99/62 (!) 86/61 (!) 85/57 (!) 96/51  Pulse: (!) 104 100 96 (!) 108  Resp: (!) 23 19 16  (!) 25  Temp:      TempSrc:      SpO2: 96% 98% 98% 100%  Weight:      Height:        Intake/Output Summary (Last 24 hours) at 11/01/2021 0719 Last data filed at 10/30/2021 1700 Gross per 24 hour  Intake 723.12 ml  Output --  Net 723.12 ml   Last 3 Weights 10/11/2021 10/30/2021 10/28/2021  Weight (lbs) 190 lb 4.1 oz 192 lb 7.4 oz 197 lb 5 oz  Weight (kg) 86.3 kg 87.3 kg 89.5 kg      Telemetry    Atrial fibrillation with intermittent ventricular pacing; HR 105 - Personally Reviewed   Physical Exam   GEN: No acute distress.   Neck: No JVD Cardiac: irregular Respiratory: Clear to auscultation bilaterally. GI: Soft, nontender, non-distended  MS: No edema Neuro:  Nonfocal  Psych: Normal affect   Labs    High Sensitivity Troponin:   Recent Labs  Lab 10/28/21 0000  TROPONINIHS 11     Chemistry Recent Labs  Lab 10/08/2021 2138 10/28/21 0821 10/30/21 0400 10/30/21 1010 10/18/2021 0118 11/01/21 0506  NA 131*   < > 136  134* 141 138  K 3.1*   < > 2.4* 3.8 2.7* 4.1  CL 94*   < > 100 99 106 105  CO2 24   < > 28 26 26 25   GLUCOSE 127*   < > 92 132* 108* 104*  BUN 47*   < > 18 17 15 14   CREATININE 2.78*   < > 1.45* 1.51* 1.49* 1.50*  CALCIUM 8.4*   < > 8.2* 8.2* 8.6* 9.2  MG  --   --  1.7  --  2.1 2.2  PROT 5.9*  --   --   --  5.5* 5.4*  ALBUMIN 2.6*  --   --   --  2.3* 2.3*  AST 33  --   --   --  36 32  ALT 25  --   --   --  26 23  ALKPHOS 111  --   --   --  110 105  BILITOT 0.9  --   --   --  0.6 0.5  GFRNONAA 18*   < > 40* 38* 38* 38*  ANIONGAP 13   < >  8 9 9 8    < > = values in this interval not displayed.    Lipids  Recent Labs  Lab 10/28/21 0821  CHOL 130  TRIG 153*  HDL 27*  LDLCALC 72  CHOLHDL 4.8    Hematology Recent Labs  Lab 10/30/21 0400 10/18/2021 0118 11/01/21 0506  WBC 7.1 7.4 6.6  RBC 2.79* 3.05* 2.97*  HGB 8.0* 8.7* 8.7*  HCT 26.9* 29.4* 29.6*  MCV 96.4 96.4 99.7  MCH 28.7 28.5 29.3  MCHC 29.7* 29.6* 29.4*  RDW 18.9* 19.4* 19.6*  PLT 329 372 371    BNP Recent Labs  Lab 10/08/2021 2138  BNP 477.9*    Patient Profile     66 y.o. female with paroxysmal atrial fibrillation status post failed ablation, status post Watchman December 2022, breast cancer status post Adriamycin therapy, nonischemic cardiomyopathy with ejection fraction 25 to 30% (on TEE improved), status post BiV Medtronic ICD, hypertension, obstructive sleep apnea, and chronic kidney disease here with lower GI bleeding and pericardial effusion.  Last echocardiogram October 28, 2021 showed ejection fraction 35 to 60%, grade 2 diastolic dysfunction, severe right ventricular enlargement, severe right atrial enlargement, mild left atrial enlargement, moderate pericardial effusion with no evidence of tamponade, moderate mitral regurgitation, flail posterior tricuspid valve leaflet with severe TR.  Colonoscopy showed polyps which were removed and colitis likely due to hypotension, diverticulosis.  Assessment &  Plan    1 severe tricuspid regurgitation-with RV failure.  Plan is for tricuspid valve repair/replacement on Tuesday.  2 pericardial fusion-moderate on most recent echocardiogram with no evidence of tamponade.  3 chronic systolic congestive heart failure-ejection fraction now 35 to 40% with history of CRT-D.  Patient has been hypotensive and does not appear to be volume overloaded on exam.  We will continue to hold diuretics today.  Potentially resume previous dose of Demadex tomorrow.  Continue Jardiance at present dose.  4 persistent atrial fibrillation-patient is status post watchman implant August 21, 2021.  Continue amiodarone at present dose.  Possible Maze procedure at time of tricuspid valve repair.  5 GI bleed-as outlined above colonoscopy revealed polyps which were removed and colitis likely due to low output.  Hemoglobin is unchanged today.  We will continue to follow.  Continue Protonix.  6 acute kidney injury-creatinine is unchanged today.  We will continue to follow.  For questions or updates, please contact [place] Please consult [email or web address] for contact info under        Signed, Kirk Ruths, MD  11/01/2021, 7:19 AM

## 2021-11-01 NOTE — Plan of Care (Signed)

## 2021-11-02 ENCOUNTER — Encounter (HOSPITAL_COMMUNITY): Payer: Self-pay | Admitting: Gastroenterology

## 2021-11-02 DIAGNOSIS — I3139 Other pericardial effusion (noninflammatory): Secondary | ICD-10-CM | POA: Diagnosis not present

## 2021-11-02 LAB — CBC
HCT: 30.3 % — ABNORMAL LOW (ref 36.0–46.0)
Hemoglobin: 8.7 g/dL — ABNORMAL LOW (ref 12.0–15.0)
MCH: 28.2 pg (ref 26.0–34.0)
MCHC: 28.7 g/dL — ABNORMAL LOW (ref 30.0–36.0)
MCV: 98.4 fL (ref 80.0–100.0)
Platelets: 431 10*3/uL — ABNORMAL HIGH (ref 150–400)
RBC: 3.08 MIL/uL — ABNORMAL LOW (ref 3.87–5.11)
RDW: 19.2 % — ABNORMAL HIGH (ref 11.5–15.5)
WBC: 8.3 10*3/uL (ref 4.0–10.5)
nRBC: 0.4 % — ABNORMAL HIGH (ref 0.0–0.2)

## 2021-11-02 LAB — CULTURE, BLOOD (ROUTINE X 2)
Culture: NO GROWTH
Culture: NO GROWTH
Special Requests: ADEQUATE

## 2021-11-02 LAB — COMPREHENSIVE METABOLIC PANEL
ALT: 23 U/L (ref 0–44)
AST: 31 U/L (ref 15–41)
Albumin: 2.4 g/dL — ABNORMAL LOW (ref 3.5–5.0)
Alkaline Phosphatase: 106 U/L (ref 38–126)
Anion gap: 8 (ref 5–15)
BUN: 16 mg/dL (ref 8–23)
CO2: 26 mmol/L (ref 22–32)
Calcium: 9.1 mg/dL (ref 8.9–10.3)
Chloride: 102 mmol/L (ref 98–111)
Creatinine, Ser: 1.53 mg/dL — ABNORMAL HIGH (ref 0.44–1.00)
GFR, Estimated: 37 mL/min — ABNORMAL LOW (ref >60)
Glucose, Bld: 109 mg/dL — ABNORMAL HIGH (ref 70–99)
Potassium: 3.9 mmol/L (ref 3.5–5.1)
Sodium: 136 mmol/L (ref 135–145)
Total Bilirubin: 0.4 mg/dL (ref 0.3–1.2)
Total Protein: 5.6 g/dL — ABNORMAL LOW (ref 6.5–8.1)

## 2021-11-02 LAB — MAGNESIUM: Magnesium: 2.1 mg/dL (ref 1.7–2.4)

## 2021-11-02 MED ORDER — POTASSIUM CHLORIDE CRYS ER 20 MEQ PO TBCR
20.0000 meq | EXTENDED_RELEASE_TABLET | Freq: Once | ORAL | Status: AC
  Administered 2021-11-02: 20 meq via ORAL
  Filled 2021-11-02: qty 1

## 2021-11-02 NOTE — Plan of Care (Signed)
°  Problem: Education: Goal: Knowledge of General Education information will improve Description: Including pain rating scale, medication(s)/side effects and non-pharmacologic comfort measures 11/02/2021 0440 by Marcie Mowers, RN Outcome: Progressing 11/02/2021 0439 by Marcie Mowers, RN Outcome: Progressing 11/01/2021 2058 by Marcie Mowers, RN Outcome: Progressing   Problem: Health Behavior/Discharge Planning: Goal: Ability to manage health-related needs will improve 11/02/2021 0440 by Marcie Mowers, RN Outcome: Progressing 11/02/2021 0439 by Marcie Mowers, RN Outcome: Progressing 11/01/2021 2058 by Marcie Mowers, RN Outcome: Progressing   Problem: Clinical Measurements: Goal: Ability to maintain clinical measurements within normal limits will improve 11/02/2021 0440 by Marcie Mowers, RN Outcome: Progressing 11/02/2021 0439 by Marcie Mowers, RN Outcome: Progressing 11/01/2021 2058 by Marcie Mowers, RN Outcome: Progressing Goal: Will remain free from infection 11/02/2021 0440 by Marcie Mowers, RN Outcome: Progressing 11/01/2021 2058 by Marcie Mowers, RN Outcome: Progressing Goal: Diagnostic test results will improve Outcome: Progressing Goal: Respiratory complications will improve 11/02/2021 0440 by Marcie Mowers, RN Outcome: Progressing 11/01/2021 2058 by Marcie Mowers, RN Outcome: Progressing Goal: Cardiovascular complication will be avoided 11/02/2021 0440 by Marcie Mowers, RN Outcome: Progressing 11/01/2021 2058 by Marcie Mowers, RN Outcome: Progressing   Problem: Activity: Goal: Risk for activity intolerance will decrease 11/02/2021 0440 by Marcie Mowers, RN Outcome: Progressing 11/01/2021 2058 by Marcie Mowers, RN Outcome: Progressing   Problem: Nutrition: Goal: Adequate nutrition will be maintained Outcome: Progressing   Problem: Coping: Goal: Level of anxiety will decrease Outcome: Progressing   Problem: Elimination: Goal: Will not experience  complications related to bowel motility Outcome: Progressing Goal: Will not experience complications related to urinary retention Outcome: Progressing   Problem: Pain Managment: Goal: General experience of comfort will improve Outcome: Progressing   Problem: Safety: Goal: Ability to remain free from injury will improve Outcome: Progressing   Problem: Skin Integrity: Goal: Risk for impaired skin integrity will decrease Outcome: Progressing

## 2021-11-02 NOTE — Progress Notes (Signed)
Advanced Heart Failure Rounding Note  PCP-Cardiologist: None   Subjective:   02/20: Transferred to [facility] for GI bleed + pericardial effusion 02/22: R/LHC with normal cors, equalized R and L sided pressures with no evidence of LV-RV interaction, RA 17, PA 33/21 (25), [place]ick CO/CI 4.2/2.2, severely reduced PAPi (in setting of severe TR), PA sat 47%, 48% 2/24 S/P Two polyps in the distal ascending colon, removed with a cold snare. Resected and retrieved. Right colonic ischemic ulcers (healing, no active bleeding, biopsied)-likely etiology of LGI Bleed. Moderate sigmoid diverticulosis.   Feels great today. Walking around room. Denies SOB, orthopnea or PND. Back on oral torsemide.   Eating well. No ab pain or recurrent BRBPR.     Objective:   Weight Range: 86.3 kg Body mass index is 34.8 kg/m.   Vital Signs:   Temp:  [97.6 F (36.4 C)-97.9 F (36.6 C)] 97.6 F (36.4 C) (02/26 1118) Pulse Rate:  [92-107] 102 (02/26 1118) Resp:  [16-26] 18 (02/26 1118) BP: (96-111)/(49-77) 107/77 (02/26 1118) SpO2:  [92 %-98 %] 94 % (02/26 1118) Last BM Date : 10/26/2021  Weight change: Filed Weights   10/20/2021 0600 10/30/21 0600 10/08/2021 0600  Weight: 89.5 kg 87.3 kg 86.3 kg    Intake/Output:   Intake/Output Summary (Last 24 hours) at 11/02/2021 1320 Last data filed at 11/01/2021 1400 Gross per 24 hour  Intake 120 ml  Output 1100 ml  Net -980 ml       Physical Exam   General:  Sitting up in bed No resp difficulty HEENT: normal Neck: supple. RIJ TLC Carotids 2+ bilat; no bruits. No lymphadenopathy or thryomegaly appreciated. Cor: PMI nondisplaced. Regular mildly tachy 2/6 TR. Lungs: clear Abdomen: soft, nontender, nondistended. No hepatosplenomegaly. No bruits or masses. Good bowel sounds. Extremities: no cyanosis, clubbing, rash, edema Neuro: alert & orientedx3, cranial nerves grossly intact. moves all 4 extremities w/o difficulty. Affect pleasant    Telemetry   ST 100-110  Personally reviewed    Labs    CBC Recent Labs    11/01/21 0506 11/02/21 0144  WBC 6.6 8.3  HGB 8.7* 8.7*  HCT 29.6* 30.3*  MCV 99.7 98.4  PLT 371 431*    Basic Metabolic Panel Recent Labs    11/01/21 0506 11/02/21 0144  NA 138 136  K 4.1 3.9  CL 105 102  CO2 25 26  GLUCOSE 104* 109*  BUN 14 16  CREATININE 1.50* 1.53*  CALCIUM 9.2 9.1  MG 2.2 2.1    Liver Function Tests Recent Labs    11/01/21 0506 11/02/21 0144  AST 32 31  ALT 23 23  ALKPHOS 105 106  BILITOT 0.5 0.4  PROT 5.4* 5.6*  ALBUMIN 2.3* 2.4*    No results for input(s): LIPASE, AMYLASE in the last 72 hours. Cardiac Enzymes No results for input(s): CKTOTAL, CKMB, CKMBINDEX, TROPONINI in the last 72 hours.  BNP: BNP (last 3 results) Recent Labs    02/24/21 1357 07/16/21 1145 10/22/2021 2138  BNP 326.4* 523.7* 477.9*     ProBNP (last 3 results) No results for input(s): PROBNP in the last 8760 hours.   D-Dimer No results for input(s): DDIMER in the last 72 hours. Hemoglobin A1C No results for input(s): HGBA1C in the last 72 hours. Fasting Lipid Panel No results for input(s): CHOL, HDL, LDLCALC, TRIG, CHOLHDL, LDLDIRECT in the last 72 hours.  Thyroid Function Tests No results for input(s): TSH, T4TOTAL, T3FREE, THYROIDAB in the last 72 hours.  Invalid input(s): FREET3  Other results:   Imaging    No results found.   Medications:     Scheduled Medications:  allopurinol  100 mg Oral Q1500   amiodarone  200 mg Oral Daily   atorvastatin  10 mg Oral q AM   buPROPion  150 mg Oral BID   Chlorhexidine Gluconate Cloth  6 each Topical Daily   cholecalciferol  5,000 Units Oral q AM   empagliflozin  10 mg Oral Daily   ferrous [id number] C-folic acid  1 capsule Oral BID PC   lactose free nutrition  237 mL Oral TID WC   multivitamin with minerals  1 tablet Oral Daily   pantoprazole  40 mg Oral Daily   torsemide  60 mg Oral BID   venlafaxine XR  150 mg Oral Q  breakfast   zolpidem  5 mg Oral QHS    Infusions:    PRN Medications: acetaminophen, hydrOXYzine, lip balm, nitroGLYCERIN, ondansetron (ZOFRAN) IV   Assessment/Plan   Pericardial effusion: -Doubt from Watchman procedure in 12/22 -Effusion appears moderate in size on echo 02/21.  -[place] 02/22 with equalization of right and left-sided pressures with no evidence of LV-RV interaction -There does not appear to be any evidence of tamponade. Suspect main issue is severe TR with markedly elevated RA pressures and inability to drain pericardium.  2. Chronic systolic CHF: - felt to have LBBB CM vs chemo induced (got adriamycin). 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 No effusion -Echo 02/21: EF 35-40%, RV severely enlarged with okay function, moderate MR, severe TR with flail posterior leaflet -RHC 02/22: RA 17, PA 33/21 (25), PCWP 18, PAPi 0.6 (in setting of severe TR), PA sat 47%, 48% - Marginal PA sat on RHC. Monitor for signs of low-output - Volume status stable/improving back on po torsemide - Home spiro and bisoprolol held d/t hypotension - Continue jardiance 10 mg daily  3. Severe TR - with RV failure - TCTS planning for TVR + possible MAZE.   4. PAF/tachycardia - Unable to complete ablation 10/22 d/t pericardial effusion - S/p Watchman implant 08/21/21 - On amiodarone 200 mg daily - Would not add beta blocker or other rate rate controlling medications in setting of RV failure  5. 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 GI bleeding - BRBPR + melena 4-5 days prior to admission - Per her report CT at OSH with diverticulosis but no diverticulitis - Denies NSAID use - 1 u PRBCs this admit - Hgb stable at  8.7 today  - EGD/Colonoscopy- R colonic ischemic ulcers - suspect in the setting of low output hf., 2 polyps removed, diverticulosis.  - On protonix.   6. OSA - Unable to tolerate CPAP  7. Hypokalemia - improve  8. Ischemic colitis - suspect related to mesenteric ischemia in setting of low arterial pressure and high venous outflow pressures from RV failure - No h/o ischemic colitis symptoms - Has CT abdomen from OSH (non contrast) no documentation of ab arterial calcification - D/w VVS. Could get contrast CT abdomen to further evaluate for mesenteric disease but suspicion low given lack of RFs and/or previous symptoms. With Scr 1.5, I think the risk of CT outweighs benefit.  Length of Stay: Johnson, MD  11/02/2021, 1:20 PM  Advanced Heart Failure Team Pager [phone] (M-F; 7a - 5p)  Please contact [facility] for night-coverage after hours (5p -7a ) and weekends on [email or web address]

## 2021-11-03 ENCOUNTER — Encounter: Payer: Self-pay | Admitting: Gastroenterology

## 2021-11-03 ENCOUNTER — Inpatient Hospital Stay (HOSPITAL_COMMUNITY): Payer: Medicare PPO

## 2021-11-03 DIAGNOSIS — I071 Rheumatic tricuspid insufficiency: Secondary | ICD-10-CM

## 2021-11-03 DIAGNOSIS — I4819 Other persistent atrial fibrillation: Secondary | ICD-10-CM | POA: Diagnosis not present

## 2021-11-03 DIAGNOSIS — I4891 Unspecified atrial fibrillation: Secondary | ICD-10-CM | POA: Diagnosis not present

## 2021-11-03 DIAGNOSIS — I361 Nonrheumatic tricuspid (valve) insufficiency: Secondary | ICD-10-CM | POA: Diagnosis not present

## 2021-11-03 DIAGNOSIS — I3139 Other pericardial effusion (noninflammatory): Secondary | ICD-10-CM | POA: Diagnosis not present

## 2021-11-03 DIAGNOSIS — Z01810 Encounter for preprocedural cardiovascular examination: Secondary | ICD-10-CM

## 2021-11-03 LAB — SURGICAL PATHOLOGY

## 2021-11-03 LAB — CBC
HCT: 30.1 % — ABNORMAL LOW (ref 36.0–46.0)
Hemoglobin: 9 g/dL — ABNORMAL LOW (ref 12.0–15.0)
MCH: 29 pg (ref 26.0–34.0)
MCHC: 29.9 g/dL — ABNORMAL LOW (ref 30.0–36.0)
MCV: 97.1 fL (ref 80.0–100.0)
Platelets: 430 10*3/uL — ABNORMAL HIGH (ref 150–400)
RBC: 3.1 MIL/uL — ABNORMAL LOW (ref 3.87–5.11)
RDW: 18.7 % — ABNORMAL HIGH (ref 11.5–15.5)
WBC: 8.2 10*3/uL (ref 4.0–10.5)
nRBC: 0 % (ref 0.0–0.2)

## 2021-11-03 LAB — PROTIME-INR
INR: 1.2 (ref 0.8–1.2)
Prothrombin Time: 14.7 seconds (ref 11.4–15.2)

## 2021-11-03 LAB — COMPREHENSIVE METABOLIC PANEL
ALT: 23 U/L (ref 0–44)
AST: 31 U/L (ref 15–41)
Albumin: 2.5 g/dL — ABNORMAL LOW (ref 3.5–5.0)
Alkaline Phosphatase: 107 U/L (ref 38–126)
Anion gap: 9 (ref 5–15)
BUN: 19 mg/dL (ref 8–23)
CO2: 31 mmol/L (ref 22–32)
Calcium: 8.7 mg/dL — ABNORMAL LOW (ref 8.9–10.3)
Chloride: 99 mmol/L (ref 98–111)
Creatinine, Ser: 1.66 mg/dL — ABNORMAL HIGH (ref 0.44–1.00)
GFR, Estimated: 34 mL/min — ABNORMAL LOW (ref >60)
Glucose, Bld: 92 mg/dL (ref 70–99)
Potassium: 3.2 mmol/L — ABNORMAL LOW (ref 3.5–5.1)
Sodium: 139 mmol/L (ref 135–145)
Total Bilirubin: 0.2 mg/dL — ABNORMAL LOW (ref 0.3–1.2)
Total Protein: 5.7 g/dL — ABNORMAL LOW (ref 6.5–8.1)

## 2021-11-03 LAB — MAGNESIUM: Magnesium: 1.8 mg/dL (ref 1.7–2.4)

## 2021-11-03 LAB — PREPARE RBC (CROSSMATCH)

## 2021-11-03 MED ORDER — NITROGLYCERIN IN D5W 200-5 MCG/ML-% IV SOLN
2.0000 ug/min | INTRAVENOUS | Status: DC
  Filled 2021-11-03: qty 250

## 2021-11-03 MED ORDER — POTASSIUM CHLORIDE 2 MEQ/ML IV SOLN
80.0000 meq | INTRAVENOUS | Status: DC
  Filled 2021-11-03: qty 40

## 2021-11-03 MED ORDER — MAGNESIUM SULFATE 50 % IJ SOLN
40.0000 meq | INTRAMUSCULAR | Status: DC
  Filled 2021-11-03: qty 9.85

## 2021-11-03 MED ORDER — DEXMEDETOMIDINE HCL IN NACL 400 MCG/100ML IV SOLN
0.1000 ug/kg/h | INTRAVENOUS | Status: AC
  Administered 2021-11-04: .5 ug/kg/h via INTRAVENOUS
  Filled 2021-11-03: qty 100

## 2021-11-03 MED ORDER — MILRINONE LACTATE IN DEXTROSE 20-5 MG/100ML-% IV SOLN
0.3000 ug/kg/min | INTRAVENOUS | Status: AC
  Administered 2021-11-04: .25 ug/kg/min via INTRAVENOUS
  Filled 2021-11-03: qty 100

## 2021-11-03 MED ORDER — CHLORHEXIDINE GLUCONATE 4 % EX LIQD
60.0000 mL | Freq: Once | CUTANEOUS | Status: AC
  Administered 2021-11-03: 4 via TOPICAL
  Filled 2021-11-03: qty 60

## 2021-11-03 MED ORDER — TRANEXAMIC ACID (OHS) BOLUS VIA INFUSION
15.0000 mg/kg | INTRAVENOUS | Status: AC
  Administered 2021-11-04: 751.5 mg via INTRAVENOUS
  Filled 2021-11-03: qty 752

## 2021-11-03 MED ORDER — METOPROLOL TARTRATE 12.5 MG HALF TABLET
12.5000 mg | ORAL_TABLET | Freq: Once | ORAL | Status: AC
  Administered 2021-11-04: 12.5 mg via ORAL
  Filled 2021-11-03: qty 1

## 2021-11-03 MED ORDER — POTASSIUM CHLORIDE CRYS ER 20 MEQ PO TBCR: 40.0000 meq | EXTENDED_RELEASE_TABLET | ORAL | Status: DC

## 2021-11-03 MED ORDER — VANCOMYCIN HCL 1500 MG/300ML IV SOLN
1500.0000 mg | INTRAVENOUS | Status: AC
  Administered 2021-11-04: 1500 mg via INTRAVENOUS
  Filled 2021-11-03: qty 300

## 2021-11-03 MED ORDER — INSULIN REGULAR(HUMAN) IN NACL 100-0.9 UT/100ML-% IV SOLN
INTRAVENOUS | Status: AC
  Administered 2021-11-04: 1.8 [IU]/h via INTRAVENOUS
  Filled 2021-11-03: qty 100

## 2021-11-03 MED ORDER — TRANEXAMIC ACID 1000 MG/10ML IV SOLN
1.5000 mg/kg/h | INTRAVENOUS | Status: AC
  Administered 2021-11-04: 1.5 mg/kg/h via INTRAVENOUS
  Filled 2021-11-03: qty 25

## 2021-11-03 MED ORDER — CHLORHEXIDINE GLUCONATE 0.12 % MT SOLN
15.0000 mL | Freq: Once | OROMUCOSAL | Status: AC
  Administered 2021-11-04: 15 mL via OROMUCOSAL
  Filled 2021-11-03: qty 15

## 2021-11-03 MED ORDER — TORSEMIDE 20 MG PO TABS
60.0000 mg | ORAL_TABLET | Freq: Every day | ORAL | Status: DC
  Administered 2021-11-03: 60 mg via ORAL
  Filled 2021-11-03: qty 3

## 2021-11-03 MED ORDER — BISACODYL 5 MG PO TBEC
5.0000 mg | DELAYED_RELEASE_TABLET | Freq: Once | ORAL | Status: DC
  Filled 2021-11-03 (×2): qty 1

## 2021-11-03 MED ORDER — MAGNESIUM SULFATE 2 GM/50ML IV SOLN
2.0000 g | Freq: Once | INTRAVENOUS | Status: AC
  Administered 2021-11-03: 2 g via INTRAVENOUS
  Filled 2021-11-03: qty 50

## 2021-11-03 MED ORDER — PHENYLEPHRINE HCL-NACL 20-0.9 MG/250ML-% IV SOLN
30.0000 ug/min | INTRAVENOUS | Status: AC
  Administered 2021-11-04: 25 ug/min via INTRAVENOUS
  Filled 2021-11-03: qty 250

## 2021-11-03 MED ORDER — TRANEXAMIC ACID (OHS) PUMP PRIME SOLUTION
2.0000 mg/kg | INTRAVENOUS | Status: DC
  Filled 2021-11-03: qty 1.73

## 2021-11-03 MED ORDER — DIAZEPAM 2 MG PO TABS
2.0000 mg | ORAL_TABLET | Freq: Once | ORAL | Status: AC
  Administered 2021-11-04: 2 mg via ORAL
  Filled 2021-11-03: qty 1

## 2021-11-03 MED ORDER — EPINEPHRINE HCL 5 MG/250ML IV SOLN IN NS
0.0000 ug/min | INTRAVENOUS | Status: AC
  Administered 2021-11-04: 2 ug/min via INTRAVENOUS
  Filled 2021-11-03: qty 250

## 2021-11-03 MED ORDER — HEPARIN 30,000 UNITS/1000 ML (OHS) CELLSAVER SOLUTION
Status: DC
  Filled 2021-11-03: qty 1000

## 2021-11-03 MED ORDER — CEFAZOLIN SODIUM-DEXTROSE 2-4 GM/100ML-% IV SOLN
2.0000 g | INTRAVENOUS | Status: AC
  Administered 2021-11-04: 2 g via INTRAVENOUS
  Filled 2021-11-03: qty 100

## 2021-11-03 MED ORDER — PLASMA-LYTE A IV SOLN
INTRAVENOUS | Status: DC
  Filled 2021-11-03: qty 2.5

## 2021-11-03 MED ORDER — TEMAZEPAM 15 MG PO CAPS
15.0000 mg | ORAL_CAPSULE | Freq: Once | ORAL | Status: AC | PRN
  Administered 2021-11-03: 15 mg via ORAL
  Filled 2021-11-03: qty 1

## 2021-11-03 MED ORDER — CHLORHEXIDINE GLUCONATE 4 % EX LIQD
60.0000 mL | Freq: Once | CUTANEOUS | Status: AC
  Administered 2021-11-04: 4 via TOPICAL
  Filled 2021-11-03 (×2): qty 60

## 2021-11-03 MED ORDER — NOREPINEPHRINE 4 MG/250ML-% IV SOLN
0.0000 ug/min | INTRAVENOUS | Status: AC
  Administered 2021-11-04: 2 ug/min via INTRAVENOUS
  Filled 2021-11-03: qty 250

## 2021-11-03 MED ORDER — POTASSIUM CHLORIDE CRYS ER 20 MEQ PO TBCR
40.0000 meq | EXTENDED_RELEASE_TABLET | ORAL | Status: AC
  Administered 2021-11-03 (×2): 40 meq via ORAL
  Filled 2021-11-03 (×2): qty 2

## 2021-11-03 NOTE — Progress Notes (Signed)
Pre op TRV has been completed.   Preliminary results in CV Proc.   Jinny Blossom Tricha Ruggirello 11/03/2021 10:39 AM

## 2021-11-03 NOTE — Anesthesia Preprocedure Evaluation (Addendum)
Anesthesia Evaluation  Patient identified by MRN, date of birth, ID band Patient awake    Reviewed: Allergy & Precautions, NPO status , Patient's Chart, lab work & pertinent test results  Airway Mallampati: II  TM Distance: >3 FB Neck ROM: Full    Dental no notable dental hx.    Pulmonary sleep apnea ,    Pulmonary exam normal        Cardiovascular hypertension, Pt. on medications and Pt. on home beta blockers +CHF  + dysrhythmias (failed ablation s/p Watchman 12/22) Atrial Fibrillation + Cardiac Defibrillator (implant 2007) + Valvular Problems/Murmurs (TR) MR  Rhythm:Regular Rate:Normal + Diastolic murmurs    Neuro/Psych negative neurological ROS  negative psych ROS   GI/Hepatic Neg liver ROS, GERD  Medicated,  Endo/Other  negative endocrine ROS  Renal/GU CRFRenal disease  negative genitourinary   Musculoskeletal Breast Ca   Abdominal Normal abdominal exam  (+)   Peds  Hematology negative hematology ROS (+)   Anesthesia Other Findings   Reproductive/Obstetrics                            Anesthesia Physical Anesthesia Plan  ASA: 4  Anesthesia Plan: General   Post-op Pain Management:    Induction: Intravenous  PONV Risk Score and Plan: 3 and Midazolam and Treatment may vary due to age or medical condition  Airway Management Planned: Mask and Oral ETT  Additional Equipment: Arterial line, CVP, TEE, 3D TEE and Ultrasound Guidance Line Placement  Intra-op Plan:   Post-operative Plan: Post-operative intubation/ventilation  Informed Consent: I have reviewed the patients History and Physical, chart, labs and discussed the procedure including the risks, benefits and alternatives for the proposed anesthesia with the patient or authorized representative who has indicated his/her understanding and acceptance.     Dental advisory given  Plan Discussed with: CRNA  Anesthesia Plan  Comments: (Lab Results      Component                Value               Date                      WBC                      8.3                 11/02/2021                HGB                      8.7 (L)             11/02/2021                HCT                      30.3 (L)            11/02/2021                MCV                      98.4                11/02/2021  PLT                      431 (H)             11/02/2021           Lab Results      Component                Value               Date                      NA                       139                 11/03/2021                K                        3.2 (L)             11/03/2021                CO2                      31                  11/03/2021                GLUCOSE                  92                  11/03/2021                BUN                      19                  11/03/2021                CREATININE               1.66 (H)            11/03/2021                CALCIUM                  8.7 (L)             11/03/2021                EGFR                     18 (L)              09/22/2021                GFRNONAA                 34 (L)              11/03/2021            TTE 02/23: IMPRESSIONS  1. Left ventricular ejection fraction, by estimation, is 35 to 40%. The  left ventricle has moderately decreased function. The left ventricle  demonstrates global hypokinesis. Left  ventricular diastolic parameters are  consistent with Grade II diastolic  dysfunction (pseudonormalization). Elevated left atrial pressure. There is  the interventricular septum is flattened in diastole ('D' shaped left  ventricle), consistent with right ventricular volume overload.  2. Right ventricular systolic function is low normal. The right  ventricular size is severely enlarged. A is visualized in the coronary  sinus. There is normal pulmonary artery systolic pressure.  3. Left atrial size was mildly dilated.  4. Right  atrial size was severely dilated.  5. Moderate pericardial effusion. The pericardial effusion is  circumferential. There is no evidence of cardiac tamponade.  6. The mitral valve is myxomatous. Moderate mitral valve regurgitation.  7. Flail posterior tricuspid valve leaflet. The tricuspid valve is  abnormal. Tricuspid valve regurgitation is severe.  8. The aortic valve is tricuspid. Aortic valve regurgitation is not  visualized. Aortic valve sclerosis is present, with no evidence of aortic  valve stenosis.  9. The inferior vena cava is dilated in size with <50% respiratory  variability, suggesting right atrial pressure of 15 mmHg.   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 This may allow  equalization of atrial pressures and mask  respiratory flow variation as a sign of tamponade. Right heart volume  overload may reduce the occurence of right heart chamber collapse. Serial  studies are recommended.  Cath 2/23: 1. Normal coronary arteries 2. EF ~40% 3. Equalized right and left-sided pressure with no evidence of LV-RV interaction 4. Severely reduced PAPi 5. SBP 25 points higher with invasive measurement versus non-invasive. Will leave arterial sheath in overnight to help wean NE  TEE 01/23: IMPRESSIONS  1. Left ventricular ejection fraction, by estimation, is 45 to 50%. The  left ventricle has mildly decreased function. The left ventricle  demonstrates global hypokinesis. There is the interventricular septum is  flattened in diastole ('D' shaped left  ventricle), consistent with right ventricular volume overload.  2. Right ventricular systolic function is mildly reduced. The right  ventricular size is severely enlarged. There is normal pulmonary artery  systolic pressure.  3. Well-seated Watchman device without leak or  thrombus. Left atrial size  was mildly dilated. No left atrial/left atrial appendage thrombus was  detected.  4. Right atrial size was severely dilated.  5. The mitral valve is normal in structure. Mild to moderate mitral valve  regurgitation.  6. Mobile structures attached to the septal and anterior leaflets of the  tricuspid valve are probably flail sements of the leaflets due to chordal  rupture.. The tricuspid valve is abnormal. Tricuspid valve regurgitation  is severe.  7. The aortic valve is tricuspid. Aortic valve regurgitation is not  visualized. No aortic stenosis is present.  8. Evidence of atrial level shunting detected by color flow Doppler.  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  )     Anesthesia Quick Evaluation

## 2021-11-03 NOTE — Progress Notes (Signed)
3 Days Post-Op Procedure(s) (LRB): ESOPHAGOGASTRODUODENOSCOPY (EGD) WITH PROPOFOL (N/A) COLONOSCOPY WITH PROPOFOL (N/A) BIOPSY POLYPECTOMY Subjective: Patient examined and today's chest x-ray image personally reviewed She is feeling well today and ready for surgery tomorrow. No symptoms of abdominal pain or blood per rectum Rate controlled atrial fibrillation with paced rhythm  Objective: Vital signs in last 24 hours: Temp:  [97.6 F (36.4 C)-98.1 F (36.7 C)] 98.1 F (36.7 C) (02/27 1136) Pulse Rate:  [92-109] 109 (02/27 1136) Cardiac Rhythm: Ventricular paced (02/27 0703) Resp:  [18-20] 19 (02/27 1136) BP: (92-120)/(57-59) 120/59 (02/27 1136) SpO2:  [95 %-99 %] 96 % (02/27 1136)  Hemodynamic parameters for last 24 hours:    Intake/Output from previous day: 02/26 0701 - 02/27 0700 In: 840 [P.O.:840] Out: 900 [Urine:900] Intake/Output this shift: No intake/output data recorded.  Exam Lungs clear Heart rate paced rhythm regular no murmur Minimal ankle edema Neuro intact Abdomen soft nontender nondistended  Lab Results: Recent Labs    11/02/21 0144 11/03/21 0948  WBC 8.3 8.2  HGB 8.7* 9.0*  HCT 30.3* 30.1*  PLT 431* 430*   BMET:  Recent Labs    11/02/21 0144 11/03/21 0044  NA 136 139  K 3.9 3.2*  CL 102 99  CO2 26 31  GLUCOSE 109* 92  BUN 16 19  CREATININE 1.53* 1.66*  CALCIUM 9.1 8.7*    PT/INR:  Recent Labs    11/03/21 0044  LABPROT 14.7  INR 1.2   ABG    Component Value Date/Time   PHART 7.421 10/16/2021 1240   HCO3 24.6 10/24/2021 1251   TCO2 26 10/13/2021 1251   ACIDBASEDEF 1.0 10/13/2021 1250   O2SAT 47 10/22/2021 1251   CBG (last 3)  No results for input(s): GLUCAP in the last 72 hours.  Assessment/Plan: S/P Procedure(s) (LRB): ESOPHAGOGASTRODUODENOSCOPY (EGD) WITH PROPOFOL (N/A) COLONOSCOPY WITH PROPOFOL (N/A) BIOPSY POLYPECTOMY Nonischemic cardiomyopathy with biventricular dysfunction Severe tricuspid regurgitation with  symptomatic and hemodynamically significant RV dysfunction. Plan tricuspid valve repair/replacement and possible pulmonary vein isolation maze ablation tomorrow.  Patient understands the details of the operation as well as the expected benefits and potential risks.  She agrees to proceed with surgery and understands the possibility of needing blood transfusion.   LOS: 7 days    Dahlia Byes 11/03/2021

## 2021-11-03 NOTE — Progress Notes (Addendum)
Advanced Heart Failure Rounding Note  PCP-Cardiologist: None   Subjective:   02/20: Transferred to [facility] for GI bleed + pericardial effusion 02/22: R/LHC with normal cors, equalized R and L sided pressures with no evidence of LV-RV interaction, RA 17, PA 33/21 (25), Fick CO/CI 4.2/2.2, severely reduced PAPi (in setting of severe TR), PA sat 47%, 48% 2/24 S/P Two polyps in the distal ascending colon, removed with a cold snare. Resected and retrieved. Right colonic ischemic ulcers (healing, no active bleeding, biopsied)-likely etiology of LGI Bleed. Moderate sigmoid diverticulosis. 2/25 Diuresed with IV lasix.  2/26 Switched to torsemide 60 /60 .    Creatinine trending up a little. 1.5>1.7   No complaints. Denies SOB. Denies dizziness.     Objective:   Weight Range: 86.3 kg Body mass index is 34.8 kg/m.   Vital Signs:   Temp:  [97.6 F (36.4 C)-97.9 F (36.6 C)] 97.7 F (36.5 C) (02/26 2334) Pulse Rate:  [92-102] 100 (02/27 0400) Resp:  [18-20] 20 (02/26 2334) BP: (96-108)/(57-77) 108/59 (02/26 2334) SpO2:  [94 %-99 %] 95 % (02/27 0400) Last BM Date : 10/28/2021  Weight change: Filed Weights   10/25/2021 0600 10/30/21 0600 10/15/2021 0600  Weight: 89.5 kg 87.3 kg 86.3 kg    Intake/Output:   Intake/Output Summary (Last 24 hours) at 11/03/2021 0835 Last data filed at 11/02/2021 1935 Gross per 24 hour  Intake 720 ml  Output 600 ml  Net 120 ml      Physical Exam   General:  No resp difficulty HEENT: normal Neck: supple. no JVD. Carotids 2+ bilat; no bruits. No lymphadenopathy or thryomegaly appreciated. RIJ  Cor: PMI nondisplaced. Regular rate & rhythm. No rubs, gallops. 2/6 TR  Lungs: clear Abdomen: soft, nontender, nondistended. No hepatosplenomegaly. No bruits or masses. Good bowel sounds. Extremities: no cyanosis, clubbing, rash, edema Neuro: alert & orientedx3, cranial nerves grossly intact. moves all 4 extremities w/o difficulty. Affect  pleasant    Telemetry   SR/ST 90-100s. Had episode with rate 140s      Labs    CBC Recent Labs    11/01/21 0506 11/02/21 0144  WBC 6.6 8.3  HGB 8.7* 8.7*  HCT 29.6* 30.3*  MCV 99.7 98.4  PLT 371 983*   Basic Metabolic Panel Recent Labs    11/02/21 0144 11/03/21 0044  NA 136 139  K 3.9 3.2*  CL 102 99  CO2 26 31  GLUCOSE 109* 92  BUN 16 19  CREATININE 1.53* 1.66*  CALCIUM 9.1 8.7*  MG 2.1 1.8   Liver Function Tests Recent Labs    11/02/21 0144 11/03/21 0044  AST 31 31  ALT 23 23  ALKPHOS 106 107  BILITOT 0.4 0.2*  PROT 5.6* 5.7*  ALBUMIN 2.4* 2.5*   No results for input(s): LIPASE, AMYLASE in the last 72 hours. Cardiac Enzymes No results for input(s): CKTOTAL, CKMB, CKMBINDEX, TROPONINI in the last 72 hours.  BNP: BNP (last 3 results) Recent Labs    02/24/21 1357 07/16/21 1145 10/30/2021 2138  BNP 326.4* 523.7* 477.9*    ProBNP (last 3 results) No results for input(s): PROBNP in the last 8760 hours.   D-Dimer No results for input(s): DDIMER in the last 72 hours. Hemoglobin A1C No results for input(s): HGBA1C in the last 72 hours. Fasting Lipid Panel No results for input(s): CHOL, HDL, LDLCALC, TRIG, CHOLHDL, LDLDIRECT in the last 72 hours.  Thyroid Function Tests No results for input(s): TSH, T4TOTAL, T3FREE, THYROIDAB in the last  72 hours.  Invalid input(s): FREET3  Other results:   Imaging    No results found.   Medications:     Scheduled Medications:  allopurinol  100 mg Oral Q1500   amiodarone  200 mg Oral Daily   atorvastatin  10 mg Oral q AM   buPROPion  150 mg Oral BID   Chlorhexidine Gluconate Cloth  6 each Topical Daily   cholecalciferol  5,000 Units Oral q AM   empagliflozin  10 mg Oral Daily   ferrous [id number] C-folic acid  1 capsule Oral BID PC   lactose free nutrition  237 mL Oral TID WC   multivitamin with minerals  1 tablet Oral Daily   pantoprazole  40 mg Oral Daily   torsemide  60 mg Oral  BID   venlafaxine XR  150 mg Oral Q breakfast   zolpidem  5 mg Oral QHS    Infusions:    PRN Medications: acetaminophen, hydrOXYzine, lip balm, nitroGLYCERIN, ondansetron (ZOFRAN) IV   Assessment/Plan   Pericardial effusion: -Doubt from Watchman procedure in 12/22 -Effusion appears moderate in size on echo 02/21.  -[place] 02/22 with equalization of right and left-sided pressures with no evidence of LV-RV interaction -There does not appear to be any evidence of tamponade. Suspect main issue is severe TR with markedly elevated RA pressures and inability to drain pericardium.  2. Chronic systolic CHF: - felt to have LBBB CM vs chemo induced (got adriamycin). 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 No effusion -Echo 02/21: EF 35-40%, RV severely enlarged with okay function, moderate MR, severe TR with flail posterior leaflet -RHC 02/22: RA 17, PA 33/21 (25), PCWP 18, PAPi 0.6 (in setting of severe TR), PA sat 47%, 48% - Marginal PA sat on RHC. Monitor for signs of low-output - Volume status looks ok. CVP is not hooked up.  - Creatinine trending up. Cut back torsemide to 60 mg daily.  - Home spiro and bisoprolol held d/t hypotension - Continue jardiance 10 mg daily  3. Severe TR - with RV failure - TCTS planning for TVR + possible MAZE tomorrow    4. PAF/tachycardia - Unable to complete ablation 10/22 d/t pericardial effusion - S/p Watchman implant 08/21/21 - On amiodarone 200 mg daily - Would not add beta blocker or other rate rate controlling medications in setting of RV failure  5. 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 1.5>1.7  - As above cut back torsemide to 60 mg daily  - In setting of GI bleed and hypotension  6. GI bleeding - BRBPR + melena 4-5  days prior to admission - Per her report CT at OSH with diverticulosis but no diverticulitis - Denies NSAID use - 1 u PRBCs this admit - Check CBC now.  - EGD/Colonoscopy- R colonic ischemic ulcers - suspect in the setting of low output hf., 2 polyps removed, diverticulosis.  - On protonix.   6. OSA - Unable to tolerate CPAP  7. Hypokalemia/ hypomagnesia - Supp K and Mag   8. Ischemic colitis - suspect related to mesenteric ischemia in setting of low arterial pressure and high venous outflow pressures from RV failure - No h/o ischemic colitis symptoms - Has CT abdomen from OSH (non contrast) no documentation of ab  arterial calcification - Dr Haroldine Laws discussed with VVS. Could get contrast CT abdomen to further evaluate for mesenteric disease but suspicion low given lack of RFs and/or previous symptoms.  - With Scr 1.7, Irisk of CT outweighs benefit.   Continue to ambuate.   Check CBC now.    Length of Stay: 7  Darrick Grinder, NP  11/03/2021, 8:35 AM  Advanced Heart Failure Team Pager [phone] (M-F; 7a - 5p)  Please contact [facility] for night-coverage after hours (5p -7a ) and weekends on [email or web address]    Patient seen and examined with the above-signed Advanced Practice Provider and/or Housestaff. I personally reviewed laboratory data, imaging studies and relevant notes. I independently examined the patient and formulated the important aspects of the plan. I have edited the note to reflect any of my changes or salient points. I have personally discussed the plan with the patient and/or family.  Feels ok. Denies CP, SOB, orthopnea, PND or ab pain. No further BRBPR. Scr up a bit.   General:  Lying in bed.  No resp difficulty HEENT: normal Neck: supple. RIJ TLC . Carotids 2+ bilat; no bruits. No lymphadenopathy or thryomegaly appreciated. Cor: PMI nondisplaced. Regular rate & rhythm. 2/6 TR Lungs: clear Abdomen: soft, nontender, nondistended. No hepatosplenomegaly. No bruits or  masses. Good bowel sounds. Extremities: no cyanosis, clubbing, rash, edema Neuro: alert & orientedx3, cranial nerves grossly intact. moves all 4 extremities w/o difficulty. Affect pleasant  Scr up. Hold diuretics. Encourage po intake. For TVR tomorrow. Surgery discussed with her.   Glori Bickers, MD  9:51 AM

## 2021-11-03 NOTE — Care Management Important Message (Signed)
Important Message  Patient Details  Name: [name] MRN: [id number] Date of Birth: [birth year]   Medicare Important Message Given:  Yes     Orbie Pyo 11/03/2021, 2:34 PM

## 2021-11-03 NOTE — Progress Notes (Signed)
Patient had cath on 10/23/2021 via R radial artery. Now US showed R radial artery occluded. She has no pain in the wrist, no sign of ischemia in the hand, and normal sensation. I spoke with Junie Panning of CT surgery since patient has scheduled tricuspid valve surgery tomorrow, from her perspective, this will not interfere with her surgery tomorrow other than they can't put a A-line in the R radial artery. I informed Dr. Haroldine Laws.

## 2021-11-03 NOTE — Plan of Care (Signed)

## 2021-11-04 ENCOUNTER — Inpatient Hospital Stay (HOSPITAL_COMMUNITY)
Admission: AD | Disposition: E | Payer: Self-pay | Source: Other Acute Inpatient Hospital | Attending: Cardiothoracic Surgery

## 2021-11-04 ENCOUNTER — Inpatient Hospital Stay (HOSPITAL_COMMUNITY): Payer: Medicare PPO

## 2021-11-04 ENCOUNTER — Inpatient Hospital Stay (HOSPITAL_COMMUNITY): Payer: Medicare PPO | Admitting: Anesthesiology

## 2021-11-04 ENCOUNTER — Other Ambulatory Visit: Payer: Self-pay

## 2021-11-04 DIAGNOSIS — I482 Chronic atrial fibrillation, unspecified: Secondary | ICD-10-CM

## 2021-11-04 DIAGNOSIS — I130 Hypertensive heart and chronic kidney disease with heart failure and stage 1 through stage 4 chronic kidney disease, or unspecified chronic kidney disease: Secondary | ICD-10-CM

## 2021-11-04 DIAGNOSIS — Z9889 Other specified postprocedural states: Secondary | ICD-10-CM

## 2021-11-04 DIAGNOSIS — I351 Nonrheumatic aortic (valve) insufficiency: Secondary | ICD-10-CM | POA: Diagnosis not present

## 2021-11-04 DIAGNOSIS — N1832 Chronic kidney disease, stage 3b: Secondary | ICD-10-CM

## 2021-11-04 DIAGNOSIS — I071 Rheumatic tricuspid insufficiency: Secondary | ICD-10-CM

## 2021-11-04 DIAGNOSIS — I5022 Chronic systolic (congestive) heart failure: Secondary | ICD-10-CM

## 2021-11-04 DIAGNOSIS — I361 Nonrheumatic tricuspid (valve) insufficiency: Secondary | ICD-10-CM | POA: Diagnosis not present

## 2021-11-04 DIAGNOSIS — I428 Other cardiomyopathies: Secondary | ICD-10-CM

## 2021-11-04 HISTORY — PX: TEE WITHOUT CARDIOVERSION: SHX5443

## 2021-11-04 HISTORY — PX: TRICUSPID VALVE REPLACEMENT: SHX816

## 2021-11-04 LAB — POCT I-STAT, CHEM 8
BUN: 18 mg/dL (ref 8–23)
BUN: 18 mg/dL (ref 8–23)
BUN: 17 mg/dL (ref 8–23)
BUN: 18 mg/dL (ref 8–23)
BUN: 18 mg/dL (ref 8–23)
BUN: 18 mg/dL (ref 8–23)
BUN: 18 mg/dL (ref 8–23)
Calcium, Ion: 1.18 mmol/L (ref 1.15–1.40)
Calcium, Ion: 1.18 mmol/L (ref 1.15–1.40)
Calcium, Ion: 0.97 mmol/L — ABNORMAL LOW (ref 1.15–1.40)
Calcium, Ion: 1.03 mmol/L — ABNORMAL LOW (ref 1.15–1.40)
Calcium, Ion: 1.04 mmol/L — ABNORMAL LOW (ref 1.15–1.40)
Calcium, Ion: 1.22 mmol/L (ref 1.15–1.40)
Calcium, Ion: 1.44 mmol/L — ABNORMAL HIGH (ref 1.15–1.40)
Chloride: 97 mmol/L — ABNORMAL LOW (ref 98–111)
Chloride: 96 mmol/L — ABNORMAL LOW (ref 98–111)
Chloride: 96 mmol/L — ABNORMAL LOW (ref 98–111)
Chloride: 97 mmol/L — ABNORMAL LOW (ref 98–111)
Chloride: 97 mmol/L — ABNORMAL LOW (ref 98–111)
Chloride: 100 mmol/L (ref 98–111)
Chloride: 102 mmol/L (ref 98–111)
Creatinine, Ser: 1.6 mg/dL — ABNORMAL HIGH (ref 0.44–1.00)
Creatinine, Ser: 1.5 mg/dL — ABNORMAL HIGH (ref 0.44–1.00)
Creatinine, Ser: 1.5 mg/dL — ABNORMAL HIGH (ref 0.44–1.00)
Creatinine, Ser: 1.3 mg/dL — ABNORMAL HIGH (ref 0.44–1.00)
Creatinine, Ser: 1.4 mg/dL — ABNORMAL HIGH (ref 0.44–1.00)
Creatinine, Ser: 1.2 mg/dL — ABNORMAL HIGH (ref 0.44–1.00)
Creatinine, Ser: 1.3 mg/dL — ABNORMAL HIGH (ref 0.44–1.00)
Glucose, Bld: 108 mg/dL — ABNORMAL HIGH (ref 70–99)
Glucose, Bld: 118 mg/dL — ABNORMAL HIGH (ref 70–99)
Glucose, Bld: 169 mg/dL — ABNORMAL HIGH (ref 70–99)
Glucose, Bld: 166 mg/dL — ABNORMAL HIGH (ref 70–99)
Glucose, Bld: 156 mg/dL — ABNORMAL HIGH (ref 70–99)
Glucose, Bld: 205 mg/dL — ABNORMAL HIGH (ref 70–99)
Glucose, Bld: 210 mg/dL — ABNORMAL HIGH (ref 70–99)
HCT: 32 % — ABNORMAL LOW (ref 36.0–46.0)
HCT: 28 % — ABNORMAL LOW (ref 36.0–46.0)
HCT: 23 % — ABNORMAL LOW (ref 36.0–46.0)
HCT: 22 % — ABNORMAL LOW (ref 36.0–46.0)
HCT: 21 % — ABNORMAL LOW (ref 36.0–46.0)
HCT: 21 % — ABNORMAL LOW (ref 36.0–46.0)
HCT: 25 % — ABNORMAL LOW (ref 36.0–46.0)
Hemoglobin: 10.9 g/dL — ABNORMAL LOW (ref 12.0–15.0)
Hemoglobin: 9.5 g/dL — ABNORMAL LOW (ref 12.0–15.0)
Hemoglobin: 7.8 g/dL — ABNORMAL LOW (ref 12.0–15.0)
Hemoglobin: 7.5 g/dL — ABNORMAL LOW (ref 12.0–15.0)
Hemoglobin: 7.1 g/dL — ABNORMAL LOW (ref 12.0–15.0)
Hemoglobin: 7.1 g/dL — ABNORMAL LOW (ref 12.0–15.0)
Hemoglobin: 8.5 g/dL — ABNORMAL LOW (ref 12.0–15.0)
Potassium: 3.4 mmol/L — ABNORMAL LOW (ref 3.5–5.1)
Potassium: 3.5 mmol/L (ref 3.5–5.1)
Potassium: 3.3 mmol/L — ABNORMAL LOW (ref 3.5–5.1)
Potassium: 3.7 mmol/L (ref 3.5–5.1)
Potassium: 4.4 mmol/L (ref 3.5–5.1)
Potassium: 3.6 mmol/L (ref 3.5–5.1)
Potassium: 3.7 mmol/L (ref 3.5–5.1)
Sodium: 137 mmol/L (ref 135–145)
Sodium: 137 mmol/L (ref 135–145)
Sodium: 137 mmol/L (ref 135–145)
Sodium: 137 mmol/L (ref 135–145)
Sodium: 136 mmol/L (ref 135–145)
Sodium: 137 mmol/L (ref 135–145)
Sodium: 138 mmol/L (ref 135–145)
TCO2: 29 mmol/L (ref 22–32)
TCO2: 28 mmol/L (ref 22–32)
TCO2: 34 mmol/L — ABNORMAL HIGH (ref 22–32)
TCO2: 34 mmol/L — ABNORMAL HIGH (ref 22–32)
TCO2: 32 mmol/L (ref 22–32)
TCO2: 27 mmol/L (ref 22–32)
TCO2: 28 mmol/L (ref 22–32)

## 2021-11-04 LAB — POCT I-STAT 7, (LYTES, BLD GAS, ICA,H+H)
Acid-Base Excess: 5 mmol/L — ABNORMAL HIGH (ref 0.0–2.0)
Acid-Base Excess: 12 mmol/L — ABNORMAL HIGH (ref 0.0–2.0)
Acid-Base Excess: 13 mmol/L — ABNORMAL HIGH (ref 0.0–2.0)
Acid-Base Excess: 6 mmol/L — ABNORMAL HIGH (ref 0.0–2.0)
Acid-Base Excess: 0 mmol/L (ref 0.0–2.0)
Acid-base deficit: 2 mmol/L (ref 0.0–2.0)
Acid-base deficit: 4 mmol/L — ABNORMAL HIGH (ref 0.0–2.0)
Acid-base deficit: 6 mmol/L — ABNORMAL HIGH (ref 0.0–2.0)
Acid-base deficit: 4 mmol/L — ABNORMAL HIGH (ref 0.0–2.0)
Acid-base deficit: 5 mmol/L — ABNORMAL HIGH (ref 0.0–2.0)
Bicarbonate: 29.3 mmol/L — ABNORMAL HIGH (ref 20.0–28.0)
Bicarbonate: 33.6 mmol/L — ABNORMAL HIGH (ref 20.0–28.0)
Bicarbonate: 34 mmol/L — ABNORMAL HIGH (ref 20.0–28.0)
Bicarbonate: 30.5 mmol/L — ABNORMAL HIGH (ref 20.0–28.0)
Bicarbonate: 23.4 mmol/L (ref 20.0–28.0)
Bicarbonate: 24 mmol/L (ref 20.0–28.0)
Bicarbonate: 24.7 mmol/L (ref 20.0–28.0)
Bicarbonate: 22.3 mmol/L (ref 20.0–28.0)
Bicarbonate: 22.7 mmol/L (ref 20.0–28.0)
Bicarbonate: 20.7 mmol/L (ref 20.0–28.0)
Calcium, Ion: 1.22 mmol/L (ref 1.15–1.40)
Calcium, Ion: 1 mmol/L — ABNORMAL LOW (ref 1.15–1.40)
Calcium, Ion: 0.97 mmol/L — ABNORMAL LOW (ref 1.15–1.40)
Calcium, Ion: 1.03 mmol/L — ABNORMAL LOW (ref 1.15–1.40)
Calcium, Ion: 1.16 mmol/L (ref 1.15–1.40)
Calcium, Ion: 1.47 mmol/L — ABNORMAL HIGH (ref 1.15–1.40)
Calcium, Ion: 1.34 mmol/L (ref 1.15–1.40)
Calcium, Ion: 1.31 mmol/L (ref 1.15–1.40)
Calcium, Ion: 1.27 mmol/L (ref 1.15–1.40)
Calcium, Ion: 1.25 mmol/L (ref 1.15–1.40)
HCT: 32 % — ABNORMAL LOW (ref 36.0–46.0)
HCT: 20 % — ABNORMAL LOW (ref 36.0–46.0)
HCT: 20 % — ABNORMAL LOW (ref 36.0–46.0)
HCT: 22 % — ABNORMAL LOW (ref 36.0–46.0)
HCT: 21 % — ABNORMAL LOW (ref 36.0–46.0)
HCT: 28 % — ABNORMAL LOW (ref 36.0–46.0)
HCT: 35 % — ABNORMAL LOW (ref 36.0–46.0)
HCT: 35 % — ABNORMAL LOW (ref 36.0–46.0)
HCT: 32 % — ABNORMAL LOW (ref 36.0–46.0)
HCT: 31 % — ABNORMAL LOW (ref 36.0–46.0)
Hemoglobin: 10.9 g/dL — ABNORMAL LOW (ref 12.0–15.0)
Hemoglobin: 6.8 g/dL — CL (ref 12.0–15.0)
Hemoglobin: 6.8 g/dL — CL (ref 12.0–15.0)
Hemoglobin: 7.5 g/dL — ABNORMAL LOW (ref 12.0–15.0)
Hemoglobin: 7.1 g/dL — ABNORMAL LOW (ref 12.0–15.0)
Hemoglobin: 9.5 g/dL — ABNORMAL LOW (ref 12.0–15.0)
Hemoglobin: 11.9 g/dL — ABNORMAL LOW (ref 12.0–15.0)
Hemoglobin: 11.9 g/dL — ABNORMAL LOW (ref 12.0–15.0)
Hemoglobin: 10.9 g/dL — ABNORMAL LOW (ref 12.0–15.0)
Hemoglobin: 10.5 g/dL — ABNORMAL LOW (ref 12.0–15.0)
O2 Saturation: 100 %
O2 Saturation: 100 %
O2 Saturation: 100 %
O2 Saturation: 100 %
O2 Saturation: 100 %
O2 Saturation: 100 %
O2 Saturation: 99 %
O2 Saturation: 94 %
O2 Saturation: 97 %
O2 Saturation: 98 %
Patient temperature: 36.9
Patient temperature: 36.7
Patient temperature: 36.6
Patient temperature: 36.4
Potassium: 3.4 mmol/L — ABNORMAL LOW (ref 3.5–5.1)
Potassium: 3.6 mmol/L (ref 3.5–5.1)
Potassium: 3.3 mmol/L — ABNORMAL LOW (ref 3.5–5.1)
Potassium: 3.7 mmol/L (ref 3.5–5.1)
Potassium: 4.6 mmol/L (ref 3.5–5.1)
Potassium: 3.6 mmol/L (ref 3.5–5.1)
Potassium: 3.3 mmol/L — ABNORMAL LOW (ref 3.5–5.1)
Potassium: 3.4 mmol/L — ABNORMAL LOW (ref 3.5–5.1)
Potassium: 3.4 mmol/L — ABNORMAL LOW (ref 3.5–5.1)
Potassium: 3.4 mmol/L — ABNORMAL LOW (ref 3.5–5.1)
Sodium: 138 mmol/L (ref 135–145)
Sodium: 137 mmol/L (ref 135–145)
Sodium: 137 mmol/L (ref 135–145)
Sodium: 135 mmol/L (ref 135–145)
Sodium: 138 mmol/L (ref 135–145)
Sodium: 137 mmol/L (ref 135–145)
Sodium: 139 mmol/L (ref 135–145)
Sodium: 139 mmol/L (ref 135–145)
Sodium: 141 mmol/L (ref 135–145)
Sodium: 139 mmol/L (ref 135–145)
TCO2: 30 mmol/L (ref 22–32)
TCO2: 34 mmol/L — ABNORMAL HIGH (ref 22–32)
TCO2: 35 mmol/L — ABNORMAL HIGH (ref 22–32)
TCO2: 32 mmol/L (ref 22–32)
TCO2: 25 mmol/L (ref 22–32)
TCO2: 25 mmol/L (ref 22–32)
TCO2: 27 mmol/L (ref 22–32)
TCO2: 24 mmol/L (ref 22–32)
TCO2: 24 mmol/L (ref 22–32)
TCO2: 22 mmol/L (ref 22–32)
pCO2 arterial: 39.5 mmHg (ref 32–48)
pCO2 arterial: 29.1 mmHg — ABNORMAL LOW (ref 32–48)
pCO2 arterial: 29.1 mmHg — ABNORMAL LOW (ref 32–48)
pCO2 arterial: 44.7 mmHg (ref 32–48)
pCO2 arterial: 44.5 mmHg (ref 32–48)
pCO2 arterial: 36.4 mmHg (ref 32–48)
pCO2 arterial: 62.8 mmHg — ABNORMAL HIGH (ref 32–48)
pCO2 arterial: 54.4 mmHg — ABNORMAL HIGH (ref 32–48)
pCO2 arterial: 47 mmHg (ref 32–48)
pCO2 arterial: 38.1 mmHg (ref 32–48)
pH, Arterial: 7.478 — ABNORMAL HIGH (ref 7.35–7.45)
pH, Arterial: 7.67 (ref 7.35–7.45)
pH, Arterial: 7.675 (ref 7.35–7.45)
pH, Arterial: 7.442 (ref 7.35–7.45)
pH, Arterial: 7.329 — ABNORMAL LOW (ref 7.35–7.45)
pH, Arterial: 7.427 (ref 7.35–7.45)
pH, Arterial: 7.203 — ABNORMAL LOW (ref 7.35–7.45)
pH, Arterial: 7.219 — ABNORMAL LOW (ref 7.35–7.45)
pH, Arterial: 7.29 — ABNORMAL LOW (ref 7.35–7.45)
pH, Arterial: 7.341 — ABNORMAL LOW (ref 7.35–7.45)
pO2, Arterial: 207 mmHg — ABNORMAL HIGH (ref 83–108)
pO2, Arterial: 499 mmHg — ABNORMAL HIGH (ref 83–108)
pO2, Arterial: 596 mmHg — ABNORMAL HIGH (ref 83–108)
pO2, Arterial: 406 mmHg — ABNORMAL HIGH (ref 83–108)
pO2, Arterial: 396 mmHg — ABNORMAL HIGH (ref 83–108)
pO2, Arterial: 383 mmHg — ABNORMAL HIGH (ref 83–108)
pO2, Arterial: 190 mmHg — ABNORMAL HIGH (ref 83–108)
pO2, Arterial: 82 mmHg — ABNORMAL LOW (ref 83–108)
pO2, Arterial: 98 mmHg (ref 83–108)
pO2, Arterial: 107 mmHg (ref 83–108)

## 2021-11-04 LAB — POCT I-STAT EG7
Acid-Base Excess: 6 mmol/L — ABNORMAL HIGH (ref 0.0–2.0)
Acid-base deficit: 3 mmol/L — ABNORMAL HIGH (ref 0.0–2.0)
Bicarbonate: 28.5 mmol/L — ABNORMAL HIGH (ref 20.0–28.0)
Bicarbonate: 22.9 mmol/L (ref 20.0–28.0)
Calcium, Ion: 1.02 mmol/L — ABNORMAL LOW (ref 1.15–1.40)
Calcium, Ion: 1.3 mmol/L (ref 1.15–1.40)
HCT: 21 % — ABNORMAL LOW (ref 36.0–46.0)
HCT: 23 % — ABNORMAL LOW (ref 36.0–46.0)
Hemoglobin: 7.1 g/dL — ABNORMAL LOW (ref 12.0–15.0)
Hemoglobin: 7.8 g/dL — ABNORMAL LOW (ref 12.0–15.0)
O2 Saturation: 73 %
O2 Saturation: 71 %
Potassium: 3.8 mmol/L (ref 3.5–5.1)
Potassium: 3.4 mmol/L — ABNORMAL LOW (ref 3.5–5.1)
Sodium: 138 mmol/L (ref 135–145)
Sodium: 139 mmol/L (ref 135–145)
TCO2: 29 mmol/L (ref 22–32)
TCO2: 24 mmol/L (ref 22–32)
pCO2, Ven: 29.8 mmHg — ABNORMAL LOW (ref 44–60)
pCO2, Ven: 42.1 mmHg — ABNORMAL LOW (ref 44–60)
pH, Ven: 7.589 — ABNORMAL HIGH (ref 7.25–7.43)
pH, Ven: 7.343 (ref 7.25–7.43)
pO2, Ven: 31 mmHg — CL (ref 32–45)
pO2, Ven: 39 mmHg (ref 32–45)

## 2021-11-04 LAB — BASIC METABOLIC PANEL
Anion gap: 11 (ref 5–15)
BUN: 16 mg/dL (ref 8–23)
CO2: 21 mmol/L — ABNORMAL LOW (ref 22–32)
Calcium: 8.5 mg/dL — ABNORMAL LOW (ref 8.9–10.3)
Chloride: 103 mmol/L (ref 98–111)
Creatinine, Ser: 1.67 mg/dL — ABNORMAL HIGH (ref 0.44–1.00)
GFR, Estimated: 33 mL/min — ABNORMAL LOW (ref >60)
Glucose, Bld: 239 mg/dL — ABNORMAL HIGH (ref 70–99)
Potassium: 3.1 mmol/L — ABNORMAL LOW (ref 3.5–5.1)
Sodium: 135 mmol/L (ref 135–145)

## 2021-11-04 LAB — GLUCOSE, CAPILLARY
Glucose-Capillary: 190 mg/dL — ABNORMAL HIGH (ref 70–99)
Glucose-Capillary: 201 mg/dL — ABNORMAL HIGH (ref 70–99)
Glucose-Capillary: 205 mg/dL — ABNORMAL HIGH (ref 70–99)
Glucose-Capillary: 209 mg/dL — ABNORMAL HIGH (ref 70–99)
Glucose-Capillary: 211 mg/dL — ABNORMAL HIGH (ref 70–99)
Glucose-Capillary: 222 mg/dL — ABNORMAL HIGH (ref 70–99)
Glucose-Capillary: 231 mg/dL — ABNORMAL HIGH (ref 70–99)
Glucose-Capillary: 239 mg/dL — ABNORMAL HIGH (ref 70–99)

## 2021-11-04 LAB — CBC
HCT: 30.2 % — ABNORMAL LOW (ref 36.0–46.0)
HCT: 34.6 % — ABNORMAL LOW (ref 36.0–46.0)
HCT: 30.5 % — ABNORMAL LOW (ref 36.0–46.0)
Hemoglobin: 8.9 g/dL — ABNORMAL LOW (ref 12.0–15.0)
Hemoglobin: 10.9 g/dL — ABNORMAL LOW (ref 12.0–15.0)
Hemoglobin: 9.7 g/dL — ABNORMAL LOW (ref 12.0–15.0)
MCH: 28.2 pg (ref 26.0–34.0)
MCH: 29.1 pg (ref 26.0–34.0)
MCH: 29 pg (ref 26.0–34.0)
MCHC: 29.5 g/dL — ABNORMAL LOW (ref 30.0–36.0)
MCHC: 31.5 g/dL (ref 30.0–36.0)
MCHC: 31.8 g/dL (ref 30.0–36.0)
MCV: 95.6 fL (ref 80.0–100.0)
MCV: 92.5 fL (ref 80.0–100.0)
MCV: 91 fL (ref 80.0–100.0)
Platelets: 440 10*3/uL — ABNORMAL HIGH (ref 150–400)
Platelets: 294 10*3/uL (ref 150–400)
Platelets: 293 10*3/uL (ref 150–400)
RBC: 3.16 MIL/uL — ABNORMAL LOW (ref 3.87–5.11)
RBC: 3.74 MIL/uL — ABNORMAL LOW (ref 3.87–5.11)
RBC: 3.35 MIL/uL — ABNORMAL LOW (ref 3.87–5.11)
RDW: 18.6 % — ABNORMAL HIGH (ref 11.5–15.5)
RDW: 19.5 % — ABNORMAL HIGH (ref 11.5–15.5)
RDW: 19.9 % — ABNORMAL HIGH (ref 11.5–15.5)
WBC: 8.2 10*3/uL (ref 4.0–10.5)
WBC: 28.9 10*3/uL — ABNORMAL HIGH (ref 4.0–10.5)
WBC: 28.4 10*3/uL — ABNORMAL HIGH (ref 4.0–10.5)
nRBC: 0 % (ref 0.0–0.2)
nRBC: 0.3 % — ABNORMAL HIGH (ref 0.0–0.2)
nRBC: 0.5 % — ABNORMAL HIGH (ref 0.0–0.2)

## 2021-11-04 LAB — APTT: aPTT: 56 seconds — ABNORMAL HIGH (ref 24–36)

## 2021-11-04 LAB — COMPREHENSIVE METABOLIC PANEL
ALT: 19 U/L (ref 0–44)
AST: 26 U/L (ref 15–41)
Albumin: 2.2 g/dL — ABNORMAL LOW (ref 3.5–5.0)
Alkaline Phosphatase: 90 U/L (ref 38–126)
Anion gap: 6 (ref 5–15)
BUN: 15 mg/dL (ref 8–23)
CO2: 27 mmol/L (ref 22–32)
Calcium: 7.8 mg/dL — ABNORMAL LOW (ref 8.9–10.3)
Chloride: 105 mmol/L (ref 98–111)
Creatinine, Ser: 1.41 mg/dL — ABNORMAL HIGH (ref 0.44–1.00)
GFR, Estimated: 41 mL/min — ABNORMAL LOW (ref >60)
Glucose, Bld: 91 mg/dL (ref 70–99)
Potassium: 2.9 mmol/L — ABNORMAL LOW (ref 3.5–5.1)
Sodium: 138 mmol/L (ref 135–145)
Total Bilirubin: 0.4 mg/dL (ref 0.3–1.2)
Total Protein: 5.2 g/dL — ABNORMAL LOW (ref 6.5–8.1)

## 2021-11-04 LAB — ECHO INTRAOPERATIVE TEE
AR max vel: 1.81 cm2
AV Area VTI: 1.74 cm2
AV Area mean vel: 1.68 cm2
AV Mean grad: 2 mmHg
AV Peak grad: 4.6 mmHg
Ao pk vel: 1.07 m/s
Height: 62 in
MV M vel: 4.84 m/s
MV Peak grad: 93.7 mmHg
Radius: 0.5 cm
S' Lateral: 3.05 cm
Weight: 3001.78 oz

## 2021-11-04 LAB — COOXEMETRY PANEL
Carboxyhemoglobin: 1.3 % (ref 0.5–1.5)
Methemoglobin: <0.7 % (ref 0.0–1.5)
O2 Saturation: 69.7 %
Total hemoglobin: 10.5 g/dL — ABNORMAL LOW (ref 12.0–16.0)

## 2021-11-04 LAB — PROTIME-INR
INR: 1.5 — ABNORMAL HIGH (ref 0.8–1.2)
Prothrombin Time: 17.9 seconds — ABNORMAL HIGH (ref 11.4–15.2)

## 2021-11-04 LAB — PREPARE RBC (CROSSMATCH)

## 2021-11-04 LAB — HEMOGLOBIN AND HEMATOCRIT, BLOOD
HCT: 21 % — ABNORMAL LOW (ref 36.0–46.0)
Hemoglobin: 6.8 g/dL — CL (ref 12.0–15.0)

## 2021-11-04 LAB — MAGNESIUM
Magnesium: 2.1 mg/dL (ref 1.7–2.4)
Magnesium: 3.2 mg/dL — ABNORMAL HIGH (ref 1.7–2.4)

## 2021-11-04 LAB — PLATELET COUNT: Platelets: 209 10*3/uL (ref 150–400)

## 2021-11-04 SURGERY — TRICUSPID VALVE REPAIR
Anesthesia: General | Site: Chest

## 2021-11-04 MED ORDER — AMIODARONE HCL IN DEXTROSE 360-4.14 MG/200ML-% IV SOLN
INTRAVENOUS | Status: DC | PRN
  Administered 2021-11-04: 60 mg/h via INTRAVENOUS

## 2021-11-04 MED ORDER — POTASSIUM CHLORIDE 10 MEQ/50ML IV SOLN
10.0000 meq | INTRAVENOUS | Status: AC
  Administered 2021-11-04 – 2021-11-05 (×3): 10 meq via INTRAVENOUS
  Filled 2021-11-04 (×3): qty 50

## 2021-11-04 MED ORDER — GLUTARALDEHYDE 0.625% SOAKING SOLUTION
TOPICAL | Status: DC | PRN
  Administered 2021-11-04: 1 via TOPICAL

## 2021-11-04 MED ORDER — SODIUM CHLORIDE (PF) 0.9 % IJ SOLN
INTRAMUSCULAR | Status: DC | PRN
  Administered 2021-11-04 (×4): 4 mL via TOPICAL

## 2021-11-04 MED ORDER — LIDOCAINE 2% (20 MG/ML) 5 ML SYRINGE
INTRAMUSCULAR | Status: DC | PRN
Start: 2021-11-04 — End: 2021-11-04
  Administered 2021-11-04: 100 mg via INTRAVENOUS
  Administered 2021-11-04: 60 mg via INTRAVENOUS

## 2021-11-04 MED ORDER — MIDAZOLAM HCL (PF) 10 MG/2ML IJ SOLN
INTRAMUSCULAR | Status: AC
  Filled 2021-11-04: qty 2

## 2021-11-04 MED ORDER — NOREPINEPHRINE 4 MG/250ML-% IV SOLN
0.0000 ug/min | INTRAVENOUS | Status: DC
  Administered 2021-11-04: 10 ug/min via INTRAVENOUS
  Administered 2021-11-05: 12 ug/min via INTRAVENOUS
  Administered 2021-11-05: 8 ug/min via INTRAVENOUS
  Filled 2021-11-04 (×3): qty 250

## 2021-11-04 MED ORDER — PROPOFOL 10 MG/ML IV BOLUS
INTRAVENOUS | Status: AC
  Filled 2021-11-04: qty 20

## 2021-11-04 MED ORDER — MAGNESIUM SULFATE 4 GM/100ML IV SOLN
4.0000 g | Freq: Once | INTRAVENOUS | Status: DC
  Filled 2021-11-04: qty 100

## 2021-11-04 MED ORDER — ONDANSETRON HCL 4 MG/2ML IJ SOLN
4.0000 mg | Freq: Four times a day (QID) | INTRAMUSCULAR | Status: DC | PRN
  Administered 2021-11-05: 4 mg via INTRAVENOUS
  Filled 2021-11-04: qty 2

## 2021-11-04 MED ORDER — FENTANYL CITRATE (PF) 250 MCG/5ML IJ SOLN
INTRAMUSCULAR | Status: AC
  Filled 2021-11-04: qty 5

## 2021-11-04 MED ORDER — DESMOPRESSIN ACETATE 4 MCG/ML IJ SOLN
30.0000 ug | Freq: Once | INTRAMUSCULAR | Status: AC
  Administered 2021-11-04: 30 ug via INTRAVENOUS
  Filled 2021-11-04 (×2): qty 7.5

## 2021-11-04 MED ORDER — SODIUM CHLORIDE 0.9 % IR SOLN
Status: DC | PRN
  Administered 2021-11-04: 6000 mL

## 2021-11-04 MED ORDER — FENTANYL CITRATE (PF) 250 MCG/5ML IJ SOLN
INTRAMUSCULAR | Status: DC | PRN
  Administered 2021-11-04: 100 ug via INTRAVENOUS
  Administered 2021-11-04: 50 ug via INTRAVENOUS
  Administered 2021-11-04: 100 ug via INTRAVENOUS

## 2021-11-04 MED ORDER — AMIODARONE HCL IN DEXTROSE 360-4.14 MG/200ML-% IV SOLN
30.0000 mg/h | INTRAVENOUS | Status: DC
  Administered 2021-11-05 (×2): 30 mg/h via INTRAVENOUS
  Filled 2021-11-04 (×3): qty 200

## 2021-11-04 MED ORDER — DOCUSATE SODIUM 100 MG PO CAPS
200.0000 mg | ORAL_CAPSULE | Freq: Every day | ORAL | Status: DC
  Administered 2021-11-05: 200 mg via ORAL
  Filled 2021-11-04: qty 2

## 2021-11-04 MED ORDER — HEPARIN SODIUM (PORCINE) 1000 UNIT/ML IJ SOLN
INTRAMUSCULAR | Status: DC | PRN
  Administered 2021-11-04: 28000 [IU] via INTRAVENOUS

## 2021-11-04 MED ORDER — PROPOFOL 10 MG/ML IV BOLUS
INTRAVENOUS | Status: DC | PRN
  Administered 2021-11-04: 70 mg via INTRAVENOUS
  Administered 2021-11-04: 10 mg via INTRAVENOUS

## 2021-11-04 MED ORDER — PHENYLEPHRINE HCL-NACL 20-0.9 MG/250ML-% IV SOLN: 0.0000 ug/min | INTRAVENOUS | Status: DC

## 2021-11-04 MED ORDER — METOPROLOL TARTRATE 12.5 MG HALF TABLET: 12.5000 mg | ORAL_TABLET | Freq: Two times a day (BID) | ORAL | Status: DC

## 2021-11-04 MED ORDER — LIDOCAINE 2% (20 MG/ML) 5 ML SYRINGE
INTRAMUSCULAR | Status: AC
  Filled 2021-11-04: qty 5

## 2021-11-04 MED ORDER — MAGNESIUM SULFATE 50 % IJ SOLN
1.0000 g | Freq: Once | INTRAMUSCULAR | Status: DC
Start: 2021-11-04 — End: 2021-11-04
  Filled 2021-11-04: qty 2

## 2021-11-04 MED ORDER — PROPOFOL 500 MG/50ML IV EMUL
INTRAVENOUS | Status: DC | PRN
  Administered 2021-11-04: 50 ug/kg/min via INTRAVENOUS

## 2021-11-04 MED ORDER — ORAL CARE MOUTH RINSE
15.0000 mL | OROMUCOSAL | Status: DC
  Administered 2021-11-04 – 2021-11-05 (×6): 15 mL via OROMUCOSAL

## 2021-11-04 MED ORDER — NITROGLYCERIN IN D5W 200-5 MCG/ML-% IV SOLN: 0.0000 ug/min | INTRAVENOUS | Status: DC

## 2021-11-04 MED ORDER — CHLORHEXIDINE GLUCONATE 0.12 % MT SOLN
15.0000 mL | OROMUCOSAL | Status: AC
  Administered 2021-11-04: 15 mL via OROMUCOSAL

## 2021-11-04 MED ORDER — HEMOSTATIC AGENTS (NO CHARGE) OPTIME
TOPICAL | Status: DC | PRN
  Administered 2021-11-04 (×3): 1 via TOPICAL

## 2021-11-04 MED ORDER — DEXMEDETOMIDINE HCL IN NACL 400 MCG/100ML IV SOLN
0.0000 ug/kg/h | INTRAVENOUS | Status: DC
  Administered 2021-11-04: 0.5 ug/kg/h via INTRAVENOUS
  Filled 2021-11-04: qty 100

## 2021-11-04 MED ORDER — POTASSIUM CHLORIDE 10 MEQ/50ML IV SOLN
10.0000 meq | INTRAVENOUS | Status: AC
  Administered 2021-11-04 (×3): 10 meq via INTRAVENOUS

## 2021-11-04 MED ORDER — SODIUM CHLORIDE 0.9% FLUSH: 10.0000 mL | INTRAVENOUS | Status: DC | PRN

## 2021-11-04 MED ORDER — LACTATED RINGERS IV SOLN: INTRAVENOUS | Status: DC

## 2021-11-04 MED ORDER — ASPIRIN EC 325 MG PO TBEC
325.0000 mg | DELAYED_RELEASE_TABLET | Freq: Every day | ORAL | Status: DC
  Administered 2021-11-05: 325 mg via ORAL
  Filled 2021-11-04: qty 1

## 2021-11-04 MED ORDER — EPINEPHRINE HCL 5 MG/250ML IV SOLN IN NS
0.0000 ug/min | INTRAVENOUS | Status: DC
  Administered 2021-11-05: 5 ug/min via INTRAVENOUS
  Administered 2021-11-05: 10 ug/min via INTRAVENOUS
  Administered 2021-11-06: 13 ug/min via INTRAVENOUS
  Administered 2021-11-06: 10 ug/min via INTRAVENOUS
  Administered 2021-11-07 (×3): 14 ug/min via INTRAVENOUS
  Administered 2021-11-08 (×3): 15 ug/min via INTRAVENOUS
  Administered 2021-11-08 – 2021-11-09 (×2): 14 ug/min via INTRAVENOUS
  Administered 2021-11-09: 13 ug/min via INTRAVENOUS
  Administered 2021-11-10: 5 ug/min via INTRAVENOUS
  Administered 2021-11-10: 4 ug/min via INTRAVENOUS
  Filled 2021-11-04 (×17): qty 250

## 2021-11-04 MED ORDER — EPINEPHRINE 1 MG/10ML IJ SOSY
PREFILLED_SYRINGE | INTRAMUSCULAR | Status: DC | PRN
  Administered 2021-11-04 (×2): .1 mg via INTRAVENOUS

## 2021-11-04 MED ORDER — SODIUM CHLORIDE 0.45 % IV SOLN: INTRAVENOUS | Status: DC | PRN

## 2021-11-04 MED ORDER — AMIODARONE HCL IN DEXTROSE 360-4.14 MG/200ML-% IV SOLN
60.0000 mg/h | INTRAVENOUS | Status: AC
Start: 2021-11-04 — End: 2021-11-04
  Administered 2021-11-04 (×2): 60 mg/h via INTRAVENOUS
  Filled 2021-11-04: qty 200

## 2021-11-04 MED ORDER — CHLORHEXIDINE GLUCONATE CLOTH 2 % EX PADS
6.0000 | MEDICATED_PAD | Freq: Every day | CUTANEOUS | Status: DC
  Administered 2021-11-04 – 2021-11-11 (×8): 6 via TOPICAL

## 2021-11-04 MED ORDER — HEPARIN SODIUM (PORCINE) 1000 UNIT/ML IJ SOLN
INTRAMUSCULAR | Status: AC
  Filled 2021-11-04: qty 1

## 2021-11-04 MED ORDER — DEXTROSE 50 % IV SOLN: 0.0000 mL | INTRAVENOUS | Status: DC | PRN

## 2021-11-04 MED ORDER — PANTOPRAZOLE SODIUM 40 MG PO TBEC: 40.0000 mg | DELAYED_RELEASE_TABLET | Freq: Every day | ORAL | Status: DC

## 2021-11-04 MED ORDER — ASPIRIN 81 MG PO CHEW
324.0000 mg | CHEWABLE_TABLET | Freq: Every day | ORAL | Status: DC
  Administered 2021-11-06 – 2021-11-17 (×12): 324 mg
  Filled 2021-11-04 (×12): qty 4

## 2021-11-04 MED ORDER — ALBUMIN HUMAN 5 % IV SOLN
250.0000 mL | INTRAVENOUS | Status: AC | PRN
  Administered 2021-11-04 – 2021-11-05 (×2): 12.5 g via INTRAVENOUS

## 2021-11-04 MED ORDER — MAGNESIUM SULFATE 2 GM/50ML IV SOLN
2.0000 g | Freq: Once | INTRAVENOUS | Status: AC
  Administered 2021-11-04: 2 g via INTRAVENOUS
  Filled 2021-11-04: qty 50

## 2021-11-04 MED ORDER — BISACODYL 5 MG PO TBEC
10.0000 mg | DELAYED_RELEASE_TABLET | Freq: Every day | ORAL | Status: DC
  Administered 2021-11-05 – 2021-11-14 (×4): 10 mg via ORAL
  Filled 2021-11-04 (×4): qty 2

## 2021-11-04 MED ORDER — HEPARIN 6000 UNIT IRRIGATION SOLUTION
Status: AC
  Filled 2021-11-04: qty 500

## 2021-11-04 MED ORDER — GLUTARALDEHYDE 0.625% SOAKING SOLUTION
TOPICAL | Status: DC
  Filled 2021-11-04: qty 50

## 2021-11-04 MED ORDER — PROPOFOL 1000 MG/100ML IV EMUL
INTRAVENOUS | Status: AC
  Filled 2021-11-04: qty 100

## 2021-11-04 MED ORDER — CALCIUM CHLORIDE 10 % IV SOLN
INTRAVENOUS | Status: AC
  Filled 2021-11-04: qty 10

## 2021-11-04 MED ORDER — PROTAMINE SULFATE 10 MG/ML IV SOLN
INTRAVENOUS | Status: DC | PRN
  Administered 2021-11-04: 20 mg via INTRAVENOUS
  Administered 2021-11-04: 270 mg via INTRAVENOUS
  Administered 2021-11-04: 10 mg via INTRAVENOUS

## 2021-11-04 MED ORDER — ROCURONIUM BROMIDE 10 MG/ML (PF) SYRINGE
PREFILLED_SYRINGE | INTRAVENOUS | Status: AC
  Filled 2021-11-04: qty 10

## 2021-11-04 MED ORDER — SODIUM CHLORIDE 0.9 % IV SOLN: 10.0000 mL/h | Freq: Once | INTRAVENOUS | Status: DC

## 2021-11-04 MED ORDER — VANCOMYCIN HCL IN DEXTROSE 1-5 GM/200ML-% IV SOLN
1000.0000 mg | Freq: Once | INTRAVENOUS | Status: AC
  Administered 2021-11-04: 1000 mg via INTRAVENOUS
  Filled 2021-11-04: qty 200

## 2021-11-04 MED ORDER — LACTATED RINGERS IV SOLN: INTRAVENOUS | Status: DC | PRN

## 2021-11-04 MED ORDER — FENTANYL CITRATE PF 50 MCG/ML IJ SOSY
50.0000 ug | PREFILLED_SYRINGE | INTRAMUSCULAR | Status: DC | PRN
  Administered 2021-11-05: 100 ug via INTRAVENOUS
  Administered 2021-11-05 (×5): 50 ug via INTRAVENOUS
  Administered 2021-11-06: 100 ug via INTRAVENOUS
  Administered 2021-11-06: 50 ug via INTRAVENOUS
  Administered 2021-11-06: 100 ug via INTRAVENOUS
  Filled 2021-11-04 (×2): qty 1
  Filled 2021-11-04 (×2): qty 2
  Filled 2021-11-04: qty 1
  Filled 2021-11-04: qty 2
  Filled 2021-11-04 (×3): qty 1

## 2021-11-04 MED ORDER — SODIUM CHLORIDE 0.9% FLUSH
10.0000 mL | Freq: Two times a day (BID) | INTRAVENOUS | Status: DC
  Administered 2021-11-04 – 2021-11-05 (×3): 10 mL
  Administered 2021-11-06: 40 mL
  Administered 2021-11-06 – 2021-11-08 (×3): 10 mL
  Administered 2021-11-09: 20 mL
  Administered 2021-11-10 – 2021-11-17 (×16): 10 mL

## 2021-11-04 MED ORDER — SODIUM CHLORIDE 0.9% IV SOLUTION: Freq: Once | INTRAVENOUS | Status: DC

## 2021-11-04 MED ORDER — SODIUM BICARBONATE 8.4 % IV SOLN
50.0000 meq | Freq: Once | INTRAVENOUS | Status: AC
  Administered 2021-11-04: 50 meq via INTRAVENOUS

## 2021-11-04 MED ORDER — SODIUM CHLORIDE 0.9 % IV SOLN: 250.0000 mL | INTRAVENOUS | Status: DC

## 2021-11-04 MED ORDER — METOPROLOL TARTRATE 5 MG/5ML IV SOLN: 2.5000 mg | INTRAVENOUS | Status: DC | PRN

## 2021-11-04 MED ORDER — FAMOTIDINE IN NACL 20-0.9 MG/50ML-% IV SOLN
20.0000 mg | Freq: Two times a day (BID) | INTRAVENOUS | Status: AC
  Administered 2021-11-04 (×2): 20 mg via INTRAVENOUS
  Filled 2021-11-04 (×2): qty 50

## 2021-11-04 MED ORDER — PHENYLEPHRINE 40 MCG/ML (10ML) SYRINGE FOR IV PUSH (FOR BLOOD PRESSURE SUPPORT)
PREFILLED_SYRINGE | INTRAVENOUS | Status: AC
  Filled 2021-11-04: qty 10

## 2021-11-04 MED ORDER — CHLORHEXIDINE GLUCONATE 0.12% ORAL RINSE (MEDLINE KIT)
15.0000 mL | Freq: Two times a day (BID) | OROMUCOSAL | Status: DC
  Administered 2021-11-04 – 2021-11-05 (×2): 15 mL via OROMUCOSAL

## 2021-11-04 MED ORDER — HEPARIN 6000 UNIT IRRIGATION SOLUTION
Status: DC | PRN
  Administered 2021-11-04: 1

## 2021-11-04 MED ORDER — METOPROLOL TARTRATE 25 MG/10 ML ORAL SUSPENSION: 12.5000 mg | Freq: Two times a day (BID) | ORAL | Status: DC

## 2021-11-04 MED ORDER — ROCURONIUM BROMIDE 10 MG/ML (PF) SYRINGE
PREFILLED_SYRINGE | INTRAVENOUS | Status: DC | PRN
Start: 2021-11-04 — End: 2021-11-04
  Administered 2021-11-04: 70 mg via INTRAVENOUS
  Administered 2021-11-04: 20 mg via INTRAVENOUS
  Administered 2021-11-04: 30 mg via INTRAVENOUS
  Administered 2021-11-04: 10 mg via INTRAVENOUS

## 2021-11-04 MED ORDER — SODIUM CHLORIDE 0.9 % IV SOLN: INTRAVENOUS | Status: DC

## 2021-11-04 MED ORDER — LACTATED RINGERS IV SOLN
500.0000 mL | Freq: Once | INTRAVENOUS | Status: AC | PRN
  Administered 2021-11-05: 500 mL via INTRAVENOUS

## 2021-11-04 MED ORDER — BISACODYL 10 MG RE SUPP
10.0000 mg | Freq: Every day | RECTAL | Status: DC
  Administered 2021-11-07 – 2021-11-12 (×4): 10 mg via RECTAL
  Filled 2021-11-04 (×4): qty 1

## 2021-11-04 MED ORDER — INSULIN REGULAR(HUMAN) IN NACL 100-0.9 UT/100ML-% IV SOLN
INTRAVENOUS | Status: DC
  Administered 2021-11-05: 2.4 [IU]/h via INTRAVENOUS
  Filled 2021-11-04: qty 100

## 2021-11-04 MED ORDER — PHENYLEPHRINE 40 MCG/ML (10ML) SYRINGE FOR IV PUSH (FOR BLOOD PRESSURE SUPPORT)
PREFILLED_SYRINGE | INTRAVENOUS | Status: DC | PRN
  Administered 2021-11-04 (×4): 40 ug via INTRAVENOUS

## 2021-11-04 MED ORDER — MIDAZOLAM HCL 5 MG/5ML IJ SOLN
INTRAMUSCULAR | Status: DC | PRN
  Administered 2021-11-04: 2 mg via INTRAVENOUS
  Administered 2021-11-04 (×4): 1 mg via INTRAVENOUS

## 2021-11-04 MED ORDER — SODIUM CHLORIDE 0.9% FLUSH
3.0000 mL | Freq: Two times a day (BID) | INTRAVENOUS | Status: DC
  Administered 2021-11-05 – 2021-11-17 (×18): 3 mL via INTRAVENOUS

## 2021-11-04 MED ORDER — SODIUM CHLORIDE 0.9% FLUSH: 3.0000 mL | INTRAVENOUS | Status: DC | PRN

## 2021-11-04 MED ORDER — MILRINONE LACTATE IN DEXTROSE 20-5 MG/100ML-% IV SOLN
0.2500 ug/kg/min | INTRAVENOUS | Status: DC
  Administered 2021-11-05 – 2021-11-06 (×3): 0.25 ug/kg/min via INTRAVENOUS
  Filled 2021-11-04 (×3): qty 100

## 2021-11-04 MED ORDER — MIDAZOLAM HCL 2 MG/2ML IJ SOLN: 2.0000 mg | INTRAMUSCULAR | Status: DC | PRN

## 2021-11-04 MED ORDER — CEFAZOLIN SODIUM-DEXTROSE 2-4 GM/100ML-% IV SOLN
2.0000 g | Freq: Three times a day (TID) | INTRAVENOUS | Status: AC
  Administered 2021-11-04 – 2021-11-06 (×6): 2 g via INTRAVENOUS
  Filled 2021-11-04 (×6): qty 100

## 2021-11-04 MED ORDER — NOREPINEPHRINE 4 MG/250ML-% IV SOLN
0.0000 ug/min | INTRAVENOUS | Status: AC
  Administered 2021-11-04: 6 ug/min via INTRAVENOUS

## 2021-11-04 SURGICAL SUPPLY — 95 items
ADAPTER CARDIO PERF ANTE/RETRO (ADAPTER) ×4 IMPLANT
ADPR PRFSN 84XANTGRD RTRGD (ADAPTER) ×2
ADPR TBG 2 MALE LL ART (MISCELLANEOUS) ×2
AGENT HMST KT MTR STRL THRMB (HEMOSTASIS) ×2
APL SWBSTK 6 STRL LF DISP (MISCELLANEOUS)
APPLICATOR COTTON TIP 6 STRL (MISCELLANEOUS) IMPLANT
APPLICATOR COTTON TIP 6IN STRL (MISCELLANEOUS)
BLADE STERNUM SYSTEM 6 (BLADE) ×4 IMPLANT
BLADE SURG 12 STRL SS (BLADE) IMPLANT
BLADE SURG 15 STRL LF DISP TIS (BLADE) ×3 IMPLANT
BLADE SURG 15 STRL SS (BLADE) ×3
CANISTER SUCT 3000ML PPV (MISCELLANEOUS) ×4 IMPLANT
CANN PRFSN 3/8X14X24FR PCFC (MISCELLANEOUS) ×2
CANN PRFSN 3/8XCNCT ST RT ANG (MISCELLANEOUS) ×2
CANNULA AORTIC ROOT 9FR (CANNULA) ×4 IMPLANT
CANNULA GUNDRY RCSP 15FR (MISCELLANEOUS) IMPLANT
CANNULA PRFSN 3/8X14X24FR PCFC (MISCELLANEOUS) IMPLANT
CANNULA PRFSN 3/8XCNCT RT ANG (MISCELLANEOUS) IMPLANT
CANNULA SUMP PERICARDIAL (CANNULA) ×4 IMPLANT
CANNULA VEN MTL TIP RT (MISCELLANEOUS) ×6
CATH RETROPLEGIA CORONARY 14FR (CATHETERS) IMPLANT
CATH ROBINSON RED A/P 18FR (CATHETERS) ×9 IMPLANT
CATH THORACIC 28FR RT ANG (CATHETERS) IMPLANT
CONN 1/2X1/2X1/2  BEN (MISCELLANEOUS) ×3
CONN 1/2X1/2X1/2 BEN (MISCELLANEOUS) ×3 IMPLANT
CONN 3/8X1/2 ST GISH (MISCELLANEOUS) ×8 IMPLANT
CONTAINER PROTECT SURGISLUSH (MISCELLANEOUS) ×8 IMPLANT
DRSG AQUACEL AG ADV 3.5X14 (GAUZE/BANDAGES/DRESSINGS) ×4 IMPLANT
ELECT BLADE 4.0 EZ CLEAN MEGAD (MISCELLANEOUS) ×6
ELECT REM PT RETURN 9FT ADLT (ELECTROSURGICAL) ×6
ELECTRODE BLDE 4.0 EZ CLN MEGD (MISCELLANEOUS) IMPLANT
ELECTRODE REM PT RTRN 9FT ADLT (ELECTROSURGICAL) ×3 IMPLANT
FELT TEFLON 1X6 (MISCELLANEOUS) ×7 IMPLANT
GAUZE 4X4 16PLY ~~LOC~~+RFID DBL (SPONGE) ×4 IMPLANT
GAUZE SPONGE 4X4 12PLY STRL (GAUZE/BANDAGES/DRESSINGS) ×7 IMPLANT
GLOVE SURG ENC MOIS LTX SZ6 (GLOVE) IMPLANT
GLOVE SURG ENC MOIS LTX SZ6.5 (GLOVE) IMPLANT
GLOVE SURG ENC MOIS LTX SZ7 (GLOVE) IMPLANT
GLOVE SURG MICRO LTX SZ6 (GLOVE) ×1 IMPLANT
GLOVE SURG MICRO LTX SZ6.5 (GLOVE) ×6 IMPLANT
GLOVE SURG MICRO LTX SZ7.5 (GLOVE) ×1 IMPLANT
GLOVE SURG NEOP MICRO LF SZ6.5 (GLOVE) ×2 IMPLANT
GOWN STRL REUS W/ TWL LRG LVL3 (GOWN DISPOSABLE) ×12 IMPLANT
GOWN STRL REUS W/TWL LRG LVL3 (GOWN DISPOSABLE) ×21
HEMOSTAT POWDER SURGIFOAM 1G (HEMOSTASIS) ×1 IMPLANT
IV ADAPTER SYR DOUBLE MALE LL (MISCELLANEOUS) ×1 IMPLANT
KIT BASIN OR (CUSTOM PROCEDURE TRAY) ×4 IMPLANT
KIT SUCTION CATH 14FR (SUCTIONS) ×4 IMPLANT
KIT TURNOVER KIT B (KITS) ×4 IMPLANT
LINE VENT (MISCELLANEOUS) ×1 IMPLANT
LOOP VESSEL SUPERMAXI WHITE (MISCELLANEOUS) ×1 IMPLANT
NS IRRIG 1000ML POUR BTL (IV SOLUTION) ×18 IMPLANT
PACK OPEN HEART (CUSTOM PROCEDURE TRAY) ×4 IMPLANT
PAD ARMBOARD 7.5X6 YLW CONV (MISCELLANEOUS) ×8 IMPLANT
PENCIL BUTTON HOLSTER BLD 10FT (ELECTRODE) ×1 IMPLANT
POSITIONER HEAD DONUT 9IN (MISCELLANEOUS) ×4 IMPLANT
POWDER SURGICEL 3.0 GRAM (HEMOSTASIS) ×1 IMPLANT
RING TRICUSPID T28 (Prosthesis & Implant Heart) ×1 IMPLANT
SET MPS 3-ND DEL (MISCELLANEOUS) ×1 IMPLANT
SPONGE T-LAP 18X18 ~~LOC~~+RFID (SPONGE) ×16 IMPLANT
SPONGE T-LAP 4X18 ~~LOC~~+RFID (SPONGE) ×4 IMPLANT
STOPCOCK 4 WAY LG BORE MALE ST (IV SETS) ×1 IMPLANT
SURGIFLO W/THROMBIN 8M KIT (HEMOSTASIS) ×1 IMPLANT
SUT ETHIBOND 2 0 SH (SUTURE) ×6 IMPLANT
SUT ETHIBOND 2 0 SH 36X2 (SUTURE) IMPLANT
SUT ETHIBOND 2 0 V4 (SUTURE) ×2 IMPLANT
SUT ETHIBOND 2 0V4 GREEN (SUTURE) ×2 IMPLANT
SUT ETHIBOND 4 0 RB 1 (SUTURE) ×1 IMPLANT
SUT ETHIBOND 4 0 TF (SUTURE) IMPLANT
SUT ETHIBOND 5 0 C 1 30 (SUTURE) IMPLANT
SUT GORETEX 5 0 TT13 24 (SUTURE) ×5 IMPLANT
SUT PROLENE 3 0 SH 1 (SUTURE) ×4 IMPLANT
SUT PROLENE 3 0 SH DA (SUTURE) ×4 IMPLANT
SUT PROLENE 4 0 RB 1 (SUTURE) ×6
SUT PROLENE 4 0 SH DA (SUTURE) ×4 IMPLANT
SUT PROLENE 4-0 RB1 .5 CRCL 36 (SUTURE) ×6 IMPLANT
SUT PROLENE 5 0 C 1 36 (SUTURE) ×4 IMPLANT
SUT PROLENE 6 0 CC (SUTURE) ×1 IMPLANT
SUT SILK 1 TIES 10X30 (SUTURE) ×4 IMPLANT
SUT SILK 2 0 SH CR/8 (SUTURE) ×6 IMPLANT
SUT STEEL 6MS V (SUTURE) ×1 IMPLANT
SUT VIC AB 1 CTX 18 (SUTURE) ×8 IMPLANT
SUT VIC AB 2-0 CTX 27 (SUTURE) IMPLANT
SUT VIC AB 3-0 X1 27 (SUTURE) IMPLANT
SYSTEM SAHARA CHEST DRAIN ATS (WOUND CARE) ×4 IMPLANT
TAPE CLOTH SURG 4X10 WHT LF (GAUZE/BANDAGES/DRESSINGS) ×1 IMPLANT
TAPE PAPER 2X10 WHT MICROPORE (GAUZE/BANDAGES/DRESSINGS) ×1 IMPLANT
TOWEL GREEN STERILE (TOWEL DISPOSABLE) ×4 IMPLANT
TOWEL GREEN STERILE FF (TOWEL DISPOSABLE) ×4 IMPLANT
TRAY CATH LUMEN 1 20CM STRL (SET/KITS/TRAYS/PACK) ×1 IMPLANT
TRAY FOLEY SLVR 14FR TEMP STAT (SET/KITS/TRAYS/PACK) ×4 IMPLANT
TUBING ART PRESS 72  MALE/FEM (TUBING) ×6
TUBING ART PRESS 72 MALE/FEM (TUBING) IMPLANT
UNDERPAD 30X36 HEAVY ABSORB (UNDERPADS AND DIAPERS) ×4 IMPLANT
WATER STERILE IRR 1000ML POUR (IV SOLUTION) ×8 IMPLANT

## 2021-11-04 NOTE — Anesthesia Procedure Notes (Signed)
Central Venous Catheter Insertion Performed by: Darral Dash, DO, anesthesiologist Start/End02/27/2023 7:05 AM, 11/01/2021 7:20 AM Patient location: Pre-op. Preanesthetic checklist: patient identified, IV checked, site marked, risks and benefits discussed, surgical consent, monitors and equipment checked, pre-op evaluation, timeout performed and anesthesia consent Position: Trendelenburg Lidocaine 1% used for infiltration and patient sedated Hand hygiene performed  and maximum sterile barriers used  Catheter size: 9 Fr Central line was placed.MAC introducer Procedure performed using ultrasound guided technique. Ultrasound Notes:anatomy identified, needle tip was noted to be adjacent to the nerve/plexus identified, no ultrasound evidence of intravascular and/or intraneural injection and image(s) printed for medical record Attempts: 1 Following insertion, line sutured, dressing applied and Biopatch. Post procedure assessment: blood return through all ports, free fluid flow and no air  Patient tolerated the procedure well with no immediate complications. Additional procedure comments: RIJ triple lumen exchanged for 40F MAC introducer over wire. Wire placement confirmed under US guidance with image in record. Marland Kitchen

## 2021-11-04 NOTE — Op Note (Signed)
[name], [name] MEDICAL RECORD NO: [id number] ACCOUNT NO: [id number] DATE OF BIRTH: [birth year] FACILITY: [facility] LOCATION: [facility] PHYSICIAN: Ivin Poot III, MD  Operative Report   DATE OF PROCEDURE: 10/08/2021  OPERATION:   1.  Tricuspid valve repair with Gore-Tex reconstruction of ruptured chords to the posterior leaflet of the tricuspid valve. 2.  Leaflet plasty of the posterior leaflet. 3.  Placement of a 28 mm MC3 tricuspid annuloplasty ring.  SURGEON:  Len Childs, MD  ASSISTANT:  Jadene Pierini, PA-C   A surgical first assistant was needed for this complex cardiac procedure to assist with valve repair, suturing, suctioning and provide exposure  PREOPERATIVE DIAGNOSES:   1.  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 2.  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 3.  Chronic atrial fibrillation.  POSTOPERATIVE DIAGNOSES:   1.  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 2.  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 3.  Chronic atrial fibrillation.  CLINICAL NOTE:  The patient is a 66 year old female with nonischemic cardiomyopathy, followed by the heart failure service.  She was admitted to the hospital for lower GI bleeding.  She had been on Plavix for recent Watchman device placement.  The GI  bleeding made her heart failure worse.  The GI bleeding was investigated and treated and transfusion was needed.  It was thought to be related to her ischemic colitis noted on colonoscopy.  She ultimately stopped GI bleeding and symptoms of ischemic  colitis resolved.  Her Plavix was held.  She underwent evaluation of her tricuspid regurgitation by the heart failure team including a TEE and cardiac catheterization.  The cardiac catheterization showed no  significant coronary disease with poor RV power  index and elevated CVP.  The transesophageal echo showed flail leaflet of the posterior leaflet of the tricuspid valve.  The patient was recommended for high risk tricuspid valve repair in order to improve her RV function and improve her symptoms of  heart failure.  I reviewed all of the studies and discussed the procedure of tricuspid valve repair/replacement with the patient and her husband.  She understood the details of surgery to include general anesthesia, sternotomy and cardiopulmonary bypass.  She understood  we would stop her heart to attempt valve repair, but replacement may be necessary.  She understood the risks of progressive heart failure, organ failure, stroke, infection, bleeding, blood transfusion, and death.  She agreed to proceed with surgery  under what I felt was an informed consent.  DESCRIPTION OF PROCEDURE:  The patient was brought from preoperative holding where informed consent was documented and final issues addressed in a face-to-face meeting.  The patient was placed supine on the operating table and general anesthesia was  induced.  She remained stable.  Her implantable AICD had been deactivated.  A transesophageal probe was placed by the anesthesiologist, which confirmed the preoperative diagnosis of severe TR.  The patient was prepped and draped as a sterile field.  A  proper timeout was performed.  A sternal incision was made.  The pericardium was opened.  The pericardial space was obliterated with adhesions and there was a thick fairly fresh membrane of organized thrombus around the entire surface of the epicardium  and aorta.  This was carefully dissected to free up the heart.  The sternal retractor was placed and the pericardium was suspended.  A large left pleural  effusion was drained.  Pursestrings were placed in the ascending aorta and the superior vena cava.  Heparin was administered.  The patient was then cannulated  for cardiopulmonary bypass with a single cannula in the SVC.  While we were dissecting and preparing for placement of  the IVC cannula, the patient was immediately transferred from cardiopulmonary bypass to normal cardiac function when the blood coming from the oxygenator from the bypass circuit to the patient became deoxygenated.  The patient tolerated this well and was  ventilated and supported with low dose inotropic support until the bypass circuit could be examined and corrected.  We then went on bypass again to test the system; however, there was again lack of oxygenated blood coming from the inflow from the pump.  We did not go to full flow on bypass, but came back off bypass and then the bypass circuit was modified so that the electronic device controlling the oxygen sweep and concentration entering the blood  was  exchaned for  a manual regulator of O2 concentration and  gas sweep  which was manually controlled.  With this application, we were successful in going back  on bypass, maintaining adequate oxygenation to the patient with double venous cannula and vessel loops around the venous cannula.  Cardioplegia cannula was placed in the ascending aorta and the patient was cooled to 32 degrees.  Aortic crossclamp was applied and 1.3 liters of KBC cardioplegia was delivered with good cardioplegic arrest and supple temperature drop less than 12  degrees.  Topical iced saline was used for hypothermia as well.  CO2 was insufflated into the operative field.  Right atriotomy was performed.  The \\Koros  retractor  blades were positioned and provided good exposure of the valve.  The posterior leaflet had a torn chord, which divided into several primary chords at the leaflet  edge.  There was a second chord at the junction between the septal and posterior leaflet, which was also ruptured.  The ventricular and atrial leads previously placed were moved away to improvement of exposure.  2-0 Ethibond  annuloplasty sutures were placed around the circumference of the tricuspid valve and to improve exposure.  Next, the torn chords were reattached  to the papillary muscle with 4-0 Gore-Tex reconstruction sutures.  Next, a leaflet plasty of a cleft in the posterior leaflet was repaired with 2 pledgeted 4-0 Ethibond sutures.The MC3 annuloplasty ring 32mm was then placed with the annuloplasty sutures and conformed to the annulus well.  A saline test was repeated and  showed the regurgitation to be significantly improved and  minimal.  The 2 pacing leads were then reattached to the endocardium in the RV and right atrium.  The Swan catheter was passed through the valve into the right ventricle and left intact.  The atriotomy was closed with 2 layers of running 4-0 Prolene.  A dose of  reanimation dose of the KBC cardioplegia was delivered and then the crossclamp was removed.  The patient was rewarmed and reperfused. Temporary pacing wires were applied.  The lungs were expanded and the ventilator was resumed.  Low to medium dose inotropic support was started for weaning from bypass.  After adequate reperfusion, the patient was  weaned from cardiopulmonary bypass without difficulty.  Echo showed good repair of the tricuspid valve with the tricuspid regurgitation now only minimal.  The venous cannulas were removed.  Protamine was held.  Anesthesia team attempted to pass the PA  catheter from the right ventricular out into the pulmonary  artery and this resulted in immediate ventricular arrhythmias - V-tach and V-fib.  Internal cardiac massage was initiated as a pursestring was placed in the main right atrial body and a cannula  was placed and the patient was immediately placed on cardiopulmonary bypass and then cardioverted back to rhythm after two attempts at cardioversion were not successful prior to going on bypass.  The patient was then reperfused and rested.  IV amiodarone was started.  After adequate  reperfusion, we separated from cardiopulmonary bypass again without difficulty with good hemodynamics.  Echo again showed good RV function and only minimal tricuspid regurgitation.  Protamine was administered and the cannulas were removed.   The patient remained stable.  We did not attempt to pass the swan out of the right atrium, which was used as a central line.  After protamine reversal of the heparin, the patient still had coagulopathy and needed FFP and platelets to improve coagulation function.  The superior pericardial fat was closed over the aorta.  Bilateral pleural tubes and anterior mediastinal tubes  were placed and brought out through separate incisions.  The sternum was closed with interrupted steel wire.  The patient remained stable.  The pectoralis fascia was closed with a running #1 Vicryl.  Subcutaneous and skin layers were closed with running  Vicryl and sterile dressings were applied.  Total cardiopulmonary bypass time was 170 minutes.   VAI D: 10/11/2021 6:10:44 pm T: 10/20/2021 10:40:00 pm  JOB: [id number]/ [id number]

## 2021-11-04 NOTE — Progress Notes (Signed)
Pre Procedure note for inpatients:   [name] has been scheduled for Procedure(s): TRICUSPID VALVE REPAIR OR REPLACEMENT (N/A) possible MAZE (N/A) TRANSESOPHAGEAL ECHOCARDIOGRAM (TEE) (N/A) today. The various methods of treatment have been discussed with the patient. After consideration of the risks, benefits and treatment options the patient has consented to the planned procedure.   The patient has been seen and labs reviewed. There are no changes in the patients condition to prevent proceeding with the planned procedure today.  Recent labs:  Lab Results  Component Value Date   WBC 8.2 10/26/2021   HGB 8.9 (L) 10/18/2021   HCT 30.2 (L) 10/10/2021   PLT 440 (H) 10/10/2021   GLUCOSE 91 10/12/2021   CHOL 130 10/28/2021   TRIG 153 (H) 10/28/2021   HDL 27 (L) 10/28/2021   LDLDIRECT 147.2 01/15/2009   LDLCALC 72 10/28/2021   ALT 19 10/09/2021   AST 26 10/23/2021   NA 138 10/24/2021   K 2.9 (L) 10/30/2021   CL 105 10/30/2021   CREATININE 1.41 (H) 10/18/2021   BUN 15 10/26/2021   CO2 27 10/16/2021   TSH 5.600 (H) 04/03/2021   INR 1.2 11/03/2021   HGBA1C 5.8 (H) 05/07/2021    Dahlia Byes, MD 11/01/2021 7:55 AM

## 2021-11-04 NOTE — Anesthesia Postprocedure Evaluation (Signed)
Anesthesia Post Note  Patient: [name]  Procedure(s) Performed: TRICUSPID VALVE REPAIR USING EDWARDS MC3 TRICUSPID ANNULOPLASTY RING SIZE 28MM (Chest) TRANSESOPHAGEAL ECHOCARDIOGRAM (TEE)     Patient location during evaluation: SICU Anesthesia Type: General Level of consciousness: sedated Pain management: pain level controlled Vital Signs Assessment: post-procedure vital signs reviewed and stable Respiratory status: patient remains intubated per anesthesia plan Cardiovascular status: stable Postop Assessment: no apparent nausea or vomiting Anesthetic complications: no   No notable events documented.  Last Vitals:  Vitals:   10/12/2021 1622 10/09/2021 1646  BP:    Pulse: (!) 102 (!) 106  Resp: 14 14  Temp: 37 C 36.9 C  SpO2: 100% 100%    Last Pain:  Vitals:   10/19/2021 0503  TempSrc: Oral  PainSc:                  March Rummage Mahamed Zalewski

## 2021-11-04 NOTE — Anesthesia Procedure Notes (Signed)
Central Venous Catheter Insertion Performed by: Darral Dash, DO, anesthesiologist Start/End02/21/2023 7:21 AM, 10/08/2021 7:22 AM Patient location: Pre-op. Preanesthetic checklist: patient identified, IV checked, site marked, risks and benefits discussed, surgical consent, monitors and equipment checked, pre-op evaluation, timeout performed and anesthesia consent Position: supine Hand hygiene performed  and maximum sterile barriers used  PA cath was placed.Swan type:thermodilution PA Cath depth:40 Procedure performed without using ultrasound guided technique. Attempts: 1 Patient tolerated the procedure well with no immediate complications. Additional procedure comments: Per surgeon request.

## 2021-11-04 NOTE — Plan of Care (Signed)
°  Problem: Clinical Measurements: Goal: Will remain free from infection Outcome: Progressing   Problem: Elimination: Goal: Will not experience complications related to urinary retention Outcome: Progressing   Problem: Skin Integrity: Goal: Risk for impaired skin integrity will decrease Outcome: Progressing   Problem: Respiratory: Goal: Respiratory status will improve Outcome: Progressing

## 2021-11-04 NOTE — Anesthesia Procedure Notes (Signed)
Arterial Line Insertion Start/End02/19/2023 7:10 AM, 10/15/2021 7:18 AM Performed by: Betha Loa, CRNA, CRNA  Patient location: Pre-op. Preanesthetic checklist: patient identified, IV checked, site marked, risks and benefits discussed, surgical consent, monitors and equipment checked, pre-op evaluation, timeout performed and anesthesia consent Lidocaine 1% used for infiltration Left, radial was placed Catheter size: 20 G Hand hygiene performed , maximum sterile barriers used  and Seldinger technique used  Attempts: 1 Procedure performed without using ultrasound guided technique. Following insertion, dressing applied and Biopatch. Post procedure assessment: normal  Patient tolerated the procedure well with no immediate complications.

## 2021-11-04 NOTE — Brief Op Note (Signed)
10/28/2021 - 10/19/2021  11:46 AM  PATIENT:  Maryann Alar  66 y.o. female  PRE-OPERATIVE DIAGNOSIS:  SEVERE TR  POST-OPERATIVE DIAGNOSIS:  SEVERE TR  PROCEDURE:  Procedure(s): TRICUSPID VALVE REPAIR USING EDWARDS MC3 TRICUSPID ANNULOPLASTY RING SIZE 28MM (N/A) TRANSESOPHAGEAL ECHOCARDIOGRAM (TEE) (N/A)  SURGEON:  Surgeon(s) and Role:    Dahlia Byes, MD - Primary  PHYSICIAN ASSISTANT: Aquarius Tremper PA-C  ASSISTANTS: STAFF   ANESTHESIA:   general  EBL:  BLOOD ADMINISTERED: 2UNITS CC PRBC, 2 UNITS FFP, and 1 UNIT PLTS  DRAINS:  MEDIASTINAL CHEST DRAINS    LOCAL MEDICATIONS USED:  NONE  SPECIMEN:  No Specimen  DISPOSITION OF SPECIMEN:  N/A  COUNTS:  YES  TOURNIQUET:  * No tourniquets in log *  DICTATION: .Other Dictation: Dictation Number PENDING  PLAN OF CARE: Admit to inpatient   PATIENT DISPOSITION:  ICU - intubated and hemodynamically stable.   Delay start of Pharmacological VTE agent (>24hrs) due to surgical blood loss or risk of bleeding: yes

## 2021-11-04 NOTE — Transfer of Care (Signed)
Immediate Anesthesia Transfer of Care Note  Patient: [name]  Procedure(s) Performed: TRICUSPID VALVE REPAIR USING EDWARDS MC3 TRICUSPID ANNULOPLASTY RING SIZE 28MM (Chest) TRANSESOPHAGEAL ECHOCARDIOGRAM (TEE)  Patient Location: SICU  Anesthesia Type:General  Level of Consciousness: sedated and Patient remains intubated per anesthesia plan  Airway & Oxygen Therapy: Patient remains intubated per anesthesia plan and Patient placed on Ventilator (see vital sign flow sheet for setting)  Post-op Assessment: Report given to RN and Post -op Vital signs reviewed and stable  Post vital signs: Reviewed and stable  Last Vitals:  Vitals Value Taken Time  BP    Temp 36.9 C 10/22/2021 1613  Pulse 97 10/18/2021 1613  Resp 12 10/28/2021 1613  SpO2 100 % 10/28/2021 1613  Vitals shown include unvalidated device data.  Last Pain:  Vitals:   10/10/2021 0503  TempSrc: Oral  PainSc:          Complications: No notable events documented.

## 2021-11-04 NOTE — Progress Notes (Signed)
° °   °  [address]       [place], [address]             [phone]      S/p TV repair  Intubated, sedated  BP (!) 87/62    Pulse (!) 106    Temp 98.1 F (36.7 C)    Resp 16    Ht 5\' 2"  (1.575 m)    Wt 85.1 kg    SpO2 100%    BMI 34.31 kg/m   Milrinone 0.25, epi 6, norepi 10  Amiodarone @ 60  CVP 16, Ci 2.3  Intake/Output Summary (Last 24 hours) at 10/25/2021 1849 Last data filed at 10/17/2021 1800 Gross per 24 hour  Intake 4935.38 ml  Output 1105 ml  Net 3830.38 ml   Minimal Ct output  Plan to remain on vent overnight  Remo Lipps C. Roxan Hockey, MD [facility] [phone]

## 2021-11-05 ENCOUNTER — Inpatient Hospital Stay (HOSPITAL_COMMUNITY): Payer: Medicare PPO

## 2021-11-05 ENCOUNTER — Encounter (HOSPITAL_COMMUNITY): Payer: Self-pay | Admitting: Cardiothoracic Surgery

## 2021-11-05 DIAGNOSIS — I3139 Other pericardial effusion (noninflammatory): Secondary | ICD-10-CM | POA: Diagnosis not present

## 2021-11-05 LAB — POCT I-STAT 7, (LYTES, BLD GAS, ICA,H+H)
Acid-base deficit: 3 mmol/L — ABNORMAL HIGH (ref 0.0–2.0)
Acid-base deficit: 1 mmol/L (ref 0.0–2.0)
Acid-base deficit: 3 mmol/L — ABNORMAL HIGH (ref 0.0–2.0)
Bicarbonate: 21.4 mmol/L (ref 20.0–28.0)
Bicarbonate: 22.5 mmol/L (ref 20.0–28.0)
Bicarbonate: 20 mmol/L (ref 20.0–28.0)
Calcium, Ion: 1.27 mmol/L (ref 1.15–1.40)
Calcium, Ion: 1.29 mmol/L (ref 1.15–1.40)
Calcium, Ion: 1.15 mmol/L (ref 1.15–1.40)
HCT: 27 % — ABNORMAL LOW (ref 36.0–46.0)
HCT: 26 % — ABNORMAL LOW (ref 36.0–46.0)
HCT: 21 % — ABNORMAL LOW (ref 36.0–46.0)
Hemoglobin: 9.2 g/dL — ABNORMAL LOW (ref 12.0–15.0)
Hemoglobin: 8.8 g/dL — ABNORMAL LOW (ref 12.0–15.0)
Hemoglobin: 7.1 g/dL — ABNORMAL LOW (ref 12.0–15.0)
O2 Saturation: 97 %
O2 Saturation: 93 %
O2 Saturation: 89 %
Patient temperature: 37.7
Patient temperature: 38
Patient temperature: 38
Potassium: 4 mmol/L (ref 3.5–5.1)
Potassium: 3.9 mmol/L (ref 3.5–5.1)
Potassium: 3.7 mmol/L (ref 3.5–5.1)
Sodium: 137 mmol/L (ref 135–145)
Sodium: 136 mmol/L (ref 135–145)
Sodium: 138 mmol/L (ref 135–145)
TCO2: 22 mmol/L (ref 22–32)
TCO2: 24 mmol/L (ref 22–32)
TCO2: 21 mmol/L — ABNORMAL LOW (ref 22–32)
pCO2 arterial: 33.8 mmHg (ref 32–48)
pCO2 arterial: 35 mmHg (ref 32–48)
pCO2 arterial: 29.7 mmHg — ABNORMAL LOW (ref 32–48)
pH, Arterial: 7.412 (ref 7.35–7.45)
pH, Arterial: 7.421 (ref 7.35–7.45)
pH, Arterial: 7.44 (ref 7.35–7.45)
pO2, Arterial: 93 mmHg (ref 83–108)
pO2, Arterial: 68 mmHg — ABNORMAL LOW (ref 83–108)
pO2, Arterial: 55 mmHg — ABNORMAL LOW (ref 83–108)

## 2021-11-05 LAB — COOXEMETRY PANEL
Carboxyhemoglobin: 0.9 % (ref 0.5–1.5)
Carboxyhemoglobin: 1.1 % (ref 0.5–1.5)
Carboxyhemoglobin: <0.3 % — ABNORMAL LOW (ref 0.5–1.5)
Methemoglobin: <0.7 % (ref 0.0–1.5)
Methemoglobin: <0.7 % (ref 0.0–1.5)
Methemoglobin: 2.3 % — ABNORMAL HIGH (ref 0.0–1.5)
O2 Saturation: 58.1 %
O2 Saturation: 60.4 %
O2 Saturation: 58 %
Total hemoglobin: 7.5 g/dL — ABNORMAL LOW (ref 12.0–16.0)
Total hemoglobin: 8.4 g/dL — ABNORMAL LOW (ref 12.0–16.0)
Total hemoglobin: 8.9 g/dL — ABNORMAL LOW (ref 12.0–16.0)

## 2021-11-05 LAB — CBC
HCT: 27.3 % — ABNORMAL LOW (ref 36.0–46.0)
HCT: 26.9 % — ABNORMAL LOW (ref 36.0–46.0)
Hemoglobin: 8.6 g/dL — ABNORMAL LOW (ref 12.0–15.0)
Hemoglobin: 8.2 g/dL — ABNORMAL LOW (ref 12.0–15.0)
MCH: 28.2 pg (ref 26.0–34.0)
MCH: 28.2 pg (ref 26.0–34.0)
MCHC: 31.5 g/dL (ref 30.0–36.0)
MCHC: 30.5 g/dL (ref 30.0–36.0)
MCV: 89.5 fL (ref 80.0–100.0)
MCV: 92.4 fL (ref 80.0–100.0)
Platelets: 255 10*3/uL (ref 150–400)
Platelets: 283 10*3/uL (ref 150–400)
RBC: 3.05 MIL/uL — ABNORMAL LOW (ref 3.87–5.11)
RBC: 2.91 MIL/uL — ABNORMAL LOW (ref 3.87–5.11)
RDW: 19.7 % — ABNORMAL HIGH (ref 11.5–15.5)
RDW: 19.9 % — ABNORMAL HIGH (ref 11.5–15.5)
WBC: 24.2 10*3/uL — ABNORMAL HIGH (ref 4.0–10.5)
WBC: 36.3 10*3/uL — ABNORMAL HIGH (ref 4.0–10.5)
nRBC: 0.7 % — ABNORMAL HIGH (ref 0.0–0.2)
nRBC: 0.4 % — ABNORMAL HIGH (ref 0.0–0.2)

## 2021-11-05 LAB — PREPARE PLATELET PHERESIS: Unit division: 0

## 2021-11-05 LAB — BPAM PLATELET PHERESIS
Blood Product Expiration Date: 202303022359
ISSUE DATE / TIME: 202302281246
Unit Type and Rh: 6200

## 2021-11-05 LAB — MAGNESIUM
Magnesium: 2.9 mg/dL — ABNORMAL HIGH (ref 1.7–2.4)
Magnesium: 2.8 mg/dL — ABNORMAL HIGH (ref 1.7–2.4)

## 2021-11-05 LAB — BPAM FFP
Blood Product Expiration Date: 202303042359
Blood Product Expiration Date: 202303042359
ISSUE DATE / TIME: 202302281242
ISSUE DATE / TIME: 202302281242
Unit Type and Rh: 6200
Unit Type and Rh: 6200

## 2021-11-05 LAB — GLUCOSE, CAPILLARY
Glucose-Capillary: 111 mg/dL — ABNORMAL HIGH (ref 70–99)
Glucose-Capillary: 118 mg/dL — ABNORMAL HIGH (ref 70–99)
Glucose-Capillary: 121 mg/dL — ABNORMAL HIGH (ref 70–99)
Glucose-Capillary: 123 mg/dL — ABNORMAL HIGH (ref 70–99)
Glucose-Capillary: 127 mg/dL — ABNORMAL HIGH (ref 70–99)
Glucose-Capillary: 132 mg/dL — ABNORMAL HIGH (ref 70–99)
Glucose-Capillary: 132 mg/dL — ABNORMAL HIGH (ref 70–99)
Glucose-Capillary: 134 mg/dL — ABNORMAL HIGH (ref 70–99)
Glucose-Capillary: 160 mg/dL — ABNORMAL HIGH (ref 70–99)
Glucose-Capillary: 163 mg/dL — ABNORMAL HIGH (ref 70–99)
Glucose-Capillary: 95 mg/dL (ref 70–99)

## 2021-11-05 LAB — PREPARE FRESH FROZEN PLASMA: Unit division: 0

## 2021-11-05 LAB — BASIC METABOLIC PANEL
Anion gap: 10 (ref 5–15)
Anion gap: 10 (ref 5–15)
BUN: 16 mg/dL (ref 8–23)
BUN: 18 mg/dL (ref 8–23)
CO2: 21 mmol/L — ABNORMAL LOW (ref 22–32)
CO2: 22 mmol/L (ref 22–32)
Calcium: 8.7 mg/dL — ABNORMAL LOW (ref 8.9–10.3)
Calcium: 7.8 mg/dL — ABNORMAL LOW (ref 8.9–10.3)
Chloride: 105 mmol/L (ref 98–111)
Chloride: 95 mmol/L — ABNORMAL LOW (ref 98–111)
Creatinine, Ser: 1.59 mg/dL — ABNORMAL HIGH (ref 0.44–1.00)
Creatinine, Ser: 1.94 mg/dL — ABNORMAL HIGH (ref 0.44–1.00)
GFR, Estimated: 35 mL/min — ABNORMAL LOW (ref >60)
GFR, Estimated: 28 mL/min — ABNORMAL LOW (ref >60)
Glucose, Bld: 125 mg/dL — ABNORMAL HIGH (ref 70–99)
Glucose, Bld: 272 mg/dL — ABNORMAL HIGH (ref 70–99)
Potassium: 3.9 mmol/L (ref 3.5–5.1)
Potassium: 3.7 mmol/L (ref 3.5–5.1)
Sodium: 136 mmol/L (ref 135–145)
Sodium: 127 mmol/L — ABNORMAL LOW (ref 135–145)

## 2021-11-05 LAB — PROTIME-INR
INR: 1.4 — ABNORMAL HIGH (ref 0.8–1.2)
Prothrombin Time: 17.3 seconds — ABNORMAL HIGH (ref 11.4–15.2)

## 2021-11-05 LAB — APTT: aPTT: 33 seconds (ref 24–36)

## 2021-11-05 LAB — FIBRINOGEN: Fibrinogen: 350 mg/dL (ref 210–475)

## 2021-11-05 MED ORDER — ORAL CARE MOUTH RINSE
15.0000 mL | Freq: Two times a day (BID) | OROMUCOSAL | Status: DC
  Administered 2021-11-05 (×2): 15 mL via OROMUCOSAL

## 2021-11-05 MED ORDER — MIDAZOLAM HCL 2 MG/2ML IJ SOLN: 1.0000 mg | INTRAMUSCULAR | Status: DC | PRN

## 2021-11-05 MED ORDER — FUROSEMIDE 10 MG/ML IJ SOLN
40.0000 mg | Freq: Every day | INTRAMUSCULAR | Status: DC
  Administered 2021-11-05: 40 mg via INTRAVENOUS
  Filled 2021-11-05: qty 4

## 2021-11-05 MED ORDER — BUPROPION HCL 75 MG PO TABS
75.0000 mg | ORAL_TABLET | Freq: Two times a day (BID) | ORAL | Status: DC
  Administered 2021-11-05 (×2): 75 mg via ORAL
  Filled 2021-11-05 (×3): qty 1

## 2021-11-05 MED ORDER — NOREPINEPHRINE 16 MG/250ML-% IV SOLN
0.0000 ug/min | INTRAVENOUS | Status: DC
  Administered 2021-11-05: 20 ug/min via INTRAVENOUS
  Administered 2021-11-06: 40 ug/min via INTRAVENOUS
  Administered 2021-11-06: 52 ug/min via INTRAVENOUS
  Administered 2021-11-07: 48 ug/min via INTRAVENOUS
  Administered 2021-11-07: 46 ug/min via INTRAVENOUS
  Administered 2021-11-07: 48 ug/min via INTRAVENOUS
  Administered 2021-11-08: 45 ug/min via INTRAVENOUS
  Administered 2021-11-08: 40 ug/min via INTRAVENOUS
  Administered 2021-11-09 (×2): 18 ug/min via INTRAVENOUS
  Administered 2021-11-10: 10 ug/min via INTRAVENOUS
  Administered 2021-11-11: 12 ug/min via INTRAVENOUS
  Administered 2021-11-12 – 2021-11-14 (×3): 15 ug/min via INTRAVENOUS
  Administered 2021-11-15: 19 ug/min via INTRAVENOUS
  Administered 2021-11-15: 22 ug/min via INTRAVENOUS
  Administered 2021-11-16: 37.013 ug/min via INTRAVENOUS
  Administered 2021-11-16: 22 ug/min via INTRAVENOUS
  Administered 2021-11-17: 37 ug/min via INTRAVENOUS
  Administered 2021-11-17: 45 ug/min via INTRAVENOUS
  Filled 2021-11-05 (×22): qty 250

## 2021-11-05 MED ORDER — CHLORHEXIDINE GLUCONATE 0.12 % MT SOLN
15.0000 mL | Freq: Two times a day (BID) | OROMUCOSAL | Status: DC
  Administered 2021-11-05: 15 mL via OROMUCOSAL
  Filled 2021-11-05: qty 15

## 2021-11-05 MED ORDER — ACETAMINOPHEN 325 MG PO TABS
650.0000 mg | ORAL_TABLET | Freq: Four times a day (QID) | ORAL | Status: DC
  Administered 2021-11-05 – 2021-11-06 (×3): 650 mg via ORAL
  Filled 2021-11-05 (×4): qty 2

## 2021-11-05 MED ORDER — INSULIN ASPART 100 UNIT/ML IJ SOLN
0.0000 [IU] | INTRAMUSCULAR | Status: DC
  Administered 2021-11-05: 2 [IU] via SUBCUTANEOUS
  Administered 2021-11-05: 4 [IU] via SUBCUTANEOUS
  Administered 2021-11-05 – 2021-11-08 (×3): 2 [IU] via SUBCUTANEOUS
  Administered 2021-11-08 (×2): 8 [IU] via SUBCUTANEOUS
  Administered 2021-11-08 – 2021-11-09 (×3): 2 [IU] via SUBCUTANEOUS
  Administered 2021-11-09: 8 [IU] via SUBCUTANEOUS
  Administered 2021-11-09 (×3): 4 [IU] via SUBCUTANEOUS
  Administered 2021-11-09: 2 [IU] via SUBCUTANEOUS
  Administered 2021-11-10 (×4): 4 [IU] via SUBCUTANEOUS
  Administered 2021-11-10 (×2): 2 [IU] via SUBCUTANEOUS
  Administered 2021-11-11: 4 [IU] via SUBCUTANEOUS
  Administered 2021-11-11: 2 [IU] via SUBCUTANEOUS
  Administered 2021-11-11: 4 [IU] via SUBCUTANEOUS
  Administered 2021-11-11: 2 [IU] via SUBCUTANEOUS
  Administered 2021-11-11: 4 [IU] via SUBCUTANEOUS
  Administered 2021-11-11: 8 [IU] via SUBCUTANEOUS
  Administered 2021-11-12 (×2): 2 [IU] via SUBCUTANEOUS
  Administered 2021-11-12 (×2): 4 [IU] via SUBCUTANEOUS
  Administered 2021-11-12: 2 [IU] via SUBCUTANEOUS
  Administered 2021-11-12 – 2021-11-13 (×3): 4 [IU] via SUBCUTANEOUS
  Administered 2021-11-13: 08:00:00 2 [IU] via SUBCUTANEOUS
  Administered 2021-11-13: 12:00:00 4 [IU] via SUBCUTANEOUS
  Administered 2021-11-13: 17:00:00 3 [IU] via SUBCUTANEOUS
  Administered 2021-11-13 – 2021-11-14 (×2): 4 [IU] via SUBCUTANEOUS
  Administered 2021-11-14: 2 [IU] via SUBCUTANEOUS
  Administered 2021-11-14: 4 [IU] via SUBCUTANEOUS
  Administered 2021-11-14: 8 [IU] via SUBCUTANEOUS
  Administered 2021-11-14: 2 [IU] via SUBCUTANEOUS
  Administered 2021-11-14 – 2021-11-15 (×2): 8 [IU] via SUBCUTANEOUS
  Administered 2021-11-15: 2 [IU] via SUBCUTANEOUS
  Administered 2021-11-15: 8 [IU] via SUBCUTANEOUS
  Administered 2021-11-15: 4 [IU] via SUBCUTANEOUS
  Administered 2021-11-15: 8 [IU] via SUBCUTANEOUS
  Administered 2021-11-15: 2 [IU] via SUBCUTANEOUS
  Administered 2021-11-16: 4 [IU] via SUBCUTANEOUS
  Administered 2021-11-16: 2 [IU] via SUBCUTANEOUS
  Administered 2021-11-16: 4 [IU] via SUBCUTANEOUS
  Administered 2021-11-16: 8 [IU] via SUBCUTANEOUS
  Administered 2021-11-16: 4 [IU] via SUBCUTANEOUS
  Administered 2021-11-16: 2 [IU] via SUBCUTANEOUS
  Administered 2021-11-17: 4 [IU] via SUBCUTANEOUS
  Administered 2021-11-17: 2 [IU] via SUBCUTANEOUS
  Administered 2021-11-17 (×2): 4 [IU] via SUBCUTANEOUS

## 2021-11-05 MED ORDER — INSULIN DETEMIR 100 UNIT/ML ~~LOC~~ SOLN
10.0000 [IU] | Freq: Two times a day (BID) | SUBCUTANEOUS | Status: DC
  Administered 2021-11-05 – 2021-11-07 (×3): 10 [IU] via SUBCUTANEOUS
  Filled 2021-11-05 (×8): qty 0.1

## 2021-11-05 NOTE — Progress Notes (Signed)
1 Day Post-Op Procedure(s) (LRB): ?TRICUSPID VALVE REPAIR USING EDWARDS MC3 TRICUSPID ANNULOPLASTY RING SIZE 28MM (N/A) ?TRANSESOPHAGEAL ECHOCARDIOGRAM (TEE) (N/A) ?Subjective: ?Responsive on vent, starting to wean ?Stable hemodynamics on drips ?CXR clear ?Objective: ?Vital signs in last 24 hours: ?Temp:  [97 ?F (36.1 ?C)-100.2 ?F (37.9 ?C)] 100.2 ?F (37.9 ?C) (03/01 0800) ?Pulse Rate:  [80-106] 86 (03/01 0800) ?Cardiac Rhythm: Ventricular paced (03/01 0800) ?Resp:  [7-28] 25 (03/01 0800) ?BP: (63-116)/(45-74) 98/74 (03/01 0800) ?SpO2:  [93 %-100 %] 98 % (03/01 0800) ?Arterial Line BP: (82-237)/(6-73) 110/52 (03/01 0800) ?FiO2 (%):  [40 %-100 %] 40 % (03/01 0800) ?Weight:  [95.5 kg] 95.5 kg (03/01 0400) ? ?Hemodynamic parameters for last 24 hours: ?PAP: (9-23)/(5-18) 16/14 ?CVP:  [4 mmHg-21 mmHg] 18 mmHg ? ?Intake/Output from previous day: ?02/28 0701 - 03/01 0700 ?In: 3419 [I.V.:5261.2; Blood:1249; NG/GT:60; IV Piggyback:1830.8] ?Out: 1847 [QQIWL:7989; Emesis/NG output:50; Chest Tube:390] ?Intake/Output this shift: ?Total I/O ?In: 102.3 [I.V.:102.3] ?Out: 41 [Urine:45; Chest Tube:10] ? ?  ?   Exam ? ?  General- alert and comfortable on vent ?   Neck- no JVD, no cervical adenopathy palpable, no carotid bruit ?  Lungs- clear without rales, wheezes ?  Cor- regular rate and rhythm, no murmur , gallop ?  Abdomen- soft, non-tender ?  Extremities - warm, non-tender, minimal edema ?  Neuro- oriented, appropriate, no focal weakness ? ? ?Lab Results: ?Recent Labs  ?  10/23/2021 ?2145 11/05/21 ?2119 11/05/21 ?0345  ?WBC 28.4* 24.2*  --   ?HGB 9.7* 8.6* 9.2*  ?HCT 30.5* 27.3* 27.0*  ?PLT 293 255  --   ? ?BMET:  ?Recent Labs  ?  10/10/2021 ?2145 11/05/21 ?4174 11/05/21 ?0345  ?NA 135 136 137  ?K 3.1* 3.9 4.0  ?CL 103 105  --   ?CO2 21* 21*  --   ?GLUCOSE 239* 125*  --   ?BUN 16 16  --   ?CREATININE 1.67* 1.59*  --   ?CALCIUM 8.5* 8.7*  --   ?  ?PT/INR:  ?Recent Labs  ?  11/05/21 ?0356  ?LABPROT 17.3*  ?INR 1.4*  ? ?ABG ?    ?Component Value Date/Time  ? PHART 7.412 11/05/2021 0345  ? HCO3 21.4 11/05/2021 0345  ? TCO2 22 11/05/2021 0345  ? ACIDBASEDEF 3.0 (H) 11/05/2021 0345  ? O2SAT 60.4 11/05/2021 0600  ? ?CBG (last 3)  ?Recent Labs  ?  11/05/21 ?0408 11/05/21 ?0501 11/05/21 ?0814  ?GLUCAP 121* 132* 118*  ? ? ?Assessment/Plan: ?S/P Procedure(s) (LRB): ?TRICUSPID VALVE REPAIR USING EDWARDS MC3 TRICUSPID ANNULOPLASTY RING SIZE 28MM (N/A) ?TRANSESOPHAGEAL ECHOCARDIOGRAM (TEE) (N/A) ?Diuresis ?Diabetes control ?See progression orders ?Wean vent ? ? LOS: 9 days  ? ? ?Dahlia Byes ?11/05/2021 ?  ?

## 2021-11-05 NOTE — Progress Notes (Addendum)
2042- Heart rate irregular with lower BPM (HR going into 60's) than initial assessment. Pt has epicardial wires set VVI 90 bpm/ 10output/2.0 sensitivity, as well as permanent pacer.  ?Multiple attempts at adjusting sensitivity and output with no changes to rhythm. Connections verified.  EKG appears to show pacer not pacing intermittently. Other times it is regular @ 90 bpm. Pt is not symptomatic. BP tolerating rate drop, remains on epi/levo.  ?Looking back at monitor from prior shift, this is not a new issue.  ?11/06/21 0001 Dr. Blossom Hoops made aware of pacemaker issues via page message. Pt still tolerating.  ?0026- patient placed on high flow nasal cannula at 15L. SpO2 was around 87-88%, now 93%. Pt is unable to take deep breaths due to pain and has thick secretions she is having difficulty clearing. ?3013- Acute drop in Spo2 from higher 80's to High 70's. Dr. Blossom Hoops made aware of ABG results. Pt transitioned to  Heated high flow 100% 50L.  ?82- Dr. Cyndia Bent called.  He will review CXR and get back in touch.  ?1438- Dr. Cyndia Bent recommended intubation. CRNA called. En route to room.  ? ?RSI events: ? ?0614- bag valve mask by CRNA ?[phone]- 2mg  versed, 100 mcg fentanyl ?7972- 6mg  etomidate, 120 mg succinylcholine, 120 mcg neo ivp ?ETT placed 8.0 22 @ teeth ?Bilateral breath sounds, Colour change, and CXR on the way.  ? ?0712- pt alert and following commands. Nodding appropriately. PRN 100 mcg fentanyl given for comfort as she was coughing over vent. HR and BP stable.  ? ? ?

## 2021-11-05 NOTE — CV Procedure (Signed)
?  Swan Ganz coiled in RA on CXR.  ? ?Under fluoro guidance Gordy Councilman fully retracted into RA. Then after multiple attempts Swan advanced through TV and into RV and then left PA. ? ?Waveforms checked and looked good. Full hemodynamics measured.  ? ?Glori Bickers, MD  ?9:57 PM ? ? ? ?

## 2021-11-05 NOTE — Progress Notes (Addendum)
Advanced Heart Failure Rounding Note  PCP-Cardiologist: None   Subjective:   02/20: Transferred to [facility] for GI bleed + pericardial effusion 02/22: R/LHC with normal cors, equalized R and L sided pressures with no evidence of LV-RV interaction, RA 17, PA 33/21 (25), Fick CO/CI 4.2/2.2, severely reduced PAPi (in setting of severe TR), PA sat 47%, 48% 2/24 S/P Two polyps in the distal ascending colon, removed with a cold snare. Resected and retrieved. Right colonic ischemic ulcers (healing, no active bleeding, biopsied)-likely etiology of LGI. Moderate sigmoid diverticulosis. 2/25 Diuresed with IV lasix.  2/26 Switched to torsemide 60 /60 .  2/28 S/P TVR . Off pump had VT/VT when PA catheter placed. Had cardiac massage. Cardioverted x2. Unsuccessful. Placed back on pump with restoration of SR. Started on IV amiodarone.   Remains intubated. Awake on the vent. Pain 8/10   Currently on milrinone 25 mcg, Norepi 6 mcg, Epi 5 mcg, and amio 30 mg per hour.    CO-OX 60%.   Flow Trak  CO 4.2 CI 2.2 SVR 971    Objective:   Weight Range: 95.5 kg Body mass index is 38.51 kg/m.   Vital Signs:   Temp:  [97 F (36.1 C)-100 F (37.8 C)] 100 F (37.8 C) (03/01 0700) Pulse Rate:  [80-106] 89 (03/01 0700) Resp:  [7-28] 22 (03/01 0700) BP: (63-116)/(45-74) 97/45 (03/01 0700) SpO2:  [93 %-100 %] 100 % (03/01 0700) Arterial Line BP: (82-237)/(6-73) 107/53 (03/01 0700) FiO2 (%):  [40 %-100 %] 40 % (03/01 0400) Weight:  [95.5 kg] 95.5 kg (03/01 0400) Last BM Date : 11/02/21  Weight change: Filed Weights   10/14/2021 0600 10/25/2021 0503 11/05/21 0400  Weight: 86.3 kg 85.1 kg 95.5 kg    Intake/Output:   Intake/Output Summary (Last 24 hours) at 11/05/2021 0722 Last data filed at 11/05/2021 0700 Gross per 24 hour  Intake 8490.98 ml  Output 1847 ml  Net 6643.98 ml      Physical Exam  CVP 13-14  General:  Intubated  HEENT: ETT Neck: supple. JVP difficult to assess.  Carotids 2+ bilat; no  bruits. No lymphadenopathy or thryomegaly appreciated. RIJ.  Cor: PMI nondisplaced. Regular rate & rhythm. No rubs, gallops or murmurs. Sternal dressing.  Lungs: Coarse throughout. CT x3  Abdomen: soft, nontender, nondistended. No hepatosplenomegaly. No bruits or masses. Good bowel sounds. Extremities: no cyanosis, clubbing, rash, edema. L femoral aline.  Neuro: Intubated. MAEx4. Follows commands.  GU: foley   Telemetry  AV paced 90s. NO VT /VF overnight.   Labs    CBC Recent Labs    10/28/2021 2145 11/05/21 0340 11/05/21 0345  WBC 28.4* 24.2*  --   HGB 9.7* 8.6* 9.2*  HCT 30.5* 27.3* 27.0*  MCV 91.0 89.5  --   PLT 293 255  --    Basic Metabolic Panel Recent Labs    10/26/2021 2145 11/05/21 0340 11/05/21 0345  NA 135 136 137  K 3.1* 3.9 4.0  CL 103 105  --   CO2 21* 21*  --   GLUCOSE 239* 125*  --   BUN 16 16  --   CREATININE 1.67* 1.59*  --   CALCIUM 8.5* 8.7*  --   MG 3.2* 2.9*  --    Liver Function Tests Recent Labs    11/03/21 0044 10/20/2021 0500  AST 31 26  ALT 23 19  ALKPHOS 107 90  BILITOT 0.2* 0.4  PROT 5.7* 5.2*  ALBUMIN 2.5* 2.2*   No results  for input(s): LIPASE, AMYLASE in the last 72 hours. Cardiac Enzymes No results for input(s): CKTOTAL, CKMB, CKMBINDEX, TROPONINI in the last 72 hours.  BNP: BNP (last 3 results) Recent Labs    02/24/21 1357 07/16/21 1145 10/25/2021 2138  BNP 326.4* 523.7* 477.9*    ProBNP (last 3 results) No results for input(s): PROBNP in the last 8760 hours.   D-Dimer No results for input(s): DDIMER in the last 72 hours. Hemoglobin A1C No results for input(s): HGBA1C in the last 72 hours. Fasting Lipid Panel No results for input(s): CHOL, HDL, LDLCALC, TRIG, CHOLHDL, LDLDIRECT in the last 72 hours.  Thyroid Function Tests No results for input(s): TSH, T4TOTAL, T3FREE, THYROIDAB in the last 72 hours.  Invalid input(s): FREET3  Other results:   Imaging    DG Chest Port 1 View  Result Date:  10/30/2021 CLINICAL DATA:  Status post tricuspid valve repair, concern for pneumothorax EXAM: PORTABLE CHEST 1 VIEW COMPARISON:  11/03/2021 FINDINGS: Single frontal view of the chest demonstrates endotracheal tube overlying tracheal air column tip just below thoracic inlet. Endoscope overlies the midthoracic esophagus. Multi lead pacer/AICD unchanged. Right internal jugular catheter tip overlies the right atrium. Bilateral chest tubes are identified. Postsurgical changes from median sternotomy, with tricuspid valve annuloplasty ring identified. Cardiac silhouette remains enlarged. Occlusion device identified overlying the region of the atrial appendage, stable. No airspace disease, effusion, or pneumothorax identified on this supine exam. No acute bony abnormalities. IMPRESSION: 1. Support devices as above. 2. No evidence of pneumothorax on this supine evaluation. Electronically Signed   By: Randa Ngo M.D.   On: 10/25/2021 15:56     Medications:     Scheduled Medications:  amiodarone  200 mg Oral Daily   aspirin EC  325 mg Oral Daily   Or   aspirin  324 mg Per Tube Daily   atorvastatin  10 mg Oral q AM   bisacodyl  10 mg Oral Daily   Or   bisacodyl  10 mg Rectal Daily   buPROPion  150 mg Oral BID   chlorhexidine gluconate (MEDLINE KIT)  15 mL Mouth Rinse BID   Chlorhexidine Gluconate Cloth  6 each Topical Daily   docusate sodium  200 mg Oral Daily   mouth rinse  15 mL Mouth Rinse 10 times per day   metoprolol tartrate  12.5 mg Oral BID   Or   metoprolol tartrate  12.5 mg Per Tube BID   [START ON 11/06/2021] pantoprazole  40 mg Oral Daily   sodium chloride flush  10-40 mL Intracatheter Q12H   sodium chloride flush  3 mL Intravenous Q12H   venlafaxine XR  150 mg Oral Q breakfast    Infusions:  sodium chloride 20 mL/hr at 11/05/21 0700   sodium chloride     sodium chloride     albumin human Stopped (11/05/21 0328)   amiodarone 30 mg/hr (11/05/21 0700)    ceFAZolin (ANCEF) IV  Stopped (11/05/21 0534)   dexmedetomidine (PRECEDEX) IV infusion Stopped (11/05/21 0651)   epinephrine 5 mcg/min (11/05/21 0700)   insulin 3.6 Units/hr (11/05/21 0700)   lactated ringers 20 mL/hr at 11/05/21 0700   lactated ringers     milrinone 0.25 mcg/kg/min (11/05/21 0700)   nitroGLYCERIN Stopped (10/16/2021 1640)   norepinephrine (LEVOPHED) Adult infusion 8 mcg/min (11/05/21 0700)     PRN Medications: sodium chloride, albumin human, dextrose, fentaNYL (SUBLIMAZE) injection, lip balm, metoprolol tartrate, midazolam, ondansetron (ZOFRAN) IV, sodium chloride flush, sodium chloride flush   Assessment/Plan  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 Slow wean.  Luiz Blare not working. Remove today. - Flo Trak CO 4.2 CI 2.2 SVR 971 - Possible extubation later today  - Stop metoprolol.  - Start diuresing when ok with CT surgery.   2. Pericardial effusion: -Doubt from Watchman procedure in 12/22 -Effusion appears moderate in size on echo 02/21.  -[place] 02/22 with equalization of right and left-sided pressures with no evidence of LV-RV interaction -There was no evidence of tamponade. Suspect main issue is severe TR with markedly elevated RA pressures and inability to drain pericardium.  2. Chronic systolic CHF: - felt to have LBBB CM vs chemo induced (got adriamycin). 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 No effusion -Echo 02/21: EF 35-40%, RV severely enlarged with okay function, moderate MR, severe TR with flail posterior leaflet -RHC 02/22: RA 17, PA 33/21 (25), PCWP 18, PAPi 0.6 (in setting of severe TR), PA sat 47%, 48% - See above for pressors/flo trak  4. PAF/tachycardia - Unable to complete ablation 10/22 d/t pericardial effusion -  S/p Watchman implant 08/21/21 - Stop po amio.  - Continue amio drip.   5. 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 GI bleeding - BRBPR + melena 4-5 days prior to admission - Per her report CT at OSH with diverticulosis but no diverticulitis - - 1 u PRBCs this admit -- EGD/Colonoscopy- R colonic ischemic ulcers - suspect in the setting of low output hf., 2 polyps removed, diverticulosis. Polyps no malignancy   - On PPI   6. OSA - Unable to tolerate CPAP  7. Hypokalemia/ hypomagnesia - K /Mag stable.   8. Ischemic colitis - suspect related to mesenteric ischemia in setting of low arterial pressure and high venous outflow pressures from RV failure - No h/o ischemic colitis symptoms - Has CT abdomen from OSH (non contrast) no documentation of ab arterial calcification - Dr Haroldine Laws discussed with [facility]. Could get contrast CT abdomen to further evaluate for mesenteric disease but suspicion low given lack of RFs and/or previous symptoms.   9. VT/VF -Intraoperative. Placed on amio drip.  - Continue for now .     Length of Stay: [place], NP  11/05/2021, 7:22 AM  Advanced Heart Failure Team Pager [phone] (M-F; 7a - 5p)  Please contact [facility] for night-coverage after hours (5p -7a ) and weekends on [email or web address]   Agree with above.   S/p TVR yesterday with VT arrest when coming off pump requiring re-initiation of support. Eventually weaned. Extubated this am  On norepi 6 mcg, epi 5 mcg, milrinone 0.25 mcg.   Chest sore. Wants ice chips. Denies SOB  Swan coiled in RA   General:  Weak appearing. No resp difficulty HEENT: normal Neck: supple + Swan RIJ Carotids 2+ bilat; no bruits. No lymphadenopathy or thryomegaly appreciated. UMP:NTIRWER dressing ok Regular rate & rhythm. No rubs, gallops or murmurs. Lungs: coarse  Abdomen: soft, nontender, nondistended. No hepatosplenomegaly. No bruits or masses. Good bowel  sounds. Extremities: no cyanosis, clubbing, rash, edema Neuro: alert & orientedx3, cranial nerves grossly intact. moves all 4 extremities w/o difficulty. Affect pleasant  She is  tenuous but stable POD #1. Will reposition swan under fluoro and wean drips slowly. Watch rhythm carefully. Will need diuresis soon.   CRITICAL CARE Performed by: Glori Bickers  Total critical care time: 40 minutes  Critical care time was exclusive of separately billable procedures and treating other patients.  Critical care was necessary to treat or prevent imminent or life-threatening deterioration.  Critical care was time spent personally by me (independent of midlevel providers or residents) on the following activities: development of treatment plan with patient and/or surrogate as well as nursing, discussions with consultants, evaluation of patient's response to treatment, examination of patient, obtaining history from patient or surrogate, ordering and performing treatments and interventions, ordering and review of laboratory studies, ordering and review of radiographic studies, pulse oximetry and re-evaluation of patient's condition.  Glori Bickers, MD  12:13 PM

## 2021-11-05 NOTE — Progress Notes (Signed)
Patient ID: [name], female   DOB: 08-20-1956, 66 y.o.   MRN: [id number] ? ?TCTS Evening Rounds: ? ?Hemodynamically stable but BP low normal on milrinone 0.25, epi 10, NE 13.  ? ? ?CI 2.7, Co-ox 58 this afternoon. ? ?Extubated today. ?UO ok ?CT output low. ? ?BMET ?   ?Component Value Date/Time  ? NA 138 11/05/2021 1100  ? NA 138 09/22/2021 1201  ? K 3.7 11/05/2021 1100  ? CL 105 11/05/2021 0340  ? CO2 21 (L) 11/05/2021 0340  ? GLUCOSE 125 (H) 11/05/2021 0340  ? BUN 16 11/05/2021 0340  ? BUN 46 (H) 09/22/2021 1201  ? CREATININE 1.59 (H) 11/05/2021 0340  ? CALCIUM 8.7 (L) 11/05/2021 0340  ? EGFR 18 (L) 09/22/2021 1201  ? GFRNONAA 35 (L) 11/05/2021 0340  ? ?CBC ?   ?Component Value Date/Time  ? WBC 36.3 (H) 11/05/2021 1600  ? RBC 2.91 (L) 11/05/2021 1600  ? HGB 8.2 (L) 11/05/2021 1600  ? HGB 12.2 09/22/2021 1201  ? HCT 26.9 (L) 11/05/2021 1600  ? HCT 38.4 09/22/2021 1201  ? PLT 283 11/05/2021 1600  ? PLT 356 09/22/2021 1201  ? MCV 92.4 11/05/2021 1600  ? MCV 92 09/22/2021 1201  ? MCH 28.2 11/05/2021 1600  ? MCHC 30.5 11/05/2021 1600  ? RDW 19.9 (H) 11/05/2021 1600  ? RDW 15.2 09/22/2021 1201  ? LYMPHSABS 1.1 07/29/2021 1452  ? MONOABS 0.6 12/15/2011 1150  ? EOSABS 0.3 07/29/2021 1452  ? BASOSABS 0.1 07/29/2021 1452  ? ? ?

## 2021-11-05 DEATH — deceased

## 2021-11-06 ENCOUNTER — Inpatient Hospital Stay (HOSPITAL_COMMUNITY): Payer: Medicare PPO

## 2021-11-06 ENCOUNTER — Encounter (HOSPITAL_COMMUNITY): Payer: Self-pay | Admitting: Anesthesiology

## 2021-11-06 ENCOUNTER — Inpatient Hospital Stay (HOSPITAL_COMMUNITY): Payer: Medicare PPO | Admitting: Anesthesiology

## 2021-11-06 ENCOUNTER — Encounter (HOSPITAL_COMMUNITY): Payer: Self-pay | Admitting: Cardiothoracic Surgery

## 2021-11-06 DIAGNOSIS — J9600 Acute respiratory failure, unspecified whether with hypoxia or hypercapnia: Secondary | ICD-10-CM

## 2021-11-06 DIAGNOSIS — Z9889 Other specified postprocedural states: Secondary | ICD-10-CM | POA: Diagnosis not present

## 2021-11-06 DIAGNOSIS — J80 Acute respiratory distress syndrome: Secondary | ICD-10-CM

## 2021-11-06 DIAGNOSIS — Z9911 Dependence on respirator [ventilator] status: Secondary | ICD-10-CM

## 2021-11-06 DIAGNOSIS — I071 Rheumatic tricuspid insufficiency: Secondary | ICD-10-CM | POA: Diagnosis not present

## 2021-11-06 DIAGNOSIS — I4819 Other persistent atrial fibrillation: Secondary | ICD-10-CM | POA: Diagnosis not present

## 2021-11-06 DIAGNOSIS — I3139 Other pericardial effusion (noninflammatory): Secondary | ICD-10-CM | POA: Diagnosis not present

## 2021-11-06 LAB — CBC
HCT: 25.2 % — ABNORMAL LOW (ref 36.0–46.0)
HCT: 31.4 % — ABNORMAL LOW (ref 36.0–46.0)
Hemoglobin: 8 g/dL — ABNORMAL LOW (ref 12.0–15.0)
Hemoglobin: 9.9 g/dL — ABNORMAL LOW (ref 12.0–15.0)
MCH: 29.2 pg (ref 26.0–34.0)
MCH: 28.6 pg (ref 26.0–34.0)
MCHC: 31.7 g/dL (ref 30.0–36.0)
MCHC: 31.5 g/dL (ref 30.0–36.0)
MCV: 92 fL (ref 80.0–100.0)
MCV: 90.8 fL (ref 80.0–100.0)
Platelets: 252 10*3/uL (ref 150–400)
Platelets: 257 10*3/uL (ref 150–400)
RBC: 2.74 MIL/uL — ABNORMAL LOW (ref 3.87–5.11)
RBC: 3.46 MIL/uL — ABNORMAL LOW (ref 3.87–5.11)
RDW: 19.6 % — ABNORMAL HIGH (ref 11.5–15.5)
RDW: 19 % — ABNORMAL HIGH (ref 11.5–15.5)
WBC: 34.8 10*3/uL — ABNORMAL HIGH (ref 4.0–10.5)
WBC: 39.2 10*3/uL — ABNORMAL HIGH (ref 4.0–10.5)
nRBC: 0.2 % (ref 0.0–0.2)
nRBC: 0.5 % — ABNORMAL HIGH (ref 0.0–0.2)

## 2021-11-06 LAB — COOXEMETRY PANEL
Carboxyhemoglobin: <0.3 % — ABNORMAL LOW (ref 0.5–1.5)
Carboxyhemoglobin: 1.3 % (ref 0.5–1.5)
Carboxyhemoglobin: 1 % (ref 0.5–1.5)
Methemoglobin: 3.3 % — ABNORMAL HIGH (ref 0.0–1.5)
Methemoglobin: <0.7 % (ref 0.0–1.5)
Methemoglobin: 2 % — ABNORMAL HIGH (ref 0.0–1.5)
O2 Saturation: 46.7 %
O2 Saturation: 72.2 %
O2 Saturation: 75.1 %
Total hemoglobin: 8.4 g/dL — ABNORMAL LOW (ref 12.0–16.0)
Total hemoglobin: 7.9 g/dL — ABNORMAL LOW (ref 12.0–16.0)
Total hemoglobin: 10 g/dL — ABNORMAL LOW (ref 12.0–16.0)

## 2021-11-06 LAB — POCT I-STAT 7, (LYTES, BLD GAS, ICA,H+H)
Acid-base deficit: 3 mmol/L — ABNORMAL HIGH (ref 0.0–2.0)
Acid-base deficit: 3 mmol/L — ABNORMAL HIGH (ref 0.0–2.0)
Acid-base deficit: 10 mmol/L — ABNORMAL HIGH (ref 0.0–2.0)
Acid-base deficit: 3 mmol/L — ABNORMAL HIGH (ref 0.0–2.0)
Acid-base deficit: 1 mmol/L (ref 0.0–2.0)
Bicarbonate: 22.4 mmol/L (ref 20.0–28.0)
Bicarbonate: 23 mmol/L (ref 20.0–28.0)
Bicarbonate: 18 mmol/L — ABNORMAL LOW (ref 20.0–28.0)
Bicarbonate: 24 mmol/L (ref 20.0–28.0)
Bicarbonate: 25.9 mmol/L (ref 20.0–28.0)
Calcium, Ion: 1.25 mmol/L (ref 1.15–1.40)
Calcium, Ion: 1.23 mmol/L (ref 1.15–1.40)
Calcium, Ion: 1.13 mmol/L — ABNORMAL LOW (ref 1.15–1.40)
Calcium, Ion: 1.16 mmol/L (ref 1.15–1.40)
Calcium, Ion: 1.14 mmol/L — ABNORMAL LOW (ref 1.15–1.40)
HCT: 25 % — ABNORMAL LOW (ref 36.0–46.0)
HCT: 27 % — ABNORMAL LOW (ref 36.0–46.0)
HCT: 34 % — ABNORMAL LOW (ref 36.0–46.0)
HCT: 30 % — ABNORMAL LOW (ref 36.0–46.0)
HCT: 31 % — ABNORMAL LOW (ref 36.0–46.0)
Hemoglobin: 8.5 g/dL — ABNORMAL LOW (ref 12.0–15.0)
Hemoglobin: 9.2 g/dL — ABNORMAL LOW (ref 12.0–15.0)
Hemoglobin: 11.6 g/dL — ABNORMAL LOW (ref 12.0–15.0)
Hemoglobin: 10.2 g/dL — ABNORMAL LOW (ref 12.0–15.0)
Hemoglobin: 10.5 g/dL — ABNORMAL LOW (ref 12.0–15.0)
O2 Saturation: 65 %
O2 Saturation: 98 %
O2 Saturation: 92 %
O2 Saturation: 93 %
O2 Saturation: 93 %
Patient temperature: 98.5
Patient temperature: 36.8
Patient temperature: 37.1
Patient temperature: 37.1
Patient temperature: 36.9
Potassium: 3.9 mmol/L (ref 3.5–5.1)
Potassium: 4 mmol/L (ref 3.5–5.1)
Potassium: 3.3 mmol/L — ABNORMAL LOW (ref 3.5–5.1)
Potassium: 4 mmol/L (ref 3.5–5.1)
Potassium: 4 mmol/L (ref 3.5–5.1)
Sodium: 129 mmol/L — ABNORMAL LOW (ref 135–145)
Sodium: 131 mmol/L — ABNORMAL LOW (ref 135–145)
Sodium: 134 mmol/L — ABNORMAL LOW (ref 135–145)
Sodium: 132 mmol/L — ABNORMAL LOW (ref 135–145)
Sodium: 133 mmol/L — ABNORMAL LOW (ref 135–145)
TCO2: 24 mmol/L (ref 22–32)
TCO2: 24 mmol/L (ref 22–32)
TCO2: 19 mmol/L — ABNORMAL LOW (ref 22–32)
TCO2: 25 mmol/L (ref 22–32)
TCO2: 27 mmol/L (ref 22–32)
pCO2 arterial: 39.7 mmHg (ref 32–48)
pCO2 arterial: 47 mmHg (ref 32–48)
pCO2 arterial: 46.5 mmHg (ref 32–48)
pCO2 arterial: 49.7 mmHg — ABNORMAL HIGH (ref 32–48)
pCO2 arterial: 52.1 mmHg — ABNORMAL HIGH (ref 32–48)
pH, Arterial: 7.359 (ref 7.35–7.45)
pH, Arterial: 7.297 — ABNORMAL LOW (ref 7.35–7.45)
pH, Arterial: 7.196 — CL (ref 7.35–7.45)
pH, Arterial: 7.292 — ABNORMAL LOW (ref 7.35–7.45)
pH, Arterial: 7.305 — ABNORMAL LOW (ref 7.35–7.45)
pO2, Arterial: 35 mmHg — CL (ref 83–108)
pO2, Arterial: 115 mmHg — ABNORMAL HIGH (ref 83–108)
pO2, Arterial: 78 mmHg — ABNORMAL LOW (ref 83–108)
pO2, Arterial: 77 mmHg — ABNORMAL LOW (ref 83–108)
pO2, Arterial: 75 mmHg — ABNORMAL LOW (ref 83–108)

## 2021-11-06 LAB — POCT I-STAT EG7
Acid-base deficit: 3 mmol/L — ABNORMAL HIGH (ref 0.0–2.0)
Bicarbonate: 23.5 mmol/L (ref 20.0–28.0)
Calcium, Ion: 1.23 mmol/L (ref 1.15–1.40)
HCT: 26 % — ABNORMAL LOW (ref 36.0–46.0)
Hemoglobin: 8.8 g/dL — ABNORMAL LOW (ref 12.0–15.0)
O2 Saturation: 35 %
Potassium: 4.1 mmol/L (ref 3.5–5.1)
Sodium: 131 mmol/L — ABNORMAL LOW (ref 135–145)
TCO2: 25 mmol/L (ref 22–32)
pCO2, Ven: 47.2 mmHg (ref 44–60)
pH, Ven: 7.306 (ref 7.25–7.43)
pO2, Ven: 23 mmHg — CL (ref 32–45)

## 2021-11-06 LAB — URINALYSIS, ROUTINE W REFLEX MICROSCOPIC
Bilirubin Urine: NEGATIVE
Glucose, UA: NEGATIVE mg/dL
Hgb urine dipstick: NEGATIVE
Ketones, ur: NEGATIVE mg/dL
Nitrite: NEGATIVE
Protein, ur: 30 mg/dL — AB
Specific Gravity, Urine: 1.023 (ref 1.005–1.030)
pH: 5 (ref 5.0–8.0)

## 2021-11-06 LAB — COMPREHENSIVE METABOLIC PANEL
ALT: 11 U/L (ref 0–44)
ALT: 9 U/L (ref 0–44)
AST: 82 U/L — ABNORMAL HIGH (ref 15–41)
AST: 89 U/L — ABNORMAL HIGH (ref 15–41)
Albumin: 2.3 g/dL — ABNORMAL LOW (ref 3.5–5.0)
Albumin: 2.3 g/dL — ABNORMAL LOW (ref 3.5–5.0)
Alkaline Phosphatase: 88 U/L (ref 38–126)
Alkaline Phosphatase: 120 U/L (ref 38–126)
Anion gap: 8 (ref 5–15)
Anion gap: 10 (ref 5–15)
BUN: 20 mg/dL (ref 8–23)
BUN: 23 mg/dL (ref 8–23)
CO2: 23 mmol/L (ref 22–32)
CO2: 21 mmol/L — ABNORMAL LOW (ref 22–32)
Calcium: 8.3 mg/dL — ABNORMAL LOW (ref 8.9–10.3)
Calcium: 8.2 mg/dL — ABNORMAL LOW (ref 8.9–10.3)
Chloride: 99 mmol/L (ref 98–111)
Chloride: 100 mmol/L (ref 98–111)
Creatinine, Ser: 1.86 mg/dL — ABNORMAL HIGH (ref 0.44–1.00)
Creatinine, Ser: 1.95 mg/dL — ABNORMAL HIGH (ref 0.44–1.00)
GFR, Estimated: 29 mL/min — ABNORMAL LOW (ref >60)
GFR, Estimated: 28 mL/min — ABNORMAL LOW (ref >60)
Glucose, Bld: 113 mg/dL — ABNORMAL HIGH (ref 70–99)
Glucose, Bld: 100 mg/dL — ABNORMAL HIGH (ref 70–99)
Potassium: 3.9 mmol/L (ref 3.5–5.1)
Potassium: 4 mmol/L (ref 3.5–5.1)
Sodium: 130 mmol/L — ABNORMAL LOW (ref 135–145)
Sodium: 131 mmol/L — ABNORMAL LOW (ref 135–145)
Total Bilirubin: 0.8 mg/dL (ref 0.3–1.2)
Total Bilirubin: 1.5 mg/dL — ABNORMAL HIGH (ref 0.3–1.2)
Total Protein: 4.5 g/dL — ABNORMAL LOW (ref 6.5–8.1)
Total Protein: 4.6 g/dL — ABNORMAL LOW (ref 6.5–8.1)

## 2021-11-06 LAB — MRSA NEXT GEN BY PCR, NASAL: MRSA by PCR Next Gen: NOT DETECTED

## 2021-11-06 LAB — LACTIC ACID, PLASMA
Lactic Acid, Venous: 1.5 mmol/L (ref 0.5–1.9)
Lactic Acid, Venous: 1.5 mmol/L (ref 0.5–1.9)

## 2021-11-06 LAB — GLUCOSE, CAPILLARY
Glucose-Capillary: 111 mg/dL — ABNORMAL HIGH (ref 70–99)
Glucose-Capillary: 111 mg/dL — ABNORMAL HIGH (ref 70–99)
Glucose-Capillary: 184 mg/dL — ABNORMAL HIGH (ref 70–99)
Glucose-Capillary: 80 mg/dL (ref 70–99)
Glucose-Capillary: 82 mg/dL (ref 70–99)
Glucose-Capillary: 84 mg/dL (ref 70–99)
Glucose-Capillary: 84 mg/dL (ref 70–99)

## 2021-11-06 LAB — T4, FREE: Free T4: 0.73 ng/dL (ref 0.61–1.12)

## 2021-11-06 LAB — PREPARE RBC (CROSSMATCH)

## 2021-11-06 LAB — PROCALCITONIN: Procalcitonin: 2.49 ng/mL

## 2021-11-06 LAB — TSH: TSH: 7.134 u[IU]/mL — ABNORMAL HIGH (ref 0.350–4.500)

## 2021-11-06 MED ORDER — POTASSIUM CHLORIDE 20 MEQ PO PACK
20.0000 meq | PACK | Freq: Once | ORAL | Status: AC
  Administered 2021-11-06: 20 meq
  Filled 2021-11-06: qty 1

## 2021-11-06 MED ORDER — PROPOFOL 1000 MG/100ML IV EMUL: 0.0000 ug/kg/min | INTRAVENOUS | Status: DC

## 2021-11-06 MED ORDER — PANTOPRAZOLE 2 MG/ML SUSPENSION
40.0000 mg | Freq: Every day | ORAL | Status: DC
  Administered 2021-11-07 – 2021-11-17 (×11): 40 mg
  Filled 2021-11-06 (×11): qty 20

## 2021-11-06 MED ORDER — MIDAZOLAM HCL 2 MG/2ML IJ SOLN: 1.0000 mg | INTRAMUSCULAR | Status: DC | PRN

## 2021-11-06 MED ORDER — PANTOPRAZOLE SODIUM 40 MG IV SOLR
40.0000 mg | INTRAVENOUS | Status: DC
  Administered 2021-11-06: 40 mg via INTRAVENOUS
  Filled 2021-11-06: qty 10

## 2021-11-06 MED ORDER — METOCLOPRAMIDE HCL 5 MG/ML IJ SOLN: 10.0000 mg | Freq: Four times a day (QID) | INTRAMUSCULAR | Status: DC

## 2021-11-06 MED ORDER — VASOPRESSIN 20 UNITS/100 ML INFUSION FOR SHOCK
INTRAVENOUS | Status: AC
  Filled 2021-11-06: qty 100

## 2021-11-06 MED ORDER — ETOMIDATE 2 MG/ML IV SOLN
INTRAVENOUS | Status: AC
  Filled 2021-11-06: qty 20

## 2021-11-06 MED ORDER — ORAL CARE MOUTH RINSE
15.0000 mL | OROMUCOSAL | Status: DC
  Administered 2021-11-06 – 2021-11-17 (×111): 15 mL via OROMUCOSAL

## 2021-11-06 MED ORDER — POLYETHYLENE GLYCOL 3350 17 G PO PACK
17.0000 g | PACK | Freq: Every day | ORAL | Status: DC
  Administered 2021-11-06: 17 g
  Filled 2021-11-06: qty 1

## 2021-11-06 MED ORDER — SODIUM BICARBONATE 8.4 % IV SOLN: 50.0000 meq | Freq: Once | INTRAVENOUS | Status: AC

## 2021-11-06 MED ORDER — POTASSIUM CHLORIDE CRYS ER 20 MEQ PO TBCR
20.0000 meq | EXTENDED_RELEASE_TABLET | Freq: Once | ORAL | Status: DC
Start: 2021-11-06 — End: 2021-11-06

## 2021-11-06 MED ORDER — EPHEDRINE SULFATE (PRESSORS) 50 MG/ML IJ SOLN
INTRAMUSCULAR | Status: DC | PRN
  Administered 2021-11-06: 5 mg via INTRAVENOUS

## 2021-11-06 MED ORDER — FENTANYL CITRATE PF 50 MCG/ML IJ SOSY
PREFILLED_SYRINGE | INTRAMUSCULAR | Status: AC
  Administered 2021-11-06: 100 ug
  Filled 2021-11-06: qty 2

## 2021-11-06 MED ORDER — SODIUM BICARBONATE 8.4 % IV SOLN
INTRAVENOUS | Status: AC
  Administered 2021-11-06: 50 meq via INTRAVENOUS
  Filled 2021-11-06: qty 50

## 2021-11-06 MED ORDER — SODIUM BICARBONATE 8.4 % IV SOLN
INTRAVENOUS | Status: AC
  Administered 2021-11-06: 100 meq via INTRAVENOUS
  Filled 2021-11-06: qty 50

## 2021-11-06 MED ORDER — MIDAZOLAM BOLUS VIA INFUSION
0.0000 mg | INTRAVENOUS | Status: DC | PRN
  Filled 2021-11-06: qty 5

## 2021-11-06 MED ORDER — LIDOCAINE HCL (CARDIAC) PF 100 MG/5ML IV SOSY
PREFILLED_SYRINGE | INTRAVENOUS | Status: DC | PRN
  Administered 2021-11-06: 40 mg via INTRAVENOUS

## 2021-11-06 MED ORDER — VITAL HIGH PROTEIN PO LIQD
1000.0000 mL | ORAL | Status: DC
  Administered 2021-11-06 – 2021-11-07 (×2): 1000 mL

## 2021-11-06 MED ORDER — VANCOMYCIN HCL 2000 MG/400ML IV SOLN
2000.0000 mg | Freq: Once | INTRAVENOUS | Status: AC
  Administered 2021-11-06: 2000 mg via INTRAVENOUS
  Filled 2021-11-06: qty 400

## 2021-11-06 MED ORDER — VENLAFAXINE HCL 75 MG PO TABS
75.0000 mg | ORAL_TABLET | Freq: Two times a day (BID) | ORAL | Status: DC
  Administered 2021-11-06 – 2021-11-17 (×22): 75 mg
  Filled 2021-11-06 (×24): qty 1

## 2021-11-06 MED ORDER — METOCLOPRAMIDE HCL 5 MG/ML IJ SOLN
5.0000 mg | Freq: Four times a day (QID) | INTRAMUSCULAR | Status: DC
  Administered 2021-11-06 – 2021-11-15 (×36): 5 mg via INTRAVENOUS
  Filled 2021-11-06 (×35): qty 2

## 2021-11-06 MED ORDER — SUCCINYLCHOLINE CHLORIDE 200 MG/10ML IV SOSY
PREFILLED_SYRINGE | INTRAVENOUS | Status: AC
  Filled 2021-11-06: qty 10

## 2021-11-06 MED ORDER — BUPROPION HCL 75 MG PO TABS
75.0000 mg | ORAL_TABLET | Freq: Two times a day (BID) | ORAL | Status: DC
  Administered 2021-11-06 – 2021-11-17 (×21): 75 mg
  Filled 2021-11-06 (×24): qty 1

## 2021-11-06 MED ORDER — FENTANYL BOLUS VIA INFUSION
25.0000 ug | INTRAVENOUS | Status: DC | PRN
  Administered 2021-11-06 – 2021-11-10 (×5): 50 ug via INTRAVENOUS
  Administered 2021-11-11: 100 ug via INTRAVENOUS
  Administered 2021-11-11: 50 ug via INTRAVENOUS
  Administered 2021-11-17: 25 ug via INTRAVENOUS
  Filled 2021-11-06: qty 100

## 2021-11-06 MED ORDER — SODIUM BICARBONATE 8.4 % IV SOLN: 100.0000 meq | Freq: Once | INTRAVENOUS | Status: AC

## 2021-11-06 MED ORDER — DOCUSATE SODIUM 50 MG/5ML PO LIQD
100.0000 mg | Freq: Two times a day (BID) | ORAL | Status: DC
  Administered 2021-11-06 – 2021-11-17 (×20): 100 mg
  Filled 2021-11-06 (×20): qty 10

## 2021-11-06 MED ORDER — AMIODARONE HCL IN DEXTROSE 360-4.14 MG/200ML-% IV SOLN
INTRAVENOUS | Status: AC
  Administered 2021-11-06: 30 mg/h via INTRAVENOUS
  Filled 2021-11-06: qty 200

## 2021-11-06 MED ORDER — FENTANYL 2500MCG IN NS 250ML (10MCG/ML) PREMIX INFUSION
INTRAVENOUS | Status: AC
  Administered 2021-11-06: 50 ug/h via INTRAVENOUS
  Filled 2021-11-06: qty 250

## 2021-11-06 MED ORDER — VASOPRESSIN 20 UNITS/100 ML INFUSION FOR SHOCK
0.0000 [IU]/min | INTRAVENOUS | Status: DC
  Administered 2021-11-06: 0.04 [IU]/min via INTRAVENOUS
  Administered 2021-11-07 (×3): 0.05 [IU]/min via INTRAVENOUS
  Administered 2021-11-08: 0.03 [IU]/min via INTRAVENOUS
  Administered 2021-11-08: 0.05 [IU]/min via INTRAVENOUS
  Administered 2021-11-08: 0.03 [IU]/min via INTRAVENOUS
  Administered 2021-11-08: 0.05 [IU]/min via INTRAVENOUS
  Administered 2021-11-09 – 2021-11-11 (×6): 0.03 [IU]/min via INTRAVENOUS
  Administered 2021-11-12 (×2): 0.04 [IU]/min via INTRAVENOUS
  Administered 2021-11-12: 0.03 [IU]/min via INTRAVENOUS
  Administered 2021-11-13 – 2021-11-15 (×8): 0.04 [IU]/min via INTRAVENOUS
  Administered 2021-11-16 (×2): 0.05 [IU]/min via INTRAVENOUS
  Administered 2021-11-16: 0.04 [IU]/min via INTRAVENOUS
  Administered 2021-11-17 (×4): 0.05 [IU]/min via INTRAVENOUS
  Filled 2021-11-06 (×3): qty 100
  Filled 2021-11-06: qty 200
  Filled 2021-11-06 (×21): qty 100
  Filled 2021-11-06: qty 200
  Filled 2021-11-06 (×5): qty 100

## 2021-11-06 MED ORDER — ACETAMINOPHEN 160 MG/5ML PO SOLN
650.0000 mg | Freq: Four times a day (QID) | ORAL | Status: DC
  Administered 2021-11-06 – 2021-11-17 (×45): 650 mg
  Filled 2021-11-06 (×44): qty 20.3

## 2021-11-06 MED ORDER — VASOPRESSIN 20 UNITS/100 ML INFUSION FOR SHOCK
0.0000 [IU]/min | INTRAVENOUS | Status: DC
  Administered 2021-11-06: 0.03 [IU]/min via INTRAVENOUS
  Filled 2021-11-06: qty 100

## 2021-11-06 MED ORDER — ATORVASTATIN CALCIUM 10 MG PO TABS
10.0000 mg | ORAL_TABLET | Freq: Every morning | ORAL | Status: DC
  Administered 2021-11-07 – 2021-11-17 (×11): 10 mg
  Filled 2021-11-06 (×11): qty 1

## 2021-11-06 MED ORDER — PHENYLEPHRINE HCL (PRESSORS) 10 MG/ML IV SOLN
INTRAVENOUS | Status: DC | PRN
  Administered 2021-11-06 (×2): 40 ug via INTRAVENOUS

## 2021-11-06 MED ORDER — PROPOFOL 1000 MG/100ML IV EMUL
INTRAVENOUS | Status: AC
  Administered 2021-11-06: 10 ug/kg/min via INTRAVENOUS
  Filled 2021-11-06: qty 100

## 2021-11-06 MED ORDER — PROSOURCE TF PO LIQD
45.0000 mL | Freq: Two times a day (BID) | ORAL | Status: DC
  Administered 2021-11-06 – 2021-11-13 (×15): 45 mL
  Filled 2021-11-06 (×15): qty 45

## 2021-11-06 MED ORDER — FENTANYL CITRATE PF 50 MCG/ML IJ SOSY: 50.0000 ug | PREFILLED_SYRINGE | Freq: Once | INTRAMUSCULAR | Status: DC

## 2021-11-06 MED ORDER — FENTANYL CITRATE PF 50 MCG/ML IJ SOSY
PREFILLED_SYRINGE | INTRAMUSCULAR | Status: AC
  Administered 2021-11-06: 50 ug via INTRAVENOUS
  Filled 2021-11-06: qty 1

## 2021-11-06 MED ORDER — MIDAZOLAM HCL 2 MG/2ML IJ SOLN
INTRAMUSCULAR | Status: AC
  Administered 2021-11-06: 2 mg via INTRAVENOUS
  Filled 2021-11-06: qty 2

## 2021-11-06 MED ORDER — AMIODARONE HCL IN DEXTROSE 360-4.14 MG/200ML-% IV SOLN
30.0000 mg/h | INTRAVENOUS | Status: DC
  Administered 2021-11-07 – 2021-11-17 (×22): 30 mg/h via INTRAVENOUS
  Filled 2021-11-06 (×3): qty 200
  Filled 2021-11-06: qty 400
  Filled 2021-11-06 (×10): qty 200
  Filled 2021-11-06: qty 400
  Filled 2021-11-06 (×7): qty 200

## 2021-11-06 MED ORDER — MIDAZOLAM HCL 2 MG/2ML IJ SOLN
INTRAMUSCULAR | Status: AC
  Administered 2021-11-06: 2 mg
  Filled 2021-11-06: qty 2

## 2021-11-06 MED ORDER — MIDAZOLAM HCL 2 MG/2ML IJ SOLN: 2.0000 mg | Freq: Once | INTRAMUSCULAR | Status: AC

## 2021-11-06 MED ORDER — CHLORHEXIDINE GLUCONATE 0.12% ORAL RINSE (MEDLINE KIT)
15.0000 mL | Freq: Two times a day (BID) | OROMUCOSAL | Status: DC
  Administered 2021-11-06 – 2021-11-17 (×23): 15 mL via OROMUCOSAL

## 2021-11-06 MED ORDER — ETOMIDATE 2 MG/ML IV SOLN
INTRAVENOUS | Status: DC | PRN
  Administered 2021-11-06: 6 mg via INTRAVENOUS

## 2021-11-06 MED ORDER — SUCCINYLCHOLINE CHLORIDE 200 MG/10ML IV SOSY
PREFILLED_SYRINGE | INTRAVENOUS | Status: DC | PRN
  Administered 2021-11-06: 120 mg via INTRAVENOUS

## 2021-11-06 MED ORDER — FENTANYL CITRATE PF 50 MCG/ML IJ SOSY: 25.0000 ug | PREFILLED_SYRINGE | Freq: Once | INTRAMUSCULAR | Status: AC

## 2021-11-06 MED ORDER — MIDAZOLAM-SODIUM CHLORIDE 100-0.9 MG/100ML-% IV SOLN
0.0000 mg/h | INTRAVENOUS | Status: DC
  Administered 2021-11-06: 2 mg/h via INTRAVENOUS
  Administered 2021-11-07: 5 mg/h via INTRAVENOUS
  Administered 2021-11-08: 7 mg/h via INTRAVENOUS
  Administered 2021-11-09: 4 mg/h via INTRAVENOUS
  Administered 2021-11-10 – 2021-11-12 (×4): 5 mg/h via INTRAVENOUS
  Administered 2021-11-13 (×2): 6 mg/h via INTRAVENOUS
  Administered 2021-11-14 – 2021-11-16 (×3): 5 mg/h via INTRAVENOUS
  Administered 2021-11-17: 4 mg/h via INTRAVENOUS
  Filled 2021-11-06 (×14): qty 100

## 2021-11-06 MED ORDER — VANCOMYCIN HCL 1250 MG/250ML IV SOLN: 1250.0000 mg | INTRAVENOUS | Status: DC

## 2021-11-06 MED ORDER — FUROSEMIDE 10 MG/ML IJ SOLN
80.0000 mg | Freq: Two times a day (BID) | INTRAMUSCULAR | Status: DC
Start: 2021-11-06 — End: 2021-11-07
  Administered 2021-11-06 – 2021-11-07 (×3): 80 mg via INTRAVENOUS
  Filled 2021-11-06 (×3): qty 8

## 2021-11-06 MED ORDER — FENTANYL 2500MCG IN NS 250ML (10MCG/ML) PREMIX INFUSION
25.0000 ug/h | INTRAVENOUS | Status: DC
  Administered 2021-11-07: 125 ug/h via INTRAVENOUS
  Administered 2021-11-08 – 2021-11-09 (×2): 175 ug/h via INTRAVENOUS
  Administered 2021-11-09 – 2021-11-10 (×2): 200 ug/h via INTRAVENOUS
  Administered 2021-11-10: 175 ug/h via INTRAVENOUS
  Administered 2021-11-11 – 2021-11-13 (×5): 200 ug/h via INTRAVENOUS
  Administered 2021-11-14: 150 ug/h via INTRAVENOUS
  Administered 2021-11-14: 200 ug/h via INTRAVENOUS
  Administered 2021-11-15: 175 ug/h via INTRAVENOUS
  Administered 2021-11-16: 150 ug/h via INTRAVENOUS
  Administered 2021-11-17 (×2): 100 ug/h via INTRAVENOUS
  Filled 2021-11-06 (×17): qty 250

## 2021-11-06 MED ORDER — PIPERACILLIN-TAZOBACTAM 3.375 G IVPB
3.3750 g | Freq: Three times a day (TID) | INTRAVENOUS | Status: DC
  Administered 2021-11-06 – 2021-11-08 (×7): 3.375 g via INTRAVENOUS
  Filled 2021-11-06 (×6): qty 50

## 2021-11-06 MED ORDER — KETAMINE HCL 50 MG/5ML IJ SOSY
PREFILLED_SYRINGE | INTRAMUSCULAR | Status: AC
  Filled 2021-11-06: qty 5

## 2021-11-06 MED ORDER — ROCURONIUM BROMIDE 10 MG/ML (PF) SYRINGE
PREFILLED_SYRINGE | INTRAVENOUS | Status: AC
  Filled 2021-11-06: qty 10

## 2021-11-06 MED ORDER — SODIUM CHLORIDE 0.9 % IV SOLN
INTRAVENOUS | Status: DC | PRN
Start: 2021-11-06 — End: 2021-11-18

## 2021-11-06 MED FILL — Magnesium Sulfate Inj 50%: INTRAMUSCULAR | Qty: 10 | Status: AC

## 2021-11-06 MED FILL — Heparin Sodium (Porcine) Inj 1000 Unit/ML: Qty: 1000 | Status: AC

## 2021-11-06 MED FILL — Potassium Chloride Inj 2 mEq/ML: INTRAVENOUS | Qty: 40 | Status: AC

## 2021-11-06 NOTE — Progress Notes (Addendum)
Advanced Heart Failure Rounding Note  PCP-Cardiologist: None   Subjective:   02/20: Transferred to [facility] for GI bleed + pericardial effusion 02/22: R/LHC with normal cors, equalized R and L sided pressures with no evidence of LV-RV interaction, RA 17, PA 33/21 (25), Fick CO/CI 4.2/2.2, severely reduced PAPi (in setting of severe TR), PA sat 47%, 48% 2/24: S/P Two polyps in the distal ascending colon, removed with a cold snare. Resected and retrieved. Right colonic ischemic ulcers (healing, no active bleeding, biopsied)-likely etiology of LGI. Moderate sigmoid diverticulosis. 2/25: Diuresed with IV lasix.  2/26: Switched to torsemide 60 /60 .  2/28: S/P TVR . Off pump had VT/VT when PA catheter placed. Had cardiac massage. Cardioverted x2. Unsuccessful. Placed back on pump with restoration of SR. Started on IV amiodarone.  3/1: Extubated 3/2: Reintubated for acute hypoxic respiratory failure. Stat CXR ? Possible b/l PNA  This morning, remains intubated, FiO2 100%. Awake on vent and following commands.   Higher pressor requirements today. On Milrinone 0.25, NE 18, Epi 10. Early AM co-ox low at 47% but in setting of severe hypoxia, just prior to intubation. Repeat Co-ox pending.   Cuff MAPs upper 60s. No A-line.   Swan #s CVP 10 PAP 37/18 (24) CO 4.91 CI 2.5 PAPi 1.9 (improving, initially 0.6)  Co-ox pending   Afebrile. WBC 24>>36>>34K. PCT level pending.   Wt up 22 lb from pre-op wt. CVP 10. Poor response to IV Lasix yesterday, only 675 cc in UOP.  Has AKI. SCr 1.94>>1.86 today (baseline ~1.3)  Hgb 8.0.  CT output 840 cc    Objective:   Weight Range: 99.5 kg Body mass index is 40.12 kg/m.   Vital Signs:   Temp:  [97.5 F (36.4 C)-100.8 F (38.2 C)] 97.5 F (36.4 C) (03/02 0700) Pulse Rate:  [65-137] 89 (03/02 0700) Resp:  [10-34] 19 (03/02 0700) BP: (66-181)/(27-160) 105/45 (03/02 0700) SpO2:  [75 %-99 %] 99 % (03/02 0700) Arterial Line BP: (52-108)/(37-87) 52/42  (03/01 1600) FiO2 (%):  [100 %] 100 % (03/02 0631) Weight:  [99.5 kg] 99.5 kg (03/02 0500) Last BM Date : 11/02/21  Weight change: Filed Weights   10/22/2021 0503 11/05/21 0400 11/06/21 0500  Weight: 85.1 kg 95.5 kg 99.5 kg    Intake/Output:   Intake/Output Summary (Last 24 hours) at 11/06/2021 0840 Last data filed at 11/06/2021 0700 Gross per 24 hour  Intake 2522.13 ml  Output 785 ml  Net 1737.13 ml      Physical Exam   CVP 10  General:  ill appearing WF, intubated, awake on vent and following commands, slightly diaphoretic  HEENT: normal + ETT  Neck: supple. JVD to jaw. + rt IJ swan cath, Carotids 2+ bilat; no bruits. No lymphadenopathy or thyromegaly appreciated. Cor: PMI nondisplaced. Regular rate & rhythm. No rubs, gallops or murmurs. Sternotomy site ok  Lungs: intubated, decreased BS bilaterally  Abdomen: soft, nontender, nondistended. No hepatosplenomegaly. No bruits or masses. Good bowel sounds. Extremities: no cyanosis, clubbing, rash, trace b/l LE edema, legs cool, b/l arms + non blanching petechiae  Neuro: intubated, awake on vent and following commands    Telemetry  AV paced 90s. No further VT /VF   Labs    CBC Recent Labs    11/05/21 1600 11/06/21 0449 11/06/21 0455 11/06/21 0502  WBC 36.3*  --   --  34.8*  HGB 8.2*   < > 8.5* 8.0*  HCT 26.9*   < > 25.0* 25.2*  MCV  92.4  --   --  92.0  PLT 283  --   --  252   < > = values in this interval not displayed.   Basic Metabolic Panel Recent Labs    11/05/21 0340 11/05/21 0345 11/05/21 1600 11/05/21 2103 11/06/21 0449 11/06/21 0455 11/06/21 0502  NA 136   < >  --  127*   < > 129* 130*  K 3.9   < >  --  3.7   < > 3.9 3.9  CL 105  --   --  95*  --   --  99  CO2 21*  --   --  22  --   --  23  GLUCOSE 125*  --   --  272*  --   --  113*  BUN 16  --   --  18  --   --  20  CREATININE 1.59*  --   --  1.94*  --   --  1.86*  CALCIUM 8.7*  --   --  7.8*  --   --  8.3*  MG 2.9*  --  2.8*  --   --   --   --     < > = values in this interval not displayed.   Liver Function Tests Recent Labs    11/01/2021 0500 11/06/21 0502  AST 26 82*  ALT 19 11  ALKPHOS 90 88  BILITOT 0.4 0.8  PROT 5.2* 4.5*  ALBUMIN 2.2* 2.3*   No results for input(s): LIPASE, AMYLASE in the last 72 hours. Cardiac Enzymes No results for input(s): CKTOTAL, CKMB, CKMBINDEX, TROPONINI in the last 72 hours.  BNP: BNP (last 3 results) Recent Labs    02/24/21 1357 07/16/21 1145 10/23/2021 2138  BNP 326.4* 523.7* 477.9*    ProBNP (last 3 results) No results for input(s): PROBNP in the last 8760 hours.   D-Dimer No results for input(s): DDIMER in the last 72 hours. Hemoglobin A1C No results for input(s): HGBA1C in the last 72 hours. Fasting Lipid Panel No results for input(s): CHOL, HDL, LDLCALC, TRIG, CHOLHDL, LDLDIRECT in the last 72 hours.  Thyroid Function Tests Recent Labs    11/06/21 0502  TSH 7.134*    Other results:   Imaging    DG CHEST PORT 1 VIEW  Result Date: 11/06/2021 CLINICAL DATA:  Endotracheal tube placement. EXAM: PORTABLE CHEST 1 VIEW COMPARISON:  11/06/2021 FINDINGS: 0625 hours. Endotracheal tube tip is 2.0 cm above the base of the carina. Right IJ pulmonary artery catheter tip is in the interlobar pulmonary artery. Left-sided chest tube evident with mediastinal/pericardial drain at the midline. No evidence for pneumothorax patchy bilateral airspace disease, right greater than left, is similar to prior. The cardio pericardial silhouette is enlarged. Left-sided pacer/AICD again noted. Telemetry leads overlie the chest. IMPRESSION: 1. Endotracheal tube tip is 2.0 cm above the base of the carina. 2. Stable patchy bilateral airspace disease, right greater than left. Electronically Signed   By: Misty Stanley M.D.   On: 11/06/2021 06:35   DG Chest Port 1 View  Result Date: 11/06/2021 CLINICAL DATA:  66 year old female with increasing oxygen requirement. Postoperative day 2 status post tricuspid  valve repair. EXAM: PORTABLE CHEST 1 VIEW COMPARISON:  Portable chest 11/05/2021 and earlier. FINDINGS: Portable AP semi upright view at 0526 hours. Extubated. Enteric tube removed. Right IJ approach Swan-Ganz catheter tip at the right pulmonary hilum. Stable bilateral chest tubes and mediastinal tube. Stable right chest AICD. Metallic mesh  type device projects over the left cardiac silhouette, resembling ASD or VSD occluded device. Stable tricuspid valve. Stable cardiac size and mediastinal contours. Lower lung volumes. Patchy and confluent multifocal new lung opacity. Right upper and lower lung involvement. Left perihilar involvement. No pneumothorax identified. New veiling opacity at the left lung base suggesting pleural effusion. Negative visible bowel gas. IMPRESSION: 1. Extubated and enteric tube removed. Otherwise satisfactory lines and tubes. 2. Lower lung volumes with confluent new right greater than left lung opacity more resembles airspace disease than pulmonary edema. Consider Aspiration. 3. No pneumothorax identified. Left pleural effusion suspected. Stable mediastinal contours. Electronically Signed   By: Genevie Ann M.D.   On: 11/06/2021 05:48   DG C-Arm 1-60 Min-No Report  Result Date: 11/05/2021 Fluoroscopy was utilized by the requesting physician.  No radiographic interpretation.     Medications:     Scheduled Medications:  acetaminophen  650 mg Oral Q6H   aspirin EC  325 mg Oral Daily   Or   aspirin  324 mg Per Tube Daily   atorvastatin  10 mg Oral q AM   bisacodyl  10 mg Oral Daily   Or   bisacodyl  10 mg Rectal Daily   buPROPion  75 mg Oral BID   chlorhexidine gluconate (MEDLINE KIT)  15 mL Mouth Rinse BID   Chlorhexidine Gluconate Cloth  6 each Topical Daily   docusate sodium  200 mg Oral Daily   etomidate       furosemide  40 mg Intravenous Daily   insulin aspart  0-24 Units Subcutaneous Q4H   insulin detemir  10 Units Subcutaneous BID   mouth rinse  15 mL Mouth Rinse 10  times per day   pantoprazole  40 mg Oral Daily   potassium chloride  20 mEq Oral Once   sodium chloride flush  10-40 mL Intracatheter Q12H   sodium chloride flush  3 mL Intravenous Q12H   succinylcholine       venlafaxine XR  150 mg Oral Q breakfast    Infusions:  sodium chloride 20 mL/hr at 11/06/21 0700   sodium chloride     sodium chloride 10 mL/hr at 11/06/21 0321   amiodarone 30 mg/hr (11/06/21 0700)   epinephrine 10 mcg/min (11/06/21 0700)   lactated ringers     milrinone 0.25 mcg/kg/min (11/06/21 0755)   norepinephrine (LEVOPHED) Adult infusion 18 mcg/min (11/06/21 0700)     PRN Medications: sodium chloride, fentaNYL (SUBLIMAZE) injection, lip balm, metoprolol tartrate, ondansetron (ZOFRAN) IV, sodium chloride flush, sodium chloride flush     Patient Profile    66 y.o. female with h/o PAF with recent failed ablation S/p Watchman implant 08/21/21,  breast CA in 1998 s/p left breast surgery and s/p Adriamycin x 4 cycles, non-ischemic cardiomyopathy/chronic systolic CHF, LBBB, s/p BiV Medtronic ICD, HTN, OSA, and CKD Stage IIIb. Transferred from OSH for acute GIB and pericardial effusion. Initial Echo here showed EF 35-40% RV severely enlarged w/ low normal systolic fx. Moderate posterior effusion, no tamponade. Severe TR noted. R/LCH showed normal cors, LVEF 40%, Equalized right and left-sided pressure with no evidence of LV-RV interaction and evidence of severe RV failure w/ severely reduced PAPi, 0.6, in setting of severe TR. Went to OR 2/28 for TVR. Off pump had VT/VF when PA catheter placed. Had cardiac massage. Cardioverted x2. Unsuccessful. Placed back on pump with restoration of SR. Started on IV amiodarone.   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 Acute on Chronic Systolic CHF w/ Post Cardiotomy Shock  - felt to have LBBB CM vs chemo induced (got adriamycin). 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 No effusion -Echo 02/21: EF 35-40%, RV severely enlarged with okay function, moderate MR, severe TR with flail posterior leaflet -RHC 02/22: RA 17, PA 33/21 (25), PCWP 18, PAPi 0.6 (in setting of severe TR), PA sat 47%, 48% -S/p TVR 2/28. Intraop TEE EF 40-45%, RV mildly reduced.  VT arrest when coming off pump  -Today on Milrinone 0.25, NE 18 and Epi 10. CO ok, CI 2.5. PAPi improving, 1.9. Co-ox pending  - Fluid overloaded, wt up 22 lb from pre-op, CVP 10  - Increase IV Lasix 80 mg bid, monitor response  - Wean pressors as tolerated, try to wean NE first.  Continue Milrinone and Epi for RV support   3. Acute Hypoxic Respiratory Failure - Extubated 3/1 - Reintubated 3/2. CXR ? B/l PNA  - WBC 24>>36>>34K. Start empiric abx, Vanc + zosyn  - Check PCT, respiratory and blood cx  - Needs diuresis, see above   4. Pericardial effusion: -Doubt from Watchman procedure in 12/22 -Effusion appears moderate in size on echo 02/21.  -Mora 02/22 with equalization of right and left-sided pressures with no evidence of LV-RV interaction -There was no evidence of tamponade. Suspect main issue is severe TR with markedly elevated RA pressures and inability to drain pericardium.  5. PAF/tachycardia - Unable to complete ablation 10/22 d/t pericardial effusion - S/p Watchman implant 08/21/21 - continue amio gtt while on inotropes/ pressors   6. AKI on CKD IIIb/IV - Scr peaked at 2.8-->today 1.6 -->1.9 today  - In setting of GI bleed and hypotension +/- cardiorenal  - Increase IV Lasix to 80 mg and monitor response - continue pressors to support BP, aim to keep MAPs >70 and SBP >110   6. GI bleeding - BRBPR + melena 4-5 days prior to admission - Per her report CT at OSH with diverticulosis but no  diverticulitis - 1 u PRBCs this admit -EGD/Colonoscopy- R colonic ischemic ulcers - suspect in the setting of low output hf., 2 polyps removed, diverticulosis. Polyps no malignancy   - On PPI  - Follow H/Hg, hgb stable 8.0   7. OSA - Unable to tolerate CPAP  8. Hypokalemia/ hypomagnesia - K /Mag stable.   9. Ischemic colitis - suspect related to mesenteric ischemia in setting of low arterial pressure and high venous outflow pressures from RV failure - No h/o ischemic colitis symptoms - Has CT abdomen from OSH (non contrast) no documentation of ab arterial calcification  10. VT/VF -Intraoperative. Placed on amio drip.  -stable, no recurrence  -continue amio gtt -ceep K >4.0 and Mg >2.0    Length of Stay: [address], PA-C  11/06/2021, 8:40 AM  Advanced Heart Failure Team Pager [phone] (M-F; 7a - 5p)  Please contact [facility] for night-coverage after hours (5p -7a ) and weekends on [email or web address]  Agree with above.   She remains critically ill. Intubated overnight with possible aspiration. Now with bilateral infiltrates R > L. On significant pressors/inotropes. Hemodynamics don't look to bad but on a lot of support.   Rhythm stable  General:  Intubated. Awake on vent  HEENT: normal Neck: supple. RIJ swan Carotids 2+ bilat; no bruits. No lymphadenopathy or thryomegaly appreciated. Cor: Sternal dressing ok Regular rate & rhythm. No rubs, gallops or murmurs. Lungs: + crackles Abdomen: soft, nontender, + mildly distended. No hepatosplenomegaly. No bruits or masses. Good bowel sounds. Extremities: no cyanosis, clubbing, rash, tr edema Neuro: awake on vent. Follows commands  She is critically ill. Now reintubated with possible aspiration vs edema. Agree with expanding abx. I have consulted CCM for assistance. Diurese as tolerated. Adjust pressors based on BP/Swan  D/w Drs. Prescott Gum and Oliver.   Total time spent 45 minutes. Over half that time spent discussing  above.   Glori Bickers, MD  10:05 AM

## 2021-11-06 NOTE — Progress Notes (Signed)
? ?  Remains very tenuous on high-dose pressors - Epi, NE and VP. ? ?ABGs with persistent metabolic acidosis. Given several rounds of bicarb throughout the evening. ? ?Most recent swan numbers  ? ?RA 9 ?PA 41/17 (25) ?Thermo 5.0/2.5 ?SVR 822 ? ?VP increased to 0.05.  ? ?Will continue to titrate drips. Continue broad spectrum abx for septic physiology. Hold diuretics. Suspect she will likely have progressive AKI and may need CVVHD.  ? ?Additional CCT 45 min ? ?Glori Bickers, MD  ?9:50 PM ? ? ?

## 2021-11-06 NOTE — Transfer of Care (Signed)
Immediate Anesthesia Transfer of Care Note ? ?Patient: [name] ? ?Procedure(s) Performed: AN AD HOC INTUBATION ? ?Patient Location: ICU ? ?Anesthesia Type:General ? ?Level of Consciousness: sedated ? ?Airway & Oxygen Therapy: Patient remains intubated per anesthesia plan and Patient placed on Ventilator (see vital sign flow sheet for setting) ? ?Post-op Assessment: Report given to RN and Post -op Vital signs reviewed and stable ? ?Post vital signs: Reviewed and stable ? ?Last Vitals:  ?Vitals Value Taken Time  ?BP 103/31 11/06/21 0636  ?Temp 36.5 ?C 11/06/21 0637  ?Pulse 89 11/06/21 0637  ?Resp 21 11/06/21 0637  ?SpO2 96 % 11/06/21 0637  ?Vitals shown include unvalidated device data. ? ?Last Pain:  ?Vitals:  ? 11/06/21 0329  ?TempSrc:   ?PainSc: Asleep  ?   ? ?  ? ?Complications: No notable events documented. ?

## 2021-11-06 NOTE — Anesthesia Procedure Notes (Signed)
Procedure Name: Intubation ?Date/Time: 11/06/2021 6:20 AM ?Performed by: Clovis Cao, CRNA ?Pre-anesthesia Checklist: Patient identified, Emergency Drugs available, Suction available and Patient being monitored ?Patient Re-evaluated:Patient Re-evaluated prior to induction ?Oxygen Delivery Method: Ambu bag ?Preoxygenation: Pre-oxygenation with 100% oxygen ?Induction Type: IV induction ?Ventilation: Mask ventilation without difficulty ?Laryngoscope Size: Glidescope and 4 ?Grade View: Grade I ?Tube type: Oral ?Tube size: 8.0 mm ?Number of attempts: 1 ?Airway Equipment and Method: Stylet and Video-laryngoscopy ?Placement Confirmation: ETT inserted through vocal cords under direct vision, breath sounds checked- equal and bilateral and CO2 detector ?Secured at: 21 cm ?Tube secured with: Tape ?Dental Injury: Teeth and Oropharynx as per pre-operative assessment  ? ? ? ? ?

## 2021-11-06 NOTE — Progress Notes (Signed)
Pharmacy Antibiotic Note ? ?[name] is a 67 y.o. female admitted on 10/18/2021 with  aspiration pneumonia .  Pharmacy has been consulted for vancomycin and Zosyn dosing. ? ?Patient s/p TVR 2/28 extubated on 3/1. This morning developed increasing hypoxia requiring reintubation, increasing pressor requirements, WBCs elevated at 34, afebrile now with concern for aspiration PNA.  ? ?Discussed ability to switch Zosyn to cefepime/metronidazole given patient's EF and volume overload however, provider wanted to continue with Zosyn. ? ?Plan: ?Vancomycin 2gIV x1 then 1250 mg IV q48h (est AUC 530) ?Zosyn 3.375 q8h EI ?F/u MRSA PCR, and ability to discontinue vancomycin  ?F/u PCR and trend ?F/u sputum cultures, blood cultures and ability to de-escalate and LOT ?Monitor fever curve, WBCs, and s/x of infection ? ?Height: 5\' 2"  (157.5 cm) ?Weight: 99.5 kg (219 lb 5.7 oz) (Double checked) ?IBW/kg (Calculated) : 50.1 ? ?Temp (24hrs), Avg:98.7 ?F (37.1 ?C), Min:97.5 ?F (36.4 ?C), Max:100.8 ?F (38.2 ?C) ? ?Recent Labs  ?Lab 10/10/2021 ?1422 10/20/2021 ?1625 10/13/2021 ?2145 11/05/21 ?0340 11/05/21 ?1600 11/05/21 ?2103 11/06/21 ?0502  ?WBC  --  28.9* 28.4* 24.2* 36.3*  --  34.8*  ?CREATININE 1.20*  --  1.67* 1.59*  --  1.94* 1.86*  ?  ?Estimated Creatinine Clearance: 32.4 mL/min (A) (by C-G formula based on SCr of 1.86 mg/dL (H)).   ? ?Allergies  ?Allergen Reactions  ? Buprenorphine Hcl Shortness Of Breath and Other (See Comments)  ?  Patient required emergent use of naloxone and naloxone drip in ICU for only taking Percocet 5-325 over the course of one day. She appears to have impaired metabolism of opioid medications and providers should use these medications with extreme caution.  ? Morphine And Related Shortness Of Breath, Rash and Other (See Comments)  ?  Patient required emergent use of naloxone and naloxone drip in ICU for only taking Percocet 5-325 over the course of one day. She appears to have impaired metabolism of  opioid medications and providers should use these medications with extreme caution.  ?Mental status changes, also ?  ? Oxycodone Shortness Of Breath and Other (See Comments)  ?  Mental status changes and ?Patient required emergent use of naloxone and naloxone drip in ICU for only taking Percocet 5-325 over the course of one day. She appears to have impaired metabolism of opioid medications and providers should use these medications with extreme caution.   ? Percocet [Oxycodone-Acetaminophen] Shortness Of Breath and Other (See Comments)  ?  Hospitalized-  ?Patient required emergent use of naloxone and naloxone drip in ICU for only taking Percocet 5-325 over the course of one day. She appears to have impaired metabolism of opioid medications and providers should use these medications with extreme caution.   ? ? ?Antimicrobials this admission: ?3/2 vancomycin >>  ?3/2 Zosyn >>  ?2/28 cefazolin >> 3/2 (post-op) ? ?Dose adjustments this admission: ?N/A ? ?Microbiology results: ?3/2 BCx: sent ?3/2 Sputum: sent  ?3/2 MRSA PCR: IP ? ?Thank you for allowing pharmacy to be a part of this patient?s care. ? ?Eduard Clos Tishawn Friedhoff ?11/06/2021 9:19 AM ? ?

## 2021-11-06 NOTE — Procedures (Signed)
Arterial Catheter Insertion Procedure Note ? ?[name]  ?[id number]  ?1955/09/14 ? ?Date:11/06/21  ?Time:3:03 PM  ? ? ?Provider Performing: Jacky Kindle  ? ? ?Procedure: Insertion of Arterial Line [phone]) with US guidance (78588)  ? ?Indication(s) ?Blood pressure monitoring and/or need for frequent ABGs ? ?Consent ?Risks of the procedure as well as the alternatives and risks of each were explained to the patient and/or caregiver.  Consent for the procedure was obtained and is signed in the bedside chart ? ?Anesthesia ?None ? ? ?Time Out ?Verified patient identification, verified procedure, site/side was marked, verified correct patient position, special equipment/implants available, medications/allergies/relevant history reviewed, required imaging and test results available. ? ? ?Sterile Technique ?Maximal sterile technique including full sterile barrier drape, hand hygiene, sterile gown, sterile gloves, mask, hair covering, sterile ultrasound probe cover (if used). ? ? ?Procedure Description ?Area of catheter insertion was cleaned with chlorhexidine and draped in sterile fashion. With real-time ultrasound guidance an arterial catheter was placed into the right  Axillary  artery.  Appropriate arterial tracings confirmed on monitor.   ? ? ?Complications/Tolerance ?None; patient tolerated the procedure well. ? ? ?EBL ?Minimal ? ? ?Specimen(s) ?None ? ?

## 2021-11-06 NOTE — Progress Notes (Signed)
2 Days Post-Op Procedure(s) (LRB): ?TRICUSPID VALVE REPAIR USING EDWARDS MC3 TRICUSPID ANNULOPLASTY RING SIZE 28MM (N/A) ?TRANSESOPHAGEAL ECHOCARDIOGRAM (TEE) (N/A) ?Subjective: ?Intubated early am after O2 sats progressively dropped overnight CXR c/w aspiration pneumonia and WBC 35k ?She is responsive on vent on Fio2 100%. ?AV paced, last CI 2.5 and CVP10 ?Creatinine increasing, hx preop chronic renal disease ? ?Objective: ?Vital signs in last 24 hours: ?Temp:  [97.5 ?F (36.4 ?C)-100.4 ?F (38 ?C)] 97.5 ?F (36.4 ?C) (03/02 0845) ?Pulse Rate:  [65-91] 89 (03/02 0845) ?Cardiac Rhythm: Ventricular paced (03/02 0800) ?Resp:  [10-34] 25 (03/02 0845) ?BP: (66-120)/(27-81) 115/50 (03/02 0831) ?SpO2:  [76 %-99 %] 89 % (03/02 0845) ?Arterial Line BP: (52-98)/(37-87) 52/42 (03/01 1600) ?FiO2 (%):  [100 %] 100 % (03/02 0831) ?Weight:  [99.5 kg] 99.5 kg (03/02 0500) ? ?Hemodynamic parameters for last 24 hours: ?PAP: (22-50)/(10-29) 36/15 ?CVP:  [6 mmHg-28 mmHg] 7 mmHg ?PCWP:  [29 mmHg] 29 mmHg ?CO:  [4.9 L/min-5.3 L/min] 4.9 L/min ?CI:  [2.5 L/min/m2-2.7 L/min/m2] 2.5 L/min/m2 ? ?Intake/Output from previous day: ?03/01 0701 - 03/02 0700 ?In: 2624.5 [P.O.:240; I.V.:2184.4; IV Piggyback:200.1] ?Out: 840 [Urine:675; Chest Tube:165] ?Intake/Output this shift: ?Total I/O ?In: 279.9 [I.V.:179.8; IV Piggyback:100.1] ?Out: -  ? ?Alert and responsive on vent ?Coarse breath sounds R>L ?No cardiac murmur ?No air leak from chest tubes ? ?Lab Results: ?Recent Labs  ?  11/05/21 ?1600 11/06/21 ?0449 11/06/21 ?0455 11/06/21 ?0502  ?WBC 36.3*  --   --  34.8*  ?HGB 8.2*   < > 8.5* 8.0*  ?HCT 26.9*   < > 25.0* 25.2*  ?PLT 283  --   --  252  ? < > = values in this interval not displayed.  ? ?BMET:  ?Recent Labs  ?  11/05/21 ?2103 11/06/21 ?0449 11/06/21 ?0455 11/06/21 ?0502  ?NA 127*   < > 129* 130*  ?K 3.7   < > 3.9 3.9  ?CL 95*  --   --  99  ?CO2 22  --   --  23  ?GLUCOSE 272*  --   --  113*  ?BUN 18  --   --  20  ?CREATININE 1.94*  --   --   1.86*  ?CALCIUM 7.8*  --   --  8.3*  ? < > = values in this interval not displayed.  ?  ?PT/INR:  ?Recent Labs  ?  11/05/21 ?0356  ?LABPROT 17.3*  ?INR 1.4*  ? ?ABG ?   ?Component Value Date/Time  ? PHART 7.359 11/06/2021 0455  ? HCO3 22.4 11/06/2021 0455  ? TCO2 24 11/06/2021 0455  ? ACIDBASEDEF 3.0 (H) 11/06/2021 0455  ? O2SAT 72.2 11/06/2021 1007  ? ?CBG (last 3)  ?Recent Labs  ?  11/05/21 ?2352 11/06/21 ?0448 11/06/21 ?0753  ?GLUCAP 111* 111* 111*  ? ? ?Assessment/Plan: ?S/P Procedure(s) (LRB): ?TRICUSPID VALVE REPAIR USING EDWARDS MC3 TRICUSPID ANNULOPLASTY RING SIZE 28MM (N/A) ?TRANSESOPHAGEAL ECHOCARDIOGRAM (TEE) (N/A) ?Culture tracheal aspirate , empiric antibiotics ?NG suction, reglan ?CCM consult for vent management ?Cautions diuresis ?! Unit PRBC  for expected postop anemia ?Discussed plan with Dr Haroldine Laws for coordination of care ? ? ? LOS: 10 days  ? ? ?[name] ?11/06/2021 ?  ?

## 2021-11-06 NOTE — Progress Notes (Signed)
Was CTB given increased pressor requirements. NE now up to 47. MAP 59 on my arrival. Remains intubated, RN attempting to wean sedation. ? ?Currently on Milrinone 0.25, NE 47, Epi 10, VP 0.03.  ? ?Swan #s ?CVP 13 ?PAP 43/20 (27) ?CO 5.07 ?CI 2.6 ?SVR 742 ?PAPi 1.8  ? ?Concern for developing septic shock w/ low SVR. WBC 35 on am labs. PCT 2.49.  Started on empiric abx w/ vanc + zosyn. Blood and respiratory cultures pending.  ? ?Now oliguric, only 75 cc in UOP despite IV Lasix  ? ?Recheck stat CBC, CMP and lactic acid. Check UA/UCx  ? ?Stop Milrinone w/ low SVR. Increase VP 0.04. Adjust NE ceiling to 60 and EPi ceiling to 20.  ? ?Continue vanc + zosyn. Awaiting culture data.  ? ?Continue to follow swan hemodynamics. Recheck CO/CI in 3 hrs post milrinone discontinuation.  ? ?Aim to keep MAPs closer to 70 to help w/ renal perfusion.  ? ?Discussed plan w/ RN.  ? ?Lyda Jester, PA-C  ? ? ?

## 2021-11-06 NOTE — Consult Note (Signed)
NAME:  [name], MRN:  [id number], DOB:  01-29-1956, LOS: 70 ADMISSION DATE:  11/02/2021, CONSULTATION DATE:  11/06/21 REFERRING MD:  Nils Pyle, CHIEF COMPLAINT:  hypoxia   History of Present Illness:  [name] is a 66 year old woman with a history of HFrEF and paroxysmal atrial fibrillation who was transferred to [facility] with GI bleed and pericardial effusion.  She had recently undergone watchman insertion in January 2023.  She underwent EGD which did not explain bleeding.  Colonoscopy demonstrated sessile polyps, which were removed in the ascending colon and areas of ischemic colonic ulcers.  There was moderate sigmoid diverticulosis.  Heart catheterization demonstrated normal coronary arteries with equalization of left and right-sided heart pressures, severely reduced PAPi due to severe tricuspid regurgitation.  Swan-Ganz catheter was left in place to assist with weaning inotropic support.  Echocardiograms have previously demonstrated reduced RV function and dilation. On 2/28 she underwent Tricuspid repair and ring annuplasty. Post-op day 1 she was extubated but developed rapidly worsening respiratory failure overnight on 3/2 requiring re-intubation by anesthesia. CXR post-intubation had bilateral infiltrates.  Pertinent  Medical History  HFrEF 2/2 NICM with AICD Paroxysmal Afib, recent failed ablation OSA 3b CKD Breast cancer-1998, s/p surgery, adriamycin  Significant Hospital Events: Including procedures, antibiotic start and stop dates in addition to other pertinent events    2/20 admitted for pericardial effusion without tamponade 2/22 L&R heart cath 2/24 EGD & colonoscopy 2/28 tricuspid valve repair; Vt arrest coming off bypass, back on bypass to stabilize 3/1 extubated post op, reintubated overnight for hypoxia 3/2 PCCM consulted for vent management  Interim History / Subjective:    Objective   Blood pressure (!) 70/64, pulse 89, temperature 98.1 F (36.7  C), temperature source Core (Comment), resp. rate (!) 22, height 5\' 2"  (1.575 m), weight 99.5 kg, SpO2 (!) 89 %. PAP: (22-50)/(10-29) 36/15 CVP:  [6 mmHg-28 mmHg] 7 mmHg PCWP:  [29 mmHg] 29 mmHg CO:  [4.9 L/min-5.3 L/min] 4.9 L/min CI:  [2.5 L/min/m2-2.7 L/min/m2] 2.5 L/min/m2  Vent Mode: PRVC FiO2 (%):  [100 %] 100 % Set Rate:  [18 bmp-20 bmp] 20 bmp Vt Set:  [400 mL-560 mL] 400 mL PEEP:  [5 [id number] cmH20] 12 cmH20 Plateau Pressure:  [20 TSV77-93 cmH20] 28 cmH20   Intake/Output Summary (Last 24 hours) at 11/06/2021 1057 Last data filed at 11/06/2021 0900 Gross per 24 hour  Intake 2616.37 ml  Output 635 ml  Net 1981.37 ml   Filed Weights   10/14/2021 0503 11/05/21 0400 11/06/21 0500  Weight: 85.1 kg 95.5 kg 99.5 kg    Examination: General: critically ill appearing woman lying in bed in NAD HENT: [place]/AT, eyes anicteric Lungs: breathing comfortably on MV, rhales bilaterally Cardiovascular: S1S2, reg rate, paced rhythm Abdomen: obese, mildly distended, NT Extremities: +edema, no cyanosis Neuro: RASS -4, breathing over vent GU: foley draining light yellow urine  ABG    Component Value Date/Time   PHART 7.359 11/06/2021 0455   PCO2ART 39.7 11/06/2021 0455   PO2ART 35 (LL) 11/06/2021 0455   HCO3 22.4 11/06/2021 0455   TCO2 24 11/06/2021 0455   ACIDBASEDEF 3.0 (H) 11/06/2021 0455   O2SAT 72.2 11/06/2021 1007   [place]ox 46.7% > 72% Na+ 130 BUN 20 Cr 1.86 WBC 34.8 H/H 8.0/25.2  CXR personally reviewed> bilateral airspace disease. ETT 2 cm above carina. Cardiomegaly.  Resolved Hospital Problem list     Assessment & Plan:   Severe tricuspid regurgitation and right heart failure, s/p TVR and placement of  annuloplasty ring Pericardial effusion, likely from poor lymphatic drainage of pericardial space due to right heart failure Chronic HFrEF with AICD -Swan-Ganz catheter per cardiology -Weaning inotropes and vasopressors per cardiology  Acute respiratory failure with  hypoxia ARDS -LTVV, vent changed to 8cc/kg IBW, PEEP increased. Meeting Pplat and DP goals currently. -VAP prevention protocol -PAD protocol for sedation -goal net negative volume status -trach aspirate, checking PCT & LA -empiric broad spectrum antibiotics for aspiration, HAP coverage -repeat ABG this afternoon  AKI on CKD IIIB -renally dose meds, avoid nephrotoxic meds -strict I/Os -maintain adequate perfusion -monitor  A-fib, unable to complete pericardial ablation, status post Watchman - Continue amiodarone daily.  Not able to tolerate other rate control meds due to heart failure.  Acute on chronic anemia, lower GI bleed related to ischemic colon. S/p 2 units pRBC, 2 FFP, 1 platelets intra-op. -transfuse for Hb <7 or hemodynamically significant bleeding -monitor  Chronic OSA, intolerant to CPAP -may need to try CPAP nocturnally after extubation   Husband updated at bedside today.  Best Practice (right click and "Reselect all SmartList Selections" daily)   Diet/type: tubefeeds DVT prophylaxis: SCD GI prophylaxis: PPI Lines: Central line and Arterial Line Foley:  Yes, and it is still needed Code Status:  full code Last date of multidisciplinary goals of care discussion [ husband updated 3/2]  Labs   CBC: Recent Labs  Lab 10/25/2021 1625 10/15/2021 1733 10/28/2021 2145 11/05/21 0340 11/05/21 0345 11/05/21 1100 11/05/21 1600 11/06/21 0449 11/06/21 0455 11/06/21 0502  WBC 28.9*  --  28.4* 24.2*  --   --  36.3*  --   --  34.8*  HGB 11.9*   10.9*   < > 9.7* 8.6*   < > 7.1* 8.2* 8.8* 8.5* 8.0*  HCT 35.0*   34.6*   < > 30.5* 27.3*   < > 21.0* 26.9* 26.0* 25.0* 25.2*  MCV 92.5  --  91.0 89.5  --   --  92.4  --   --  92.0  PLT 294  --  293 255  --   --  283  --   --  252   < > = values in this interval not displayed.    Basic Metabolic Panel: Recent Labs  Lab 11/03/21 0044 10/17/2021 0500 10/11/2021 0842 11/01/2021 1422 10/30/2021 1428 11/03/2021 2145 11/05/21 0340  11/05/21 0345 11/05/21 1100 11/05/21 1600 11/05/21 2103 11/06/21 0449 11/06/21 0455 11/06/21 0502  NA 139 138   < > 137   < > 135 136   < > 138  --  127* 131* 129* 130*  K 3.2* 2.9*   < > 3.6   < > 3.1* 3.9   < > 3.7  --  3.7 4.1 3.9 3.9  CL 99 105   < > 102  --  103 105  --   --   --  95*  --   --  99  CO2 31 27  --   --   --  21* 21*  --   --   --  22  --   --  23  GLUCOSE 92 91   < > 205*  --  239* 125*  --   --   --  272*  --   --  113*  BUN 19 15   < > 18  --  16 16  --   --   --  18  --   --  20  CREATININE 1.66* 1.41*   < >  1.20*  --  1.67* 1.59*  --   --   --  1.94*  --   --  1.86*  CALCIUM 8.7* 7.8*  --   --   --  8.5* 8.7*  --   --   --  7.8*  --   --  8.3*  MG 1.8 2.1  --   --   --  3.2* 2.9*  --   --  2.8*  --   --   --   --    < > = values in this interval not displayed.   GFR: Estimated Creatinine Clearance: 32.4 mL/min (A) (by C-G formula based on SCr of 1.86 mg/dL (H)). Recent Labs  Lab 10/26/2021 2145 11/05/21 0340 11/05/21 1600 11/06/21 0502  WBC 28.4* 24.2* 36.3* 34.8*    Liver Function Tests: Recent Labs  Lab 11/01/21 0506 11/02/21 0144 11/03/21 0044 10/13/2021 0500 11/06/21 0502  AST 32 31 31 26  82*  ALT 23 23 23 19 11   ALKPHOS 105 106 107 90 88  BILITOT 0.5 0.4 0.2* 0.4 0.8  PROT 5.4* 5.6* 5.7* 5.2* 4.5*  ALBUMIN 2.3* 2.4* 2.5* 2.2* 2.3*   No results for input(s): LIPASE, AMYLASE in the last 168 hours. No results for input(s): AMMONIA in the last 168 hours.  ABG    Component Value Date/Time   PHART 7.359 11/06/2021 0455   PCO2ART 39.7 11/06/2021 0455   PO2ART 35 (LL) 11/06/2021 0455   HCO3 22.4 11/06/2021 0455   TCO2 24 11/06/2021 0455   ACIDBASEDEF 3.0 (H) 11/06/2021 0455   O2SAT 72.2 11/06/2021 1007     Coagulation Profile: Recent Labs  Lab 11/03/21 0044 10/08/2021 1625 11/05/21 0356  INR 1.2 1.5* 1.4*    Cardiac Enzymes: No results for input(s): CKTOTAL, CKMB, CKMBINDEX, TROPONINI in the last 168 hours.  HbA1C: Hgb A1c MFr  Bld  Date/Time Value Ref Range Status  05/07/2021 10:38 AM 5.8 (H) 4.8 - 5.6 % Final    Comment:    (NOTE) Pre diabetes:          5.7%-6.4%  Diabetes:              >6.4%  Glycemic control for   <7.0% adults with diabetes     CBG: Recent Labs  Lab 11/05/21 1545 11/05/21 1951 11/05/21 2352 11/06/21 0448 11/06/21 0753  GLUCAP 184* 132* 111* 111* 111*    Review of Systems:   Unable to be obtained due to being intubated& sedated.  Past Medical History:  She,  has a past medical history of AICD (automatic cardioverter/defibrillator) present, Biventricular implantable cardiac defibrillator), Breast cancer ([place]) (1998), CHF (congestive heart failure) ([place]), Chronic kidney disease, Complication of anesthesia, GERD (gastroesophageal reflux disease), Gout, History of bronchitis (2 yrs ago), History of shingles, Hyperlipidemia, Insomnia, Nonischemic cardiomyopathy ([place]), NSVT (nonsustained ventricular tachycardia), Obesity, OSA (obstructive sleep apnea), Pneumonia (2005), Presence of permanent cardiac pacemaker, Shortness of breath dyspnea, and sprint Fidelis.   Surgical History:   Past Surgical History:  Procedure Laterality Date   ABDOMINAL HYSTERECTOMY     APPENDECTOMY  09/07/1976   ATRIAL FIBRILLATION ABLATION N/A 06/30/2021   Procedure: ATRIAL FIBRILLATION ABLATION;  Surgeon: Vickie Epley, MD;  Location: [place] [facility];  Service: Cardiovascular;  Laterality: N/A;   BIOPSY  10/24/2021   Procedure: BIOPSY;  Surgeon: Jackquline Denmark, MD;  Location: [place];  Service: Gastroenterology;;   BiV ICD  09/07/2005   [employer]   BIV ICD GENERTAOR CHANGE OUT N/A 12/16/2011  Procedure: BIV ICD GENERTAOR CHANGE OUT;  Surgeon: [name], MD;  Location: [facility] CATH LAB;  Service: Cardiovascular;  Laterality: N/A;   CHOLECYSTECTOMY  09/07/2017   COLONOSCOPY     COLONOSCOPY WITH PROPOFOL N/A 10/16/2021   Procedure: COLONOSCOPY WITH PROPOFOL;  Surgeon: Jackquline Denmark, MD;  Location:  [place];  Service: Gastroenterology;  Laterality: N/A;   cyst removed from wrist Left    early 70's   ESOPHAGOGASTRODUODENOSCOPY (EGD) WITH PROPOFOL N/A 10/16/2021   Procedure: ESOPHAGOGASTRODUODENOSCOPY (EGD) WITH PROPOFOL;  Surgeon: Jackquline Denmark, MD;  Location: [facility] ENDOSCOPY;  Service: Gastroenterology;  Laterality: N/A;   ICD LEAD REMOVAL Right 10/18/2014   Procedure: ICD LEAD REMOVAL/EXTRACTION/REIMPLANTATION;  Surgeon: Evans Lance, MD;  Location: [place];  Service: Cardiovascular;  Laterality: Right;   KYPHOPLASTY N/A 06/06/2021   Procedure: KYPHOPLASTY LUMBAR ONE;  Surgeon: Karsten Ro, DO;  Location: [place];  Service: Neurosurgery;  Laterality: N/A;   LEFT ATRIAL APPENDAGE OCCLUSION N/A 08/21/2021   Procedure: LEFT ATRIAL APPENDAGE OCCLUSION;  Surgeon: Vickie Epley, MD;  Location: [place] [facility];  Service: Cardiovascular;  Laterality: N/A;   MASTECTOMY Left 09/07/1996   POLYPECTOMY  10/23/2021   Procedure: POLYPECTOMY;  Surgeon: Jackquline Denmark, MD;  Location: [facility] ENDOSCOPY;  Service: Gastroenterology;;   port a cath placed  09/07/1996   port removed  09/07/1996   RIGHT HEART CATH N/A 06/20/2018   Procedure: RIGHT HEART CATH;  Surgeon: Jolaine Artist, MD;  Location: [place] [facility];  Service: Cardiovascular;  Laterality: N/A;   RIGHT/LEFT HEART CATH AND CORONARY ANGIOGRAPHY N/A 10/25/2021   Procedure: RIGHT/LEFT HEART CATH AND CORONARY ANGIOGRAPHY;  Surgeon: Jolaine Artist, MD;  Location: [place] [facility];  Service: Cardiovascular;  Laterality: N/A;   TEE WITHOUT CARDIOVERSION N/A 08/21/2021   Procedure: TRANSESOPHAGEAL ECHOCARDIOGRAM (TEE);  Surgeon: Vickie Epley, MD;  Location: [place] [facility];  Service: Cardiovascular;  Laterality: N/A;   TEE WITHOUT CARDIOVERSION N/A 10/07/2021   Procedure: TRANSESOPHAGEAL ECHOCARDIOGRAM (TEE);  Surgeon: Sanda Klein, MD;  Location: [place];  Service: Cardiovascular;  Laterality: N/A;   TEE WITHOUT  CARDIOVERSION N/A 10/28/2021   Procedure: TRANSESOPHAGEAL ECHOCARDIOGRAM (TEE);  Surgeon: Dahlia Byes, MD;  Location: [place];  Service: Open Heart Surgery;  Laterality: N/A;   TRICUSPID VALVE REPLACEMENT N/A 10/25/2021   Procedure: TRICUSPID VALVE REPAIR USING EDWARDS MC3 TRICUSPID ANNULOPLASTY RING SIZE 28MM;  Surgeon: Dahlia Byes, MD;  Location: [place];  Service: Open Heart Surgery;  Laterality: N/A;     Social History:   reports that she has never smoked. She has never used smokeless tobacco. She reports that she does not drink alcohol and does not use drugs.   Family History:  Her family history includes Alzheimer's disease in her father; Diabetes in her mother; Heart disease in her father.   Allergies Allergies  Allergen Reactions   Buprenorphine Hcl Shortness Of Breath and Other (See Comments)    Patient required emergent use of naloxone and naloxone drip in ICU for only taking Percocet 5-325 over the course of one day. She appears to have impaired metabolism of opioid medications and providers should use these medications with extreme caution.   Morphine And Related Shortness Of Breath, Rash and Other (See Comments)    Patient required emergent use of naloxone and naloxone drip in ICU for only taking Percocet 5-325 over the course of one day. She appears to have impaired metabolism of opioid medications and providers should use these medications with  extreme caution.  Mental status changes, also    Oxycodone Shortness Of Breath and Other (See Comments)    Mental status changes and Patient required emergent use of naloxone and naloxone drip in ICU for only taking Percocet 5-325 over the course of one day. She appears to have impaired metabolism of opioid medications and providers should use these medications with extreme caution.    Percocet [Oxycodone-Acetaminophen] Shortness Of Breath and Other (See Comments)    Hospitalized-  Patient required emergent use of naloxone and  naloxone drip in ICU for only taking Percocet 5-325 over the course of one day. She appears to have impaired metabolism of opioid medications and providers should use these medications with extreme caution.      Home Medications  Prior to Admission medications   Medication Sig Start Date End Date Taking? Authorizing Provider  alendronate (FOSAMAX) 70 MG tablet Take 70 mg by mouth every Monday.   Yes [provider]  allopurinol (ZYLOPRIM) 100 MG tablet Take 100 mg by mouth in the morning. 03/18/18  Yes [provider]  amiodarone (PACERONE) 200 MG tablet Take 200 mg by mouth daily.   Yes [provider]  atorvastatin (LIPITOR) 10 MG tablet Take 10 mg by mouth in the morning.   Yes [provider]  bisoprolol (ZEBETA) 10 MG tablet Take 10 mg by mouth in the morning.   Yes [provider]  buPROPion (WELLBUTRIN SR) 150 MG 12 hr tablet Take 150 mg by mouth 2 (two) times daily. 04/18/21  Yes [provider]  Cholecalciferol (VITAMIN D3) 5000 units TABS Take 5,000 Units by mouth in the morning.   Yes [provider]  clopidogrel (PLAVIX) 75 MG tablet Take 1 tablet (75 mg total) by mouth daily. 10/07/21 04/05/22 Yes Kathyrn Drown D, NP  Cyanocobalamin (VITAMIN B-12) 5000 MCG SUBL Place 5,000 mcg under the tongue in the morning.   Yes [provider]  JARDIANCE 10 MG TABS tablet TAKE 1 TABLET BY MOUTH DAILY BEFORE BREAKFAST. Patient taking differently: Take 10 mg by mouth daily before breakfast. 10/21/21  Yes Larey Dresser, MD  metolazone (ZAROXOLYN) 2.5 MG tablet Take 2.5 mg by mouth See admin instructions. Take 2.5 mg by mouth in the morning on Tuesdays and Thursdays only   Yes [provider]  Multiple Vitamins-Minerals (ONE-A-DAY WOMENS 50+) TABS Take 1 tablet by mouth daily with breakfast.   Yes [provider]  pantoprazole (PROTONIX) 40 MG tablet Take 1 tablet (40 mg total) by mouth daily. Patient taking  differently: Take 40 mg by mouth daily before breakfast. 08/22/21  Yes Kathyrn Drown D, NP  potassium chloride SA (KLOR-CON M) 20 MEQ tablet Take 40 mEq by mouth See admin instructions. Take 40 mEq by mouth in the morning, 40 mEq at lunchtime, and 40 mEq between 3 PM-4 PM daily   Yes [provider]  spironolactone (ALDACTONE) 25 MG tablet Take 25 mg by mouth at bedtime.   Yes [provider]  torsemide (DEMADEX) 20 MG tablet Take 2 tablets (40 mg total) by mouth 2 (two) times daily. Patient taking differently: Take 40 mg by mouth See admin instructions. Take 40 mg by mouth in the morning, 40 mg at lunchtime, and 40 mg between 3 PM-4 PM daily 10/16/21  Yes Bensimhon, Shaune Pascal, MD  venlafaxine XR (EFFEXOR-XR) 150 MG 24 hr capsule Take 150 mg by mouth daily with breakfast.   Yes [provider]  zolpidem (AMBIEN) 10 MG tablet Take  10 mg by mouth at bedtime.   Yes [provider]  bisoprolol (ZEBETA) 5 MG tablet Take 1 tablet (5 mg total) by mouth daily. Patient not taking: Reported on 10/28/2021 07/16/21   Rafael Bihari, FNP  Multiple Vitamin (MULTIVITAMIN WITH MINERALS) TABS tablet Take 1 tablet by mouth in the morning. One-A-Day for Women 50+ Patient not taking: Reported on 10/28/2021    [provider]     Critical care time: 50 min.       Julian Hy, DO 11/06/21 2:38 PM [facility]

## 2021-11-06 NOTE — Progress Notes (Incomplete)
11/06/2021   I have seen and evaluated the patient for respiratory failure.  S:  ***  O: Blood pressure (!) 91/34, pulse 88, temperature 97.9 F (36.6 C), resp. rate (!) 23, height 5\' 2"  (1.575 m), weight 99.5 kg, SpO2 96 %.  Constitutional: ***  Eyes: *** Ears, nose, mouth, and throat: *** Cardiovascular: *** Respiratory: *** Gastrointestinal: *** Skin: No rashes, normal turgor Neurologic: *** Psychiatric: ***  Study reviews:  A:  Acute hypoxemic respiratory failure some combination aspiration, CAP or pulmonary edema LBBB +/- adriamycin-induced NICM Progressive right heart failure complicated by severe tricuspid regurgitation s/p  TVR 2/28.  Procedure complicated by VT arrest coming off pump, brief. Fluid overloaded state of heart. Postoperative mixed cardiogenic and septic shock. Pericardial effusion moderate without tamponade. Chronic AF, hx NSVT, AICD in place Question UGIB- 2/24 EGD benign HLD, GERD, OSA ontolerant to BIPAP Hx breast ca 1998 with exposure to adriamycin DM with hyperglycemia  P:  - Vent support/VAP prevention bundle, LTVV, limit DP as allowed by lung compliance and pH - Prop, PRN versed/fent for sedation *** reglan ***QTc - Diuretic and inotropes wean per TCTS - *** abx - vanc, zosyn duration TBD - levemir/SSI - full dose aspirin ***  Patient critically ill due to *** Interventions to address this today *** Risk of deterioration without these interventions is high  I personally spent *** minutes providing critical care not including any separately billable procedures  Erskine Emery MD [facility] Prefer epic messenger for cross cover needs If after hours, please call E-link

## 2021-11-06 NOTE — Progress Notes (Signed)
EVENING ROUNDS NOTE : ? ?   ?[address] ?      [place] [address] ?            [phone]   ?              ?2 Days Post-Op ?Procedure(s) (LRB): ?TRICUSPID VALVE REPAIR USING EDWARDS MC3 TRICUSPID ANNULOPLASTY RING SIZE 28MM (N/A) ?TRANSESOPHAGEAL ECHOCARDIOGRAM (TEE) (N/A) ? ? ?Total Length of Stay:  LOS: 10 days  ?Events:   ?Remains on chemical support ?Labile BP ?Received unit of blood ?Not much change ? ? ?BP (!) 98/38   Pulse 88   Temp 98.1 ?F (36.7 ?C)   Resp 19   Ht 5\' 2"  (1.575 m)   Wt 99.5 kg Comment: Double checked  SpO2 95%   BMI 40.12 kg/m?  ? ?PAP: (36-49)/(10-26) 41/20 ?CVP:  [6 mmHg-15 mmHg] 13 mmHg ?CO:  [4.9 L/min-5.1 L/min] 5.1 L/min ?CI:  [2.5 L/min/m2-2.6 L/min/m2] 2.6 L/min/m2 ? ?Vent Mode: PRVC ?FiO2 (%):  [100 %] 100 % ?Set Rate:  [18 bmp-22 bmp] 22 bmp ?Vt Set:  [380 mL-560 mL] 380 mL ?PEEP:  [5 cmH20-16 cmH20] 16 cmH20 ?Plateau Pressure:  [20 cmH20-30 cmH20] 30 cmH20 ? ? sodium chloride 20 mL/hr at 11/06/21 1600  ? sodium chloride    ? sodium chloride 10 mL/hr at 11/06/21 0800  ? sodium chloride    ? amiodarone 30 mg/hr (11/06/21 1600)  ? epinephrine 10 mcg/min (11/06/21 1600)  ? fentaNYL infusion INTRAVENOUS 50 mcg/hr (11/06/21 1600)  ? lactated ringers    ? norepinephrine (LEVOPHED) Adult infusion 52 mcg/min (11/06/21 1600)  ? piperacillin-tazobactam (ZOSYN)  IV 12.5 mL/hr at 11/06/21 1600  ? propofol (DIPRIVAN) infusion 10 mcg/kg/min (11/06/21 0950)  ? [START ON 11/08/2021] vancomycin    ? vancomycin    ? vasopressin 0.04 Units/min (11/06/21 1600)  ? ? ?I/O last 3 completed shifts: ?In: 5705.9 [P.O.:240; I.V.:4169.9; Other:90; NG/GT:60; IV Piggyback:1146] ?Out: 1502 [OFHQR:9758; Emesis/NG output:50; Chest Tube:385] ? ? ?CBC Latest Ref Rng & Units 11/06/2021 11/06/2021 11/06/2021  ?WBC 4.0 - 10.5 K/uL - 34.8(H) -  ?Hemoglobin 12.0 - 15.0 g/dL 9.2(L) 8.0(L) 8.5(L)  ?Hematocrit 36.0 - 46.0 % 27.0(L) 25.2(L) 25.0(L)  ?Platelets 150 - 400 K/uL - 252 -  ? ? ?BMP Latest Ref  Rng & Units 11/06/2021 11/06/2021 11/06/2021  ?Glucose 70 - 99 mg/dL 100(H) - 113(H)  ?BUN 8 - 23 mg/dL 23 - 20  ?Creatinine 0.44 - 1.00 mg/dL 1.95(H) - 1.86(H)  ?BUN/Creat Ratio 12 - 28 - - -  ?Sodium 135 - 145 mmol/L 131(L) 131(L) 130(L)  ?Potassium 3.5 - 5.1 mmol/L 4.0 4.0 3.9  ?Chloride 98 - 111 mmol/L 100 - 99  ?CO2 22 - 32 mmol/L 21(L) - 23  ?Calcium 8.9 - 10.3 mg/dL 8.2(L) - 8.3(L)  ? ? ?ABG ?   ?Component Value Date/Time  ? PHART 7.297 (L) 11/06/2021 1051  ? PCO2ART 47.0 11/06/2021 1051  ? PO2ART 115 (H) 11/06/2021 1051  ? HCO3 23.0 11/06/2021 1051  ? TCO2 24 11/06/2021 1051  ? ACIDBASEDEF 3.0 (H) 11/06/2021 1051  ? O2SAT 98 11/06/2021 1051  ? ? ? ? ? ?Melodie Bouillon, MD ?11/06/2021 4:15 PM ? ? ?

## 2021-11-06 NOTE — CV Procedure (Signed)
Catch up note from earlier today.  ? ?Radial arterial line placement.  ?  ?Emergent consent assumed. The left wrist was prepped and draped in the routine sterile fashion a single lumen radial arterial catheter was placed in the left radial artery using a modified Seldinger technique and u/s guidance. Good blood flow and wave forms. A dressing was placed.  ?  ? ?Glori Bickers, MD  ?4:00 PM ? ?

## 2021-11-07 ENCOUNTER — Inpatient Hospital Stay (HOSPITAL_COMMUNITY): Payer: Medicare PPO

## 2021-11-07 DIAGNOSIS — J690 Pneumonitis due to inhalation of food and vomit: Secondary | ICD-10-CM

## 2021-11-07 DIAGNOSIS — R570 Cardiogenic shock: Secondary | ICD-10-CM

## 2021-11-07 DIAGNOSIS — J80 Acute respiratory distress syndrome: Secondary | ICD-10-CM

## 2021-11-07 DIAGNOSIS — N179 Acute kidney failure, unspecified: Secondary | ICD-10-CM

## 2021-11-07 DIAGNOSIS — A419 Sepsis, unspecified organism: Secondary | ICD-10-CM

## 2021-11-07 DIAGNOSIS — I3139 Other pericardial effusion (noninflammatory): Secondary | ICD-10-CM | POA: Diagnosis not present

## 2021-11-07 DIAGNOSIS — J810 Acute pulmonary edema: Secondary | ICD-10-CM

## 2021-11-07 LAB — BPAM RBC
Blood Product Expiration Date: 202303052359
Blood Product Expiration Date: 202303142359
Blood Product Expiration Date: 202303142359
Blood Product Expiration Date: 202303192359
Blood Product Expiration Date: 202303212359
Blood Product Expiration Date: 202303222359
ISSUE DATE / TIME: 202302280846
ISSUE DATE / TIME: 202302280846
ISSUE DATE / TIME: 202302281101
ISSUE DATE / TIME: 202303021021
Unit Type and Rh: 600
Unit Type and Rh: 600
Unit Type and Rh: 600
Unit Type and Rh: 600
Unit Type and Rh: 600
Unit Type and Rh: 600

## 2021-11-07 LAB — POCT I-STAT 7, (LYTES, BLD GAS, ICA,H+H)
Acid-base deficit: 4 mmol/L — ABNORMAL HIGH (ref 0.0–2.0)
Acid-base deficit: 7 mmol/L — ABNORMAL HIGH (ref 0.0–2.0)
Bicarbonate: 22.8 mmol/L (ref 20.0–28.0)
Bicarbonate: 19.8 mmol/L — ABNORMAL LOW (ref 20.0–28.0)
Calcium, Ion: 1.12 mmol/L — ABNORMAL LOW (ref 1.15–1.40)
Calcium, Ion: 1.14 mmol/L — ABNORMAL LOW (ref 1.15–1.40)
HCT: 32 % — ABNORMAL LOW (ref 36.0–46.0)
HCT: 30 % — ABNORMAL LOW (ref 36.0–46.0)
Hemoglobin: 10.9 g/dL — ABNORMAL LOW (ref 12.0–15.0)
Hemoglobin: 10.2 g/dL — ABNORMAL LOW (ref 12.0–15.0)
O2 Saturation: 93 %
O2 Saturation: 81 %
Patient temperature: 37.5
Patient temperature: 38.1
Potassium: 4.3 mmol/L (ref 3.5–5.1)
Potassium: 3.9 mmol/L (ref 3.5–5.1)
Sodium: 134 mmol/L — ABNORMAL LOW (ref 135–145)
Sodium: 134 mmol/L — ABNORMAL LOW (ref 135–145)
TCO2: 24 mmol/L (ref 22–32)
TCO2: 21 mmol/L — ABNORMAL LOW (ref 22–32)
pCO2 arterial: 49.9 mmHg — ABNORMAL HIGH (ref 32–48)
pCO2 arterial: 45.5 mmHg (ref 32–48)
pH, Arterial: 7.271 — ABNORMAL LOW (ref 7.35–7.45)
pH, Arterial: 7.251 — ABNORMAL LOW (ref 7.35–7.45)
pO2, Arterial: 79 mmHg — ABNORMAL LOW (ref 83–108)
pO2, Arterial: 56 mmHg — ABNORMAL LOW (ref 83–108)

## 2021-11-07 LAB — TYPE AND SCREEN
ABO/RH(D): A NEG
Antibody Screen: NEGATIVE
Unit division: 0
Unit division: 0
Unit division: 0
Unit division: 0
Unit division: 0
Unit division: 0

## 2021-11-07 LAB — URINE CULTURE: Culture: NO GROWTH

## 2021-11-07 LAB — GLUCOSE, CAPILLARY
Glucose-Capillary: 112 mg/dL — ABNORMAL HIGH (ref 70–99)
Glucose-Capillary: 144 mg/dL — ABNORMAL HIGH (ref 70–99)
Glucose-Capillary: 77 mg/dL (ref 70–99)
Glucose-Capillary: 85 mg/dL (ref 70–99)
Glucose-Capillary: 91 mg/dL (ref 70–99)
Glucose-Capillary: 96 mg/dL (ref 70–99)
Glucose-Capillary: 99 mg/dL (ref 70–99)

## 2021-11-07 LAB — COOXEMETRY PANEL
Carboxyhemoglobin: 0.6 % (ref 0.5–1.5)
Carboxyhemoglobin: 0.7 % (ref 0.5–1.5)
Methemoglobin: 3 % — ABNORMAL HIGH (ref 0.0–1.5)
Methemoglobin: <0.7 % (ref 0.0–1.5)
O2 Saturation: 78.3 %
O2 Saturation: 59 %
Total hemoglobin: 9.4 g/dL — ABNORMAL LOW (ref 12.0–16.0)
Total hemoglobin: 8.9 g/dL — ABNORMAL LOW (ref 12.0–16.0)

## 2021-11-07 LAB — HEMOGLOBIN A1C
Hgb A1c MFr Bld: 5.5 % (ref 4.8–5.6)
Mean Plasma Glucose: 111.15 mg/dL

## 2021-11-07 LAB — MAGNESIUM: Magnesium: 2.4 mg/dL (ref 1.7–2.4)

## 2021-11-07 LAB — COMPREHENSIVE METABOLIC PANEL
ALT: 9 U/L (ref 0–44)
AST: 98 U/L — ABNORMAL HIGH (ref 15–41)
Albumin: 2.1 g/dL — ABNORMAL LOW (ref 3.5–5.0)
Alkaline Phosphatase: 143 U/L — ABNORMAL HIGH (ref 38–126)
Anion gap: 12 (ref 5–15)
BUN: 25 mg/dL — ABNORMAL HIGH (ref 8–23)
CO2: 20 mmol/L — ABNORMAL LOW (ref 22–32)
Calcium: 7.9 mg/dL — ABNORMAL LOW (ref 8.9–10.3)
Chloride: 97 mmol/L — ABNORMAL LOW (ref 98–111)
Creatinine, Ser: 1.93 mg/dL — ABNORMAL HIGH (ref 0.44–1.00)
GFR, Estimated: 28 mL/min — ABNORMAL LOW (ref >60)
Glucose, Bld: 89 mg/dL (ref 70–99)
Potassium: 3.9 mmol/L (ref 3.5–5.1)
Sodium: 129 mmol/L — ABNORMAL LOW (ref 135–145)
Total Bilirubin: 0.8 mg/dL (ref 0.3–1.2)
Total Protein: 4.9 g/dL — ABNORMAL LOW (ref 6.5–8.1)

## 2021-11-07 LAB — PROCALCITONIN: Procalcitonin: 3.66 ng/mL

## 2021-11-07 LAB — CBC
HCT: 30.6 % — ABNORMAL LOW (ref 36.0–46.0)
Hemoglobin: 9.7 g/dL — ABNORMAL LOW (ref 12.0–15.0)
MCH: 28.6 pg (ref 26.0–34.0)
MCHC: 31.7 g/dL (ref 30.0–36.0)
MCV: 90.3 fL (ref 80.0–100.0)
Platelets: 238 10*3/uL (ref 150–400)
RBC: 3.39 MIL/uL — ABNORMAL LOW (ref 3.87–5.11)
RDW: 19.6 % — ABNORMAL HIGH (ref 11.5–15.5)
WBC: 31.6 10*3/uL — ABNORMAL HIGH (ref 4.0–10.5)
nRBC: 0.8 % — ABNORMAL HIGH (ref 0.0–0.2)

## 2021-11-07 LAB — TRIGLYCERIDES: Triglycerides: 130 mg/dL (ref <150)

## 2021-11-07 LAB — PHOSPHORUS: Phosphorus: 5 mg/dL — ABNORMAL HIGH (ref 2.5–4.6)

## 2021-11-07 MED ORDER — ARTIFICIAL TEARS OPHTHALMIC OINT
1.0000 "application " | TOPICAL_OINTMENT | Freq: Three times a day (TID) | OPHTHALMIC | Status: DC
  Administered 2021-11-07 – 2021-11-17 (×28): 1 via OPHTHALMIC
  Filled 2021-11-07 (×2): qty 3.5

## 2021-11-07 MED ORDER — STERILE WATER FOR INJECTION IJ SOLN
10.0000 ng/kg/min | INTRAVENOUS | Status: DC
  Administered 2021-11-07 – 2021-11-15 (×20): 50 ng/kg/min via RESPIRATORY_TRACT
  Administered 2021-11-15: 20 ng/kg/min via RESPIRATORY_TRACT
  Filled 2021-11-07 (×30): qty 5

## 2021-11-07 MED ORDER — METHYLNALTREXONE BROMIDE 12 MG/0.6ML ~~LOC~~ SOLN
12.0000 mg | Freq: Once | SUBCUTANEOUS | Status: AC
  Administered 2021-11-07: 12 mg via SUBCUTANEOUS
  Filled 2021-11-07: qty 0.6

## 2021-11-07 MED ORDER — MIDODRINE HCL 5 MG PO TABS: 10.0000 mg | ORAL_TABLET | Freq: Three times a day (TID) | ORAL | Status: DC

## 2021-11-07 MED ORDER — VITAL AF 1.2 CAL PO LIQD
1000.0000 mL | ORAL | Status: DC
  Administered 2021-11-07 (×2): 1000 mL

## 2021-11-07 MED ORDER — ENOXAPARIN SODIUM 40 MG/0.4ML IJ SOSY
40.0000 mg | PREFILLED_SYRINGE | INTRAMUSCULAR | Status: DC
Start: 2021-11-07 — End: 2021-11-08
  Administered 2021-11-07: 40 mg via SUBCUTANEOUS
  Filled 2021-11-07: qty 0.4

## 2021-11-07 MED ORDER — SENNOSIDES 8.8 MG/5ML PO SYRP
10.0000 mL | ORAL_SOLUTION | Freq: Two times a day (BID) | ORAL | Status: DC
  Administered 2021-11-07 – 2021-11-17 (×18): 10 mL
  Filled 2021-11-07 (×18): qty 10

## 2021-11-07 MED ORDER — POLYETHYLENE GLYCOL 3350 17 G PO PACK: 17.0000 g | PACK | Freq: Two times a day (BID) | ORAL | Status: DC

## 2021-11-07 MED ORDER — VANCOMYCIN HCL 1250 MG/250ML IV SOLN
1250.0000 mg | INTRAVENOUS | Status: DC
  Filled 2021-11-07: qty 250

## 2021-11-07 MED ORDER — POLYETHYLENE GLYCOL 3350 17 G PO PACK
17.0000 g | PACK | Freq: Every day | ORAL | Status: DC
Start: 2021-11-07 — End: 2021-11-18
  Administered 2021-11-07 – 2021-11-17 (×10): 17 g
  Filled 2021-11-07 (×10): qty 1

## 2021-11-07 MED ORDER — FUROSEMIDE 10 MG/ML IJ SOLN
160.0000 mg | Freq: Once | INTRAVENOUS | Status: AC
  Administered 2021-11-07: 160 mg via INTRAVENOUS
  Filled 2021-11-07: qty 10

## 2021-11-07 MED ORDER — ROCURONIUM BROMIDE 10 MG/ML (PF) SYRINGE
50.0000 mg | PREFILLED_SYRINGE | INTRAVENOUS | Status: DC | PRN
  Administered 2021-11-07 – 2021-11-16 (×20): 50 mg via INTRAVENOUS
  Filled 2021-11-07 (×16): qty 10

## 2021-11-07 MED FILL — Heparin Sodium (Porcine) Inj 1000 Unit/ML: INTRAMUSCULAR | Qty: 30 | Status: AC

## 2021-11-07 MED FILL — Sodium Bicarbonate IV Soln 8.4%: INTRAVENOUS | Qty: 50 | Status: AC

## 2021-11-07 MED FILL — Heparin Sodium (Porcine) Inj 1000 Unit/ML: INTRAMUSCULAR | Qty: 20 | Status: AC

## 2021-11-07 MED FILL — Sodium Chloride IV Soln 0.9%: INTRAVENOUS | Qty: 2000 | Status: AC

## 2021-11-07 MED FILL — Calcium Chloride Inj 10%: INTRAVENOUS | Qty: 10 | Status: AC

## 2021-11-07 MED FILL — Electrolyte-R (PH 7.4) Solution: INTRAVENOUS | Qty: 3000 | Status: AC

## 2021-11-07 MED FILL — Mannitol IV Soln 20%: INTRAVENOUS | Qty: 500 | Status: AC

## 2021-11-07 NOTE — Progress Notes (Signed)
? ?NAME:  [name], MRN:  [id number], DOB:  March 29, 1956, LOS: 16 ?ADMISSION DATE:  10/23/2021, CONSULTATION DATE:  11/06/21 ?REFERRING MD:  Nils Pyle, CHIEF COMPLAINT:  hypoxia  ? ?History of Present Illness:  ?[name] is a 66 year old woman with a history of HFrEF and paroxysmal atrial fibrillation who was transferred to [facility] with GI bleed and pericardial effusion.  She had recently undergone watchman insertion in January 2023.  She underwent EGD which did not explain bleeding.  Colonoscopy demonstrated sessile polyps, which were removed in the ascending colon and areas of ischemic colonic ulcers.  There was moderate sigmoid diverticulosis.  Heart catheterization demonstrated normal coronary arteries with equalization of left and right-sided heart pressures, severely reduced PAPi due to severe tricuspid regurgitation.  Swan-Ganz catheter was left in place to assist with weaning inotropic support.  Echocardiograms have previously demonstrated reduced RV function and dilation. On 2/28 she underwent Tricuspid repair and ring annuplasty. Post-op day 1 she was extubated but developed rapidly worsening respiratory failure overnight on 3/2 requiring re-intubation by anesthesia. CXR post-intubation had bilateral infiltrates. ? 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 ?Significant Hospital Events: ?Including procedures, antibiotic start and stop dates in addition to other pertinent events   ? ?2/20 admitted for pericardial effusion without tamponade ?2/22 L&R heart cath ?2/24 EGD & colonoscopy ?2/28 tricuspid valve repair; Vt arrest coming off bypass, back on bypass to stabilize ?3/1 extubated post op, reintubated overnight for hypoxia ?3/2 PCCM consulted for vent management.  Started on vancomycin and Zosyn for presumed aspiration pneumonia ?3/3 remains in a mixed picture of cardiogenic  and septic shock.  Meeting endorgan perfusion parameters and goal SCV O2 currently with mix of inotropic and vasoactive support desaturating with head down, Lasix administered. ? ?Interim History / Subjective:  ?Desaturates w/ head flat  ?CI good. CVP 16, SVR tending down.  ?Objective   ?Blood pressure (Abnormal) 98/38, pulse 88, temperature 98.8 ?F (37.1 ?C), resp. rate (Abnormal) 23, height 5\' 2"  (1.575 m), weight 100.2 kg, SpO2 94 %. ?PAP: (37-50)/(15-26) 47/16 ?CVP:  [8 mmHg-47 mmHg] 13 mmHg ?CO:  [4.6 L/min-5.3 L/min] 5.2 L/min ?CI:  [2.4 L/min/m2-2.7 L/min/m2] 2.5 L/min/m2  ?Vent Mode: PRVC ?FiO2 (%):  [100 %] 100 % ?Set Rate:  [20 bmp-22 bmp] 22 bmp ?Vt Set:  [380 mL-400 mL] 380 mL ?PEEP:  [12 cmH20-16 cmH20] 16 cmH20 ?Plateau Pressure:  [24 cmH20-30 cmH20] 28 cmH20  ? ?Intake/Output Summary (Last 24 hours) at 11/07/2021 0914 ?Last data filed at 11/07/2021 0700 ?Gross per 24 hour  ?Intake 4371.7 ml  ?Output 1235 ml  ?Net 3136.7 ml  ? ?Filed Weights  ? 11/05/21 0400 11/06/21 0500 11/07/21 0427  ?Weight: 95.5 kg 99.5 kg 100.2 kg  ? ? ?Examination: ?General: 66 year old female sedated on ventilator ?HEENT normocephalic atraumatic orally intubated ?Pulmonary: Coarse decreased bases bilateral chest tubes in place with serous drainage, remains on high PEEP/FiO2 with plateau pressure currently 28 Last arterial blood gas with mild respiratory acidosisPcxr: Endotracheal tube in satisfactory position as is the Swan-Ganz catheter and chest tubes.  Comparing serial films aeration's slightly improved however still has bilateral airspace disease consistent with probably a mix of edema and pneumonia/ARDS ?Cardiac: Paced rhythm, on multiple pressors ?Abdomen soft not tender ?Extremities warm and dry ?Neuro sedated ?GU clear yellow ?Resolved Hospital Problem list   ? ? ?Assessment & Plan:  ? ?Severe tricuspid regurgitation and  right heart failure, s/p TVR and placement of annuloplasty ring ?Pericardial effusion, likely from poor  lymphatic drainage of pericardial space due to right heart failure ?Cardiogenic shock due to acute on Chronic HFrEF with AICD c/b possible septic component  ?CO and SCVO2 at goal  ?Plan ?Cont to monitor hemodynamics ?IV lasix X 1 ?Cont inotropic and vasopressor support w/ MAP goal > 65 ?PCT rising  ?Day 2 vanc and zosyn (continue) ->presumed PNA  ?On midodrine  ? ?Atrial fib, s/p Watchmans  ?Plan ?Cont amiodarone ?Not able to tolerate BB d/t shock  ?Holding systemic AC for now  ? ?Acute respiratory failure with hypoxia/ARDS secondary to aspiration pneumonia complicated by pulmonary edema ?Plan ?Continue low tidal volume ventilation ?VAP bundle ?PAD protocol RASS goal -2 ?Aiming for negative volume status ?Antibiotics as mentioned above ?A.m. chest x-ray ?Follow-up pending cultures ?Allowing for mild permissive hypercapnia ? ?AKI on CKD IIIB ?-renally dose meds, avoid nephrotoxic meds ?Plan  ?Continue hemodynamic support ?Avoid nephrotoxic agents ?MAP goal greater than 65 ?Strict intake output ?A.m. chemistry ? ?Intermittent fluid and electrolyte balance: Progressive hyponatremia ?Plan ?Getting Lasix ?Serial chemistries ?Replace as needed ? ?Acute on chronic anemia, lower GI bleed related to ischemic colon. S/p 2 units pRBC, 2 FFP, 1 platelets intra-op. ?Plan ?Continue to monitor ?Transfusion trigger less than 7 or if has significant hemodynamic change with active bleeding ? ?-transfuse for Hb <7 or hemodynamically significant bleeding ?-monitor ? ?Chronic OSA, intolerant to CPAP ?Plan ?Assess for CPAP postextubation ? ? ?Best Practice (right click and "Reselect all SmartList Selections" daily)  ? ?Diet/type: tubefeeds ?DVT prophylaxis: SCD ?GI prophylaxis: PPI ?Lines: Central line and Arterial Line ?Foley:  Yes, and it is still needed ?Code Status:  full code ?Last date of multidisciplinary goals of care discussion [ husband updated 3/2] ? ? ?Critical care time: 34 minutes.  ?  ?Erick Colace ACNP-BC ?[place] ?Pager # [phone] OR # [phone] if no answer ? ? ?

## 2021-11-07 NOTE — Progress Notes (Addendum)
Advanced Heart Failure Rounding Note  PCP-Cardiologist: None   Subjective:   02/20: Transferred to [facility] for GI bleed + pericardial effusion 02/22: R/LHC with normal cors, equalized R and L sided pressures with no evidence of LV-RV interaction, RA 17, PA 33/21 (25), Fick CO/CI 4.2/2.2, severely reduced PAPi (in setting of severe TR), PA sat 47%, 48% 2/24: S/P Two polyps in the distal ascending colon, removed with a cold snare. Resected and retrieved. Right colonic ischemic ulcers (healing, no active bleeding, biopsied)-likely etiology of LGI. Moderate sigmoid diverticulosis. 2/25: Diuresed with IV lasix.  2/26: Switched to torsemide 60 /60 .  2/28: S/P TVR . Off pump had VT/VT when PA catheter placed. Had cardiac massage. Cardioverted x2. Unsuccessful. Placed back on pump with restoration of SR. Started on IV amiodarone.  3/1: Extubated 3/2: Reintubated for acute hypoxic respiratory failure. Stat CXR ? Possible b/l PNA. Concern for developing septic shock, WBC 39K. PCT 2.49. Increased pressor requirements w/ NE and VP. Milrinone discontinued w/ low SVR. Several rounds of bicarb for persistent metabolic acidosis. Started on vanc + zosyn. Transfused 1u RBCs  This morning, remains intubated, FiO2 100%.   On Epi 14, NE 48, VP 0.05. Remains off milrinone. MAP 65. CO/CI ok but SVR still low, 760.  Arterial pH off ABG 7.2   Remains on Vanc and Zosyn. mTemp overnight 99.5. WBC downtrending, 39>>31K but PCT trending up, 2.49>>3.66. Blood, respiratory and urine cultures pending.    AKI but SCr stable past 3 days, 1.9. Oliguric despite 80 IV Lasix x 2.  Up 33 lb from pre-op wt. CVP 16  CT output slowing, 70 cc yesterday. Hgb stable 9.7>>10.9  Swan #s CVP 16 PAP 51/20 (28) CO 5.16 CI 2.64 PAPi 1.93 (improved, initially 0.6)  Co-ox 78 SVR 760   Intubated and sedated.   Objective:   Weight Range: 100.2 kg Body mass index is 40.4 kg/m.   Vital Signs:   Temp:  [97.5 F (36.4 C)-99.5  F (37.5 C)] 99.5 F (37.5 C) (03/03 0700) Pulse Rate:  [81-93] 88 (03/03 0700) Resp:  [15-27] 24 (03/03 0630) BP: (70-131)/(34-67) 98/38 (03/02 1230) SpO2:  [84 %-100 %] 96 % (03/03 0700) Arterial Line BP: (26-109)/(21-72) 102/43 (03/03 0700) FiO2 (%):  [100 %] 100 % (03/03 0438) Weight:  [100.2 kg] 100.2 kg (03/03 0427) Last BM Date : 11/02/21  Weight change: Filed Weights   11/05/21 0400 11/06/21 0500 11/07/21 0427  Weight: 95.5 kg 99.5 kg 100.2 kg    Intake/Output:   Intake/Output Summary (Last 24 hours) at 11/07/2021 6962 Last data filed at 11/07/2021 0700 Gross per 24 hour  Intake 4651.61 ml  Output 1235 ml  Net 3416.61 ml      Physical Exam   CVP 16  General:  critically ill, intubated and sedated  HEENT: normal + ETT  Neck: supple. JVD 16+ rt IJ swan cath, Carotids 2+ bilat; no bruits. No lymphadenopathy or thyromegaly appreciated. Cor: PMI nondisplaced. Regular rate & rhythm. No rubs, gallops or murmurs. Sternotomy site ok  Lungs: intubated, decreased BS bilaterally. No wheezing   Abdomen: soft, nontender, nondistended. No hepatosplenomegaly. No bruits or masses. Good bowel sounds. Extremities: no cyanosis, clubbing, rash, 2+ b/l LE  Neuro: intubated and sedated.    Telemetry  AV paced 90s. No further VT /VF   Labs    CBC Recent Labs    11/06/21 1518 11/06/21 1700 11/07/21 0305 11/07/21 0524  WBC 39.2*  --  31.6*  --  HGB 9.9*   < > 9.7* 10.9*  HCT 31.4*   < > 30.6* 32.0*  MCV 90.8  --  90.3  --   PLT 257  --  238  --    < > = values in this interval not displayed.   Basic Metabolic Panel Recent Labs    11/05/21 1600 11/05/21 2103 11/06/21 1518 11/06/21 1700 11/07/21 0305 11/07/21 0524  NA  --    < > 131*   < > 129* 134*  K  --    < > 4.0   < > 3.9 4.3  CL  --    < > 100  --  97*  --   CO2  --    < > 21*  --  20*  --   GLUCOSE  --    < > 100*  --  89  --   BUN  --    < > 23  --  25*  --   CREATININE  --    < > 1.95*  --  1.93*  --    CALCIUM  --    < > 8.2*  --  7.9*  --   MG 2.8*  --   --   --  2.4  --   PHOS  --   --   --   --  5.0*  --    < > = values in this interval not displayed.   Liver Function Tests Recent Labs    11/06/21 1518 11/07/21 0305  AST 89* 98*  ALT 9 9  ALKPHOS 120 143*  BILITOT 1.5* 0.8  PROT 4.6* 4.9*  ALBUMIN 2.3* 2.1*   No results for input(s): LIPASE, AMYLASE in the last 72 hours. Cardiac Enzymes No results for input(s): CKTOTAL, CKMB, CKMBINDEX, TROPONINI in the last 72 hours.  BNP: BNP (last 3 results) Recent Labs    02/24/21 1357 07/16/21 1145 10/19/2021 2138  BNP 326.4* 523.7* 477.9*    ProBNP (last 3 results) No results for input(s): PROBNP in the last 8760 hours.   D-Dimer No results for input(s): DDIMER in the last 72 hours. Hemoglobin A1C Recent Labs    11/07/21 0305  HGBA1C 5.5   Fasting Lipid Panel Recent Labs    11/07/21 0305  TRIG 130    Thyroid Function Tests Recent Labs    11/06/21 0502  TSH 7.134*    Other results:   Imaging    DG Abd 1 View  Result Date: 11/06/2021 CLINICAL DATA:  Enteric tube placement EXAM: ABDOMEN - 1 VIEW COMPARISON:  November 06, 2021 FINDINGS: Incomplete assessment of the pelvis. Air-filled nondilated loops of bowel. Enteric tube tip and side port project over the stomach. The cardial pacing wires. Partial visualization of bilateral chest tubes, PA catheter, AICD and stent graft material. Bibasilar airspace opacities better assessed on same day radiograph. Status post vertebral augmentation of L1. IMPRESSION: Enteric tube tip and side port project over the stomach. Electronically Signed   By: Valentino Saxon M.D.   On: 11/06/2021 12:04     Medications:     Scheduled Medications:  acetaminophen (TYLENOL) oral liquid 160 mg/5 mL  650 mg Per Tube Q6H   aspirin EC  325 mg Oral Daily   Or   aspirin  324 mg Per Tube Daily   atorvastatin  10 mg Per Tube q AM   bisacodyl  10 mg Oral Daily   Or   bisacodyl  10 mg  Rectal Daily  buPROPion  75 mg Per Tube BID   chlorhexidine gluconate (MEDLINE KIT)  15 mL Mouth Rinse BID   Chlorhexidine Gluconate Cloth  6 each Topical Daily   docusate  100 mg Per Tube BID   feeding supplement (PROSource TF)  45 mL Per Tube BID   feeding supplement (VITAL HIGH PROTEIN)  1,000 mL Per Tube Q24H   furosemide  80 mg Intravenous BID   insulin aspart  0-24 Units Subcutaneous Q4H   insulin detemir  10 Units Subcutaneous BID   mouth rinse  15 mL Mouth Rinse 10 times per day   metoCLOPramide (REGLAN) injection  5 mg Intravenous Q6H   pantoprazole sodium  40 mg Per Tube Daily   polyethylene glycol  17 g Per Tube Daily   sodium chloride flush  10-40 mL Intracatheter Q12H   sodium chloride flush  3 mL Intravenous Q12H   venlafaxine  75 mg Per Tube BID WC    Infusions:  sodium chloride 20 mL/hr at 11/07/21 0700   sodium chloride     sodium chloride 10 mL/hr at 11/06/21 0800   sodium chloride     amiodarone 30 mg/hr (11/07/21 0700)   epinephrine 14 mcg/min (11/07/21 0700)   fentaNYL infusion INTRAVENOUS 25 mcg/hr (11/07/21 0700)   lactated ringers     midazolam 1 mg/hr (11/07/21 0700)   norepinephrine (LEVOPHED) Adult infusion 48 mcg/min (11/07/21 0700)   piperacillin-tazobactam (ZOSYN)  IV 12.5 mL/hr at 11/07/21 0700   propofol (DIPRIVAN) infusion Stopped (11/06/21 1800)   [START ON 11/08/2021] vancomycin     vasopressin 0.05 Units/min (11/07/21 0700)     PRN Medications: sodium chloride, Place/Maintain arterial line **AND** sodium chloride, fentaNYL, lip balm, metoprolol tartrate, midazolam, ondansetron (ZOFRAN) IV, sodium chloride flush, sodium chloride flush     Patient Profile    66 y.o. female with h/o PAF with recent failed ablation S/p Watchman implant 08/21/21,  breast CA in 1998 s/p left breast surgery and s/p Adriamycin x 4 cycles, non-ischemic cardiomyopathy/chronic systolic CHF, LBBB, s/p BiV Medtronic ICD, HTN, OSA, and CKD Stage IIIb. Transferred from  OSH for acute GIB and pericardial effusion. Initial Echo here showed EF 35-40% RV severely enlarged w/ low normal systolic fx. Moderate posterior effusion, no tamponade. Severe TR noted. R/LCH showed normal cors, LVEF 40%, Equalized right and left-sided pressure with no evidence of LV-RV interaction and evidence of severe RV failure w/ severely reduced PAPi, 0.6, in setting of severe TR. Went to OR 2/28 for TVR. Off pump had VT/VF when PA catheter placed. Had cardiac massage. Cardioverted x2. Unsuccessful. Placed back on pump with restoration of SR. Started on IV amiodarone.   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 Acute on Chronic Systolic CHF w/ Post Cardiotomy Shock  - felt to have LBBB CM vs chemo induced (got adriamycin). 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 No effusion -Echo 02/21: EF 35-40%, RV severely enlarged with okay function, moderate MR, severe TR with flail posterior leaflet -RHC 02/22: RA 17, PA 33/21 (25), PCWP 18, PAPi 0.6 (in setting of severe TR), PA sat 47%, 48% -S/p TVR 2/28. Intraop TEE EF 40-45%, RV mildly reduced.  VT arrest when coming off pump  -Currently on multiple pressors,  vasoplegia, w/ concern for possible septic shock. Milrinone discontinued 3/2 w/ low SVR -Today on Epi 14, NE 48, VP 0.05. Cardiac output ok, CI 2.6. Co-ox 78%. SVR remains low 760. On Broad spectrum abx. Pan-cultures NGTD.  - Continue current VP support. Add midodrine 10 tid per tube. ? Addition of steroids - Continue to follow hemodynamics off swan - Fluid overloaded, wt up 33 lb from pre-op, CVP 16. Oliguric, suspect ATN.   - Increase IV to 120 mg x 1 and assess response, may need lasix gtt.   3. Acute Hypoxic  Respiratory Failure - Extubated 3/1 - Reintubated 3/2. CXR ? B/l PNA  - PCT 2.49>>3.66 - WBC 24>>36>>34>39>31K. Covering w/ empiric abx, Vanc + zosyn  - Cultures pending  - Needs diuresis, see above   4. Pericardial effusion: -Doubt from Watchman procedure in 12/22 -Effusion appears moderate in size on echo 02/21.  -[place] 02/22 with equalization of right and left-sided pressures with no evidence of LV-RV interaction -There was no evidence of tamponade. Suspect main issue is severe TR with markedly elevated RA pressures and inability to drain pericardium.  5. PAF/tachycardia - Unable to complete ablation 10/22 d/t pericardial effusion - S/p Watchman implant 08/21/21 - continue amio gtt while on inotropes/ pressors   6. AKI on CKD IIIb/IV/ ATN - Scr peaked at 2.8-->today 1.6 -->1.9 today  - In setting of GI bleed and hypotension +/- cardiorenal  - Oliguric, suspect ATN - Increase IV Lasix to 120 mg and monitor response. May be headed towards CVVH - continue pressors to support BP, aim to keep MAPs >70 and SBP >110   6. GI bleeding - BRBPR + melena 4-5 days prior to admission - Per her report CT at OSH with diverticulosis but no diverticulitis - 1 u PRBCs this admit -EGD/Colonoscopy- R colonic ischemic ulcers - suspect in the setting of low output hf., 2 polyps removed, diverticulosis. Polyps no malignancy   - On PPI  - Follow H/Hg, hgb stable 10.9   7. OSA - Unable to tolerate CPAP  8. Hypokalemia/ hypomagnesia - K /Mag stable.   9. Ischemic colitis - suspect related to mesenteric ischemia in setting of low arterial pressure and high venous outflow pressures from RV failure - No h/o ischemic colitis symptoms - Has CT abdomen from OSH (non contrast) no documentation of ab arterial calcification  10. VT/VF -Intraoperative. Placed on amio drip.  -stable, no recurrence  -continue amio gtt -keep K >4.0 and Mg >2.0    Length of Stay: [address], PA-C   11/07/2021, 7:12 AM  Advanced Heart Failure Team Pager [phone] (M-F; 7a - 5p)  Please contact [facility] for night-coverage after hours (5p -7a ) and weekends on [email or web address]   Agree with above.   She remains intubated. Critically ill on high dose pressors and full vent support.   Swan numbers improved. Scr up but stabilizing.   General:  sedated on vent HEENT: +ETT Neck: supple. RIJ swan   Cor: Sternal dressing ok Regular rate & rhythm. No rubs, gallops or murmurs. Lungs: + crackles Abdomen: soft, nontender, nondistended. No hepatosplenomegaly. No bruits or masses. Hypoactive bowel sounds. Extremities: no cyanosis, clubbing, rash, 2+ edema fingers dusky Neuro: intubated sedated  Critically ill with combination of septic shock (aspiration PNA) and cardiogenic shock. Attempt to wean pressors slowly. Agree with gentle diuresis as hemodynamics tolerate/ Hopefully will not need CVVHD. CXR stable. Wean vent as able. Continue broad spectrum abx. Prognosis guarded.   D/w CCM  at bedside.   CRITICAL CARE Performed by: Glori Bickers  Total critical care time: 45 minutes  Critical care time was exclusive of separately billable procedures and treating other patients.  Critical care was necessary to treat or prevent imminent or life-threatening deterioration.  Critical care was time spent personally by me (independent of midlevel providers or residents) on the following activities: development of treatment plan with patient and/or surrogate as well as nursing, discussions with consultants, evaluation of patient's response to treatment, examination of patient, obtaining history from patient or surrogate, ordering and performing treatments and interventions, ordering and review of laboratory studies, ordering and review of radiographic studies, pulse oximetry and re-evaluation of patient's condition.  Glori Bickers, MD  10:45 AM

## 2021-11-07 NOTE — Procedures (Signed)
Cortrak ? ?Person Inserting Tube:  Asencion Islam, RD ?Tube Type:  Cortrak - 43 inches ?Tube Size:  10 ?Tube Location:  Left nare ?Secured by: Dorann Lodge ?Technique Used to Measure Tube Placement:  Marking at nare/corner of mouth ?Cortrak Secured At:  81 cm ? ?Cortrak Tube Team Note: ? ?Consult received to place a Cortrak feeding tube.  ? ?X-ray is required, abdominal x-ray has been ordered by the Cortrak team. Please confirm tube placement before using the Cortrak tube.  ? ?If the tube becomes dislodged please keep the tube and contact the Cortrak team at [email or web address] ([email or web address]) for replacement.  ?If after hours and replacement cannot be delayed, place a NG tube and confirm placement with an abdominal x-ray.  ? ? ?Lockie Pares., RD, LDN, CNSC ?See AMiON for contact information  ? ? ?

## 2021-11-07 NOTE — Progress Notes (Signed)
?  Remains on high-dose pressors. Marginal hemodynamics ? ?Sats have been in high 80s to low 90s. CCM now paralyzing patient due to vent dyssynchrony.  ? ?Minimal response to lasix 160 IV ? ?CVP 18-20  ? ?D/w CCM. Will continue current management for now.  ? ?Will need CVVHD in am, if not sooner.  ? ?Additional CCT 30 mins.  ? ?Glori Bickers, MD  ?6:53 PM ? ?

## 2021-11-07 NOTE — Progress Notes (Signed)
Will start NMB protocol, despite sedation double-triggering and de-recruiting.  If sats do not improve > 90%, would start veletri. ? ?Will likely need CRRT tomorrow. ? ?Erskine Emery MD PCCM ?

## 2021-11-07 NOTE — Progress Notes (Addendum)
[facility] DAILY ICU PROGRESS NOTE                   [address]            [place] [address]          [phone]   3 Days Post-Op Procedure(s) (LRB): TRICUSPID VALVE REPAIR USING EDWARDS MC3 TRICUSPID ANNULOPLASTY RING SIZE 28MM (N/A) TRANSESOPHAGEAL ECHOCARDIOGRAM (TEE) (N/A)  Total Length of Stay:  LOS: 11 days   Subjective: On vent and sedated with fentanyl and propofol. Will awaken and is responsive.  On Epi, NE, Vasopressin.  CoOx 78 at 05:30.  Only 54m UO since Lasix 823mIV earlier this morning.   Objective: Vital signs in last 24 hours: Temp:  [97.7 F (36.5 C)-100.2 F (37.9 C)] 100.2 F (37.9 C) (03/03 1030) Pulse Rate:  [75-93] 76 (03/03 1030) Cardiac Rhythm: A-V Sequential paced (03/02 2000) Resp:  [15-27] 20 (03/03 1030) BP: (83-131)/(31-67) 106/31 (03/03 1000) SpO2:  [84 %-100 %] 91 % (03/03 1030) Arterial Line BP: (26-110)/(21-72) 110/52 (03/03 1030) FiO2 (%):  [100 %] 100 % (03/03 0815) Weight:  [100.2 kg] 100.2 kg (03/03 0427)  Filed Weights   11/05/21 0400 11/06/21 0500 11/07/21 0427  Weight: 95.5 kg 99.5 kg 100.2 kg    Weight change: 0.7 kg   Hemodynamic parameters for last 24 hours: PAP: (40-56)/(15-29) 49/26 CVP:  [9 mmHg-47 mmHg] 16 mmHg CO:  [4.6 L/min-5.3 L/min] 5.3 L/min CI:  [2.4 L/min/m2-2.7 L/min/m2] 2.7 L/min/m2  Intake/Output from previous day: 03/02 0701 - 03/03 0700 In: 4651.6 [I.V.:3121.8; Blood:315; NG/GT:576.5; IV Piggyback:638.3] Out: 1235 [Urine:525; Emesis/NG output:640; Chest Tube:70]  Intake/Output this shift: Total I/O In: 483.8 [I.V.:425.1; NG/GT:30; IV Piggyback:28.7] Out: 10 [Urine:10]  Current Meds: Scheduled Meds:  acetaminophen (TYLENOL) oral liquid 160 mg/5 mL  650 mg Per Tube Q6H   aspirin EC  325 mg Oral Daily   Or   aspirin  324 mg Per Tube Daily   atorvastatin  10 mg Per Tube q AM   bisacodyl  10 mg Oral Daily   Or   bisacodyl  10 mg Rectal Daily   buPROPion  75 mg Per Tube BID    chlorhexidine gluconate (MEDLINE KIT)  15 mL Mouth Rinse BID   Chlorhexidine Gluconate Cloth  6 each Topical Daily   docusate  100 mg Per Tube BID   feeding supplement (PROSource TF)  45 mL Per Tube BID   feeding supplement (VITAL HIGH PROTEIN)  1,000 mL Per Tube Q24H   furosemide  80 mg Intravenous BID   insulin aspart  0-24 Units Subcutaneous Q4H   insulin detemir  10 Units Subcutaneous BID   mouth rinse  15 mL Mouth Rinse 10 times per day   metoCLOPramide (REGLAN) injection  5 mg Intravenous Q6H   pantoprazole sodium  40 mg Per Tube Daily   polyethylene glycol  17 g Per Tube Daily   sennosides  10 mL Per Tube BID   sodium chloride flush  10-40 mL Intracatheter Q12H   sodium chloride flush  3 mL Intravenous Q12H   venlafaxine  75 mg Per Tube BID WC   Continuous Infusions:  sodium chloride 20 mL/hr at 11/07/21 1000   sodium chloride     sodium chloride 10 mL/hr at 11/06/21 0800   sodium chloride     amiodarone 30 mg/hr (11/07/21 1000)   epinephrine 14 mcg/min (11/07/21 1000)   fentaNYL infusion INTRAVENOUS 25 mcg/hr (11/07/21 1000)  lactated ringers     midazolam 1 mg/hr (11/07/21 1000)   norepinephrine (LEVOPHED) Adult infusion 48 mcg/min (11/07/21 1000)   piperacillin-tazobactam (ZOSYN)  IV Stopped (11/07/21 3007)   propofol (DIPRIVAN) infusion Stopped (11/06/21 1800)   [START ON 11/08/2021] vancomycin     vasopressin 0.05 Units/min (11/07/21 1000)   PRN Meds:.sodium chloride, Place/Maintain arterial line **AND** sodium chloride, fentaNYL, lip balm, metoprolol tartrate, midazolam, ondansetron (ZOFRAN) IV, sodium chloride flush, sodium chloride flush  General appearance: sedated and mechanically ventilated Neurologic: unable to assess Heart: dual chamber pacing Lungs: breath sounds clear but shallow. Currently on fi)2 1.0, PEEP 16, sat 89%. CXR showing bilateral patchy densities unchanged Abdomen: soft, no obvious tenderness, absent bowel sounds.  Extremities: cool, toes are  mildly dusky, has doppler DP's Wound: the sternotomy is covered with a dry Aquacel dressing.   Lab Results: CBC: Recent Labs    11/06/21 1518 11/06/21 1700 11/07/21 0305 11/07/21 0524  WBC 39.2*  --  31.6*  --   HGB 9.9*   < > 9.7* 10.9*  HCT 31.4*   < > 30.6* 32.0*  PLT 257  --  238  --    < > = values in this interval not displayed.   BMET:  Recent Labs    11/06/21 1518 11/06/21 1700 11/07/21 0305 11/07/21 0524  NA 131*   < > 129* 134*  K 4.0   < > 3.9 4.3  CL 100  --  97*  --   CO2 21*  --  20*  --   GLUCOSE 100*  --  89  --   BUN 23  --  25*  --   CREATININE 1.95*  --  1.93*  --   CALCIUM 8.2*  --  7.9*  --    < > = values in this interval not displayed.    CMET: Lab Results  Component Value Date   WBC 31.6 (H) 11/07/2021   HGB 10.9 (L) 11/07/2021   HCT 32.0 (L) 11/07/2021   PLT 238 11/07/2021   GLUCOSE 89 11/07/2021   CHOL 130 10/28/2021   TRIG 130 11/07/2021   HDL 27 (L) 10/28/2021   LDLDIRECT 147.2 01/15/2009   LDLCALC 72 10/28/2021   ALT 9 11/07/2021   AST 98 (H) 11/07/2021   NA 134 (L) 11/07/2021   K 4.3 11/07/2021   CL 97 (L) 11/07/2021   CREATININE 1.93 (H) 11/07/2021   BUN 25 (H) 11/07/2021   CO2 20 (L) 11/07/2021   TSH 7.134 (H) 11/06/2021   INR 1.4 (H) 11/05/2021   HGBA1C 5.5 11/07/2021      PT/INR:  Recent Labs    11/05/21 0356  LABPROT 17.3*  INR 1.4*   Radiology: DG Abd 1 View  Result Date: 11/06/2021 CLINICAL DATA:  Enteric tube placement EXAM: ABDOMEN - 1 VIEW COMPARISON:  November 06, 2021 FINDINGS: Incomplete assessment of the pelvis. Air-filled nondilated loops of bowel. Enteric tube tip and side port project over the stomach. The cardial pacing wires. Partial visualization of bilateral chest tubes, PA catheter, AICD and stent graft material. Bibasilar airspace opacities better assessed on same day radiograph. Status post vertebral augmentation of L1. IMPRESSION: Enteric tube tip and side port project over the stomach.  Electronically Signed   By: Valentino Saxon M.D.   On: 11/06/2021 12:04   DG Chest Port 1 View  Result Date: 11/07/2021 CLINICAL DATA:  Endotracheally intubated. EXAM: PORTABLE CHEST 1 VIEW COMPARISON:  11/06/2021 FINDINGS: Endotracheal tube is approximately 2.3 cm  above the carina. Nasogastric tube extends into the abdomen. Right jugular central line with a Swan-Ganz catheter. Swan-Ganz catheter is in a stable position in the right hilum. Postsurgical changes from tricuspid valve surgery. There is also a left atrial appendage occlusion device present. Bilateral chest tubes. Patchy airspace densities in the mid and lower right chest have minimally changed. Residual densities in left lower lung are minimally changed. Again noted is a right cardiac ICD. Negative for pneumothorax. IMPRESSION: 1. Stable position of the support apparatuses. Endotracheal tube is appropriately positioned. 2. Bilateral chest tubes without a pneumothorax. 3. Bilateral patchy airspace densities have minimally changed. Differential diagnosis includes asymmetric pulmonary edema versus infection. Electronically Signed   By: Markus Daft M.D.   On: 11/07/2021 09:08     Assessment/Plan: S/P Procedure(s) (LRB): TRICUSPID VALVE REPAIR USING EDWARDS MC3 TRICUSPID ANNULOPLASTY RING SIZE 28MM (N/A) TRANSESOPHAGEAL ECHOCARDIOGRAM (TEE) (N/A)  -POD3 complex tricuspid valve repair- still requiring high-dose vasoactive support to maintain adequate hemodynamics. Pace rhythm with consistent capture with PPM.  -Acute on chronic renal insufficiency- Minimal UO after 83m dose IV Lasix this morning. Wt is ~15kg +. Expect she will require CVVH before any progress is made with oxygenation.    -PULM- Did no tolerate decreasing FiO2 1.0->0.8 this am. CXR showing bilateral patchy infiltrates.   -ID-leukocytosis persists.  Blood cx negative at 12 hours, no organisms seen on tracheal aspirate yesterday, Urine Cx pending. On  empiric Vanc and Zosyn  for suspected aspiration pneumonia..  -GI / Nutrition- GIB prior to admission, s/p polypectomy (benign), no evidence of further GIB.  CorTrak feeding tube being placed this morning.   -HEME- expected ABL anemia. Hct stable.   -ENDO- no h/o DM. Glucose 80-112 on Levemir 10u BID.  --DVT PPX- add daily [place] enoxaparin-   MAntony Odea PA-C 11/07/2021 10:39 AM  Patient seen and examined, agree with above Still intubated and on 100% FiO2 Ci 2.7 on Epi, vasopressin and high dose norepi Co-ox 59 Creatinine 1.93, poor response to Lasix, will probably require CRRT but pressor requirements problematic. Prognosis guarded  SRevonda Standard HRoxan Hockey MD [facility] [phone]

## 2021-11-07 NOTE — Progress Notes (Signed)
Initial Nutrition Assessment ? ?DOCUMENTATION CODES:  ? ?Not applicable ? ?INTERVENTION:  ? ?Tube Feeding via Cortrak: ?Vital AF 1.2 at 55 ml/hr ?Begin at 20 ml/hr; titrate by 10 mL q 8 hours until goal rate of 55 ml/hr ?Pro-Source TF 45 mL daily ?This provides 110 g of protein, 1624 kcals and 1069 mL of free water ? ? ?NUTRITION DIAGNOSIS:  ? ?Inadequate oral intake related to acute illness as evidenced by NPO status. ? ?GOAL:  ? ?Patient will meet greater than or equal to 90% of their needs ? ? ?MONITOR:  ? ?Vent status, Labs, Weight trends, TF tolerance ? ?REASON FOR ASSESSMENT:  ? ?Ventilator, Consult ?Enteral/tube feeding initiation and management ? ?ASSESSMENT:  ? ?66 yo female admitted with pericardial effusion, GI bleed.Pt with severe tricuspid regurgitation s/p TVR, cardiogenic shock, acute respiratory failure with ARDS secondary requiring intubation.  PMH includes CKD III/IV, CHF, HLD, GERD ? ?2/20 Transferred to [facility] for GI bleed, pericardial effusion ?2/24 EGD, Colonoscopy: right colon ischemic ulcers, polyps ?2/28 s/p TVR ?3/01 Extubated ?3/02 Re-Intubated ? ?Pt remains on vent support-100% FiO2 currently on levophed (48 mcg/min), vasopressin (0.05 units/min), epinephrine (14 mcg/min) ?MAP (aline): 60 or greater ? ?Vital High Protein at 10 ml/hr via NG tube ?Cortrak placed today ? ?Weight up post op; current wt 100.2 kg; admit weight 86.3 kg. Net +15 L ?UOP 525 mL in 24 hours ? ?Labs: sodium 134 (L) ?Meds: lasix gtt, ss novolog, levemir, reglan ? ? ?Diet Order:   ?Diet Order   ? ? None  ? ?  ? ? ?EDUCATION NEEDS:  ? ?Not appropriate for education at this time ? ?Skin:  Skin Assessment: Reviewed RN Assessment ? ?Last BM:  2/26 ? ?Height:  ? ?Ht Readings from Last 1 Encounters:  ?11/01/2021 5\' 2"  (1.575 m)  ? ? ?Weight:  ? ?Wt Readings from Last 1 Encounters:  ?11/07/21 100.2 kg  ? ? ? ?BMI:  Body mass index is 40.4 kg/m?. ? ?Estimated Nutritional Needs:  ? ?Kcal:  1550-1750 kcals ? ?Protein:  100-120  g ? ?Fluid:  1.5 L ? ? ?Kerman Passey MS, RDN, LDN, CNSC ?Registered Dietitian III ?Clinical Nutrition ?RD Pager and On-Call Pager Number Located in [place]  ? ?

## 2021-11-08 ENCOUNTER — Inpatient Hospital Stay (HOSPITAL_COMMUNITY): Payer: Medicare PPO

## 2021-11-08 DIAGNOSIS — I3139 Other pericardial effusion (noninflammatory): Secondary | ICD-10-CM | POA: Diagnosis not present

## 2021-11-08 DIAGNOSIS — J80 Acute respiratory distress syndrome: Secondary | ICD-10-CM | POA: Diagnosis not present

## 2021-11-08 DIAGNOSIS — R570 Cardiogenic shock: Secondary | ICD-10-CM

## 2021-11-08 DIAGNOSIS — N179 Acute kidney failure, unspecified: Secondary | ICD-10-CM | POA: Diagnosis not present

## 2021-11-08 LAB — COMPREHENSIVE METABOLIC PANEL
ALT: 8 U/L (ref 0–44)
AST: 87 U/L — ABNORMAL HIGH (ref 15–41)
Albumin: 1.8 g/dL — ABNORMAL LOW (ref 3.5–5.0)
Alkaline Phosphatase: 192 U/L — ABNORMAL HIGH (ref 38–126)
Anion gap: 16 — ABNORMAL HIGH (ref 5–15)
BUN: 33 mg/dL — ABNORMAL HIGH (ref 8–23)
CO2: 18 mmol/L — ABNORMAL LOW (ref 22–32)
Calcium: 7.8 mg/dL — ABNORMAL LOW (ref 8.9–10.3)
Chloride: 97 mmol/L — ABNORMAL LOW (ref 98–111)
Creatinine, Ser: 2.51 mg/dL — ABNORMAL HIGH (ref 0.44–1.00)
GFR, Estimated: 20 mL/min — ABNORMAL LOW (ref >60)
Glucose, Bld: 146 mg/dL — ABNORMAL HIGH (ref 70–99)
Potassium: 3.8 mmol/L (ref 3.5–5.1)
Sodium: 131 mmol/L — ABNORMAL LOW (ref 135–145)
Total Bilirubin: 1.2 mg/dL (ref 0.3–1.2)
Total Protein: 4.8 g/dL — ABNORMAL LOW (ref 6.5–8.1)

## 2021-11-08 LAB — PHOSPHORUS: Phosphorus: 5.4 mg/dL — ABNORMAL HIGH (ref 2.5–4.6)

## 2021-11-08 LAB — POCT I-STAT 7, (LYTES, BLD GAS, ICA,H+H)
Acid-base deficit: 8 mmol/L — ABNORMAL HIGH (ref 0.0–2.0)
Acid-base deficit: 8 mmol/L — ABNORMAL HIGH (ref 0.0–2.0)
Bicarbonate: 19.2 mmol/L — ABNORMAL LOW (ref 20.0–28.0)
Bicarbonate: 19.7 mmol/L — ABNORMAL LOW (ref 20.0–28.0)
Calcium, Ion: 1.14 mmol/L — ABNORMAL LOW (ref 1.15–1.40)
Calcium, Ion: 1.14 mmol/L — ABNORMAL LOW (ref 1.15–1.40)
HCT: 29 % — ABNORMAL LOW (ref 36.0–46.0)
HCT: 30 % — ABNORMAL LOW (ref 36.0–46.0)
Hemoglobin: 10.2 g/dL — ABNORMAL LOW (ref 12.0–15.0)
Hemoglobin: 9.9 g/dL — ABNORMAL LOW (ref 12.0–15.0)
O2 Saturation: 88 %
O2 Saturation: 98 %
Patient temperature: 38.2
Patient temperature: 99.9
Potassium: 3.8 mmol/L (ref 3.5–5.1)
Potassium: 3.8 mmol/L (ref 3.5–5.1)
Sodium: 132 mmol/L — ABNORMAL LOW (ref 135–145)
Sodium: 132 mmol/L — ABNORMAL LOW (ref 135–145)
TCO2: 21 mmol/L — ABNORMAL LOW (ref 22–32)
TCO2: 21 mmol/L — ABNORMAL LOW (ref 22–32)
pCO2 arterial: 46.8 mmHg (ref 32–48)
pCO2 arterial: 48.7 mmHg — ABNORMAL HIGH (ref 32–48)
pH, Arterial: 7.219 — ABNORMAL LOW (ref 7.35–7.45)
pH, Arterial: 7.228 — ABNORMAL LOW (ref 7.35–7.45)
pO2, Arterial: 135 mmHg — ABNORMAL HIGH (ref 83–108)
pO2, Arterial: 67 mmHg — ABNORMAL LOW (ref 83–108)

## 2021-11-08 LAB — RENAL FUNCTION PANEL
Albumin: 1.6 g/dL — ABNORMAL LOW (ref 3.5–5.0)
Anion gap: 13 (ref 5–15)
BUN: 29 mg/dL — ABNORMAL HIGH (ref 8–23)
CO2: 21 mmol/L — ABNORMAL LOW (ref 22–32)
Calcium: 7.5 mg/dL — ABNORMAL LOW (ref 8.9–10.3)
Chloride: 100 mmol/L (ref 98–111)
Creatinine, Ser: 2.05 mg/dL — ABNORMAL HIGH (ref 0.44–1.00)
GFR, Estimated: 26 mL/min — ABNORMAL LOW (ref >60)
Glucose, Bld: 212 mg/dL — ABNORMAL HIGH (ref 70–99)
Phosphorus: 3.9 mg/dL (ref 2.5–4.6)
Potassium: 3.6 mmol/L (ref 3.5–5.1)
Sodium: 134 mmol/L — ABNORMAL LOW (ref 135–145)

## 2021-11-08 LAB — COOXEMETRY PANEL
Carboxyhemoglobin: 0.8 % (ref 0.5–1.5)
Carboxyhemoglobin: 1.6 % — ABNORMAL HIGH (ref 0.5–1.5)
Methemoglobin: <0.7 % (ref 0.0–1.5)
Methemoglobin: <0.7 % (ref 0.0–1.5)
O2 Saturation: 86.5 %
O2 Saturation: 91 %
Total hemoglobin: 9.4 g/dL — ABNORMAL LOW (ref 12.0–16.0)
Total hemoglobin: 8.9 g/dL — ABNORMAL LOW (ref 12.0–16.0)

## 2021-11-08 LAB — CBC
HCT: 28.7 % — ABNORMAL LOW (ref 36.0–46.0)
Hemoglobin: 8.8 g/dL — ABNORMAL LOW (ref 12.0–15.0)
MCH: 28.5 pg (ref 26.0–34.0)
MCHC: 30.7 g/dL (ref 30.0–36.0)
MCV: 92.9 fL (ref 80.0–100.0)
Platelets: 159 10*3/uL (ref 150–400)
RBC: 3.09 MIL/uL — ABNORMAL LOW (ref 3.87–5.11)
RDW: 19.8 % — ABNORMAL HIGH (ref 11.5–15.5)
WBC: 18.5 10*3/uL — ABNORMAL HIGH (ref 4.0–10.5)
nRBC: 1.8 % — ABNORMAL HIGH (ref 0.0–0.2)

## 2021-11-08 LAB — GLUCOSE, CAPILLARY
Glucose-Capillary: 138 mg/dL — ABNORMAL HIGH (ref 70–99)
Glucose-Capillary: 155 mg/dL — ABNORMAL HIGH (ref 70–99)

## 2021-11-08 LAB — MAGNESIUM: Magnesium: 2.2 mg/dL (ref 1.7–2.4)

## 2021-11-08 LAB — PROCALCITONIN: Procalcitonin: 4.15 ng/mL

## 2021-11-08 LAB — LACTIC ACID, PLASMA: Lactic Acid, Venous: 1.4 mmol/L (ref 0.5–1.9)

## 2021-11-08 MED ORDER — SODIUM CHLORIDE 0.9 % IV SOLN
500.0000 [IU]/h | INTRAVENOUS | Status: DC
  Administered 2021-11-08 – 2021-11-16 (×11): 500 [IU]/h via INTRAVENOUS_CENTRAL
  Filled 2021-11-08 (×2): qty 2
  Filled 2021-11-08 (×2): qty 10000
  Filled 2021-11-08 (×4): qty 2
  Filled 2021-11-08: qty 10000
  Filled 2021-11-08 (×2): qty 2
  Filled 2021-11-08: qty 10000
  Filled 2021-11-08 (×2): qty 2

## 2021-11-08 MED ORDER — SODIUM CHLORIDE 0.9 % FOR CRRT: INTRAVENOUS_CENTRAL | Status: DC | PRN

## 2021-11-08 MED ORDER — HYDROCORTISONE SOD SUC (PF) 100 MG IJ SOLR
100.0000 mg | Freq: Two times a day (BID) | INTRAMUSCULAR | Status: DC
Start: 2021-11-08 — End: 2021-11-11
  Administered 2021-11-08 – 2021-11-10 (×6): 100 mg via INTRAVENOUS
  Filled 2021-11-08 (×6): qty 2

## 2021-11-08 MED ORDER — PRISMASOL BGK 4/2.5 32-4-2.5 MEQ/L EC SOLN: Status: DC

## 2021-11-08 MED ORDER — ENOXAPARIN SODIUM 30 MG/0.3ML IJ SOSY
30.0000 mg | PREFILLED_SYRINGE | INTRAMUSCULAR | Status: DC
  Administered 2021-11-08: 30 mg via SUBCUTANEOUS
  Filled 2021-11-08: qty 0.3

## 2021-11-08 MED ORDER — PIPERACILLIN-TAZOBACTAM 3.375 G IVPB 30 MIN
3.3750 g | Freq: Four times a day (QID) | INTRAVENOUS | Status: AC
  Administered 2021-11-08 – 2021-11-12 (×17): 3.375 g via INTRAVENOUS
  Filled 2021-11-08 (×34): qty 50

## 2021-11-08 MED ORDER — VITAL AF 1.2 CAL PO LIQD
1000.0000 mL | ORAL | Status: DC
Start: 2021-11-08 — End: 2021-11-13
  Administered 2021-11-08 – 2021-11-12 (×6): 1000 mL

## 2021-11-08 MED ORDER — PRISMASOL BGK 4/2.5 32-4-2.5 MEQ/L REPLACEMENT SOLN: Status: DC

## 2021-11-08 MED ORDER — HEPARIN SODIUM (PORCINE) 1000 UNIT/ML DIALYSIS
1000.0000 [IU] | INTRAMUSCULAR | Status: DC | PRN
  Administered 2021-11-08 (×2): 1400 [IU] via INTRAVENOUS_CENTRAL
  Administered 2021-11-17: 3000 [IU] via INTRAVENOUS_CENTRAL
  Filled 2021-11-08: qty 3
  Filled 2021-11-08 (×5): qty 6

## 2021-11-08 MED ORDER — METHYLENE BLUE 0.5 % INJ SOLN
100.0000 mg | Freq: Once | Status: AC
  Administered 2021-11-08: 100 mg via INTRAVENOUS
  Filled 2021-11-08: qty 20

## 2021-11-08 MED ORDER — VANCOMYCIN HCL IN DEXTROSE 1-5 GM/200ML-% IV SOLN
1000.0000 mg | INTRAVENOUS | Status: AC
  Administered 2021-11-08 – 2021-11-12 (×5): 1000 mg via INTRAVENOUS
  Filled 2021-11-08 (×5): qty 200

## 2021-11-08 MED ORDER — NEOSTIGMINE METHYLSULFATE 10 MG/10ML IV SOLN
0.2500 mg | Freq: Four times a day (QID) | INTRAVENOUS | Status: DC
  Administered 2021-11-08 – 2021-11-09 (×6): 0.25 mg via SUBCUTANEOUS
  Filled 2021-11-08 (×10): qty 0.25

## 2021-11-08 MED ORDER — SODIUM BICARBONATE 8.4 % IV SOLN
50.0000 meq | Freq: Once | INTRAVENOUS | Status: AC
  Administered 2021-11-08: 50 meq via INTRAVENOUS
  Filled 2021-11-08: qty 50

## 2021-11-08 NOTE — Progress Notes (Signed)
Pharmacy Antibiotic Note ? ?[name] is a 66 y.o. female admitted on 10/18/2021 with  aspiration pneumonia .  Pharmacy has been consulted for vancomycin and Zosyn dosing. ? ?Patient s/p TVR 2/28 extubated on 3/1, then developed increasing hypoxia requiring reintubation, increasing pressor requirements, WBCs elevated at 34> now improved 18, febrile 101.3 and with concern for aspiration PNA.  ?Started CRRT 3/4 will adjust antibiotics accordingly  ? ?Plan: ?Vancomycin 1g IV q24h ?Zosyn 3.375 IV q6h  ? ?Height: '5\' 2"'$  (157.5 cm) ?Weight: 108.6 kg (239 lb 6.7 oz) ?IBW/kg (Calculated) : 50.1 ? ?Temp (24hrs), Avg:100.2 ?F (37.9 ?C), Min:97.5 ?F (36.4 ?C), Max:101.3 ?F (38.5 ?C) ? ?Recent Labs  ?Lab 11/05/21 ?1600 11/05/21 ?2103 11/06/21 ?0502 11/06/21 ?1007 11/06/21 ?1518 11/06/21 ?1522 11/07/21 ?0305 11/08/21 ?0422 11/08/21 ?0900  ?WBC 36.3*  --  34.8*  --  39.2*  --  31.6* 18.5*  --   ?CREATININE  --  1.94* 1.86*  --  1.95*  --  1.93* 2.51*  --   ?LATICACIDVEN  --   --   --  1.5  --  1.5  --   --  1.4  ? ?  ?Estimated Creatinine Clearance: 25.2 mL/min (A) (by C-G formula based on SCr of 2.51 mg/dL (H)).   ? ?Allergies  ?Allergen Reactions  ? Buprenorphine Hcl Shortness Of Breath and Other (See Comments)  ?  Patient required emergent use of naloxone and naloxone drip in ICU for only taking Percocet 5-325 over the course of one day. She appears to have impaired metabolism of opioid medications and providers should use these medications with extreme caution.  ? Morphine And Related Shortness Of Breath, Rash and Other (See Comments)  ?  Patient required emergent use of naloxone and naloxone drip in ICU for only taking Percocet 5-325 over the course of one day. She appears to have impaired metabolism of opioid medications and providers should use these medications with extreme caution.  ?Mental status changes, also ?  ? Oxycodone Shortness Of Breath and Other (See Comments)  ?  Mental status changes and ?Patient  required emergent use of naloxone and naloxone drip in ICU for only taking Percocet 5-325 over the course of one day. She appears to have impaired metabolism of opioid medications and providers should use these medications with extreme caution.   ? Percocet [Oxycodone-Acetaminophen] Shortness Of Breath and Other (See Comments)  ?  Hospitalized-  ?Patient required emergent use of naloxone and naloxone drip in ICU for only taking Percocet 5-325 over the course of one day. She appears to have impaired metabolism of opioid medications and providers should use these medications with extreme caution.   ? ? ?Antimicrobials this admission: ?3/2 vancomycin >>  ?3/2 Zosyn >>  ?2/28 cefazolin >> 3/2 (post-op) ? ?Dose adjustments this admission: ?N/A ? ?Microbiology results: ?3/2 BCx: sent ?3/2 Sputum: sent  ?3/2 MRSA PCR: neg ? ?Bonnita Nasuti Pharm.D. CPP, BCPS ?Clinical Pharmacist ?[phone] ?11/08/2021 2:00 PM  ? ? ? ?

## 2021-11-08 NOTE — Progress Notes (Signed)
4 Days Post-Op Procedure(s) (LRB): ?TRICUSPID VALVE REPAIR USING EDWARDS MC3 TRICUSPID ANNULOPLASTY RING SIZE 28MM (N/A) ?TRANSESOPHAGEAL ECHOCARDIOGRAM (TEE) (N/A) ?Subjective: ?Remains sedated and intubated on vent. ? ?CRRT started today and keeping -50cc/hr. ? ?Objective: ?Vital signs in last 24 hours: ?Temp:  [95.9 ?F (35.5 ?C)-101.3 ?F (38.5 ?C)] 95.9 ?F (35.5 ?C) (03/04 1515) ?Pulse Rate:  [76-89] 80 (03/04 1515) ?Cardiac Rhythm: A-V Sequential paced (03/04 1200) ?Resp:  [11-25] 22 (03/04 1515) ?BP: (91-120)/(30-55) 92/53 (03/04 1500) ?SpO2:  [84 %-100 %] 100 % (03/04 1515) ?Arterial Line BP: (89-141)/(41-61) 107/49 (03/04 1515) ?FiO2 (%):  [100 %] 100 % (03/04 1451) ?Weight:  [108.6 kg] 108.6 kg (03/04 0500) ? ?Hemodynamic parameters for last 24 hours: ?PAP: (41-280)/(20-41) 45/25 ?CVP:  [15 mmHg-21 mmHg] 18 mmHg ?CO:  [4.4 L/min-5.2 L/min] 4.5 L/min ?CI:  [2.2 L/min/m2-2.5 L/min/m2] 2.3 L/min/m2 ? ?Intake/Output from previous day: ?03/03 0701 - 03/04 0700 ?In: 4090.9 [I.V.:3390; NG/GT:471.8; IV Piggyback:229] ?Out: 300 [Urine:200; Chest Tube:100] ?Intake/Output this shift: ?Total I/O ?In: 1595.5 [I.V.:1069.7; NG/GT:410; IV Piggyback:115.8] ?Out: 976 [Urine:50; Emesis/NG output:60; Other:866] ? ?General appearance: anasarca, intubated on vent ?Neurologic: heavily sedated, not responsive. ?Heart: regular rate and rhythm ?Lungs: Coarse with rhonchi bilat ?Abdomen: soft, distended, no bowel sounds normal ?Extremities: marked edema ?Wound: incision healing well. ?Chest tube output minimal, no air leak ? ?Lab Results: ?Recent Labs  ?  11/07/21 ?0305 11/07/21 ?7096 11/08/21 ?0422 11/08/21 ?0740  ?WBC 31.6*  --  18.5*  --   ?HGB 9.7*   < > 8.8* 9.9*  ?HCT 30.6*   < > 28.7* 29.0*  ?PLT 238  --  159  --   ? < > = values in this interval not displayed.  ? ?BMET:  ?Recent Labs  ?  11/07/21 ?0305 11/07/21 ?2836 11/08/21 ?0422 11/08/21 ?0740  ?NA 129*   < > 131* 132*  ?K 3.9   < > 3.8 3.8  ?CL 97*  --  97*  --   ?CO2 20*   --  18*  --   ?GLUCOSE 89  --  146*  --   ?BUN 25*  --  33*  --   ?CREATININE 1.93*  --  2.51*  --   ?CALCIUM 7.9*  --  7.8*  --   ? < > = values in this interval not displayed.  ?  ?PT/INR: No results for input(s): LABPROT, INR in the last 72 hours. ?ABG ?   ?Component Value Date/Time  ? PHART 7.228 (L) 11/08/2021 0740  ? HCO3 19.2 (L) 11/08/2021 0740  ? TCO2 21 (L) 11/08/2021 0740  ? ACIDBASEDEF 8.0 (H) 11/08/2021 0740  ? O2SAT 98 11/08/2021 0740  ? ?CBG (last 3)  ?Recent Labs  ?  11/07/21 ?2338 11/08/21 ?6294 11/08/21 ?0741  ?GLUCAP 144* 138* 155*  ? ?CXR: bilateral air space disease c/w ARDS ? ?KUB: ascending colon distended with gas. Stomach distended with gas. ? ? ?Assessment/Plan: ?S/P Procedure(s) (LRB): ?TRICUSPID VALVE REPAIR USING EDWARDS MC3 TRICUSPID ANNULOPLASTY RING SIZE 28MM (N/A) ?TRANSESOPHAGEAL ECHOCARDIOGRAM (TEE) (N/A) ? ?Hemodynamically labile on multiple high dose vasopressors including Epi 15, NE 18, vaso 0.04. received 100 mg methylene blue today with some improvement in BP. CI 2.3  with SVR 1066. CVP 18 PA 45/25. ? ?VDRF due to ARDS/pneumonia/pulmonary edema. Sedated on versed and fentanyl and prn paralytic for desynchrony. ? ?Oligoanuric renal failure. CRRT started today to remove volume.  ? ?Tube feeds started for nutrition. ? ?Fever and leukocytosis: on Vanc and Zosyn. All cultures negative  so far. ? ? LOS: 12 days  ? ? ?Gaye Pollack ?11/08/2021 ? ? ?

## 2021-11-08 NOTE — Significant Event (Signed)
?  Asked to see patient for placement of HD cath for CRRT initiation. ? ?Multiple attempts to reach pt spouse for written consent for HD cath placement (mobile and home numbers) -- unable to reach. ? ?D/w Dr. Tamala Julian who discussed likelihood of CRRt need / HD catheter placement yesterday, to which pt spouse was amenable.  ?Given emergent need for CRRT, will proceed with HD catheter placement.  ? ? ? ?Eliseo Gum MSN, AGACNP-BC ?[facility] ?Amion for pager ?11/08/2021, 7:54 AM ? ?

## 2021-11-08 NOTE — Progress Notes (Signed)
Advanced Heart Failure Rounding Note  PCP-Cardiologist: None   Subjective:   02/20: Transferred to [facility] for GI bleed + pericardial effusion 02/22: R/LHC with normal cors, equalized R and L sided pressures with no evidence of LV-RV interaction, RA 17, PA 33/21 (25), Fick CO/CI 4.2/2.2, severely reduced PAPi (in setting of severe TR), PA sat 47%, 48% 2/24: S/P Two polyps in the distal ascending colon, removed with a cold snare. Resected and retrieved. Right colonic ischemic ulcers (healing, no active bleeding, biopsied)-likely etiology of LGI. Moderate sigmoid diverticulosis. 2/25: Diuresed with IV lasix.  2/26: Switched to torsemide 60 /60 .  2/28: S/P TVR . Off pump had VT/VT when PA catheter placed. Had cardiac massage. Cardioverted x2. Unsuccessful. Placed back on pump with restoration of SR. Started on IV amiodarone.  3/1: Extubated 3/2: Reintubated for acute hypoxic respiratory failure. Stat CXR ? Possible b/l PNA. Concern for developing septic shock, WBC 39K. PCT 2.49. Increased pressor requirements w/ NE and VP. Milrinone discontinued w/ low SVR. Several rounds of bicarb for persistent metabolic acidosis. Started on vanc + zosyn. Transfused 1u RBCs 3/4: To initiate CVVH  This morning, remains intubated, FiO2 100%. CXR with bilateral airspace opacities.   On Epi 15, NE 40, VP 0.05. Remains off milrinone. MAP around 65.  ABG 7.23/47/135/08%.   Remains on Vanc and Zosyn. mTemp overnight 100.8. WBC downtrending, 39>>31>>18.5K but PCT trending up, 2.49>>3.66>>4.15. Cultures NGTD.   Creatinine up to 2.5 with no significant response to high dose IV Lasix.  HCO3 low at 18. Weight continues to rise.   Swan #s CVP 20 PAP 47/25 CI 2.4 PAPi 1.08, worse than yesterday   Co-ox 86% SVR 888  Intubated and sedated.   Objective:   Weight Range: 108.6 kg Body mass index is 43.79 kg/m.   Vital Signs:   Temp:  [99.7 F (37.6 C)-101.3 F (38.5 C)] 100.8 F (38.2 C) (03/04 0735) Pulse  Rate:  [75-88] 82 (03/04 0735) Resp:  [11-25] 22 (03/04 0735) BP: (98-120)/(30-53) 107/50 (03/04 0735) SpO2:  [84 %-100 %] 98 % (03/04 0735) Arterial Line BP: (89-116)/(41-61) 107/50 (03/04 0700) FiO2 (%):  [100 %] 100 % (03/04 0735) Weight:  [108.6 kg] 108.6 kg (03/04 0500) Last BM Date : 11/02/21  Weight change: Filed Weights   11/06/21 0500 11/07/21 0427 11/08/21 0500  Weight: 99.5 kg 100.2 kg 108.6 kg    Intake/Output:   Intake/Output Summary (Last 24 hours) at 11/08/2021 0857 Last data filed at 11/08/2021 0630 Gross per 24 hour  Intake 3430.57 ml  Output 290 ml  Net 3140.57 ml      Physical Exam   CVP 20 General: Sedated on vent.  Neck: JVP 16+, no thyromegaly or thyroid nodule.  Lungs: Decreased bilaterally.  CV: Nondisplaced PMI.  Heart regular S1/S2, no S3/S4, no murmur.  1+ edema to knees.  Abdomen: Soft, no hepatosplenomegaly, no distention.  Skin: Intact without lesions or rashes.  Neurologic: Sedated on vent Extremities: No clubbing or cyanosis.  HEENT: Normal.    Telemetry   AV paced 80s. No further VT /VF. Personally reviewed.    Labs    CBC Recent Labs    11/07/21 0305 11/07/21 0524 11/08/21 0422 11/08/21 0740  WBC 31.6*  --  18.5*  --   HGB 9.7*   < > 8.8* 9.9*  HCT 30.6*   < > 28.7* 29.0*  MCV 90.3  --  92.9  --   PLT 238  --  159  --    < > =  values in this interval not displayed.   Basic Metabolic Panel Recent Labs    11/07/21 0305 11/07/21 0524 11/08/21 0422 11/08/21 0740  NA 129*   < > 131* 132*  K 3.9   < > 3.8 3.8  CL 97*  --  97*  --   CO2 20*  --  18*  --   GLUCOSE 89  --  146*  --   BUN 25*  --  33*  --   CREATININE 1.93*  --  2.51*  --   CALCIUM 7.9*  --  7.8*  --   MG 2.4  --  2.2  --   PHOS 5.0*  --  5.4*  --    < > = values in this interval not displayed.   Liver Function Tests Recent Labs    11/07/21 0305 11/08/21 0422  AST 98* 87*  ALT 9 8  ALKPHOS 143* 192*  BILITOT 0.8 1.2  PROT 4.9* 4.8*  ALBUMIN  2.1* 1.8*   No results for input(s): LIPASE, AMYLASE in the last 72 hours. Cardiac Enzymes No results for input(s): CKTOTAL, CKMB, CKMBINDEX, TROPONINI in the last 72 hours.  BNP: BNP (last 3 results) Recent Labs    02/24/21 1357 07/16/21 1145 11/01/2021 2138  BNP 326.4* 523.7* 477.9*    ProBNP (last 3 results) No results for input(s): PROBNP in the last 8760 hours.   D-Dimer No results for input(s): DDIMER in the last 72 hours. Hemoglobin A1C Recent Labs    11/07/21 0305  HGBA1C 5.5   Fasting Lipid Panel Recent Labs    11/07/21 0305  TRIG 130    Thyroid Function Tests Recent Labs    11/06/21 0502  TSH 7.134*    Other results:   Imaging    DG Chest Port 1 View  Result Date: 11/08/2021 CLINICAL DATA:  66 year old female with history of ARDS. EXAM: PORTABLE CHEST 1 VIEW COMPARISON:  Chest x-ray 05/10/2022. FINDINGS: An endotracheal tube is in place with tip 3.0 cm above the carina. A feeding tube is seen extending into the abdomen, however, the tip of the feeding tube extends below the lower margin of the image. Bilateral chest tubes are in position with tips and side ports in the mid thorax bilaterally, similar to the prior study. Right internal jugular Cordis through which a Swan-Ganz catheter has been passed into a branch of the right pulmonary artery. Right-sided pacemaker/AICD with lead tips projecting over the expected location of the right atrium and right ventricle. Status post median sternotomy. Left atrial appendage occluding device noted. Lung volumes are low. Widespread ill-defined airspace opacities and interstitial prominence throughout the lungs bilaterally, with multiple air bronchograms. Overall, aeration is very similar to the prior examination, best in the lung apices. Small bilateral pleural effusions. Pulmonary vasculature is obscured. Heart size is mildly enlarged. Upper mediastinal contours are within normal limits. IMPRESSION: 1. Stable  radiographic appearance of the chest, as above, with imaging findings which could be compatible with reported clinical history of ARDS. Electronically Signed   By: Vinnie Langton M.D.   On: 11/08/2021 07:36   DG CHEST PORT 1 VIEW  Result Date: 11/07/2021 CLINICAL DATA:  Endotracheal intubation EXAM: PORTABLE CHEST 1 VIEW COMPARISON:  11/07/2021 FINDINGS: Support Apparatus: --Endotracheal tube: Tip at the level of the clavicular heads. --Enteric tube:Tip below the field of view --Vascular catheter(s):Swan-Ganz catheter tip in the right pulmonary artery --Other: Unchanged position of chest tubes. Mild cardiomegaly with mild pulmonary edema, greatest at the right  base. No pneumothorax. Unchanged AICD and median sternotomy wires. IMPRESSION: 1. Endotracheal tube tip at the level of the clavicular heads. 2. Swan-Ganz catheter tip in the right pulmonary artery. Electronically Signed   By: Ulyses Jarred M.D.   On: 11/07/2021 23:17   DG Abd Portable 1V  Result Date: 11/07/2021 CLINICAL DATA:  Feeding tube placement EXAM: PORTABLE ABDOMEN - 1 VIEW COMPARISON:  None. FINDINGS: Feeding tube with the tip projecting over the antrum of the stomach. No bowel dilatation to suggest obstruction. No evidence of pneumoperitoneum, portal venous gas or pneumatosis. No pathologic calcifications along the expected course of the ureters. No acute osseous abnormality. IMPRESSION: 1. Feeding tube with the tip projecting over the antrum of the stomach. Electronically Signed   By: Kathreen Devoid M.D.   On: 11/07/2021 12:04     Medications:     Scheduled Medications:  acetaminophen (TYLENOL) oral liquid 160 mg/5 mL  650 mg Per Tube Q6H   artificial tears  1 application Both Eyes C9S   aspirin EC  325 mg Oral Daily   Or   aspirin  324 mg Per Tube Daily   atorvastatin  10 mg Per Tube q AM   bisacodyl  10 mg Oral Daily   Or   bisacodyl  10 mg Rectal Daily   buPROPion  75 mg Per Tube BID   chlorhexidine gluconate (MEDLINE KIT)   15 mL Mouth Rinse BID   Chlorhexidine Gluconate Cloth  6 each Topical Daily   docusate  100 mg Per Tube BID   enoxaparin (LOVENOX) injection  40 mg Subcutaneous Q24H   feeding supplement (PROSource TF)  45 mL Per Tube BID   hydrocortisone sod succinate (SOLU-CORTEF) inj  100 mg Intravenous Q12H   insulin aspart  0-24 Units Subcutaneous Q4H   insulin detemir  10 Units Subcutaneous BID   mouth rinse  15 mL Mouth Rinse 10 times per day   metoCLOPramide (REGLAN) injection  5 mg Intravenous Q6H   pantoprazole sodium  40 mg Per Tube Daily   polyethylene glycol  17 g Per Tube Daily   sennosides  10 mL Per Tube BID   sodium chloride flush  10-40 mL Intracatheter Q12H   sodium chloride flush  3 mL Intravenous Q12H   venlafaxine  75 mg Per Tube BID WC    Infusions:   prismasol BGK 4/2.5      prismasol BGK 4/2.5     sodium chloride 20 mL/hr at 11/07/21 1800   sodium chloride     sodium chloride 10 mL/hr at 11/06/21 0800   sodium chloride     amiodarone 30 mg/hr (11/08/21 0630)   epinephrine 15 mcg/min (11/08/21 0630)   epoprostenol (VELETRI) for inhalation 1.95m/50mL (30,000 ng/mL) 50 ng/kg/min (11/08/21 0433)   feeding supplement (VITAL AF 1.2 CAL) 40 mL/hr at 11/08/21 0619   fentaNYL infusion INTRAVENOUS 175 mcg/hr (11/08/21 0802)   heparin 10,000 units/ 20 mL infusion syringe     lactated ringers     methylene blue IVPB     midazolam 7 mg/hr (11/08/21 0630)   norepinephrine (LEVOPHED) Adult infusion 40 mcg/min (11/08/21 0630)   piperacillin-tazobactam (ZOSYN)  IV 12.5 mL/hr at 11/08/21 0630   prismasol BGK 4/2.5     propofol (DIPRIVAN) infusion Stopped (11/06/21 1800)   vancomycin     vasopressin 0.05 Units/min (11/08/21 0630)     PRN Medications: sodium chloride, Place/Maintain arterial line **AND** sodium chloride, fentaNYL, heparin, lip balm, metoprolol tartrate, midazolam, ondansetron (ZOFRAN) IV,  rocuronium bromide, sodium chloride, sodium chloride flush, sodium chloride  flush     Patient Profile    66 y.o. female with h/o PAF with recent failed ablation S/p Watchman implant 08/21/21,  breast CA in 1998 s/p left breast surgery and s/p Adriamycin x 4 cycles, non-ischemic cardiomyopathy/chronic systolic CHF, LBBB, s/p BiV Medtronic ICD, HTN, OSA, and CKD Stage IIIb. Transferred from OSH for acute GIB and pericardial effusion. Initial Echo here showed EF 35-40% RV severely enlarged w/ low normal systolic fx. Moderate posterior effusion, no tamponade. Severe TR noted. R/LCH showed normal cors, LVEF 40%, Equalized right and left-sided pressure with no evidence of LV-RV interaction and evidence of severe RV failure w/ severely reduced PAPi, 0.6, in setting of severe TR. Went to OR 2/28 for TVR. Off pump had VT/VF when PA catheter placed. Had cardiac massage. Cardioverted x2. Unsuccessful. Placed back on pump with restoration of SR. Started on IV amiodarone.   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 Acute on Chronic Systolic CHF w/ Post Cardiotomy Shock  - felt to have LBBB CM vs chemo induced (got adriamycin). 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 No effusion -Echo 02/21: EF 35-40%, RV severely enlarged with okay function, moderate MR, severe TR with flail posterior leaflet -RHC 02/22: RA 17, PA 33/21 (25), PCWP 18, PAPi 0.6 (in setting of severe TR), PA sat 47%, 48% -S/p TVR 2/28. Intraop TEE EF 40-45%, RV mildly reduced.  VT arrest when coming off pump  - Milrinone discontinued 3/2 w/ low SVR - Mixed septic/cardiogenic shock.  CI 2.4 today with SVR still relatively low at 888.  PAPI lower at 1.08.  Today on Epi 15, NE 40, VP 0.05.  - On Broad spectrum vanc/Zosyn. Pan-cultures NGTD.  -  Continue to follow hemodynamics off swan - Marked volume overload with CVP 20, not responding to diuretics.  Will start CVVH today, discussed with nephrology.    3. Acute Hypoxic Respiratory Failure - Extubated 3/1 - Reintubated 3/2. CXR ? B/l PNA  - PCT 2.49>>3.66>>4.15 - WBC 24>>36>>34>39>31>>18.5K. Covering w/ empiric abx, Vanc + zosyn  - Cultures NGTD - CXR with bilateral diffuse infiltrates, suspect PNA + volume overload/pulmonary edema. FiO2 100%, needs fluid off.  - Start CVVH today.   4. Pericardial effusion: -Doubt from Watchman procedure in 12/22 -Effusion appears moderate in size on echo 02/21.  -[place] 02/22 with equalization of right and left-sided pressures with no evidence of LV-RV interaction -There was no evidence of tamponade. Suspect main issue is severe TR with markedly elevated RA pressures and inability to drain pericardium.  5. PAF/tachycardia - Unable to complete ablation 10/22 d/t pericardial effusion - S/p Watchman implant 08/21/21 - continue amio gtt 30 mg/hr while on inotropes/ pressors  - Has not been on heparin gtt (prophylactic enoxaparin post-op).   6. AKI on CKD IIIb/IV/ ATN - Creatinine up to 2.5, oliguric nearing anuric. Suspect ATN.  - In setting of GI bleed and hypotension +/- cardiorenal  - Marked volume overload with no response to IV diuretics => CVVH today.   6. GI bleeding - BRBPR + melena 4-5 days prior to admission - Per her report CT at OSH with diverticulosis but no diverticulitis - 1 u  PRBCs this admit -EGD/Colonoscopy- R colonic ischemic ulcers - suspect in the setting of low output hf., 2 polyps removed, diverticulosis. Polyps no malignancy   - On PPI  - Follow H/Hg, hgb 8.8, transfuse < 8.   7. OSA - Unable to tolerate CPAP  8. Hypokalemia/ hypomagnesia - K /Mag stable.   9. Ischemic colitis - suspect related to mesenteric ischemia in setting of low arterial pressure and high venous outflow pressures from RV failure - No h/o  ischemic colitis symptoms - Has CT abdomen from OSH (non contrast) no documentation of ab arterial calcification - No bowel sounds today.   10. VT/VF -Intraoperative. Placed on amio drip.  -stable, no recurrence  -continue amio gtt -keep K >4.0 and Mg >2.0  Prognosis appears increasingly poor. On high dose pressors with mixed septic/cardiogenic shock and RV dysfunction, gut function questionable, AKI with need to start CVVH.  Will need family discussions re: future plan.   CRITICAL CARE Performed by: Loralie Champagne  Total critical care time: 45 minutes  Critical care time was exclusive of separately billable procedures and treating other patients.  Critical care was necessary to treat or prevent imminent or life-threatening deterioration.  Critical care was time spent personally by me on the following activities: development of treatment plan with patient and/or surrogate as well as nursing, discussions with consultants, evaluation of patient's response to treatment, examination of patient, obtaining history from patient or surrogate, ordering and performing treatments and interventions, ordering and review of laboratory studies, ordering and review of radiographic studies, pulse oximetry and re-evaluation of patient's condition.  Length of Stay: 20  Loralie Champagne, MD  11/08/2021, 8:57 AM  Advanced Heart Failure Team Pager [phone] (M-F; 7a - 5p)  Please contact [facility] for night-coverage after hours (5p -7a ) and weekends on [email or web address]

## 2021-11-08 NOTE — Significant Event (Deleted)
Family at bedside. ? ?Discussed terminal clinical status with worsening renal failure, inability to wean from paralytics. ? ?They agree she would not want chest compressions should she deteriorate further. ? ?Family still coming in to say goodbye then will likely transition to comfort. ? ?Continue aggressive care in interim. ? ?Erskine Emery MD PCCM ?

## 2021-11-08 NOTE — Progress Notes (Addendum)
eLink Physician-Brief Progress Note ?Patient Name: DALTON MILLE ?DOB: [birth year] ?MRN: [id number] ? ? ?Date of Service ? 11/08/2021  ?HPI/Events of Note ? 66 y/o F with severe CHF, p/w GIB  + pericardial effusion. Echo showed equalized R and L sided pressures, severe TR ? ?Polyps in the distal ascending colon were removed , found to have  Right colonic ischemic ulcers  ? ?Has been followed by HF team with aggressive diuresis, Lasix/Torsemide, now plan is for CRRT in AM.  Already S/P TVR . Off pump had VT/VT now on IV amiodarone.  ? ?Extubated 3/1, Reintubated 3/2, now going into progressive ARDS. Possible b/l PNA, has had increasing  pressor requirements w/ NE and VP. Milrinone discontinued w/ low SVR.  Started on vanc + zosyn. Transfused 1u RBCs ?  ?On Epi ,  NE ,  VP , Veletri was started with initial improvement though seems to have waned.  ? ?Upon turning overnight, she desaturated to low 80's, persistently there.   ?eICU Interventions ? - increase PEEP from 16-18 or up to 24 if needed ?- given the  high BMI, this is probable atelectatic de-recruitment  ? ?- TV only 380, Plateau pressure 36, but we can target Driving pressure instead and allow higher TV- Currently on TV 400-420 her DP = 15 which is acceptable.  ? ?-ETT still high- will push down 2-3 cm  ? ?-running out of vent options if above unsuccessful consider prone or proceed to CRRT overnight, will d/w ground team   ? ? ? ? Ravi Tasheba Henson ?11/08/2021, 12:15 AM ?

## 2021-11-08 NOTE — Progress Notes (Addendum)
eLink Physician-Brief Progress Note ?Patient Name: [name] ?DOB: 03/27/1956 ?MRN: [id number] ? ? ?Date of Service ? 11/08/2021  ?HPI/Events of Note ? Bedside requesting CXR to confirm ETT placement because cuff was leaking and ETT moved & had to be repositioned . ? ?CTVS: notes: ?on multiple high dose vasopressors including Epi 15, NE 18, vaso 0.04. received 100 mg methylene blue today with some improvement in BP. CI 2.3  with SVR 1066. CVP 18 PA 45/25. ?  ?VDRF due to ARDS/pneumonia/pulmonary edema. Sedated on versed and fentanyl and prn paralytic for desynchrony. ?  ?Oligoanuric renal failure. CRRT started today to remove volume  ?eICU Interventions ? CxR ordered.  ? ? ? ?Intervention Category ?Intermediate Interventions: Other: (for CxR for ET tube verification) ? ?22:36 ?CxR film seen. ?ET about 1.5 cm above carina, NG tube going below diaphragm, tip not seen. ? ?Elmer Sow ?11/08/2021, 8:57 PM ?

## 2021-11-08 NOTE — Consult Note (Signed)
[name] °Admit Date: 10/30/2021 °11/08/2021 °[name] °Requesting Physician:  D Smith MD ° °Reason for Consult:  AKI, AHRF/ARDS, Shock °HPI:  °65F admitted 2/20 after transferring to [facility] with GI bleed and pericardial effusion.  Endoscopy identified likely right colonic ischemic ulcers as cause of bleeding.  She was found to have severe right-sided heart failure,/TR and on 2/28 underwent tricuspid valve repair complicated by V. tach arrest.  On 3/1 she was extubated but developed progressive acute hypoxic respiratory failure, shock likely mixed including cardiogenic and sepsis.  She is on broad-spectrum antibiotics.  She now is on vasopressin, epinephrine, norepinephrine.  She was requiring full ventilator support. ° °Creatinine is increased to 2.5, as below.  She is progressively oliguric, approaching anuria.  She is at least 19 L positive from presentation.  And weights have increased by 22 kg. ° °Current labs include potassium of 2.8, creatinine 2.5, BUN 33, phosphorus 5.4.  Leukocytosis at 18.5, improved ° ° °PMH Incudes: °Remote history of breast cancer and Adriamycin exposure, possible cause of NICM °CKD 3/4, labile creatinine it appears most recent baseline around 2.2-2.3 °History of A-fib status post Watchman 09/2021 °AICD present ° ° °Creatinine, Ser (mg/dL)  °Date Value  °11/08/2021 2.51 (H)  °11/07/2021 1.93 (H)  °11/06/2021 1.95 (H)  °11/06/2021 1.86 (H)  °11/05/2021 1.94 (H)  °11/05/2021 1.59 (H)  °10/24/2021 1.67 (H)  °10/14/2021 1.20 (H)  °10/26/2021 1.30 (H)  °10/21/2021 1.40 (H)  °] °I/Os: °I/O last 3 completed shifts: °In: 5644.1 [I.V.:5108.1; NG/GT:170; IV Piggyback:366.1] °Out: 905 [Urine:525; Emesis/NG output:240; Chest Tube:140]  ° °ROS °Unable to complete review of systems, patient intubated, sedated,  ° °PMH  °Past Medical History:  °Diagnosis Date  ° AICD (automatic cardioverter/defibrillator) present   ° Biventricular implantable cardiac defibrillator)   ° Medtronic  ° Breast  cancer (HCC) 1998  ° - left  ° CHF (congestive heart failure) (HCC)   ° takes Lasix and Aldactone daily  ° Chronic kidney disease   ° Complication of anesthesia   ° n/v, none at last SURGERY-2022.  ° GERD (gastroesophageal reflux disease)   ° takes Omeprazole daily  ° Gout   ° takes Allopurinol daily  ° History of bronchitis 2 yrs ago  ° History of shingles   ° Hyperlipidemia   ° takes Simvastatin daily  ° Insomnia   ° takes Ambien nightly  ° Nonischemic cardiomyopathy (HCC)   ° EF 55% echo 2012  ° NSVT (nonsustained ventricular tachycardia)   ° Obesity   ° OSA (obstructive sleep apnea)   ° Unable to wear CPAP  ° Pneumonia 2005  ° Presence of permanent cardiac pacemaker   ° Shortness of breath dyspnea   ° with exertion  ° sprint Fidelis   ° °PSH  °Past Surgical History:  °Procedure Laterality Date  ° ABDOMINAL HYSTERECTOMY    ° APPENDECTOMY  09/07/1976  ° ATRIAL FIBRILLATION ABLATION N/A 06/30/2021  ° Procedure: ATRIAL FIBRILLATION ABLATION;  Surgeon: Lambert, Cameron T, MD;  [facility];  Service: Cardiovascular;  Laterality: N/A;  ° BIOPSY  10/21/2021  ° Procedure: BIOPSY;  Surgeon: Gupta, Rajesh, MD;  [facility]: [facility];  Service: Gastroenterology;;  ° BiV ICD  09/07/2005  ° Medtronic  ° BIV ICD GENERTAOR CHANGE OUT N/A 12/16/2011  ° Procedure: BIV ICD GENERTAOR CHANGE OUT;  Surgeon: Steven C Klein, MD;  [facility]: [facility];  Service: Cardiovascular;  Laterality: N/A;  ° CHOLECYSTECTOMY  09/07/2017  ° COLONOSCOPY    °   COLONOSCOPY WITH PROPOFOL N/A 10/13/2021  ° Procedure: COLONOSCOPY WITH PROPOFOL;  Surgeon: Gupta, Rajesh, MD;  [facility];  Service: Gastroenterology;  Laterality: N/A;  ° cyst removed from wrist Left   ° early 70's  ° ESOPHAGOGASTRODUODENOSCOPY (EGD) WITH PROPOFOL N/A 10/08/2021  ° Procedure: ESOPHAGOGASTRODUODENOSCOPY (EGD) WITH PROPOFOL;  Surgeon: Gupta, Rajesh, MD;  [facility];  Service: Gastroenterology;  Laterality: N/A;  ° ICD LEAD REMOVAL Right  10/18/2014  ° Procedure: ICD LEAD REMOVAL/EXTRACTION/REIMPLANTATION;  Surgeon: Gregg W Taylor, MD;  [facility];  Service: Cardiovascular;  Laterality: Right;  ° KYPHOPLASTY N/A 06/06/2021  ° Procedure: KYPHOPLASTY LUMBAR ONE;  Surgeon: Dawley, Troy C, DO;  [facility];  Service: Neurosurgery;  Laterality: N/A;  ° LEFT ATRIAL APPENDAGE OCCLUSION N/A 08/21/2021  ° Procedure: LEFT ATRIAL APPENDAGE OCCLUSION;  Surgeon: Lambert, Cameron T, MD;  [facility];  Service: Cardiovascular;  Laterality: N/A;  ° MASTECTOMY Left 09/07/1996  ° POLYPECTOMY  10/19/2021  ° Procedure: POLYPECTOMY;  Surgeon: Gupta, Rajesh, MD;  [facility];  Service: Gastroenterology;;  ° port a cath placed  09/07/1996  ° port removed  09/07/1996  ° RIGHT HEART CATH N/A 06/20/2018  ° Procedure: RIGHT HEART CATH;  Surgeon: Bensimhon, Daniel R, MD;  [facility];  Service: Cardiovascular;  Laterality: N/A;  ° RIGHT/LEFT HEART CATH AND CORONARY ANGIOGRAPHY N/A 10/25/2021  ° Procedure: RIGHT/LEFT HEART CATH AND CORONARY ANGIOGRAPHY;  Surgeon: Bensimhon, Daniel R, MD;  [facility];  Service: Cardiovascular;  Laterality: N/A;  ° TEE WITHOUT CARDIOVERSION N/A 08/21/2021  ° Procedure: TRANSESOPHAGEAL ECHOCARDIOGRAM (TEE);  Surgeon: Lambert, Cameron T, MD;  [facility];  Service: Cardiovascular;  Laterality: N/A;  ° TEE WITHOUT CARDIOVERSION N/A 10/07/2021  ° Procedure: TRANSESOPHAGEAL ECHOCARDIOGRAM (TEE);  Surgeon: Croitoru, Mihai, MD;  [facility];  Service: Cardiovascular;  Laterality: N/A;  ° TEE WITHOUT CARDIOVERSION N/A 10/25/2021  ° Procedure: TRANSESOPHAGEAL ECHOCARDIOGRAM (TEE);  Surgeon: Vantrigt, Peter, MD;  [facility];  Service: Open Heart Surgery;  Laterality: N/A;  ° TRICUSPID VALVE REPLACEMENT N/A 11/03/2021  ° Procedure: TRICUSPID VALVE REPAIR USING EDWARDS MC3 TRICUSPID ANNULOPLASTY RING SIZE 28MM;  Surgeon: Vantrigt, Peter, MD;  [facility];   Service: Open Heart Surgery;  Laterality: N/A;  ° °FH  °Family History  °Problem Relation Age of Onset  ° Diabetes Mother   ° Heart disease Father   ° Alzheimer's disease Father   ° °SH  reports that she has never smoked. She has never used smokeless tobacco. She reports that she does not drink alcohol and does not use drugs. °Allergies  °Allergies  °Allergen Reactions  ° Buprenorphine Hcl Shortness Of Breath and Other (See Comments)  °  Patient required emergent use of naloxone and naloxone drip in ICU for only taking Percocet 5-325 over the course of one day. She appears to have impaired metabolism of opioid medications and providers should use these medications with extreme caution.  ° Morphine And Related Shortness Of Breath, Rash and Other (See Comments)  °  Patient required emergent use of naloxone and naloxone drip in ICU for only taking Percocet 5-325 over the course of one day. She appears to have impaired metabolism of opioid medications and providers should use these medications with extreme caution.  °Mental status changes, also °  ° Oxycodone Shortness Of Breath and Other (See Comments)  °  Mental status changes and °Patient required emergent use of naloxone and naloxone drip   in ICU for only taking Percocet 5-325 over the course of one day. She appears to have impaired metabolism of opioid medications and providers should use these medications with extreme caution.    Percocet [Oxycodone-Acetaminophen] Shortness Of Breath and Other (See Comments)    Hospitalized-  Patient required emergent use of naloxone and naloxone drip in ICU for only taking Percocet 5-325 over the course of one day. She appears to have impaired metabolism of opioid medications and providers should use these medications with extreme caution.    Home medications Prior to Admission medications   Medication Sig Start Date End Date Taking? Authorizing Provider  alendronate (FOSAMAX) 70 MG tablet Take 70 mg by mouth every Monday.    Yes [provider]  allopurinol (ZYLOPRIM) 100 MG tablet Take 100 mg by mouth in the morning. 03/18/18  Yes [provider]  amiodarone (PACERONE) 200 MG tablet Take 200 mg by mouth daily.   Yes [provider]  atorvastatin (LIPITOR) 10 MG tablet Take 10 mg by mouth in the morning.   Yes [provider]  bisoprolol (ZEBETA) 10 MG tablet Take 10 mg by mouth in the morning.   Yes [provider]  buPROPion (WELLBUTRIN SR) 150 MG 12 hr tablet Take 150 mg by mouth 2 (two) times daily. 04/18/21  Yes [provider]  Cholecalciferol (VITAMIN D3) 5000 units TABS Take 5,000 Units by mouth in the morning.   Yes [provider]  clopidogrel (PLAVIX) 75 MG tablet Take 1 tablet (75 mg total) by mouth daily. 10/07/21 04/05/22 Yes Kathyrn Drown D, NP  Cyanocobalamin (VITAMIN B-12) 5000 MCG SUBL Place 5,000 mcg under the tongue in the morning.   Yes [provider]  JARDIANCE 10 MG TABS tablet TAKE 1 TABLET BY MOUTH DAILY BEFORE BREAKFAST. Patient taking differently: Take 10 mg by mouth daily before breakfast. 10/21/21  Yes Larey Dresser, MD  metolazone (ZAROXOLYN) 2.5 MG tablet Take 2.5 mg by mouth See admin instructions. Take 2.5 mg by mouth in the morning on Tuesdays and Thursdays only   Yes [provider]  Multiple Vitamins-Minerals (ONE-A-DAY WOMENS 50+) TABS Take 1 tablet by mouth daily with breakfast.   Yes [provider]  pantoprazole (PROTONIX) 40 MG tablet Take 1 tablet (40 mg total) by mouth daily. Patient taking differently: Take 40 mg by mouth daily before breakfast. 08/22/21  Yes Kathyrn Drown D, NP  potassium chloride SA (KLOR-CON M) 20 MEQ tablet Take 40 mEq by mouth See admin instructions. Take 40 mEq by mouth in the morning, 40 mEq at lunchtime, and 40 mEq between 3 PM-4 PM daily   Yes [provider]  spironolactone (ALDACTONE) 25 MG tablet Take 25 mg by mouth at bedtime.   Yes [provider]  torsemide (DEMADEX) 20 MG tablet Take 2 tablets (40 mg total) by mouth 2 (two) times daily. Patient taking differently: Take 40 mg by mouth See admin instructions. Take 40 mg by mouth in the morning, 40 mg at lunchtime, and 40 mg between 3 PM-4 PM daily 10/16/21  Yes Bensimhon, Shaune Pascal, MD  venlafaxine XR (EFFEXOR-XR) 150 MG 24 hr capsule Take 150 mg by mouth daily with breakfast.   Yes [provider]  zolpidem (AMBIEN) 10 MG tablet Take 10 mg by mouth at bedtime.   Yes [provider]  bisoprolol (ZEBETA) 5 MG tablet Take 1 tablet (5 mg total) by mouth daily. Patient not taking: Reported on 10/28/2021 07/16/21   [facility],  Jessica M, FNP  °Multiple Vitamin (MULTIVITAMIN WITH MINERALS) TABS tablet Take 1 tablet by mouth in the morning. One-A-Day for Women 50+ °Patient not taking: Reported on 10/28/2021    [provider]  ° ° °Current Medications °Scheduled Meds: ° acetaminophen (TYLENOL) oral liquid 160 mg/5 mL  650 mg Per Tube Q6H  ° artificial tears  1 application Both Eyes Q8H  ° aspirin EC  325 mg Oral Daily  ° Or  ° aspirin  324 mg Per Tube Daily  ° atorvastatin  10 mg Per Tube q AM  ° bisacodyl  10 mg Oral Daily  ° Or  ° bisacodyl  10 mg Rectal Daily  ° buPROPion  75 mg Per Tube BID  ° chlorhexidine gluconate (MEDLINE KIT)  15 mL Mouth Rinse BID  ° Chlorhexidine Gluconate Cloth  6 each Topical Daily  ° docusate  100 mg Per Tube BID  ° enoxaparin (LOVENOX) injection  40 mg Subcutaneous Q24H  ° feeding supplement (PROSource TF)  45 mL Per Tube BID  ° insulin aspart  0-24 Units Subcutaneous Q4H  ° insulin detemir  10 Units Subcutaneous BID  ° mouth rinse  15 mL Mouth Rinse 10 times per day  ° metoCLOPramide (REGLAN) injection  5 mg Intravenous Q6H  ° pantoprazole sodium  40 mg Per Tube Daily  ° polyethylene glycol  17 g Per Tube Daily  ° sennosides  10 mL Per Tube BID  ° sodium chloride flush  10-40 mL Intracatheter Q12H  ° sodium chloride flush  3 mL Intravenous Q12H   ° venlafaxine  75 mg Per Tube BID WC  ° °Continuous Infusions: °  prismasol BGK 4/2.5    °  prismasol BGK 4/2.5    ° sodium chloride 20 mL/hr at 11/07/21 1800  ° sodium chloride    ° sodium chloride 10 mL/hr at 11/06/21 0800  ° sodium chloride    ° amiodarone 30 mg/hr (11/08/21 0630)  ° epinephrine 15 mcg/min (11/08/21 0630)  ° epoprostenol (VELETRI) for inhalation 1.5mg/50mL (30,000 ng/mL) 50 ng/kg/min (11/08/21 0433)  ° feeding supplement (VITAL AF 1.2 CAL) 40 mL/hr at 11/08/21 0619  ° fentaNYL infusion INTRAVENOUS 175 mcg/hr (11/08/21 0630)  ° heparin 10,000 units/ 20 mL infusion syringe    ° lactated ringers    ° midazolam 7 mg/hr (11/08/21 0630)  ° norepinephrine (LEVOPHED) Adult infusion 40 mcg/min (11/08/21 0630)  ° piperacillin-tazobactam (ZOSYN)  IV 12.5 mL/hr at 11/08/21 0630  ° prismasol BGK 4/2.5    ° propofol (DIPRIVAN) infusion Stopped (11/06/21 1800)  ° vancomycin    ° vasopressin 0.05 Units/min (11/08/21 0630)  ° °PRN Meds:.sodium chloride, Place/Maintain arterial line **AND** sodium chloride, fentaNYL, heparin, lip balm, metoprolol tartrate, midazolam, ondansetron (ZOFRAN) IV, rocuronium bromide, sodium chloride, sodium chloride flush, sodium chloride flush ° °CBC °Recent Labs  °Lab 11/06/21 °1518 11/06/21 °1700 11/07/21 °0305 11/07/21 °0524 11/07/21 °1246 11/08/21 °0005 11/08/21 °0422  °WBC 39.2*  --  31.6*  --   --   --  18.5*  °HGB 9.9*   < > 9.7*   < > 10.2* 10.2* 8.8*  °HCT 31.4*   < > 30.6*   < > 30.0* 30.0* 28.7*  °MCV 90.8  --  90.3  --   --   --  92.9  °PLT 257  --  238  --   --   --  159  ° < > = values in this interval not displayed.  ° °Basic Metabolic Panel °Recent Labs  °  Lab 11/01/2021 °2145 11/05/21 °0340 11/05/21 °0345 11/05/21 °2103 11/06/21 °0449 11/06/21 °0502 11/06/21 °1051 11/06/21 °1518 11/06/21 °1700 11/06/21 °1820 11/06/21 °1957 11/07/21 °0305 11/07/21 °0524 11/07/21 °1246 11/08/21 °0005 11/08/21 °0422  °NA 135 136   < > 127*   < > 130*   < > 131*   < > 132* 133* 129* 134*  134* 132* 131*  °K 3.1* 3.9   < > 3.7   < > 3.9   < > 4.0   < > 4.0 4.0 3.9 4.3 3.9 3.8 3.8  °CL 103 105  --  95*  --  99  --  100  --   --   --  97*  --   --   --  97*  °CO2 21* 21*  --  22  --  23  --  21*  --   --   --  20*  --   --   --  18*  °GLUCOSE 239* 125*  --  272*  --  113*  --  100*  --   --   --  89  --   --   --  146*  °BUN 16 16  --  18  --  20  --  23  --   --   --  25*  --   --   --  33*  °CREATININE 1.67* 1.59*  --  1.94*  --  1.86*  --  1.95*  --   --   --  1.93*  --   --   --  2.51*  °CALCIUM 8.5* 8.7*  --  7.8*  --  8.3*  --  8.2*  --   --   --  7.9*  --   --   --  7.8*  °PHOS  --   --   --   --   --   --   --   --   --   --   --  5.0*  --   --   --  5.4*  ° < > = values in this interval not displayed.  ° ° °Physical Exam  Blood pressure (!) 105/39, pulse 81, temperature (!) 100.6 °F (38.1 °C), resp. rate (!) 22, height 5' 2" (1.575 m), weight 108.6 kg, SpO2 97 %. 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

## 2021-11-08 NOTE — Progress Notes (Signed)
Met with patient's family.  They understand gravity of illness and want to keep pushing forward.  Hopefully CRRT turns things around. ? ?Erskine Emery MD PCCM ?

## 2021-11-08 NOTE — Procedures (Signed)
Pt seen on CRRT. Just started. All 4K. Starting net even to see how she does. Will follow along. RS ?

## 2021-11-08 NOTE — Procedures (Signed)
Central Venous Catheter Insertion Procedure Note ? ?[name]  ?[id number]  ?1956-08-29 ? ?Date:11/08/21  ?Time:8:41 AM  ? ?Provider Performing:Verdelle Valtierra E Garima Chronis  ? ?Procedure: Insertion of Non-tunneled Central Venous Catheter(36556)with US guidance (23557)   ? ?Indication(s) ?Hemodialysis ? ?Consent ?Risks of the procedure as well as the alternatives and risks of each were explained to the patient and/or caregiver.  Consent for the procedure was obtained and is signed in the bedside chart ? ?Anesthesia ?Topical only with 1% lidocaine  ? ?Timeout ?Verified patient identification, verified procedure, site/side was marked, verified correct patient position, special equipment/implants available, medications/allergies/relevant history reviewed, required imaging and test results available. ? ?Sterile Technique ?Maximal sterile technique including full sterile barrier drape, hand hygiene, sterile gown, sterile gloves, mask, hair covering, sterile ultrasound probe cover (if used). ? ?Procedure Description ?Area of catheter insertion was cleaned with chlorhexidine and draped in sterile fashion.   With real-time ultrasound guidance a HD catheter was placed into the right femoral vein.  Guidewire placement within femoral vein verified with [place] x 2 views prior to vessel dilation and placement of catheter. Nonpulsatile blood flow and easy flushing noted in all ports.  The catheter was sutured in place and sterile dressing applied. ? ?Complications/Tolerance ?None; patient tolerated the procedure well. ? Chest x-ray is not ordered for femoral cannulation. ? ?EBL ?Minimal ? ?Specimen(s) ?None  ? ? ?Eliseo Gum MSN, AGACNP-BC ?[facility] ?Amion for pager  ?11/08/2021, 8:42 AM ? ?

## 2021-11-08 NOTE — Progress Notes (Signed)
? ?NAME:  SAMADHI MAHURIN, MRN:  [id number], DOB:  1955/12/31, LOS: 12 ?ADMISSION DATE:  10/30/2021, CONSULTATION DATE:  11/06/21 ?REFERRING MD:  Nils Pyle, CHIEF COMPLAINT:  hypoxia  ? ?History of Present Illness:  ?Mrs. Foos is a 66 year old woman with a history of HFrEF and paroxysmal atrial fibrillation who was transferred to [facility] with GI bleed and pericardial effusion.  She had recently undergone watchman insertion in January 2023.  She underwent EGD which did not explain bleeding.  Colonoscopy demonstrated sessile polyps, which were removed in the ascending colon and areas of ischemic colonic ulcers.  There was moderate sigmoid diverticulosis.  Heart catheterization demonstrated normal coronary arteries with equalization of left and right-sided heart pressures, severely reduced PAPi due to severe tricuspid regurgitation.  Swan-Ganz catheter was left in place to assist with weaning inotropic support.  Echocardiograms have previously demonstrated reduced RV function and dilation. On 2/28 she underwent Tricuspid repair and ring annuplasty. Post-op day 1 she was extubated but developed rapidly worsening respiratory failure overnight on 3/2 requiring re-intubation by anesthesia. CXR post-intubation had bilateral infiltrates. ? 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 ?Significant Hospital Events: ?Including procedures, antibiotic start and stop dates in addition to other pertinent events   ? ?2/20 admitted for pericardial effusion without tamponade ?2/22 L&R heart cath ?2/24 EGD & colonoscopy ?2/28 tricuspid valve repair; Vt arrest coming off bypass, back on bypass to stabilize ?3/1 extubated post op, reintubated overnight for hypoxia ?3/2 PCCM consulted for vent management.  Started on vancomycin and Zosyn for presumed aspiration pneumonia ?3/3 remains in a mixed picture of cardiogenic  and septic shock.  Meeting endorgan perfusion parameters and goal SCV O2 currently with mix of inotropic and vasoactive support desaturating with head down, Lasix administered. ? ?Interim History / Subjective:  ?Desaturates w/ head flat  ?CI good. CVP 16, SVR tending down.  ?Objective   ?Blood pressure (!) 107/50, pulse 82, temperature (!) 100.8 ?F (38.2 ?C), resp. rate (!) 22, height '5\' 2"'$  (1.575 m), weight 108.6 kg, SpO2 98 %. ?PAP: (41-280)/(21-41) 47/26 ?CVP:  [14 mmHg-21 mmHg] 19 mmHg ?CO:  [4.4 L/min-5.3 L/min] 4.7 L/min ?CI:  [2.2 L/min/m2-2.7 L/min/m2] 2.4 L/min/m2  ?Vent Mode: PRVC ?FiO2 (%):  [100 %] 100 % ?Set Rate:  [22 bmp] 22 bmp ?Vt Set:  [380 mL-400 mL] 400 mL ?PEEP:  [16 cmH20-20 cmH20] 20 cmH20 ?Plateau Pressure:  [23 cmH20-39 cmH20] 39 cmH20  ? ?Intake/Output Summary (Last 24 hours) at 11/08/2021 0844 ?Last data filed at 11/08/2021 0630 ?Gross per 24 hour  ?Intake 3430.57 ml  ?Output 290 ml  ?Net 3140.57 ml  ? ? ?Filed Weights  ? 11/06/21 0500 11/07/21 0427 11/08/21 0500  ?Weight: 99.5 kg 100.2 kg 108.6 kg  ? ? ?Examination: ?Ill appearing unresponsive paralyzed woman ?Severe anasarca ?Abdomen distended, absent bowel sounds ?Lungs diminished, passive on vent plat 37, PEEP 20, DP 17 ?Ext lukewarm, no mottling yet ? ?Pct up ?ABG 7.22, no room for increased ventilation ?Renal function a bit worse ?WBC down ? ?Resolved Hospital Problem list   ? ? ?Assessment & Plan:  ?Acute hypoxemic respiratory failure some combination aspiration, ARDS, CAP, pulmonary edema, worsening ?LBBB +/- adriamycin-induced NICM ?Progressive right heart failure complicated by severe tricuspid regurgitation s/p  ?TVR 2/28.  Procedure complicated by VT arrest coming off pump, brief. ?Fluid overloaded state of heart. ?Renal failure with oliguria- septic ATN and cardiorenal ?  Postoperative mixed cardiogenic and septic shock- worsening ?Pericardial effusion moderate without tamponade. ?Chronic AF, hx NSVT, AICD in place ?Ischemic  colitis- with ABLA, was resolved, I'm a bit worried about this being a recurrent issue given rising pressor needs, absent BS ?HLD, GERD, OSA ontolerant to BIPAP ?Hx breast ca 1998 with exposure to adriamycin ?DM with hyperglycemia ? ?- Start salvage CRRT, methylene blue push prior ?- Continue vanc/zosyn ?- Titrate pressors to MAP 65; add stress steroids ?- Check lactate, KUB ?- Lung protective ventilation on ARDSnet protocol, allow permission hypercapnea, limit driving pressures, continue veletri and NMB with sedation titrated to BIS ?- Going to bring family in for [place] talk, patient has reached maximal support, do not think chest compressions or shock will change outcome here ? ?Best Practice (right click and "Reselect all SmartList Selections" daily)  ? ?Diet/type: tubefeeds ?DVT prophylaxis: SCD ?GI prophylaxis: PPI ?Lines: Central line and Arterial Line ?Foley:  Yes, and it is still needed ?Code Status:  full code ?Last date of multidisciplinary goals of care discussion [name] discuss today] ? ? ?Patient critically ill due to multiorgan failure ?Interventions to address this today vent/CRRT/pressor titration, family [place] discussions ?Risk of deterioration without these interventions is high ? ?I personally spent 82 minutes providing critical care not including any separately billable procedures ? ?Erskine Emery MD ?[facility] Pulmonary Critical Care ? ?Prefer epic messenger for cross cover needs ?If after hours, please call E-link ? ? ?

## 2021-11-09 ENCOUNTER — Inpatient Hospital Stay (HOSPITAL_COMMUNITY): Payer: Medicare PPO

## 2021-11-09 DIAGNOSIS — R570 Cardiogenic shock: Secondary | ICD-10-CM | POA: Diagnosis not present

## 2021-11-09 DIAGNOSIS — N179 Acute kidney failure, unspecified: Secondary | ICD-10-CM | POA: Diagnosis not present

## 2021-11-09 DIAGNOSIS — I3139 Other pericardial effusion (noninflammatory): Secondary | ICD-10-CM | POA: Diagnosis not present

## 2021-11-09 LAB — COMPREHENSIVE METABOLIC PANEL
ALT: 8 U/L (ref 0–44)
AST: 61 U/L — ABNORMAL HIGH (ref 15–41)
Albumin: 1.7 g/dL — ABNORMAL LOW (ref 3.5–5.0)
Alkaline Phosphatase: 236 U/L — ABNORMAL HIGH (ref 38–126)
Anion gap: 11 (ref 5–15)
BUN: 21 mg/dL (ref 8–23)
CO2: 23 mmol/L (ref 22–32)
Calcium: 7.5 mg/dL — ABNORMAL LOW (ref 8.9–10.3)
Chloride: 101 mmol/L (ref 98–111)
Creatinine, Ser: 1.51 mg/dL — ABNORMAL HIGH (ref 0.44–1.00)
GFR, Estimated: 38 mL/min — ABNORMAL LOW (ref >60)
Glucose, Bld: 201 mg/dL — ABNORMAL HIGH (ref 70–99)
Potassium: 3.7 mmol/L (ref 3.5–5.1)
Sodium: 135 mmol/L (ref 135–145)
Total Bilirubin: 1 mg/dL (ref 0.3–1.2)
Total Protein: 5 g/dL — ABNORMAL LOW (ref 6.5–8.1)

## 2021-11-09 LAB — POCT I-STAT 7, (LYTES, BLD GAS, ICA,H+H)
Acid-base deficit: 4 mmol/L — ABNORMAL HIGH (ref 0.0–2.0)
Acid-base deficit: 2 mmol/L (ref 0.0–2.0)
Acid-base deficit: 2 mmol/L (ref 0.0–2.0)
Bicarbonate: 22.5 mmol/L (ref 20.0–28.0)
Bicarbonate: 25.1 mmol/L (ref 20.0–28.0)
Bicarbonate: 23.9 mmol/L (ref 20.0–28.0)
Calcium, Ion: 1.08 mmol/L — ABNORMAL LOW (ref 1.15–1.40)
Calcium, Ion: 1.09 mmol/L — ABNORMAL LOW (ref 1.15–1.40)
Calcium, Ion: 1.12 mmol/L — ABNORMAL LOW (ref 1.15–1.40)
HCT: 28 % — ABNORMAL LOW (ref 36.0–46.0)
HCT: 30 % — ABNORMAL LOW (ref 36.0–46.0)
HCT: 31 % — ABNORMAL LOW (ref 36.0–46.0)
Hemoglobin: 9.5 g/dL — ABNORMAL LOW (ref 12.0–15.0)
Hemoglobin: 10.2 g/dL — ABNORMAL LOW (ref 12.0–15.0)
Hemoglobin: 10.5 g/dL — ABNORMAL LOW (ref 12.0–15.0)
O2 Saturation: 99 %
O2 Saturation: 99 %
O2 Saturation: 96 %
Patient temperature: 36.64
Patient temperature: 36.2
Patient temperature: 35.9
Potassium: 3.3 mmol/L — ABNORMAL LOW (ref 3.5–5.1)
Potassium: 3.5 mmol/L (ref 3.5–5.1)
Potassium: 3.4 mmol/L — ABNORMAL LOW (ref 3.5–5.1)
Sodium: 137 mmol/L (ref 135–145)
Sodium: 137 mmol/L (ref 135–145)
Sodium: 138 mmol/L (ref 135–145)
TCO2: 24 mmol/L (ref 22–32)
TCO2: 27 mmol/L (ref 22–32)
TCO2: 25 mmol/L (ref 22–32)
pCO2 arterial: 46.2 mmHg (ref 32–48)
pCO2 arterial: 48.5 mmHg — ABNORMAL HIGH (ref 32–48)
pCO2 arterial: 40.9 mmHg (ref 32–48)
pH, Arterial: 7.294 — ABNORMAL LOW (ref 7.35–7.45)
pH, Arterial: 7.318 — ABNORMAL LOW (ref 7.35–7.45)
pH, Arterial: 7.369 (ref 7.35–7.45)
pO2, Arterial: 183 mmHg — ABNORMAL HIGH (ref 83–108)
pO2, Arterial: 147 mmHg — ABNORMAL HIGH (ref 83–108)
pO2, Arterial: 84 mmHg (ref 83–108)

## 2021-11-09 LAB — COOXEMETRY PANEL
Carboxyhemoglobin: 1 % (ref 0.5–1.5)
Carboxyhemoglobin: 0.5 % (ref 0.5–1.5)
Carboxyhemoglobin: 1.6 % — ABNORMAL HIGH (ref 0.5–1.5)
Carboxyhemoglobin: 1.3 % (ref 0.5–1.5)
Methemoglobin: <0.7 % (ref 0.0–1.5)
Methemoglobin: <0.7 % (ref 0.0–1.5)
Methemoglobin: <0.7 % (ref 0.0–1.5)
Methemoglobin: <0.7 % (ref 0.0–1.5)
O2 Saturation: 95.1 %
O2 Saturation: 74.3 %
O2 Saturation: 91.7 %
O2 Saturation: 55.7 %
Total hemoglobin: 8.6 g/dL — ABNORMAL LOW (ref 12.0–16.0)
Total hemoglobin: 8.9 g/dL — ABNORMAL LOW (ref 12.0–16.0)
Total hemoglobin: <6.9 g/dL — CL (ref 12.0–16.0)
Total hemoglobin: 9.8 g/dL — ABNORMAL LOW (ref 12.0–16.0)

## 2021-11-09 LAB — GLUCOSE, CAPILLARY
Glucose-Capillary: 153 mg/dL — ABNORMAL HIGH (ref 70–99)
Glucose-Capillary: 151 mg/dL — ABNORMAL HIGH (ref 70–99)
Glucose-Capillary: 212 mg/dL — ABNORMAL HIGH (ref 70–99)
Glucose-Capillary: 161 mg/dL — ABNORMAL HIGH (ref 70–99)
Glucose-Capillary: 175 mg/dL — ABNORMAL HIGH (ref 70–99)

## 2021-11-09 LAB — CULTURE, RESPIRATORY W GRAM STAIN

## 2021-11-09 LAB — MAGNESIUM: Magnesium: 2.4 mg/dL (ref 1.7–2.4)

## 2021-11-09 LAB — RENAL FUNCTION PANEL
Albumin: 1.7 g/dL — ABNORMAL LOW (ref 3.5–5.0)
Anion gap: 10 (ref 5–15)
BUN: 17 mg/dL (ref 8–23)
CO2: 23 mmol/L (ref 22–32)
Calcium: 7.4 mg/dL — ABNORMAL LOW (ref 8.9–10.3)
Chloride: 102 mmol/L (ref 98–111)
Creatinine, Ser: 1.09 mg/dL — ABNORMAL HIGH (ref 0.44–1.00)
GFR, Estimated: 56 mL/min — ABNORMAL LOW (ref >60)
Glucose, Bld: 171 mg/dL — ABNORMAL HIGH (ref 70–99)
Phosphorus: <1 mg/dL — CL (ref 2.5–4.6)
Potassium: 3.4 mmol/L — ABNORMAL LOW (ref 3.5–5.1)
Sodium: 135 mmol/L (ref 135–145)

## 2021-11-09 LAB — CBC
HCT: 29.1 % — ABNORMAL LOW (ref 36.0–46.0)
Hemoglobin: 9 g/dL — ABNORMAL LOW (ref 12.0–15.0)
MCH: 28 pg (ref 26.0–34.0)
MCHC: 30.9 g/dL (ref 30.0–36.0)
MCV: 90.7 fL (ref 80.0–100.0)
Platelets: 140 10*3/uL — ABNORMAL LOW (ref 150–400)
RBC: 3.21 MIL/uL — ABNORMAL LOW (ref 3.87–5.11)
RDW: 19.4 % — ABNORMAL HIGH (ref 11.5–15.5)
WBC: 19.9 10*3/uL — ABNORMAL HIGH (ref 4.0–10.5)
nRBC: 2.1 % — ABNORMAL HIGH (ref 0.0–0.2)

## 2021-11-09 LAB — APTT: aPTT: 66 seconds — ABNORMAL HIGH (ref 24–36)

## 2021-11-09 LAB — HEPARIN LEVEL (UNFRACTIONATED): Heparin Unfractionated: 0.17 IU/mL — ABNORMAL LOW (ref 0.30–0.70)

## 2021-11-09 LAB — PHOSPHORUS: Phosphorus: 2.4 mg/dL — ABNORMAL LOW (ref 2.5–4.6)

## 2021-11-09 MED ORDER — INSULIN DETEMIR 100 UNIT/ML ~~LOC~~ SOLN
8.0000 [IU] | Freq: Two times a day (BID) | SUBCUTANEOUS | Status: DC
  Administered 2021-11-09 – 2021-11-13 (×10): 8 [IU] via SUBCUTANEOUS
  Filled 2021-11-09 (×12): qty 0.08

## 2021-11-09 MED ORDER — POTASSIUM PHOSPHATES 15 MMOLE/5ML IV SOLN
30.0000 mmol | Freq: Once | INTRAVENOUS | Status: AC
  Administered 2021-11-09: 30 mmol via INTRAVENOUS
  Filled 2021-11-09: qty 10

## 2021-11-09 NOTE — Procedures (Signed)
Admit: 11/03/2021 ?LOS: 13 ? 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 ?Current CRRT Prescription: ?Start Date: 11/08/21 ?Catheter: R Femoral Temp placed 11/08/21 CCM ?BFR: 200 ?Pre Blood Pump: 800 4K ?DFR: 1500 4K ?Replacement Rate: 400 4K ?Goal UF: 50 to 149m/h neg neg  ?Anticoagulation: Fixed dose heparin in CRRT circuit ?Clotting: not since starting ? ?S: ?Tolerating some UF ?K 3.7, P 2.4 ?Husband at bedside updated ?On NE/Epi/VP ? ?O: ?03/04 0701 - 03/05 0700 ?In: 3891.1 [I.V.:2773.1; NG/GT:690; IV Piggyback:428.1] ?Out: 56659[Urine:65; Emesis/NG output:60; Chest Tube:50] ? ?Filed Weights  ? 11/07/21 0427 11/08/21 0500 11/09/21 0500  ?Weight: 100.2 kg 108.6 kg 107.5 kg  ? ? ?Recent Labs  ?Lab 11/08/21 ?0422 11/08/21 ?0740 11/08/21 ?1533 11/09/21 ?0408 11/09/21 ?09357 ?NA 131*   < > 134* 135 137  ?K 3.8   < > 3.6 3.7 3.3*  ?CL 97*  --  100 101  --   ?CO2 18*  --  21* 23  --   ?GLUCOSE 146*  --  212* 201*  --   ?BUN 33*  --  29* 21  --   ?CREATININE 2.51*  --  2.05* 1.51*  --   ?CALCIUM 7.8*  --  7.5* 7.5*  --   ?PHOS 5.4*  --  3.9 2.4*  --   ? < > = values in this interval not displayed.  ? ?Recent Labs  ?Lab 11/07/21 ?0305 11/07/21 ?0017703/04/23 ?0422 11/08/21 ?0740 11/09/21 ?0408 11/09/21 ?09390 ?WBC 31.6*  --  18.5*  --  19.9*  --   ?HGB 9.7*   < > 8.8* 9.9* 9.0* 9.5*  ?HCT 30.6*   < > 28.7* 29.0* 29.1* 28.0*  ?MCV 90.3  --  92.9  --  90.7  --   ?PLT 238  --  159  --  140*  --   ? < > = values in this interval not displayed.  ? ? ?Scheduled Meds: ? acetaminophen (TYLENOL) oral liquid 160 mg/5 mL  650 mg Per Tube Q6H  ? artificial tears  1 application Both Eyes QZ0S ? aspirin EC  325 mg Oral Daily  ? Or  ? aspirin  324 mg Per Tube Daily  ? atorvastatin  10 mg Per Tube q AM  ? bisacodyl  10 mg Oral Daily  ? Or  ? bisacodyl  10 mg Rectal Daily  ? buPROPion  75 mg Per Tube BID  ? chlorhexidine gluconate (MEDLINE KIT)  15 mL Mouth Rinse BID  ? Chlorhexidine  Gluconate Cloth  6 each Topical Daily  ? docusate  100 mg Per Tube BID  ? enoxaparin (LOVENOX) injection  30 mg Subcutaneous Q24H  ? feeding supplement (PROSource TF)  45 mL Per Tube BID  ? hydrocortisone sod succinate (SOLU-CORTEF) inj  100 mg Intravenous Q12H  ? insulin aspart  0-24 Units Subcutaneous Q4H  ? insulin detemir  8 Units Subcutaneous BID  ? mouth rinse  15 mL Mouth Rinse 10 times per day  ? metoCLOPramide (REGLAN) injection  5 mg Intravenous Q6H  ? neostigmine  0.25 mg Subcutaneous Q6H  ? pantoprazole sodium  40 mg Per Tube Daily  ? polyethylene glycol  17 g Per Tube Daily  ? sennosides  10 mL Per Tube BID  ? sodium chloride flush  10-40 mL Intracatheter Q12H  ? sodium chloride flush  3 mL Intravenous Q12H  ? venlafaxine  75 mg Per Tube BID  WC  ? ?Continuous Infusions: ?  prismasol BGK 4/2.5 800 mL/hr at 11/09/21 0009  ?  prismasol BGK 4/2.5 400 mL/hr at 11/08/21 0957  ? sodium chloride 20 mL/hr at 11/07/21 1800  ? sodium chloride    ? sodium chloride 10 mL/hr at 11/06/21 0800  ? sodium chloride    ? amiodarone 30 mg/hr (11/09/21 0900)  ? epinephrine 13 mcg/min (11/09/21 0900)  ? epoprostenol (VELETRI) for inhalation 1.10m/50mL (30,000 ng/mL) 50 ng/kg/min (11/09/21 0033)  ? feeding supplement (VITAL AF 1.2 CAL) 1,000 mL (11/08/21 1709)  ? fentaNYL infusion INTRAVENOUS 200 mcg/hr (11/09/21 0900)  ? heparin 10,000 units/ 20 mL infusion syringe 500 Units/hr (11/09/21 0800)  ? lactated ringers 5 mL/hr at 11/08/21 1603  ? midazolam 5 mg/hr (11/09/21 0900)  ? norepinephrine (LEVOPHED) Adult infusion 18 mcg/min (11/09/21 0900)  ? piperacillin-tazobactam Stopped (11/09/21 0445)  ? prismasol BGK 4/2.5 1,500 mL/hr at 11/09/21 09030 ? vancomycin Stopped (11/08/21 1812)  ? vasopressin 0.03 Units/min (11/09/21 0900)  ? ?PRN Meds:.sodium chloride, Place/Maintain arterial line **AND** sodium chloride, fentaNYL, heparin, lip balm, metoprolol tartrate, midazolam, ondansetron (ZOFRAN) IV, rocuronium bromide, sodium  chloride, sodium chloride flush, sodium chloride flush ? ?ABG ?   ?Component Value Date/Time  ? PHART 7.294 (L) 11/09/2021 0551  ? PCO2ART 46.2 11/09/2021 0551  ? PO2ART 183 (H) 11/09/2021 0551  ? HCO3 22.5 11/09/2021 0551  ? TCO2 24 11/09/2021 0551  ? ACIDBASEDEF 4.0 (H) 11/09/2021 0551  ? O2SAT 99 11/09/2021 0551  ? ? 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 ?Cont CRRT on current settings.   ? ?RPearson Grippe MD ?CSan Joaquin?pgr [phone] ?

## 2021-11-09 NOTE — Progress Notes (Signed)
eLink Physician-Brief Progress Note ?Patient Name: [name] ?DOB: 1955-09-20 ?MRN: [id number] ? ? ?Date of Service ? 11/09/2021  ?HPI/Events of Note ? ABG reviewed. On 400/20/22/100%. ?  ?eICU Interventions ? Continue care, no changes made.   ? ? ? ?  ? ?Elmer Sow ?11/09/2021, 6:06 AM ?

## 2021-11-09 NOTE — Progress Notes (Addendum)
Advanced Heart Failure Rounding Note  PCP-Cardiologist: None   Subjective:   02/20: Transferred to [facility] for GI bleed + pericardial effusion 02/22: R/LHC with normal cors, equalized R and L sided pressures with no evidence of LV-RV interaction, RA 17, PA 33/21 (25), Fick CO/CI 4.2/2.2, severely reduced PAPi (in setting of severe TR), PA sat 47%, 48% 2/24: S/P Two polyps in the distal ascending colon, removed with a cold snare. Resected and retrieved. Right colonic ischemic ulcers (healing, no active bleeding, biopsied)-likely etiology of LGI. Moderate sigmoid diverticulosis. 2/25: Diuresed with IV lasix.  2/26: Switched to torsemide 60 /60 .  2/28: S/P TVR . Off pump had VT/VT when PA catheter placed. Had cardiac massage. Cardioverted x2. Unsuccessful. Placed back on pump with restoration of SR. Started on IV amiodarone.  3/1: Extubated 3/2: Reintubated for acute hypoxic respiratory failure. Stat CXR ? Possible b/l PNA. Concern for developing septic shock, WBC 39K. PCT 2.49. Increased pressor requirements w/ NE and VP. Milrinone discontinued w/ low SVR. Several rounds of bicarb for persistent metabolic acidosis. Started on vanc + zosyn. Transfused 1u RBCs 3/4: CVVH initiated  This morning, remains intubated, FiO2 0.9. CXR with bilateral airspace opacities.   CVVH ongoing with net UF 50-100 cc/hr.  I/O net negative 1402.   On Epi 13, NE 18, VP 0.03. Remains off milrinone. MAP stable.    Remains on Vanc and Zosyn. WBCs 39>>31>>18.5>>20K. Cultures NGTD.  Swan #s CVP 20 PAP 46/26 CI 2.4 PAPi 1 Co-ox 95% (?drawn from PA catheter?) SVR 931  Intubated and sedated.   Objective:   Weight Range: 107.5 kg Body mass index is 43.35 kg/m.   Vital Signs:   Temp:  [95.4 F (35.2 C)-100.9 F (38.3 C)] 97.5 F (36.4 C) (03/05 0900) Pulse Rate:  [70-89] 70 (03/05 0900) Resp:  [21-38] 24 (03/05 0900) BP: (91-118)/(26-55) 103/52 (03/05 0900) SpO2:  [79 %-100 %] 100 % (03/05  0900) Arterial Line BP: (93-141)/(45-100) 116/52 (03/05 0900) FiO2 (%):  [90 %-100 %] 90 % (03/05 0900) Weight:  [107.5 kg] 107.5 kg (03/05 0500) Last BM Date : 11/09/21  Weight change: Filed Weights   11/07/21 0427 11/08/21 0500 11/09/21 0500  Weight: 100.2 kg 108.6 kg 107.5 kg    Intake/Output:   Intake/Output Summary (Last 24 hours) at 11/09/2021 0944 Last data filed at 11/09/2021 0900 Gross per 24 hour  Intake 3761.78 ml  Output 5802 ml  Net -2040.22 ml      Physical Exam   CVP 20 General: NAD Neck: JVP 16+ cm, no thyromegaly or thyroid nodule.  Lungs: Decreased bilaterally.  CV: Nondisplaced PMI.  Heart regular S1/S2, no S3/S4, no murmur. 1+ ankle edema.   Abdomen: Soft, nontender, no hepatosplenomegaly, no distention.  Skin: Intact without lesions or rashes.  Neurologic: Sedated on vent.  Extremities: No clubbing or cyanosis.  HEENT: Normal.   Telemetry   AV paced 80s. No further VT /VF. Personally reviewed.    Labs    CBC Recent Labs    11/08/21 0422 11/08/21 0740 11/09/21 0408 11/09/21 0551  WBC 18.5*  --  19.9*  --   HGB 8.8*   < > 9.0* 9.5*  HCT 28.7*   < > 29.1* 28.0*  MCV 92.9  --  90.7  --   PLT 159  --  140*  --    < > = values in this interval not displayed.   Basic Metabolic Panel Recent Labs    11/08/21 0422 11/08/21 0740 11/08/21  1533 11/09/21 0408 11/09/21 0551  NA 131*   < > 134* 135 137  K 3.8   < > 3.6 3.7 3.3*  CL 97*  --  100 101  --   CO2 18*  --  21* 23  --   GLUCOSE 146*  --  212* 201*  --   BUN 33*  --  29* 21  --   CREATININE 2.51*  --  2.05* 1.51*  --   CALCIUM 7.8*  --  7.5* 7.5*  --   MG 2.2  --   --  2.4  --   PHOS 5.4*  --  3.9 2.4*  --    < > = values in this interval not displayed.   Liver Function Tests Recent Labs    11/08/21 0422 11/08/21 1533 11/09/21 0408  AST 87*  --  61*  ALT 8  --  8  ALKPHOS 192*  --  236*  BILITOT 1.2  --  1.0  PROT 4.8*  --  5.0*  ALBUMIN 1.8* 1.6* 1.7*   No results for  input(s): LIPASE, AMYLASE in the last 72 hours. Cardiac Enzymes No results for input(s): CKTOTAL, CKMB, CKMBINDEX, TROPONINI in the last 72 hours.  BNP: BNP (last 3 results) Recent Labs    02/24/21 1357 07/16/21 1145 10/30/2021 2138  BNP 326.4* 523.7* 477.9*    ProBNP (last 3 results) No results for input(s): PROBNP in the last 8760 hours.   D-Dimer No results for input(s): DDIMER in the last 72 hours. Hemoglobin A1C Recent Labs    11/07/21 0305  HGBA1C 5.5   Fasting Lipid Panel Recent Labs    11/07/21 0305  TRIG 130    Thyroid Function Tests No results for input(s): TSH, T4TOTAL, T3FREE, THYROIDAB in the last 72 hours.  Invalid input(s): FREET3   Other results:   Imaging    DG CHEST PORT 1 VIEW  Result Date: 11/08/2021 CLINICAL DATA:  Endotracheal tube. EXAM: PORTABLE CHEST 1 VIEW COMPARISON:  Chest x-ray 11/08/2021. FINDINGS: Endotracheal tube tip is 1.3 above the carina. Enteric tube extends below the diaphragm. Swan-Ganz catheter tip projects over the right main pulmonary artery, unchanged. Bilateral pleural drains are stable in position. Right-sided pacemaker again seen. Sternotomy wires are present. The heart is enlarged, unchanged. Bilateral mid and lower lung airspace disease has slightly decreased on the right. There is no pleural effusion or pneumothorax. No acute fractures are seen. IMPRESSION: 1. Decreasing bilateral airspace. 2. Endotracheal tube tip 1.3 cm above the carina. Other lines and tubes are stable in position. Electronically Signed   By: Ronney Asters M.D.   On: 11/08/2021 21:53     Medications:     Scheduled Medications:  acetaminophen (TYLENOL) oral liquid 160 mg/5 mL  650 mg Per Tube Q6H   artificial tears  1 application Both Eyes W2B   aspirin EC  325 mg Oral Daily   Or   aspirin  324 mg Per Tube Daily   atorvastatin  10 mg Per Tube q AM   bisacodyl  10 mg Oral Daily   Or   bisacodyl  10 mg Rectal Daily   buPROPion  75 mg Per Tube  BID   chlorhexidine gluconate (MEDLINE KIT)  15 mL Mouth Rinse BID   Chlorhexidine Gluconate Cloth  6 each Topical Daily   docusate  100 mg Per Tube BID   enoxaparin (LOVENOX) injection  30 mg Subcutaneous Q24H   feeding supplement (PROSource TF)  45 mL Per  Tube BID   hydrocortisone sod succinate (SOLU-CORTEF) inj  100 mg Intravenous Q12H   insulin aspart  0-24 Units Subcutaneous Q4H   insulin detemir  8 Units Subcutaneous BID   mouth rinse  15 mL Mouth Rinse 10 times per day   metoCLOPramide (REGLAN) injection  5 mg Intravenous Q6H   neostigmine  0.25 mg Subcutaneous Q6H   pantoprazole sodium  40 mg Per Tube Daily   polyethylene glycol  17 g Per Tube Daily   sennosides  10 mL Per Tube BID   sodium chloride flush  10-40 mL Intracatheter Q12H   sodium chloride flush  3 mL Intravenous Q12H   venlafaxine  75 mg Per Tube BID WC    Infusions:   prismasol BGK 4/2.5 800 mL/hr at 11/09/21 0009    prismasol BGK 4/2.5 400 mL/hr at 11/08/21 0957   sodium chloride 20 mL/hr at 11/07/21 1800   sodium chloride     sodium chloride 10 mL/hr at 11/06/21 0800   sodium chloride     amiodarone 30 mg/hr (11/09/21 0900)   epinephrine 13 mcg/min (11/09/21 0900)   epoprostenol (VELETRI) for inhalation 1.50m/50mL (30,000 ng/mL) 50 ng/kg/min (11/09/21 0033)   feeding supplement (VITAL AF 1.2 CAL) 1,000 mL (11/08/21 1709)   fentaNYL infusion INTRAVENOUS 200 mcg/hr (11/09/21 0900)   heparin 10,000 units/ 20 mL infusion syringe 500 Units/hr (11/09/21 0800)   lactated ringers 5 mL/hr at 11/08/21 1603   midazolam 5 mg/hr (11/09/21 0900)   norepinephrine (LEVOPHED) Adult infusion 18 mcg/min (11/09/21 0900)   piperacillin-tazobactam Stopped (11/09/21 0445)   prismasol BGK 4/2.5 1,500 mL/hr at 11/09/21 0624   vancomycin Stopped (11/08/21 1812)   vasopressin 0.03 Units/min (11/09/21 0900)     PRN Medications: sodium chloride, Place/Maintain arterial line **AND** sodium chloride, fentaNYL, heparin, lip balm,  metoprolol tartrate, midazolam, ondansetron (ZOFRAN) IV, rocuronium bromide, sodium chloride, sodium chloride flush, sodium chloride flush     Patient Profile    66y.o. female with h/o PAF with recent failed ablation S/p Watchman implant 08/21/21,  breast CA in 1998 s/p left breast surgery and s/p Adriamycin x 4 cycles, non-ischemic cardiomyopathy/chronic systolic CHF, LBBB, s/p BiV Medtronic ICD, HTN, OSA, and CKD Stage IIIb. Transferred from OSH for acute GIB and pericardial effusion. Initial Echo here showed EF 35-40% RV severely enlarged w/ low normal systolic fx. Moderate posterior effusion, no tamponade. Severe TR noted. R/LCH showed normal cors, LVEF 40%, Equalized right and left-sided pressure with no evidence of LV-RV interaction and evidence of severe RV failure w/ severely reduced PAPi, 0.6, in setting of severe TR. Went to OR 2/28 for TVR. Off pump had VT/VF when PA catheter placed. Had cardiac massage. Cardioverted x2. Unsuccessful. Placed back on pump with restoration of SR. Started on IV amiodarone.   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 Acute on Chronic Systolic CHF w/ Post Cardiotomy Shock  - felt to have LBBB CM vs chemo induced (got adriamycin). 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 No effusion -Echo 02/21: EF 35-40%, RV severely enlarged with okay function, moderate MR, severe TR with flail posterior leaflet -RHC 02/22: RA 17, PA 33/21 (25), PCWP 18, PAPi 0.6 (in setting of severe TR), PA sat  47%, 48% -S/p TVR 2/28. Intraop TEE EF 40-45%, RV mildly reduced.  VT arrest when coming off pump  - Milrinone discontinued 3/2 w/ low SVR - Mixed septic/cardiogenic shock.  CI 2.4 today with SVR still  relatively low at 937.  PAPI still low at 1.  Today on Epi 13, NE 18, VP 0.03, continue to gradually lower pressor doses (now coming down).  - On Broad spectrum vanc/Zosyn. Pan-cultures NGTD.  - Continue to follow hemodynamics off swan - Still markedly volume overloaded, continue CVVH with net UF aiming 50-100 cc/hr.   - Co-ox around 95% twice, think arterial line draw but nurse thinks off PA catheter.  Nurse will draw co-ox from distal and proximal ports of PA catheter now.   3. Acute Hypoxic Respiratory Failure - Extubated 3/1 - Reintubated 3/2. CXR ? B/l PNA  - PCT 2.49>>3.66>>4.15 - WBC 24>>36>>34>39>31>>18.5>>20K. Covering w/ empiric abx, Vanc + zosyn  - Cultures NGTD - CXR with bilateral diffuse infiltrates, suspect PNA + volume overload/pulmonary edema. FiO2 0.9, needs fluid off.  - Fluid removal via CVVH.   4. Pericardial effusion: -Doubt from Watchman procedure in 12/22 -Effusion appears moderate in size on echo 02/21.  -Upper [place] 02/22 with equalization of right and left-sided pressures with no evidence of LV-RV interaction -There was no evidence of tamponade. Suspect main issue is severe TR with markedly elevated RA pressures and inability to drain pericardium.  5. PAF/tachycardia - Unable to complete ablation 10/22 d/t pericardial effusion - S/p Watchman implant 08/21/21 - continue amio gtt 30 mg/hr while on inotropes/ pressors  - Has not been on heparin gtt (prophylactic enoxaparin post-op).   6. AKI on CKD IIIb/IV/ ATN - Creatinine up to 2.5, oliguric nearing anuric. Suspect ATN.  - In setting of GI bleed and hypotension +/- cardiorenal  - Marked volume overload with no response to IV diuretics => now on CVVH, aim today for net UF 50-100 cc/hr.    6. GI bleeding - BRBPR + melena 4-5 days prior to admission - Per her report CT at OSH with diverticulosis but no diverticulitis - 1 u PRBCs this admit -EGD/Colonoscopy- R colonic ischemic ulcers - suspect in the setting of low  output hf., 2 polyps removed, diverticulosis. Polyps no malignancy   - On PPI  - Follow H/Hg, hgb 9, transfuse < 8.   7. OSA - Unable to tolerate CPAP  8. Hypokalemia/ hypomagnesia - K /Mag stable.   9. Ileus - suspect related to mesenteric ischemia in setting of low arterial pressure and high venous outflow pressures from RV failure - No h/o ischemic colitis symptoms - Has CT abdomen from OSH (non contrast) no documentation of ab arterial calcification - Continue Reglan, neostigmine.   10. VT/VF -Intraoperative. Placed on amio drip.  -stable, no recurrence  -continue amio gtt -keep K >4.0 and Mg >2.0  Still critically ill but mildly improved on CVVH.  Gradually coming down on pressors.   CRITICAL CARE Performed by: Loralie Champagne  Total critical care time: 40 minutes  Critical care time was exclusive of separately billable procedures and treating other patients.  Critical care was necessary to treat or prevent imminent or life-threatening deterioration.  Critical care was time spent personally by me on the following activities: development of treatment plan with patient and/or surrogate as well as nursing, discussions with consultants, evaluation of patient's response to treatment, examination of patient, obtaining history from patient or surrogate, ordering and performing treatments and interventions, ordering and review of laboratory studies,  ordering and review of radiographic studies, pulse oximetry and re-evaluation of patient's condition.  Length of Stay: 22  Loralie Champagne, MD  11/09/2021, 9:44 AM  Advanced Heart Failure Team Pager [phone] (M-F; 7a - 5p)  Please contact [facility] for night-coverage after hours (5p -7a ) and weekends on [email or web address]

## 2021-11-09 NOTE — Hospital Course (Addendum)
History of Present Illness: ? ?The patient is a 66 year old female with nonischemic cardiomyopathy, followed by the heart failure service.  She was admitted to the hospital for lower GI bleeding.  She had been on Plavix for recent Watchman device placement.  The GI  ?bleeding made her heart failure worse.  The GI bleeding was investigated and treated and transfusion was needed.  It was thought to be related to her ischemic colitis noted on colonoscopy.  She ultimately stopped GI bleeding and symptoms of ischemic  ?colitis resolved.  Her Plavix was held.  She underwent evaluation of her tricuspid regurgitation by the heart failure team including a TEE and cardiac catheterization.  The cardiac catheterization showed no significant coronary disease with poor RV power ? index and elevated CVP.  The transesophageal echo showed flail leaflet of the posterior leaflet of the tricuspid valve.  The patient was recommended for high risk tricuspid valve repair in order to improve her RV function and improve her symptoms of  ?heart failure. ?  ?I reviewed all of the studies and discussed the procedure of tricuspid valve repair/replacement with the patient and her husband.  She understood the details of surgery to include general anesthesia, sternotomy and cardiopulmonary bypass.  She understood ? we would stop her heart to attempt valve repair, but replacement may be necessary.  She understood the risks of progressive heart failure, organ failure, stroke, infection, bleeding, blood transfusion, and death.  She agreed to proceed with surgery  ?under what I felt was an informed consent. ? ?Hospital Course: ?[name] was taken to the operating room on 10/20/2021 where tricuspid valve replacement was carried out.  Please see the operative note for details.  Transesophageal echo done at the conclusion of the operation showed minimal tricuspid regurgitation.  After separation from cardiopulmonary bypass, the patient had  ventricular tachycardia and ventricular fibrillation.  Internal cardiac massage was carried out while preparations were made to immediately return the patient to full cardiopulmonary bypass.  Amiodarone was initiated IV.  After she was reperfused and rested on the cardiopulmonary bypass pump, she was again weaned and separated from bypass without difficulty.  She was transferred to the ICU on milrinone, norepinephrine, epinephrine, and amiodarone.  Perioperative coagulopathy was treated with transfusions of packed red blood cells, fresh frozen plasma, and platelets.  Overnight after surgery, her hemodynamics are stable.  She was weaned from the ventilator and extubated but required reintubation and mechanical ventilation by 6 AM on the following morning due to progressive hypoxia.  Chest x-ray was consistent with aspiration pneumonia and moderate edema.  She was followed closely by the critical care team as well as advanced heart failure.  It became apparent that she had significant volume overload and also uric renal insufficiency.  Nephrology was consulted patient being evaluated by Dr.  Pearson Grippe.  CRRT was initiated for removal of excess volume.  A right femoral venous hemodialysis catheter was placed on 12/08/8766 complication.  Support continued with multiple vasopressors and inotropic agents.  She was also started on broad-spectrum antibiotics for suspected aspiration pneumonia.  A core track feeding tube was placed and nutritional support was initiated.  By the fifth postoperative day, she was still requiring an FiO2 of 90% in order to maintain adequate oxygenation.  Patient had an Echocardiogram which shows a R to L atrial shunt.  She continues to have marginal hemodynamics however pressor requirements were improving.  The patient developed fevers despite antibiotic coverage.  She again developed increasing pressor requirements.  She required transitioned to CVVHD.  It was felt the patient had a grim  prognosis.  Consultation with her family was done by Dr. Haroldine Laws and they changed patient's code status to no code.   ?

## 2021-11-09 NOTE — Progress Notes (Signed)
Changed by MD ?

## 2021-11-09 NOTE — Progress Notes (Signed)
? ?NAME:  [name], MRN:  [id number], DOB:  06-25-1956, LOS: 76 ?ADMISSION DATE:  10/25/2021, CONSULTATION DATE:  11/06/21 ?REFERRING MD:  [name], CHIEF COMPLAINT:  hypoxia  ? ?History of Present Illness:  ?[name] is a 66 year old woman with a history of HFrEF and paroxysmal atrial fibrillation who was transferred to [facility] with GI bleed and pericardial effusion.  She had recently undergone watchman insertion in January 2023.  She underwent EGD which did not explain bleeding.  Colonoscopy demonstrated sessile polyps, which were removed in the ascending colon and areas of ischemic colonic ulcers.  There was moderate sigmoid diverticulosis.  Heart catheterization demonstrated normal coronary arteries with equalization of left and right-sided heart pressures, severely reduced PAPi due to severe tricuspid regurgitation.  Swan-Ganz catheter was left in place to assist with weaning inotropic support.  Echocardiograms have previously demonstrated reduced RV function and dilation. On 2/28 she underwent Tricuspid repair and ring annuplasty. Post-op day 1 she was extubated but developed rapidly worsening respiratory failure overnight on 3/2 requiring re-intubation by anesthesia. CXR post-intubation had bilateral infiltrates. ? 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 ?Significant Hospital Events: ?Including procedures, antibiotic start and stop dates in addition to other pertinent events   ? ?2/20 admitted for pericardial effusion without tamponade ?2/22 L&R heart cath ?2/24 EGD & colonoscopy ?2/28 tricuspid valve repair; Vt arrest coming off bypass, back on bypass to stabilize ?3/1 extubated post op, reintubated overnight for hypoxia ?3/2 PCCM consulted for vent management.  Started on vancomycin and Zosyn for presumed aspiration pneumonia ?3/3 remains in a mixed picture of cardiogenic  and septic shock.  Meeting endorgan perfusion parameters and goal SCV O2 currently with mix of inotropic and vasoactive support desaturating with head down, Lasix administered. ? ?Interim History / Subjective:  ?O2 needs improved ?Starting to tolerate fluid removal on CRRT ?Remains intubated/paralyzed ?Pressor needs improved ? ?Objective   ?Blood pressure (!) 112/47, pulse 80, temperature (!) 97.5 ?F (36.4 ?C), resp. rate (!) 24, height '5\' 2"'$  (1.575 m), weight 107.5 kg, SpO2 100 %. ?PAP: (42-56)/(23-32) 47/28 ?CVP:  [16 mmHg-27 mmHg] 20 mmHg ?CO:  [4.3 L/min-5.2 L/min] 4.7 L/min ?CI:  [2.2 L/min/m2-2.4 L/min/m2] 2.3 L/min/m2  ?Vent Mode: PRVC ?FiO2 (%):  [90 %-100 %] 90 % ?Set Rate:  [22 bmp] 22 bmp ?Vt Set:  [400 mL] 400 mL ?PEEP:  [14 cmH20-20 cmH20] 14 cmH20 ?Plateau Pressure:  [37 cmH20-39 cmH20] 39 cmH20  ? ?Intake/Output Summary (Last 24 hours) at 11/09/2021 0848 ?Last data filed at 11/09/2021 0800 ?Gross per 24 hour  ?Intake 3670.19 ml  ?Output 5541 ml  ?Net -1870.81 ml  ? ? ?Filed Weights  ? 11/07/21 0427 11/08/21 0500 11/09/21 0500  ?Weight: 100.2 kg 108.6 kg 107.5 kg  ? ? ?Examination: ?Ill appearing unresponsive paralyzed woman ?Anasarca a bit better ?Abdomen softer today, now has BS ?Plats better on vent ?Lungs diminished bases, minimal ETT secretions ? ?Chemistry better on CRRT ?WBC up a bit more but started on stress steroids ? ?Resolved Hospital Problem list   ? ? ?Assessment & Plan:  ?Acute hypoxemic respiratory failure some combination aspiration, ARDS, CAP, pulmonary edema, a bit better today ?LBBB +/- adriamycin-induced NICM ?Progressive right heart failure complicated by severe tricuspid regurgitation s/p  ?TVR 2/28.  Procedure complicated by VT arrest coming off pump, brief. ?Fluid overloaded state of heart. ?Renal failure with oliguria- septic ATN and  cardiorenal now on CRRT ?Postoperative mixed cardiogenic and septic shock- improved but still severe ?Pericardial effusion moderate without  tamponade. ?Chronic AF, hx NSVT, AICD in place ?Ischemic colitis- with ABLA, was resolved,.  Had nonbloody BM 3/4. ?HLD, GERD, OSA ontolerant to BIPAP ?Hx breast ca 1998 with exposure to adriamycin ?Postop ileus- improved with reglan and subcutaneous neostigmine ?DM with hyperglycemia ? ?- CRRT with pull per TCTS and CHF teams ?- Continue vanc/zosyn ?- Titrate pressors to MAP 65; continue stress steroids ?- Continue reglan and subcutaneous neostigmine for another day ?- Lung protective ventilation on ARDSnet protocol, allow permissive hypercapnea, limit driving pressures, continue veletri and NMB with sedation titrated to BIS; once FiO2 and PEEP are more reasonable will wean NMB then veletri ?- Continue aggressive care, family aware of guarded prognosis ? ?Best Practice (right click and "Reselect all SmartList Selections" daily)  ? ?Diet/type: tubefeeds ?DVT prophylaxis: SCD ?GI prophylaxis: PPI ?Lines: Central line and Arterial Line ?Foley:  Yes, and it is still needed ?Code Status:  full code ?Last date of multidisciplinary goals of care discussion [name] discuss today] ? ? ?Patient critically ill due to multiorgan failure ?Interventions to address this today vent/CRRT/pressor titration ?Risk of deterioration without these interventions is high ? ?I personally spent 35 minutes providing critical care not including any separately billable procedures ? ?Natasha Emery MD ?[facility] ? ?Prefer epic messenger for cross cover needs ?If after hours, please call E-link ? ? ?

## 2021-11-09 NOTE — Progress Notes (Signed)
5 Days Post-Op Procedure(s) (LRB): ?TRICUSPID VALVE REPAIR USING EDWARDS MC3 TRICUSPID ANNULOPLASTY RING SIZE 28MM (N/A) ?TRANSESOPHAGEAL ECHOCARDIOGRAM (TEE) (N/A) ?Subjective: ? ?Remains intubated and sedated on vent. FiO2 down to 90% ? ?CRRT to keep negative 50-100/hr. -1400 cc last 24 hrs. Wt down 2 lbs. ? ?Hemodynamics stable on epi 13, NE 18, vaso 0.03.  methylene blue yesterday. ? ?Objective: ?Vital signs in last 24 hours: ?Temp:  [95.4 ?F (35.2 ?C)-100.9 ?F (38.3 ?C)] 97.5 ?F (36.4 ?C) (03/05 0900) ?Pulse Rate:  [70-89] 70 (03/05 0900) ?Cardiac Rhythm: A-V Sequential paced (03/05 0800) ?Resp:  [21-38] 24 (03/05 0900) ?BP: (91-118)/(26-55) 103/52 (03/05 0900) ?SpO2:  [79 %-100 %] 100 % (03/05 0900) ?Arterial Line BP: (93-141)/(45-100) 116/52 (03/05 0900) ?FiO2 (%):  [90 %-100 %] 90 % (03/05 0900) ?Weight:  [107.5 kg] 107.5 kg (03/05 0500) ? ?Hemodynamic parameters for last 24 hours: ?PAP: (42-56)/(23-32) 46/26 ?CVP:  [16 mmHg-27 mmHg] 20 mmHg ?CO:  [4.3 L/min-5.2 L/min] 4.7 L/min ?CI:  [2.2 L/min/m2-2.4 L/min/m2] 2.3 L/min/m2 ? ?Intake/Output from previous day: ?03/04 0701 - 03/05 0700 ?In: 3891.1 [I.V.:2773.1; NG/GT:690; IV Piggyback:428.1] ?Out: 5320 [Urine:65; Emesis/NG output:60; Chest Tube:50] ?Intake/Output this shift: ?Total I/O ?In: 272.2 [I.V.:232.2; NG/GT:40] ?Out: 529 [Urine:2; Other:527] ? ?General appearance: anasarca. ?Neurologic: heavily sedated on vent, not responsive. ?Heart: regular rate and rhythm ?Lungs: coarse BS ?Abdomen: soft, distended, + bowel sounds  ?Extremities: marked edema, warm. ?Wound: incision healing well. ? ?Lab Results: ?Recent Labs  ?  11/08/21 ?0422 11/08/21 ?0740 11/09/21 ?0408 11/09/21 ?2334  ?WBC 18.5*  --  19.9*  --   ?HGB 8.8*   < > 9.0* 9.5*  ?HCT 28.7*   < > 29.1* 28.0*  ?PLT 159  --  140*  --   ? < > = values in this interval not displayed.  ? ?BMET:  ?Recent Labs  ?  11/08/21 ?1533 11/09/21 ?0408 11/09/21 ?3568  ?NA 134* 135 137  ?K 3.6 3.7 3.3*  ?CL 100 101  --    ?CO2 21* 23  --   ?GLUCOSE 212* 201*  --   ?BUN 29* 21  --   ?CREATININE 2.05* 1.51*  --   ?CALCIUM 7.5* 7.5*  --   ?  ?PT/INR: No results for input(s): LABPROT, INR in the last 72 hours. ?ABG ?   ?Component Value Date/Time  ? PHART 7.294 (L) 11/09/2021 0551  ? HCO3 22.5 11/09/2021 0551  ? TCO2 24 11/09/2021 0551  ? ACIDBASEDEF 4.0 (H) 11/09/2021 0551  ? O2SAT 99 11/09/2021 0551  ? ?CBG (last 3)  ?Recent Labs  ?  11/08/21 ?0741 11/09/21 ?0413 11/09/21 ?6168  ?GLUCAP 155* 153* 151*  ? ? ?Assessment/Plan: ?S/P Procedure(s) (LRB): ?TRICUSPID VALVE REPAIR USING EDWARDS MC3 TRICUSPID ANNULOPLASTY RING SIZE 28MM (N/A) ?TRANSESOPHAGEAL ECHOCARDIOGRAM (TEE) (N/A) ? ?Hemodynamics more stable on multiple high dose vasopressors. Able to remove volume with CRRT.  ? ?VDRF due to ARDS/pneumonia/pulmonary edema. Sedated on versed and fentanyl and prn paralytic for desynchrony per CCM ?  ?Anuric renal failure. Continue CRRT -50-100 cc as tolerated. ? ?Tube feeds started for nutrition.Tolerating so far. ?  ?Fever yesterday am and leukocytosis: on Vanc and Zosyn. All cultures negative so far. ? ? LOS: 13 days  ? ? ?Fernande Boyden Ishan Sanroman ?11/09/2021 ? ? ?

## 2021-11-10 ENCOUNTER — Inpatient Hospital Stay (HOSPITAL_COMMUNITY): Payer: Medicare PPO

## 2021-11-10 DIAGNOSIS — R570 Cardiogenic shock: Secondary | ICD-10-CM | POA: Diagnosis not present

## 2021-11-10 DIAGNOSIS — I3139 Other pericardial effusion (noninflammatory): Secondary | ICD-10-CM | POA: Diagnosis not present

## 2021-11-10 DIAGNOSIS — J9601 Acute respiratory failure with hypoxia: Secondary | ICD-10-CM

## 2021-11-10 DIAGNOSIS — N179 Acute kidney failure, unspecified: Secondary | ICD-10-CM | POA: Diagnosis not present

## 2021-11-10 LAB — COOXEMETRY PANEL
Carboxyhemoglobin: 1.4 % (ref 0.5–1.5)
Carboxyhemoglobin: 1.3 % (ref 0.5–1.5)
Methemoglobin: 1.1 % (ref 0.0–1.5)
Methemoglobin: 1.9 % — ABNORMAL HIGH (ref 0.0–1.5)
O2 Saturation: 72.3 %
O2 Saturation: 84.6 %
Total hemoglobin: 8.7 g/dL — ABNORMAL LOW (ref 12.0–16.0)
Total hemoglobin: 9.4 g/dL — ABNORMAL LOW (ref 12.0–16.0)

## 2021-11-10 LAB — POCT I-STAT 7, (LYTES, BLD GAS, ICA,H+H)
Acid-base deficit: 2 mmol/L (ref 0.0–2.0)
Acid-base deficit: 1 mmol/L (ref 0.0–2.0)
Bicarbonate: 24.5 mmol/L (ref 20.0–28.0)
Bicarbonate: 26 mmol/L (ref 20.0–28.0)
Calcium, Ion: 1.12 mmol/L — ABNORMAL LOW (ref 1.15–1.40)
Calcium, Ion: 1.1 mmol/L — ABNORMAL LOW (ref 1.15–1.40)
HCT: 29 % — ABNORMAL LOW (ref 36.0–46.0)
HCT: 30 % — ABNORMAL LOW (ref 36.0–46.0)
Hemoglobin: 9.9 g/dL — ABNORMAL LOW (ref 12.0–15.0)
Hemoglobin: 10.2 g/dL — ABNORMAL LOW (ref 12.0–15.0)
O2 Saturation: 95 %
O2 Saturation: 100 %
Patient temperature: 36.8
Patient temperature: 36.4
Potassium: 4 mmol/L (ref 3.5–5.1)
Potassium: 4.1 mmol/L (ref 3.5–5.1)
Sodium: 137 mmol/L (ref 135–145)
Sodium: 135 mmol/L (ref 135–145)
TCO2: 26 mmol/L (ref 22–32)
TCO2: 28 mmol/L (ref 22–32)
pCO2 arterial: 46.2 mmHg (ref 32–48)
pCO2 arterial: 50.1 mmHg — ABNORMAL HIGH (ref 32–48)
pH, Arterial: 7.332 — ABNORMAL LOW (ref 7.35–7.45)
pH, Arterial: 7.32 — ABNORMAL LOW (ref 7.35–7.45)
pO2, Arterial: 82 mmHg — ABNORMAL LOW (ref 83–108)
pO2, Arterial: 287 mmHg — ABNORMAL HIGH (ref 83–108)

## 2021-11-10 LAB — RENAL FUNCTION PANEL
Albumin: 1.7 g/dL — ABNORMAL LOW (ref 3.5–5.0)
Anion gap: 9 (ref 5–15)
BUN: 18 mg/dL (ref 8–23)
CO2: 24 mmol/L (ref 22–32)
Calcium: 7.4 mg/dL — ABNORMAL LOW (ref 8.9–10.3)
Chloride: 100 mmol/L (ref 98–111)
Creatinine, Ser: 0.86 mg/dL (ref 0.44–1.00)
GFR, Estimated: >60 mL/min (ref >60)
Glucose, Bld: 195 mg/dL — ABNORMAL HIGH (ref 70–99)
Phosphorus: 1.5 mg/dL — ABNORMAL LOW (ref 2.5–4.6)
Potassium: 4.1 mmol/L (ref 3.5–5.1)
Sodium: 133 mmol/L — ABNORMAL LOW (ref 135–145)

## 2021-11-10 LAB — COMPREHENSIVE METABOLIC PANEL
ALT: 8 U/L (ref 0–44)
AST: 53 U/L — ABNORMAL HIGH (ref 15–41)
Albumin: 1.6 g/dL — ABNORMAL LOW (ref 3.5–5.0)
Alkaline Phosphatase: 335 U/L — ABNORMAL HIGH (ref 38–126)
Anion gap: 10 (ref 5–15)
BUN: 16 mg/dL (ref 8–23)
CO2: 24 mmol/L (ref 22–32)
Calcium: 7.6 mg/dL — ABNORMAL LOW (ref 8.9–10.3)
Chloride: 102 mmol/L (ref 98–111)
Creatinine, Ser: 0.94 mg/dL (ref 0.44–1.00)
GFR, Estimated: >60 mL/min (ref >60)
Glucose, Bld: 153 mg/dL — ABNORMAL HIGH (ref 70–99)
Potassium: 4.2 mmol/L (ref 3.5–5.1)
Sodium: 136 mmol/L (ref 135–145)
Total Bilirubin: 1.2 mg/dL (ref 0.3–1.2)
Total Protein: 5.2 g/dL — ABNORMAL LOW (ref 6.5–8.1)

## 2021-11-10 LAB — CBC
HCT: 28 % — ABNORMAL LOW (ref 36.0–46.0)
Hemoglobin: 8.9 g/dL — ABNORMAL LOW (ref 12.0–15.0)
MCH: 28.4 pg (ref 26.0–34.0)
MCHC: 31.8 g/dL (ref 30.0–36.0)
MCV: 89.5 fL (ref 80.0–100.0)
Platelets: 120 10*3/uL — ABNORMAL LOW (ref 150–400)
RBC: 3.13 MIL/uL — ABNORMAL LOW (ref 3.87–5.11)
RDW: 19.9 % — ABNORMAL HIGH (ref 11.5–15.5)
WBC: 17.7 10*3/uL — ABNORMAL HIGH (ref 4.0–10.5)
nRBC: 5.8 % — ABNORMAL HIGH (ref 0.0–0.2)

## 2021-11-10 LAB — GLUCOSE, CAPILLARY
Glucose-Capillary: 103 mg/dL — ABNORMAL HIGH (ref 70–99)
Glucose-Capillary: 136 mg/dL — ABNORMAL HIGH (ref 70–99)
Glucose-Capillary: 143 mg/dL — ABNORMAL HIGH (ref 70–99)
Glucose-Capillary: 162 mg/dL — ABNORMAL HIGH (ref 70–99)
Glucose-Capillary: 167 mg/dL — ABNORMAL HIGH (ref 70–99)
Glucose-Capillary: 193 mg/dL — ABNORMAL HIGH (ref 70–99)
Glucose-Capillary: 197 mg/dL — ABNORMAL HIGH (ref 70–99)

## 2021-11-10 LAB — APTT: aPTT: 45 seconds — ABNORMAL HIGH (ref 24–36)

## 2021-11-10 LAB — MAGNESIUM: Magnesium: 2.2 mg/dL (ref 1.7–2.4)

## 2021-11-10 LAB — PROCALCITONIN: Procalcitonin: 1.72 ng/mL

## 2021-11-10 LAB — HEPARIN LEVEL (UNFRACTIONATED): Heparin Unfractionated: <0.1 IU/mL — ABNORMAL LOW (ref 0.30–0.70)

## 2021-11-10 LAB — PHOSPHORUS: Phosphorus: 2.1 mg/dL — ABNORMAL LOW (ref 2.5–4.6)

## 2021-11-10 LAB — PATHOLOGIST SMEAR REVIEW

## 2021-11-10 NOTE — Progress Notes (Signed)
Echocardiogram ?2D Echocardiogram has been performed. ? ?Oneal Deputy Lambros Cerro RDCS ?11/10/2021, 11:16 AM ? ?Dr. Tamala Julian at bedside ?

## 2021-11-10 NOTE — Progress Notes (Signed)
Brittainy Simmons PA notified of lower CO/CI 2.79/1.43. This RN titrated epi gtt up to 5. VS otherwise stable. ?

## 2021-11-10 NOTE — Progress Notes (Signed)
? ?NAME:  [name], MRN:  [id number], DOB:  March 15, 1956, LOS: 30 ?ADMISSION DATE:  10/15/2021, CONSULTATION DATE:  11/06/21 ?REFERRING MD:  [name], CHIEF COMPLAINT:  hypoxia  ? ?History of Present Illness:  ?[name] is a 66 year old woman with a history of HFrEF and paroxysmal atrial fibrillation who was transferred to [facility] with GI bleed and pericardial effusion.  She had recently undergone watchman insertion in January 2023.  She underwent EGD which did not explain bleeding.  Colonoscopy demonstrated sessile polyps, which were removed in the ascending colon and areas of ischemic colonic ulcers.  There was moderate sigmoid diverticulosis.  Heart catheterization demonstrated normal coronary arteries with equalization of left and right-sided heart pressures, severely reduced PAPi due to severe tricuspid regurgitation.  Swan-Ganz catheter was left in place to assist with weaning inotropic support.  Echocardiograms have previously demonstrated reduced RV function and dilation. On 2/28 she underwent Tricuspid repair and ring annuplasty. Post-op day 1 she was extubated but developed rapidly worsening respiratory failure overnight on 3/2 requiring re-intubation by anesthesia. CXR post-intubation had bilateral infiltrates. ? 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 ?Significant Hospital Events: ?Including procedures, antibiotic start and stop dates in addition to other pertinent events   ? ?2/20 admitted for pericardial effusion without tamponade ?2/22 L&R heart cath ?2/24 EGD & colonoscopy ?2/28 tricuspid valve repair; Vt arrest coming off bypass, back on bypass to stabilize ?3/1 extubated post op, reintubated overnight for hypoxia ?3/2 PCCM consulted for vent management.  Started on vancomycin and Zosyn for presumed aspiration pneumonia ?3/3 remains in a mixed picture of cardiogenic  and septic shock.  Meeting endorgan perfusion parameters and goal SCV O2 currently with mix of inotropic and vasoactive support desaturating with head down, Lasix administered. ?3/6 Worsening oxygenation, requiring 100% FiO2, desaturations with minimal stimulation , CXR slightly worse, but not enough to explain worsening worsening respiratory status.  ? ?Interim History / Subjective:  ?Day 6 post op ?O2 needs improved ?Starting to tolerate fluid removal on CRRT ?Remains intubated/paralyzed ?Pressor needs improved ?Remains on EPI at 4,  norepi 16, vasopressin 0.03, In Sr on amiodarone drip ?Fio2 increased to 100%, sats dropping  with minimal stimulation .  14 PEEP, CT in place , small intermittent leak in chamber 2 noted, no additional drainage. ?CXR looks slightly worse, but ? If there is some shunting occurring.  ?Pulling about 100cc / he per CVVH, + 14, 880, minimal UO ? ?Hemodynamic parameters for last 24 hours: ?PAP: (12-54)/(5-30) 43/21 ?CVP:  [1 mmHg-20 mmHg] 13 mmHg ?CO:  [3.5 L/min-5.6 L/min] 4.2 L/min ?CI:  [1.8 L/min/m2-2.2 L/min/m2] 2.2 L/min/m2, Co Ox 72 ? ?Na 136, K 4.2, Creatinine 0.94, Albumin 1.6, Alk phos 335,( was 236 on 3/5)  AST 53, ALT 8 ? ?ABG  ?7.33/ 46.2/82/24.5 ?Objective   ?Blood pressure (!) 85/21, pulse 80, temperature 98.2 ?F (36.8 ?C), resp. rate (!) 25, height $RemoveBe'5\' 2"'AofCVfCcV$  (1.575 m), weight 104.7 kg, SpO2 92 %. ?PAP: (12-54)/(5-30) 46/20 ?CVP:  [6 mmHg-18 mmHg] 15 mmHg ?CO:  [3.5 L/min-5.6 L/min] 4.4 L/min ?CI:  [1.8 L/min/m2-2.3 L/min/m2] 2.3 L/min/m2  ?Vent Mode: PRVC ?FiO2 (%):  [60 %-100 %] 100 % ?Set Rate:  [28 bmp] 28 bmp ?Vt Set:  [400 mL] 400 mL ?PEEP:  [14 cmH20] 14 cmH20 ?Plateau Pressure:  [25 cmH20-32 cmH20] 26 cmH20  ? ?Intake/Output Summary (Last 24 hours) at 11/10/2021 1008 ?Last  data filed at 11/10/2021 0915 ?Gross per 24 hour  ?Intake 3608.86 ml  ?Output 6517 ml  ?Net -2908.14 ml  ? ?Filed Weights  ? 11/08/21 0500 11/09/21 0500 11/10/21 0500  ?Weight: 108.6 kg 107.5 kg 104.7 kg   ? ? ?Examination: ?General: Critically ill female, sedated and intubated, intermittent paralytics ?Skin: Warm and dry, clean dry and intact ?HEENT: ETT in place and secure, Cor Track in place and secure,  ?Neck: supple, +  JVD, no bruits , RIJ swan, No lymphadenopathy, noted ?Heart:S1S2 RRR without murmur, gallop, murmur or rub ?Lungs: Intubated on 100%, clear anteriorly, crackles per bases, No  rhonchi, or wheezes noted ?Abd: soft, non tender, + BS,  no hepatosplenomegaly, TF infusing at goal  ?Ext: 1 + bilateral  lower ext edema, 2+ pedal pulses, 2+ radial pulses ?Neuro: Sedated and intubated, Fentanyl and versed gtt's, intermittent paralytics ?Chemistry better on CRRT ?WBC up a bit more but started on stress steroids ? ?Resolved Hospital Problem list   ? ? ?Assessment & Plan:  ?Acute hypoxemic respiratory failure some combination aspiration, ARDS, CAP, pulmonary edema,  ?Increasing oxygen demands this am, CXR looks wet, intermittent leak in chamber 2 of CT.  ?LBBB +/- adriamycin-induced NICM ?Progressive right heart failure complicated by severe tricuspid regurgitation s/p  ?TVR 2/28.  Procedure complicated by VT arrest coming off pump, brief. ?Fluid overloaded state of heart.  ?Renal failure with oliguria- septic ATN and cardiorenal now on CRRT ?Postoperative mixed cardiogenic and septic shock- improved but still severe ?Pericardial effusion moderate without tamponade. ?Chronic AF, hx NSVT, AICD in place ?Ischemic colitis- with ABLA, was resolved,.  Had nonbloody BM 3/4. ?HLD, GERD, OSA ontolerant to BIPAP ?Hx breast ca 1998 with exposure to adriamycin ?Postop ileus- improved with reglan and subcutaneous neostigmine ?DM with hyperglycemia ?Increased Alk Phos, AST ? ?- CRRT with pull  100-150 cc per hour per TCTS and CHF teams ?- Trend CXR, Increased PEEP to 18 on 3/6, Check Echo with Bubble study ?- Monitor and replete electrolytes ?- Continue vanc/zosyn, trend fever curve and WBC, ?- Titrate pressors to MAP  65; continue stress steroids ?- Continue reglan and subcutaneous neostigmine for another day, TF at goal ?- Lung protective ventilation on ARDSnet protocol, allow permissive hypercapnea, limit driving pressures, continue veletri and NMB with sedation titrated to BIS; once FiO2 and PEEP are more reasonable will wean NMB then veletri ?- Increased Alk Phos, will trend  ?- Continue aggressive care, family aware of guarded prognosis ? ?Best Practice (right click and "Reselect all SmartList Selections" daily)  ? ?Diet/type: tubefeeds ?DVT prophylaxis: SCD ?GI prophylaxis: PPI ?Lines: Central line and Arterial Line ?Foley:  Yes, and it is still needed ?Code Status:  full code ?Last date of multidisciplinary goals of care discussion [name] discuss today] ? ? ?Patient critically ill due to multiorgan failure ?Interventions to address this today vent/CRRT/pressor titration/Drops in oxygen saturations/ increased Alk phos ?Risk of deterioration without these interventions is high ? ?I personally spent 40 minutes providing critical care not including any separately billable procedures ? ?Magdalen Spatz, MSN, AGACNP-BC ?[facility] ?See Amion for personal pager ?PCCM on call pager [phone]  ?11/10/2021 ?10:16 AM ? ? ?Prefer epic messenger for cross cover needs ?If after hours, please call E-link ? ? ?

## 2021-11-10 NOTE — Progress Notes (Signed)
6 Days Post-Op Procedure(s) (LRB): ?TRICUSPID VALVE REPAIR USING EDWARDS MC3 TRICUSPID ANNULOPLASTY RING SIZE 28MM (N/A) ?TRANSESOPHAGEAL ECHOCARDIOGRAM (TEE) (N/A) ?Subjective: ?Intubated, sedated ? ?Objective: ?Vital signs in last 24 hours: ?Temp:  [95.7 ?F (35.4 ?C)-98.4 ?F (36.9 ?C)] 98.4 ?F (36.9 ?C) (03/06 0715) ?Pulse Rate:  [70-82] 80 (03/06 0715) ?Cardiac Rhythm: A-V Sequential paced (03/06 0400) ?Resp:  [17-28] 28 (03/06 0715) ?BP: (93-121)/(27-55) 97/43 (03/06 0700) ?SpO2:  [88 %-100 %] 94 % (03/06 0715) ?Arterial Line BP: (93-142)/(50-66) 95/53 (03/06 0715) ?FiO2 (%):  [60 %-100 %] 80 % (03/06 0545) ?Weight:  [104.7 kg] 104.7 kg (03/06 0500) ? ?Hemodynamic parameters for last 24 hours: ?PAP: (12-54)/(5-30) 43/21 ?CVP:  [1 mmHg-20 mmHg] 13 mmHg ?CO:  [3.5 L/min-5.6 L/min] 4.2 L/min ?CI:  [1.8 L/min/m2-2.2 L/min/m2] 2.2 L/min/m2 ? ?Intake/Output from previous day: ?03/05 0701 - 03/06 0700 ?In: 3522.6 [I.V.:2179.6; NG/GT:440; IV Piggyback:903.1] ?Out: 5374 [Urine:27; Chest Tube:60] ?Intake/Output this shift: ?No intake/output data recorded. ? ?General appearance: intubated ?Neurologic: sedated ?Heart: regular rate and rhythm ?Lungs: clear anteriorly ?Extremities: edematous, warm, pink ? ?Lab Results: ?Recent Labs  ?  11/09/21 ?0408 11/09/21 ?8270 11/09/21 ?1501 11/10/21 ?0431  ?WBC 19.9*  --   --  17.7*  ?HGB 9.0*   < > 10.5* 8.9*  ?HCT 29.1*   < > 31.0* 28.0*  ?PLT 140*  --   --  120*  ? < > = values in this interval not displayed.  ? ?BMET:  ?Recent Labs  ?  11/09/21 ?1600 11/10/21 ?0431  ?NA 135 136  ?K 3.4* 4.2  ?CL 102 102  ?CO2 23 24  ?GLUCOSE 171* 153*  ?BUN 17 16  ?CREATININE 1.09* 0.94  ?CALCIUM 7.4* 7.6*  ?  ?PT/INR: No results for input(s): LABPROT, INR in the last 72 hours. ?ABG ?   ?Component Value Date/Time  ? PHART 7.369 11/09/2021 1501  ? HCO3 23.9 11/09/2021 1501  ? TCO2 25 11/09/2021 1501  ? ACIDBASEDEF 2.0 11/09/2021 1501  ? O2SAT 72.3 11/10/2021 0431  ? ?CBG (last 3)  ?Recent Labs  ?   11/09/21 ?2021 11/10/21 ?0007 11/10/21 ?0435  ?GLUCAP 175* 103* 136*  ? ? ?Assessment/Plan: ?S/P Procedure(s) (LRB): ?TRICUSPID VALVE REPAIR USING EDWARDS MC3 TRICUSPID ANNULOPLASTY RING SIZE 28MM (N/A) ?TRANSESOPHAGEAL ECHOCARDIOGRAM (TEE) (N/A) ?- remains critically ill ?NEURO- sedated, no recent wake up assessment ?CV- cardiac index Ok at 2.2, co-ox 72 ? Still on multiple pressors to maintain CO/ BP ? Epi '@4'$ , norepi 16, vasopressin 0.03 ? In Sr on amiodarone drip ?RESP- VDRF, still on high FiO2/ PEEP ? Now on 80% and 14 PEEP ? Desaturates with position change ? Leave CT in place for now ?RENAL- acute on chronic oliguric renal failure-  ? On CVVHD, 3L negative yesterday ?ENDO- CBG well controlled ?GI- tolerating TF ?ID- WBC trending down on Vanco + Zosyn ? ? ? LOS: 14 days  ? ? ?Melrose Nakayama ?11/10/2021 ? ? ?

## 2021-11-10 NOTE — Progress Notes (Signed)
EVENING ROUNDS NOTE : ? ?   ?[address] ?      [place] [address] ?            [phone]   ?              ?6 Days Post-Op ?Procedure(s) (LRB): ?TRICUSPID VALVE REPAIR USING EDWARDS MC3 TRICUSPID ANNULOPLASTY RING SIZE 28MM (N/A) ?TRANSESOPHAGEAL ECHOCARDIOGRAM (TEE) (N/A) ? ? ?Total Length of Stay:  LOS: 14 days  ?Events:   ?No events today ?Pulling fluid ? ? ? ? ?BP (!) 92/43   Pulse 80   Temp (!) 97.5 ?F (36.4 ?C)   Resp (!) 29   Ht '5\' 2"'$  (1.575 m)   Wt 104.7 kg   SpO2 100%   BMI 42.22 kg/m?  ? ?PAP: (12-54)/(5-31) 45/24 ?CVP:  [2 mmHg-22 mmHg] 15 mmHg ?CO:  [2.8 L/min-4.7 L/min] 3.7 L/min ?CI:  [1.4 L/min/m2-2.4 L/min/m2] 1.9 L/min/m2 ? ?Vent Mode: PRVC ?FiO2 (%):  [60 %-100 %] 70 % ?Set Rate:  [28 bmp] 28 bmp ?Vt Set:  [400 mL] 400 mL ?PEEP:  [14 cmH20-18 cmH20] 18 cmH20 ?Plateau Pressure:  [25 cmH20-39 cmH20] 36 cmH20 ? ?  prismasol BGK 4/2.5 800 mL/hr at 11/10/21 1505  ?  prismasol BGK 4/2.5 400 mL/hr at 11/10/21 1503  ? sodium chloride 20 mL/hr at 11/07/21 1800  ? sodium chloride    ? sodium chloride 5 mL/hr at 11/10/21 0800  ? sodium chloride    ? amiodarone 30 mg/hr (11/10/21 1800)  ? epinephrine 5 mcg/min (11/10/21 1800)  ? epoprostenol (VELETRI) for inhalation 1.'5mg'$ /25m (30,000 ng/mL) 50 ng/kg/min (11/10/21 1322)  ? feeding supplement (VITAL AF 1.2 CAL) 1,000 mL (11/10/21 0924)  ? fentaNYL infusion INTRAVENOUS 200 mcg/hr (11/10/21 1800)  ? heparin 10,000 units/ 20 mL infusion syringe 500 Units/hr (11/10/21 0034)  ? lactated ringers 5 mL/hr at 11/10/21 1800  ? midazolam 5 mg/hr (11/10/21 1800)  ? norepinephrine (LEVOPHED) Adult infusion 12 mcg/min (11/10/21 1800)  ? piperacillin-tazobactam Stopped (11/10/21 1631)  ? prismasol BGK 4/2.5 1,500 mL/hr at 11/10/21 1805  ? vancomycin 200 mL/hr at 11/10/21 1800  ? vasopressin 0.03 Units/min (11/10/21 1800)  ? ? ?I/O last 3 completed shifts: ?In: 4928.3 [I.V.:3455.2; NG/GT:470; IV Piggyback:1003.1] ?Out: 150539[Urine:37; OJQBHA:1937  Chest Tube:110] ? ? ?CBC Latest Ref Rng & Units 11/10/2021 11/10/2021 11/10/2021  ?WBC 4.0 - 10.5 K/uL - - 17.7(H)  ?Hemoglobin 12.0 - 15.0 g/dL 10.2(L) 9.9(L) 8.9(L)  ?Hematocrit 36.0 - 46.0 % 30.0(L) 29.0(L) 28.0(L)  ?Platelets 150 - 400 K/uL - - 120(L)  ? ? ?BMP Latest Ref Rng & Units 11/10/2021 11/10/2021 11/10/2021  ?Glucose 70 - 99 mg/dL - 195(H) -  ?BUN 8 - 23 mg/dL - 18 -  ?Creatinine 0.44 - 1.00 mg/dL - 0.86 -  ?BUN/Creat Ratio 12 - 28 - - -  ?Sodium 135 - 145 mmol/L 135 133(L) 137  ?Potassium 3.5 - 5.1 mmol/L 4.1 4.1 4.0  ?Chloride 98 - 111 mmol/L - 100 -  ?CO2 22 - 32 mmol/L - 24 -  ?Calcium 8.9 - 10.3 mg/dL - 7.4(L) -  ? ? ?ABG ?   ?Component Value Date/Time  ? PHART 7.320 (L) 11/10/2021 1613  ? PCO2ART 50.1 (H) 11/10/2021 1613  ? PO2ART 287 (H) 11/10/2021 1613  ? HCO3 26.0 11/10/2021 1613  ? TCO2 28 11/10/2021 1613  ? ACIDBASEDEF 1.0 11/10/2021 1613  ? O2SAT 100 11/10/2021 1613  ? ? ? ? ? ?HMelodie Bouillon MD ?11/10/2021 6:50 PM ? ? ?

## 2021-11-10 NOTE — Progress Notes (Addendum)
Advanced Heart Failure Rounding Note  PCP-Cardiologist: None   Subjective:   02/20: Transferred to [facility] for GI bleed + pericardial effusion 02/22: R/LHC with normal cors, equalized R and L sided pressures with no evidence of LV-RV interaction, RA 17, PA 33/21 (25), Fick CO/CI 4.2/2.2, severely reduced PAPi (in setting of severe TR), PA sat 47%, 48% 2/24: S/P Two polyps in the distal ascending colon, removed with a cold snare. Resected and retrieved. Right colonic ischemic ulcers (healing, no active bleeding, biopsied)-likely etiology of LGI. Moderate sigmoid diverticulosis. 2/25: Diuresed with IV lasix.  2/26: Switched to torsemide 60 /60 .  2/28: S/P TVR . Off pump had VT/VT when PA catheter placed. Had cardiac massage. Cardioverted x2. Unsuccessful. Placed back on pump with restoration of SR. Started on IV amiodarone.  3/1: Extubated 3/2: Reintubated for acute hypoxic respiratory failure. Stat CXR ? Possible b/l PNA. Concern for developing septic shock, WBC 39K. PCT 2.49. Increased pressor requirements w/ NE and VP. Milrinone discontinued w/ low SVR. Several rounds of bicarb for persistent metabolic acidosis. Started on vanc + zosyn. Transfused 1u RBCs 3/4: CVVH initiated   This morning, remains intubated, FiO2, 80%. F/u CXR today shows similar to slightly worsened bilateral, basilar predominant, diffuse interstitial thickening.  Remains on Epoprostenol, 50 ng/kg/min.  Did not tolerate pressor weaning overnight. RN had to titrate back up. Currently on Epi 4, NE 16, VP 0.03. MAP 65   Amio gtt at 30/hr. A-paced, 80s   Remains anuric. CVVH ongoing with net UF 50-100 cc/hr.  I/O net negative 3.3 L yesterday. CVP 13-14.    Remains on Vanc and Zosyn. WBCs 39>>31>>18.5>>20>>17K. Cultures NGTD.  Swan #s CVP 13-14 PAP 40/20 (28) CI 2.17 PAPi 1.42 Co-ox 72% SVR 965  Remains Intubated and sedated.   Objective:   Weight Range: 104.7 kg Body mass index is 42.22 kg/m.   Vital  Signs:   Temp:  [95.7 F (35.4 C)-98.6 F (37 C)] 98.2 F (36.8 C) (03/06 0815) Pulse Rate:  [70-82] 80 (03/06 0815) Resp:  [17-28] 28 (03/06 0815) BP: (93-121)/(27-55) 94/36 (03/06 0800) SpO2:  [88 %-100 %] 92 % (03/06 0815) Arterial Line BP: (92-142)/(50-66) 101/50 (03/06 0815) FiO2 (%):  [60 %-90 %] 80 % (03/06 0800) Weight:  [104.7 kg] 104.7 kg (03/06 0500) Last BM Date : 11/09/21  Weight change: Filed Weights   11/08/21 0500 11/09/21 0500 11/10/21 0500  Weight: 108.6 kg 107.5 kg 104.7 kg    Intake/Output:   Intake/Output Summary (Last 24 hours) at 11/10/2021 0825 Last data filed at 11/10/2021 0800 Gross per 24 hour  Intake 3481.77 ml  Output 6567 ml  Net -3085.23 ml      Physical Exam   CVP 14  General:  critically ill, intubated and sedated  HEENT: normal + ETT  Neck: supple. JVD 14 cm + Rt IJ swan cath Carotids 2+ bilat; no bruits. No lymphadenopathy or thyromegaly appreciated. Cor: PMI nondisplaced. Regular rate & rhythm. No rubs, gallops or murmurs. + CTs  Lungs: intubated, clear anteriorly  Abdomen: soft, nontender, nondistended. No hepatosplenomegaly. No bruits or masses. Good bowel sounds. Extremities: no cyanosis, clubbing, rash, 1+ b/l Le edema Neuro: intubated and sedated    Telemetry   A paced 80s. No further VT /VF. Personally reviewed.    Labs    CBC Recent Labs    11/09/21 0408 11/09/21 0551 11/09/21 1501 11/10/21 0431  WBC 19.9*  --   --  17.7*  HGB 9.0*   < >  10.5* 8.9*  HCT 29.1*   < > 31.0* 28.0*  MCV 90.7  --   --  89.5  PLT 140*  --   --  120*   < > = values in this interval not displayed.   Basic Metabolic Panel Recent Labs    11/09/21 0408 11/09/21 0551 11/09/21 1600 11/10/21 0431  NA 135   < > 135 136  K 3.7   < > 3.4* 4.2  CL 101  --  102 102  CO2 23  --  23 24  GLUCOSE 201*  --  171* 153*  BUN 21  --  17 16  CREATININE 1.51*  --  1.09* 0.94  CALCIUM 7.5*  --  7.4* 7.6*  MG 2.4  --   --  2.2  PHOS 2.4*  --  <1.0*  2.1*   < > = values in this interval not displayed.   Liver Function Tests Recent Labs    11/09/21 0408 11/09/21 1600 11/10/21 0431  AST 61*  --  53*  ALT 8  --  8  ALKPHOS 236*  --  335*  BILITOT 1.0  --  1.2  PROT 5.0*  --  5.2*  ALBUMIN 1.7* 1.7* 1.6*   No results for input(s): LIPASE, AMYLASE in the last 72 hours. Cardiac Enzymes No results for input(s): CKTOTAL, CKMB, CKMBINDEX, TROPONINI in the last 72 hours.  BNP: BNP (last 3 results) Recent Labs    02/24/21 1357 07/16/21 1145 10/20/2021 2138  BNP 326.4* 523.7* 477.9*    ProBNP (last 3 results) No results for input(s): PROBNP in the last 8760 hours.   D-Dimer No results for input(s): DDIMER in the last 72 hours. Hemoglobin A1C No results for input(s): HGBA1C in the last 72 hours.  Fasting Lipid Panel No results for input(s): CHOL, HDL, LDLCALC, TRIG, CHOLHDL, LDLDIRECT in the last 72 hours.   Thyroid Function Tests No results for input(s): TSH, T4TOTAL, T3FREE, THYROIDAB in the last 72 hours.  Invalid input(s): FREET3   Other results:   Imaging    DG CHEST PORT 1 VIEW  Result Date: 11/10/2021 CLINICAL DATA:  Chest tube EXAM: PORTABLE CHEST 1 VIEW COMPARISON:  November 10, 2011 FINDINGS: Similar position of the endotracheal tube with tip in the distal thoracic esophagus. Swan-Ganz catheter with tip projecting over the right main pulmonary artery. Enteric feeding catheter coursing below the diaphragm with tip obscured by collimation. Right chest AICD/pacemaker with leads in unchanged position. Atrial appendage occlusion device. Stable enlarged cardiomediastinal silhouette. Bilateral thoracostomy tubes unchanged in position. Similar to slightly worsened bilateral, basilar predominant, diffuse interstitial thickening. No visible pleural effusion or pneumothorax. No acute osseous abnormality IMPRESSION: 1. Similar to slightly worsened bilateral, basilar predominant, diffuse interstitial thickening. 2. Stable lines  and tubes as above. Similar position of endotracheal tube with tip in the distal thoracic esophagus. 3. Enlarged cardiac silhouette, similar prior. Electronically Signed   By: Dahlia Bailiff M.D.   On: 11/10/2021 08:04     Medications:     Scheduled Medications:  acetaminophen (TYLENOL) oral liquid 160 mg/5 mL  650 mg Per Tube Q6H   artificial tears  1 application Both Eyes M2L   aspirin EC  325 mg Oral Daily   Or   aspirin  324 mg Per Tube Daily   atorvastatin  10 mg Per Tube q AM   bisacodyl  10 mg Oral Daily   Or   bisacodyl  10 mg Rectal Daily   buPROPion  75  mg Per Tube BID   chlorhexidine gluconate (MEDLINE KIT)  15 mL Mouth Rinse BID   Chlorhexidine Gluconate Cloth  6 each Topical Daily   docusate  100 mg Per Tube BID   feeding supplement (PROSource TF)  45 mL Per Tube BID   hydrocortisone sod succinate (SOLU-CORTEF) inj  100 mg Intravenous Q12H   insulin aspart  0-24 Units Subcutaneous Q4H   insulin detemir  8 Units Subcutaneous BID   mouth rinse  15 mL Mouth Rinse 10 times per day   metoCLOPramide (REGLAN) injection  5 mg Intravenous Q6H   neostigmine  0.25 mg Subcutaneous Q6H   pantoprazole sodium  40 mg Per Tube Daily   polyethylene glycol  17 g Per Tube Daily   sennosides  10 mL Per Tube BID   sodium chloride flush  10-40 mL Intracatheter Q12H   sodium chloride flush  3 mL Intravenous Q12H   venlafaxine  75 mg Per Tube BID WC    Infusions:   prismasol BGK 4/2.5 800 mL/hr at 11/09/21 1925    prismasol BGK 4/2.5 400 mL/hr at 11/09/21 1252   sodium chloride 20 mL/hr at 11/07/21 1800   sodium chloride     sodium chloride 5 mL/hr at 11/10/21 0700   sodium chloride     amiodarone 30 mg/hr (11/10/21 0800)   epinephrine 4 mcg/min (11/10/21 0800)   epoprostenol (VELETRI) for inhalation 1.30m/50mL (30,000 ng/mL) 50 ng/kg/min (11/10/21 0402)   feeding supplement (VITAL AF 1.2 CAL) 55 mL/hr at 11/10/21 0700   fentaNYL infusion INTRAVENOUS 175 mcg/hr (11/10/21 0800)    heparin 10,000 units/ 20 mL infusion syringe 500 Units/hr (11/10/21 0034)   lactated ringers 5 mL/hr at 11/10/21 0700   midazolam 5 mg/hr (11/10/21 0800)   norepinephrine (LEVOPHED) Adult infusion 16 mcg/min (11/10/21 0800)   piperacillin-tazobactam Stopped (11/10/21 0458)   prismasol BGK 4/2.5 1,500 mL/hr at 11/10/21 0758   vancomycin Stopped (11/09/21 1903)   vasopressin 0.03 Units/min (11/10/21 0800)     PRN Medications: sodium chloride, Place/Maintain arterial line **AND** sodium chloride, fentaNYL, heparin, lip balm, metoprolol tartrate, midazolam, ondansetron (ZOFRAN) IV, rocuronium bromide, sodium chloride, sodium chloride flush, sodium chloride flush     Patient Profile    66y.o. female with h/o PAF with recent failed ablation S/p Watchman implant 08/21/21,  breast CA in 1998 s/p left breast surgery and s/p Adriamycin x 4 cycles, non-ischemic cardiomyopathy/chronic systolic CHF, LBBB, s/p BiV Medtronic ICD, HTN, OSA, and CKD Stage IIIb. Transferred from OSH for acute GIB and pericardial effusion. Initial Echo here showed EF 35-40% RV severely enlarged w/ low normal systolic fx. Moderate posterior effusion, no tamponade. Severe TR noted. R/LCH showed normal cors, LVEF 40%, Equalized right and left-sided pressure with no evidence of LV-RV interaction and evidence of severe RV failure w/ severely reduced PAPi, 0.6, in setting of severe TR. Went to OR 2/28 for TVR. Off pump had VT/VF when PA catheter placed. Had cardiac massage. Cardioverted x2. Unsuccessful. Placed back on pump with restoration of SR. Started on IV amiodarone.   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 Acute on Chronic Systolic CHF w/ Post Cardiotomy Shock  - felt to have LBBB CM vs chemo induced (got adriamycin). EF 25-30% in 2007 -> improved with CRT - s/p Medtronic BiV ICD - echo 4/22 40-45% with septal dyssynchrony - echo 10/22 EF  25-30% -TEE 01/23: EF 45-50%, D-shaped LV, RV mildly reduced,  severe TR with flail leaflets likely d/t chordal rupture. No effusion -Echo 02/21: EF 35-40%, RV severely enlarged with okay function, moderate MR, severe TR with flail posterior leaflet -RHC 02/22: RA 17, PA 33/21 (25), PCWP 18, PAPi 0.6 (in setting of severe TR), PA sat 47%, 48% -S/p TVR 2/28. Intraop TEE EF 40-45%, RV mildly reduced.  VT arrest when coming off pump  - Milrinone discontinued 3/2 w/ low SVR - Mixed septic/cardiogenic shock.  CI 2.17 today with SVR still relatively low at 965.  PAPI still low at 1.42  Today on Epi 4, NE 16, VP 0.03, continue to gradually lower pressor doses (now coming down).  - On Broad spectrum vanc/Zosyn. Pan-cultures NGTD.  - Continue to follow hemodynamics off swan - Still markedly volume overloaded, continue CVVH with net UF aiming 50-100 cc/hr.     3. Acute Hypoxic Respiratory Failure - Extubated 3/1 - Reintubated 3/2. CXR ? B/l PNA  - PCT 2.49>>3.66>>4.15 - WBC 24>>36>>34>39>31>>18.5>>20>>17K. Covering w/ empiric abx, Vanc + zosyn  - Cultures NGTD - CXR with bilateral diffuse infiltrates, suspect PNA + volume overload/pulmonary edema. FiO2 80%, needs fluid off.  - Fluid removal via CVVH.   4. Pericardial effusion: -Doubt from Watchman procedure in 12/22 -Effusion appears moderate in size on echo 02/21.  -Fowler 02/22 with equalization of right and left-sided pressures with no evidence of LV-RV interaction -There was no evidence of tamponade. Suspect main issue is severe TR with markedly elevated RA pressures and inability to drain pericardium.  5. PAF/tachycardia - Unable to complete ablation 10/22 d/t pericardial effusion - S/p Watchman implant 08/21/21 - continue amio gtt 30 mg/hr while on inotropes/ pressors  - heparin through CVVHD circuit   6. AKI on CKD IIIb/IV/ ATN - Creatinine up to 2.5, oliguric nearing anuric. Suspect ATN.  - In setting of GI bleed and hypotension +/-  cardiorenal  - Marked volume overload with no response to IV diuretics => now on CVVH, aim today for net UF 50-100 cc/hr.    6. GI bleeding - BRBPR + melena 4-5 days prior to admission - Per her report CT at OSH with diverticulosis but no diverticulitis - 1 u PRBCs this admit -EGD/Colonoscopy- R colonic ischemic ulcers - suspect in the setting of low output hf., 2 polyps removed, diverticulosis. Polyps no malignancy   - On PPI  - Follow H/Hg, hgb 9, transfuse < 8.   7. OSA - Unable to tolerate CPAP  8. Hypokalemia/ hypomagnesia - K /Mag stable.   9. Ileus - suspect related to mesenteric ischemia in setting of low arterial pressure and high venous outflow pressures from RV failure - No h/o ischemic colitis symptoms - Has CT abdomen from OSH (non contrast) no documentation of ab arterial calcification - Improvintg w/ Reglan, neostigmine. BM on 3/5. Can stop Neostigmine.   10. VT/VF -Intraoperative. Placed on amio drip.  -stable, no recurrence  -continue amio gtt -keep K >4.0 and Mg >2.0   Length of Stay: [address] PA-C  11/10/2021, 8:25 AM  Advanced Heart Failure Team Pager [phone] (M-F; 7a - 5p)  Please contact [facility] for night-coverage after hours (5p -7a ) and weekends on [email or web address]  Patient seen with PA, agree with the above note.   Limited echo for bubble study done today was positive with bubbles crossing the septum.   Swan numbers as above.  She remains on CVVH with CVP 13-14.    She is on NE 16, vasopressin 0.03, epinephrine 4.  She is on inhaled epoprostenol.   General: Sedated on vent Neck: JVP 12 cm, no thyromegaly or thyroid nodule.  Lungs: Dependent crackles.  CV: Nondisplaced PMI.  Heart regular S1/S2, no S3/S4, no murmur.  1+ ankle edema.  Abdomen: Soft, nontender, no hepatosplenomegaly, no distention.  Skin: Intact without lesions or rashes.  Neurologic: Sedated Extremities: No clubbing or cyanosis.  HEENT: Normal.   Still with  high FiO2 => 0.8 up to 1 this morning.  Continue vancomycin/Zosyn empiric coverage.  Needs more fluid off, currently able to pull 100 cc/hr net UF by CVVH, will continue.  Continue inhaled epoprostenol until oxygen saturation better.    Wean norepinephrine as able, MAP stable today.   She has an atrial-level shunt by bubble study today.  She has had both AF ablation and Watchman with trans-septal punctures in the last year.  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  With elevated RA pressure, right=>left shunting will be promoted with worsening oxygenation.  Push fluid removal, continue epoprostenol.  Also need to draw CVP from CVP port (will be higher form PA port).   CRITICAL CARE Performed by: Loralie Champagne  [place] critical care time: 40 minutes  Critical care time was exclusive of separately billable procedures and treating other patients.  Critical care was necessary to treat or prevent imminent or life-threatening deterioration.  Critical care was time spent personally by me on the following activities: development of treatment plan with patient and/or surrogate as well as nursing, discussions with consultants, evaluation of patient's response to treatment, examination of patient, obtaining history from patient or surrogate, ordering and performing treatments and interventions, ordering and review of laboratory studies, ordering and review of radiographic studies, pulse oximetry and re-evaluation of patient's condition.

## 2021-11-10 NOTE — Progress Notes (Signed)
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  Placed on CRRT for volume control.  Hemodynamics stable on epinephrine, norepinephrine and vasopressin.  Amiodarone drip for atrial fibrillation  Blood pressure 96/53 pulse 80 temperature 98 O2 sats 95% FiO2 80% CVP 13  Negative fluid balance 100 cc an hour  Sodium 136 potassium 4.2 chloride 102 CO2 24 BUN 16 creatinine 0.94 glucose 153 phosphorus 2.1 calcium 7.6 albumin 1.6 hemoglobin 8.9   Objective:  Vital signs in last 24 hours:  Temp:  [95.7 F (35.4 C)-98.2 F (36.8 C)] 98.2 F (36.8 C) (03/06 0700) Pulse Rate:  [70-82] 80 (03/06 0700) Resp:  [17-28] 28 (03/06 0700) BP: (93-121)/(27-55) 97/43 (03/06 0700) SpO2:  [88 %-100 %] 94 % (03/06 0700) Arterial Line BP: (93-142)/(50-66) 100/55 (03/06 0700) FiO2 (%):  [60 %-100 %] 80 % (03/06 0545) Weight:  [104.7 kg] 104.7 kg (03/06 0500)  Weight change: -2.8 kg Filed Weights   11/08/21 0500 11/09/21 0500 11/10/21 0500  Weight: 108.6 kg 107.5 kg 104.7 kg    Intake/Output: I/O last 3 completed shifts: In: 4928.3 [I.V.:3455.2; NG/GT:470; IV Piggyback:1003.1] Out: 10063 [Urine:37; JKDTO:6712; Chest Tube:110]   Intake/Output this shift:  No intake/output data recorded.  CVS- RRR RS- CTA ABD- BS present soft non-distended EXT- no edema   Basic Metabolic Panel: Recent Labs  Lab 11/05/21 1600 11/05/21 2103 11/07/21 0305 11/07/21 0524 11/08/21 0422 11/08/21 0740 11/08/21 1533 11/09/21 0408 11/09/21 0551 11/09/21 1025 11/09/21 1501 11/09/21 1600 11/10/21 0431  NA  --    < > 129*   < > 131*   < > 134* 135 137 137 138 135 136  K  --    < > 3.9   < > 3.8   < > 3.6 3.7 3.3* 3.5 3.4* 3.4* 4.2  CL  --    < > 97*  --  97*  --  100 101  --   --   --  102 102  CO2  --    < > 20*  --  18*  --  21* 23  --   --   --  23 24   GLUCOSE  --    < > 89  --  146*  --  212* 201*  --   --   --  171* 153*  BUN  --    < > 25*  --  33*  --  29* 21  --   --   --  17 16  CREATININE  --    < > 1.93*  --  2.51*  --  2.05* 1.51*  --   --   --  1.09* 0.94  CALCIUM  --    < > 7.9*  --  7.8*  --  7.5* 7.5*  --   --   --  7.4* 7.6*  MG 2.8*  --  2.4  --  2.2  --   --  2.4  --   --   --   --  2.2  PHOS  --    < > 5.0*  --  5.4*  --  3.9 2.4*  --   --   --  <1.0* 2.1*   < > = values in this interval not displayed.    Liver Function Tests: Recent Labs  Lab 11/06/21 1518 11/07/21 0305 11/08/21 0422 11/08/21 1533 11/09/21  0408 11/09/21 1600 11/10/21 0431  AST 89* 98* 87*  --  61*  --  53*  ALT _0 --  8  --  8  ALKPHOS 120 143* 192*  --  236*  --  335*  BILITOT 1.5* 0.8 1.2  --  1.0  --  1.2  PROT 4.6* 4.9* 4.8*  --  5.0*  --  5.2*  ALBUMIN 2.3* 2.1* 1.8* 1.6* 1.7* 1.7* 1.6*   No results for input(s): LIPASE, AMYLASE in the last 168 hours. No results for input(s): AMMONIA in the last 168 hours.  CBC: Recent Labs  Lab 11/06/21 1518 11/06/21 1700 11/07/21 0305 11/07/21 0524 11/08/21 0422 11/08/21 0740 11/09/21 0408 11/09/21 0551 11/09/21 1025 11/09/21 1501 11/10/21 0431  WBC 39.2*  --  31.6*  --  18.5*  --  19.9*  --   --   --  17.7*  HGB 9.9*   < > 9.7*   < > 8.8*   < > 9.0* 9.5* 10.2* 10.5* 8.9*  HCT 31.4*   < > 30.6*   < > 28.7*   < > 29.1* 28.0* 30.0* 31.0* 28.0*  MCV 90.8  --  90.3  --  92.9  --  90.7  --   --   --  89.5  PLT 257  --  238  --  159  --  140*  --   --   --  120*   < > = values in this interval not displayed.    Cardiac Enzymes: No results for input(s): CKTOTAL, CKMB, CKMBINDEX, TROPONINI in the last 168 hours.  BNP: Invalid input(s): POCBNP  CBG: Recent Labs  Lab 11/09/21 1212 11/09/21 1631 11/09/21 2021 11/10/21 0007 11/10/21 0435  GLUCAP 212* 161* 175* 103* 136*    Microbiology: Results for orders placed or performed during the hospital encounter of 10/23/2021  MRSA Next  Gen by PCR, Nasal     Status: None   Collection Time: 11/02/2021  8:41 PM   Specimen: Nasal Mucosa; Nasal Swab  Result Value Ref Range Status   MRSA by PCR Next Gen NOT DETECTED NOT DETECTED Final    Comment: (NOTE) The GeneXpert MRSA Assay (FDA approved for NASAL specimens only), is one component of a comprehensive MRSA colonization surveillance program. It is not intended to diagnose MRSA infection nor to guide or monitor treatment for MRSA infections. Test performance is not FDA approved in patients less than 26 years old. Performed at [facility], [place] [address] [place], [place] [address]   Resp Panel by RT-PCR (Flu A&B, Covid) Nasopharyngeal Swab     Status: None   Collection Time: 10/08/2021  9:13 PM   Specimen: Nasopharyngeal Swab; Nasopharyngeal(NP) swabs in vial transport medium  Result Value Ref Range Status   SARS Coronavirus 2 by RT PCR NEGATIVE NEGATIVE Final    Comment: (NOTE) SARS-CoV-2 target nucleic acids are NOT DETECTED.  The SARS-CoV-2 RNA is generally detectable in upper respiratory specimens during the acute phase of infection. The lowest concentration of SARS-CoV-2 viral copies this assay can detect is 138 copies/mL. A negative result does not preclude SARS-Cov-2 infection and should not be used as the sole basis for treatment or other patient management decisions. A negative result may occur with  improper specimen collection/handling, submission of specimen other than nasopharyngeal swab, presence of viral mutation(s) within the areas targeted by this assay, and inadequate number of viral copies(<138 copies/mL). A negative result must be combined with clinical  observations, patient history, and epidemiological information. The expected result is Negative.  Fact Sheet for Patients:  EntrepreneurPulse.com.au  Fact Sheet for Healthcare Providers:  IncredibleEmployment.be  This test is no t yet approved or cleared by  the [place] FDA and  has been authorized for detection and/or diagnosis of SARS-CoV-2 by FDA under an Emergency Use Authorization (EUA). This EUA will remain  in effect (meaning this test can be used) for the duration of the COVID-19 declaration under Section 564(b)(1) of the Act, 21 U.S.C.section 360bbb-3(b)(1), unless the authorization is terminated  or revoked sooner.       Influenza A by PCR NEGATIVE NEGATIVE Final   Influenza B by PCR NEGATIVE NEGATIVE Final    Comment: (NOTE) The Xpert Xpress SARS-CoV-2/FLU/RSV plus assay is intended as an aid in the diagnosis of influenza from Nasopharyngeal swab specimens and should not be used as a sole basis for treatment. Nasal washings and aspirates are unacceptable for Xpert Xpress SARS-CoV-2/FLU/RSV testing.  Fact Sheet for Patients: EntrepreneurPulse.com.au  Fact Sheet for Healthcare Providers: IncredibleEmployment.be  This test is not yet approved or cleared by the [place] FDA and has been authorized for detection and/or diagnosis of SARS-CoV-2 by FDA under an Emergency Use Authorization (EUA). This EUA will remain in effect (meaning this test can be used) for the duration of the COVID-19 declaration under Section 564(b)(1) of the Act, 21 U.S.C. section 360bbb-3(b)(1), unless the authorization is terminated or revoked.  Performed at [facility], [place] [address] [place], [place] [address]   Culture, blood (routine x 2)     Status: None   Collection Time: 10/28/21  4:48 PM   Specimen: BLOOD  Result Value Ref Range Status   Specimen Description BLOOD SHOULDER RIGHT  Final   Special Requests   Final    BOTTLES DRAWN AEROBIC AND ANAEROBIC Blood Culture adequate volume   Culture   Final    NO GROWTH 5 DAYS Performed at [facility], [place] [address] [place], [place] [address]    Report Status 11/02/2021 FINAL  Final  Culture, blood (routine x 2)     Status: None    Collection Time: 10/28/21  5:01 PM   Specimen: BLOOD  Result Value Ref Range Status   Specimen Description BLOOD RIGHT ANTECUBITAL  Final   Special Requests   Final    BOTTLES DRAWN AEROBIC AND ANAEROBIC Blood Culture results may not be optimal due to an inadequate volume of blood received in culture bottles   Culture   Final    NO GROWTH 5 DAYS Performed at [facility], [place] [address], [place], [place] [address]    Report Status 11/02/2021 FINAL  Final  Surgical pcr screen     Status: None   Collection Time: 11/01/21  4:00 PM   Specimen: Nasal Mucosa; Nasal Swab  Result Value Ref Range Status   MRSA, PCR NEGATIVE NEGATIVE Final   Staphylococcus aureus NEGATIVE NEGATIVE Final    Comment: (NOTE) The Xpert SA Assay (FDA approved for NASAL specimens in patients 41 years of age and older), is one component of a comprehensive surveillance program. It is not intended to diagnose infection nor to guide or monitor treatment. Performed at [facility], [place] [address], [place], [place] [address]   MRSA Next Gen by PCR, Nasal     Status: None   Collection Time: 11/06/21  7:10 AM   Specimen: Nasal Mucosa; Nasal Swab  Result Value Ref Range  Status   MRSA by PCR Next Gen NOT DETECTED NOT DETECTED Final    Comment: (NOTE) The GeneXpert MRSA Assay (FDA approved for NASAL specimens only), is one component of a comprehensive MRSA colonization surveillance program. It is not intended to diagnose MRSA infection nor to guide or monitor treatment for MRSA infections. Test performance is not FDA approved in patients less than 41 years old. Performed at [facility], [place] [address], [place], [place] [address]   Culture, Respiratory w Gram Stain     Status: None   Collection Time: 11/06/21 12:08 PM   Specimen: Tracheal Aspirate  Result Value Ref Range Status   Specimen Description TRACHEAL ASPIRATE  Final   Special Requests NONE  Final   Gram Stain   Final    ABUNDANT WBC  PRESENT, PREDOMINANTLY PMN NO ORGANISMS SEEN    Culture   Final    RARE CANDIDA ALBICANS NO STAPHYLOCOCCUS AUREUS ISOLATED No Pseudomonas species isolated Performed at [facility], [place] [address], [place], [place] [address]    Report Status 11/09/2021 FINAL  Final  Culture, blood (Routine X 2) w Reflex to ID Panel     Status: None (Preliminary result)   Collection Time: 11/06/21  2:33 PM   Specimen: BLOOD  Result Value Ref Range Status   Specimen Description BLOOD RIGHT ANTECUBITAL  Final   Special Requests   Final    BOTTLES DRAWN AEROBIC AND ANAEROBIC Blood Culture adequate volume   Culture   Final    NO GROWTH 3 DAYS Performed at [facility], [place] [address] [place], [place] [address]    Report Status PENDING  Incomplete  Culture, blood (Routine X 2) w Reflex to ID Panel     Status: None (Preliminary result)   Collection Time: 11/06/21  2:40 PM   Specimen: BLOOD RIGHT WRIST  Result Value Ref Range Status   Specimen Description BLOOD RIGHT WRIST  Final   Special Requests   Final    BOTTLES DRAWN AEROBIC AND ANAEROBIC Blood Culture adequate volume   Culture   Final    NO GROWTH 3 DAYS Performed at [facility], [place] [address] [place], [place] [address]    Report Status PENDING  Incomplete  Urine Culture     Status: None   Collection Time: 11/06/21  3:40 PM   Specimen: Urine, Random  Result Value Ref Range Status   Specimen Description URINE, RANDOM  Final   Special Requests NONE  Final   Culture   Final    NO GROWTH Performed at [facility], [place] [address], [place], [place] [address]    Report Status 11/07/2021 FINAL  Final    Coagulation Studies: No results for input(s): LABPROT, INR in the last 72 hours.  Urinalysis: No results for input(s): COLORURINE, LABSPEC, PHURINE, GLUCOSEU, HGBUR, BILIRUBINUR, KETONESUR, PROTEINUR, UROBILINOGEN, NITRITE, LEUKOCYTESUR in the last 72 hours.  Invalid input(s): APPERANCEUR    Imaging: DG CHEST  PORT 1 VIEW  Result Date: 11/09/2021 CLINICAL DATA:  Status post cardiac surgery, chest tube placement EXAM: PORTABLE CHEST 1 VIEW COMPARISON:  11/08/2021, 11/07/2021 FINDINGS: Endotracheal tube with the tip 2.5 cm above the carina. Nasogastric tube coursing below the diaphragm. Right jugular Swan-Ganz catheter insertion with the tip projecting over the right main pulmonary artery. Bilateral chest tubes. Bilateral diffuse interstitial thickening more prominent at the lung bases. No pleural effusion or pneumothorax. Stable cardiomediastinal silhouette. Prior median sternotomy. Three lead cardiac pacemaker.  No pleural effusion or pneumothorax. Stable cardiomediastinal silhouette. No acute osseous abnormality. IMPRESSION: 1. Endotracheal tube with the tip 2.5 cm above the carina. 2. Nasogastric tube coursing below the diaphragm. 3. Right jugular Swan-Ganz catheter insertion with the tip projecting over the right main pulmonary artery. 4. Bilateral chest tubes. No pneumothorax. 5. Stable bilateral airspace disease likely reflecting a combination of interstitial edema and atelectasis. Electronically Signed   By: Kathreen Devoid M.D.   On: 11/09/2021 10:02   DG CHEST PORT 1 VIEW  Result Date: 11/08/2021 CLINICAL DATA:  Endotracheal tube. EXAM: PORTABLE CHEST 1 VIEW COMPARISON:  Chest x-ray 11/08/2021. FINDINGS: Endotracheal tube tip is 1.3 above the carina. Enteric tube extends below the diaphragm. Swan-Ganz catheter tip projects over the right main pulmonary artery, unchanged. Bilateral pleural drains are stable in position. Right-sided pacemaker again seen. Sternotomy wires are present. The heart is enlarged, unchanged. Bilateral mid and lower lung airspace disease has slightly decreased on the right. There is no pleural effusion or pneumothorax. No acute fractures are seen. IMPRESSION: 1. Decreasing bilateral airspace. 2. Endotracheal tube tip 1.3 cm above the carina. Other lines and tubes are stable in position.  Electronically Signed   By: Ronney Asters M.D.   On: 11/08/2021 21:53   DG Abd Portable 1V  Result Date: 11/08/2021 CLINICAL DATA:  Abdominal distension, open heart surgery 10/21/2021 EXAM: PORTABLE ABDOMEN - 1 VIEW COMPARISON:  11/07/2021 FINDINGS: Enteric tube with the tip projecting over the pylorus. Severe gastric distension. Gaseous distension of the ascending colon. Relative lack of small bowel air which may reflect decompressed small bowel versus fluid-filled bowel. No evidence of pneumoperitoneum, portal venous gas or pneumatosis. No pathologic calcifications along the expected course of the ureters. No acute osseous abnormality. IMPRESSION: 1. Enteric tube with the tip projecting over the pylorus. 2. Severe gastric distension. Gaseous distension of the ascending colon. Overall findings concerning for an ileus. Electronically Signed   By: Kathreen Devoid M.D.   On: 11/08/2021 09:43     Medications:     prismasol BGK 4/2.5 800 mL/hr at 11/09/21 1925    prismasol BGK 4/2.5 400 mL/hr at 11/09/21 1252   sodium chloride 20 mL/hr at 11/07/21 1800   sodium chloride     sodium chloride 10 mL/hr at 11/06/21 0800   sodium chloride     amiodarone 30 mg/hr (11/10/21 0700)   epinephrine 4 mcg/min (11/10/21 0700)   epoprostenol (VELETRI) for inhalation 1.17m/50mL (30,000 ng/mL) 50 ng/kg/min (11/10/21 0402)   feeding supplement (VITAL AF 1.2 CAL) 55 mL/hr at 11/10/21 0700   fentaNYL infusion INTRAVENOUS 175 mcg/hr (11/10/21 0700)   heparin 10,000 units/ 20 mL infusion syringe 500 Units/hr (11/10/21 0034)   lactated ringers 5 mL/hr at 11/08/21 1603   midazolam 5 mg/hr (11/10/21 0700)   norepinephrine (LEVOPHED) Adult infusion 16 mcg/min (11/10/21 0700)   piperacillin-tazobactam Stopped (11/10/21 0458)   prismasol BGK 4/2.5 1,500 mL/hr at 11/09/21 1742   vancomycin Stopped (11/09/21 1903)   vasopressin 0.03 Units/min (11/10/21 0700)    acetaminophen (TYLENOL) oral liquid 160 mg/5 mL  650 mg Per Tube  Q6H   artificial tears  1 application Both Eyes QS4H  aspirin EC  325 mg Oral Daily   Or   aspirin  324 mg Per Tube Daily   atorvastatin  10 mg Per Tube q AM   bisacodyl  10 mg Oral Daily   Or   bisacodyl  10 mg Rectal Daily   buPROPion  75 mg Per  Tube BID   chlorhexidine gluconate (MEDLINE KIT)  15 mL Mouth Rinse BID   Chlorhexidine Gluconate Cloth  6 each Topical Daily   docusate  100 mg Per Tube BID   feeding supplement (PROSource TF)  45 mL Per Tube BID   hydrocortisone sod succinate (SOLU-CORTEF) inj  100 mg Intravenous Q12H   insulin aspart  0-24 Units Subcutaneous Q4H   insulin detemir  8 Units Subcutaneous BID   mouth rinse  15 mL Mouth Rinse 10 times per day   metoCLOPramide (REGLAN) injection  5 mg Intravenous Q6H   neostigmine  0.25 mg Subcutaneous Q6H   pantoprazole sodium  40 mg Per Tube Daily   polyethylene glycol  17 g Per Tube Daily   sennosides  10 mL Per Tube BID   sodium chloride flush  10-40 mL Intracatheter Q12H   sodium chloride flush  3 mL Intravenous Q12H   venlafaxine  75 mg Per Tube BID WC   sodium chloride, Place/Maintain arterial line **AND** sodium chloride, fentaNYL, heparin, lip balm, metoprolol tartrate, midazolam, ondansetron (ZOFRAN) IV, rocuronium bromide, sodium chloride, sodium chloride flush, sodium chloride flush  Assessment/ Plan:  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  Started on CRRT 11/08/2021 with right femoral temporal dialysis catheter placed. ANEMIA-no ESA's at this point. MBD-appears stable HTN/VOL-continue ultrafiltration.  100 cc an hour ACCESS-femoral dialysis catheter    LOS: [place] _0 _1 :18 AM

## 2021-11-11 ENCOUNTER — Inpatient Hospital Stay (HOSPITAL_COMMUNITY): Payer: Medicare PPO

## 2021-11-11 DIAGNOSIS — I3139 Other pericardial effusion (noninflammatory): Secondary | ICD-10-CM | POA: Diagnosis not present

## 2021-11-11 LAB — POCT I-STAT 7, (LYTES, BLD GAS, ICA,H+H)
Acid-base deficit: 2 mmol/L (ref 0.0–2.0)
Acid-base deficit: 3 mmol/L — ABNORMAL HIGH (ref 0.0–2.0)
Acid-base deficit: 2 mmol/L (ref 0.0–2.0)
Acid-base deficit: 3 mmol/L — ABNORMAL HIGH (ref 0.0–2.0)
Acid-base deficit: 2 mmol/L (ref 0.0–2.0)
Acid-base deficit: 1 mmol/L (ref 0.0–2.0)
Bicarbonate: 24.7 mmol/L (ref 20.0–28.0)
Bicarbonate: 23.9 mmol/L (ref 20.0–28.0)
Bicarbonate: 24.1 mmol/L (ref 20.0–28.0)
Bicarbonate: 24.2 mmol/L (ref 20.0–28.0)
Bicarbonate: 25 mmol/L (ref 20.0–28.0)
Bicarbonate: 25.2 mmol/L (ref 20.0–28.0)
Calcium, Ion: 1.14 mmol/L — ABNORMAL LOW (ref 1.15–1.40)
Calcium, Ion: 1.11 mmol/L — ABNORMAL LOW (ref 1.15–1.40)
Calcium, Ion: 1.11 mmol/L — ABNORMAL LOW (ref 1.15–1.40)
Calcium, Ion: 1.09 mmol/L — ABNORMAL LOW (ref 1.15–1.40)
Calcium, Ion: 1.12 mmol/L — ABNORMAL LOW (ref 1.15–1.40)
Calcium, Ion: 1.12 mmol/L — ABNORMAL LOW (ref 1.15–1.40)
HCT: 31 % — ABNORMAL LOW (ref 36.0–46.0)
HCT: 30 % — ABNORMAL LOW (ref 36.0–46.0)
HCT: 30 % — ABNORMAL LOW (ref 36.0–46.0)
HCT: 28 % — ABNORMAL LOW (ref 36.0–46.0)
HCT: 29 % — ABNORMAL LOW (ref 36.0–46.0)
HCT: 28 % — ABNORMAL LOW (ref 36.0–46.0)
Hemoglobin: 10.5 g/dL — ABNORMAL LOW (ref 12.0–15.0)
Hemoglobin: 10.2 g/dL — ABNORMAL LOW (ref 12.0–15.0)
Hemoglobin: 10.2 g/dL — ABNORMAL LOW (ref 12.0–15.0)
Hemoglobin: 9.5 g/dL — ABNORMAL LOW (ref 12.0–15.0)
Hemoglobin: 9.9 g/dL — ABNORMAL LOW (ref 12.0–15.0)
Hemoglobin: 9.5 g/dL — ABNORMAL LOW (ref 12.0–15.0)
O2 Saturation: 97 %
O2 Saturation: 98 %
O2 Saturation: 96 %
O2 Saturation: 97 %
O2 Saturation: 89 %
O2 Saturation: 93 %
Patient temperature: 36.7
Patient temperature: 36.5
Patient temperature: 37.5
Patient temperature: 36.9
Patient temperature: 36.8
Potassium: 4.5 mmol/L (ref 3.5–5.1)
Potassium: 4.2 mmol/L (ref 3.5–5.1)
Potassium: 4.2 mmol/L (ref 3.5–5.1)
Potassium: 3.9 mmol/L (ref 3.5–5.1)
Potassium: 4 mmol/L (ref 3.5–5.1)
Potassium: 3.9 mmol/L (ref 3.5–5.1)
Sodium: 136 mmol/L (ref 135–145)
Sodium: 137 mmol/L (ref 135–145)
Sodium: 136 mmol/L (ref 135–145)
Sodium: 138 mmol/L (ref 135–145)
Sodium: 137 mmol/L (ref 135–145)
Sodium: 138 mmol/L (ref 135–145)
TCO2: 26 mmol/L (ref 22–32)
TCO2: 25 mmol/L (ref 22–32)
TCO2: 25 mmol/L (ref 22–32)
TCO2: 26 mmol/L (ref 22–32)
TCO2: 27 mmol/L (ref 22–32)
TCO2: 27 mmol/L (ref 22–32)
pCO2 arterial: 48.9 mmHg — ABNORMAL HIGH (ref 32–48)
pCO2 arterial: 47.1 mmHg (ref 32–48)
pCO2 arterial: 46.9 mmHg (ref 32–48)
pCO2 arterial: 50.3 mmHg — ABNORMAL HIGH (ref 32–48)
pCO2 arterial: 51.3 mmHg — ABNORMAL HIGH (ref 32–48)
pCO2 arterial: 48.9 mmHg — ABNORMAL HIGH (ref 32–48)
pH, Arterial: 7.31 — ABNORMAL LOW (ref 7.35–7.45)
pH, Arterial: 7.31 — ABNORMAL LOW (ref 7.35–7.45)
pH, Arterial: 7.322 — ABNORMAL LOW (ref 7.35–7.45)
pH, Arterial: 7.291 — ABNORMAL LOW (ref 7.35–7.45)
pH, Arterial: 7.296 — ABNORMAL LOW (ref 7.35–7.45)
pH, Arterial: 7.319 — ABNORMAL LOW (ref 7.35–7.45)
pO2, Arterial: 96 mmHg (ref 83–108)
pO2, Arterial: 114 mmHg — ABNORMAL HIGH (ref 83–108)
pO2, Arterial: 95 mmHg (ref 83–108)
pO2, Arterial: 101 mmHg (ref 83–108)
pO2, Arterial: 63 mmHg — ABNORMAL LOW (ref 83–108)
pO2, Arterial: 74 mmHg — ABNORMAL LOW (ref 83–108)

## 2021-11-11 LAB — RENAL FUNCTION PANEL
Albumin: 2.3 g/dL — ABNORMAL LOW (ref 3.5–5.0)
Anion gap: 9 (ref 5–15)
BUN: 21 mg/dL (ref 8–23)
CO2: 25 mmol/L (ref 22–32)
Calcium: 7.4 mg/dL — ABNORMAL LOW (ref 8.9–10.3)
Chloride: 102 mmol/L (ref 98–111)
Creatinine, Ser: 0.81 mg/dL (ref 0.44–1.00)
GFR, Estimated: >60 mL/min (ref >60)
Glucose, Bld: 183 mg/dL — ABNORMAL HIGH (ref 70–99)
Phosphorus: 2.3 mg/dL — ABNORMAL LOW (ref 2.5–4.6)
Potassium: 4.1 mmol/L (ref 3.5–5.1)
Sodium: 136 mmol/L (ref 135–145)

## 2021-11-11 LAB — GLUCOSE, CAPILLARY
Glucose-Capillary: 134 mg/dL — ABNORMAL HIGH (ref 70–99)
Glucose-Capillary: 134 mg/dL — ABNORMAL HIGH (ref 70–99)
Glucose-Capillary: 155 mg/dL — ABNORMAL HIGH (ref 70–99)
Glucose-Capillary: 164 mg/dL — ABNORMAL HIGH (ref 70–99)
Glucose-Capillary: 172 mg/dL — ABNORMAL HIGH (ref 70–99)
Glucose-Capillary: 179 mg/dL — ABNORMAL HIGH (ref 70–99)
Glucose-Capillary: 190 mg/dL — ABNORMAL HIGH (ref 70–99)
Glucose-Capillary: 204 mg/dL — ABNORMAL HIGH (ref 70–99)
Glucose-Capillary: 205 mg/dL — ABNORMAL HIGH (ref 70–99)
Glucose-Capillary: 213 mg/dL — ABNORMAL HIGH (ref 70–99)
Glucose-Capillary: 239 mg/dL — ABNORMAL HIGH (ref 70–99)

## 2021-11-11 LAB — COMPREHENSIVE METABOLIC PANEL
ALT: 12 U/L (ref 0–44)
AST: 61 U/L — ABNORMAL HIGH (ref 15–41)
Albumin: 1.7 g/dL — ABNORMAL LOW (ref 3.5–5.0)
Alkaline Phosphatase: 429 U/L — ABNORMAL HIGH (ref 38–126)
Anion gap: 9 (ref 5–15)
BUN: 18 mg/dL (ref 8–23)
CO2: 22 mmol/L (ref 22–32)
Calcium: 7.6 mg/dL — ABNORMAL LOW (ref 8.9–10.3)
Chloride: 101 mmol/L (ref 98–111)
Creatinine, Ser: 0.82 mg/dL (ref 0.44–1.00)
GFR, Estimated: >60 mL/min (ref >60)
Glucose, Bld: 198 mg/dL — ABNORMAL HIGH (ref 70–99)
Potassium: 4.5 mmol/L (ref 3.5–5.1)
Sodium: 132 mmol/L — ABNORMAL LOW (ref 135–145)
Total Bilirubin: 1.2 mg/dL (ref 0.3–1.2)
Total Protein: 5.7 g/dL — ABNORMAL LOW (ref 6.5–8.1)

## 2021-11-11 LAB — CULTURE, BLOOD (ROUTINE X 2)
Culture: NO GROWTH
Culture: NO GROWTH
Special Requests: ADEQUATE
Special Requests: ADEQUATE

## 2021-11-11 LAB — CBC WITH DIFFERENTIAL/PLATELET
Abs Immature Granulocytes: 1.26 10*3/uL — ABNORMAL HIGH (ref 0.00–0.07)
Basophils Absolute: 0.1 10*3/uL (ref 0.0–0.1)
Basophils Relative: 1 %
Eosinophils Absolute: 0.1 10*3/uL (ref 0.0–0.5)
Eosinophils Relative: 0 %
HCT: 29.6 % — ABNORMAL LOW (ref 36.0–46.0)
Hemoglobin: 9 g/dL — ABNORMAL LOW (ref 12.0–15.0)
Immature Granulocytes: 6 %
Lymphocytes Relative: 5 %
Lymphs Abs: 1.1 10*3/uL (ref 0.7–4.0)
MCH: 27.9 pg (ref 26.0–34.0)
MCHC: 30.4 g/dL (ref 30.0–36.0)
MCV: 91.6 fL (ref 80.0–100.0)
Monocytes Absolute: 1 10*3/uL (ref 0.1–1.0)
Monocytes Relative: 5 %
Neutro Abs: 17.2 10*3/uL — ABNORMAL HIGH (ref 1.7–7.7)
Neutrophils Relative %: 83 %
Platelets: 161 10*3/uL (ref 150–400)
RBC: 3.23 MIL/uL — ABNORMAL LOW (ref 3.87–5.11)
RDW: 20.4 % — ABNORMAL HIGH (ref 11.5–15.5)
WBC: 20.7 10*3/uL — ABNORMAL HIGH (ref 4.0–10.5)
nRBC: 10.6 % — ABNORMAL HIGH (ref 0.0–0.2)

## 2021-11-11 LAB — MAGNESIUM: Magnesium: 2.4 mg/dL (ref 1.7–2.4)

## 2021-11-11 LAB — COOXEMETRY PANEL
Carboxyhemoglobin: 2.6 % — ABNORMAL HIGH (ref 0.5–1.5)
Carboxyhemoglobin: 1.2 % (ref 0.5–1.5)
Carboxyhemoglobin: 1.6 % — ABNORMAL HIGH (ref 0.5–1.5)
Carboxyhemoglobin: 1.8 % — ABNORMAL HIGH (ref 0.5–1.5)
Methemoglobin: 0.7 % (ref 0.0–1.5)
Methemoglobin: 0.7 % (ref 0.0–1.5)
Methemoglobin: 1.5 % (ref 0.0–1.5)
Methemoglobin: 0.7 % (ref 0.0–1.5)
O2 Saturation: 92 %
O2 Saturation: 56.8 %
O2 Saturation: 55.8 %
O2 Saturation: 54.6 %
Total hemoglobin: 9.4 g/dL — ABNORMAL LOW (ref 12.0–16.0)
Total hemoglobin: <6.9 g/dL — CL (ref 12.0–16.0)
Total hemoglobin: 9 g/dL — ABNORMAL LOW (ref 12.0–16.0)
Total hemoglobin: 8.2 g/dL — ABNORMAL LOW (ref 12.0–16.0)

## 2021-11-11 LAB — APTT: aPTT: 40 seconds — ABNORMAL HIGH (ref 24–36)

## 2021-11-11 LAB — HEPARIN LEVEL (UNFRACTIONATED): Heparin Unfractionated: <0.1 IU/mL — ABNORMAL LOW (ref 0.30–0.70)

## 2021-11-11 LAB — PHOSPHORUS: Phosphorus: 1.4 mg/dL — ABNORMAL LOW (ref 2.5–4.6)

## 2021-11-11 LAB — AMMONIA: Ammonia: 62 umol/L — ABNORMAL HIGH (ref 9–35)

## 2021-11-11 MED ORDER — SODIUM CHLORIDE 0.9 % IV SOLN
0.0000 ug/min | INTRAVENOUS | Status: DC
  Administered 2021-11-11: 6 ug/min via INTRAVENOUS
  Administered 2021-11-12: 5 ug/min via INTRAVENOUS
  Administered 2021-11-14: 4 ug/min via INTRAVENOUS
  Administered 2021-11-16: 5 ug/min via INTRAVENOUS
  Administered 2021-11-17: 9 ug/min via INTRAVENOUS
  Filled 2021-11-11 (×6): qty 10

## 2021-11-11 MED ORDER — ALBUMIN HUMAN 25 % IV SOLN
25.0000 g | Freq: Four times a day (QID) | INTRAVENOUS | Status: AC
  Administered 2021-11-11 – 2021-11-12 (×4): 25 g via INTRAVENOUS
  Filled 2021-11-11 (×4): qty 100

## 2021-11-11 MED ORDER — CHLORHEXIDINE GLUCONATE CLOTH 2 % EX PADS
6.0000 | MEDICATED_PAD | Freq: Every day | CUTANEOUS | Status: DC
  Administered 2021-11-12 – 2021-11-17 (×6): 6 via TOPICAL

## 2021-11-11 MED ORDER — SODIUM PHOSPHATES 45 MMOLE/15ML IV SOLN
30.0000 mmol | Freq: Once | INTRAVENOUS | Status: AC
  Administered 2021-11-11: 30 mmol via INTRAVENOUS
  Filled 2021-11-11: qty 10

## 2021-11-11 NOTE — Progress Notes (Signed)
Co-ox drawn from PA port on [place], more accurate than CVP port d/t shunt. ?

## 2021-11-11 NOTE — Progress Notes (Addendum)
Advanced Heart Failure Rounding Note  PCP-Cardiologist: None   Subjective:   02/20: Transferred to [facility] for GI bleed + pericardial effusion 02/22: R/LHC with normal cors, equalized R and L sided pressures with no evidence of LV-RV interaction, RA 17, PA 33/21 (25), Fick CO/CI 4.2/2.2, severely reduced PAPi (in setting of severe TR), PA sat 47%, 48% 2/24: S/P Two polyps in the distal ascending colon, removed with a cold snare. Resected and retrieved. Right colonic ischemic ulcers (healing, no active bleeding, biopsied)-likely etiology of LGI. Moderate sigmoid diverticulosis. 2/25: Diuresed with IV lasix.  2/26: Switched to torsemide 60 /60 .  2/28: S/P TVR . Off pump had VT/VT when PA catheter placed. Had cardiac massage. Cardioverted x2. Unsuccessful. Placed back on pump with restoration of SR. Started on IV amiodarone.  3/1: Extubated 3/2: Reintubated for acute hypoxic respiratory failure. Stat CXR ? Possible b/l PNA. Concern for developing septic shock, WBC 39K. PCT 2.49. Increased pressor requirements w/ NE and VP. Milrinone discontinued w/ low SVR. Several rounds of bicarb for persistent metabolic acidosis. Started on vanc + zosyn. 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 LVEF 45-50%, D-shaped interventricular septum, RV moderately reduced, trivial TR    Remains intubated and sedated. FiO2 70%   Remains on Epoprostenol, 50 ng/kg/min.  Epi 6, NE 10, VP 0.03. CI and PAPi lower today (see below). SVR 1294.   Swan #s CVP 13-14 PAP 40/23 (27) CO 3.03  CI 1.55 PAPi 1.2 Co-ox 57% (repeat pending, doubt hgb on co-ox was accurate, <6.9) SVR 1294   Remains anuric. 6.9 L volume removal off CVVH yesterday but I/Os only net negative 2.7 L for the day. CVP 13-14. Still 36 lb above pre-op wt.   Remains on Vanc and Zosyn. WBCs 39>>31>>18.5>>20>>17>>20K.  AF. Cultures NGTD.   Objective:   Weight Range: 101.5 kg Body mass index is 40.93 kg/m.   Vital Signs:   Temp:  [97.3 F (36.3 C)-98.4 F (36.9 C)] 98.1 F (36.7 C) (03/07 0845) Pulse Rate:  [80-82] 80 (03/07 0845) Resp:  [20-29] 28 (03/07 0845) BP: (71-101)/(21-62) 71/57 (03/07 0800) SpO2:  [82 %-100 %] 97 % (03/07 0845) Arterial Line BP: (85-114)/(44-62) 95/49 (03/07 0845) FiO2 (%):  [70 %-100 %] 70 % (03/07 0800) Weight:  [101.5 kg] 101.5 kg (03/07 0400) Last BM Date : 11/09/21  Weight change: Filed Weights   11/09/21 0500 11/10/21 0500 11/11/21 0400  Weight: 107.5 kg 104.7 kg 101.5 kg    Intake/Output:   Intake/Output Summary (Last 24 hours) at 11/11/2021 0855 Last data filed at 11/11/2021 0800 Gross per 24 hour  Intake 4174.11 ml  Output 6895 ml  Net -2720.89 ml      Physical Exam   CVP 13-14  General:  critically ill, intubated and sedated  HEENT: normal + ETT  + cortrak  Neck: supple. JVD to jaw + Rt IJ swan cath Carotids 2+ bilat; no bruits. No lymphadenopathy or thyromegaly appreciated. Cor: PMI nondisplaced. Regular rate & rhythm. No rubs, gallops or murmurs. + CTs  Lungs: intubated and clear Abdomen: soft, nontender, nondistended. No hepatosplenomegaly. No bruits or masses. Good bowel sounds. Extremities: no cyanosis, clubbing, rash, 2+ b/l Le edema Neuro: intubated and sedated    Telemetry   A paced 80s. No further VT /VF. Personally reviewed.    Labs    CBC Recent Labs    11/10/21 0431 11/10/21  1001 11/11/21 0312 11/11/21 0403  WBC 17.7*  --  20.7*  --   NEUTROABS  --   --  17.2*  --   HGB 8.9*   < > 9.0* 10.2*  HCT 28.0*   < > 29.6* 30.0*  MCV 89.5  --  91.6  --   PLT 120*  --  161  --    < > = values in this interval not displayed.   Basic Metabolic Panel Recent Labs    11/10/21 0431 11/10/21 1001 11/10/21 1555 11/10/21 1613 11/11/21 0312 11/11/21 0403  NA 136   < > 133*   < > 132* 137  K 4.2   < > 4.1   < > 4.5 4.2  CL  102  --  100  --  101  --   CO2 24  --  24  --  22  --   GLUCOSE 153*  --  195*  --  198*  --   BUN 16  --  18  --  18  --   CREATININE 0.94  --  0.86  --  0.82  --   CALCIUM 7.6*  --  7.4*  --  7.6*  --   MG 2.2  --   --   --  2.4  --   PHOS 2.1*  --  1.5*  --  1.4*  --    < > = values in this interval not displayed.   Liver Function Tests Recent Labs    11/10/21 0431 11/10/21 1555 11/11/21 0312  AST 53*  --  61*  ALT 8  --  12  ALKPHOS 335*  --  429*  BILITOT 1.2  --  1.2  PROT 5.2*  --  5.7*  ALBUMIN 1.6* 1.7* 1.7*   No results for input(s): LIPASE, AMYLASE in the last 72 hours. Cardiac Enzymes No results for input(s): CKTOTAL, CKMB, CKMBINDEX, TROPONINI in the last 72 hours.  BNP: BNP (last 3 results) Recent Labs    02/24/21 1357 07/16/21 1145 10/30/2021 2138  BNP 326.4* 523.7* 477.9*    ProBNP (last 3 results) No results for input(s): PROBNP in the last 8760 hours.   D-Dimer No results for input(s): DDIMER in the last 72 hours. Hemoglobin A1C No results for input(s): HGBA1C in the last 72 hours.  Fasting Lipid Panel No results for input(s): CHOL, HDL, LDLCALC, TRIG, CHOLHDL, LDLDIRECT in the last 72 hours.   Thyroid Function Tests No results for input(s): TSH, T4TOTAL, T3FREE, THYROIDAB in the last 72 hours.  Invalid input(s): FREET3   Other results:   Imaging    DG CHEST PORT 1 VIEW  Result Date: 11/11/2021 CLINICAL DATA:  Chest tube present. EXAM: PORTABLE CHEST 1 VIEW COMPARISON:  Yesterday FINDINGS: Endotracheal tube tip is at the carina. Chest tubes in place with trace right apical pneumothorax, stable. Feeding tube at least reaches the stomach. Swan-Ganz catheter from the right with tip at the lower right hilum. Stable cardiomegaly. Left atrial implant and prior median sternotomy. Haziness of the bilateral lower chest which is improved. Improved generalized haziness of the chest. IMPRESSION: 1. Lower endotracheal tube with tip at the carina. 2.  Unchanged trace right apical pneumothorax. 3. Improved edema/pulmonary inflation. Electronically Signed   By: Jorje Guild M.D.   On: 11/11/2021 07:03   ECHOCARDIOGRAM LIMITED BUBBLE STUDY  Result Date: 11/10/2021    ECHOCARDIOGRAM LIMITED REPORT   Patient Name:   CHARNELLE BERGEMAN Date of Exam: 11/10/2021 Medical  Rec #:  [id number]            Height:       62.0 in Accession #:    [id number]           Weight:       230.8 lb Date of Birth:  07/05/1956            BSA:          2.032 m Patient Age:    7 years             BP:           107/55 mmHg Patient Gender: F                    HR:           82 bpm. Exam Location:  Inpatient Procedure: Limited Echo, Color Doppler and Cardiac Doppler STAT ECHO Indications:    Respiratory Distress  History:        Patient has prior history of Echocardiogram examinations, most                 recent 11/03/2021. Arrythmias:Atrial Fibrillation; Risk                 Factors:Dyslipidemia and Sleep Apnea. S/p watchman on 08/21/21                 and TV repair on 11/03/2021.  Sonographer:    Raquel Sarna Senior RDCS Referring Phys: [place]  1. Left ventricular ejection fraction, by estimation, is 45 to 50%. The left ventricle has mildly decreased function. The left ventricle demonstrates global hypokinesis.  2. D-shaped interventricular septum suggestive of RV pressure/volume overload. Right ventricular systolic function is moderately reduced. The right ventricular size is moderately enlarged. Tricuspid regurgitation signal is inadequate for assessing PA pressure.  3. Left atrial size was mildly dilated.  4. Positive bubble study suggests atrial level shunt. 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 Right atrial size was mildly dilated.  5. Status post tricuspid valve repair. Mean gradient 2 mmHg with trivial regurgitation.  6. The aortic valve is tricuspid. Aortic valve regurgitation is not visualized.  7. Trivial mitral valve  regurgitation. No evidence of mitral stenosis.  8. The inferior vena cava is dilated in size with <50% respiratory variability, suggesting right atrial pressure of 15 mmHg.  9. Limited echo for bubble study FINDINGS  Left Ventricle: Left ventricular ejection fraction, by estimation, is 45 to 50%. The left ventricle has mildly decreased function. The left ventricle demonstrates global hypokinesis. Right Ventricle: D-shaped interventricular septum suggestive of RV pressure/volume overload. The right ventricular size is moderately enlarged. Right ventricular systolic function is moderately reduced. Tricuspid regurgitation signal is inadequate for assessing PA pressure. Left Atrium: Left atrial size was mildly dilated. Right Atrium: Positive bubble study suggests atrial level shunt. 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 Right atrial size was mildly dilated. Pericardium: Trivial pericardial effusion is present. Mitral Valve: There is mild calcification of the mitral valve leaflet(s). Mild mitral annular calcification. Trivial mitral valve regurgitation. No evidence of mitral valve stenosis. Tricuspid Valve: Status post tricuspid valve repair. Mean gradient 2 mmHg with trivial regurgitation. The tricuspid valve is has been repaired/replaced. Tricuspid valve regurgitation is trivial. Aortic Valve: The aortic valve is tricuspid. Aortic valve regurgitation is not visualized. Venous: The inferior vena cava is dilated in size with less than 50% respiratory variability, suggesting right atrial pressure  of 15 mmHg. Additional Comments: A device lead is visualized in the right ventricle. RIGHT VENTRICLE RV S prime:     7.43 cm/s TAPSE (M-mode): 0.8 cm TRICUSPID VALVE TV Peak grad:   4.5 mmHg TV Mean grad:   2.0 mmHg TV Vmax:        1.06 m/s TV Vmean:       74.6 cm/s TV VTI:         0.31 msec Dalton McleanMD Electronically signed by Franki Monte Signature Date/Time: 11/10/2021/3:06:10 PM     Final      Medications:     Scheduled Medications:  acetaminophen (TYLENOL) oral liquid 160 mg/5 mL  650 mg Per Tube Q6H   artificial tears  1 application. Both Eyes Q8H   aspirin EC  325 mg Oral Daily   Or   aspirin  324 mg Per Tube Daily   atorvastatin  10 mg Per Tube q AM   bisacodyl  10 mg Oral Daily   Or   bisacodyl  10 mg Rectal Daily   buPROPion  75 mg Per Tube BID   chlorhexidine gluconate (MEDLINE KIT)  15 mL Mouth Rinse BID   [START ON 11/13/2021] Chlorhexidine Gluconate Cloth  6 each Topical Daily   docusate  100 mg Per Tube BID   feeding supplement (PROSource TF)  45 mL Per Tube BID   insulin aspart  0-24 Units Subcutaneous Q4H   insulin detemir  8 Units Subcutaneous BID   mouth rinse  15 mL Mouth Rinse 10 times per day   metoCLOPramide (REGLAN) injection  5 mg Intravenous Q6H   pantoprazole sodium  40 mg Per Tube Daily   polyethylene glycol  17 g Per Tube Daily   sennosides  10 mL Per Tube BID   sodium chloride flush  10-40 mL Intracatheter Q12H   sodium chloride flush  3 mL Intravenous Q12H   venlafaxine  75 mg Per Tube BID WC    Infusions:   prismasol BGK 4/2.5 800 mL/hr at 11/11/21 0347    prismasol BGK 4/2.5 400 mL/hr at 11/11/21 0347   sodium chloride 20 mL/hr at 11/07/21 1800   sodium chloride     sodium chloride 5 mL/hr at 11/10/21 0800   sodium chloride     amiodarone 30 mg/hr (11/11/21 0800)   epinephrine 6 mcg/min (11/11/21 0800)   epoprostenol (VELETRI) for inhalation 1.57m/50mL (30,000 ng/mL) 50 ng/kg/min (11/11/21 0745)   feeding supplement (VITAL AF 1.2 CAL) 1,000 mL (11/11/21 0330)   fentaNYL infusion INTRAVENOUS 200 mcg/hr (11/11/21 0800)   heparin 10,000 units/ 20 mL infusion syringe 500 Units/hr (11/10/21 2100)   lactated ringers 5 mL/hr at 11/11/21 0800   midazolam 5 mg/hr (11/11/21 0800)   norepinephrine (LEVOPHED) Adult infusion 10 mcg/min (11/11/21 0800)   piperacillin-tazobactam Stopped (11/11/21 0356)   prismasol BGK 4/2.5 1,500  mL/hr at 11/11/21 0755   sodium phosphate  Dextrose 5% IVPB     vancomycin Stopped (11/10/21 1827)   vasopressin 0.03 Units/min (11/11/21 0801)     PRN Medications: sodium chloride, Place/Maintain arterial line **AND** sodium chloride, fentaNYL, heparin, lip balm, metoprolol tartrate, midazolam, ondansetron (ZOFRAN) IV, rocuronium bromide, sodium chloride, sodium chloride flush, sodium chloride flush     Patient Profile    66y.o. female with h/o PAF with recent failed ablation S/p Watchman implant 08/21/21,  breast CA in 1998 s/p left breast surgery and s/p Adriamycin x 4 cycles, non-ischemic cardiomyopathy/chronic systolic CHF, LBBB, s/p BiV Medtronic ICD, HTN, OSA,  and CKD Stage IIIb. Transferred from OSH for acute GIB and pericardial effusion. Initial Echo here showed EF 35-40% RV severely enlarged w/ low normal systolic fx. Moderate posterior effusion, no tamponade. Severe TR noted. R/LCH showed normal cors, LVEF 40%, Equalized right and left-sided pressure with no evidence of LV-RV interaction and evidence of severe RV failure w/ severely reduced PAPi, 0.6, in setting of severe TR. Went to OR 2/28 for TVR. Off pump had VT/VF when PA catheter placed. Had cardiac massage. Cardioverted x2. Unsuccessful. Placed back on pump with restoration of SR. Started on IV amiodarone.   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 Acute on Chronic Systolic CHF w/ Post Cardiotomy Shock  - felt to have LBBB CM vs chemo induced (got adriamycin). 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 No effusion -Echo 02/21: EF 35-40%, RV severely enlarged with okay function, moderate MR, severe TR with flail  posterior leaflet -RHC 02/22: RA 17, PA 33/21 (25), PCWP 18, PAPi 0.6 (in setting of severe TR), PA sat 47%, 48% -S/p TVR 2/28. Intraop TEE EF 40-45%, RV mildly reduced.  VT arrest when coming off pump  - Mixed septic/cardiogenic shock post-operatively - Milrinone discontinued 3/2 w/ low SVR - Repeat limited echo 3/6 EF 45-50%, RV mod reduced, R>>L atrial level shunt  - Today remains on Epi 6, NE 10, VP 0.03. SVR improved but CI and PAPi lower today, 1.6 and 1.2 respectively. Further titrate Epi to help w/ output/RV   - On Broad spectrum vanc/Zosyn. Pan-cultures NGTD.  - Still markedly volume overloaded, 36 lb above pre-op wt. Continue CVVH, needs more aggressive  volume removal today  - Continue to follow hemodynamics off swan  3. Acute Hypoxic Respiratory Failure - Extubated 3/1 - Reintubated 3/2. CXR ? B/l PNA  - PCT 2.49>>3.66>>4.15 - WBC 24>>36>>34>39>31>>18.5>>20>>17K. Covering w/ empiric abx, Vanc + zosyn  - Cultures NGTD - CXR with bilateral diffuse infiltrates, suspect PNA + volume overload/pulmonary edema. FiO2 70%, needs fluid off.  - Fluid removal via CVVH.   4. Pericardial effusion: -Doubt from Watchman procedure in 12/22 -Effusion appears moderate in size on echo 02/21.  -[place] 02/22 with equalization of right and left-sided pressures with no evidence of LV-RV interaction -There was no evidence of tamponade. Suspect main issue is severe TR with markedly elevated RA pressures and inability to drain pericardium.  5. PAF/tachycardia - Unable to complete ablation 10/22 d/t pericardial effusion - S/p Watchman implant 08/21/21 - continue amio gtt 30 mg/hr while on inotropes/ pressors  - heparin through CVVHD circuit   6. AKI on CKD IIIb/IV/ ATN - Creatinine up to 2.5, oliguric nearing anuric. Suspect ATN.  - In setting of GI bleed and hypotension +/- cardiorenal  - Marked volume overload with no response to IV diuretics => now on CVVH. Nephrology following    6. GI  bleeding - BRBPR + melena 4-5 days prior to admission - Per her report CT at OSH with diverticulosis but no diverticulitis - 1 u PRBCs this admit -EGD/Colonoscopy- R colonic ischemic ulcers - suspect in the setting of low output hf., 2 polyps removed, diverticulosis. Polyps no malignancy   - On PPI  -  Follow H/Hg, hgb 9, transfuse < 8.   7. OSA - Unable to tolerate CPAP  8. Hypokalemia/ hypomagnesia - K /Mag stable.   9. Ileus - suspect related to mesenteric ischemia in setting of low arterial pressure and high venous outflow pressures from RV failure - No h/o ischemic colitis symptoms - Has CT abdomen from OSH (non contrast) no documentation of ab arterial calcification - Improvintg w/ Reglan, neostigmine. BM on 3/5. Now off Neostigmine.   10. VT/VF -Intraoperative. Placed on amio drip.  -stable, no recurrence  -continue amio gtt -keep K >4.0 and Mg >2.0  11. 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  Remains intubated/sedated. On multiple pressors and inhaled epo.   Co-ox ok. Thermo CI low.   Remains on CVVHD pulling well.  General: Intubated/sedated.  HEENT: normal + ETT. Cor-trak Neck: supple RIJ swan Carotids 2+ bilat; no bruits. No lymphadenopathy or thryomegaly appreciated. Cor: Sternal dressing ok. Regular tachy  Lungs: coarse Abdomen: obese soft, nontender, nondistended. Hypoactive bowel sounds. Extremities: no cyanosis, clubbing, rash, 3+ edema Neuro: intubated sedated  Remains critically ill. Hemodynamics marginal but pressor requirements improving. Still about 33 pounds up.   Continue pressors and  CVVHD.Continue epo. Hopefully can avoid trach but suspect she may need. D/w CCM.   CRITICAL CARE Performed by: Glori Bickers  Total critical care time: 45 minutes  Critical care time was exclusive of separately billable procedures and treating other patients.  Critical care was necessary to treat or prevent imminent or life-threatening deterioration.  Critical care was time spent personally by me (independent of midlevel providers or residents) on the following activities: development of treatment plan with patient and/or surrogate as well as nursing, discussions with consultants, evaluation of patient's response to treatment, examination of patient, obtaining history from patient or surrogate, ordering and performing treatments and interventions, ordering and review of laboratory studies, ordering and review of radiographic studies, pulse oximetry and re-evaluation of patient's condition.  Glori Bickers, MD  12:53 PM

## 2021-11-11 NOTE — Progress Notes (Signed)
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  Placed on CRRT for volume control.  Hemodynamics stable on epinephrine, norepinephrine and vasopressin.  Amiodarone drip for atrial fibrillation  Blood pressure 80/60 pulse 80 temperature 98 O2 sats 100% FiO2 6%  Negative fluid balance 100 cc an hour  Sodium 137 potassium 4.2 ionized calcium 1.11   Objective:  Vital signs in last 24 hours:  Temp:  [97.3 F (36.3 C)-98.6 F (37 C)] 98.1 F (36.7 C) (03/07 0700) Pulse Rate:  [80-82] 80 (03/07 0700) Resp:  [20-29] 28 (03/07 0700) BP: (79-101)/(21-62) 80/62 (03/07 0700) SpO2:  [82 %-100 %] 100 % (03/07 0700) Arterial Line BP: (85-114)/(44-62) 101/52 (03/07 0700) FiO2 (%):  [70 %-100 %] 70 % (03/07 0403) Weight:  [101.5 kg] 101.5 kg (03/07 0400)  Weight change: -3.2 kg Filed Weights   11/09/21 0500 11/10/21 0500 11/11/21 0400  Weight: 107.5 kg 104.7 kg 101.5 kg    Intake/Output: I/O last 3 completed shifts: In: 5822.1 [I.V.:2827.4; Other:20; NG/GT:1960; IV Piggyback:1014.7] Out: 03704 [Urine:30; UGQBV:69450; Chest Tube:80]   Intake/Output this shift:  No intake/output data recorded.  CVS- RRR RS- CTA ABD- BS present soft non-distended EXT- no edema   Basic Metabolic Panel: Recent Labs  Lab 11/07/21 0305 11/07/21 0524 11/08/21 0422 11/08/21 0740 11/09/21 0408 11/09/21 0551 11/09/21 1600 11/10/21 0431 11/10/21 1001 11/10/21 1555 11/10/21 1613 11/11/21 0103 11/11/21 0312 11/11/21 0403  NA 129*   < > 131*   < > 135   < > 135 136   < > 133* 135 136 132* 137  K 3.9   < > 3.8   < > 3.7   < > 3.4* 4.2   < > 4.1 4.1 4.5 4.5 4.2  CL 97*  --  97*   < > 101  --  102 102  --  100  --   --  101  --   CO2 20*  --  18*   < > 23  --  23 24  --  24  --   --  22  --   GLUCOSE 89  --  146*   < > 201*  --  171* 153*  --  195*   --   --  198*  --   BUN 25*  --  33*   < > 21  --  17 16  --  18  --   --  18  --   CREATININE 1.93*  --  2.51*   < > 1.51*  --  1.09* 0.94  --  0.86  --   --  0.82  --   CALCIUM 7.9*  --  7.8*   < > 7.5*  --  7.4* 7.6*  --  7.4*  --   --  7.6*  --   MG 2.4  --  2.2  --  2.4  --   --  2.2  --   --   --   --  2.4  --   PHOS 5.0*  --  5.4*   < > 2.4*  --  <1.0* 2.1*  --  1.5*  --   --  1.4*  --    < > = values in this interval not displayed.     Liver Function Tests: Recent Labs  Lab 11/07/21 0305 11/08/21 0422 11/08/21 1533 11/09/21 0408  11/09/21 1600 11/10/21 0431 11/10/21 1555 11/11/21 0312  AST 98* 87*  --  61*  --  53*  --  61*  ALT 9 8  --  8  --  8  --  12  ALKPHOS 143* 192*  --  236*  --  335*  --  429*  BILITOT 0.8 1.2  --  1.0  --  1.2  --  1.2  PROT 4.9* 4.8*  --  5.0*  --  5.2*  --  5.7*  ALBUMIN 2.1* 1.8*   < > 1.7* 1.7* 1.6* 1.7* 1.7*   < > = values in this interval not displayed.    No results for input(s): LIPASE, AMYLASE in the last 168 hours. Recent Labs  Lab 11/11/21 0312  AMMONIA 62*    CBC: Recent Labs  Lab 11/07/21 0305 11/07/21 0524 11/08/21 0422 11/08/21 0740 11/09/21 0408 11/09/21 0551 11/10/21 0431 11/10/21 1001 11/10/21 1613 11/11/21 0103 11/11/21 0312 11/11/21 0403  WBC 31.6*  --  18.5*  --  19.9*  --  17.7*  --   --   --  20.7*  --   NEUTROABS  --   --   --   --   --   --   --   --   --   --  17.2*  --   HGB 9.7*   < > 8.8*   < > 9.0*   < > 8.9* 9.9* 10.2* 10.5* 9.0* 10.2*  HCT 30.6*   < > 28.7*   < > 29.1*   < > 28.0* 29.0* 30.0* 31.0* 29.6* 30.0*  MCV 90.3  --  92.9  --  90.7  --  89.5  --   --   --  91.6  --   PLT 238  --  159  --  140*  --  120*  --   --   --  161  --    < > = values in this interval not displayed.     Cardiac Enzymes: No results for input(s): CKTOTAL, CKMB, CKMBINDEX, TROPONINI in the last 168 hours.  BNP: Invalid input(s): POCBNP  CBG: Recent Labs  Lab 11/10/21 1107 11/10/21 1555 11/10/21 1930  11/10/21 2346 11/11/21 0320  GLUCAP 162* 197* 193* 143* 190*     Microbiology: Results for orders placed or performed during the hospital encounter of 10/10/2021  MRSA Next Gen by PCR, Nasal     Status: None   Collection Time: 10/17/2021  8:41 PM   Specimen: Nasal Mucosa; Nasal Swab  Result Value Ref Range Status   MRSA by PCR Next Gen NOT DETECTED NOT DETECTED Final    Comment: (NOTE) The GeneXpert MRSA Assay (FDA approved for NASAL specimens only), is one component of a comprehensive MRSA colonization surveillance program. It is not intended to diagnose MRSA infection nor to guide or monitor treatment for MRSA infections. Test performance is not FDA approved in patients less than 66 years old. Performed at [facility], [place] [address], [place], [place] [address]   Resp Panel by RT-PCR (Flu A&B, Covid) Nasopharyngeal Swab     Status: None   Collection Time: 10/24/2021  9:13 PM   Specimen: Nasopharyngeal Swab; Nasopharyngeal(NP) swabs in vial transport medium  Result Value Ref Range Status   SARS Coronavirus 2 by RT PCR NEGATIVE NEGATIVE Final    Comment: (NOTE) SARS-CoV-2 target nucleic acids are NOT DETECTED.  The SARS-CoV-2 RNA is generally detectable in upper respiratory specimens during  the acute phase of infection. The lowest concentration of SARS-CoV-2 viral copies this assay can detect is 138 copies/mL. A negative result does not preclude SARS-Cov-2 infection and should not be used as the sole basis for treatment or other patient management decisions. A negative result may occur with  improper specimen collection/handling, submission of specimen other than nasopharyngeal swab, presence of viral mutation(s) within the areas targeted by this assay, and inadequate number of viral copies(<138 copies/mL). A negative result must be combined with clinical observations, patient history, and epidemiological information. The expected result is Negative.  Fact Sheet for  Patients:  EntrepreneurPulse.com.au  Fact Sheet for Healthcare Providers:  IncredibleEmployment.be  This test is no t yet approved or cleared by the [place] FDA and  has been authorized for detection and/or diagnosis of SARS-CoV-2 by FDA under an Emergency Use Authorization (EUA). This EUA will remain  in effect (meaning this test can be used) for the duration of the COVID-19 declaration under Section 564(b)(1) of the Act, 21 U.S.C.section 360bbb-3(b)(1), unless the authorization is terminated  or revoked sooner.       Influenza A by PCR NEGATIVE NEGATIVE Final   Influenza B by PCR NEGATIVE NEGATIVE Final    Comment: (NOTE) The Xpert Xpress SARS-CoV-2/FLU/RSV plus assay is intended as an aid in the diagnosis of influenza from Nasopharyngeal swab specimens and should not be used as a sole basis for treatment. Nasal washings and aspirates are unacceptable for Xpert Xpress SARS-CoV-2/FLU/RSV testing.  Fact Sheet for Patients: EntrepreneurPulse.com.au  Fact Sheet for Healthcare Providers: IncredibleEmployment.be  This test is not yet approved or cleared by the [place] FDA and has been authorized for detection and/or diagnosis of SARS-CoV-2 by FDA under an Emergency Use Authorization (EUA). This EUA will remain in effect (meaning this test can be used) for the duration of the COVID-19 declaration under Section 564(b)(1) of the Act, 21 U.S.C. section 360bbb-3(b)(1), unless the authorization is terminated or revoked.  Performed at [facility], [place] [address] [place], [place] [address]   Culture, blood (routine x 2)     Status: None   Collection Time: 10/28/21  4:48 PM   Specimen: BLOOD  Result Value Ref Range Status   Specimen Description BLOOD SHOULDER RIGHT  Final   Special Requests   Final    BOTTLES DRAWN AEROBIC AND ANAEROBIC Blood Culture adequate volume   Culture   Final    NO  GROWTH 5 DAYS Performed at [facility], [place] [address] [place], [place] [address]    Report Status 11/02/2021 FINAL  Final  Culture, blood (routine x 2)     Status: None   Collection Time: 10/28/21  5:01 PM   Specimen: BLOOD  Result Value Ref Range Status   Specimen Description BLOOD RIGHT ANTECUBITAL  Final   Special Requests   Final    BOTTLES DRAWN AEROBIC AND ANAEROBIC Blood Culture results may not be optimal due to an inadequate volume of blood received in culture bottles   Culture   Final    NO GROWTH 5 DAYS Performed at [facility], [place] [address] [place], [place] [address]    Report Status 11/02/2021 FINAL  Final  Surgical pcr screen     Status: None   Collection Time: 11/01/21  4:00 PM   Specimen: Nasal Mucosa; Nasal Swab  Result Value Ref Range Status   MRSA, PCR NEGATIVE NEGATIVE Final   Staphylococcus aureus NEGATIVE NEGATIVE Final    Comment: (  NOTE) The Xpert SA Assay (FDA approved for NASAL specimens in patients 18 years of age and older), is one component of a comprehensive surveillance program. It is not intended to diagnose infection nor to guide or monitor treatment. Performed at [facility], [place] [address], [place], [place] [address]   MRSA Next Gen by PCR, Nasal     Status: None   Collection Time: 11/06/21  7:10 AM   Specimen: Nasal Mucosa; Nasal Swab  Result Value Ref Range Status   MRSA by PCR Next Gen NOT DETECTED NOT DETECTED Final    Comment: (NOTE) The GeneXpert MRSA Assay (FDA approved for NASAL specimens only), is one component of a comprehensive MRSA colonization surveillance program. It is not intended to diagnose MRSA infection nor to guide or monitor treatment for MRSA infections. Test performance is not FDA approved in patients less than 72 years old. Performed at [facility], [place] [address] [place], [place] [address]   Culture, Respiratory w Gram Stain     Status: None   Collection Time: 11/06/21 12:08 PM    Specimen: Tracheal Aspirate  Result Value Ref Range Status   Specimen Description TRACHEAL ASPIRATE  Final   Special Requests NONE  Final   Gram Stain   Final    ABUNDANT WBC PRESENT, PREDOMINANTLY PMN NO ORGANISMS SEEN    Culture   Final    RARE CANDIDA ALBICANS NO STAPHYLOCOCCUS AUREUS ISOLATED No Pseudomonas species isolated Performed at [facility], [place] [address] [place], [place] [address]    Report Status 11/09/2021 FINAL  Final  Culture, blood (Routine X 2) w Reflex to ID Panel     Status: None (Preliminary result)   Collection Time: 11/06/21  2:33 PM   Specimen: BLOOD  Result Value Ref Range Status   Specimen Description BLOOD RIGHT ANTECUBITAL  Final   Special Requests   Final    BOTTLES DRAWN AEROBIC AND ANAEROBIC Blood Culture adequate volume   Culture   Final    NO GROWTH 4 DAYS Performed at [facility], [place] [address], [place], [place] [address]    Report Status PENDING  Incomplete  Culture, blood (Routine X 2) w Reflex to ID Panel     Status: None (Preliminary result)   Collection Time: 11/06/21  2:40 PM   Specimen: BLOOD RIGHT WRIST  Result Value Ref Range Status   Specimen Description BLOOD RIGHT WRIST  Final   Special Requests   Final    BOTTLES DRAWN AEROBIC AND ANAEROBIC Blood Culture adequate volume   Culture   Final    NO GROWTH 4 DAYS Performed at [facility], [place] [address] [place], [place] [address]    Report Status PENDING  Incomplete  Urine Culture     Status: None   Collection Time: 11/06/21  3:40 PM   Specimen: Urine, Random  Result Value Ref Range Status   Specimen Description URINE, RANDOM  Final   Special Requests NONE  Final   Culture   Final    NO GROWTH Performed at [facility], [place] [address] [place],  [address]    Report Status 11/07/2021 FINAL  Final    Coagulation Studies: No results for input(s): LABPROT, INR in the last 72 hours.  Urinalysis: No results for input(s): COLORURINE,  LABSPEC, PHURINE, GLUCOSEU, HGBUR, BILIRUBINUR, KETONESUR, PROTEINUR, UROBILINOGEN, NITRITE, LEUKOCYTESUR in the last 72 hours.  Invalid input(s): APPERANCEUR    Imaging: DG CHEST  PORT 1 VIEW  Result Date: 11/11/2021 CLINICAL DATA:  Chest tube present. EXAM: PORTABLE CHEST 1 VIEW COMPARISON:  Yesterday FINDINGS: Endotracheal tube tip is at the carina. Chest tubes in place with trace right apical pneumothorax, stable. Feeding tube at least reaches the stomach. Swan-Ganz catheter from the right with tip at the lower right hilum. Stable cardiomegaly. Left atrial implant and prior median sternotomy. Haziness of the bilateral lower chest which is improved. Improved generalized haziness of the chest. IMPRESSION: 1. Lower endotracheal tube with tip at the carina. 2. Unchanged trace right apical pneumothorax. 3. Improved edema/pulmonary inflation. Electronically Signed   By: Jorje Guild M.D.   On: 11/11/2021 07:03   DG CHEST PORT 1 VIEW  Result Date: 11/10/2021 CLINICAL DATA:  Chest tube EXAM: PORTABLE CHEST 1 VIEW COMPARISON:  November 10, 2011 FINDINGS: Similar position of the endotracheal tube with tip in the distal thoracic esophagus. Swan-Ganz catheter with tip projecting over the right main pulmonary artery. Enteric feeding catheter coursing below the diaphragm with tip obscured by collimation. Right chest AICD/pacemaker with leads in unchanged position. Atrial appendage occlusion device. Stable enlarged cardiomediastinal silhouette. Bilateral thoracostomy tubes unchanged in position. Similar to slightly worsened bilateral, basilar predominant, diffuse interstitial thickening. No visible pleural effusion or pneumothorax. No acute osseous abnormality IMPRESSION: 1. Similar to slightly worsened bilateral, basilar predominant, diffuse interstitial thickening. 2. Stable lines and tubes as above. Similar position of endotracheal tube with tip in the distal thoracic esophagus. 3. Enlarged cardiac silhouette,  similar prior. Electronically Signed   By: Dahlia Bailiff M.D.   On: 11/10/2021 08:04   DG CHEST PORT 1 VIEW  Result Date: 11/09/2021 CLINICAL DATA:  Status post cardiac surgery, chest tube placement EXAM: PORTABLE CHEST 1 VIEW COMPARISON:  11/08/2021, 11/07/2021 FINDINGS: Endotracheal tube with the tip 2.5 cm above the carina. Nasogastric tube coursing below the diaphragm. Right jugular Swan-Ganz catheter insertion with the tip projecting over the right main pulmonary artery. Bilateral chest tubes. Bilateral diffuse interstitial thickening more prominent at the lung bases. No pleural effusion or pneumothorax. Stable cardiomediastinal silhouette. Prior median sternotomy. Three lead cardiac pacemaker. No pleural effusion or pneumothorax. Stable cardiomediastinal silhouette. No acute osseous abnormality. IMPRESSION: 1. Endotracheal tube with the tip 2.5 cm above the carina. 2. Nasogastric tube coursing below the diaphragm. 3. Right jugular Swan-Ganz catheter insertion with the tip projecting over the right main pulmonary artery. 4. Bilateral chest tubes. No pneumothorax. 5. Stable bilateral airspace disease likely reflecting a combination of interstitial edema and atelectasis. Electronically Signed   By: Kathreen Devoid M.D.   On: 11/09/2021 10:02   ECHOCARDIOGRAM LIMITED BUBBLE STUDY  Result Date: 11/10/2021    ECHOCARDIOGRAM LIMITED REPORT   Patient Name:   [name] Date of Exam: 11/10/2021 Medical Rec #:  [id number]            Height:       62.0 in Accession #:    [id number]           Weight:       230.8 lb Date of Birth:  02-Dec-1955            BSA:          2.032 m Patient Age:    66 years             BP:           107/55 mmHg Patient Gender: F  HR:           82 bpm. Exam Location:  Inpatient Procedure: Limited Echo, Color Doppler and Cardiac Doppler STAT ECHO Indications:    Respiratory Distress  History:        Patient has prior history of Echocardiogram examinations, most                  recent 10/22/2021. Arrythmias:Atrial Fibrillation; Risk                 Factors:Dyslipidemia and Sleep Apnea. S/p watchman on 08/21/21                 and TV repair on 11/03/2021.  Sonographer:    Raquel Sarna Senior RDCS Referring Phys: [place]  1. Left ventricular ejection fraction, by estimation, is 45 to 50%. The left ventricle has mildly decreased function. The left ventricle demonstrates global hypokinesis.  2. D-shaped interventricular septum suggestive of RV pressure/volume overload. Right ventricular systolic function is moderately reduced. The right ventricular size is moderately enlarged. Tricuspid regurgitation signal is inadequate for assessing PA pressure.  3. Left atrial size was mildly dilated.  4. Positive bubble study suggests atrial level shunt. 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 Right atrial size was mildly dilated.  5. Status post tricuspid valve repair. Mean gradient 2 mmHg with trivial regurgitation.  6. The aortic valve is tricuspid. Aortic valve regurgitation is not visualized.  7. Trivial mitral valve regurgitation. No evidence of mitral stenosis.  8. The inferior vena cava is dilated in size with <50% respiratory variability, suggesting right atrial pressure of 15 mmHg.  9. Limited echo for bubble study FINDINGS  Left Ventricle: Left ventricular ejection fraction, by estimation, is 45 to 50%. The left ventricle has mildly decreased function. The left ventricle demonstrates global hypokinesis. Right Ventricle: D-shaped interventricular septum suggestive of RV pressure/volume overload. The right ventricular size is moderately enlarged. Right ventricular systolic function is moderately reduced. Tricuspid regurgitation signal is inadequate for assessing PA pressure. Left Atrium: Left atrial size was mildly dilated. Right Atrium: Positive bubble study suggests atrial level shunt. 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 Right atrial size was mildly dilated. Pericardium: Trivial pericardial effusion is present. Mitral Valve: There is mild calcification of the mitral valve leaflet(s). Mild mitral annular calcification. Trivial mitral valve regurgitation. No evidence of mitral valve stenosis. Tricuspid Valve: Status post tricuspid valve repair. Mean gradient 2 mmHg with trivial regurgitation. The tricuspid valve is has been repaired/replaced. Tricuspid valve regurgitation is trivial. Aortic Valve: The aortic valve is tricuspid. Aortic valve regurgitation is not visualized. Venous: The inferior vena cava is dilated in size with less than 50% respiratory variability, suggesting right atrial pressure of 15 mmHg. Additional Comments: A device lead is visualized in the right ventricle. RIGHT VENTRICLE RV S prime:     7.43 cm/s TAPSE (M-mode): 0.8 cm TRICUSPID VALVE TV Peak grad:   4.5 mmHg TV Mean grad:   2.0 mmHg TV Vmax:        1.06 m/s TV Vmean:       74.6 cm/s TV VTI:         0.31 msec Dalton McleanMD Electronically signed by Franki Monte Signature Date/Time: 11/10/2021/3:06:10 PM    Final      Medications:     prismasol BGK 4/2.5 800 mL/hr at 11/11/21 0347    prismasol BGK 4/2.5 400 mL/hr at 11/11/21 0347   sodium chloride 20 mL/hr at 11/07/21  1800   sodium chloride     sodium chloride 5 mL/hr at 11/10/21 0800   sodium chloride     amiodarone 30 mg/hr (11/11/21 0700)   epinephrine 6 mcg/min (11/11/21 0700)   epoprostenol (VELETRI) for inhalation 1.67m/50mL (30,000 ng/mL) 50 ng/kg/min (11/10/21 2326)   feeding supplement (VITAL AF 1.2 CAL) 1,000 mL (11/11/21 0330)   fentaNYL infusion INTRAVENOUS 200 mcg/hr (11/11/21 0700)   heparin 10,000 units/ 20 mL infusion syringe 500 Units/hr (11/10/21 2100)   lactated ringers 5 mL/hr at 11/11/21 0700   midazolam 5 mg/hr (11/11/21 0700)   norepinephrine (LEVOPHED) Adult infusion 10 mcg/min (11/11/21 0700)   piperacillin-tazobactam Stopped (11/11/21 0356)    prismasol BGK 4/2.5 1,500 mL/hr at 11/11/21 0347   vancomycin Stopped (11/10/21 1827)   vasopressin 0.03 Units/min (11/11/21 0700)    acetaminophen (TYLENOL) oral liquid 160 mg/5 mL  650 mg Per Tube Q6H   artificial tears  1 application. Both Eyes Q8H   aspirin EC  325 mg Oral Daily   Or   aspirin  324 mg Per Tube Daily   atorvastatin  10 mg Per Tube q AM   bisacodyl  10 mg Oral Daily   Or   bisacodyl  10 mg Rectal Daily   buPROPion  75 mg Per Tube BID   chlorhexidine gluconate (MEDLINE KIT)  15 mL Mouth Rinse BID   [START ON 11/13/2021] Chlorhexidine Gluconate Cloth  6 each Topical Daily   docusate  100 mg Per Tube BID   feeding supplement (PROSource TF)  45 mL Per Tube BID   hydrocortisone sod succinate (SOLU-CORTEF) inj  100 mg Intravenous Q12H   insulin aspart  0-24 Units Subcutaneous Q4H   insulin detemir  8 Units Subcutaneous BID   mouth rinse  15 mL Mouth Rinse 10 times per day   metoCLOPramide (REGLAN) injection  5 mg Intravenous Q6H   pantoprazole sodium  40 mg Per Tube Daily   polyethylene glycol  17 g Per Tube Daily   sennosides  10 mL Per Tube BID   sodium chloride flush  10-40 mL Intracatheter Q12H   sodium chloride flush  3 mL Intravenous Q12H   venlafaxine  75 mg Per Tube BID WC   sodium chloride, Place/Maintain arterial line **AND** sodium chloride, fentaNYL, heparin, lip balm, metoprolol tartrate, midazolam, ondansetron (ZOFRAN) IV, rocuronium bromide, sodium chloride, sodium chloride flush, sodium chloride flush  Assessment/ Plan:  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  Started on CRRT 11/08/2021 with right femoral temporal dialysis catheter placed.  Continue on CRRT.  Patient required multiple pressors ANEMIA-no ESA's at this point. MBD-appears stable HTN/VOL-continue ultrafiltration.  100 cc an hour ACCESS-femoral dialysis catheter    LOS: 15 MSherril Croon_0 _1 :28 AM

## 2021-11-11 NOTE — Progress Notes (Signed)
Patient ID: [name], female   DOB: 23-Nov-1955, 66 y.o.   MRN: [id number] ? ?TCTS Evening Rounds: ? ?Hemodynamically stable on NE 12, epi 5, vaso 0.03 ? ?CI 2.8 with CVP 16, PA 47/26 ? ?Remains on CRRT removing 100cc/hr. -1100 cc so far today. ? ?Sats 96% on vent 70% FiO2 ?

## 2021-11-11 NOTE — Progress Notes (Signed)
7 Days Post-Op Procedure(s) (LRB): ?TRICUSPID VALVE REPAIR USING EDWARDS MC3 TRICUSPID ANNULOPLASTY RING SIZE 28MM (N/A) ?TRANSESOPHAGEAL ECHOCARDIOGRAM (TEE) (N/A) ?Subjective: ?Intubated, sedated ? ?Objective: ?Vital signs in last 24 hours: ?Temp:  [97.3 ?F (36.3 ?C)-98.4 ?F (36.9 ?C)] 98.1 ?F (36.7 ?C) (03/07 0700) ?Pulse Rate:  [80-82] 80 (03/07 0700) ?Cardiac Rhythm: Atrial paced (03/07 0400) ?Resp:  [20-29] 28 (03/07 0700) ?BP: (79-101)/(21-62) 80/62 (03/07 0700) ?SpO2:  [82 %-100 %] 100 % (03/07 0745) ?Arterial Line BP: (85-114)/(44-62) 101/52 (03/07 0700) ?FiO2 (%):  [70 %-100 %] 70 % (03/07 0745) ?Weight:  [101.5 kg] 101.5 kg (03/07 0400) ? ?Hemodynamic parameters for last 24 hours: ?PAP: (32-53)/(15-33) 42/25 ?CVP:  [2 mmHg-22 mmHg] 13 mmHg ?CO:  [2.8 L/min-4.7 L/min] 4.3 L/min ?CI:  [1.4 L/min/m2-2.4 L/min/m2] 2.2 L/min/m2 ? ?Intake/Output from previous day: ?03/06 0701 - 03/07 0700 ?In: 4176.1 [I.V.:1936; NG/GT:1820; IV Piggyback:400.1] ?Out: 6314 [Urine:15; Chest Tube:20] ?Intake/Output this shift: ?No intake/output data recorded. ? ?General appearance: sedated, intubated ?Neurologic: sedated ?Heart: regular rate and rhythm ?Lungs: coarse BS bilaterally ?Abdomen: distended ?Extremities: edema 4+ ? ?Lab Results: ?Recent Labs  ?  11/10/21 ?0431 11/10/21 ?1001 11/11/21 ?0312 11/11/21 ?0403  ?WBC 17.7*  --  20.7*  --   ?HGB 8.9*   < > 9.0* 10.2*  ?HCT 28.0*   < > 29.6* 30.0*  ?PLT 120*  --  161  --   ? < > = values in this interval not displayed.  ? ?BMET:  ?Recent Labs  ?  11/10/21 ?1555 11/10/21 ?1613 11/11/21 ?9702 11/11/21 ?0403  ?NA 133*   < > 132* 137  ?K 4.1   < > 4.5 4.2  ?CL 100  --  101  --   ?CO2 24  --  22  --   ?GLUCOSE 195*  --  198*  --   ?BUN 18  --  18  --   ?CREATININE 0.86  --  0.82  --   ?CALCIUM 7.4*  --  7.6*  --   ? < > = values in this interval not displayed.  ?  ?PT/INR: No results for input(s): LABPROT, INR in the last 72 hours. ?ABG ?   ?Component Value Date/Time  ? PHART 7.310  (L) 11/11/2021 0403  ? HCO3 23.9 11/11/2021 0403  ? TCO2 25 11/11/2021 0403  ? ACIDBASEDEF 3.0 (H) 11/11/2021 0403  ? O2SAT 98 11/11/2021 0403  ? ?CBG (last 3)  ?Recent Labs  ?  11/10/21 ?2346 11/11/21 ?0320 11/11/21 ?0749  ?GLUCAP 143* 190* 205*  ? ? ?Assessment/Plan: ?S/P Procedure(s) (LRB): ?TRICUSPID VALVE REPAIR USING EDWARDS MC3 TRICUSPID ANNULOPLASTY RING SIZE 28MM (N/A) ?TRANSESOPHAGEAL ECHOCARDIOGRAM (TEE) (N/A) ?Remains critically ill ?NEURO- sedated ?CV- atrial paced, CI variable overnight ? On epi, norepi, vasopressin- co-ox 56 this AM ? On amiodarone drip ? Echo showed R to L atrial level shunt ?RESP- still requiring high levels of FiO2 and PEEP ? O2 down to 70, PEEP at 16 ? High PEEP limiting venous return/ CO- balance with volume removal ?RENAL- on CRRT, oliguric ? 2L negative yesterday but still 35 pounds above preop ?ENDO- CBG moderately elevated ?GI- on TF ?On heparin through CRRT circuit ?ID- WBC up this AM from 17 to 20K ? On Vanco and Zosyn ? ? LOS: 15 days  ? ? ?[name] ?11/11/2021 ? ? ?

## 2021-11-11 NOTE — Progress Notes (Signed)
? ?NAME:  [name], MRN:  [id number], DOB:  [birth year], LOS: 41 ?ADMISSION DATE:  10/08/2021, CONSULTATION DATE:  11/06/21 ?REFERRING MD:  Nils Pyle, CHIEF COMPLAINT:  hypoxia  ? ?History of Present Illness:  ?[name] is a 66 year old woman with a history of HFrEF and paroxysmal atrial fibrillation who was transferred to [facility] with GI bleed and pericardial effusion.  She had recently undergone watchman insertion in January 2023.  She underwent EGD which did not explain bleeding.  Colonoscopy demonstrated sessile polyps, which were removed in the ascending colon and areas of ischemic colonic ulcers.  There was moderate sigmoid diverticulosis.  Heart catheterization demonstrated normal coronary arteries with equalization of left and right-sided heart pressures, severely reduced PAPi due to severe tricuspid regurgitation.  Swan-Ganz catheter was left in place to assist with weaning inotropic support.  Echocardiograms have previously demonstrated reduced RV function and dilation. On 2/28 she underwent Tricuspid repair and ring annuplasty. Post-op day 1 she was extubated but developed rapidly worsening respiratory failure overnight on 3/2 requiring re-intubation by anesthesia. CXR post-intubation had bilateral infiltrates. ? 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 ?Significant Hospital Events: ?Including procedures, antibiotic start and stop dates in addition to other pertinent events   ? ?2/20 admitted for pericardial effusion without tamponade ?2/22 L&R heart cath ?2/24 EGD & colonoscopy ?2/28 tricuspid valve repair; Vt arrest coming off bypass, back on bypass to stabilize ?3/1 extubated post op, reintubated overnight for hypoxia ?3/2 PCCM consulted for vent management.  Started on vancomycin and Zosyn for presumed aspiration pneumonia ?3/3 remains in a mixed picture of cardiogenic  and septic shock.  ?3/4 started on CRRT ?3/5>> improving pressor needs, stubborn O2 needs ? ?Interim History / Subjective:  ?Variable pressor and O2 needs. ?Working on weaning PEEP. ?Remains on inhaled epo. ? ?Objective   ?Blood pressure (!) 71/57, pulse 80, temperature 98.2 ?F (36.8 ?C), resp. rate (!) 25, height '5\' 2"'$  (1.575 m), weight 101.5 kg, SpO2 97 %. ?PAP: (32-53)/(16-33) 36/21 ?CVP:  [2 mmHg-22 mmHg] 12 mmHg ?PCWP:  [24 mmHg] 24 mmHg ?CO:  [2.8 L/min-4.6 L/min] 4.6 L/min ?CI:  [1.4 L/min/m2-2.4 L/min/m2] 2.4 L/min/m2  ?Vent Mode: PRVC ?FiO2 (%):  [70 %-100 %] 70 % ?Set Rate:  [28 bmp] 28 bmp ?Vt Set:  [400 mL] 400 mL ?PEEP:  [16 cmH20-18 cmH20] 16 cmH20 ?Plateau Pressure:  [28 cmH20-39 cmH20] 36 cmH20  ? ?Intake/Output Summary (Last 24 hours) at 11/11/2021 1103 ?Last data filed at 11/11/2021 1000 ?Gross per 24 hour  ?Intake 4048.06 ml  ?Output 6657 ml  ?Net -2608.94 ml  ? ? ?Filed Weights  ? 11/09/21 0500 11/10/21 0500 11/11/21 0400  ?Weight: 107.5 kg 104.7 kg 101.5 kg  ? ? ?Examination: ?No distress ?Still marked anasarca ?R toes are a bit dusky but pulses dopplerable ?Sternotomy site looks okay ?RASS -5 ?Lungs with diminished sounds bases ? ?ABG remains mildly acidotic despite high MV and CRRT ? ?Phos low being  ?Sugars and WBC up, will dc stress steroids and see how settles out ? ?Resolved Hospital Problem list   ? ? 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  TVR complicated by VT arrest coming off pump, brief. ?Fluid overloaded  state of heart. ?Renal failure with oliguria- septic ATN and cardiorenal now on CRRT ?Postoperative mixed cardiogenic and septic shock- improved but still severe ?Pericardial effusion moderate without tamponade.  Seems better  on more recent echo. ?Chronic AF, hx NSVT, AICD in place ?Ischemic colitis- with ABLA, was resolved,.  Had nonbloody BM 3/4. ?HLD, GERD, OSA ontolerant to BIPAP ?Hx breast ca 1998 with exposure to adriamycin ?Postop ileus- improved with reglan and subcutaneous neostigmine ?DM with hyperglycemia, stress steroids not helping ? ?- CRRT with pull per TCTS and CHF teams ?- Continue vanc/zosyn, duration TBD ?- Titrate pressors to MAP 65; stop stress steroids ?- Continue bowel regimen as ordered ?- Lung protective ventilation on ARDSnet protocol, allow permissive hypercapnea, limit driving pressures, continue veletri with sedation titrated to vent synchrony; once FiO2 and PEEP are more reasonable will consider veletri wean ?- Continue aggressive care, family aware of guarded prognosis ? ?Best Practice (right click and "Reselect all SmartList Selections" daily)  ? ?Diet/type: tubefeeds ?DVT prophylaxis: SCD ?GI prophylaxis: PPI ?Lines: Central line and Arterial Line ?Foley:  Yes, and it is still needed ?Code Status:  full code ?Last date of multidisciplinary goals of care discussion [3/6] ? ? ?Patient critically ill due to multiorgan failure ?Interventions to address this today vent/CRRT/pressor titration ?Risk of deterioration without these interventions is high ? ?I personally spent 36 minutes providing critical care not including any separately billable procedures ? ?Erskine Emery MD ?[facility] Critical [facility] ? ?Prefer epic messenger for cross cover needs ?If after hours, please call E-link ? ? ?

## 2021-11-12 ENCOUNTER — Inpatient Hospital Stay (HOSPITAL_COMMUNITY): Payer: Medicare PPO

## 2021-11-12 DIAGNOSIS — I3139 Other pericardial effusion (noninflammatory): Secondary | ICD-10-CM | POA: Diagnosis not present

## 2021-11-12 LAB — RENAL FUNCTION PANEL
Albumin: 2.8 g/dL — ABNORMAL LOW (ref 3.5–5.0)
Albumin: 3 g/dL — ABNORMAL LOW (ref 3.5–5.0)
Anion gap: 10 (ref 5–15)
Anion gap: 9 (ref 5–15)
BUN: 18 mg/dL (ref 8–23)
BUN: 16 mg/dL (ref 8–23)
CO2: 23 mmol/L (ref 22–32)
CO2: 25 mmol/L (ref 22–32)
Calcium: 7.9 mg/dL — ABNORMAL LOW (ref 8.9–10.3)
Calcium: 8 mg/dL — ABNORMAL LOW (ref 8.9–10.3)
Chloride: 100 mmol/L (ref 98–111)
Chloride: 101 mmol/L (ref 98–111)
Creatinine, Ser: 0.75 mg/dL (ref 0.44–1.00)
Creatinine, Ser: 0.81 mg/dL (ref 0.44–1.00)
GFR, Estimated: >60 mL/min (ref >60)
GFR, Estimated: >60 mL/min (ref >60)
Glucose, Bld: 148 mg/dL — ABNORMAL HIGH (ref 70–99)
Glucose, Bld: 152 mg/dL — ABNORMAL HIGH (ref 70–99)
Phosphorus: 1.1 mg/dL — ABNORMAL LOW (ref 2.5–4.6)
Phosphorus: 2.7 mg/dL (ref 2.5–4.6)
Potassium: 3.9 mmol/L (ref 3.5–5.1)
Potassium: 4.6 mmol/L (ref 3.5–5.1)
Sodium: 133 mmol/L — ABNORMAL LOW (ref 135–145)
Sodium: 135 mmol/L (ref 135–145)

## 2021-11-12 LAB — POCT I-STAT EG7
Acid-base deficit: 1 mmol/L (ref 0.0–2.0)
Acid-base deficit: 2 mmol/L (ref 0.0–2.0)
Acid-base deficit: 2 mmol/L (ref 0.0–2.0)
Acid-base deficit: 5 mmol/L — ABNORMAL HIGH (ref 0.0–2.0)
Acid-base deficit: 2 mmol/L (ref 0.0–2.0)
Bicarbonate: 25.7 mmol/L (ref 20.0–28.0)
Bicarbonate: 26.7 mmol/L (ref 20.0–28.0)
Bicarbonate: 22.9 mmol/L (ref 20.0–28.0)
Bicarbonate: 26.5 mmol/L (ref 20.0–28.0)
Bicarbonate: 26.1 mmol/L (ref 20.0–28.0)
Calcium, Ion: 1.13 mmol/L — ABNORMAL LOW (ref 1.15–1.40)
Calcium, Ion: 1.16 mmol/L (ref 1.15–1.40)
Calcium, Ion: 0.8 mmol/L — CL (ref 1.15–1.40)
Calcium, Ion: 1.15 mmol/L (ref 1.15–1.40)
Calcium, Ion: 1.1 mmol/L — ABNORMAL LOW (ref 1.15–1.40)
HCT: 27 % — ABNORMAL LOW (ref 36.0–46.0)
HCT: 27 % — ABNORMAL LOW (ref 36.0–46.0)
HCT: 25 % — ABNORMAL LOW (ref 36.0–46.0)
HCT: 27 % — ABNORMAL LOW (ref 36.0–46.0)
HCT: 26 % — ABNORMAL LOW (ref 36.0–46.0)
Hemoglobin: 9.2 g/dL — ABNORMAL LOW (ref 12.0–15.0)
Hemoglobin: 9.2 g/dL — ABNORMAL LOW (ref 12.0–15.0)
Hemoglobin: 8.5 g/dL — ABNORMAL LOW (ref 12.0–15.0)
Hemoglobin: 9.2 g/dL — ABNORMAL LOW (ref 12.0–15.0)
Hemoglobin: 8.8 g/dL — ABNORMAL LOW (ref 12.0–15.0)
O2 Saturation: 34 %
O2 Saturation: 64 %
O2 Saturation: 37 %
O2 Saturation: 57 %
O2 Saturation: 57 %
Patient temperature: 36.8
Patient temperature: 36.8
Patient temperature: 37
Patient temperature: 37
Patient temperature: 37
Potassium: 4.3 mmol/L (ref 3.5–5.1)
Potassium: 4.4 mmol/L (ref 3.5–5.1)
Potassium: 3.8 mmol/L (ref 3.5–5.1)
Potassium: 4.2 mmol/L (ref 3.5–5.1)
Potassium: 4.4 mmol/L (ref 3.5–5.1)
Sodium: 136 mmol/L (ref 135–145)
Sodium: 137 mmol/L (ref 135–145)
Sodium: 138 mmol/L (ref 135–145)
Sodium: 139 mmol/L (ref 135–145)
Sodium: 138 mmol/L (ref 135–145)
TCO2: 27 mmol/L (ref 22–32)
TCO2: 28 mmol/L (ref 22–32)
TCO2: 25 mmol/L (ref 22–32)
TCO2: 28 mmol/L (ref 22–32)
TCO2: 28 mmol/L (ref 22–32)
pCO2, Ven: 55.3 mmHg (ref 44–60)
pCO2, Ven: 58.7 mmHg (ref 44–60)
pCO2, Ven: 59.5 mmHg (ref 44–60)
pCO2, Ven: 65.3 mmHg — ABNORMAL HIGH (ref 44–60)
pCO2, Ven: 59.7 mmHg (ref 44–60)
pH, Ven: 7.265 (ref 7.25–7.43)
pH, Ven: 7.273 (ref 7.25–7.43)
pH, Ven: 7.193 — CL (ref 7.25–7.43)
pH, Ven: 7.216 — ABNORMAL LOW (ref 7.25–7.43)
pH, Ven: 7.248 — ABNORMAL LOW (ref 7.25–7.43)
pO2, Ven: 24 mmHg — CL (ref 32–45)
pO2, Ven: 38 mmHg (ref 32–45)
pO2, Ven: 27 mmHg — CL (ref 32–45)
pO2, Ven: 37 mmHg (ref 32–45)
pO2, Ven: 35 mmHg (ref 32–45)

## 2021-11-12 LAB — CBC
HCT: 26.5 % — ABNORMAL LOW (ref 36.0–46.0)
Hemoglobin: 7.7 g/dL — ABNORMAL LOW (ref 12.0–15.0)
MCH: 27.9 pg (ref 26.0–34.0)
MCHC: 29.1 g/dL — ABNORMAL LOW (ref 30.0–36.0)
MCV: 96 fL (ref 80.0–100.0)
Platelets: 152 10*3/uL (ref 150–400)
RBC: 2.76 MIL/uL — ABNORMAL LOW (ref 3.87–5.11)
RDW: 20.8 % — ABNORMAL HIGH (ref 11.5–15.5)
WBC: 16.1 10*3/uL — ABNORMAL HIGH (ref 4.0–10.5)
nRBC: 18.3 % — ABNORMAL HIGH (ref 0.0–0.2)

## 2021-11-12 LAB — MAGNESIUM: Magnesium: 2.4 mg/dL (ref 1.7–2.4)

## 2021-11-12 LAB — POCT I-STAT 7, (LYTES, BLD GAS, ICA,H+H)
Acid-base deficit: 2 mmol/L (ref 0.0–2.0)
Bicarbonate: 24.8 mmol/L (ref 20.0–28.0)
Calcium, Ion: 1.16 mmol/L (ref 1.15–1.40)
HCT: 27 % — ABNORMAL LOW (ref 36.0–46.0)
Hemoglobin: 9.2 g/dL — ABNORMAL LOW (ref 12.0–15.0)
O2 Saturation: 95 %
Patient temperature: 36.3
Potassium: 4.3 mmol/L (ref 3.5–5.1)
Sodium: 137 mmol/L (ref 135–145)
TCO2: 26 mmol/L (ref 22–32)
pCO2 arterial: 52.2 mmHg — ABNORMAL HIGH (ref 32–48)
pH, Arterial: 7.281 — ABNORMAL LOW (ref 7.35–7.45)
pO2, Arterial: 83 mmHg (ref 83–108)

## 2021-11-12 LAB — GLUCOSE, CAPILLARY
Glucose-Capillary: 152 mg/dL — ABNORMAL HIGH (ref 70–99)
Glucose-Capillary: 157 mg/dL — ABNORMAL HIGH (ref 70–99)
Glucose-Capillary: 170 mg/dL — ABNORMAL HIGH (ref 70–99)
Glucose-Capillary: 150 mg/dL — ABNORMAL HIGH (ref 70–99)
Glucose-Capillary: 189 mg/dL — ABNORMAL HIGH (ref 70–99)

## 2021-11-12 LAB — HEPARIN LEVEL (UNFRACTIONATED): Heparin Unfractionated: <0.1 IU/mL — ABNORMAL LOW (ref 0.30–0.70)

## 2021-11-12 LAB — COOXEMETRY PANEL
Carboxyhemoglobin: 1.9 % — ABNORMAL HIGH (ref 0.5–1.5)
Carboxyhemoglobin: 1.5 % (ref 0.5–1.5)
Methemoglobin: <0.7 % (ref 0.0–1.5)
Methemoglobin: 0.8 % (ref 0.0–1.5)
O2 Saturation: 58.7 %
O2 Saturation: 43.5 %
Total hemoglobin: 7.7 g/dL — ABNORMAL LOW (ref 12.0–16.0)
Total hemoglobin: 7.9 g/dL — ABNORMAL LOW (ref 12.0–16.0)

## 2021-11-12 LAB — APTT: aPTT: 36 seconds (ref 24–36)

## 2021-11-12 MED ORDER — SODIUM PHOSPHATES 45 MMOLE/15ML IV SOLN
45.0000 mmol | Freq: Once | INTRAVENOUS | Status: AC
  Administered 2021-11-12: 45 mmol via INTRAVENOUS
  Filled 2021-11-12: qty 15

## 2021-11-12 MED ORDER — SODIUM CHLORIDE 0.9 % IV SOLN: INTRAVENOUS | Status: DC | PRN

## 2021-11-12 MED ORDER — NEOSTIGMINE METHYLSULFATE 10 MG/10ML IV SOLN
0.2500 mg | Freq: Four times a day (QID) | INTRAVENOUS | Status: DC
  Administered 2021-11-12 – 2021-11-15 (×12): 0.25 mg via SUBCUTANEOUS
  Filled 2021-11-12 (×15): qty 0.25

## 2021-11-12 NOTE — Progress Notes (Signed)
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  Placed on CRRT for volume control.  Hemodynamics stable on epinephrine, norepinephrine and vasopressin.  Amiodarone drip for atrial fibrillation  Blood pressure 101/47 pulse 80 temperature 98.1 O2 sats 94% FiO2 90%  Negative fluid balance 100 cc an hour  Sodium 133 potassium 3.9 chloride 100 CO2 23 BUN 18 creatinine 0.75 glucose 148 calcium 7.9 phosphorus 1.1.  Hemoglobin 7.7   Objective:  Vital signs in last 24 hours:  Temp:  [97.9 F (36.6 C)-99.5 F (37.5 C)] 98.1 F (36.7 C) (03/08 0700) Pulse Rate:  [80-81] 80 (03/08 0700) Resp:  [13-31] 26 (03/08 0700) BP: (71-95)/(36-57) 95/39 (03/08 0401) SpO2:  [87 %-100 %] 95 % (03/08 0700) Arterial Line BP: (90-117)/(40-53) 101/47 (03/08 0700) FiO2 (%):  [70 %-100 %] 90 % (03/08 0401) Weight:  [99.8 kg] 99.8 kg (03/08 0400)  Weight change: -1.7 kg Filed Weights   11/10/21 0500 11/11/21 0400 11/12/21 0400  Weight: 104.7 kg 101.5 kg 99.8 kg    Intake/Output: I/O last 3 completed shifts: In: 6642.6 [I.V.:2831.4; Other:20; NG/GT:2700; IV Piggyback:1091.2] Out: 02233 [Other:10080]   Intake/Output this shift:  No intake/output data recorded.  Critically ill intubated sedated CVS- RRR ET tube and core track RS- CTA JVP elevated intubated ABD- BS present soft non-distended EXT- no edema   Basic Metabolic Panel: Recent Labs  Lab 11/08/21 0422 11/08/21 0740 11/09/21 0408 11/09/21 0551 11/10/21 0431 11/10/21 1001 11/10/21 1555 11/10/21 1613 11/11/21 0312 11/11/21 0403 11/11/21 1502 11/11/21 1554 11/11/21 1603 11/11/21 2141 11/12/21 0358  NA 131*   < > 135   < > 136   < > 133*   < > 132*   < > 138 136 137 138 133*  K 3.8   < > 3.7   < > 4.2   < > 4.1   < > 4.5   < > 3.9 4.1 4.0 3.9 3.9  CL 97*   < > 101   < > 102  --   100  --  101  --   --  102  --   --  100  CO2 18*   < > 23   < > 24  --  24  --  22  --   --  25  --   --  23  GLUCOSE 146*   < > 201*   < > 153*  --  195*  --  198*  --   --  183*  --   --  148*  BUN 33*   < > 21   < > 16  --  18  --  18  --   --  21  --   --  18  CREATININE 2.51*   < > 1.51*   < > 0.94  --  0.86  --  0.82  --   --  0.81  --   --  0.75  CALCIUM 7.8*   < > 7.5*   < > 7.6*  --  7.4*  --  7.6*  --   --  7.4*  --   --  7.9*  MG 2.2  --  2.4  --  2.2  --   --   --  2.4  --   --   --   --   --  2.4  PHOS 5.4*   < > 2.4*   < > 2.1*  --  1.5*  --  1.4*  --   --  2.3*  --   --  1.1*   < > = values in this interval not displayed.     Liver Function Tests: Recent Labs  Lab 11/07/21 0305 11/08/21 0422 11/08/21 1533 11/09/21 0408 11/09/21 1600 11/10/21 0431 11/10/21 1555 11/11/21 0312 11/11/21 1554 11/12/21 0358  AST 98* 87*  --  61*  --  53*  --  61*  --   --   ALT 9 8  --  8  --  8  --  12  --   --   ALKPHOS 143* 192*  --  236*  --  335*  --  429*  --   --   BILITOT 0.8 1.2  --  1.0  --  1.2  --  1.2  --   --   PROT 4.9* 4.8*  --  5.0*  --  5.2*  --  5.7*  --   --   ALBUMIN 2.1* 1.8*   < > 1.7*   < > 1.6* 1.7* 1.7* 2.3* 2.8*   < > = values in this interval not displayed.    No results for input(s): LIPASE, AMYLASE in the last 168 hours. Recent Labs  Lab 11/11/21 0312  AMMONIA 62*     CBC: Recent Labs  Lab 11/08/21 0422 11/08/21 0740 11/09/21 0408 11/09/21 0551 11/10/21 0431 11/10/21 1001 11/11/21 0312 11/11/21 0403 11/11/21 1131 11/11/21 1502 11/11/21 1603 11/11/21 2141 11/12/21 0358  WBC 18.5*  --  19.9*  --  17.7*  --  20.7*  --   --   --   --   --  16.1*  NEUTROABS  --   --   --   --   --   --  17.2*  --   --   --   --   --   --   HGB 8.8*   < > 9.0*   < > 8.9*   < > 9.0*   < > 10.2* 9.5* 9.9* 9.5* 7.7*  HCT 28.7*   < > 29.1*   < > 28.0*   < > 29.6*   < > 30.0* 28.0* 29.0* 28.0* 26.5*  MCV 92.9  --  90.7  --  89.5  --  91.6  --   --   --   --    --  96.0  PLT 159  --  140*  --  120*  --  161  --   --   --   --   --  152   < > = values in this interval not displayed.     Cardiac Enzymes: No results for input(s): CKTOTAL, CKMB, CKMBINDEX, TROPONINI in the last 168 hours.  BNP: Invalid input(s): POCBNP  CBG: Recent Labs  Lab 11/11/21 1129 11/11/21 1601 11/11/21 1919 11/11/21 2351 11/12/21 0358  GLUCAP 164* 172* 134* 134* 152*     Microbiology: Results for orders placed or performed during the hospital encounter of 10/09/2021  MRSA Next Gen by PCR, Nasal     Status: None   Collection Time: 10/19/2021  8:41 PM   Specimen: Nasal Mucosa; Nasal Swab  Result Value Ref Range Status   MRSA by PCR Next Gen NOT DETECTED NOT DETECTED Final    Comment: (NOTE) The GeneXpert MRSA Assay (FDA approved for NASAL specimens only), is one component of  a comprehensive MRSA colonization surveillance program. It is not intended to diagnose MRSA infection nor to guide or monitor treatment for MRSA infections. Test performance is not FDA approved in patients less than 25 years old. Performed at [facility], [place] [address] [place], [place] [address]   Resp Panel by RT-PCR (Flu A&B, Covid) Nasopharyngeal Swab     Status: None   Collection Time: 10/17/2021  9:13 PM   Specimen: Nasopharyngeal Swab; Nasopharyngeal(NP) swabs in vial transport medium  Result Value Ref Range Status   SARS Coronavirus 2 by RT PCR NEGATIVE NEGATIVE Final    Comment: (NOTE) SARS-CoV-2 target nucleic acids are NOT DETECTED.  The SARS-CoV-2 RNA is generally detectable in upper respiratory specimens during the acute phase of infection. The lowest concentration of SARS-CoV-2 viral copies this assay can detect is 138 copies/mL. A negative result does not preclude SARS-Cov-2 infection and should not be used as the sole basis for treatment or other patient management decisions. A negative result may occur with  improper specimen collection/handling, submission of  specimen other than nasopharyngeal swab, presence of viral mutation(s) within the areas targeted by this assay, and inadequate number of viral copies(<138 copies/mL). A negative result must be combined with clinical observations, patient history, and epidemiological information. The expected result is Negative.  Fact Sheet for Patients:  EntrepreneurPulse.com.au  Fact Sheet for Healthcare Providers:  IncredibleEmployment.be  This test is no t yet approved or cleared by the [place] FDA and  has been authorized for detection and/or diagnosis of SARS-CoV-2 by FDA under an Emergency Use Authorization (EUA). This EUA will remain  in effect (meaning this test can be used) for the duration of the COVID-19 declaration under Section 564(b)(1) of the Act, 21 U.S.C.section 360bbb-3(b)(1), unless the authorization is terminated  or revoked sooner.       Influenza A by PCR NEGATIVE NEGATIVE Final   Influenza B by PCR NEGATIVE NEGATIVE Final    Comment: (NOTE) The Xpert Xpress SARS-CoV-2/FLU/RSV plus assay is intended as an aid in the diagnosis of influenza from Nasopharyngeal swab specimens and should not be used as a sole basis for treatment. Nasal washings and aspirates are unacceptable for Xpert Xpress SARS-CoV-2/FLU/RSV testing.  Fact Sheet for Patients: EntrepreneurPulse.com.au  Fact Sheet for Healthcare Providers: IncredibleEmployment.be  This test is not yet approved or cleared by the [place] FDA and has been authorized for detection and/or diagnosis of SARS-CoV-2 by FDA under an Emergency Use Authorization (EUA). This EUA will remain in effect (meaning this test can be used) for the duration of the COVID-19 declaration under Section 564(b)(1) of the Act, 21 U.S.C. section 360bbb-3(b)(1), unless the authorization is terminated or revoked.  Performed at [facility], [place] [address]  [place], [place] [address]   Culture, blood (routine x 2)     Status: None   Collection Time: 10/28/21  4:48 PM   Specimen: BLOOD  Result Value Ref Range Status   Specimen Description BLOOD SHOULDER RIGHT  Final   Special Requests   Final    BOTTLES DRAWN AEROBIC AND ANAEROBIC Blood Culture adequate volume   Culture   Final    NO GROWTH 5 DAYS Performed at [facility], [place] [address], [place], [place] [address]    Report Status 11/02/2021 FINAL  Final  Culture, blood (routine x 2)     Status: None   Collection Time: 10/28/21  5:01 PM   Specimen: BLOOD  Result Value Ref Range Status  Specimen Description BLOOD RIGHT ANTECUBITAL  Final   Special Requests   Final    BOTTLES DRAWN AEROBIC AND ANAEROBIC Blood Culture results may not be optimal due to an inadequate volume of blood received in culture bottles   Culture   Final    NO GROWTH 5 DAYS Performed at [facility], [place] [address], [place], [place] [address]    Report Status 11/02/2021 FINAL  Final  Surgical pcr screen     Status: None   Collection Time: 11/01/21  4:00 PM   Specimen: Nasal Mucosa; Nasal Swab  Result Value Ref Range Status   MRSA, PCR NEGATIVE NEGATIVE Final   Staphylococcus aureus NEGATIVE NEGATIVE Final    Comment: (NOTE) The Xpert SA Assay (FDA approved for NASAL specimens in patients 62 years of age and older), is one component of a comprehensive surveillance program. It is not intended to diagnose infection nor to guide or monitor treatment. Performed at [facility], [place] [address] [place], [place] [address]   MRSA Next Gen by PCR, Nasal     Status: None   Collection Time: 11/06/21  7:10 AM   Specimen: Nasal Mucosa; Nasal Swab  Result Value Ref Range Status   MRSA by PCR Next Gen NOT DETECTED NOT DETECTED Final    Comment: (NOTE) The GeneXpert MRSA Assay (FDA approved for NASAL specimens only), is one component of a comprehensive MRSA colonization surveillance program. It is not  intended to diagnose MRSA infection nor to guide or monitor treatment for MRSA infections. Test performance is not FDA approved in patients less than 34 years old. Performed at [facility], [place] [address], [place], [place] [address]   Culture, Respiratory w Gram Stain     Status: None   Collection Time: 11/06/21 12:08 PM   Specimen: Tracheal Aspirate  Result Value Ref Range Status   Specimen Description TRACHEAL ASPIRATE  Final   Special Requests NONE  Final   Gram Stain   Final    ABUNDANT WBC PRESENT, PREDOMINANTLY PMN NO ORGANISMS SEEN    Culture   Final    RARE CANDIDA ALBICANS NO STAPHYLOCOCCUS AUREUS ISOLATED No Pseudomonas species isolated Performed at [facility], [place] [address] [place], [place] [address]    Report Status 11/09/2021 FINAL  Final  Culture, blood (Routine X 2) w Reflex to ID Panel     Status: None   Collection Time: 11/06/21  2:33 PM   Specimen: BLOOD  Result Value Ref Range Status   Specimen Description BLOOD RIGHT ANTECUBITAL  Final   Special Requests   Final    BOTTLES DRAWN AEROBIC AND ANAEROBIC Blood Culture adequate volume   Culture   Final    NO GROWTH 5 DAYS Performed at [facility], [place] [address], [place], [place] [address]    Report Status 11/11/2021 FINAL  Final  Culture, blood (Routine X 2) w Reflex to ID Panel     Status: None   Collection Time: 11/06/21  2:40 PM   Specimen: BLOOD RIGHT WRIST  Result Value Ref Range Status   Specimen Description BLOOD RIGHT WRIST  Final   Special Requests   Final    BOTTLES DRAWN AEROBIC AND ANAEROBIC Blood Culture adequate volume   Culture   Final    NO GROWTH 5 DAYS Performed at [facility], [place] [address], [place], [place] [address]    Report Status 11/11/2021 FINAL  Final  Urine Culture  Status: None   Collection Time: 11/06/21  3:40 PM   Specimen: Urine, Random  Result Value Ref Range Status   Specimen Description URINE, RANDOM  Final   Special Requests NONE   Final   Culture   Final    NO GROWTH Performed at [facility], [address], [place], [place] [address]    Report Status 11/07/2021 FINAL  Final    Coagulation Studies: No results for input(s): LABPROT, INR in the last 72 hours.  Urinalysis: No results for input(s): COLORURINE, LABSPEC, PHURINE, GLUCOSEU, HGBUR, BILIRUBINUR, KETONESUR, PROTEINUR, UROBILINOGEN, NITRITE, LEUKOCYTESUR in the last 72 hours.  Invalid input(s): APPERANCEUR    Imaging: DG CHEST PORT 1 VIEW  Result Date: 11/11/2021 CLINICAL DATA:  Chest tube present. EXAM: PORTABLE CHEST 1 VIEW COMPARISON:  Yesterday FINDINGS: Endotracheal tube tip is at the carina. Chest tubes in place with trace right apical pneumothorax, stable. Feeding tube at least reaches the stomach. Swan-Ganz catheter from the right with tip at the lower right hilum. Stable cardiomegaly. Left atrial implant and prior median sternotomy. Haziness of the bilateral lower chest which is improved. Improved generalized haziness of the chest. IMPRESSION: 1. Lower endotracheal tube with tip at the carina. 2. Unchanged trace right apical pneumothorax. 3. Improved edema/pulmonary inflation. Electronically Signed   By: Jorje Guild M.D.   On: 11/11/2021 07:03   ECHOCARDIOGRAM LIMITED BUBBLE STUDY  Result Date: 11/10/2021    ECHOCARDIOGRAM LIMITED REPORT   Patient Name:   [name] Date of Exam: 11/10/2021 Medical Rec #:  [id number]            Height:       62.0 in Accession #:    [id number]           Weight:       230.8 lb Date of Birth:  09/05/1956            BSA:          2.032 m Patient Age:    66 years             BP:           107/55 mmHg Patient Gender: F                    HR:           82 bpm. Exam Location:  Inpatient Procedure: Limited Echo, Color Doppler and Cardiac Doppler STAT ECHO Indications:    Respiratory Distress  History:        Patient has prior history of Echocardiogram examinations, most                 recent 10/28/2021.  Arrythmias:Atrial Fibrillation; Risk                 Factors:Dyslipidemia and Sleep Apnea. S/p watchman on 08/21/21                 and TV repair on 11/01/2021.  Sonographer:    Raquel Sarna Senior RDCS Referring Phys: [place]  1. Left ventricular ejection fraction, by estimation, is 45 to 50%. The left ventricle has mildly decreased function. The left ventricle demonstrates global hypokinesis.  2. D-shaped interventricular septum suggestive of RV pressure/volume overload. Right ventricular systolic function is moderately reduced. The right ventricular size is moderately enlarged. Tricuspid regurgitation signal is inadequate for assessing PA pressure.  3. Left atrial size was mildly dilated.  4. Positive bubble study suggests atrial  level shunt. 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 Right atrial size was mildly dilated.  5. Status post tricuspid valve repair. Mean gradient 2 mmHg with trivial regurgitation.  6. The aortic valve is tricuspid. Aortic valve regurgitation is not visualized.  7. Trivial mitral valve regurgitation. No evidence of mitral stenosis.  8. The inferior vena cava is dilated in size with <50% respiratory variability, suggesting right atrial pressure of 15 mmHg.  9. Limited echo for bubble study FINDINGS  Left Ventricle: Left ventricular ejection fraction, by estimation, is 45 to 50%. The left ventricle has mildly decreased function. The left ventricle demonstrates global hypokinesis. Right Ventricle: D-shaped interventricular septum suggestive of RV pressure/volume overload. The right ventricular size is moderately enlarged. Right ventricular systolic function is moderately reduced. Tricuspid regurgitation signal is inadequate for assessing PA pressure. Left Atrium: Left atrial size was mildly dilated. Right Atrium: Positive bubble study suggests atrial level shunt. 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 Right  atrial size was mildly dilated. Pericardium: Trivial pericardial effusion is present. Mitral Valve: There is mild calcification of the mitral valve leaflet(s). Mild mitral annular calcification. Trivial mitral valve regurgitation. No evidence of mitral valve stenosis. Tricuspid Valve: Status post tricuspid valve repair. Mean gradient 2 mmHg with trivial regurgitation. The tricuspid valve is has been repaired/replaced. Tricuspid valve regurgitation is trivial. Aortic Valve: The aortic valve is tricuspid. Aortic valve regurgitation is not visualized. Venous: The inferior vena cava is dilated in size with less than 50% respiratory variability, suggesting right atrial pressure of 15 mmHg. Additional Comments: A device lead is visualized in the right ventricle. RIGHT VENTRICLE RV S prime:     7.43 cm/s TAPSE (M-mode): 0.8 cm TRICUSPID VALVE TV Peak grad:   4.5 mmHg TV Mean grad:   2.0 mmHg TV Vmax:        1.06 m/s TV Vmean:       74.6 cm/s TV VTI:         0.31 msec Dalton McleanMD Electronically signed by Franki Monte Signature Date/Time: 11/10/2021/3:06:10 PM    Final      Medications:     prismasol BGK 4/2.5 800 mL/hr at 11/12/21 0535    prismasol BGK 4/2.5 400 mL/hr at 11/12/21 0535   sodium chloride 20 mL/hr at 11/07/21 1800   sodium chloride     sodium chloride 5 mL/hr at 11/10/21 0800   sodium chloride     amiodarone 30 mg/hr (11/12/21 0700)   epinephrine 5 mcg/min (11/12/21 0700)   epoprostenol (VELETRI) for inhalation 1.60m/50mL (30,000 ng/mL) 50 ng/kg/min (11/12/21 0340)   feeding supplement (VITAL AF 1.2 CAL) 55 mL/hr at 11/12/21 0300   fentaNYL infusion INTRAVENOUS 200 mcg/hr (11/12/21 0700)   heparin 10,000 units/ 20 mL infusion syringe 500 Units/hr (11/11/21 1622)   lactated ringers 5 mL/hr at 11/12/21 0700   midazolam 5 mg/hr (11/12/21 0700)   norepinephrine (LEVOPHED) Adult infusion 15 mcg/min (11/12/21 0700)   piperacillin-tazobactam Stopped (11/12/21 0438)   prismasol BGK 4/2.5  1,500 mL/hr at 11/12/21 0404   sodium phosphate  Dextrose 5% IVPB 44 mL/hr at 11/12/21 0700   vancomycin Stopped (11/11/21 1812)   vasopressin 0.03 Units/min (11/12/21 0700)    acetaminophen (TYLENOL) oral liquid 160 mg/5 mL  650 mg Per Tube Q6H   artificial tears  1 application. Both Eyes Q8H   aspirin EC  325 mg Oral Daily   Or   aspirin  324 mg Per Tube Daily   atorvastatin  10  mg Per Tube q AM   bisacodyl  10 mg Oral Daily   Or   bisacodyl  10 mg Rectal Daily   buPROPion  75 mg Per Tube BID   chlorhexidine gluconate (MEDLINE KIT)  15 mL Mouth Rinse BID   [START ON 11/13/2021] Chlorhexidine Gluconate Cloth  6 each Topical Daily   docusate  100 mg Per Tube BID   feeding supplement (PROSource TF)  45 mL Per Tube BID   insulin aspart  0-24 Units Subcutaneous Q4H   insulin detemir  8 Units Subcutaneous BID   mouth rinse  15 mL Mouth Rinse 10 times per day   metoCLOPramide (REGLAN) injection  5 mg Intravenous Q6H   pantoprazole sodium  40 mg Per Tube Daily   polyethylene glycol  17 g Per Tube Daily   sennosides  10 mL Per Tube BID   sodium chloride flush  10-40 mL Intracatheter Q12H   sodium chloride flush  3 mL Intravenous Q12H   venlafaxine  75 mg Per Tube BID WC   sodium chloride, Place/Maintain arterial line **AND** sodium chloride, fentaNYL, heparin, lip balm, metoprolol tartrate, midazolam, ondansetron (ZOFRAN) IV, rocuronium bromide, sodium chloride, sodium chloride flush, sodium chloride flush  Assessment/ Plan:  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  Started on CRRT 11/08/2021 with right femoral temporal dialysis catheter placed.  Continue on CRRT.  Patient required multiple pressors ANEMIA-no ESA's at this point. MBD-appears stable HTN/VOL-continue ultrafiltration.  100 cc an hour ACCESS-femoral dialysis catheter Hypophosphatemia we will add sodium phosphate    LOS: [place] _0 _1 :03 AM

## 2021-11-12 NOTE — Progress Notes (Signed)
? ?   ?  [address] ?      [place] [address] ?            [phone]   ? ?  ?Intubated, sedated ?Required paralysis for dyssynchronous with vent and desat ? ?BP (!) 100/32   Pulse 80   Temp 98.6 ?F (37 ?C)   Resp (!) 28   Ht '5\' 2"'$  (1.575 m)   Wt 99.8 kg   SpO2 94%   BMI 40.24 kg/m?  ?55/29 CI= 2.4 ?Drips unchanged ? ? ?Intake/Output Summary (Last 24 hours) at 11/12/2021 1741 ?Last data filed at 11/12/2021 1700 ?Gross per 24 hour  ?Intake 4466.59 ml  ?Output 5720 ml  ?Net -1253.41 ml  ? ?Now on 100% and 16 PEEP ? ?Revonda Standard Roxan Hockey, MD ?[facility] ?[phone] ? ?

## 2021-11-12 NOTE — Progress Notes (Signed)
Forehead oxygen probe gives off falsely low O2 saturations in low 80s (baseline 93%) when placed above R eye. ABG was done to correlate and oxygen probe will stay above L eye. ?

## 2021-11-12 NOTE — Progress Notes (Addendum)
Advanced Heart Failure Rounding Note  PCP-Cardiologist: None   Subjective:   02/20: Transferred to [facility] for GI bleed + pericardial effusion 02/22: R/LHC with normal cors, equalized R and L sided pressures with no evidence of LV-RV interaction, RA 17, PA 33/21 (25), Fick CO/CI 4.2/2.2, severely reduced PAPi (in setting of severe TR), PA sat 47%, 48% 2/24: S/P Two polyps in the distal ascending colon, removed with a cold snare. Resected and retrieved. Right colonic ischemic ulcers (healing, no active bleeding, biopsied)-likely etiology of LGI. Moderate sigmoid diverticulosis. 2/25: Diuresed with IV lasix.  2/26: Switched to torsemide 60 /60 .  2/28: S/P TVR . Off pump had VT/VT when PA catheter placed. Had cardiac massage. Cardioverted x2. Unsuccessful. Placed back on pump with restoration of SR. Started on IV amiodarone.  3/1: Extubated 3/2: Reintubated for acute hypoxic respiratory failure. Stat CXR ? Possible b/l PNA. Concern for developing septic shock, WBC 39K. PCT 2.49. Increased pressor requirements w/ NE and VP. Milrinone discontinued w/ low SVR. Several rounds of bicarb for persistent metabolic acidosis. Started on vanc + zosyn. 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 LVEF 45-50%, D-shaped interventricular septum, RV moderately reduced, trivial TR    Remains intubated and sedated. Oxygenation a little worse overnight. FiO2 increased to 90%. CXR showed low endotracheal tube, pulmonary edema and trace rt apical PTX. ETT adjusted/pulled back by CCM this morning.   Remains on CVVHD. Limited volume removal overnight due to hypotension. NE titrated up to 15. Now currently pulling 100 ml/hr. CVP ~21. Still up 33 lb above pre-op wt.   Currently on NE 15, Epi 5 + VP 0.03. CI better today, 2.5. Co-ox 59%. SVR low 711   Finished stress dosed steroids  yesterday. Day 7/7 of abx. On Vanc/Zosyn. WBC 21>>16K. AF   Remains on Epoprostenol, 50 ng/kg/min.   Hgb 9.5>>7.7   Swan #s CVP 21 PAP 56/27 (37) CO 4.84 CI 2.48 PAPi 1.38 Co-ox 59%  SVR 711   Objective:   Weight Range: 99.8 kg Body mass index is 40.24 kg/m.   Vital Signs:   Temp:  [97.9 F (36.6 C)-99.5 F (37.5 C)] 98.1 F (36.7 C) (03/08 0700) Pulse Rate:  [80-81] 80 (03/08 0700) Resp:  [13-31] 26 (03/08 0700) BP: (80-95)/(36-45) 95/39 (03/08 0401) SpO2:  [87 %-99 %] 95 % (03/08 0700) Arterial Line BP: (90-117)/(40-53) 101/47 (03/08 0700) FiO2 (%):  [70 %-100 %] 90 % (03/08 0749) Weight:  [99.8 kg] 99.8 kg (03/08 0400) Last BM Date : 11/09/21  Weight change: Filed Weights   11/10/21 0500 11/11/21 0400 11/12/21 0400  Weight: 104.7 kg 101.5 kg 99.8 kg    Intake/Output:   Intake/Output Summary (Last 24 hours) at 11/12/2021 0813 Last data filed at 11/12/2021 0700 Gross per 24 hour  Intake 4554.39 ml  Output 6505 ml  Net -1950.61 ml      Physical Exam   CVP 21  General:  ill WF, intubated and sedated  HEENT: normal  + ETT  Neck: supple. JVD to jaw + Rt IJ swan cath Carotids 2+ bilat; no bruits. No lymphadenopathy or thyromegaly appreciated. Cor: PMI nondisplaced. Regular rate & rhythm. No rubs, gallops or murmurs. + CTs  Lungs: intubated + expiratory rhonchi diffusely  Abdomen: soft, nontender, nondistended. No hepatosplenomegaly. No bruits or masses. Good bowel sounds. Extremities: no cyanosis, clubbing, rash, 3+ b/l LEE  Neuro: intubated and  sedated    Telemetry   A paced 80s. No further VT /VF. Personally reviewed.    Labs    CBC Recent Labs    11/11/21 0312 11/11/21 0403 11/11/21 2141 11/12/21 0358  WBC 20.7*  --   --  16.1*  NEUTROABS 17.2*  --   --   --   HGB 9.0*   < > 9.5* 7.7*  HCT 29.6*   < > 28.0* 26.5*  MCV 91.6  --   --  96.0  PLT 161  --   --  152   < > = values in this interval not displayed.   Basic Metabolic  Panel Recent Labs    11/11/21 0312 11/11/21 0403 11/11/21 1554 11/11/21 1603 11/11/21 2141 11/12/21 0358  NA 132*   < > 136   < > 138 133*  K 4.5   < > 4.1   < > 3.9 3.9  CL 101  --  102  --   --  100  CO2 22  --  25  --   --  23  GLUCOSE 198*  --  183*  --   --  148*  BUN 18  --  21  --   --  18  CREATININE 0.82  --  0.81  --   --  0.75  CALCIUM 7.6*  --  7.4*  --   --  7.9*  MG 2.4  --   --   --   --  2.4  PHOS 1.4*  --  2.3*  --   --  1.1*   < > = values in this interval not displayed.   Liver Function Tests Recent Labs    11/10/21 0431 11/10/21 1555 11/11/21 0312 11/11/21 1554 11/12/21 0358  AST 53*  --  61*  --   --   ALT 8  --  12  --   --   ALKPHOS 335*  --  429*  --   --   BILITOT 1.2  --  1.2  --   --   PROT 5.2*  --  5.7*  --   --   ALBUMIN 1.6*   < > 1.7* 2.3* 2.8*   < > = values in this interval not displayed.   No results for input(s): LIPASE, AMYLASE in the last 72 hours. Cardiac Enzymes No results for input(s): CKTOTAL, CKMB, CKMBINDEX, TROPONINI in the last 72 hours.  BNP: BNP (last 3 results) Recent Labs    02/24/21 1357 07/16/21 1145 10/14/2021 2138  BNP 326.4* 523.7* 477.9*    ProBNP (last 3 results) No results for input(s): PROBNP in the last 8760 hours.   D-Dimer No results for input(s): DDIMER in the last 72 hours. Hemoglobin A1C No results for input(s): HGBA1C in the last 72 hours.  Fasting Lipid Panel No results for input(s): CHOL, HDL, LDLCALC, TRIG, CHOLHDL, LDLDIRECT in the last 72 hours.   Thyroid Function Tests No results for input(s): TSH, T4TOTAL, T3FREE, THYROIDAB in the last 72 hours.  Invalid input(s): FREET3   Other results:   Imaging    DG CHEST PORT 1 VIEW  Result Date: 11/12/2021 CLINICAL DATA:  Intubation EXAM: PORTABLE CHEST 1 VIEW COMPARISON:  Yesterday FINDINGS: Endotracheal tube with tip at the carina. Feeding tube at least reaches the stomach. Chest tubes in place. Swan-Ganz catheter with tip at the  right lower lobe or inter lobar pulmonary artery. Biventricular pacer. Postoperative heart with stable enlargement. Diffuse hazy appearance of the  chest likely pleural fluid, atelectasis, edema. Trace right apical pneumothorax These results will be called to the ordering clinician or representative by the Radiologist Assistant, and communication documented in the PACS or [employer]. IMPRESSION: 1. Low endotracheal tube, tip at the carina. 2. Continued diffuse haziness of the chest from edema/atelectasis. 3. Trace right apical pneumothorax. Electronically Signed   By: Jorje Guild M.D.   On: 11/12/2021 07:12     Medications:     Scheduled Medications:  acetaminophen (TYLENOL) oral liquid 160 mg/5 mL  650 mg Per Tube Q6H   artificial tears  1 application. Both Eyes Q8H   aspirin EC  325 mg Oral Daily   Or   aspirin  324 mg Per Tube Daily   atorvastatin  10 mg Per Tube q AM   bisacodyl  10 mg Oral Daily   Or   bisacodyl  10 mg Rectal Daily   buPROPion  75 mg Per Tube BID   chlorhexidine gluconate (MEDLINE KIT)  15 mL Mouth Rinse BID   [START ON 11/13/2021] Chlorhexidine Gluconate Cloth  6 each Topical Daily   docusate  100 mg Per Tube BID   feeding supplement (PROSource TF)  45 mL Per Tube BID   insulin aspart  0-24 Units Subcutaneous Q4H   insulin detemir  8 Units Subcutaneous BID   mouth rinse  15 mL Mouth Rinse 10 times per day   metoCLOPramide (REGLAN) injection  5 mg Intravenous Q6H   pantoprazole sodium  40 mg Per Tube Daily   polyethylene glycol  17 g Per Tube Daily   sennosides  10 mL Per Tube BID   sodium chloride flush  10-40 mL Intracatheter Q12H   sodium chloride flush  3 mL Intravenous Q12H   venlafaxine  75 mg Per Tube BID WC    Infusions:   prismasol BGK 4/2.5 800 mL/hr at 11/12/21 0535    prismasol BGK 4/2.5 400 mL/hr at 11/12/21 0535   sodium chloride 20 mL/hr at 11/07/21 1800   sodium chloride     sodium chloride 5 mL/hr at 11/10/21 0800   sodium chloride      amiodarone 30 mg/hr (11/12/21 0700)   epinephrine 5 mcg/min (11/12/21 0700)   epoprostenol (VELETRI) for inhalation 1.$RemoveBeforeDE'5mg'COtUTfZZDHFMqdC$ /47mL (30,000 ng/mL) 50 ng/kg/min (11/12/21 0340)   feeding supplement (VITAL AF 1.2 CAL) 55 mL/hr at 11/12/21 0300   fentaNYL infusion INTRAVENOUS 200 mcg/hr (11/12/21 0700)   heparin 10,000 units/ 20 mL infusion syringe 500 Units/hr (11/11/21 1622)   lactated ringers 5 mL/hr at 11/12/21 0700   midazolam 5 mg/hr (11/12/21 0700)   norepinephrine (LEVOPHED) Adult infusion 15 mcg/min (11/12/21 0700)   piperacillin-tazobactam Stopped (11/12/21 0438)   prismasol BGK 4/2.5 1,500 mL/hr at 11/12/21 0748   sodium phosphate  Dextrose 5% IVPB 44 mL/hr at 11/12/21 0700   vancomycin Stopped (11/11/21 1812)   vasopressin 0.03 Units/min (11/12/21 0700)     PRN Medications: sodium chloride, Place/Maintain arterial line **AND** sodium chloride, fentaNYL, heparin, lip balm, metoprolol tartrate, midazolam, ondansetron (ZOFRAN) IV, rocuronium bromide, sodium chloride, sodium chloride flush, sodium chloride flush     Patient Profile    66 y.o. female with h/o PAF with recent failed ablation S/p Watchman implant 08/21/21,  breast CA in 1998 s/p left breast surgery and s/p Adriamycin x 4 cycles, non-ischemic cardiomyopathy/chronic systolic CHF, LBBB, s/p BiV Medtronic ICD, HTN, OSA, and CKD Stage IIIb. Transferred from OSH for acute GIB and pericardial effusion. Initial Echo here showed EF 35-40% RV  severely enlarged w/ low normal systolic fx. Moderate posterior effusion, no tamponade. Severe TR noted. R/LCH showed normal cors, LVEF 40%, Equalized right and left-sided pressure with no evidence of LV-RV interaction and evidence of severe RV failure w/ severely reduced PAPi, 0.6, in setting of severe TR. Went to OR 2/28 for TVR. Off pump had VT/VF when PA catheter placed. Had cardiac massage. Cardioverted x2. Unsuccessful. Placed back on pump with restoration of SR. Started on IV  amiodarone.   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 Acute on Chronic Systolic CHF w/ Post Cardiotomy Shock  - felt to have LBBB CM vs chemo induced (got adriamycin). 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 No effusion -Echo 02/21: EF 35-40%, RV severely enlarged with okay function, moderate MR, severe TR with flail posterior leaflet -RHC 02/22: RA 17, PA 33/21 (25), PCWP 18, PAPi 0.6 (in setting of severe TR), PA sat 47%, 48% -S/p TVR 2/28. Intraop TEE EF 40-45%, RV mildly reduced.  VT arrest when coming off pump  - Mixed septic/cardiogenic shock post-operatively - Milrinone discontinued 3/2 w/ low SVR - Repeat limited echo 3/6 EF 45-50%, RV mod reduced, R>>L atrial level shunt  - Today remains on Epi 5, NE 15, VP 0.03. SVR down again, 711 today, but CI better at 2.5. PAPi 1.4 - Continue current support, increase VP to 0.04  - On Broad spectrum vanc/Zosyn. Pan-cultures NGTD.  - Still markedly volume overloaded, 33 lb above pre-op wt. Continue CVVH - Continue to follow hemodynamics off swan  3. Acute Hypoxic Respiratory Failure - Extubated 3/1 - Reintubated 3/2. CXR ? B/l PNA  - PCT 2.49>>3.66>>4.15 - WBC 24>>36>>34>39>31>>18.5>>20>>17>>20>>17K. Covering w/ empiric abx, Vanc + zosyn  - Cultures NGTD - CXR with bilateral diffuse infiltrates, suspect PNA + volume overload/pulmonary edema. FiO2 90%, needs fluid off.  - Fluid removal via CVVH.   4. Pericardial effusion: -Doubt from Watchman procedure in 12/22 -Effusion appears moderate in size on echo 02/21.  -[place] 02/22 with equalization of right and left-sided pressures with no evidence of LV-RV interaction -There was no  evidence of tamponade. Suspect main issue is severe TR with markedly elevated RA pressures and inability to drain pericardium.  5. PAF/tachycardia - Unable to complete ablation 10/22 d/t pericardial effusion - S/p Watchman implant 08/21/21 - continue amio gtt 30 mg/hr while on inotropes/ pressors  - heparin through CVVHD circuit   6. AKI on CKD IIIb/IV/ ATN - Creatinine up to 2.5, oliguric nearing anuric. Suspect ATN.  - In setting of GI bleed and hypotension +/- cardiorenal  - Marked volume overload with no response to IV diuretics => now on CVVH. Nephrology following    6. GI bleeding - BRBPR + melena 4-5 days prior to admission - Per her report CT at OSH with diverticulosis but no diverticulitis - 1 u PRBCs this admit -EGD/Colonoscopy- R colonic ischemic ulcers - suspect in the setting of low output hf., 2 polyps removed, diverticulosis. Polyps no malignancy   - On PPI  -Transfuse < 8.  - Hgb 7.7 today, transfuse x 1uRBC   7. OSA - Unable to tolerate CPAP  8. Hypokalemia/ hypomagnesia - K /Mag stable.  9. Ileus - suspect related to mesenteric ischemia in setting of low arterial pressure and high venous outflow pressures from RV failure - No h/o ischemic colitis symptoms - Has CT abdomen from OSH (non contrast) no documentation of ab arterial calcification - Improvintg w/ Reglan, neostigmine. BM on 3/5. Now off Neostigmine.   10. VT/VF -Intraoperative. Placed on amio drip.  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, PA-C  11/12/2021, 8:13 AM  Advanced Heart Failure Team Pager [phone] (M-F; 7a - 5p)  Please contact [facility] for night-coverage after hours (5p -7a ) and weekends on [email or web address]  Agree with above. She remains critically ill on  multiple pressors, inhaled veletri, CVVHD and full vent support. Co-ox and oxygenation remain marginal .  General:  Critically-ill appearing on vent HEENT: normal + ETT + cor-trak Neck: supple. RIJ swan Cor: PMI nondisplaced. Regular rate & rhythm.  Lungs: + crackles Abdomen: soft, nontender, + distended. No hepatosplenomegaly. No bruits or masses. Hypoactive bowel sounds. Extremities: no cyanosis, clubbing, rash, 3+  edema Neuro: intubated/sedated  She remains critically ill despite full support. Will increase VP to 0.04. Continue to try to pull at least -100cc/hr with CVVHD. Appreciate CCM and Nephrology support. Prognosis extremely guarded.   CRITICAL CARE Performed by: Glori Bickers  Total critical care time: 45 minutes  Critical care time was exclusive of separately billable procedures and treating other patients.  Critical care was necessary to treat or prevent imminent or life-threatening deterioration.  Critical care was time spent personally by me (independent of midlevel providers or residents) on the following activities: development of treatment plan with patient and/or surrogate as well as nursing, discussions with consultants, evaluation of patient's response to treatment, examination of patient, obtaining history from patient or surrogate, ordering and performing treatments and interventions, ordering and review of laboratory studies, ordering and review of radiographic studies, pulse oximetry and re-evaluation of patient's condition.  Glori Bickers, MD  10:14 PM

## 2021-11-12 NOTE — Progress Notes (Signed)
? ?NAME:  [name], MRN:  [id number], DOB:  03-10-1956, LOS: 5 ?ADMISSION DATE:  10/10/2021, CONSULTATION DATE:  11/06/21 ?REFERRING MD:  Nils Pyle, CHIEF COMPLAINT:  hypoxia  ? ?History of Present Illness:  ?Mrs. Kedzierski is a 66 year old woman with a history of HFrEF and paroxysmal atrial fibrillation who was transferred to [facility] with GI bleed and pericardial effusion.  She had recently undergone watchman insertion in January 2023.  She underwent EGD which did not explain bleeding.  Colonoscopy demonstrated sessile polyps, which were removed in the ascending colon and areas of ischemic colonic ulcers.  There was moderate sigmoid diverticulosis.  Heart catheterization demonstrated normal coronary arteries with equalization of left and right-sided heart pressures, severely reduced PAPi due to severe tricuspid regurgitation.  Swan-Ganz catheter was left in place to assist with weaning inotropic support.  Echocardiograms have previously demonstrated reduced RV function and dilation. On 2/28 she underwent Tricuspid repair and ring annuplasty. Post-op day 1 she was extubated but developed rapidly worsening respiratory failure overnight on 3/2 requiring re-intubation by anesthesia. CXR post-intubation had bilateral infiltrates. ? 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 ?Significant Hospital Events: ?Including procedures, antibiotic start and stop dates in addition to other pertinent events   ? ?2/20 admitted for pericardial effusion without tamponade ?2/22 L&R heart cath ?2/24 EGD & colonoscopy ?2/28 tricuspid valve repair; Vt arrest coming off bypass, back on bypass to stabilize ?3/1 extubated post op, reintubated overnight for hypoxia ?3/2 PCCM consulted for vent management.  Started on vancomycin and Zosyn for presumed aspiration pneumonia ?3/3 remains in a mixed picture of cardiogenic  and septic shock.  ?3/4 started on CRRT ?3/5>> improving pressor needs, stubborn O2 needs ? ?Interim History / Subjective:  ?Remains on high vent requirements, PEEP-dependent. ? ?Heavily sedated, required a couple doses NMB given yesterday for vent desynchrony. ? ?Objective   ?Blood pressure (!) 95/39, pulse 80, temperature 98.1 ?F (36.7 ?C), resp. rate (!) 26, height '5\' 2"'$  (1.575 m), weight 99.8 kg, SpO2 95 %. ?PAP: (34-54)/(18-29) 54/24 ?CVP:  [9 mmHg-21 mmHg] 18 mmHg ?CO:  [3.8 L/min-5.4 L/min] 4.9 L/min ?CI:  [1.9 L/min/m2-2.8 L/min/m2] 2.5 L/min/m2  ?Vent Mode: PRVC ?FiO2 (%):  [70 %-100 %] 100 % ?Set Rate:  [28 bmp] 28 bmp ?Vt Set:  [400 mL] 400 mL ?PEEP:  [12 cmH20-16 cmH20] 16 cmH20 ?Plateau Pressure:  [32 cmH20-37 cmH20] 32 cmH20  ? ?Intake/Output Summary (Last 24 hours) at 11/12/2021 0850 ?Last data filed at 11/12/2021 0700 ?Gross per 24 hour  ?Intake 4554.39 ml  ?Output 6505 ml  ?Net -1950.61 ml  ? ? ?Filed Weights  ? 11/10/21 0500 11/11/21 0400 11/12/21 0400  ?Weight: 104.7 kg 101.5 kg 99.8 kg  ? ? ?Examination: ?No distress ?Still marked anasarca but slowly improving each day ?R toes are a bit dusky but pulses dopplerable: stable ?Sternotomy site looks okay, drains in place, minimal output ?RASS -5 ?Lungs with diminished sounds bases, rhonci at apices ? ?SvO2 58% ?Phos low being repleted ?Remains on veletri@ 50ng/kg/min ?H/H down slightly ? ?Resolved Hospital Problem list   ? ? 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  TVR complicated by VT arrest coming off pump, brief. ?Fluid overloaded  state of heart. ?Renal failure with oliguria- septic ATN and cardiorenal now on CRRT ?Postoperative mixed cardiogenic and septic shock- improved but still  severe ?Pericardial effusion moderate without tamponade.  Seems better on more recent echo. ?Chronic AF, hx NSVT, AICD in place ?Ischemic colitis- with ABLA, was resolved,.  Had nonbloody BM 3/4. ?HLD, GERD, OSA ontolerant to BIPAP ?Hx breast ca 1998 with exposure to adriamycin ?Postop ileus- again an issue, resuming neostigmine ?DM - look okay with current regimen ?ETT balloon leak- too dangerous to change out at present, only 20cc's leak ? ?- CRRT with pull per TCTS and CHF teams ?- Continue vanc/zosyn, 7 day duration ?- Titrate pressors to MAP 65 ?- Continue bowel regimen as ordered; add back neostigmine ?- Lung protective ventilation on ARDSnet protocol, allow permissive hypercapnea, limit driving pressures, continue veletri with sedation/NMB titrated to vent synchrony; once FiO2 and PEEP are more reasonable will consider veletri wean ?- Work toward euvolemia and this will hopefully help shunt fraction to allow vent wean; will calculate shunt fraction today ?- Continue aggressive care, family aware of guarded prognosis ? ?Best Practice (right click and "Reselect all SmartList Selections" daily)  ? ?Diet/type: tubefeeds ?DVT prophylaxis: SCD ?GI prophylaxis: PPI ?Lines: Central line and Arterial Line ?Foley:  Yes, and it is still needed ?Code Status:  full code ?Last date of multidisciplinary goals of care discussion [3/6] ? ? ?Patient critically ill due to multiorgan failure ?Interventions to address this today vent/CRRT/pressor titration ?Risk of deterioration without these interventions is high ? ?I personally spent 33 minutes providing critical care not including any separately billable procedures ? ?Erskine Emery MD ?[facility] Pulmonary Critical Care ? ?Prefer epic messenger for cross cover needs ?If after hours, please call E-link ? ? ?

## 2021-11-12 NOTE — Progress Notes (Signed)
Assisted tele visit to patient with daughter. ? ?Romilda Joy, RN  ? ?

## 2021-11-12 NOTE — Progress Notes (Signed)
8 Days Post-Op Procedure(s) (LRB): ?TRICUSPID VALVE REPAIR USING EDWARDS MC3 TRICUSPID ANNULOPLASTY RING SIZE 28MM (N/A) ?TRANSESOPHAGEAL ECHOCARDIOGRAM (TEE) (N/A) ?Subjective: ?Intubated, sedated ? ?Objective: ?Vital signs in last 24 hours: ?Temp:  [97.9 ?F (36.6 ?C)-99.5 ?F (37.5 ?C)] 98.1 ?F (36.7 ?C) (03/08 0700) ?Pulse Rate:  [80-81] 80 (03/08 0700) ?Cardiac Rhythm: Ventricular paced (03/08 0400) ?Resp:  [13-31] 26 (03/08 0700) ?BP: (71-95)/(36-57) 95/39 (03/08 0401) ?SpO2:  [87 %-100 %] 95 % (03/08 0700) ?Arterial Line BP: (90-117)/(40-53) 101/47 (03/08 0700) ?FiO2 (%):  [70 %-100 %] 90 % (03/08 0401) ?Weight:  [99.8 kg] 99.8 kg (03/08 0400) ? ?Hemodynamic parameters for last 24 hours: ?PAP: (34-54)/(18-29) 54/24 ?CVP:  [9 mmHg-21 mmHg] 18 mmHg ?PCWP:  [24 mmHg] 24 mmHg ?CO:  [3 L/min-5.4 L/min] 4.9 L/min ?CI:  [1.6 L/min/m2-2.8 L/min/m2] 2.5 L/min/m2 ? ?Intake/Output from previous day: ?03/07 0701 - 03/08 0700 ?In: 4692.3 [I.V.:1871.1; NG/GT:1830; IV Piggyback:991.2] ?Out: 2992  ?Intake/Output this shift: ?No intake/output data recorded. ? ?General appearance: ill appearing ?Neurologic: sedated ?Heart: regular rate and rhythm ?Lungs: rhonchi bilaterally and wheezes bilaterally ?Abdomen: mildly distended ?Extremities: edema 3+ ? ?Lab Results: ?Recent Labs  ?  11/11/21 ?0312 11/11/21 ?0403 11/11/21 ?2141 11/12/21 ?0358  ?WBC 20.7*  --   --  16.1*  ?HGB 9.0*   < > 9.5* 7.7*  ?HCT 29.6*   < > 28.0* 26.5*  ?PLT 161  --   --  152  ? < > = values in this interval not displayed.  ? ?BMET:  ?Recent Labs  ?  11/11/21 ?1554 11/11/21 ?1603 11/11/21 ?2141 11/12/21 ?0358  ?NA 136   < > 138 133*  ?K 4.1   < > 3.9 3.9  ?CL 102  --   --  100  ?CO2 25  --   --  23  ?GLUCOSE 183*  --   --  148*  ?BUN 21  --   --  18  ?CREATININE 0.81  --   --  0.75  ?CALCIUM 7.4*  --   --  7.9*  ? < > = values in this interval not displayed.  ?  ?PT/INR: No results for input(s): LABPROT, INR in the last 72 hours. ?ABG ?   ?Component Value  Date/Time  ? PHART 7.319 (L) 11/11/2021 2141  ? HCO3 25.2 11/11/2021 2141  ? TCO2 27 11/11/2021 2141  ? ACIDBASEDEF 1.0 11/11/2021 2141  ? O2SAT 58.7 11/12/2021 0358  ? ?CBG (last 3)  ?Recent Labs  ?  11/11/21 ?1919 11/11/21 ?2351 11/12/21 ?0358  ?GLUCAP 134* 134* 152*  ? ? ?Assessment/Plan: ?S/P Procedure(s) (LRB): ?TRICUSPID VALVE REPAIR USING EDWARDS MC3 TRICUSPID ANNULOPLASTY RING SIZE 28MM (N/A) ?TRANSESOPHAGEAL ECHOCARDIOGRAM (TEE) (N/A) ?Remains critically ill ?NEURO- sedated ?Cv- paced rhythm, on amiodarone ? Co-ox 59, PA 59/31, CVP= 25, CI= 2.5 ? On epi 5 norepi 15 vasopressin 0.03 ? Epoprostenol at 50 ?RESP- VDRF ? Oxygenation a little worse overnight, on 90% and 16 PEEP ? CXR shows pulmonary edema/ ARDS picture  ? Vent per CCM ?RENAL- oliguric acute renal failure ? On CVVH- 2L negative yesterday ? Still 14 kg above preop ?ENDO- CBG mildly elevated-  ?Levemir + SSI ?GI- on TF ?ID- Afebrile, WBC down slightly to 16K ?on vanco and Zosyn ?Anticoagulation- on heparin with CVVH  ? ?Prognosis remains guarded ? ? LOS: 16 days  ? ? ?[name] ?11/12/2021 ? ? ?

## 2021-11-12 NOTE — Progress Notes (Signed)
Pt's ETT was pulled by to 22 at the lip per Dr.Smith. No complications noted. ?

## 2021-11-13 ENCOUNTER — Inpatient Hospital Stay (HOSPITAL_COMMUNITY): Payer: Medicare PPO

## 2021-11-13 ENCOUNTER — Encounter (HOSPITAL_COMMUNITY): Payer: Self-pay | Admitting: Cardiothoracic Surgery

## 2021-11-13 DIAGNOSIS — I3139 Other pericardial effusion (noninflammatory): Secondary | ICD-10-CM | POA: Diagnosis not present

## 2021-11-13 LAB — COOXEMETRY PANEL
Carboxyhemoglobin: 1.5 % (ref 0.5–1.5)
Carboxyhemoglobin: 1.5 % (ref 0.5–1.5)
Methemoglobin: <0.7 % (ref 0.0–1.5)
Methemoglobin: 4.7 % — ABNORMAL HIGH (ref 0.0–1.5)
O2 Saturation: 47.9 %
O2 Saturation: 68.2 %
Total hemoglobin: 7.1 g/dL — CL (ref 12.0–16.0)
Total hemoglobin: 8.2 g/dL — ABNORMAL LOW (ref 12.0–16.0)

## 2021-11-13 LAB — CBC
HCT: 25.9 % — ABNORMAL LOW (ref 36.0–46.0)
Hemoglobin: 7.2 g/dL — ABNORMAL LOW (ref 12.0–15.0)
MCH: 27.8 pg (ref 26.0–34.0)
MCHC: 27.8 g/dL — ABNORMAL LOW (ref 30.0–36.0)
MCV: 100 fL (ref 80.0–100.0)
Platelets: 161 10*3/uL (ref 150–400)
RBC: 2.59 MIL/uL — ABNORMAL LOW (ref 3.87–5.11)
RDW: 21.5 % — ABNORMAL HIGH (ref 11.5–15.5)
WBC: 14.5 10*3/uL — ABNORMAL HIGH (ref 4.0–10.5)
nRBC: 37.8 % — ABNORMAL HIGH (ref 0.0–0.2)

## 2021-11-13 LAB — POCT I-STAT 7, (LYTES, BLD GAS, ICA,H+H)
Acid-base deficit: 3 mmol/L — ABNORMAL HIGH (ref 0.0–2.0)
Acid-base deficit: 4 mmol/L — ABNORMAL HIGH (ref 0.0–2.0)
Acid-base deficit: 4 mmol/L — ABNORMAL HIGH (ref 0.0–2.0)
Bicarbonate: 24.9 mmol/L (ref 20.0–28.0)
Bicarbonate: 24.5 mmol/L (ref 20.0–28.0)
Bicarbonate: 23.7 mmol/L (ref 20.0–28.0)
Calcium, Ion: 1.2 mmol/L (ref 1.15–1.40)
Calcium, Ion: 1.21 mmol/L (ref 1.15–1.40)
Calcium, Ion: 1.18 mmol/L (ref 1.15–1.40)
HCT: 27 % — ABNORMAL LOW (ref 36.0–46.0)
HCT: 28 % — ABNORMAL LOW (ref 36.0–46.0)
HCT: 27 % — ABNORMAL LOW (ref 36.0–46.0)
Hemoglobin: 9.2 g/dL — ABNORMAL LOW (ref 12.0–15.0)
Hemoglobin: 9.5 g/dL — ABNORMAL LOW (ref 12.0–15.0)
Hemoglobin: 9.2 g/dL — ABNORMAL LOW (ref 12.0–15.0)
O2 Saturation: 80 %
O2 Saturation: 93 %
O2 Saturation: 98 %
Patient temperature: 37.1
Patient temperature: 36.6
Potassium: 4.7 mmol/L (ref 3.5–5.1)
Potassium: 4.7 mmol/L (ref 3.5–5.1)
Potassium: 4.5 mmol/L (ref 3.5–5.1)
Sodium: 137 mmol/L (ref 135–145)
Sodium: 137 mmol/L (ref 135–145)
Sodium: 136 mmol/L (ref 135–145)
TCO2: 27 mmol/L (ref 22–32)
TCO2: 26 mmol/L (ref 22–32)
TCO2: 25 mmol/L (ref 22–32)
pCO2 arterial: 58.1 mmHg — ABNORMAL HIGH (ref 32–48)
pCO2 arterial: 59.5 mmHg — ABNORMAL HIGH (ref 32–48)
pCO2 arterial: 52.4 mmHg — ABNORMAL HIGH (ref 32–48)
pH, Arterial: 7.239 — ABNORMAL LOW (ref 7.35–7.45)
pH, Arterial: 7.223 — ABNORMAL LOW (ref 7.35–7.45)
pH, Arterial: 7.261 — ABNORMAL LOW (ref 7.35–7.45)
pO2, Arterial: 54 mmHg — ABNORMAL LOW (ref 83–108)
pO2, Arterial: 80 mmHg — ABNORMAL LOW (ref 83–108)
pO2, Arterial: 114 mmHg — ABNORMAL HIGH (ref 83–108)

## 2021-11-13 LAB — GLUCOSE, CAPILLARY
Glucose-Capillary: 169 mg/dL — ABNORMAL HIGH (ref 70–99)
Glucose-Capillary: 167 mg/dL — ABNORMAL HIGH (ref 70–99)
Glucose-Capillary: 147 mg/dL — ABNORMAL HIGH (ref 70–99)
Glucose-Capillary: 199 mg/dL — ABNORMAL HIGH (ref 70–99)
Glucose-Capillary: 163 mg/dL — ABNORMAL HIGH (ref 70–99)
Glucose-Capillary: 188 mg/dL — ABNORMAL HIGH (ref 70–99)
Glucose-Capillary: 171 mg/dL — ABNORMAL HIGH (ref 70–99)

## 2021-11-13 LAB — RENAL FUNCTION PANEL
Albumin: 2.7 g/dL — ABNORMAL LOW (ref 3.5–5.0)
Albumin: 2.6 g/dL — ABNORMAL LOW (ref 3.5–5.0)
Anion gap: 11 (ref 5–15)
Anion gap: 12 (ref 5–15)
BUN: 15 mg/dL (ref 8–23)
BUN: 17 mg/dL (ref 8–23)
CO2: 23 mmol/L (ref 22–32)
CO2: 23 mmol/L (ref 22–32)
Calcium: 8.3 mg/dL — ABNORMAL LOW (ref 8.9–10.3)
Calcium: 8.4 mg/dL — ABNORMAL LOW (ref 8.9–10.3)
Chloride: 102 mmol/L (ref 98–111)
Chloride: 101 mmol/L (ref 98–111)
Creatinine, Ser: 0.81 mg/dL (ref 0.44–1.00)
Creatinine, Ser: 0.86 mg/dL (ref 0.44–1.00)
GFR, Estimated: >60 mL/min (ref >60)
GFR, Estimated: >60 mL/min (ref >60)
Glucose, Bld: 167 mg/dL — ABNORMAL HIGH (ref 70–99)
Glucose, Bld: 157 mg/dL — ABNORMAL HIGH (ref 70–99)
Phosphorus: 2.1 mg/dL — ABNORMAL LOW (ref 2.5–4.6)
Phosphorus: 2.8 mg/dL (ref 2.5–4.6)
Potassium: 4.6 mmol/L (ref 3.5–5.1)
Potassium: 4.2 mmol/L (ref 3.5–5.1)
Sodium: 136 mmol/L (ref 135–145)
Sodium: 136 mmol/L (ref 135–145)

## 2021-11-13 LAB — MAGNESIUM: Magnesium: 2.4 mg/dL (ref 1.7–2.4)

## 2021-11-13 LAB — APTT: aPTT: 50 seconds — ABNORMAL HIGH (ref 24–36)

## 2021-11-13 LAB — HEPARIN LEVEL (UNFRACTIONATED): Heparin Unfractionated: 0.11 IU/mL — ABNORMAL LOW (ref 0.30–0.70)

## 2021-11-13 LAB — PREPARE RBC (CROSSMATCH)

## 2021-11-13 MED ORDER — B COMPLEX-C PO TABS
1.0000 | ORAL_TABLET | Freq: Every day | ORAL | Status: DC
Start: 2021-11-13 — End: 2021-11-18
  Administered 2021-11-13 – 2021-11-17 (×5): 1
  Filled 2021-11-13 (×5): qty 1

## 2021-11-13 MED ORDER — METHYLNALTREXONE BROMIDE 12 MG/0.6ML ~~LOC~~ SOLN
12.0000 mg | Freq: Once | SUBCUTANEOUS | Status: AC
  Administered 2021-11-13: 15:00:00 12 mg via SUBCUTANEOUS
  Filled 2021-11-13: qty 0.6

## 2021-11-13 MED ORDER — FOLIC ACID 1 MG PO TABS
1.0000 mg | ORAL_TABLET | Freq: Every day | ORAL | Status: DC
  Administered 2021-11-13 – 2021-11-17 (×5): 1 mg
  Filled 2021-11-13 (×5): qty 1

## 2021-11-13 MED ORDER — PROSOURCE TF PO LIQD
45.0000 mL | Freq: Three times a day (TID) | ORAL | Status: DC
  Administered 2021-11-13 – 2021-11-17 (×12): 45 mL
  Filled 2021-11-13 (×11): qty 45

## 2021-11-13 MED ORDER — VITAL 1.5 CAL PO LIQD
1000.0000 mL | ORAL | Status: DC
Start: 2021-11-13 — End: 2021-11-18
  Administered 2021-11-13 – 2021-11-15 (×3): 1000 mL

## 2021-11-13 MED ORDER — SORBITOL 70 % SOLN
960.0000 mL | TOPICAL_OIL | Freq: Once | ORAL | Status: AC
  Administered 2021-11-13: 960 mL via RECTAL
  Filled 2021-11-13: qty 473

## 2021-11-13 MED ORDER — SODIUM PHOSPHATES 45 MMOLE/15ML IV SOLN
15.0000 mmol | Freq: Once | INTRAVENOUS | Status: AC
  Administered 2021-11-13: 08:00:00 15 mmol via INTRAVENOUS
  Filled 2021-11-13: qty 5

## 2021-11-13 MED ORDER — SODIUM CHLORIDE 0.9% IV SOLUTION: Freq: Once | INTRAVENOUS | Status: AC

## 2021-11-13 NOTE — Progress Notes (Signed)
Advanced Heart Failure Rounding Note  PCP-Cardiologist: None   Subjective:   02/20: Transferred to [facility] for GI bleed + pericardial effusion 02/22: R/LHC with normal cors, equalized R and L sided pressures with no evidence of LV-RV interaction, RA 17, PA 33/21 (25), Fick CO/CI 4.2/2.2, severely reduced PAPi (in setting of severe TR), PA sat 47%, 48% 2/24: S/P Two polyps in the distal ascending colon, removed with a cold snare. Resected and retrieved. Right colonic ischemic ulcers (healing, no active bleeding, biopsied)-likely etiology of LGI. Moderate sigmoid diverticulosis. 2/25: Diuresed with IV lasix.  2/26: Switched to torsemide 60 /60 .  2/28: S/P TVR . Off pump had VT/VT when PA catheter placed. Had cardiac massage. Cardioverted x2. Unsuccessful. Placed back on pump with restoration of SR. Started on IV amiodarone.  3/1: Extubated 3/2: Reintubated for acute hypoxic respiratory failure. Stat CXR ? Possible b/l PNA. Concern for developing septic shock, WBC 39K. PCT 2.49. Increased pressor requirements w/ NE and VP. Milrinone discontinued w/ low SVR. Several rounds of bicarb for persistent metabolic acidosis. Started on vanc + zosyn. 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 LVEF 45-50%, D-shaped interventricular septum, RV moderately reduced, trivial TR    Remains intubated and sedated on FiO2 100%  Pressors down NE 15 Epi 4 VP 0.04  PAP: (52-61)/(26-35) 56/31 CVP:  [17 mmHg-23 mmHg] 22 mmHg PCWP:  [24 mmHg] 24 mmHg CO:  [4.5 L/min-5.5 L/min] 4.7 L/min CI:  [2.3 L/min/m2-2.8 L/min/m2] 2.4 L/min/m2  Remains on CVVH pulling - 100. CVP 18 on my measurement. Co-ox 48%  CXR with bilateral infiltrates. Oxygenation still marginal  On borad spectrum abx   Objective:   Weight Range: 98 kg Body mass index is 39.52 kg/m.   Vital Signs:   Temp:   [97.5 F (36.4 C)-99.3 F (37.4 C)] 98.1 F (36.7 C) (03/09 0900) Pulse Rate:  [79-85] 80 (03/09 0900) Resp:  [18-34] 32 (03/09 0900) BP: (77-104)/(32-55) 77/55 (03/09 0800) SpO2:  [74 %-97 %] 97 % (03/09 1003) Arterial Line BP: (97-108)/(46-51) 101/49 (03/09 0900) FiO2 (%):  [100 %] 100 % (03/09 1003) Weight:  [98 kg] 98 kg (03/09 0400) Last BM Date : 11/13/21  Weight change: Filed Weights   11/11/21 0400 11/12/21 0400 11/13/21 0400  Weight: 101.5 kg 99.8 kg 98 kg    Intake/Output:   Intake/Output Summary (Last 24 hours) at 11/13/2021 1010 Last data filed at 11/13/2021 1000 Gross per 24 hour  Intake 4130.08 ml  Output 6507 ml  Net -2376.92 ml       Physical Exam   General:  Sedated on vent. Ill-appearing.  HEENT: normal + ETT + cor-trak Neck: supple. RIJ swan Cor: sternal wound ok Regular rate & rhythm. Lungs: coarse Abdomen: soft, nontender, + distended. Hypoactive bowel sounds. Extremities: no cyanosis, clubbing, rash, 3+ edema Neuro: sedated on vent    Telemetry   A paced 80s Personally reviewed  Labs    CBC Recent Labs    11/11/21 0312 11/11/21 0403 11/12/21 0358 11/12/21 0859 11/13/21 0343 11/13/21 0706  WBC 20.7*  --  16.1*  --  14.5*  --   NEUTROABS 17.2*  --   --   --   --   --   HGB 9.0*   < > 7.7*   < > 7.2* 9.2*  HCT 29.6*   < > 26.5*   < > 25.9* 27.0*  MCV 91.6  --  96.0  --  100.0  --   PLT 161  --  152  --  161  --    < > = values in this interval not displayed.    Basic Metabolic Panel Recent Labs    11/12/21 0358 11/12/21 0859 11/12/21 1615 11/12/21 2136 11/13/21 0330 11/13/21 0706  NA 133*   < > 135   < > 136 137  K 3.9   < > 4.6   < > 4.6 4.7  CL 100  --  101  --  102  --   CO2 23  --  25  --  23  --   GLUCOSE 148*  --  152*  --  167*  --   BUN 18  --  16  --  15  --   CREATININE 0.75  --  0.81  --  0.81  --   CALCIUM 7.9*  --  8.0*  --  8.3*  --   MG 2.4  --   --   --  2.4  --   PHOS 1.1*  --  2.7  --  2.1*  --     < > = values in this interval not displayed.    Liver Function Tests Recent Labs    11/11/21 0312 11/11/21 1554 11/12/21 1615 11/13/21 0330  AST 61*  --   --   --   ALT 12  --   --   --   ALKPHOS 429*  --   --   --   BILITOT 1.2  --   --   --   PROT 5.7*  --   --   --   ALBUMIN 1.7*   < > 3.0* 2.7*   < > = values in this interval not displayed.    No results for input(s): LIPASE, AMYLASE in the last 72 hours. Cardiac Enzymes No results for input(s): CKTOTAL, CKMB, CKMBINDEX, TROPONINI in the last 72 hours.  BNP: BNP (last 3 results) Recent Labs    02/24/21 1357 07/16/21 1145 11/02/2021 2138  BNP 326.4* 523.7* 477.9*     ProBNP (last 3 results) No results for input(s): PROBNP in the last 8760 hours.   D-Dimer No results for input(s): DDIMER in the last 72 hours. Hemoglobin A1C No results for input(s): HGBA1C in the last 72 hours.  Fasting Lipid Panel No results for input(s): CHOL, HDL, LDLCALC, TRIG, CHOLHDL, LDLDIRECT in the last 72 hours.   Thyroid Function Tests No results for input(s): TSH, T4TOTAL, T3FREE, THYROIDAB in the last 72 hours.  Invalid input(s): FREET3   Other results:   Imaging    DG Chest Port 1 View  Result Date: 11/13/2021 CLINICAL DATA:  Pulmonary edema. EXAM: PORTABLE CHEST 1 VIEW COMPARISON:  Prior chest radiographs 11/12/2021 and earlier. FINDINGS: An ET tube terminates 1.6 cm below the level of the carina. An enteric tube passes below the level of the left hemidiaphragm with tip excluded from the field of view. Stable position of multiple chest tubes. Right IJ approach introducer sheath with unchanged position of a Swan-Ganz catheter. Redemonstrated multi-lead implantable cardiac device. Prior median sternotomy and left atrial appendage occlusion. Unchanged cardiomegaly. Persistent bilateral interstitial and ill-defined airspace opacities throughout the bilateral lungs, likely reflecting pulmonary edema. Findings are similar as  compared to the chest radiograph performed yesterday. No evidence of pleural effusion or pneumothorax. No acute bony abnormality identified. IMPRESSION: Position of lines and tubes, as described. Unchanged persistent  bilateral interstitial and ill-defined airspace opacities throughout both lungs, likely reflecting pulmonary edema. Electronically Signed   By: Kellie Simmering D.O.   On: 11/13/2021 08:06     Medications:     Scheduled Medications:  sodium chloride   Intravenous Once   acetaminophen (TYLENOL) oral liquid 160 mg/5 mL  650 mg Per Tube Q6H   artificial tears  1 application. Both Eyes Q8H   aspirin EC  325 mg Oral Daily   Or   aspirin  324 mg Per Tube Daily   atorvastatin  10 mg Per Tube q AM   bisacodyl  10 mg Oral Daily   Or   bisacodyl  10 mg Rectal Daily   buPROPion  75 mg Per Tube BID   chlorhexidine gluconate (MEDLINE KIT)  15 mL Mouth Rinse BID   Chlorhexidine Gluconate Cloth  6 each Topical Daily   docusate  100 mg Per Tube BID   feeding supplement (PROSource TF)  45 mL Per Tube BID   insulin aspart  0-24 Units Subcutaneous Q4H   insulin detemir  8 Units Subcutaneous BID   mouth rinse  15 mL Mouth Rinse 10 times per day   metoCLOPramide (REGLAN) injection  5 mg Intravenous Q6H   neostigmine  0.25 mg Subcutaneous Q6H   pantoprazole sodium  40 mg Per Tube Daily   polyethylene glycol  17 g Per Tube Daily   sennosides  10 mL Per Tube BID   sodium chloride flush  10-40 mL Intracatheter Q12H   sodium chloride flush  3 mL Intravenous Q12H   venlafaxine  75 mg Per Tube BID WC    Infusions:   prismasol BGK 4/2.5 800 mL/hr at 11/13/21 0100    prismasol BGK 4/2.5 400 mL/hr at 11/13/21 0705   sodium chloride 20 mL/hr at 11/07/21 1800   sodium chloride     sodium chloride 5 mL/hr at 11/10/21 0800   sodium chloride     sodium chloride Stopped (11/12/21 1932)   amiodarone 30 mg/hr (11/13/21 1000)   epinephrine 4 mcg/min (11/13/21 1000)   epoprostenol (VELETRI) for  inhalation 1.58m/50mL (30,000 ng/mL) 50 ng/kg/min (11/13/21 0612)   feeding supplement (VITAL AF 1.2 CAL) 1,000 mL (11/12/21 1600)   fentaNYL infusion INTRAVENOUS 200 mcg/hr (11/13/21 1000)   heparin 10,000 units/ 20 mL infusion syringe 500 Units/hr (11/13/21 1000)   lactated ringers 5 mL/hr at 11/13/21 1000   midazolam 6 mg/hr (11/13/21 1000)   norepinephrine (LEVOPHED) Adult infusion 15 mcg/min (11/13/21 1000)   prismasol BGK 4/2.5 1,500 mL/hr at 11/13/21 07829  sodium phosphate  Dextrose 5% IVPB 43 mL/hr at 11/13/21 1000   vasopressin 0.04 Units/min (11/13/21 1000)     PRN Medications: sodium chloride, Place/Maintain arterial line **AND** sodium chloride, sodium chloride, fentaNYL, heparin, lip balm, metoprolol tartrate, midazolam, ondansetron (ZOFRAN) IV, rocuronium bromide, sodium chloride, sodium chloride flush, sodium chloride flush     Patient Profile    66y.o. female with h/o PAF with recent failed ablation S/p Watchman implant 08/21/21,  breast CA in 1998 s/p left breast surgery and s/p Adriamycin x 4 cycles, non-ischemic cardiomyopathy/chronic systolic CHF, LBBB, s/p BiV Medtronic ICD, HTN, OSA, and CKD Stage IIIb. Transferred from OSH for acute GIB and pericardial effusion. Initial Echo here showed EF 35-40% RV severely enlarged w/ low normal systolic fx. Moderate posterior effusion, no tamponade. Severe TR noted. R/LCH showed normal cors, LVEF 40%, Equalized right and left-sided pressure with no evidence of LV-RV interaction and evidence of  severe RV failure w/ severely reduced PAPi, 0.6, in setting of severe TR. Went to OR 2/28 for TVR. Off pump had VT/VF when PA catheter placed. Had cardiac massage. Cardioverted x2. Unsuccessful. Placed back on pump with restoration of SR. Started on IV amiodarone.   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 Acute on Chronic Systolic  CHF w/ Post Cardiotomy Shock  - felt to have LBBB CM vs chemo induced (got adriamycin). 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 No effusion -Echo 02/21: EF 35-40%, RV severely enlarged with okay function, moderate MR, severe TR with flail posterior leaflet -RHC 02/22: RA 17, PA 33/21 (25), PCWP 18, PAPi 0.6 (in setting of severe TR), PA sat 47%, 48% -S/p TVR 2/28. Intraop TEE EF 40-45%, RV mildly reduced.  VT arrest when coming off pump  - Mixed septic/cardiogenic shock post-operatively - Milrinone discontinued 3/2 w/ low SVR - Repeat limited echo 3/6 EF 45-50%, RV mod reduced, R>>L atrial level shunt  - Pressors decreasing slowly. Epi 4, NE 14, VP 0.04. Co-ox remains low - On Broad spectrum vanc/Zosyn. Pan-cultures NGTD.  - Still markedly volume overloaded. Continue CVVH. Increase pull to -150 as tolerated - Continue to follow hemodynamics off swan  3. Acute Hypoxic Respiratory Failure - Extubated 3/1 - Reintubated 3/2. CXR ? B/l PNA  - PCT 2.49>>3.66>>4.15 - WBC 24>>36>>34>39>31>>18.5>>20>>17>>20>>17K -> 14.5. Covering w/ empiric abx, Vanc + zosyn  - Cultures NGTD - CXR with bilateral diffuse infiltrates, suspect PNA + volume overload/pulmonary edema. FiO2 100%, needs fluid off.  - Fluid removal via CVVH.  - Appreciate CCM's management   4. PAF/tachycardia - Unable to complete ablation 10/22 d/t pericardial effusion - S/p Watchman implant 08/21/21 - continue amio gtt 30 mg/hr while on inotropes/ pressors  - heparin through CVVHD circuit   5. AKI on CKD IIIb/IV/ ATN  - Suspect ATN.  - Marked volume overload with no response to IV diuretics => now on CVVH. Nephrology following    6. GI bleeding - BRBPR + melena 4-5 days prior to admission - Per her report CT at OSH with  diverticulosis but no diverticulitis - 1 u PRBCs this admit -EGD/Colonoscopy- R colonic ischemic ulcers - suspect in the setting of low output hf., 2 polyps removed, diverticulosis. Polyps no malignancy   - On PPI  -Transfuse < 8.  - Hgb 7.1 today, transfuse x 1uRBC   7. Hypokalemia/ hypomagnesia - K /Mag stable.   8. VT/VF -Intraoperative. Placed on amio drip.  -stable, no recurrence  -continue amio gtt -keep K >4.0 and Mg >2.0  9. 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  Critical care was necessary to treat or prevent imminent or life-threatening deterioration.  Critical care was time spent personally by me (independent of midlevel providers or residents) on the following activities: development of treatment plan with patient and/or surrogate as well as nursing, discussions with consultants, evaluation of patient's response  to treatment, examination of patient, obtaining history from patient or surrogate, ordering and performing treatments and interventions, ordering and review of laboratory studies, ordering and review of radiographic studies, pulse oximetry and re-evaluation of patient's condition.  Length of Stay: [place], [place]  11/13/2021, 10:10 AM  Advanced Heart Failure Team Pager [phone] (M-F; 7a - 5p)  Please contact [facility] for night-coverage after hours (5p -7a ) and weekends on [email or web address]

## 2021-11-13 NOTE — Progress Notes (Signed)
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  Placed on CRRT for volume control.  Hemodynamics stable on epinephrine, norepinephrine and vasopressin.  Amiodarone drip for atrial fibrillation  Blood pressure 104/48 pulse 82 temperature 98 O2 sats 93% FiO2 100%  Negative fluid balance 100 cc an hour  Sodium 137 potassium 4.7 ionized calcium 1.2   Objective:  Vital signs in last 24 hours:  Temp:  [97.5 F (36.4 C)-99.3 F (37.4 C)] 98.8 F (37.1 C) (03/09 0700) Pulse Rate:  [79-82] 82 (03/09 0725) Resp:  [18-34] 28 (03/09 0725) BP: (90-104)/(32-81) 104/48 (03/09 0725) SpO2:  [74 %-97 %] 93 % (03/09 0748) Arterial Line BP: (93-108)/(43-51) 102/49 (03/09 0700) FiO2 (%):  [90 %-100 %] 100 % (03/09 0748) Weight:  [98 kg] 98 kg (03/09 0400)  Weight change: -1.8 kg Filed Weights   11/11/21 0400 11/12/21 0400 11/13/21 0400  Weight: 101.5 kg 99.8 kg 98 kg    Intake/Output: I/O last 3 completed shifts: In: 6206 [I.V.:2892.7; NG/GT:2410; IV Piggyback:903.3] Out: 2197 [JOITG:5498; Chest Tube:40]   Intake/Output this shift:  No intake/output data recorded.  Critically ill intubated sedated CVS- RRR ET tube and core track RS- CTA JVP elevated intubated ABD- BS present soft non-distended EXT- no edema   Basic Metabolic Panel: Recent Labs  Lab 11/09/21 0408 11/09/21 0551 11/10/21 0431 11/10/21 1001 11/11/21 0312 11/11/21 0403 11/11/21 1554 11/11/21 1603 11/12/21 0358 11/12/21 0859 11/12/21 1146 11/12/21 1615 11/12/21 2136 11/13/21 0330 11/13/21 0706  NA 135   < > 136   < > 132*   < > 136   < > 133*   < > 138 135 137 136 137  K 3.7   < > 4.2   < > 4.5   < > 4.1   < > 3.9   < > 4.4 4.6 4.3 4.6 4.7  CL 101   < > 102   < > 101  --  102  --  100  --   --  101  --  102  --   CO2 23   < > 24   < > 22  --  25  --  23  --    --  25  --  23  --   GLUCOSE 201*   < > 153*   < > 198*  --  183*  --  148*  --   --  152*  --  167*  --   BUN 21   < > 16   < > 18  --  21  --  18  --   --  16  --  15  --   CREATININE 1.51*   < > 0.94   < > 0.82  --  0.81  --  0.75  --   --  0.81  --  0.81  --   CALCIUM 7.5*   < > 7.6*   < > 7.6*  --  7.4*  --  7.9*  --   --  8.0*  --  8.3*  --   MG 2.4  --  2.2  --  2.4  --   --   --  2.4  --   --   --   --  2.4  --   PHOS 2.4*   < > 2.1*   < > 1.4*  --  2.3*  --  1.1*  --   --  2.7  --  2.1*  --    < > = values in this interval not displayed.     Liver Function Tests: Recent Labs  Lab 11/07/21 0305 11/08/21 0422 11/08/21 1533 11/09/21 0408 11/09/21 1600 11/10/21 0431 11/10/21 1555 11/11/21 0312 11/11/21 1554 11/12/21 0358 11/12/21 1615 11/13/21 0330  AST 98* 87*  --  61*  --  53*  --  61*  --   --   --   --   ALT 9 8  --  8  --  8  --  12  --   --   --   --   ALKPHOS 143* 192*  --  236*  --  335*  --  429*  --   --   --   --   BILITOT 0.8 1.2  --  1.0  --  1.2  --  1.2  --   --   --   --   PROT 4.9* 4.8*  --  5.0*  --  5.2*  --  5.7*  --   --   --   --   ALBUMIN 2.1* 1.8*   < > 1.7*   < > 1.6*   < > 1.7* 2.3* 2.8* 3.0* 2.7*   < > = values in this interval not displayed.    No results for input(s): LIPASE, AMYLASE in the last 168 hours. Recent Labs  Lab 11/11/21 0312  AMMONIA 62*     CBC: Recent Labs  Lab 11/09/21 0408 11/09/21 0551 11/10/21 0431 11/10/21 1001 11/11/21 0312 11/11/21 0403 11/12/21 0358 11/12/21 0859 11/12/21 1110 11/12/21 1146 11/12/21 2136 11/13/21 0343 11/13/21 0706  WBC 19.9*  --  17.7*  --  20.7*  --  16.1*  --   --   --   --  14.5*  --   NEUTROABS  --   --   --   --  17.2*  --   --   --   --   --   --   --   --   HGB 9.0*   < > 8.9*   < > 9.0*   < > 7.7*   < > 8.5* 8.8* 9.2* 7.2* 9.2*  HCT 29.1*   < > 28.0*   < > 29.6*   < > 26.5*   < > 25.0* 26.0* 27.0* 25.9* 27.0*  MCV 90.7  --  89.5  --  91.6  --  96.0  --   --   --   --   100.0  --   PLT 140*  --  120*  --  161  --  152  --   --   --   --  161  --    < > = values in this interval not displayed.     Cardiac Enzymes: No results for input(s): CKTOTAL, CKMB, CKMBINDEX, TROPONINI in the last 168 hours.  BNP: Invalid input(s): POCBNP  CBG: Recent Labs  Lab 11/12/21 1123 11/12/21 1629 11/12/21 1944 11/12/21 2320 11/13/21 0342  GLUCAP 170* 150* 189* 169* 167*     Microbiology: Results for orders placed or performed during the hospital encounter of 10/30/2021  MRSA Next Gen by PCR, Nasal     Status: None   Collection Time: 10/15/2021  8:41 PM   Specimen: Nasal Mucosa; Nasal Swab  Result Value Ref Range Status   MRSA by PCR Next Gen  NOT DETECTED NOT DETECTED Final    Comment: (NOTE) The GeneXpert MRSA Assay (FDA approved for NASAL specimens only), is one component of a comprehensive MRSA colonization surveillance program. It is not intended to diagnose MRSA infection nor to guide or monitor treatment for MRSA infections. Test performance is not FDA approved in patients less than 62 years old. Performed at [facility], [place] [address] [place], [place] [address]   Resp Panel by RT-PCR (Flu A&B, Covid) Nasopharyngeal Swab     Status: None   Collection Time: 10/30/2021  9:13 PM   Specimen: Nasopharyngeal Swab; Nasopharyngeal(NP) swabs in vial transport medium  Result Value Ref Range Status   SARS Coronavirus 2 by RT PCR NEGATIVE NEGATIVE Final    Comment: (NOTE) SARS-CoV-2 target nucleic acids are NOT DETECTED.  The SARS-CoV-2 RNA is generally detectable in upper respiratory specimens during the acute phase of infection. The lowest concentration of SARS-CoV-2 viral copies this assay can detect is 138 copies/mL. A negative result does not preclude SARS-Cov-2 infection and should not be used as the sole basis for treatment or other patient management decisions. A negative result may occur with  improper specimen collection/handling, submission  of specimen other than nasopharyngeal swab, presence of viral mutation(s) within the areas targeted by this assay, and inadequate number of viral copies(<138 copies/mL). A negative result must be combined with clinical observations, patient history, and epidemiological information. The expected result is Negative.  Fact Sheet for Patients:  EntrepreneurPulse.com.au  Fact Sheet for Healthcare Providers:  IncredibleEmployment.be  This test is no t yet approved or cleared by the [place] FDA and  has been authorized for detection and/or diagnosis of SARS-CoV-2 by FDA under an Emergency Use Authorization (EUA). This EUA will remain  in effect (meaning this test can be used) for the duration of the COVID-19 declaration under Section 564(b)(1) of the Act, 21 U.S.C.section 360bbb-3(b)(1), unless the authorization is terminated  or revoked sooner.       Influenza A by PCR NEGATIVE NEGATIVE Final   Influenza B by PCR NEGATIVE NEGATIVE Final    Comment: (NOTE) The Xpert Xpress SARS-CoV-2/FLU/RSV plus assay is intended as an aid in the diagnosis of influenza from Nasopharyngeal swab specimens and should not be used as a sole basis for treatment. Nasal washings and aspirates are unacceptable for Xpert Xpress SARS-CoV-2/FLU/RSV testing.  Fact Sheet for Patients: EntrepreneurPulse.com.au  Fact Sheet for Healthcare Providers: IncredibleEmployment.be  This test is not yet approved or cleared by the [place] FDA and has been authorized for detection and/or diagnosis of SARS-CoV-2 by FDA under an Emergency Use Authorization (EUA). This EUA will remain in effect (meaning this test can be used) for the duration of the COVID-19 declaration under Section 564(b)(1) of the Act, 21 U.S.C. section 360bbb-3(b)(1), unless the authorization is terminated or revoked.  Performed at [facility], [place] [address]  [place], [place] [address]   Culture, blood (routine x 2)     Status: None   Collection Time: 10/28/21  4:48 PM   Specimen: BLOOD  Result Value Ref Range Status   Specimen Description BLOOD SHOULDER RIGHT  Final   Special Requests   Final    BOTTLES DRAWN AEROBIC AND ANAEROBIC Blood Culture adequate volume   Culture   Final    NO GROWTH 5 DAYS Performed at [facility], [place] [address] [place], [place] [address]    Report Status 11/02/2021 FINAL  Final  Culture, blood (routine x 2)  Status: None   Collection Time: 10/28/21  5:01 PM   Specimen: BLOOD  Result Value Ref Range Status   Specimen Description BLOOD RIGHT ANTECUBITAL  Final   Special Requests   Final    BOTTLES DRAWN AEROBIC AND ANAEROBIC Blood Culture results may not be optimal due to an inadequate volume of blood received in culture bottles   Culture   Final    NO GROWTH 5 DAYS Performed at [facility], [place] [address] [place], [place] [address]    Report Status 11/02/2021 FINAL  Final  Surgical pcr screen     Status: None   Collection Time: 11/01/21  4:00 PM   Specimen: Nasal Mucosa; Nasal Swab  Result Value Ref Range Status   MRSA, PCR NEGATIVE NEGATIVE Final   Staphylococcus aureus NEGATIVE NEGATIVE Final    Comment: (NOTE) The Xpert SA Assay (FDA approved for NASAL specimens in patients 79 years of age and older), is one component of a comprehensive surveillance program. It is not intended to diagnose infection nor to guide or monitor treatment. Performed at [facility], [place] [address], [place], [place] [address]   MRSA Next Gen by PCR, Nasal     Status: None   Collection Time: 11/06/21  7:10 AM   Specimen: Nasal Mucosa; Nasal Swab  Result Value Ref Range Status   MRSA by PCR Next Gen NOT DETECTED NOT DETECTED Final    Comment: (NOTE) The GeneXpert MRSA Assay (FDA approved for NASAL specimens only), is one component of a comprehensive MRSA colonization surveillance program. It is not  intended to diagnose MRSA infection nor to guide or monitor treatment for MRSA infections. Test performance is not FDA approved in patients less than 89 years old. Performed at [facility], [place] [address], [place], [place] [address]   Culture, Respiratory w Gram Stain     Status: None   Collection Time: 11/06/21 12:08 PM   Specimen: Tracheal Aspirate  Result Value Ref Range Status   Specimen Description TRACHEAL ASPIRATE  Final   Special Requests NONE  Final   Gram Stain   Final    ABUNDANT WBC PRESENT, PREDOMINANTLY PMN NO ORGANISMS SEEN    Culture   Final    RARE CANDIDA ALBICANS NO STAPHYLOCOCCUS AUREUS ISOLATED No Pseudomonas species isolated Performed at [facility], [place] [address] [place], [place] [address]    Report Status 11/09/2021 FINAL  Final  Culture, blood (Routine X 2) w Reflex to ID Panel     Status: None   Collection Time: 11/06/21  2:33 PM   Specimen: BLOOD  Result Value Ref Range Status   Specimen Description BLOOD RIGHT ANTECUBITAL  Final   Special Requests   Final    BOTTLES DRAWN AEROBIC AND ANAEROBIC Blood Culture adequate volume   Culture   Final    NO GROWTH 5 DAYS Performed at [facility], [place] [address] [place], [place] [address]    Report Status 11/11/2021 FINAL  Final  Culture, blood (Routine X 2) w Reflex to ID Panel     Status: None   Collection Time: 11/06/21  2:40 PM   Specimen: BLOOD RIGHT WRIST  Result Value Ref Range Status   Specimen Description BLOOD RIGHT WRIST  Final   Special Requests   Final    BOTTLES DRAWN AEROBIC AND ANAEROBIC Blood Culture adequate volume   Culture   Final    NO GROWTH 5 DAYS Performed at [facility]  [facility], [place] [address] [place], [place] [address]    Report Status 11/11/2021 FINAL  Final  Urine Culture     Status: None   Collection Time: 11/06/21  3:40 PM   Specimen: Urine, Random  Result Value Ref Range Status   Specimen Description URINE, RANDOM  Final   Special Requests NONE   Final   Culture   Final    NO GROWTH Performed at [facility], [place] [address] [place], [place] [address]    Report Status 11/07/2021 FINAL  Final    Coagulation Studies: No results for input(s): LABPROT, INR in the last 72 hours.  Urinalysis: No results for input(s): COLORURINE, LABSPEC, PHURINE, GLUCOSEU, HGBUR, BILIRUBINUR, KETONESUR, PROTEINUR, UROBILINOGEN, NITRITE, LEUKOCYTESUR in the last 72 hours.  Invalid input(s): APPERANCEUR    Imaging: DG CHEST PORT 1 VIEW  Result Date: 11/12/2021 CLINICAL DATA:  Intubation EXAM: PORTABLE CHEST 1 VIEW COMPARISON:  Yesterday FINDINGS: Endotracheal tube with tip at the carina. Feeding tube at least reaches the stomach. Chest tubes in place. Swan-Ganz catheter with tip at the right lower lobe or inter lobar pulmonary artery. Biventricular pacer. Postoperative heart with stable enlargement. Diffuse hazy appearance of the chest likely pleural fluid, atelectasis, edema. Trace right apical pneumothorax These results will be called to the ordering clinician or representative by the Radiologist Assistant, and communication documented in the PACS or [employer]. IMPRESSION: 1. Low endotracheal tube, tip at the carina. 2. Continued diffuse haziness of the chest from edema/atelectasis. 3. Trace right apical pneumothorax. Electronically Signed   By: Jorje Guild M.D.   On: 11/12/2021 07:12     Medications:     prismasol BGK 4/2.5 800 mL/hr at 11/13/21 0100    prismasol BGK 4/2.5 400 mL/hr at 11/13/21 0705   sodium chloride 20 mL/hr at 11/07/21 1800   sodium chloride     sodium chloride 5 mL/hr at 11/10/21 0800   sodium chloride     sodium chloride Stopped (11/12/21 1932)   amiodarone 30 mg/hr (11/13/21 0700)   epinephrine 4 mcg/min (11/13/21 0700)   epoprostenol (VELETRI) for inhalation 1.38m/50mL (30,000 ng/mL) 50 ng/kg/min (11/13/21 0612)   feeding supplement (VITAL AF 1.2 CAL) 1,000 mL (11/12/21 1600)   fentaNYL infusion  INTRAVENOUS 200 mcg/hr (11/13/21 0700)   heparin 10,000 units/ 20 mL infusion syringe 500 Units/hr (11/13/21 0728)   lactated ringers 5 mL/hr at 11/13/21 0700   midazolam 6 mg/hr (11/13/21 0729)   norepinephrine (LEVOPHED) Adult infusion 15 mcg/min (11/13/21 0700)   prismasol BGK 4/2.5 1,500 mL/hr at 11/13/21 0738   sodium phosphate  Dextrose 5% IVPB 15 mmol (11/13/21 0737)   vasopressin 0.04 Units/min (11/13/21 0706)    acetaminophen (TYLENOL) oral liquid 160 mg/5 mL  650 mg Per Tube Q6H   artificial tears  1 application. Both Eyes Q8H   aspirin EC  325 mg Oral Daily   Or   aspirin  324 mg Per Tube Daily   atorvastatin  10 mg Per Tube q AM   bisacodyl  10 mg Oral Daily   Or   bisacodyl  10 mg Rectal Daily   buPROPion  75 mg Per Tube BID   chlorhexidine gluconate (MEDLINE KIT)  15 mL Mouth Rinse BID   Chlorhexidine Gluconate Cloth  6 each Topical Daily   docusate  100 mg Per Tube BID   feeding supplement (PROSource TF)  45 mL Per Tube BID   insulin aspart  0-24 Units Subcutaneous Q4H  insulin detemir  8 Units Subcutaneous BID   mouth rinse  15 mL Mouth Rinse 10 times per day   metoCLOPramide (REGLAN) injection  5 mg Intravenous Q6H   neostigmine  0.25 mg Subcutaneous Q6H   pantoprazole sodium  40 mg Per Tube Daily   polyethylene glycol  17 g Per Tube Daily   sennosides  10 mL Per Tube BID   sodium chloride flush  10-40 mL Intracatheter Q12H   sodium chloride flush  3 mL Intravenous Q12H   venlafaxine  75 mg Per Tube BID WC   sodium chloride, Place/Maintain arterial line **AND** sodium chloride, sodium chloride, fentaNYL, heparin, lip balm, metoprolol tartrate, midazolam, ondansetron (ZOFRAN) IV, rocuronium bromide, sodium chloride, sodium chloride flush, sodium chloride flush  Assessment/ Plan:  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  Started on CRRT 11/08/2021 with right femoral temporal dialysis catheter placed.  Continue on CRRT.  Patient required multiple  pressors ANEMIA-no ESA's at this point. MBD-appears stable HTN/VOL-continue ultrafiltration.  100 cc an hour ACCESS-femoral dialysis catheter Hypophosphatemia we will add sodium phosphate    LOS: [place] _0 _1 :52 AM

## 2021-11-13 NOTE — Progress Notes (Signed)
9 Days Post-Op Procedure(s) (LRB): ?TRICUSPID VALVE REPAIR USING EDWARDS MC3 TRICUSPID ANNULOPLASTY RING SIZE 28MM (N/A) ?TRANSESOPHAGEAL ECHOCARDIOGRAM (TEE) (N/A) ?Subjective: ?Remains intubated ? ? ?Objective: ?Vital signs in last 24 hours: ?Temp:  [97.5 ?F (36.4 ?C)-99.3 ?F (37.4 ?C)] 99 ?F (37.2 ?C) (03/09 1229) ?Pulse Rate:  [79-85] 80 (03/09 1200) ?Cardiac Rhythm: Ventricular paced (03/09 0800) ?Resp:  [18-32] 32 (03/09 1200) ?BP: (77-104)/(32-55) 92/44 (03/09 1200) ?SpO2:  [74 %-97 %] 96 % (03/09 1200) ?Arterial Line BP: (97-108)/(46-53) 99/48 (03/09 1200) ?FiO2 (%):  [90 %-100 %] 90 % (03/09 1151) ?Weight:  [98 kg] 98 kg (03/09 0400) ? ?Hemodynamic parameters for last 24 hours: ?PAP: (52-61)/(26-35) 56/30 ?CVP:  [17 mmHg-23 mmHg] 22 mmHg ?PCWP:  [24 mmHg] 24 mmHg ?CO:  [4.5 L/min-5.5 L/min] 4.6 L/min ?CI:  [2.3 L/min/m2-2.8 L/min/m2] 2.4 L/min/m2 ? ?Intake/Output from previous day: ?03/08 0701 - 03/09 0700 ?In: 4117.9 [I.V.:1974.4; NG/GT:1530; IV Piggyback:613.4] ?Out: 6602 [Chest Tube:40] ?Intake/Output this shift: ?Total I/O ?In: 1059.1 [P.O.:200; I.V.:397.8; NG/GT:275; IV Piggyback:186.4] ?Out: 1257 [Other:1257] ? ?General appearance: ill appearing ?Neurologic: sedated ?Heart: regular rate and rhythm ?Lungs: rhonchi bilaterally ?Extremities: edema severe ? ?Lab Results: ?Recent Labs  ?  11/12/21 ?1610 11/12/21 ?0859 11/13/21 ?0343 11/13/21 ?0706 11/13/21 ?0801 11/13/21 ?1019  ?WBC 16.1*  --  14.5*  --   --   --   ?HGB 7.7*   < > 7.2*   < > 9.5* 9.2*  ?HCT 26.5*   < > 25.9*   < > 28.0* 27.0*  ?PLT 152  --  161  --   --   --   ? < > = values in this interval not displayed.  ? ?BMET:  ?Recent Labs  ?  11/12/21 ?1615 11/12/21 ?2136 11/13/21 ?0330 11/13/21 ?0706 11/13/21 ?0801 11/13/21 ?1019  ?NA 135   < > 136   < > 137 136  ?K 4.6   < > 4.6   < > 4.7 4.5  ?CL 101  --  102  --   --   --   ?CO2 25  --  23  --   --   --   ?GLUCOSE 152*  --  167*  --   --   --   ?BUN 16  --  15  --   --   --   ?CREATININE 0.81   --  0.81  --   --   --   ?CALCIUM 8.0*  --  8.3*  --   --   --   ? < > = values in this interval not displayed.  ?  ?PT/INR: No results for input(s): LABPROT, INR in the last 72 hours. ?ABG ?   ?Component Value Date/Time  ? PHART 7.261 (L) 11/13/2021 1019  ? HCO3 23.7 11/13/2021 1019  ? TCO2 25 11/13/2021 1019  ? ACIDBASEDEF 4.0 (H) 11/13/2021 1019  ? O2SAT 98 11/13/2021 1019  ? ?CBG (last 3)  ?Recent Labs  ?  11/13/21 ?0342 11/13/21 ?0758 11/13/21 ?1124  ?GLUCAP 167* 147* 199*  ? ? ?Assessment/Plan: ?S/P Procedure(s) (LRB): ?TRICUSPID VALVE REPAIR USING EDWARDS MC3 TRICUSPID ANNULOPLASTY RING SIZE 28MM (N/A) ?TRANSESOPHAGEAL ECHOCARDIOGRAM (TEE) (N/A) ?Remains critically ill  ?NEURO- remains sedated ?CV- in Sr, still on norpei, epi, vasopressin ? Amidarone drip ? R to L shunt by echo ?RESP- VDRF with high Aa gradient ? Vent per CCM. Now on 90% and 18 PEEP ? CXR still shows bilateral AS opacities/ edema ?RENAL- acute renal failure- on CRRT ? Weight  down 20 pounds over past 4 days but still massively fluid overloaded ? Plan per Nephrology ?ID- s/p 5 days of Zosyn ? WBC trending down ?ENDO- CBG moderately elevated- continue insulin ?GI- on tube feeds ?D/w CCM and AHF ? LOS: 17 days  ? ? ?Melrose Nakayama ?11/13/2021 ? ? ?

## 2021-11-13 NOTE — Plan of Care (Signed)

## 2021-11-13 NOTE — Progress Notes (Signed)
? ?NAME:  [name], MRN:  [id number], DOB:  1955-10-22, LOS: 41 ?ADMISSION DATE:  10/11/2021, CONSULTATION DATE:  11/06/21 ?REFERRING MD:  [name], CHIEF COMPLAINT:  hypoxia  ? ?History of Present Illness:  ?[name] is a 66 year old woman with a history of HFrEF and paroxysmal atrial fibrillation who was transferred to [facility] with GI bleed and pericardial effusion.  She had recently undergone watchman insertion in January 2023.  She underwent EGD which did not explain bleeding.  Colonoscopy demonstrated sessile polyps, which were removed in the ascending colon and areas of ischemic colonic ulcers.  There was moderate sigmoid diverticulosis.  Heart catheterization demonstrated normal coronary arteries with equalization of left and right-sided heart pressures, severely reduced PAPi due to severe tricuspid regurgitation.  Swan-Ganz catheter was left in place to assist with weaning inotropic support.  Echocardiograms have previously demonstrated reduced RV function and dilation. On 2/28 she underwent Tricuspid repair and ring annuplasty. Post-op day 1 she was extubated but developed rapidly worsening respiratory failure overnight on 3/2 requiring re-intubation by anesthesia. CXR post-intubation had bilateral infiltrates. ? 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 ?Significant Hospital Events: ?Including procedures, antibiotic start and stop dates in addition to other pertinent events   ? ?2/20 admitted for pericardial effusion without tamponade ?2/22 L&R heart cath ?2/24 EGD & colonoscopy ?2/28 tricuspid valve repair; Vt arrest coming off bypass, back on bypass to stabilize ?3/1 extubated post op, reintubated overnight for hypoxia ?3/2 PCCM consulted for vent management.  Started on vancomycin and Zosyn for presumed aspiration pneumonia ?3/3 remains in a mixed picture of cardiogenic  and septic shock.  ?3/4 started on CRRT ?3/5>> improving pressor needs, stubborn O2 needs ? ?Interim History / Subjective:  ?Remains on high vent settings, intermittent NMB needed for synchrony otherwise desats. ?Heavily sedated for same reason. ? ?Objective   ?Blood pressure (!) 98/48, pulse 80, temperature 98.4 ?F (36.9 ?C), resp. rate (!) 32, height '5\' 2"'$  (1.575 m), weight 98 kg, SpO2 96 %. ?PAP: (52-61)/(26-35) 56/31 ?CVP:  [17 mmHg-23 mmHg] 22 mmHg ?PCWP:  [24 mmHg] 24 mmHg ?CO:  [4.5 L/min-5.5 L/min] 4.7 L/min ?CI:  [2.3 L/min/m2-2.8 L/min/m2] 2.4 L/min/m2  ?Vent Mode: PRVC ?FiO2 (%):  [90 %-100 %] 90 % ?Set Rate:  [28 bmp-32 bmp] 32 bmp ?Vt Set:  [400 mL] 400 mL ?PEEP:  [16 cmH20-18 cmH20] 16 cmH20 ?Plateau Pressure:  [34 MAU63-33 cmH20] 39 cmH20  ? ?Intake/Output Summary (Last 24 hours) at 11/13/2021 1208 ?Last data filed at 11/13/2021 1051 ?Gross per 24 hour  ?Intake 3713.18 ml  ?Output 6027 ml  ?Net -2313.82 ml  ? ? ?Filed Weights  ? 11/11/21 0400 11/12/21 0400 11/13/21 0400  ?Weight: 101.5 kg 99.8 kg 98 kg  ? ? ?Examination: ?No distress ?Improving anasarca ?Stable mild ischemic changes on L toes ?Sternotomy site looks okay, drains in place, minimal output ?RASS -5 ?Lungs with diminished sounds bases, rhonci at apices ? ?Acidemic despite maximal safe MV settings on vent ?SvO2 58% ?Phos low being repleted ?Remains on veletri@ 50ng/kg/min ?H/H down slightly again, 7.2 g/dL ?Chemistries good on CRRT ? ?Resolved Hospital Problem list   ? ? 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  TVR complicated by VT arrest coming off pump, brief. ?Fluid overloaded state of heart. ?Renal failure with oliguria- septic ATN and cardiorenal now on  CRRT ?Postoperative mixed cardiogenic and septic shock- improved but still severe ?Pericardial effusion moderate without tamponade.  Seems better on more recent echo. ?Chronic AF, hx NSVT, AICD in place ?Ischemic colitis- with ABLA, was resolved,.  Had nonbloody BM 3/4. ?HLD, GERD, OSA ontolerant to BIPAP ?Hx breast ca 1998 with exposure to adriamycin ?Postop ileus- again an issue, resuming neostigmine ?DM - look okay with current regimen ?ETT balloon leak- too dangerous to change out at present, only 20cc's leak ? ?- CRRT with pull per TCTS and CHF teams ?- Continue vanc/zosyn, 7 day duration ?- Titrate pressors to MAP 65 ?- Reglan/neostigmine/dulcolax/miralax; add dose of relistor and smog enema today ?- Lung protective ventilation on ARDSnet protocol, allow permissive hypercapnea, limit driving pressures, continue veletri with sedation/NMB titrated to vent synchrony; once FiO2 and PEEP are more reasonable will consider veletri wean ?- Continue to work toward euvolemia, remains 12 kg above dry weight ?- Continue aggressive care, family aware of guarded prognosis ? ?Best Practice (right click and "Reselect all SmartList Selections" daily)  ? ?Diet/type: tubefeeds ?DVT prophylaxis: SCD ?GI prophylaxis: PPI ?Lines: Central line and Arterial Line ?Foley:  Yes, and it is still needed ?Code Status:  full code ?Last date of multidisciplinary goals of care discussion [3/6] ? ? ?Patient critically ill due to multiorgan failure ?Interventions to address this today vent/CRRT/pressor titration ?Risk of deterioration without these interventions is high ? ?I personally spent 38 minutes providing critical care not including any separately billable procedures ? ?Natasha Emery MD ?[facility] ? ?Prefer epic messenger for cross cover needs ?If after hours, please call E-link ? ? ?

## 2021-11-13 NOTE — Progress Notes (Signed)
Patient ID: [name], female   DOB: Aug 29, 1956, 66 y.o.   MRN: [id number] ? ?TCTS Evening Rounds: ? ?CI 2.4 on 4 Epi, 15, NE, Vaso 0.04  Co-ox 68% this afternoon. ? ?Vent on 90% FiO2, 16 PEEP with sats low 90's. ? ?Remains on CRRT  -650 cc today so far. ?

## 2021-11-13 NOTE — Progress Notes (Signed)
Nutrition Follow-up ? ?DOCUMENTATION CODES:  ? ?Not applicable ? ?INTERVENTION:  ? ?Tube Feeding via Cortrak:  ?Vital 1.5 at 50 ml/hr ?Pro-Source TF 45 mL TID ?Provides 1920 kcals, 114 g of protein, 912 mL of free water ? ?Add B complex with C, Folic Acid to replace losses while on CRRT ? ?NUTRITION DIAGNOSIS:  ? ?Inadequate oral intake related to acute illness as evidenced by NPO status. ? ?Being addressed via TF  ? ?GOAL:  ? ?Patient will meet greater than or equal to 90% of their needs ? ?Met via TF  ? ?MONITOR:  ? ?Vent status, Labs, Weight trends, TF tolerance ? ?REASON FOR ASSESSMENT:  ? ?Ventilator, Consult ?Enteral/tube feeding initiation and management ? ?ASSESSMENT:  ? ?66 yo female admitted with pericardial effusion, GI bleed.Pt with severe tricuspid regurgitation s/p TVR, cardiogenic shock, acute respiratory failure with ARDS secondary requiring intubation.  PMH includes CKD III/IV, CHF, HLD, GERD ? ?2/20 Transferred to [facility] for GI bleed, pericardial effusion ?2/24 EGD, Colonoscopy: right colon ischemic ulcers, polyps ?2/28 s/p TVR ?3/01 Extubated ?3/02 Re-Intubated ?3/04 CRRT initiated, severe abdominal distention on abd xray ? ?Pt remains intubated and sedated on 100% FiO2, requiring heavy sedation, NMB prn for vent dyssynchrony ?Pt remains on pressors, requirements down-levophed (15), epinephrine (4), vasopressin (0.04) ? ?Vital AF 1.2 at 55 ml/hr via Cortrak tube ? ?Received relistor x 1  and SMOG enema today, +some results ? ?Significant edema on exam-improving on CRRT; weight 98 kg ? ?No documented skin breakdown ? ?Labs: phosphorus 2.1 (L)-sodium phosphate received ?Meds: colace, dulcolax, miralax, ss novolog, levemir, reglan ? ?Diet Order:   ?Diet Order   ? ? None  ? ?  ? ? ?EDUCATION NEEDS:  ? ?Not appropriate for education at this time ? ?Skin:  Skin Assessment: Reviewed RN Assessment ? ?Last BM:  3/9 ? ?Height:  ? ?Ht Readings from Last 1 Encounters:  ?10/18/2021 5' 2" (1.575 m)  ? ? ?Weight:   ? ?Wt Readings from Last 1 Encounters:  ?11/13/21 98 kg  ? ? ?BMI:  Body mass index is 39.52 kg/m?. ? ?Estimated Nutritional Needs:  ? ?Kcal:  1750-2000 kcals ? ?Protein:  100-120 g ? ?Fluid:  1.5 L ? ? ?Kerman Passey MS, RDN, LDN, CNSC ?Registered Dietitian III ?Clinical Nutrition ?RD Pager and On-Call Pager Number Located in [place]  ? ?

## 2021-11-14 DIAGNOSIS — I3139 Other pericardial effusion (noninflammatory): Secondary | ICD-10-CM | POA: Diagnosis not present

## 2021-11-14 LAB — TYPE AND SCREEN
ABO/RH(D): A NEG
Antibody Screen: NEGATIVE
Unit division: 0

## 2021-11-14 LAB — RENAL FUNCTION PANEL
Albumin: 2.4 g/dL — ABNORMAL LOW (ref 3.5–5.0)
Albumin: 2.3 g/dL — ABNORMAL LOW (ref 3.5–5.0)
Anion gap: 11 (ref 5–15)
Anion gap: 12 (ref 5–15)
BUN: 20 mg/dL (ref 8–23)
BUN: 17 mg/dL (ref 8–23)
CO2: 22 mmol/L (ref 22–32)
CO2: 23 mmol/L (ref 22–32)
Calcium: 8.4 mg/dL — ABNORMAL LOW (ref 8.9–10.3)
Calcium: 8.7 mg/dL — ABNORMAL LOW (ref 8.9–10.3)
Chloride: 100 mmol/L (ref 98–111)
Chloride: 99 mmol/L (ref 98–111)
Creatinine, Ser: 0.82 mg/dL (ref 0.44–1.00)
Creatinine, Ser: 0.79 mg/dL (ref 0.44–1.00)
GFR, Estimated: >60 mL/min (ref >60)
GFR, Estimated: >60 mL/min (ref >60)
Glucose, Bld: 173 mg/dL — ABNORMAL HIGH (ref 70–99)
Glucose, Bld: 180 mg/dL — ABNORMAL HIGH (ref 70–99)
Phosphorus: 2.2 mg/dL — ABNORMAL LOW (ref 2.5–4.6)
Phosphorus: 2.9 mg/dL (ref 2.5–4.6)
Potassium: 4.4 mmol/L (ref 3.5–5.1)
Potassium: 4.5 mmol/L (ref 3.5–5.1)
Sodium: 133 mmol/L — ABNORMAL LOW (ref 135–145)
Sodium: 134 mmol/L — ABNORMAL LOW (ref 135–145)

## 2021-11-14 LAB — CBC WITH DIFFERENTIAL/PLATELET
Abs Immature Granulocytes: 0 10*3/uL (ref 0.00–0.07)
Basophils Absolute: 0.2 10*3/uL — ABNORMAL HIGH (ref 0.0–0.1)
Basophils Relative: 1 %
Eosinophils Absolute: 0.2 10*3/uL (ref 0.0–0.5)
Eosinophils Relative: 1 %
HCT: 28.9 % — ABNORMAL LOW (ref 36.0–46.0)
Hemoglobin: 8.4 g/dL — ABNORMAL LOW (ref 12.0–15.0)
Lymphocytes Relative: 5 %
Lymphs Abs: 0.8 10*3/uL (ref 0.7–4.0)
MCH: 28.1 pg (ref 26.0–34.0)
MCHC: 29.1 g/dL — ABNORMAL LOW (ref 30.0–36.0)
MCV: 96.7 fL (ref 80.0–100.0)
Monocytes Absolute: 0.8 10*3/uL (ref 0.1–1.0)
Monocytes Relative: 5 %
Neutro Abs: 14.8 10*3/uL — ABNORMAL HIGH (ref 1.7–7.7)
Neutrophils Relative %: 88 %
Platelets: 169 10*3/uL (ref 150–400)
RBC: 2.99 MIL/uL — ABNORMAL LOW (ref 3.87–5.11)
RDW: 21.9 % — ABNORMAL HIGH (ref 11.5–15.5)
WBC: 16.8 10*3/uL — ABNORMAL HIGH (ref 4.0–10.5)
nRBC: 23.2 % — ABNORMAL HIGH (ref 0.0–0.2)
nRBC: 28 /100 WBC — ABNORMAL HIGH

## 2021-11-14 LAB — COOXEMETRY PANEL
Carboxyhemoglobin: 3.5 % — ABNORMAL HIGH (ref 0.5–1.5)
Carboxyhemoglobin: 2.3 % — ABNORMAL HIGH (ref 0.5–1.5)
Carboxyhemoglobin: 2.7 % — ABNORMAL HIGH (ref 0.5–1.5)
Carboxyhemoglobin: 1.8 % — ABNORMAL HIGH (ref 0.5–1.5)
Methemoglobin: 1.5 % (ref 0.0–1.5)
Methemoglobin: 0.9 % (ref 0.0–1.5)
Methemoglobin: 0.8 % (ref 0.0–1.5)
Methemoglobin: 1.5 % (ref 0.0–1.5)
O2 Saturation: 92.8 %
O2 Saturation: 55.3 %
O2 Saturation: 50.3 %
O2 Saturation: 70.2 %
Total hemoglobin: 8.7 g/dL — ABNORMAL LOW (ref 12.0–16.0)
Total hemoglobin: 8.4 g/dL — ABNORMAL LOW (ref 12.0–16.0)
Total hemoglobin: 8.1 g/dL — ABNORMAL LOW (ref 12.0–16.0)
Total hemoglobin: 8.7 g/dL — ABNORMAL LOW (ref 12.0–16.0)

## 2021-11-14 LAB — GLUCOSE, CAPILLARY
Glucose-Capillary: 182 mg/dL — ABNORMAL HIGH (ref 70–99)
Glucose-Capillary: 134 mg/dL — ABNORMAL HIGH (ref 70–99)
Glucose-Capillary: 127 mg/dL — ABNORMAL HIGH (ref 70–99)
Glucose-Capillary: 176 mg/dL — ABNORMAL HIGH (ref 70–99)
Glucose-Capillary: 214 mg/dL — ABNORMAL HIGH (ref 70–99)
Glucose-Capillary: 233 mg/dL — ABNORMAL HIGH (ref 70–99)

## 2021-11-14 LAB — BPAM RBC
Blood Product Expiration Date: 202303302359
ISSUE DATE / TIME: 202303091029
Unit Type and Rh: 600

## 2021-11-14 LAB — MAGNESIUM: Magnesium: 2.4 mg/dL (ref 1.7–2.4)

## 2021-11-14 LAB — APTT: aPTT: 38 seconds — ABNORMAL HIGH (ref 24–36)

## 2021-11-14 LAB — HEPARIN LEVEL (UNFRACTIONATED): Heparin Unfractionated: <0.1 IU/mL — ABNORMAL LOW (ref 0.30–0.70)

## 2021-11-14 MED ORDER — SODIUM PHOSPHATES 45 MMOLE/15ML IV SOLN
15.0000 mmol | Freq: Once | INTRAVENOUS | Status: AC
  Administered 2021-11-14: 15 mmol via INTRAVENOUS
  Filled 2021-11-14: qty 5

## 2021-11-14 MED ORDER — INSULIN DETEMIR 100 UNIT/ML ~~LOC~~ SOLN
10.0000 [IU] | Freq: Two times a day (BID) | SUBCUTANEOUS | Status: DC
  Administered 2021-11-14 – 2021-11-17 (×7): 10 [IU] via SUBCUTANEOUS
  Filled 2021-11-14 (×9): qty 0.1

## 2021-11-14 NOTE — Progress Notes (Signed)
[facility] ROUNDING NOTE   Subjective:   Interval History: Stage III/IV chronic kidney disease setting of severe ARDS with severe shock septic/cardiogenic status post TVR 11/05/2020.  Placed on CRRT for volume control.  Hemodynamics stable on epinephrine, norepinephrine and vasopressin.  Amiodarone drip for atrial fibrillation  Blood pressure 101/47 pulse 80 temperature 98 O2 sats 96% FiO2 70%  Negative fluid balance as tolerated  Sodium 133 potassium 4.4 chloride 100 CO2 22 BUN 20 creatinine 0.82 glucose 172 calcium 8.4 phosphorus 2.2 magnesium 2.4 albumin 2.4 hemoglobin 8.4   Objective:  Vital signs in last 24 hours:  Temp:  [97.5 F (36.4 C)-100.8 F (38.2 C)] 98.4 F (36.9 C) (03/10 0700) Pulse Rate:  [80-85] 80 (03/10 0802) Resp:  [20-32] 32 (03/10 0802) BP: (92-112)/(30-54) 101/47 (03/10 0802) SpO2:  [92 %-99 %] 96 % (03/10 0802) Arterial Line BP: (98-119)/(45-58) 105/49 (03/10 0700) FiO2 (%):  [70 %-100 %] 70 % (03/10 0802) Weight:  [95.6 kg] 95.6 kg (03/10 0232)  Weight change: -2.4 kg Filed Weights   11/12/21 0400 11/13/21 0400 11/14/21 0232  Weight: 99.8 kg 98 kg 95.6 kg    Intake/Output: I/O last 3 completed shifts: In: 5808.3 [P.O.:200; I.V.:2889.6; Blood:315; NG/GT:2080; IV Piggyback:323.7] Out: 9480 [Other:9383; Chest Tube:50]   Intake/Output this shift:  No intake/output data recorded.  Critically ill intubated sedated CVS- RRR ET tube and core track RS- CTA JVP elevated intubated ABD- BS present soft non-distended EXT- no edema   Basic Metabolic Panel: Recent Labs  Lab 11/10/21 0431 11/10/21 1001 11/11/21 0312 11/11/21 0403 11/12/21 0358 11/12/21 0859 11/12/21 1615 11/12/21 2136 11/13/21 0330 11/13/21 0706 11/13/21 0801 11/13/21 1019 11/13/21 1600 11/14/21 0231  NA 136   < > 132*   < > 133*   < > 135   < > 136 137 137 136 136 133*  K 4.2   < > 4.5   < > 3.9   < > 4.6   < > 4.6 4.7 4.7 4.5 4.2 4.4  CL 102   < > 101   <  > 100  --  101  --  102  --   --   --  101 100  CO2 24   < > 22   < > 23  --  25  --  23  --   --   --  23 22  GLUCOSE 153*   < > 198*   < > 148*  --  152*  --  167*  --   --   --  157* 173*  BUN 16   < > 18   < > 18  --  16  --  15  --   --   --  17 20  CREATININE 0.94   < > 0.82   < > 0.75  --  0.81  --  0.81  --   --   --  0.86 0.82  CALCIUM 7.6*   < > 7.6*   < > 7.9*  --  8.0*  --  8.3*  --   --   --  8.4* 8.4*  MG 2.2  --  2.4  --  2.4  --   --   --  2.4  --   --   --   --  2.4  PHOS 2.1*   < > 1.4*   < > 1.1*  --  2.7  --  2.1*  --   --   --  2.8 2.2*   < > = values in this interval not displayed.     Liver Function Tests: Recent Labs  Lab 11/08/21 0422 11/08/21 1533 11/09/21 0408 11/09/21 1600 11/10/21 0431 11/10/21 1555 11/11/21 0312 11/11/21 1554 11/12/21 0358 11/12/21 1615 11/13/21 0330 11/13/21 1600 11/14/21 0231  AST 87*  --  61*  --  53*  --  61*  --   --   --   --   --   --   ALT 8  --  8  --  8  --  12  --   --   --   --   --   --   ALKPHOS 192*  --  236*  --  335*  --  429*  --   --   --   --   --   --   BILITOT 1.2  --  1.0  --  1.2  --  1.2  --   --   --   --   --   --   PROT 4.8*  --  5.0*  --  5.2*  --  5.7*  --   --   --   --   --   --   ALBUMIN 1.8*   < > 1.7*   < > 1.6*   < > 1.7*   < > 2.8* 3.0* 2.7* 2.6* 2.4*   < > = values in this interval not displayed.    No results for input(s): LIPASE, AMYLASE in the last 168 hours. Recent Labs  Lab 11/11/21 0312  AMMONIA 62*     CBC: Recent Labs  Lab 11/10/21 0431 11/10/21 1001 11/11/21 0312 11/11/21 0403 11/12/21 0358 11/12/21 0859 11/13/21 0343 11/13/21 0706 11/13/21 0801 11/13/21 1019 11/14/21 0231  WBC 17.7*  --  20.7*  --  16.1*  --  14.5*  --   --   --  16.8*  NEUTROABS  --   --  17.2*  --   --   --   --   --   --   --  14.8*  HGB 8.9*   < > 9.0*   < > 7.7*   < > 7.2* 9.2* 9.5* 9.2* 8.4*  HCT 28.0*   < > 29.6*   < > 26.5*   < > 25.9* 27.0* 28.0* 27.0* 28.9*  MCV 89.5  --  91.6  --   96.0  --  100.0  --   --   --  96.7  PLT 120*  --  161  --  152  --  161  --   --   --  169   < > = values in this interval not displayed.     Cardiac Enzymes: No results for input(s): CKTOTAL, CKMB, CKMBINDEX, TROPONINI in the last 168 hours.  BNP: Invalid input(s): POCBNP  CBG: Recent Labs  Lab 11/13/21 1631 11/13/21 1959 11/13/21 2328 11/14/21 0311 11/14/21 0738  GLUCAP 163* 188* 171* 182* 134*     Microbiology: Results for orders placed or performed during the hospital encounter of 10/20/2021  MRSA Next Gen by PCR, Nasal     Status: None   Collection Time: 10/24/2021  8:41 PM   Specimen: Nasal Mucosa; Nasal Swab  Result Value Ref Range Status   MRSA by PCR Next Gen NOT DETECTED NOT DETECTED Final    Comment: (NOTE) The GeneXpert MRSA Assay (FDA approved for NASAL specimens only), is one component of a comprehensive  MRSA colonization surveillance program. It is not intended to diagnose MRSA infection nor to guide or monitor treatment for MRSA infections. Test performance is not FDA approved in patients less than 32 years old. Performed at [facility], [place] [address] [place], [place] [address]   Resp Panel by RT-PCR (Flu A&B, Covid) Nasopharyngeal Swab     Status: None   Collection Time: 10/26/2021  9:13 PM   Specimen: Nasopharyngeal Swab; Nasopharyngeal(NP) swabs in vial transport medium  Result Value Ref Range Status   SARS Coronavirus 2 by RT PCR NEGATIVE NEGATIVE Final    Comment: (NOTE) SARS-CoV-2 target nucleic acids are NOT DETECTED.  The SARS-CoV-2 RNA is generally detectable in upper respiratory specimens during the acute phase of infection. The lowest concentration of SARS-CoV-2 viral copies this assay can detect is 138 copies/mL. A negative result does not preclude SARS-Cov-2 infection and should not be used as the sole basis for treatment or other patient management decisions. A negative result may occur with  improper specimen  collection/handling, submission of specimen other than nasopharyngeal swab, presence of viral mutation(s) within the areas targeted by this assay, and inadequate number of viral copies(<138 copies/mL). A negative result must be combined with clinical observations, patient history, and epidemiological information. The expected result is Negative.  Fact Sheet for Patients:  EntrepreneurPulse.com.au  Fact Sheet for Healthcare Providers:  IncredibleEmployment.be  This test is no t yet approved or cleared by the [place] FDA and  has been authorized for detection and/or diagnosis of SARS-CoV-2 by FDA under an Emergency Use Authorization (EUA). This EUA will remain  in effect (meaning this test can be used) for the duration of the COVID-19 declaration under Section 564(b)(1) of the Act, 21 U.S.C.section 360bbb-3(b)(1), unless the authorization is terminated  or revoked sooner.       Influenza A by PCR NEGATIVE NEGATIVE Final   Influenza B by PCR NEGATIVE NEGATIVE Final    Comment: (NOTE) The Xpert Xpress SARS-CoV-2/FLU/RSV plus assay is intended as an aid in the diagnosis of influenza from Nasopharyngeal swab specimens and should not be used as a sole basis for treatment. Nasal washings and aspirates are unacceptable for Xpert Xpress SARS-CoV-2/FLU/RSV testing.  Fact Sheet for Patients: EntrepreneurPulse.com.au  Fact Sheet for Healthcare Providers: IncredibleEmployment.be  This test is not yet approved or cleared by the [place] FDA and has been authorized for detection and/or diagnosis of SARS-CoV-2 by FDA under an Emergency Use Authorization (EUA). This EUA will remain in effect (meaning this test can be used) for the duration of the COVID-19 declaration under Section 564(b)(1) of the Act, 21 U.S.C. section 360bbb-3(b)(1), unless the authorization is terminated or revoked.  Performed at [facility], [place] [address], [place], [place] [address]   Culture, blood (routine x 2)     Status: None   Collection Time: 10/28/21  4:48 PM   Specimen: BLOOD  Result Value Ref Range Status   Specimen Description BLOOD SHOULDER RIGHT  Final   Special Requests   Final    BOTTLES DRAWN AEROBIC AND ANAEROBIC Blood Culture adequate volume   Culture   Final    NO GROWTH 5 DAYS Performed at [facility], [place] [address] [place], [place] [address]    Report Status 11/02/2021 FINAL  Final  Culture, blood (routine x 2)     Status: None   Collection Time: 10/28/21  5:01 PM   Specimen: BLOOD  Result Value Ref Range Status  Specimen Description BLOOD RIGHT ANTECUBITAL  Final   Special Requests   Final    BOTTLES DRAWN AEROBIC AND ANAEROBIC Blood Culture results may not be optimal due to an inadequate volume of blood received in culture bottles   Culture   Final    NO GROWTH 5 DAYS Performed at [facility], [place] [address], [place], [place] [address]    Report Status 11/02/2021 FINAL  Final  Surgical pcr screen     Status: None   Collection Time: 11/01/21  4:00 PM   Specimen: Nasal Mucosa; Nasal Swab  Result Value Ref Range Status   MRSA, PCR NEGATIVE NEGATIVE Final   Staphylococcus aureus NEGATIVE NEGATIVE Final    Comment: (NOTE) The Xpert SA Assay (FDA approved for NASAL specimens in patients 33 years of age and older), is one component of a comprehensive surveillance program. It is not intended to diagnose infection nor to guide or monitor treatment. Performed at [facility], [place] [address], [place], [place] [address]   MRSA Next Gen by PCR, Nasal     Status: None   Collection Time: 11/06/21  7:10 AM   Specimen: Nasal Mucosa; Nasal Swab  Result Value Ref Range Status   MRSA by PCR Next Gen NOT DETECTED NOT DETECTED Final    Comment: (NOTE) The GeneXpert MRSA Assay (FDA approved for NASAL specimens only), is one component of a comprehensive MRSA colonization  surveillance program. It is not intended to diagnose MRSA infection nor to guide or monitor treatment for MRSA infections. Test performance is not FDA approved in patients less than 31 years old. Performed at [facility], [place] [address], [place], [place] [address]   Culture, Respiratory w Gram Stain     Status: None   Collection Time: 11/06/21 12:08 PM   Specimen: Tracheal Aspirate  Result Value Ref Range Status   Specimen Description TRACHEAL ASPIRATE  Final   Special Requests NONE  Final   Gram Stain   Final    ABUNDANT WBC PRESENT, PREDOMINANTLY PMN NO ORGANISMS SEEN    Culture   Final    RARE CANDIDA ALBICANS NO STAPHYLOCOCCUS AUREUS ISOLATED No Pseudomonas species isolated Performed at [facility], [place] [address], [place], [place] [address]    Report Status 11/09/2021 FINAL  Final  Culture, blood (Routine X 2) w Reflex to ID Panel     Status: None   Collection Time: 11/06/21  2:33 PM   Specimen: BLOOD  Result Value Ref Range Status   Specimen Description BLOOD RIGHT ANTECUBITAL  Final   Special Requests   Final    BOTTLES DRAWN AEROBIC AND ANAEROBIC Blood Culture adequate volume   Culture   Final    NO GROWTH 5 DAYS Performed at [facility], [place] [address] [place], [place] [address]    Report Status 11/11/2021 FINAL  Final  Culture, blood (Routine X 2) w Reflex to ID Panel     Status: None   Collection Time: 11/06/21  2:40 PM   Specimen: BLOOD RIGHT WRIST  Result Value Ref Range Status   Specimen Description BLOOD RIGHT WRIST  Final   Special Requests   Final    BOTTLES DRAWN AEROBIC AND ANAEROBIC Blood Culture adequate volume   Culture   Final    NO GROWTH 5 DAYS Performed at [facility], [place] [address] [place],  [address]    Report Status 11/11/2021 FINAL  Final  Urine Culture  Status: None   Collection Time: 11/06/21  3:40 PM   Specimen: Urine, Random  Result Value Ref Range Status   Specimen Description URINE, RANDOM   Final   Special Requests NONE  Final   Culture   Final    NO GROWTH Performed at [facility], [address] [place], [place] [address]    Report Status 11/07/2021 FINAL  Final    Coagulation Studies: No results for input(s): LABPROT, INR in the last 72 hours.  Urinalysis: No results for input(s): COLORURINE, LABSPEC, PHURINE, GLUCOSEU, HGBUR, BILIRUBINUR, KETONESUR, PROTEINUR, UROBILINOGEN, NITRITE, LEUKOCYTESUR in the last 72 hours.  Invalid input(s): APPERANCEUR    Imaging: DG Chest Port 1 View  Result Date: 11/13/2021 CLINICAL DATA:  Pulmonary edema. EXAM: PORTABLE CHEST 1 VIEW COMPARISON:  Prior chest radiographs 11/12/2021 and earlier. FINDINGS: An ET tube terminates 1.6 cm below the level of the carina. An enteric tube passes below the level of the left hemidiaphragm with tip excluded from the field of view. Stable position of multiple chest tubes. Right IJ approach introducer sheath with unchanged position of a Swan-Ganz catheter. Redemonstrated multi-lead implantable cardiac device. Prior median sternotomy and left atrial appendage occlusion. Unchanged cardiomegaly. Persistent bilateral interstitial and ill-defined airspace opacities throughout the bilateral lungs, likely reflecting pulmonary edema. Findings are similar as compared to the chest radiograph performed yesterday. No evidence of pleural effusion or pneumothorax. No acute bony abnormality identified. IMPRESSION: Position of lines and tubes, as described. Unchanged persistent bilateral interstitial and ill-defined airspace opacities throughout both lungs, likely reflecting pulmonary edema. Electronically Signed   By: Kellie Simmering D.O.   On: 11/13/2021 08:06     Medications:     prismasol BGK 4/2.5 800 mL/hr at 11/14/21 0333    prismasol BGK 4/2.5 400 mL/hr at 11/13/21 2130   sodium chloride 20 mL/hr at 11/07/21 1800   sodium chloride     sodium chloride 5 mL/hr at 11/10/21 0800   sodium chloride     sodium  chloride Stopped (11/12/21 1932)   amiodarone 30 mg/hr (11/14/21 0805)   epinephrine 4 mcg/min (11/14/21 0700)   epoprostenol (VELETRI) for inhalation 1.64m/50mL (30,000 ng/mL) 50 ng/kg/min (11/14/21 0602)   feeding supplement (VITAL 1.5 CAL) 50 mL/hr at 11/14/21 0700   fentaNYL infusion INTRAVENOUS 175 mcg/hr (11/14/21 0700)   heparin 10,000 units/ 20 mL infusion syringe 500 Units/hr (11/14/21 0700)   lactated ringers 5 mL/hr at 11/14/21 0700   midazolam 6 mg/hr (11/14/21 0700)   norepinephrine (LEVOPHED) Adult infusion 15 mcg/min (11/14/21 0807)   prismasol BGK 4/2.5 1,500 mL/hr at 11/14/21 0517   vasopressin 0.04 Units/min (11/14/21 0804)    acetaminophen (TYLENOL) oral liquid 160 mg/5 mL  650 mg Per Tube Q6H   artificial tears  1 application. Both Eyes Q8H   aspirin EC  325 mg Oral Daily   Or   aspirin  324 mg Per Tube Daily   atorvastatin  10 mg Per Tube q AM   B-complex with vitamin C  1 tablet Per Tube Daily   bisacodyl  10 mg Oral Daily   Or   bisacodyl  10 mg Rectal Daily   buPROPion  75 mg Per Tube BID   chlorhexidine gluconate (MEDLINE KIT)  15 mL Mouth Rinse BID   Chlorhexidine Gluconate Cloth  6 each Topical Daily   docusate  100 mg Per Tube BID   feeding supplement (PROSource TF)  45 mL Per Tube TID   folic acid  1 mg  Per Tube Daily   insulin aspart  0-24 Units Subcutaneous Q4H   insulin detemir  8 Units Subcutaneous BID   mouth rinse  15 mL Mouth Rinse 10 times per day   metoCLOPramide (REGLAN) injection  5 mg Intravenous Q6H   neostigmine  0.25 mg Subcutaneous Q6H   pantoprazole sodium  40 mg Per Tube Daily   polyethylene glycol  17 g Per Tube Daily   sennosides  10 mL Per Tube BID   sodium chloride flush  10-40 mL Intracatheter Q12H   sodium chloride flush  3 mL Intravenous Q12H   venlafaxine  75 mg Per Tube BID WC   sodium chloride, Place/Maintain arterial line **AND** sodium chloride, sodium chloride, fentaNYL, heparin, lip balm, midazolam, ondansetron  (ZOFRAN) IV, rocuronium bromide, sodium chloride, sodium chloride flush, sodium chloride flush  Assessment/ Plan:  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  Started on CRRT 11/08/2021 with right femoral temporal dialysis catheter placed.  Continue on CRRT.  Patient required multiple pressors ANEMIA-no ESA's at this point. MBD-appears stable HTN/VOL-continue ultrafiltration.  100 cc an hour ACCESS-femoral dialysis catheter Hypophosphatemia we will add sodium phosphate    LOS: [place] _0 _1 :08 AM

## 2021-11-14 NOTE — Progress Notes (Signed)
10 Days Post-Op Procedure(s) (LRB): ?TRICUSPID VALVE REPAIR USING EDWARDS MC3 TRICUSPID ANNULOPLASTY RING SIZE 28MM (N/A) ?TRANSESOPHAGEAL ECHOCARDIOGRAM (TEE) (N/A) ?Subjective: ?Intubated and sedated on vent 70% FiO2 and 16 PEEP ? ?MAP 67, CI 2.2, CVP 13, PA 47/23 on Epi 4, NE 15, vaso 0.04  Co-ox 70%. ? ?CRRT -150/hr. ? ? ?Objective: ?Vital signs in last 24 hours: ?Temp:  [97.5 ?F (36.4 ?C)-100.8 ?F (38.2 ?C)] 99.1 ?F (37.3 ?C) (03/10 1707) ?Pulse Rate:  [80-84] 80 (03/10 1707) ?Cardiac Rhythm: Ventricular paced (03/10 0800) ?Resp:  [20-32] 32 (03/10 1707) ?BP: (100-108)/(30-50) 100/34 (03/10 1600) ?SpO2:  [90 %-99 %] 96 % (03/10 1707) ?Arterial Line BP: (100-119)/(44-58) 108/47 (03/10 1707) ?FiO2 (%):  [70 %-90 %] 70 % (03/10 1601) ?Weight:  [95.6 kg] 95.6 kg (03/10 0232) ? ?Hemodynamic parameters for last 24 hours: ?PAP: (45-57)/(20-31) 51/28 ?CVP:  [10 mmHg-24 mmHg] 14 mmHg ?PCWP:  [22 mmHg] 22 mmHg ?CO:  [4.1 L/min-4.7 L/min] 4.3 L/min ?CI:  [2.2 L/min/m2-2.3 L/min/m2] 2.2 L/min/m2 ? ?Intake/Output from previous day: ?03/09 0701 - 03/10 0700 ?In: 3928.2 [P.O.:200; I.V.:1929.5; Blood:315; NG/GT:1210; IV Piggyback:273.7] ?Out: 1950 [Chest Tube:30] ?Intake/Output this shift: ?Total I/O ?In: 1532.9 [I.V.:771.8; NG/GT:500; IV Piggyback:261.1] ?Out: 2710 [Other:2710] ? ?General appearance: intubated and sedated ?Heart: regular rate and rhythm ?Lungs: coarse bilat ?Abdomen: soft, bowel sounds normal ?Wound: incision ok ? ?Lab Results: ?Recent Labs  ?  11/13/21 ?9326 11/13/21 ?0706 11/13/21 ?1019 11/14/21 ?0231  ?WBC 14.5*  --   --  16.8*  ?HGB 7.2*   < > 9.2* 8.4*  ?HCT 25.9*   < > 27.0* 28.9*  ?PLT 161  --   --  169  ? < > = values in this interval not displayed.  ? ?BMET:  ?Recent Labs  ?  11/14/21 ?0231 11/14/21 ?1634  ?NA 133* 134*  ?K 4.4 4.5  ?CL 100 99  ?CO2 22 23  ?GLUCOSE 173* 180*  ?BUN 20 17  ?CREATININE 0.82 0.79  ?CALCIUM 8.4* 8.7*  ?  ?PT/INR: No results for input(s): LABPROT, INR in the last 72  hours. ?ABG ?   ?Component Value Date/Time  ? PHART 7.261 (L) 11/13/2021 1019  ? HCO3 23.7 11/13/2021 1019  ? TCO2 25 11/13/2021 1019  ? ACIDBASEDEF 4.0 (H) 11/13/2021 1019  ? O2SAT 70.2 11/14/2021 1700  ? ?CBG (last 3)  ?Recent Labs  ?  11/14/21 ?0738 11/14/21 ?1107 11/14/21 ?1615  ?GLUCAP 134* 127* 176*  ? ? ?Assessment/Plan: ?S/P Procedure(s) (LRB): ?TRICUSPID VALVE REPAIR USING EDWARDS MC3 TRICUSPID ANNULOPLASTY RING SIZE 28MM (N/A) ?TRANSESOPHAGEAL ECHOCARDIOGRAM (TEE) (N/A) ? ?Hemodynamically stable on current vasopressors with good CO and Co-ox. Keep MAP>65. ? ?Continue CRRT for volume removal.  ? ?Vent management per CCM.  ? ?On Vanc and Zosyn for 7 day course for possible pneumonia. ? ?I wonder if amio is having some negative effect on lungs. ? ? LOS: 18 days  ? ? ?[name] ?11/14/2021 ? ? ?

## 2021-11-14 NOTE — Progress Notes (Signed)
? ?NAME:  [name], MRN:  [id number], DOB:  10/21/1955, LOS: 77 ?ADMISSION DATE:  10/20/2021, CONSULTATION DATE:  11/06/21 ?REFERRING MD:  Nils Pyle, CHIEF COMPLAINT:  hypoxia  ? ?History of Present Illness:  ?[name] is a 66 year old woman with a history of HFrEF and paroxysmal atrial fibrillation who was transferred to [facility] with GI bleed and pericardial effusion.  She had recently undergone watchman insertion in January 2023.  She underwent EGD which did not explain bleeding.  Colonoscopy demonstrated sessile polyps, which were removed in the ascending colon and areas of ischemic colonic ulcers.  There was moderate sigmoid diverticulosis.  Heart catheterization demonstrated normal coronary arteries with equalization of left and right-sided heart pressures, severely reduced PAPi due to severe tricuspid regurgitation.  Swan-Ganz catheter was left in place to assist with weaning inotropic support.  Echocardiograms have previously demonstrated reduced RV function and dilation. On 2/28 she underwent Tricuspid repair and ring annuplasty. Post-op day 1 she was extubated but developed rapidly worsening respiratory failure overnight on 3/2 requiring re-intubation by anesthesia. CXR post-intubation had bilateral infiltrates. ? 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 ?Significant Hospital Events: ?Including procedures, antibiotic start and stop dates in addition to other pertinent events   ? ?2/20 admitted for pericardial effusion without tamponade ?2/22 L&R heart cath ?2/24 EGD & colonoscopy ?2/28 tricuspid valve repair; Vt arrest coming off bypass, back on bypass to stabilize ?3/1 extubated post op, reintubated overnight for hypoxia ?3/2 PCCM consulted for vent management.  Started on vancomycin and Zosyn for presumed aspiration pneumonia ?3/3 remains in a mixed picture of cardiogenic  and septic shock.  ?3/4 started on CRRT ?3/5>> improving pressor needs, stubborn O2 needs ? ?Interim History / Subjective:  ? ?Remains critically ill intubated on mechanical life support ? ?Objective   ?Blood pressure (!) 101/47, pulse 80, temperature 98.4 ?F (36.9 ?C), temperature source Core, resp. rate (!) 32, height '5\' 2"'$  (1.575 m), weight 95.6 kg, SpO2 96 %. ?PAP: (46-62)/(20-36) 48/24 ?CVP:  [12 mmHg-23 mmHg] 14 mmHg ?PCWP:  [24 mmHg] 24 mmHg ?CO:  [4.1 L/min-4.7 L/min] 4.1 L/min ?CI:  [2.2 L/min/m2-2.4 L/min/m2] 2.2 L/min/m2  ?Vent Mode: PRVC ?FiO2 (%):  [70 %-100 %] 70 % ?Set Rate:  [32 bmp] 32 bmp ?Vt Set:  [400 mL] 400 mL ?PEEP:  [16 cmH20] 16 cmH20 ?Plateau Pressure:  [31 cmH20-39 cmH20] 37 cmH20  ? ?Intake/Output Summary (Last 24 hours) at 11/14/2021 0807 ?Last data filed at 11/14/2021 0700 ?Gross per 24 hour  ?Intake 3832.47 ml  ?Output 5935 ml  ?Net -2102.53 ml  ? ?Filed Weights  ? 11/12/21 0400 11/13/21 0400 11/14/21 0232  ?Weight: 99.8 kg 98 kg 95.6 kg  ? ? ?Examination: ?Female, intubated mechanical list for critically ill ?Endotracheal tube in place, NCAT ?Anasarca in the extremities, dependent upper and lower ?Bilateral mechanically ventilated breath sounds ?Regular rhythm S1-S2 ? ?Remains on Veletri through vent circuit at 50 ng/kg/min ?Reviewed labs ? ? ?Resolved Hospital Problem list   ? ? 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  TVR complicated by VT arrest coming off pump, brief. ?Fluid overloaded state of heart. ?Renal failure with oliguria- septic ATN and cardiorenal now on  CRRT ?Postoperative mixed cardiogenic and septic shock- improved but still severe ?Pericardial effusion moderate without tamponade.  Seems better  on more recent echo. ?Chronic AF, hx NSVT, AICD in place ?Ischemic colitis- with ABLA, was resolved,.  Had nonbloody BM 3/4. ?HLD, GERD, OSA ontolerant to BIPAP ?Hx breast ca 1998 with exposure to adriamycin ?Postop ileus- again an issue, resuming neostigmine ?DM - look okay with current regimen ?ETT balloon leak- too dangerous to change out at present, only 20cc's leak ? ?Plan: ?Continue CVVHD with volume management ?Continue vancomycin, Zosyn for 7-day course ?Continue to titrate pressors to maintain mean arterial pressure greater than 65 ?Hold back on bowel regimen as she has had multiple bowel movements last night. ?Continue lung protective mechanical vent support, permissive hypercapnia, limiting driving pressures, ensure vent synchrony with sedation and paralysis as needed ?Continue to wean FiO2 and PEEP to maintain PaO2 ?Continue to pull fluid with CVVHD as tolerated ?CCM appreciate the opportunity to help manage this patient. ? ? ?Best Practice (right click and "Reselect all SmartList Selections" daily)  ? ?Diet/type: tubefeeds ?DVT prophylaxis: SCD ?GI prophylaxis: PPI ?Lines: Central line and Arterial Line ?Foley:  Yes, and it is still needed ?Code Status:  full code ?Last date of multidisciplinary goals of care discussion [3/6] ? ?This patient is critically ill with multiple organ system failure; which, requires frequent high complexity decision making, assessment, support, evaluation, and titration of therapies. This was completed through the application of advanced monitoring technologies and extensive interpretation of multiple databases. During this encounter critical care time was devoted to patient care services described in this note for 35 minutes.  ? ?Natasha Nash, DO ? Pulmonary Critical Care ?11/14/2021 8:08 AM   ? ? ? ?

## 2021-11-14 NOTE — Progress Notes (Addendum)
Advanced Heart Failure Rounding Note  PCP-Cardiologist: None   Subjective:   02/20: Transferred to [facility] for GI bleed + pericardial effusion 02/22: R/LHC with normal cors, equalized R and L sided pressures with no evidence of LV-RV interaction, RA 17, PA 33/21 (25), Fick CO/CI 4.2/2.2, severely reduced PAPi (in setting of severe TR), PA sat 47%, 48% 2/24: S/P Two polyps in the distal ascending colon, removed with a cold snare. Resected and retrieved. Right colonic ischemic ulcers (healing, no active bleeding, biopsied)-likely etiology of LGI. Moderate sigmoid diverticulosis. 2/25: Diuresed with IV lasix.  2/26: Switched to torsemide 60 /60 .  2/28: S/P TVR . Off pump had VT/VT when PA catheter placed. Had cardiac massage. Cardioverted x2. Unsuccessful. Placed back on pump with restoration of SR. Started on IV amiodarone.  3/1: Extubated 3/2: Reintubated for acute hypoxic respiratory failure. Stat CXR ? Possible b/l PNA. Concern for developing septic shock, WBC 39K. PCT 2.49. Increased pressor requirements w/ NE and VP. Milrinone discontinued w/ low SVR. Several rounds of bicarb for persistent metabolic acidosis. Started on vanc + zosyn. 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 LVEF 45-50%, D-shaped interventricular septum, RV moderately reduced, trivial TR   03/09: s/p 1 u RBCs for hgb 7.1  Remains intubated and sedated. FiO2 turned down to 70%, back to up to 80% d/t desaturations  Pressor requirements the same, NE 15, Epi 4, Vaso 0.04  Coox 55%>50%. Hgb 8.4.  MAPs 60s-70s  SWAN #s CVP 12-13 CO 4.48 CI 2.3 PA 43/18 (29) PAPi 1.92 SVR 929  On CVVH, pulling for -150/hr. 6L volume removed yesterday, -2L. Wt down 6 lb overnight.   Multiple bowel movements yesterday   Objective:   Weight Range: 95.6 kg Body mass index is 38.55 kg/m.   Vital  Signs:   Temp:  [97.5 F (36.4 C)-100.8 F (38.2 C)] 98.8 F (37.1 C) (03/10 0814) Pulse Rate:  [80-84] 80 (03/10 0814) Resp:  [20-32] 23 (03/10 0814) BP: (92-112)/(30-54) 101/47 (03/10 0802) SpO2:  [92 %-99 %] 92 % (03/10 0814) Arterial Line BP: (98-119)/(45-58) 100/45 (03/10 0814) FiO2 (%):  [70 %-100 %] 70 % (03/10 0803) Weight:  [95.6 kg] 95.6 kg (03/10 0232) Last BM Date : 11/13/21  Weight change: Filed Weights   11/12/21 0400 11/13/21 0400 11/14/21 0232  Weight: 99.8 kg 98 kg 95.6 kg    Intake/Output:   Intake/Output Summary (Last 24 hours) at 11/14/2021 0830 Last data filed at 11/14/2021 0700 Gross per 24 hour  Intake 3832.47 ml  Output 5935 ml  Net -2102.53 ml      Physical Exam   General:  Sedated on vent, critically ill appearing HEENT: + ETT, + CorTrak Neck: supple. JVP 12-14. + RIJ. Carotids 2+ bilat; no bruits.  Cor: Sternum stable. PMI nondisplaced. Regular rate & rhythm. No rubs, gallops or murmurs.  Lungs: coarse Abdomen: soft, nontender, + distended.  Extremities: no cyanosis, clubbing, rash, 2-3+ edema Neuro: sedated on vent     Telemetry   AV paced  Labs    CBC Recent Labs    11/13/21 0343 11/13/21 0706 11/13/21 1019 11/14/21 0231  WBC 14.5*  --   --  16.8*  NEUTROABS  --   --   --  14.8*  HGB 7.2*   < > 9.2* 8.4*  HCT 25.9*   < > 27.0* 28.9*  MCV 100.0  --   --  96.7  PLT 161  --   --  169   < > = values in this interval not displayed.   Basic Metabolic Panel Recent Labs    11/13/21 0330 11/13/21 0706 11/13/21 1600 11/14/21 0231  NA 136   < > 136 133*  K 4.6   < > 4.2 4.4  CL 102  --  101 100  CO2 23  --  23 22  GLUCOSE 167*  --  157* 173*  BUN 15  --  17 20  CREATININE 0.81  --  0.86 0.82  CALCIUM 8.3*  --  8.4* 8.4*  MG 2.4  --   --  2.4  PHOS 2.1*  --  2.8 2.2*   < > = values in this interval not displayed.   Liver Function Tests Recent Labs    11/13/21 1600 11/14/21 0231  ALBUMIN 2.6* 2.4*   No results  for input(s): LIPASE, AMYLASE in the last 72 hours. Cardiac Enzymes No results for input(s): CKTOTAL, CKMB, CKMBINDEX, TROPONINI in the last 72 hours.  BNP: BNP (last 3 results) Recent Labs    02/24/21 1357 07/16/21 1145 11/01/2021 2138  BNP 326.4* 523.7* 477.9*    ProBNP (last 3 results) No results for input(s): PROBNP in the last 8760 hours.   D-Dimer No results for input(s): DDIMER in the last 72 hours. Hemoglobin A1C No results for input(s): HGBA1C in the last 72 hours.  Fasting Lipid Panel No results for input(s): CHOL, HDL, LDLCALC, TRIG, CHOLHDL, LDLDIRECT in the last 72 hours.   Thyroid Function Tests No results for input(s): TSH, T4TOTAL, T3FREE, THYROIDAB in the last 72 hours.  Invalid input(s): FREET3   Other results:   Imaging    No results found.   Medications:     Scheduled Medications:  acetaminophen (TYLENOL) oral liquid 160 mg/5 mL  650 mg Per Tube Q6H   artificial tears  1 application. Both Eyes Q8H   aspirin EC  325 mg Oral Daily   Or   aspirin  324 mg Per Tube Daily   atorvastatin  10 mg Per Tube q AM   B-complex with vitamin C  1 tablet Per Tube Daily   bisacodyl  10 mg Oral Daily   Or   bisacodyl  10 mg Rectal Daily   buPROPion  75 mg Per Tube BID   chlorhexidine gluconate (MEDLINE KIT)  15 mL Mouth Rinse BID   Chlorhexidine Gluconate Cloth  6 each Topical Daily   docusate  100 mg Per Tube BID   feeding supplement (PROSource TF)  45 mL Per Tube TID   folic acid  1 mg Per Tube Daily   insulin aspart  0-24 Units Subcutaneous Q4H   insulin detemir  8 Units Subcutaneous BID   mouth rinse  15 mL Mouth Rinse 10 times per day   metoCLOPramide (REGLAN) injection  5 mg Intravenous Q6H   neostigmine  0.25 mg Subcutaneous Q6H   pantoprazole sodium  40 mg Per Tube Daily   polyethylene glycol  17 g Per Tube Daily   sennosides  10 mL Per Tube BID   sodium chloride flush  10-40 mL Intracatheter Q12H   sodium chloride flush  3 mL Intravenous  Q12H   venlafaxine  75 mg Per Tube BID WC    Infusions:   prismasol BGK 4/2.5 800 mL/hr at 11/14/21 0333    prismasol BGK 4/2.5 400 mL/hr at 11/13/21 2130   sodium chloride 20 mL/hr at 11/07/21 1800  sodium chloride     sodium chloride 5 mL/hr at 11/10/21 0800   sodium chloride     sodium chloride Stopped (11/12/21 1932)   amiodarone 30 mg/hr (11/14/21 0805)   epinephrine 4 mcg/min (11/14/21 0700)   epoprostenol (VELETRI) for inhalation 1.74m/50mL (30,000 ng/mL) 50 ng/kg/min (11/14/21 0602)   feeding supplement (VITAL 1.5 CAL) 50 mL/hr at 11/14/21 0700   fentaNYL infusion INTRAVENOUS 175 mcg/hr (11/14/21 0700)   heparin 10,000 units/ 20 mL infusion syringe 500 Units/hr (11/14/21 0700)   lactated ringers 5 mL/hr at 11/14/21 0700   midazolam 6 mg/hr (11/14/21 0700)   norepinephrine (LEVOPHED) Adult infusion 15 mcg/min (11/14/21 0807)   prismasol BGK 4/2.5 1,500 mL/hr at 11/14/21 0517   vasopressin 0.04 Units/min (11/14/21 0804)     PRN Medications: sodium chloride, Place/Maintain arterial line **AND** sodium chloride, sodium chloride, fentaNYL, heparin, lip balm, midazolam, ondansetron (ZOFRAN) IV, rocuronium bromide, sodium chloride, sodium chloride flush, sodium chloride flush     Patient Profile    66y.o. female with h/o PAF with recent failed ablation S/p Watchman implant 08/21/21,  breast CA in 1998 s/p left breast surgery and s/p Adriamycin x 4 cycles, non-ischemic cardiomyopathy/chronic systolic CHF, LBBB, s/p BiV Medtronic ICD, HTN, OSA, and CKD Stage IIIb. Transferred from OSH for acute GIB and pericardial effusion. Initial Echo here showed EF 35-40% RV severely enlarged w/ low normal systolic fx. Moderate posterior effusion, no tamponade. Severe TR noted. R/LCH showed normal cors, LVEF 40%, Equalized right and left-sided pressure with no evidence of LV-RV interaction and evidence of severe RV failure w/ severely reduced PAPi, 0.6, in setting of severe TR. Went to OR 2/28  for TVR. Off pump had VT/VF when PA catheter placed. Had cardiac massage. Cardioverted x2. Unsuccessful. Placed back on pump with restoration of SR. Started on IV amiodarone.   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 Acute on Chronic Systolic CHF w/ Post Cardiotomy Shock  - felt to have LBBB CM vs chemo induced (got adriamycin). 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 No effusion -Echo 02/21: EF 35-40%, RV severely enlarged with okay function, moderate MR, severe TR with flail posterior leaflet -RHC 02/22: RA 17, PA 33/21 (25), PCWP 18, PAPi 0.6 (in setting of severe TR), PA sat 47%, 48% -S/p TVR 2/28. Intraop TEE EF 40-45%, RV mildly reduced.  VT arrest when coming off pump  - Mixed septic/cardiogenic shock post-operatively - Milrinone discontinued 3/2 w/ low SVR - Repeat limited echo 3/6 EF 45-50%, RV mod reduced, R>>L atrial level shunt  - Pressors decreasing slowly. Epi 4, NE 15, VP 0.04. No change overnight. Co-ox remains low, 55>50% this am. SVR 929. - Completed Broad spectrum vanc/Zosyn. Pan-cultures NGTD.  - Still volume overloaded. Tolerating CVVHD, pulling -150. - Continue to follow hemodynamics off swan  3. Acute Hypoxic Respiratory Failure - Extubated 3/1 - Reintubated 3/2. CXR ? B/l PNA  - PCT 2.49>>3.66>>4.15 - WBC 24>>36>>34>39>31>>18.5>>20>>17>>20>>17K >> 14.5>>16.8. Covered w/ empiric abx, Vanc + zosyn  - Cultures NGTD - CXR with bilateral diffuse infiltrates, suspect PNA + volume overload/pulmonary edema. FiO2 70%>>back up to 80%, needs fluid off.  - Fluid removal via CVVH.  -  Appreciate CCM's management   4. PAF/tachycardia - Unable to complete ablation 10/22  d/t pericardial effusion - S/p Watchman implant 08/21/21 - continue amio gtt 30 mg/hr while on inotropes/ pressors  - heparin through CVVHD circuit   5. AKI on CKD IIIb/IV/ ATN  - Suspect ATN.  - Marked volume overload with no response to IV diuretics => now on CVVH. Nephrology following    6. GI bleeding - BRBPR + melena 4-5 days prior to admission - Per her report CT at OSH with diverticulosis but no diverticulitis - 1 u PRBCs this admit -EGD/Colonoscopy- R colonic ischemic ulcers - suspect in the setting of low output hf., 2 polyps removed, diverticulosis. Polyps no malignancy   - On PPI  -Transfuse < 8.  - 1 u RBCs 03/09 - Hgb 8.4 today  7. Hypokalemia/ hypomagnesia - K /Mag stable.   8. VT/VF -Intraoperative. Placed on amio drip.  -stable, no recurrence  -continue amio gtt -keep K >4.0 and Mg >2.0  9. 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  Remains intubated/sedated. On multiple pressors and CVVHD. Hemodynamics still marginal. Vent down 100%-> 80%  Rhythm stable  General:  Critically-ill appearing. No resp difficulty HEENT: normal + ETT and coretrak Neck: supple. RIJ swan  Carotids 2+ bilat; no bruits. No lymphadenopathy or thryomegaly appreciated. Cor: PMI nondisplaced. Regular rate & rhythm. No rubs, gallops or murmurs. Lungs: coarse Abdomen: soft, nontender, nondistended. No hepatosplenomegaly. No bruits or masses. Good bowel sounds. Extremities: no cyanosis, clubbing, rash, 2-3+ edema  + multiple ecchymoses Neuro: intubated/sedated  Remains critically ill. Making some progress with volume removal but still a  way to go. Will wean pressors as tolerated but keep pulling 150cc hr. Respiratory status still very concerning. Will likely need trach next week.   Prognosis still guarded.  CRITICAL CARE Performed by: Glori Bickers  Total critical care time: 35 minutes  Critical care time was exclusive of separately billable procedures and treating other patients.  Critical care was necessary to treat or prevent imminent or life-threatening deterioration.  Critical care was time spent personally by me (independent of midlevel providers or residents) on the following activities: development of treatment plan with patient and/or surrogate as well as nursing, discussions with consultants, evaluation of patient's response to treatment, examination of patient, obtaining history from patient or surrogate, ordering and performing treatments and interventions, ordering and review of laboratory studies, ordering and review of radiographic studies, pulse oximetry and re-evaluation of patient's condition.  Glori Bickers, MD  6:45 PM

## 2021-11-14 NOTE — Progress Notes (Signed)
Gave handoff report to Caryl Pina, Therapist, sports. All questions answered. Patient assessed at bedside with on coming nurse.  ?

## 2021-11-15 ENCOUNTER — Inpatient Hospital Stay (HOSPITAL_COMMUNITY): Payer: Medicare PPO

## 2021-11-15 DIAGNOSIS — I3139 Other pericardial effusion (noninflammatory): Secondary | ICD-10-CM | POA: Diagnosis not present

## 2021-11-15 LAB — COOXEMETRY PANEL
Carboxyhemoglobin: 2.4 % — ABNORMAL HIGH (ref 0.5–1.5)
Carboxyhemoglobin: 2.5 % — ABNORMAL HIGH (ref 0.5–1.5)
Carboxyhemoglobin: 2.6 % — ABNORMAL HIGH (ref 0.5–1.5)
Methemoglobin: 3.4 % — ABNORMAL HIGH (ref 0.0–1.5)
Methemoglobin: 0.9 % (ref 0.0–1.5)
Methemoglobin: <0.7 % (ref 0.0–1.5)
O2 Saturation: 41 %
O2 Saturation: 43.7 %
O2 Saturation: 46 %
Total hemoglobin: 9 g/dL — ABNORMAL LOW (ref 12.0–16.0)
Total hemoglobin: 8.1 g/dL — ABNORMAL LOW (ref 12.0–16.0)
Total hemoglobin: 8 g/dL — ABNORMAL LOW (ref 12.0–16.0)

## 2021-11-15 LAB — RENAL FUNCTION PANEL
Albumin: 2.5 g/dL — ABNORMAL LOW (ref 3.5–5.0)
Albumin: 2.3 g/dL — ABNORMAL LOW (ref 3.5–5.0)
Anion gap: 12 (ref 5–15)
Anion gap: 9 (ref 5–15)
BUN: 19 mg/dL (ref 8–23)
BUN: 19 mg/dL (ref 8–23)
CO2: 22 mmol/L (ref 22–32)
CO2: 24 mmol/L (ref 22–32)
Calcium: 9 mg/dL (ref 8.9–10.3)
Calcium: 8.6 mg/dL — ABNORMAL LOW (ref 8.9–10.3)
Chloride: 100 mmol/L (ref 98–111)
Chloride: 101 mmol/L (ref 98–111)
Creatinine, Ser: 0.78 mg/dL (ref 0.44–1.00)
Creatinine, Ser: 0.83 mg/dL (ref 0.44–1.00)
GFR, Estimated: >60 mL/min (ref >60)
GFR, Estimated: >60 mL/min (ref >60)
Glucose, Bld: 150 mg/dL — ABNORMAL HIGH (ref 70–99)
Glucose, Bld: 156 mg/dL — ABNORMAL HIGH (ref 70–99)
Phosphorus: 2.8 mg/dL (ref 2.5–4.6)
Phosphorus: 2.6 mg/dL (ref 2.5–4.6)
Potassium: 4.9 mmol/L (ref 3.5–5.1)
Potassium: 4.9 mmol/L (ref 3.5–5.1)
Sodium: 134 mmol/L — ABNORMAL LOW (ref 135–145)
Sodium: 134 mmol/L — ABNORMAL LOW (ref 135–145)

## 2021-11-15 LAB — EXPECTORATED SPUTUM ASSESSMENT W GRAM STAIN, RFLX TO RESP C

## 2021-11-15 LAB — GLUCOSE, CAPILLARY
Glucose-Capillary: 140 mg/dL — ABNORMAL HIGH (ref 70–99)
Glucose-Capillary: 184 mg/dL — ABNORMAL HIGH (ref 70–99)
Glucose-Capillary: 209 mg/dL — ABNORMAL HIGH (ref 70–99)
Glucose-Capillary: 146 mg/dL — ABNORMAL HIGH (ref 70–99)
Glucose-Capillary: 232 mg/dL — ABNORMAL HIGH (ref 70–99)
Glucose-Capillary: 235 mg/dL — ABNORMAL HIGH (ref 70–99)

## 2021-11-15 LAB — CBC
HCT: 31 % — ABNORMAL LOW (ref 36.0–46.0)
Hemoglobin: 9.1 g/dL — ABNORMAL LOW (ref 12.0–15.0)
MCH: 28.8 pg (ref 26.0–34.0)
MCHC: 29.4 g/dL — ABNORMAL LOW (ref 30.0–36.0)
MCV: 98.1 fL (ref 80.0–100.0)
Platelets: 202 10*3/uL (ref 150–400)
RBC: 3.16 MIL/uL — ABNORMAL LOW (ref 3.87–5.11)
RDW: 22.8 % — ABNORMAL HIGH (ref 11.5–15.5)
WBC: 22.5 10*3/uL — ABNORMAL HIGH (ref 4.0–10.5)
nRBC: 16.5 % — ABNORMAL HIGH (ref 0.0–0.2)

## 2021-11-15 LAB — HEPARIN LEVEL (UNFRACTIONATED): Heparin Unfractionated: <0.1 IU/mL — ABNORMAL LOW (ref 0.30–0.70)

## 2021-11-15 LAB — MAGNESIUM: Magnesium: 2.6 mg/dL — ABNORMAL HIGH (ref 1.7–2.4)

## 2021-11-15 LAB — APTT: aPTT: 49 seconds — ABNORMAL HIGH (ref 24–36)

## 2021-11-15 LAB — MRSA NEXT GEN BY PCR, NASAL: MRSA by PCR Next Gen: NOT DETECTED

## 2021-11-15 MED ORDER — SODIUM CHLORIDE 0.9 % IV SOLN
1.0000 g | Freq: Three times a day (TID) | INTRAVENOUS | Status: DC
  Administered 2021-11-15 – 2021-11-17 (×7): 1 g via INTRAVENOUS
  Filled 2021-11-15 (×9): qty 20

## 2021-11-15 MED ORDER — VANCOMYCIN HCL IN DEXTROSE 1-5 GM/200ML-% IV SOLN
1000.0000 mg | INTRAVENOUS | Status: DC
  Administered 2021-11-16 – 2021-11-17 (×2): 1000 mg via INTRAVENOUS
  Filled 2021-11-15 (×2): qty 200

## 2021-11-15 MED ORDER — VANCOMYCIN HCL 1750 MG/350ML IV SOLN
1750.0000 mg | Freq: Once | INTRAVENOUS | Status: AC
  Administered 2021-11-15: 1750 mg via INTRAVENOUS
  Filled 2021-11-15: qty 350

## 2021-11-15 NOTE — Progress Notes (Signed)
Pharmacy Antibiotic Note ? ?[name] is a 66 y.o. female admitted on 10/28/2021 with pericardial effusion who underwent TVR with annuloplasty, leaflet plasty, chord reconstruction on 2/28. Patient was extubated and developed worsening respiratory status requiring re-intubation concern for ARDS or aspiration PNA.  Patient s/p 7 days vancomycin/zosyn ending on 3/8 for suspected aspiration PNA.  .   ? ?Now with increasing WBCs from 16>22.5 and spiking fevers to 101 on CRRT.  ? ?Pharmacy has been consulted for vancomycin/merropem dosing. Patient remains on CRRT for fluid overload and is intubated and sedated.  ? ?Plan: ?Start merropenem 1g q8h  (CRRT dosing) ?Vancomycin 1750 mg x1 then 1g q24h (CRRT dosing) ?Collect Bcx, Rcx, MRSA PCR ?Monitor s/x of infection, cultures, and determine LOT ? ?Height: '5\' 2"'$  (157.5 cm) ?Weight: 92 kg (202 lb 13.2 oz) ?IBW/kg (Calculated) : 50.1 ? ?Temp (24hrs), Avg:98.7 ?F (37.1 ?C), Min:93.2 ?F (34 ?C), Max:102.2 ?F (39 ?C) ? ?Recent Labs  ?Lab 11/11/21 ?0312 11/11/21 ?1554 11/12/21 ?0358 11/12/21 ?1615 11/13/21 ?0330 11/13/21 ?0343 11/13/21 ?1600 11/14/21 ?0231 11/14/21 ?1634 11/15/21 ?6160  ?WBC 20.7*  --  16.1*  --   --  14.5*  --  16.8*  --  22.5*  ?CREATININE 0.82   < > 0.75   < > 0.81  --  0.86 0.82 0.79 0.78  ? < > = values in this interval not displayed.  ?  ?Estimated Creatinine Clearance: 72.1 mL/min (by C-G formula based on SCr of 0.78 mg/dL).   ? ?Allergies  ?Allergen Reactions  ? Buprenorphine Hcl Shortness Of Breath and Other (See Comments)  ?  Patient required emergent use of naloxone and naloxone drip in ICU for only taking Percocet 5-325 over the course of one day. She appears to have impaired metabolism of opioid medications and providers should use these medications with extreme caution.  ? Morphine And Related Shortness Of Breath, Rash and Other (See Comments)  ?  Patient required emergent use of naloxone and naloxone drip in ICU for only taking Percocet  5-325 over the course of one day. She appears to have impaired metabolism of opioid medications and providers should use these medications with extreme caution.  ?Mental status changes, also ?  ? Oxycodone Shortness Of Breath and Other (See Comments)  ?  Mental status changes and ?Patient required emergent use of naloxone and naloxone drip in ICU for only taking Percocet 5-325 over the course of one day. She appears to have impaired metabolism of opioid medications and providers should use these medications with extreme caution.   ? Percocet [Oxycodone-Acetaminophen] Shortness Of Breath and Other (See Comments)  ?  Hospitalized-  ?Patient required emergent use of naloxone and naloxone drip in ICU for only taking Percocet 5-325 over the course of one day. She appears to have impaired metabolism of opioid medications and providers should use these medications with extreme caution.   ? ? ?Antimicrobials this admission: ?3/2 vanc >>3/8; 3/11>> ?3/2 zosyn>>3/8 ?3/11 merropenem >>  ? ?Dose adjustments this admission: ?none ? ?Microbiology results: ?3/11 Bcx ?3/11 Rcx ?3/11 MRSA PCR ? ?3/2 Bcx NGF ?3/2 Ucx NGF ?3/2 Rcx abundent WBCs, no orgs, final ?3/2 MRSA neg ? ?Thank you for allowing pharmacy to be a part of this patient?s care. ? ?Eduard Clos Cephus Tupy ?11/15/2021 4:17 PM ? ?

## 2021-11-15 NOTE — Progress Notes (Signed)
? ?NAME:  [name], MRN:  [id number], DOB:  1956-09-07, LOS: 77 ?ADMISSION DATE:  10/26/2021, CONSULTATION DATE:  11/06/21 ?REFERRING MD:  Nils Pyle, CHIEF COMPLAINT:  hypoxia  ? ?History of Present Illness:  ?[name] is a 66 year old woman with a history of HFrEF and paroxysmal atrial fibrillation who was transferred to [facility] with GI bleed and pericardial effusion.  She had recently undergone watchman insertion in January 2023.  She underwent EGD which did not explain bleeding.  Colonoscopy demonstrated sessile polyps, which were removed in the ascending colon and areas of ischemic colonic ulcers.  There was moderate sigmoid diverticulosis.  Heart catheterization demonstrated normal coronary arteries with equalization of left and right-sided heart pressures, severely reduced PAPi due to severe tricuspid regurgitation.  Swan-Ganz catheter was left in place to assist with weaning inotropic support.  Echocardiograms have previously demonstrated reduced RV function and dilation. On 2/28 she underwent Tricuspid repair and ring annuplasty. Post-op day 1 she was extubated but developed rapidly worsening respiratory failure overnight on 3/2 requiring re-intubation by anesthesia. CXR post-intubation had bilateral infiltrates. ? 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 ?Significant Hospital Events: ?Including procedures, antibiotic start and stop dates in addition to other pertinent events   ? ?2/20 admitted for pericardial effusion without tamponade ?2/22 L&R heart cath ?2/24 EGD & colonoscopy ?2/28 tricuspid valve repair; Vt arrest coming off bypass, back on bypass to stabilize ?3/1 extubated post op, reintubated overnight for hypoxia ?3/2 PCCM consulted for vent management.  Started on vancomycin and Zosyn for presumed aspiration pneumonia ?3/3 remains in a mixed picture of cardiogenic  and septic shock.  ?3/4 started on CRRT ?3/5>> improving pressor needs, stubborn O2 needs ? ?Interim History / Subjective:  ? ?Critically ill. Intubated on mechanical life support.  ? ?Objective   ?Blood pressure (!) 102/47, pulse 80, temperature 98.2 ?F (36.8 ?C), resp. rate (!) 32, height '5\' 2"'$  (1.575 m), weight 92 kg, SpO2 91 %. ?PAP: (42-51)/(21-29) 47/22 ?CVP:  [9 mmHg-24 mmHg] 12 mmHg ?PCWP:  [22 mmHg] 22 mmHg ?CO:  [4 L/min-4.8 L/min] 4.8 L/min ?CI:  [2 L/min/m2-2.5 L/min/m2] 2.5 L/min/m2  ?Vent Mode: PRVC ?FiO2 (%):  [60 %-80 %] 60 % ?Set Rate:  [32 bmp] 32 bmp ?Vt Set:  [400 mL] 400 mL ?PEEP:  [16 cmH20] 16 cmH20 ?Plateau Pressure:  [37 cmH20-38 cmH20] 37 cmH20  ? ?Intake/Output Summary (Last 24 hours) at 11/15/2021 0725 ?Last data filed at 11/15/2021 0700 ?Gross per 24 hour  ?Intake 3490.92 ml  ?Output 6589 ml  ?Net -3098.08 ml  ? ?Filed Weights  ? 11/13/21 0400 11/14/21 0232 11/15/21 0450  ?Weight: 98 kg 95.6 kg 92 kg  ? ? ?Examination: ?General: Elderly female intubated on mechanical life support ?HEENT: Endotracheal tube in place, NCAT ?Extremities: Bilateral anasarca upper and lower extremity dependent edema ?Heart: Regular rate rhythm S1-S2 ?Lungs: Bilateral mechanically ventilated breath sounds ? ? ?Resolved Hospital Problem list   ? ? ?Assessment & Plan:  ? 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  TVR complicated by VT arrest coming off pump, brief. ?Fluid overloaded state of heart. ?Renal failure with oliguria- septic ATN and cardiorenal now on CRRT ?Postoperative mixed cardiogenic and septic shock- improved  but still severe ?Pericardial effusion moderate without tamponade.  Seems better on more recent echo. ?Chronic AF, hx NSVT, AICD in  place ?Ischemic colitis- with ABLA, was resolved,.  Had nonbloody BM 3/4. ?HLD, GERD, OSA ontolerant to BIPAP ?Hx breast ca 1998 with exposure to adriamycin ?Postop ileus- again an issue, resuming neostigmine ?DM - look okay with current regimen ?ETT balloon leak- too dangerous to change out at present, only 20cc's leak ? ?Plan: ?Continue CVVHD for volume management ?Continue vancomycin plus Zosyn for 7-day course ?Continue to titrate vasopressors to maintain mean artery pressure greater than 65 mmHg. ?Tight back on patient's bowel regimen as she has had bowel movement. ?Continue lung protective mechanical vent support with permissive hypercapnia limiting driving pressures and ensuring ventilator synchrony whilst being sedated and paralyzed on mechanical life support. ?We will continue to wean FiO2 and PEEP to maintain SPO2 greater than 90%. ?Volume management predominantly with CVVHD. ?We appreciate the opportunity to help care for this patient.. ? ?I updated the patient's husband at bedside. ? ?Best Practice (right click and "Reselect all SmartList Selections" daily)  ? ?Diet/type: tubefeeds ?DVT prophylaxis: SCD ?GI prophylaxis: PPI ?Lines: Central line and Arterial Line ?Foley:  Yes, and it is still needed ?Code Status:  full code ?Last date of multidisciplinary goals of care discussion [3/6] ? ?This patient is critically ill with multiple organ system failure; which, requires frequent high complexity decision making, assessment, support, evaluation, and titration of therapies. This was completed through the application of advanced monitoring technologies and extensive interpretation of multiple databases. During this encounter critical care time was devoted to patient care services described in this note for 34 minutes. ? ?Garner Nash, DO ?[facility] ?11/15/2021 7:25 AM   ? ? ? ?

## 2021-11-15 NOTE — Progress Notes (Signed)
RT turned EPO off at 1606. Pts currently 92% and BP is 107/45. She is tolerating being off EPO well at this time. MD made aware. RT will continue to monitor.  ?

## 2021-11-15 NOTE — Progress Notes (Signed)
[facility] ROUNDING NOTE   Subjective:   Interval History: Stage III/IV chronic kidney disease setting of severe ARDS with severe shock septic/cardiogenic status post TVR 11/05/2020.  Placed on CRRT for volume control.  Hemodynamics stable on epinephrine, norepinephrine and vasopressin.  Amiodarone drip for atrial fibrillation  Blood pressure 72/58 pulse 80 temperature 99 O2 sats 93% FiO2 70%  Negative fluid balance as tolerated  Sodium 134 potassium 4.9 chloride 100 CO2 22 BUN 19 creatinine 0.78 glucose 150 calcium 9 phosphorus 2.8 magnesium 2.6 albumin 2.5 hemoglobin 9.1   Objective:  Vital signs in last 24 hours:  Temp:  [93.2 F (34 C)-100.6 F (38.1 C)] 99.1 F (37.3 C) (03/11 0915) Pulse Rate:  [78-81] 80 (03/11 0915) Resp:  [15-33] 28 (03/11 0915) BP: (72-112)/(34-58) 72/58 (03/11 0800) SpO2:  [83 %-99 %] 93 % (03/11 0915) Arterial Line BP: (95-123)/(44-58) 106/46 (03/11 0915) FiO2 (%):  [60 %-80 %] 70 % (03/11 0857) Weight:  [92 kg] 92 kg (03/11 0450)  Weight change: -3.6 kg Filed Weights   11/13/21 0400 11/14/21 0232 11/15/21 0450  Weight: 98 kg 95.6 kg 92 kg    Intake/Output: I/O last 3 completed shifts: In: 5059.8 [I.V.:2798.7; Other:10; NG/GT:1990; IV Piggyback:261.1] Out: 7356 [Other:9465; Stool:300; Chest Tube:30]   Intake/Output this shift:  Total I/O In: 260 [I.V.:160; NG/GT:100] Out: 497 [Other:497]  Critically ill intubated sedated CVS- RRR ET tube and core track RS- CTA JVP elevated intubated ABD- BS present soft non-distended EXT- no edema   Basic Metabolic Panel: Recent Labs  Lab 11/11/21 0312 11/11/21 0403 11/12/21 0358 11/12/21 0859 11/13/21 0330 11/13/21 0706 11/13/21 1019 11/13/21 1600 11/14/21 0231 11/14/21 1634 11/15/21 0427  NA 132*   < > 133*   < > 136   < > 136 136 133* 134* 134*  K 4.5   < > 3.9   < > 4.6   < > 4.5 4.2 4.4 4.5 4.9  CL 101   < > 100   < > 102  --   --  101 100 99 100  CO2 22   < > 23   <  > 23  --   --  _0 GLUCOSE 198*   < > 148*   < > 167*  --   --  157* 173* 180* 150*  BUN 18   < > 18   < > 15  --   --  _1 CREATININE 0.82   < > 0.75   < > 0.81  --   --  0.86 0.82 0.79 0.78  CALCIUM 7.6*   < > 7.9*   < > 8.3*  --   --  8.4* 8.4* 8.7* 9.0  MG 2.4  --  2.4  --  2.4  --   --   --  2.4  --  2.6*  PHOS 1.4*   < > 1.1*   < > 2.1*  --   --  2.8 2.2* 2.9 2.8   < > = values in this interval not displayed.     Liver Function Tests: Recent Labs  Lab 11/09/21 0408 11/09/21 1600 11/10/21 0431 11/10/21 1555 11/11/21 0312 11/11/21 1554 11/13/21 0330 11/13/21 1600 11/14/21 0231 11/14/21 1634 11/15/21 0427  AST 61*  --  53*  --  61*  --   --   --   --   --   --   ALT 8  --  8  --  12  --   --   --   --   --   --   ALKPHOS 236*  --  335*  --  429*  --   --   --   --   --   --   BILITOT 1.0  --  1.2  --  1.2  --   --   --   --   --   --   PROT 5.0*  --  5.2*  --  5.7*  --   --   --   --   --   --   ALBUMIN 1.7*   < > 1.6*   < > 1.7*   < > 2.7* 2.6* 2.4* 2.3* 2.5*   < > = values in this interval not displayed.    No results for input(s): LIPASE, AMYLASE in the last 168 hours. Recent Labs  Lab 11/11/21 0312  AMMONIA 62*     CBC: Recent Labs  Lab 11/11/21 0312 11/11/21 0403 11/12/21 0358 11/12/21 0859 11/13/21 0343 11/13/21 0706 11/13/21 0801 11/13/21 1019 11/14/21 0231 11/15/21 0427  WBC 20.7*  --  16.1*  --  14.5*  --   --   --  16.8* 22.5*  NEUTROABS 17.2*  --   --   --   --   --   --   --  14.8*  --   HGB 9.0*   < > 7.7*   < > 7.2* 9.2* 9.5* 9.2* 8.4* 9.1*  HCT 29.6*   < > 26.5*   < > 25.9* 27.0* 28.0* 27.0* 28.9* 31.0*  MCV 91.6  --  96.0  --  100.0  --   --   --  96.7 98.1  PLT 161  --  152  --  161  --   --   --  169 202   < > = values in this interval not displayed.     Cardiac Enzymes: No results for input(s): CKTOTAL, CKMB, CKMBINDEX, TROPONINI in the last 168 hours.  BNP: Invalid input(s): POCBNP  CBG: Recent Labs  Lab  11/14/21 1615 11/14/21 1925 11/14/21 2305 11/15/21 0440 11/15/21 0805  GLUCAP 176* 214* 233* 140* 184*     Microbiology: Results for orders placed or performed during the hospital encounter of 10/22/2021  MRSA Next Gen by PCR, Nasal     Status: None   Collection Time: 10/24/2021  8:41 PM   Specimen: Nasal Mucosa; Nasal Swab  Result Value Ref Range Status   MRSA by PCR Next Gen NOT DETECTED NOT DETECTED Final    Comment: (NOTE) The GeneXpert MRSA Assay (FDA approved for NASAL specimens only), is one component of a comprehensive MRSA colonization surveillance program. It is not intended to diagnose MRSA infection nor to guide or monitor treatment for MRSA infections. Test performance is not FDA approved in patients less than 13 years old. Performed at [facility], [place] [address] [place], [place] [address]   Resp Panel by RT-PCR (Flu A&B, Covid) Nasopharyngeal Swab     Status: None   Collection Time: 10/09/2021  9:13 PM   Specimen: Nasopharyngeal Swab; Nasopharyngeal(NP) swabs in vial transport medium  Result Value Ref Range Status   SARS Coronavirus 2 by RT PCR NEGATIVE NEGATIVE Final    Comment: (NOTE) SARS-CoV-2 target nucleic acids are NOT DETECTED.  The SARS-CoV-2 RNA is generally detectable in upper respiratory specimens during the acute phase of infection. The lowest concentration of SARS-CoV-2 viral copies  this assay can detect is 138 copies/mL. A negative result does not preclude SARS-Cov-2 infection and should not be used as the sole basis for treatment or other patient management decisions. A negative result may occur with  improper specimen collection/handling, submission of specimen other than nasopharyngeal swab, presence of viral mutation(s) within the areas targeted by this assay, and inadequate number of viral copies(<138 copies/mL). A negative result must be combined with clinical observations, patient history, and epidemiological information. The  expected result is Negative.  Fact Sheet for Patients:  EntrepreneurPulse.com.au  Fact Sheet for Healthcare Providers:  IncredibleEmployment.be  This test is no t yet approved or cleared by the [place] FDA and  has been authorized for detection and/or diagnosis of SARS-CoV-2 by FDA under an Emergency Use Authorization (EUA). This EUA will remain  in effect (meaning this test can be used) for the duration of the COVID-19 declaration under Section 564(b)(1) of the Act, 21 U.S.C.section 360bbb-3(b)(1), unless the authorization is terminated  or revoked sooner.       Influenza A by PCR NEGATIVE NEGATIVE Final   Influenza B by PCR NEGATIVE NEGATIVE Final    Comment: (NOTE) The Xpert Xpress SARS-CoV-2/FLU/RSV plus assay is intended as an aid in the diagnosis of influenza from Nasopharyngeal swab specimens and should not be used as a sole basis for treatment. Nasal washings and aspirates are unacceptable for Xpert Xpress SARS-CoV-2/FLU/RSV testing.  Fact Sheet for Patients: EntrepreneurPulse.com.au  Fact Sheet for Healthcare Providers: IncredibleEmployment.be  This test is not yet approved or cleared by the [place] FDA and has been authorized for detection and/or diagnosis of SARS-CoV-2 by FDA under an Emergency Use Authorization (EUA). This EUA will remain in effect (meaning this test can be used) for the duration of the COVID-19 declaration under Section 564(b)(1) of the Act, 21 U.S.C. section 360bbb-3(b)(1), unless the authorization is terminated or revoked.  Performed at [facility], [place] [address] [place], [place] [address]   Culture, blood (routine x 2)     Status: None   Collection Time: 10/28/21  4:48 PM   Specimen: BLOOD  Result Value Ref Range Status   Specimen Description BLOOD SHOULDER RIGHT  Final   Special Requests   Final    BOTTLES DRAWN AEROBIC AND ANAEROBIC Blood  Culture adequate volume   Culture   Final    NO GROWTH 5 DAYS Performed at [facility], [place] [address], [place], [place] [address]    Report Status 11/02/2021 FINAL  Final  Culture, blood (routine x 2)     Status: None   Collection Time: 10/28/21  5:01 PM   Specimen: BLOOD  Result Value Ref Range Status   Specimen Description BLOOD RIGHT ANTECUBITAL  Final   Special Requests   Final    BOTTLES DRAWN AEROBIC AND ANAEROBIC Blood Culture results may not be optimal due to an inadequate volume of blood received in culture bottles   Culture   Final    NO GROWTH 5 DAYS Performed at [facility], [place] [address] [place], [place] [address]    Report Status 11/02/2021 FINAL  Final  Surgical pcr screen     Status: None   Collection Time: 11/01/21  4:00 PM   Specimen: Nasal Mucosa; Nasal Swab  Result Value Ref Range Status   MRSA, PCR NEGATIVE NEGATIVE Final   Staphylococcus aureus NEGATIVE NEGATIVE Final    Comment: (NOTE) The Xpert SA Assay (FDA approved for NASAL specimens in patients  63 years of age and older), is one component of a comprehensive surveillance program. It is not intended to diagnose infection nor to guide or monitor treatment. Performed at [facility], [place] [address] [place], [place] [address]   MRSA Next Gen by PCR, Nasal     Status: None   Collection Time: 11/06/21  7:10 AM   Specimen: Nasal Mucosa; Nasal Swab  Result Value Ref Range Status   MRSA by PCR Next Gen NOT DETECTED NOT DETECTED Final    Comment: (NOTE) The GeneXpert MRSA Assay (FDA approved for NASAL specimens only), is one component of a comprehensive MRSA colonization surveillance program. It is not intended to diagnose MRSA infection nor to guide or monitor treatment for MRSA infections. Test performance is not FDA approved in patients less than 56 years old. Performed at [facility], [place] [address], [place], [place] [address]   Culture, Respiratory w Gram Stain      Status: None   Collection Time: 11/06/21 12:08 PM   Specimen: Tracheal Aspirate  Result Value Ref Range Status   Specimen Description TRACHEAL ASPIRATE  Final   Special Requests NONE  Final   Gram Stain   Final    ABUNDANT WBC PRESENT, PREDOMINANTLY PMN NO ORGANISMS SEEN    Culture   Final    RARE CANDIDA ALBICANS NO STAPHYLOCOCCUS AUREUS ISOLATED No Pseudomonas species isolated Performed at [facility], [place] [address], [place], [place] [address]    Report Status 11/09/2021 FINAL  Final  Culture, blood (Routine X 2) w Reflex to ID Panel     Status: None   Collection Time: 11/06/21  2:33 PM   Specimen: BLOOD  Result Value Ref Range Status   Specimen Description BLOOD RIGHT ANTECUBITAL  Final   Special Requests   Final    BOTTLES DRAWN AEROBIC AND ANAEROBIC Blood Culture adequate volume   Culture   Final    NO GROWTH 5 DAYS Performed at [facility], [place] [address] [place], [place] [address]    Report Status 11/11/2021 FINAL  Final  Culture, blood (Routine X 2) w Reflex to ID Panel     Status: None   Collection Time: 11/06/21  2:40 PM   Specimen: BLOOD RIGHT WRIST  Result Value Ref Range Status   Specimen Description BLOOD RIGHT WRIST  Final   Special Requests   Final    BOTTLES DRAWN AEROBIC AND ANAEROBIC Blood Culture adequate volume   Culture   Final    NO GROWTH 5 DAYS Performed at [facility], [place] [address], [place], [place] [address]    Report Status 11/11/2021 FINAL  Final  Urine Culture     Status: None   Collection Time: 11/06/21  3:40 PM   Specimen: Urine, Random  Result Value Ref Range Status   Specimen Description URINE, RANDOM  Final   Special Requests NONE  Final   Culture   Final    NO GROWTH Performed at [facility], [place] [address], [place],  [address]    Report Status 11/07/2021 FINAL  Final    Coagulation Studies: No results for input(s): LABPROT, INR in the last 72 hours.  Urinalysis: No results for  input(s): COLORURINE, LABSPEC, PHURINE, GLUCOSEU, HGBUR, BILIRUBINUR, KETONESUR, PROTEINUR, UROBILINOGEN, NITRITE, LEUKOCYTESUR in the last 72 hours.  Invalid input(s): APPERANCEUR    Imaging: No results found.   Medications:     prismasol BGK 4/2.5 800 mL/hr at  11/15/21 0433    prismasol BGK 4/2.5 400 mL/hr at 11/15/21 0847   sodium chloride 20 mL/hr at 11/07/21 1800   sodium chloride     sodium chloride 5 mL/hr at 11/10/21 0800   sodium chloride     sodium chloride Stopped (11/12/21 1932)   amiodarone 30 mg/hr (11/15/21 0900)   epinephrine 4 mcg/min (11/15/21 0900)   epoprostenol (VELETRI) for inhalation 1.47m/50mL (30,000 ng/mL) 40 ng/kg/min (11/15/21 0800)   feeding supplement (VITAL 1.5 CAL) 50 mL/hr at 11/15/21 0700   fentaNYL infusion INTRAVENOUS 175 mcg/hr (11/15/21 0900)   heparin 10,000 units/ 20 mL infusion syringe 500 Units/hr (11/15/21 0100)   lactated ringers 5 mL/hr at 11/15/21 0900   midazolam 6 mg/hr (11/15/21 0900)   norepinephrine (LEVOPHED) Adult infusion 17 mcg/min (11/15/21 0900)   prismasol BGK 4/2.5 1,500 mL/hr at 11/15/21 0830   vasopressin 0.04 Units/min (11/15/21 0900)    acetaminophen (TYLENOL) oral liquid 160 mg/5 mL  650 mg Per Tube Q6H   artificial tears  1 application. Both Eyes Q8H   aspirin EC  325 mg Oral Daily   Or   aspirin  324 mg Per Tube Daily   atorvastatin  10 mg Per Tube q AM   B-complex with vitamin C  1 tablet Per Tube Daily   bisacodyl  10 mg Oral Daily   Or   bisacodyl  10 mg Rectal Daily   buPROPion  75 mg Per Tube BID   chlorhexidine gluconate (MEDLINE KIT)  15 mL Mouth Rinse BID   Chlorhexidine Gluconate Cloth  6 each Topical Daily   docusate  100 mg Per Tube BID   feeding supplement (PROSource TF)  45 mL Per Tube TID   folic acid  1 mg Per Tube Daily   insulin aspart  0-24 Units Subcutaneous Q4H   insulin detemir  10 Units Subcutaneous BID   mouth rinse  15 mL Mouth Rinse 10 times per day   pantoprazole sodium  40 mg  Per Tube Daily   polyethylene glycol  17 g Per Tube Daily   sennosides  10 mL Per Tube BID   sodium chloride flush  10-40 mL Intracatheter Q12H   sodium chloride flush  3 mL Intravenous Q12H   venlafaxine  75 mg Per Tube BID WC   sodium chloride, Place/Maintain arterial line **AND** sodium chloride, sodium chloride, fentaNYL, heparin, lip balm, midazolam, ondansetron (ZOFRAN) IV, rocuronium bromide, sodium chloride, sodium chloride flush, sodium chloride flush  Assessment/ Plan:  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  Started on CRRT 11/08/2021 with right femoral temporal dialysis catheter placed.  Continue on CRRT.  Patient required multiple pressors ANEMIA-no ESA's at this point. MBD-appears stable HTN/VOL-continue ultrafiltration.  As per critical care service ACCESS-femoral dialysis catheter Hypophosphatemia.  Phosphorus appears to have improved    LOS: 1[place]_0 _1 :26 AM

## 2021-11-15 NOTE — Progress Notes (Signed)
Advanced Heart Failure Rounding Note  PCP-Cardiologist: None   Subjective:   02/20: Transferred to [facility] for GI bleed + pericardial effusion 02/22: R/LHC with normal cors, equalized R and L sided pressures with no evidence of LV-RV interaction, RA 17, PA 33/21 (25), Fick CO/CI 4.2/2.2, severely reduced PAPi (in setting of severe TR), PA sat 47%, 48% 2/24: S/P Two polyps in the distal ascending colon, removed with a cold snare. Resected and retrieved. Right colonic ischemic ulcers (healing, no active bleeding, biopsied)-likely etiology of LGI. Moderate sigmoid diverticulosis. 2/25: Diuresed with IV lasix.  2/26: Switched to torsemide 60 /60 .  2/28: S/P TVR . Off pump had VT/VT when PA catheter placed. Had cardiac massage. Cardioverted x2. Unsuccessful. Placed back on pump with restoration of SR. Started on IV amiodarone.  3/1: Extubated 3/2: Reintubated for acute hypoxic respiratory failure. Stat CXR ? Possible b/l PNA. Concern for developing septic shock, WBC 39K. PCT 2.49. Increased pressor requirements w/ NE and VP. Milrinone discontinued w/ low SVR. Several rounds of bicarb for persistent metabolic acidosis. Started on vanc + zosyn. 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 LVEF 45-50%, D-shaped interventricular septum, RV moderately reduced, trivial TR   03/09: s/p 1 u RBCs for hgb 7.1  Remains intubated and sedated. Remains on 70% FiO2. (Failed wean to 60%) this am   Pressor requirements the same, NE 15, Epi 4, Vaso 0.04. Veletri now off   Coox 44%   On CVVH, pulling for 50-100/hr. Weight down another 8 pounds (37 total) - still 15 pounds above pre-op    Objective:   Weight Range: 92 kg Body mass index is 37.1 kg/m.   Vital Signs:   Temp:  [93.2 F (34 C)-100.6 F (38.1 C)] 99.1 F (37.3 C) (03/11 0915) Pulse Rate:  [78-81] 80 (03/11  0915) Resp:  [15-33] 28 (03/11 0915) BP: (72-112)/(34-58) 72/58 (03/11 0800) SpO2:  [83 %-99 %] 95 % (03/11 1006) Arterial Line BP: (95-123)/(45-58) 106/46 (03/11 0915) FiO2 (%):  [60 %-80 %] 70 % (03/11 1006) Weight:  [92 kg] 92 kg (03/11 0450) Last BM Date : 11/15/21  Weight change: Filed Weights   11/13/21 0400 11/14/21 0232 11/15/21 0450  Weight: 98 kg 95.6 kg 92 kg    Intake/Output:   Intake/Output Summary (Last 24 hours) at 11/15/2021 1140 Last data filed at 11/15/2021 1100 Gross per 24 hour  Intake 3363.65 ml  Output 6729 ml  Net -3365.35 ml       Physical Exam   General:  Sedated on vent, critically ill appearing HEENT: + ETT, + CorTrak Neck: supple. RIJ swan  + HD cath  Carotids 2+ bilat; no bruits. No lymphadenopathy or thryomegaly appreciated. Cor: PMI nondisplaced. Regular rate & rhythm. No rubs, gallops or murmurs. Lungs: coarse Abdomen: soft, nontender, nondistended. No hepatosplenomegaly. No bruits or masses. Good bowel sounds. Extremities: no cyanosis, clubbing, rash, 2+ edema Neuro: sedated on vent   Telemetry   AV paced 80 Personally reviewed   Labs    CBC Recent Labs    11/14/21 0231 11/15/21 0427  WBC 16.8* 22.5*  NEUTROABS 14.8*  --   HGB 8.4* 9.1*  HCT 28.9* 31.0*  MCV 96.7 98.1  PLT 169 081    Basic Metabolic Panel Recent Labs    11/14/21 0231 11/14/21 1634 11/15/21 0427  NA 133* 134* 134*  K 4.4 4.5 4.9  CL  100 99 100  CO2 22 23 22   GLUCOSE 173* 180* 150*  BUN 20 17 19   CREATININE 0.82 0.79 0.78  CALCIUM 8.4* 8.7* 9.0  MG 2.4  --  2.6*  PHOS 2.2* 2.9 2.8    Liver Function Tests Recent Labs    11/14/21 1634 11/15/21 0427  ALBUMIN 2.3* 2.5*    No results for input(s): LIPASE, AMYLASE in the last 72 hours. Cardiac Enzymes No results for input(s): CKTOTAL, CKMB, CKMBINDEX, TROPONINI in the last 72 hours.  BNP: BNP (last 3 results) Recent Labs    02/24/21 1357 07/16/21 1145 10/22/2021 2138  BNP 326.4* 523.7*  477.9*     ProBNP (last 3 results) No results for input(s): PROBNP in the last 8760 hours.   D-Dimer No results for input(s): DDIMER in the last 72 hours. Hemoglobin A1C No results for input(s): HGBA1C in the last 72 hours.  Fasting Lipid Panel No results for input(s): CHOL, HDL, LDLCALC, TRIG, CHOLHDL, LDLDIRECT in the last 72 hours.   Thyroid Function Tests No results for input(s): TSH, T4TOTAL, T3FREE, THYROIDAB in the last 72 hours.  Invalid input(s): FREET3   Other results:   Imaging    DG CHEST PORT 1 VIEW  Result Date: 11/15/2021 CLINICAL DATA:  Acute respiratory failure. Endotracheal tube and bilateral chest tubes present. EXAM: PORTABLE CHEST 1 VIEW COMPARISON:  11/13/2021 FINDINGS: Support lines and tubes remain in appropriate position. No pneumothorax visualized. Triple lead pacemaker again noted. Heart size is within normal limits. Symmetric bilateral pulmonary airspace disease shows no significant change. 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 IMPRESSION: Support lines and tubes in appropriate position. No pneumothorax visualized. Stable symmetric bilateral pulmonary airspace disease. Electronically Signed   By: Marlaine Hind M.D.   On: 11/15/2021 10:23     Medications:     Scheduled Medications:  acetaminophen (TYLENOL) oral liquid 160 mg/5 mL  650 mg Per Tube Q6H   artificial tears  1 application. Both Eyes Q8H   aspirin EC  325 mg Oral Daily   Or   aspirin  324 mg Per Tube Daily   atorvastatin  10 mg Per Tube q AM   B-complex with vitamin C  1 tablet Per Tube Daily   bisacodyl  10 mg Oral Daily   Or   bisacodyl  10 mg Rectal Daily   buPROPion  75 mg Per Tube BID   chlorhexidine gluconate (MEDLINE KIT)  15 mL Mouth Rinse BID   Chlorhexidine Gluconate Cloth  6 each Topical Daily   docusate  100 mg Per Tube BID   feeding supplement (PROSource TF)  45 mL Per Tube TID   folic acid  1 mg Per Tube Daily   insulin aspart  0-24 Units  Subcutaneous Q4H   insulin detemir  10 Units Subcutaneous BID   mouth rinse  15 mL Mouth Rinse 10 times per day   pantoprazole sodium  40 mg Per Tube Daily   polyethylene glycol  17 g Per Tube Daily   sennosides  10 mL Per Tube BID   sodium chloride flush  10-40 mL Intracatheter Q12H   sodium chloride flush  3 mL Intravenous Q12H   venlafaxine  75 mg Per Tube BID WC    Infusions:   prismasol BGK 4/2.5 800 mL/hr at 11/15/21 1051    prismasol BGK 4/2.5 400 mL/hr at 11/15/21 0847   sodium chloride 20 mL/hr at 11/07/21 1800   sodium chloride     sodium chloride  5 mL/hr at 11/10/21 0800   sodium chloride     sodium chloride Stopped (11/12/21 1932)   amiodarone 30 mg/hr (11/15/21 1100)   epinephrine 4 mcg/min (11/15/21 1100)   epoprostenol (VELETRI) for inhalation 1.9m/50mL (30,000 ng/mL) 30 ng/kg/min (11/15/21 1006)   feeding supplement (VITAL 1.5 CAL) 50 mL/hr at 11/15/21 0700   fentaNYL infusion INTRAVENOUS 175 mcg/hr (11/15/21 1100)   heparin 10,000 units/ 20 mL infusion syringe 500 Units/hr (11/15/21 0100)   lactated ringers 5 mL/hr at 11/15/21 1100   midazolam 6 mg/hr (11/15/21 1100)   norepinephrine (LEVOPHED) Adult infusion 15 mcg/min (11/15/21 1100)   prismasol BGK 4/2.5 1,500 mL/hr at 11/15/21 0830   vasopressin 0.04 Units/min (11/15/21 1100)     PRN Medications: sodium chloride, Place/Maintain arterial line **AND** sodium chloride, sodium chloride, fentaNYL, heparin, lip balm, midazolam, ondansetron (ZOFRAN) IV, rocuronium bromide, sodium chloride, sodium chloride flush, sodium chloride flush     Patient Profile    65y.o. female with h/o PAF with recent failed ablation S/p Watchman implant 08/21/21,  breast CA in 1998 s/p left breast surgery and s/p Adriamycin x 4 cycles, non-ischemic cardiomyopathy/chronic systolic CHF, LBBB, s/p BiV Medtronic ICD, HTN, OSA, and CKD Stage IIIb. Transferred from OSH for acute GIB and pericardial effusion. Initial Echo here showed EF  35-40% RV severely enlarged w/ low normal systolic fx. Moderate posterior effusion, no tamponade. Severe TR noted. R/LCH showed normal cors, LVEF 40%, Equalized right and left-sided pressure with no evidence of LV-RV interaction and evidence of severe RV failure w/ severely reduced PAPi, 0.6, in setting of severe TR. Went to OR 2/28 for TVR. Off pump had VT/VF when PA catheter placed. Had cardiac massage. Cardioverted x2. Unsuccessful. Placed back on pump with restoration of SR. Started on IV amiodarone.   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 Acute on Chronic Systolic CHF w/ Post Cardiotomy Shock  - felt to have LBBB CM vs chemo induced (got adriamycin). 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 No effusion -Echo 02/21: EF 35-40%, RV severely enlarged with okay function, moderate MR, severe TR with flail posterior leaflet -RHC 02/22: RA 17, PA 33/21 (25), PCWP 18, PAPi 0.6 (in setting of severe TR), PA sat 47%, 48% -S/p TVR 2/28. Intraop TEE EF 40-45%, RV mildly reduced.  VT arrest when coming off pump  - Mixed septic/cardiogenic shock post-operatively - Milrinone discontinued 3/2 w/ low SVR - Repeat limited echo 3/6 EF 45-50%, RV mod reduced, R>>L atrial level shunt  - Remains on triple pressors/intotropes Epi 4, NE 15, VP 0.04. No change overnight. Co-ox remains low, 44%  - Completed Broad spectrum vanc/Zosyn. Pan-cultures NGTD.  - Still volume overloaded. Tolerating CVVHD, goal to pulll-100-150/hr - Continue to follow hemodynamics off swan  3. Acute Hypoxic Respiratory Failure - Extubated 3/1 - Reintubated 3/2. CXR ? B/l PNA  - PCT 2.49>>3.66>>4.15 - Covered w/ empiric abx, Vanc +  zosyn  - Cultures NGTD - CXR with bilateral diffuse infiltrates, suspect PNA + volume overload/pulmonary edema. FiO2 70%. Unable to wean to 60% Continue to pull fluid - Fluid removal via CVVH.  - Appreciate CCM's management   4. PAF/tachycardia - Unable to  complete ablation 10/22 d/t pericardial effusion - S/p Watchman implant 08/21/21 - continue amio gtt 30 mg/hr while on inotropes/ pressors  Remains AV paced  - heparin through CVVHD circuit   5. AKI on CKD IIIb/IV/ ATN  - Suspect ATN.  - Marked volume overload with no response to IV diuretics => now on CVVH. Nephrology following    6. GI bleeding - BRBPR + melena 4-5 days prior to admission - Per her report CT at OSH with diverticulosis but no diverticulitis - 1 u PRBCs this admit -EGD/Colonoscopy- R colonic ischemic ulcers - suspect in the setting of low output hf., 2 polyps removed, diverticulosis. Polyps no malignancy   - On PPI  -Transfuse < 8.  - 1 u RBCs 03/09 - Hgb 9.1 today  7. Hypokalemia/ hypomagnesia - K /Mag stable.   8. VT/VF -Intraoperative. Placed on amio drip.  -stable, no recurrence  -continue amio gtt -keep K >4.0 and Mg >2.0  9. 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 Making some progress with volume removal but still a way to go. Will wean pressors as tolerated but keep pulling -100-150cc hr. Respiratory status still very concerning. Will likely need trach next week.   CRITICAL CARE Performed by: Glori Bickers  Total critical care time: 35 minutes  Critical care time was exclusive of separately billable procedures and treating other patients.  Critical care was necessary to treat or prevent imminent or life-threatening deterioration.  Critical care was time spent personally by me (independent of midlevel providers or residents) on the following activities:  development of treatment plan with patient and/or surrogate as well as nursing, discussions with consultants, evaluation of patient's response to treatment, examination of patient, obtaining history from patient or surrogate, ordering and performing treatments and interventions, ordering and review of laboratory studies, ordering and review of radiographic studies, pulse oximetry and re-evaluation of patient's condition.   Length of Stay: [place], MD  11/15/2021, 11:40 AM  Advanced Heart Failure Team Pager [phone] (M-F; 7a - 5p)  Please contact [facility] for night-coverage after hours (5p -7a ) and weekends on [email or web address]

## 2021-11-15 NOTE — Progress Notes (Signed)
? ?   ?[address] ?      [place] [address] ?            [phone]   ?              ?11 Days Post-Op ?Procedure(s) (LRB): ?TRICUSPID VALVE REPAIR USING EDWARDS MC3 TRICUSPID ANNULOPLASTY RING SIZE 28MM (N/A) ?TRANSESOPHAGEAL ECHOCARDIOGRAM (TEE) (N/A) ? ? ?Events: ?No events ?_______________________________________________________________ ?Vitals: ?BP (!) 72/58 (BP Location: Left Arm)   Pulse 80   Temp 99.1 ?F (37.3 ?C)   Resp (!) 28   Ht '5\' 2"'$  (1.575 m)   Wt 92 kg   SpO2 95%   BMI 37.10 kg/m?  ?Filed Weights  ? 11/13/21 0400 11/14/21 0232 11/15/21 0450  ?Weight: 98 kg 95.6 kg 92 kg  ? ? ? ?- Neuro: sedated ? ?- Cardiovascular: paced ? Drips: epi 4, levo 17, vaso 0.04.   ?PAP: (42-52)/(21-31) 52/31 ?CVP:  [9 mmHg-24 mmHg] 17 mmHg ?PCWP:  [22 mmHg] 22 mmHg ?CO:  [3.9 L/min-4.8 L/min] 3.9 L/min ?CI:  [2 L/min/m2-2.5 L/min/m2] 2 L/min/m2 ? ?- Pulm:  ?Vent Mode: PRVC ?FiO2 (%):  [60 %-80 %] 70 % ?Set Rate:  [32 bmp] 32 bmp ?Vt Set:  [400 mL] 400 mL ?PEEP:  [16 cmH20] 16 cmH20 ?Plateau Pressure:  [34 cmH20-38 cmH20] 34 cmH20 ? ?ABG ?   ?Component Value Date/Time  ? PHART 7.261 (L) 11/13/2021 1019  ? PCO2ART 52.4 (H) 11/13/2021 1019  ? PO2ART 114 (H) 11/13/2021 1019  ? HCO3 23.7 11/13/2021 1019  ? TCO2 25 11/13/2021 1019  ? ACIDBASEDEF 4.0 (H) 11/13/2021 1019  ? O2SAT 41 11/15/2021 0616  ? ? ?- Abd: ND ?- Extremity: warm ? ?Marland KitchenIntake/Output   ?   03/10 0701 ?03/11 0700 03/11 0701 ?03/12 0700  ? P.O.    ? I.V. (mL/kg) 1854.8 (20.2) 160 (1.7)  ? Blood    ? Other 10   ? NG/GT 1365 100  ? IV Piggyback 261.1   ? Total Intake(mL/kg) 3490.9 (37.9) 260 (2.8)  ? Other 6530 497  ? Stool 300   ? Chest Tube  0  ? Total Output 6830 497  ? Net -3339.1 -237  ?     ?  ? ? ?_______________________________________________________________ ?Labs: ?CBC Latest Ref Rng & Units 11/15/2021 11/14/2021 11/13/2021  ?WBC 4.0 - 10.5 K/uL 22.5(H) 16.8(H) -  ?Hemoglobin 12.0 - 15.0 g/dL 9.1(L) 8.4(L) 9.2(L)  ?Hematocrit 36.0 -  46.0 % 31.0(L) 28.9(L) 27.0(L)  ?Platelets 150 - 400 K/uL 202 169 -  ? ?CMP Latest Ref Rng & Units 11/15/2021 11/14/2021 11/14/2021  ?Glucose 70 - 99 mg/dL 150(H) 180(H) 173(H)  ?BUN 8 - 23 mg/dL '19 17 20  '$ ?Creatinine 0.44 - 1.00 mg/dL 0.78 0.79 0.82  ?Sodium 135 - 145 mmol/L 134(L) 134(L) 133(L)  ?Potassium 3.5 - 5.1 mmol/L 4.9 4.5 4.4  ?Chloride 98 - 111 mmol/L 100 99 100  ?CO2 22 - 32 mmol/L '22 23 22  '$ ?Calcium 8.9 - 10.3 mg/dL 9.0 8.7(L) 8.4(L)  ?Total Protein 6.5 - 8.1 g/dL - - -  ?Total Bilirubin 0.3 - 1.2 mg/dL - - -  ?Alkaline Phos 38 - 126 U/L - - -  ?AST 15 - 41 U/L - - -  ?ALT 0 - 44 U/L - - -  ? ? ?CXR: ?Stable bilateral interstitial process ? ?_______________________________________________________________ ? ?Assessment and Plan: ?POD 11 s/p TVR ? ?Neuro: sedated while on vent ?CV: stable on chemical support ?Pulm: vent per PCCM ?  Renal: on CRRT ?GI: receiving tube feeds ?Heme: stable ?ID: afebrile ?Endo: SSI ?Dispo: continue ICU care ? ? ?[name] [name] ?11/15/2021 10:53 AM ? ? ?

## 2021-11-16 DIAGNOSIS — J810 Acute pulmonary edema: Secondary | ICD-10-CM | POA: Diagnosis not present

## 2021-11-16 DIAGNOSIS — I3139 Other pericardial effusion (noninflammatory): Secondary | ICD-10-CM | POA: Diagnosis not present

## 2021-11-16 DIAGNOSIS — J80 Acute respiratory distress syndrome: Secondary | ICD-10-CM | POA: Diagnosis not present

## 2021-11-16 LAB — CBC WITH DIFFERENTIAL/PLATELET
Abs Immature Granulocytes: 0 10*3/uL (ref 0.00–0.07)
Basophils Absolute: 0 10*3/uL (ref 0.0–0.1)
Basophils Relative: 0 %
Eosinophils Absolute: 0 10*3/uL (ref 0.0–0.5)
Eosinophils Relative: 0 %
HCT: 31.7 % — ABNORMAL LOW (ref 36.0–46.0)
Hemoglobin: 9.1 g/dL — ABNORMAL LOW (ref 12.0–15.0)
Lymphocytes Relative: 2 %
Lymphs Abs: 0.6 10*3/uL — ABNORMAL LOW (ref 0.7–4.0)
MCH: 29 pg (ref 26.0–34.0)
MCHC: 28.7 g/dL — ABNORMAL LOW (ref 30.0–36.0)
MCV: 101 fL — ABNORMAL HIGH (ref 80.0–100.0)
Monocytes Absolute: 1.6 10*3/uL — ABNORMAL HIGH (ref 0.1–1.0)
Monocytes Relative: 5 %
Neutro Abs: 29.3 10*3/uL — ABNORMAL HIGH (ref 1.7–7.7)
Neutrophils Relative %: 93 %
Platelets: 208 10*3/uL (ref 150–400)
RBC: 3.14 MIL/uL — ABNORMAL LOW (ref 3.87–5.11)
RDW: 23 % — ABNORMAL HIGH (ref 11.5–15.5)
WBC: 31.5 10*3/uL — ABNORMAL HIGH (ref 4.0–10.5)
nRBC: 10 /100 WBC — ABNORMAL HIGH
nRBC: 16.7 % — ABNORMAL HIGH (ref 0.0–0.2)

## 2021-11-16 LAB — COOXEMETRY PANEL
Carboxyhemoglobin: 2.5 % — ABNORMAL HIGH (ref 0.5–1.5)
Carboxyhemoglobin: 2.3 % — ABNORMAL HIGH (ref 0.5–1.5)
Carboxyhemoglobin: 2.3 % — ABNORMAL HIGH (ref 0.5–1.5)
Methemoglobin: <0.7 % (ref 0.0–1.5)
Methemoglobin: 0.8 % (ref 0.0–1.5)
Methemoglobin: 1.3 % (ref 0.0–1.5)
O2 Saturation: 41.6 %
O2 Saturation: 36.3 %
O2 Saturation: 38.3 %
Total hemoglobin: 8.8 g/dL — ABNORMAL LOW (ref 12.0–16.0)
Total hemoglobin: 9.4 g/dL — ABNORMAL LOW (ref 12.0–16.0)
Total hemoglobin: 9 g/dL — ABNORMAL LOW (ref 12.0–16.0)

## 2021-11-16 LAB — RENAL FUNCTION PANEL
Albumin: 2.2 g/dL — ABNORMAL LOW (ref 3.5–5.0)
Albumin: 2.1 g/dL — ABNORMAL LOW (ref 3.5–5.0)
Anion gap: 12 (ref 5–15)
Anion gap: 11 (ref 5–15)
BUN: 20 mg/dL (ref 8–23)
BUN: 18 mg/dL (ref 8–23)
CO2: 20 mmol/L — ABNORMAL LOW (ref 22–32)
CO2: 22 mmol/L (ref 22–32)
Calcium: 8.5 mg/dL — ABNORMAL LOW (ref 8.9–10.3)
Calcium: 8.6 mg/dL — ABNORMAL LOW (ref 8.9–10.3)
Chloride: 98 mmol/L (ref 98–111)
Chloride: 103 mmol/L (ref 98–111)
Creatinine, Ser: 0.72 mg/dL (ref 0.44–1.00)
Creatinine, Ser: 0.77 mg/dL (ref 0.44–1.00)
GFR, Estimated: >60 mL/min (ref >60)
GFR, Estimated: >60 mL/min (ref >60)
Glucose, Bld: 229 mg/dL — ABNORMAL HIGH (ref 70–99)
Glucose, Bld: 180 mg/dL — ABNORMAL HIGH (ref 70–99)
Phosphorus: 2.3 mg/dL — ABNORMAL LOW (ref 2.5–4.6)
Phosphorus: 2.6 mg/dL (ref 2.5–4.6)
Potassium: 5 mmol/L (ref 3.5–5.1)
Potassium: 4.9 mmol/L (ref 3.5–5.1)
Sodium: 130 mmol/L — ABNORMAL LOW (ref 135–145)
Sodium: 136 mmol/L (ref 135–145)

## 2021-11-16 LAB — LACTIC ACID, PLASMA
Lactic Acid, Venous: 3.1 mmol/L (ref 0.5–1.9)
Lactic Acid, Venous: 3.9 mmol/L (ref 0.5–1.9)
Lactic Acid, Venous: 5.1 mmol/L (ref 0.5–1.9)

## 2021-11-16 LAB — GLUCOSE, CAPILLARY
Glucose-Capillary: 210 mg/dL — ABNORMAL HIGH (ref 70–99)
Glucose-Capillary: 187 mg/dL — ABNORMAL HIGH (ref 70–99)
Glucose-Capillary: 55 mg/dL — ABNORMAL LOW (ref 70–99)
Glucose-Capillary: 188 mg/dL — ABNORMAL HIGH (ref 70–99)
Glucose-Capillary: 184 mg/dL — ABNORMAL HIGH (ref 70–99)
Glucose-Capillary: 145 mg/dL — ABNORMAL HIGH (ref 70–99)
Glucose-Capillary: 185 mg/dL — ABNORMAL HIGH (ref 70–99)

## 2021-11-16 LAB — HEPARIN LEVEL (UNFRACTIONATED): Heparin Unfractionated: <0.1 IU/mL — ABNORMAL LOW (ref 0.30–0.70)

## 2021-11-16 LAB — APTT: aPTT: 46 seconds — ABNORMAL HIGH (ref 24–36)

## 2021-11-16 LAB — MAGNESIUM: Magnesium: 2.6 mg/dL — ABNORMAL HIGH (ref 1.7–2.4)

## 2021-11-16 MED ORDER — SODIUM BICARBONATE 8.4 % IV SOLN
INTRAVENOUS | Status: DC
  Filled 2021-11-16 (×4): qty 1000

## 2021-11-16 MED ORDER — DOBUTAMINE IN D5W 4-5 MG/ML-% IV SOLN
INTRAVENOUS | Status: AC
  Administered 2021-11-16: 2.5 ug/kg/min via INTRAVENOUS
  Filled 2021-11-16: qty 250

## 2021-11-16 MED ORDER — DOBUTAMINE IN D5W 4-5 MG/ML-% IV SOLN: 2.5000 ug/kg/min | INTRAVENOUS | Status: DC

## 2021-11-16 NOTE — Progress Notes (Addendum)
Advanced Heart Failure Rounding Note  PCP-Cardiologist: None   Subjective:   02/20: Transferred to [facility] for GI bleed + pericardial effusion 02/22: R/LHC with normal cors, equalized R and L sided pressures with no evidence of LV-RV interaction, RA 17, PA 33/21 (25), Fick CO/CI 4.2/2.2, severely reduced PAPi (in setting of severe TR), PA sat 47%, 48% 2/24: S/P Two polyps in the distal ascending colon, removed with a cold snare. Resected and retrieved. Right colonic ischemic ulcers (healing, no active bleeding, biopsied)-likely etiology of LGI. Moderate sigmoid diverticulosis. 2/25: Diuresed with IV lasix.  2/26: Switched to torsemide 60 /60 .  2/28: S/P TVR . Off pump had VT/VT when PA catheter placed. Had cardiac massage. Cardioverted x2. Unsuccessful. Placed back on pump with restoration of SR. Started on IV amiodarone.  3/1: Extubated 3/2: Reintubated for acute hypoxic respiratory failure. Stat CXR ? Possible b/l PNA. Concern for developing septic shock, WBC 39K. PCT 2.49. Increased pressor requirements w/ NE and VP. Milrinone discontinued w/ low SVR. Several rounds of bicarb for persistent metabolic acidosis. Started on vanc + zosyn. 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 LVEF 45-50%, D-shaped interventricular septum, RV moderately reduced, trivial TR   03/09: s/p 1 u RBCs for hgb 7.1  Remains intubated and sedated. On CVVHD an multiple pressors.   Febrile despite meropenem and vanc. WBC rising 16 -> 22 -> 31. Pressor requirements increasing. Co-ox dropping now 36%. CVP 11-12   Swan #s CVP 9 PA 45/23 (30) PCWP 14 Thermo 3.5/1.8 SVR 1348 PVR 4.8   Objective:   Weight Range: 88.4 kg Body mass index is 35.65 kg/m.   Vital Signs:   Temp:  [97.3 F (36.3 C)-102.2 F (39 C)] 97.3 F (36.3 C) (03/12 0800) Pulse Rate:  [78-82] 79 (03/12  0800) Resp:  [16-34] 23 (03/12 0800) BP: (79-108)/(16-51) 79/36 (03/12 0800) SpO2:  [88 %-99 %] 95 % (03/12 0800) Arterial Line BP: (97-110)/(42-59) 98/47 (03/12 0800) FiO2 (%):  [40 %-70 %] 40 % (03/12 0800) Weight:  [88.4 kg] 88.4 kg (03/12 0412) Last BM Date : 11/15/21  Weight change: Filed Weights   11/14/21 0232 11/15/21 0450 11/16/21 0412  Weight: 95.6 kg 92 kg 88.4 kg    Intake/Output:   Intake/Output Summary (Last 24 hours) at 11/16/2021 0939 Last data filed at 11/16/2021 0800 Gross per 24 hour  Intake 3690.18 ml  Output 6600 ml  Net -2909.82 ml       Physical Exam   General:  Critically ill appearing on vent  HEENT: normal + ETT co-trak Neck: supple. RIJ swan. Cor: PMI nondisplaced. Regular rate & rhythm. No rubs, gallops or murmurs. + CTs Lungs: coarse Abdomen: soft, nontender, +distended. No hepatosplenomegaly. No bruits or masses. Hypoactive bowel sounds. Extremities: no cyanosis, clubbing, rash, 1-2+ edema. Diffuse ecchymoses Neuro: intubated sedated    Telemetry   AV paced 80 Personally reviewed   Labs    CBC Recent Labs    11/14/21 0231 11/15/21 0427 11/16/21 0330  WBC 16.8* 22.5* 31.5*  NEUTROABS 14.8*  --  29.3*  HGB 8.4* 9.1* 9.1*  HCT 28.9* 31.0* 31.7*  MCV 96.7 98.1 101.0*  PLT 169 202 119    Basic Metabolic Panel Recent Labs    11/15/21 0427 11/15/21 1600 11/16/21 0330  NA 134* 134* 130*  K 4.9 4.9 5.0  CL 100 101 98  CO2 22 24 20*  GLUCOSE 150* 156* 229*  BUN 19 19 20   CREATININE 0.78 0.83 0.72  CALCIUM 9.0 8.6* 8.5*  MG 2.6*  --  2.6*  PHOS 2.8 2.6 2.3*    Liver Function Tests Recent Labs    11/15/21 1600 11/16/21 0330  ALBUMIN 2.3* 2.2*    No results for input(s): LIPASE, AMYLASE in the last 72 hours. Cardiac Enzymes No results for input(s): CKTOTAL, CKMB, CKMBINDEX, TROPONINI in the last 72 hours.  BNP: BNP (last 3 results) Recent Labs    02/24/21 1357 07/16/21 1145 10/19/2021 2138  BNP 326.4*  523.7* 477.9*     ProBNP (last 3 results) No results for input(s): PROBNP in the last 8760 hours.   D-Dimer No results for input(s): DDIMER in the last 72 hours. Hemoglobin A1C No results for input(s): HGBA1C in the last 72 hours.  Fasting Lipid Panel No results for input(s): CHOL, HDL, LDLCALC, TRIG, CHOLHDL, LDLDIRECT in the last 72 hours.   Thyroid Function Tests No results for input(s): TSH, T4TOTAL, T3FREE, THYROIDAB in the last 72 hours.  Invalid input(s): FREET3   Other results:   Imaging    No results found.   Medications:     Scheduled Medications:  acetaminophen (TYLENOL) oral liquid 160 mg/5 mL  650 mg Per Tube Q6H   artificial tears  1 application. Both Eyes Q8H   aspirin EC  325 mg Oral Daily   Or   aspirin  324 mg Per Tube Daily   atorvastatin  10 mg Per Tube q AM   B-complex with vitamin C  1 tablet Per Tube Daily   bisacodyl  10 mg Oral Daily   Or   bisacodyl  10 mg Rectal Daily   buPROPion  75 mg Per Tube BID   chlorhexidine gluconate (MEDLINE KIT)  15 mL Mouth Rinse BID   Chlorhexidine Gluconate Cloth  6 each Topical Daily   docusate  100 mg Per Tube BID   feeding supplement (PROSource TF)  45 mL Per Tube TID   folic acid  1 mg Per Tube Daily   insulin aspart  0-24 Units Subcutaneous Q4H   insulin detemir  10 Units Subcutaneous BID   mouth rinse  15 mL Mouth Rinse 10 times per day   pantoprazole sodium  40 mg Per Tube Daily   polyethylene glycol  17 g Per Tube Daily   sennosides  10 mL Per Tube BID   sodium chloride flush  10-40 mL Intracatheter Q12H   sodium chloride flush  3 mL Intravenous Q12H   venlafaxine  75 mg Per Tube BID WC    Infusions:   prismasol BGK 4/2.5 800 mL/hr at 11/16/21 0008    prismasol BGK 4/2.5 400 mL/hr at 11/16/21 0743   sodium chloride 20 mL/hr at 11/07/21 1800   sodium chloride     sodium chloride 5 mL/hr at 11/10/21 0800   sodium chloride     sodium chloride Stopped (11/12/21 1932)   amiodarone 30  mg/hr (11/16/21 0918)   epinephrine 5 mcg/min (11/16/21 0800)   epoprostenol (VELETRI) for inhalation 1.75m/50mL (30,000 ng/mL) Stopped (11/15/21 1606)   feeding supplement (VITAL 1.5 CAL) 50 mL/hr at 11/16/21 0700   fentaNYL infusion INTRAVENOUS 100 mcg/hr (11/16/21 0800)   heparin 10,000 units/ 20 mL infusion syringe 500 Units/hr (11/16/21 0800)   lactated ringers 5 mL/hr at 11/16/21 0800   meropenem (MERREM) IV Stopped (11/16/21 0546)   midazolam 4 mg/hr (11/16/21 0800)   norepinephrine (LEVOPHED) Adult infusion 22  mcg/min (11/16/21 0800)   prismasol BGK 4/2.5 1,500 mL/hr at 11/16/21 0843   vancomycin     vasopressin 0.04 Units/min (11/16/21 0800)     PRN Medications: sodium chloride, Place/Maintain arterial line **AND** sodium chloride, sodium chloride, fentaNYL, heparin, lip balm, midazolam, ondansetron (ZOFRAN) IV, rocuronium bromide, sodium chloride, sodium chloride flush, sodium chloride flush     Patient Profile    66 y.o. female with h/o PAF with recent failed ablation S/p Watchman implant 08/21/21,  breast CA in 1998 s/p left breast surgery and s/p Adriamycin x 4 cycles, non-ischemic cardiomyopathy/chronic systolic CHF, LBBB, s/p BiV Medtronic ICD, HTN, OSA, and CKD Stage IIIb. Transferred from OSH for acute GIB and pericardial effusion. Initial Echo here showed EF 35-40% RV severely enlarged w/ low normal systolic fx. Moderate posterior effusion, no tamponade. Severe TR noted. R/LCH showed normal cors, LVEF 40%, Equalized right and left-sided pressure with no evidence of LV-RV interaction and evidence of severe RV failure w/ severely reduced PAPi, 0.6, in setting of severe TR. Went to OR 2/28 for TVR. Off pump had VT/VF when PA catheter placed. Had cardiac massage. Cardioverted x2. Unsuccessful. Placed back on pump with restoration of SR. Started on IV amiodarone.   Assessment/Plan   1.Severe TR---> 2/28 S/P TVR  - intraoperative TEE showed mild regurgitation post repair -  Trivial TR on limited echo 3/6   2. Acute on Chronic Systolic CHF w/ Post Cardiotomy Shock  - felt to have LBBB CM vs chemo induced (got adriamycin). 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 No effusion -Echo 02/21: EF 35-40%, RV severely enlarged with okay function, moderate MR, severe TR with flail posterior leaflet -RHC 02/22: RA 17, PA 33/21 (25), PCWP 18, PAPi 0.6 (in setting of severe TR), PA sat 47%, 48% -S/p TVR 2/28. Intraop TEE EF 40-45%, RV mildly reduced.  VT arrest when coming off pump  - Continues with mixed septic/cardiogenic shock post-operatively - Repeat limited echo 3/6 EF 45-50%, RV mod reduced, R>>L atrial level shunt  - Remains on triple pressors/intotropes Epi 5, NE 22, VP 0.04. Pressor requirements going up with worsening co-ox. Luiz Blare numbers done personally. Add DBA.  - Now with rising WBC and fevers. On vanc/meropenum. CCM has consulted ID (agree) - Suspect ischemic bowel may be issue here. Check lactate  - Will keep even on CVVHD - Prognosis increasingly grim with MSOF. D/w husband at bedside. Will make limited code (no CPR)   3. Acute Hypoxic Respiratory Failure  - Extubated 3/1 - Reintubated 3/2. CXR with PNA -> felt to be aspiration - PCT 2.49>>3.66>>4.15 - Covered w/ empiric abx, Vanc + zosyn  - Cultures NGTD - CXR with persistent bilateral infiltrates. PNA +/- edema - Vanc and meropenum stated. ID consulted - Appreciate CMM management  4. PAF/tachycardia - Unable to complete ablation 10/22 d/t pericardial effusion - S/p Watchman implant 08/21/21 - continue amio gtt 30 mg/hr while on inotropes/ pressors  Remains AV paced  - heparin through CVVHD circuit   5. AKI on CKD IIIb/IV/ ATN  - Suspect ATN.  - Remains on CVVHD. Weight down to pre-op weight.  -  Keep CVVHD even today - Nephrology following    6. GI bleeding - BRBPR + melena 4-5 days prior to admission -EGD/Colonoscopy- R colonic ischemic ulcers -  suspect in the setting of low output hf., 2 polyps removed, diverticulosis. Polyps no malignancy   - On PPI  -Transfuse < 8.  - 1 u RBCs 03/09 - Hgb 9.1 today  7. Hypokalemia/ hypomagnesia - K /Mag stable.   8. VT/VF -Intraoperative. Placed on amio drip.  -stable, no recurrence  -continue amio gtt -keep K >4.0 and Mg >2.0  9. 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 Limited code - no CPR. Everything else ok   Remains critically ill. Prognosis worsening with persistent shock and MSOF. We may be reaching the limits of what we can do for her.  CRITICAL CARE Performed by: Glori Bickers  Total critical care time: 40 minutes  Critical care time was exclusive of separately billable procedures and treating other patients.  Critical care was necessary to treat or prevent imminent or life-threatening deterioration.  Critical care was time spent personally by me (independent of midlevel providers or residents) on the following activities: development of treatment plan with patient and/or surrogate as well as nursing, discussions with consultants, evaluation of patient's response to treatment, examination of patient, obtaining history from patient or surrogate, ordering and performing treatments and interventions, ordering and review of laboratory studies, ordering and review of radiographic studies, pulse oximetry and re-evaluation of patient's condition.   Length of Stay: [place], MD  11/16/2021, 9:39 AM  Advanced Heart Failure Team Pager [phone] (M-F; 7a - 5p)  Please contact [facility] for night-coverage after hours (5p -7a ) and weekends on [email or web address]

## 2021-11-16 NOTE — Progress Notes (Signed)
? ?  Patient with worsening lactic acidosis and increasing pressor requirements. Suspect possible ischemic bowel.  ? ?Remains intubated/sedated. Ab distended on exam  ? ?Will start bicarb gtt and follow.  ? ?Appreciate ID input.  ? ?Little left to offer at this point.  ? ?I updated her husband ? ?Additional 35 mins CCT. ? ?Glori Bickers, MD  ?8:11 PM ? ?

## 2021-11-16 NOTE — Progress Notes (Signed)
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  Placed on CRRT for volume control.  Hemodynamics stable on epinephrine, norepinephrine and vasopressin.  Amiodarone drip for atrial fibrillation  Blood pressure 76/36 pulse 80 temperature 97 CVP 12 O2 sats 94% FiO2 40%  Negative fluid balance as tolerated  Sodium 130 potassium 5 chloride 98 CO2 20 BUN 20 creatinine 0.7 glucose 229 calcium 8.5 phosphorus 2.3 magnesium 2.6 hemoglobin 9.1   Objective:  Vital signs in last 24 hours:  Temp:  [97.3 F (36.3 C)-102.2 F (39 C)] 97.3 F (36.3 C) (03/12 0800) Pulse Rate:  [78-82] 79 (03/12 0800) Resp:  [16-34] 23 (03/12 0800) BP: (79-108)/(16-51) 79/36 (03/12 0800) SpO2:  [88 %-99 %] 95 % (03/12 0800) Arterial Line BP: (97-110)/(42-59) 98/47 (03/12 0800) FiO2 (%):  [40 %-70 %] 40 % (03/12 0800) Weight:  [88.4 kg] 88.4 kg (03/12 0412)  Weight change: -3.6 kg Filed Weights   11/14/21 0232 11/15/21 0450 11/16/21 0412  Weight: 95.6 kg 92 kg 88.4 kg    Intake/Output: I/O last 3 completed shifts: In: 5584.2 [I.V.:2749.4; Other:10; NG/GT:2175; IV Piggyback:649.8] Out: 10435 [Emesis/NG output:70; RKYHC:6237; Stool:700]   Intake/Output this shift:  Total I/O In: 75.3 [I.V.:75.3] Out: 191 [Other:191]  Critically ill intubated sedated CVS- RRR ET tube and core track RS- CTA JVP elevated intubated ABD- BS present soft non-distended EXT- no edema   Basic Metabolic Panel: Recent Labs  Lab 11/12/21 0358 11/12/21 0859 11/13/21 0330 11/13/21 0706 11/14/21 0231 11/14/21 1634 11/15/21 0427 11/15/21 1600 11/16/21 0330  NA 133*   < > 136   < > 133* 134* 134* 134* 130*  K 3.9   < > 4.6   < > 4.4 4.5 4.9 4.9 5.0  CL 100   < > 102   < > 100 99 100 101 98  CO2 23   < > 23   < > 22 23 22 24  20*  GLUCOSE 148*   < > 167*   < > 173* 180*  150* 156* 229*  BUN 18   < > 15   < > 20 17 19 19 20   CREATININE 0.75   < > 0.81   < > 0.82 0.79 0.78 0.83 0.72  CALCIUM 7.9*   < > 8.3*   < > 8.4* 8.7* 9.0 8.6* 8.5*  MG 2.4  --  2.4  --  2.4  --  2.6*  --  2.6*  PHOS 1.1*   < > 2.1*   < > 2.2* 2.9 2.8 2.6 2.3*   < > = values in this interval not displayed.     Liver Function Tests: Recent Labs  Lab 11/10/21 0431 11/10/21 1555 11/11/21 0312 11/11/21 1554 11/14/21 0231 11/14/21 1634 11/15/21 0427 11/15/21 1600 11/16/21 0330  AST 53*  --  61*  --   --   --   --   --   --   ALT 8  --  12  --   --   --   --   --   --   ALKPHOS 335*  --  429*  --   --   --   --   --   --   BILITOT 1.2  --  1.2  --   --   --   --   --   --   PROT 5.2*  --  5.7*  --   --   --   --   --   --   ALBUMIN 1.6*   < > 1.7*   < > 2.4* 2.3* 2.5* 2.3* 2.2*   < > = values in this interval not displayed.    No results for input(s): LIPASE, AMYLASE in the last 168 hours. Recent Labs  Lab 11/11/21 0312  AMMONIA 62*     CBC: Recent Labs  Lab 11/11/21 0312 11/11/21 0403 11/12/21 0358 11/12/21 0859 11/13/21 0343 11/13/21 0706 11/13/21 0801 11/13/21 1019 11/14/21 0231 11/15/21 0427 11/16/21 0330  WBC 20.7*  --  16.1*  --  14.5*  --   --   --  16.8* 22.5* 31.5*  NEUTROABS 17.2*  --   --   --   --   --   --   --  14.8*  --  29.3*  HGB 9.0*   < > 7.7*   < > 7.2*   < > 9.5* 9.2* 8.4* 9.1* 9.1*  HCT 29.6*   < > 26.5*   < > 25.9*   < > 28.0* 27.0* 28.9* 31.0* 31.7*  MCV 91.6  --  96.0  --  100.0  --   --   --  96.7 98.1 101.0*  PLT 161  --  152  --  161  --   --   --  169 202 208   < > = values in this interval not displayed.     Cardiac Enzymes: No results for input(s): CKTOTAL, CKMB, CKMBINDEX, TROPONINI in the last 168 hours.  BNP: Invalid input(s): POCBNP  CBG: Recent Labs  Lab 11/15/21 1947 11/15/21 2307 11/16/21 0335 11/16/21 0730 11/16/21 0735  GLUCAP 232* 235* 210* 39* 187*     Microbiology: Results for orders placed or  performed during the hospital encounter of 10/26/2021  MRSA Next Gen by PCR, Nasal     Status: None   Collection Time: 10/30/2021  8:41 PM   Specimen: Nasal Mucosa; Nasal Swab  Result Value Ref Range Status   MRSA by PCR Next Gen NOT DETECTED NOT DETECTED Final    Comment: (NOTE) The GeneXpert MRSA Assay (FDA approved for NASAL specimens only), is one component of a comprehensive MRSA colonization surveillance program. It is not intended to diagnose MRSA infection nor to guide or monitor treatment for MRSA infections. Test performance is not FDA approved in patients less than 48 years old. Performed at [facility], [place] [address], [place], [place] [address]   Resp Panel by RT-PCR (Flu A&B, Covid) Nasopharyngeal Swab     Status: None   Collection Time: 10/30/2021  9:13 PM   Specimen: Nasopharyngeal Swab; Nasopharyngeal(NP) swabs in vial transport medium  Result Value Ref Range Status   SARS Coronavirus 2 by RT PCR NEGATIVE NEGATIVE Final    Comment: (NOTE) SARS-CoV-2 target nucleic acids are NOT DETECTED.  The SARS-CoV-2 RNA is generally detectable in upper respiratory specimens during the acute phase of infection. The lowest concentration of SARS-CoV-2 viral copies this assay can detect is 138 copies/mL. A negative result does not preclude SARS-Cov-2 infection and should not be used as the sole basis for treatment or other patient management decisions. A negative result may occur with  improper specimen collection/handling, submission of specimen other than nasopharyngeal swab, presence of viral mutation(s) within the areas targeted by this assay, and inadequate number of viral copies(<138 copies/mL). A negative result must be combined with clinical observations, patient history, and  epidemiological information. The expected result is Negative.  Fact Sheet for Patients:  EntrepreneurPulse.com.au  Fact Sheet for Healthcare Providers:   IncredibleEmployment.be  This test is no t yet approved or cleared by the [place] FDA and  has been authorized for detection and/or diagnosis of SARS-CoV-2 by FDA under an Emergency Use Authorization (EUA). This EUA will remain  in effect (meaning this test can be used) for the duration of the COVID-19 declaration under Section 564(b)(1) of the Act, 21 U.S.C.section 360bbb-3(b)(1), unless the authorization is terminated  or revoked sooner.       Influenza A by PCR NEGATIVE NEGATIVE Final   Influenza B by PCR NEGATIVE NEGATIVE Final    Comment: (NOTE) The Xpert Xpress SARS-CoV-2/FLU/RSV plus assay is intended as an aid in the diagnosis of influenza from Nasopharyngeal swab specimens and should not be used as a sole basis for treatment. Nasal washings and aspirates are unacceptable for Xpert Xpress SARS-CoV-2/FLU/RSV testing.  Fact Sheet for Patients: EntrepreneurPulse.com.au  Fact Sheet for Healthcare Providers: IncredibleEmployment.be  This test is not yet approved or cleared by the [place] FDA and has been authorized for detection and/or diagnosis of SARS-CoV-2 by FDA under an Emergency Use Authorization (EUA). This EUA will remain in effect (meaning this test can be used) for the duration of the COVID-19 declaration under Section 564(b)(1) of the Act, 21 U.S.C. section 360bbb-3(b)(1), unless the authorization is terminated or revoked.  Performed at [facility], [place] [address], [place], [place] [address]   Culture, blood (routine x 2)     Status: None   Collection Time: 10/28/21  4:48 PM   Specimen: BLOOD  Result Value Ref Range Status   Specimen Description BLOOD SHOULDER RIGHT  Final   Special Requests   Final    BOTTLES DRAWN AEROBIC AND ANAEROBIC Blood Culture adequate volume   Culture   Final    NO GROWTH 5 DAYS Performed at [facility], [place] [address] [place], [place] [address]     Report Status 11/02/2021 FINAL  Final  Culture, blood (routine x 2)     Status: None   Collection Time: 10/28/21  5:01 PM   Specimen: BLOOD  Result Value Ref Range Status   Specimen Description BLOOD RIGHT ANTECUBITAL  Final   Special Requests   Final    BOTTLES DRAWN AEROBIC AND ANAEROBIC Blood Culture results may not be optimal due to an inadequate volume of blood received in culture bottles   Culture   Final    NO GROWTH 5 DAYS Performed at [facility], [place] [address], [place], [place] [address]    Report Status 11/02/2021 FINAL  Final  Surgical pcr screen     Status: None   Collection Time: 11/01/21  4:00 PM   Specimen: Nasal Mucosa; Nasal Swab  Result Value Ref Range Status   MRSA, PCR NEGATIVE NEGATIVE Final   Staphylococcus aureus NEGATIVE NEGATIVE Final    Comment: (NOTE) The Xpert SA Assay (FDA approved for NASAL specimens in patients 80 years of age and older), is one component of a comprehensive surveillance program. It is not intended to diagnose infection nor to guide or monitor treatment. Performed at [facility], [place] [address] [place], [place] [address]   MRSA Next Gen by PCR, Nasal     Status: None   Collection Time: 11/06/21  7:10 AM   Specimen: Nasal Mucosa; Nasal Swab  Result Value Ref Range Status   MRSA  by PCR Next Gen NOT DETECTED NOT DETECTED Final    Comment: (NOTE) The GeneXpert MRSA Assay (FDA approved for NASAL specimens only), is one component of a comprehensive MRSA colonization surveillance program. It is not intended to diagnose MRSA infection nor to guide or monitor treatment for MRSA infections. Test performance is not FDA approved in patients less than 78 years old. Performed at [facility], [place] [address], [place], [place] [address]   Culture, Respiratory w Gram Stain     Status: None   Collection Time: 11/06/21 12:08 PM   Specimen: Tracheal Aspirate  Result Value Ref Range Status   Specimen Description TRACHEAL  ASPIRATE  Final   Special Requests NONE  Final   Gram Stain   Final    ABUNDANT WBC PRESENT, PREDOMINANTLY PMN NO ORGANISMS SEEN    Culture   Final    RARE CANDIDA ALBICANS NO STAPHYLOCOCCUS AUREUS ISOLATED No Pseudomonas species isolated Performed at [facility], [place] [address] [place], [place] [address]    Report Status 11/09/2021 FINAL  Final  Culture, blood (Routine X 2) w Reflex to ID Panel     Status: None   Collection Time: 11/06/21  2:33 PM   Specimen: BLOOD  Result Value Ref Range Status   Specimen Description BLOOD RIGHT ANTECUBITAL  Final   Special Requests   Final    BOTTLES DRAWN AEROBIC AND ANAEROBIC Blood Culture adequate volume   Culture   Final    NO GROWTH 5 DAYS Performed at [facility], [place] [address], [place], [place] [address]    Report Status 11/11/2021 FINAL  Final  Culture, blood (Routine X 2) w Reflex to ID Panel     Status: None   Collection Time: 11/06/21  2:40 PM   Specimen: BLOOD RIGHT WRIST  Result Value Ref Range Status   Specimen Description BLOOD RIGHT WRIST  Final   Special Requests   Final    BOTTLES DRAWN AEROBIC AND ANAEROBIC Blood Culture adequate volume   Culture   Final    NO GROWTH 5 DAYS Performed at [facility], [place] [address], [place], [place] [address]    Report Status 11/11/2021 FINAL  Final  Urine Culture     Status: None   Collection Time: 11/06/21  3:40 PM   Specimen: Urine, Random  Result Value Ref Range Status   Specimen Description URINE, RANDOM  Final   Special Requests NONE  Final   Culture   Final    NO GROWTH Performed at [facility], [place] [address], [place], [place] [address]    Report Status 11/07/2021 FINAL  Final  MRSA Next Gen by PCR, Nasal     Status: None   Collection Time: 11/15/21  4:37 PM   Specimen: Nasal Mucosa; Nasal Swab  Result Value Ref Range Status   MRSA by PCR Next Gen NOT DETECTED NOT DETECTED Final    Comment: (NOTE) The GeneXpert MRSA Assay (FDA approved  for NASAL specimens only), is one component of a comprehensive MRSA colonization surveillance program. It is not intended to diagnose MRSA infection nor to guide or monitor treatment for MRSA infections. Test performance is not FDA approved in patients less than 70 years old. Performed at [facility], [place] [address], [place], [place] [address]   Expectorated Sputum Assessment w Gram Stain, Rflx to Resp Cult     Status: None   Collection Time: 11/15/21  4:37 PM  Specimen: Expectorated Sputum  Result Value Ref Range Status   Specimen Description EXPECTORATED SPUTUM  Final   Special Requests NONE  Final   Sputum evaluation   Final    THIS SPECIMEN IS ACCEPTABLE FOR SPUTUM CULTURE Performed at [facility], [address], [place], [place] [address]    Report Status 11/15/2021 FINAL  Final  Culture, Respiratory w Gram Stain     Status: None (Preliminary result)   Collection Time: 11/15/21  4:37 PM  Result Value Ref Range Status   Specimen Description EXPECTORATED SPUTUM  Final   Special Requests NONE Reflexed from [id number]  Final   Gram Stain   Final    FEW WBC PRESENT,BOTH PMN AND MONONUCLEAR RARE SQUAMOUS EPITHELIAL CELLS PRESENT RARE YEAST Performed at [facility], [place] [address], [place], [place] [address]    Culture PENDING  Incomplete   Report Status PENDING  Incomplete    Coagulation Studies: No results for input(s): LABPROT, INR in the last 72 hours.  Urinalysis: No results for input(s): COLORURINE, LABSPEC, PHURINE, GLUCOSEU, HGBUR, BILIRUBINUR, KETONESUR, PROTEINUR, UROBILINOGEN, NITRITE, LEUKOCYTESUR in the last 72 hours.  Invalid input(s): APPERANCEUR    Imaging: DG CHEST PORT 1 VIEW  Result Date: 11/15/2021 CLINICAL DATA:  Acute respiratory failure. Endotracheal tube and bilateral chest tubes present. EXAM: PORTABLE CHEST 1 VIEW COMPARISON:  11/13/2021 FINDINGS: Support lines and tubes remain in appropriate position. No pneumothorax  visualized. Triple lead pacemaker again noted. Heart size is within normal limits. Symmetric bilateral pulmonary airspace disease shows no significant change. 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 IMPRESSION: Support lines and tubes in appropriate position. No pneumothorax visualized. Stable symmetric bilateral pulmonary airspace disease. Electronically Signed   By: Marlaine Hind M.D.   On: 11/15/2021 10:23     Medications:     prismasol BGK 4/2.5 800 mL/hr at 11/16/21 0008    prismasol BGK 4/2.5 400 mL/hr at 11/16/21 0743   sodium chloride 20 mL/hr at 11/07/21 1800   sodium chloride     sodium chloride 5 mL/hr at 11/10/21 0800   sodium chloride     sodium chloride Stopped (11/12/21 1932)   amiodarone 30 mg/hr (11/16/21 0800)   epinephrine 5 mcg/min (11/16/21 0800)   epoprostenol (VELETRI) for inhalation 1.41m/50mL (30,000 ng/mL) Stopped (11/15/21 1606)   feeding supplement (VITAL 1.5 CAL) 50 mL/hr at 11/16/21 0700   fentaNYL infusion INTRAVENOUS 100 mcg/hr (11/16/21 0800)   heparin 10,000 units/ 20 mL infusion syringe 500 Units/hr (11/16/21 0800)   lactated ringers 5 mL/hr at 11/16/21 0800   meropenem (MERREM) IV Stopped (11/16/21 0546)   midazolam 4 mg/hr (11/16/21 0800)   norepinephrine (LEVOPHED) Adult infusion 22 mcg/min (11/16/21 0800)   prismasol BGK 4/2.5 1,500 mL/hr at 11/16/21 0843   vancomycin     vasopressin 0.04 Units/min (11/16/21 0800)    acetaminophen (TYLENOL) oral liquid 160 mg/5 mL  650 mg Per Tube Q6H   artificial tears  1 application. Both Eyes Q8H   aspirin EC  325 mg Oral Daily   Or   aspirin  324 mg Per Tube Daily   atorvastatin  10 mg Per Tube q AM   B-complex with vitamin C  1 tablet Per Tube Daily   bisacodyl  10 mg Oral Daily   Or   bisacodyl  10 mg Rectal Daily   buPROPion  75 mg Per Tube BID   chlorhexidine gluconate (MEDLINE KIT)  15 mL Mouth Rinse BID   Chlorhexidine  Gluconate Cloth  6 each Topical Daily   docusate  100 mg Per  Tube BID   feeding supplement (PROSource TF)  45 mL Per Tube TID   folic acid  1 mg Per Tube Daily   insulin aspart  0-24 Units Subcutaneous Q4H   insulin detemir  10 Units Subcutaneous BID   mouth rinse  15 mL Mouth Rinse 10 times per day   pantoprazole sodium  40 mg Per Tube Daily   polyethylene glycol  17 g Per Tube Daily   sennosides  10 mL Per Tube BID   sodium chloride flush  10-40 mL Intracatheter Q12H   sodium chloride flush  3 mL Intravenous Q12H   venlafaxine  75 mg Per Tube BID WC   sodium chloride, Place/Maintain arterial line **AND** sodium chloride, sodium chloride, fentaNYL, heparin, lip balm, midazolam, ondansetron (ZOFRAN) IV, rocuronium bromide, sodium chloride, sodium chloride flush, sodium chloride flush  Assessment/ Plan:  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  Started on CRRT 11/08/2021 with right femoral temporal dialysis catheter placed.  Continue on CRRT.  Patient required multiple pressors ANEMIA-no ESA's at this point. MBD-appears stable HTN/VOL-continue ultrafiltration.  As per critical care service ACCESS-femoral dialysis catheter Hypophosphatemia.  Phosphorus appears to have improved    LOS: [place] @TODAY @9 :13 AM

## 2021-11-16 NOTE — Progress Notes (Signed)
Removed patient pacing wires at 1400. Patient remained stable on vent. ? ?Donnetta Simpers, RN BSN ?

## 2021-11-16 NOTE — Consult Note (Signed)
[facility]       Reason for Consult:  Fever, leukocytosis   Referring Physician: Dr. Valeta Harms  Principal Problem:   Pericardial effusion Active Problems:   S/P tricuspid valve repair   ARDS (adult respiratory distress syndrome) (HCC)   Cardiogenic shock (HCC)   Acute pulmonary edema (HCC)   AKI (acute kidney injury) (HCC)   Septic shock (HCC)   Aspiration pneumonia (HCC)    acetaminophen (TYLENOL) oral liquid 160 mg/5 mL  650 mg Per Tube Q6H   artificial tears  1 application. Both Eyes Q8H   aspirin EC  325 mg Oral Daily   Or   aspirin  324 mg Per Tube Daily   atorvastatin  10 mg Per Tube q AM   B-complex with vitamin C  1 tablet Per Tube Daily   bisacodyl  10 mg Oral Daily   Or   bisacodyl  10 mg Rectal Daily   buPROPion  75 mg Per Tube BID   chlorhexidine gluconate (MEDLINE KIT)  15 mL Mouth Rinse BID   Chlorhexidine Gluconate Cloth  6 each Topical Daily   docusate  100 mg Per Tube BID   feeding supplement (PROSource TF)  45 mL Per Tube TID   folic acid  1 mg Per Tube Daily   insulin aspart  0-24 Units Subcutaneous Q4H   insulin detemir  10 Units Subcutaneous BID   mouth rinse  15 mL Mouth Rinse 10 times per day   pantoprazole sodium  40 mg Per Tube Daily   polyethylene glycol  17 g Per Tube Daily   sennosides  10 mL Per Tube BID   sodium chloride flush  10-40 mL Intracatheter Q12H   sodium chloride flush  3 mL Intravenous Q12H   venlafaxine  75 mg Per Tube BID WC    Recommendations:  Continue with vancomycin and meropenem Will follow blood culture results  Assessment: She has a fever and leukocytosis of unclear etiology.  CXR is mainly pulmonary edema but hard to tell if there is some pneumonia.  Possible aspiration.  She is at risk for line infections/bacteremia.  Possible ARDS.    Antibiotics: Vancomycin + meropenem  HPI: [name] is a 66 y.o. female with a history of non-ischemic cardiomyopathy from Adrianmycine  from remote breast cancer, paroxysmal afib came in to [facility] from an outside hospital on 2/20 due to a GI bleed and pericardial effusion.  She recently underwent a failed ablation s/p Watchman implant in December.  She was found to have severe tricuspid regurgitation and underwent tricuspid repair and ring annuplasty on 2/28 by Dr. Prescott Gum and initially extubated but required re-intubation with post op bilateral infiltrates and remains intubated.  She is on CRRT and prolonged pressor support needs due to cardiogenic shock.  She is developing worsening leukocytosis with a WBC of 31.5 today and Tmax of 102.2 yesterday.  Started on empiric vancomycin and meropenem yesterday.  Afebrile today.  Pacemaker in place.     Review of Systems:  Unable to be assessed due to patient factors  Past Medical History:  Diagnosis Date   AICD (automatic cardioverter/defibrillator) present    Biventricular implantable cardiac defibrillator)    Medtronic   Breast cancer ([place]) 1998   - left   CHF (congestive heart failure) ([place])    takes Lasix and Aldactone daily   Chronic kidney disease    Complication of anesthesia    n/v, none at last SURGERY-2022.  GERD (gastroesophageal reflux disease)    takes Omeprazole daily   Gout    takes Allopurinol daily   History of bronchitis 2 yrs ago   History of shingles    Hyperlipidemia    takes Simvastatin daily   Insomnia    takes Ambien nightly   Nonischemic cardiomyopathy ([place])    EF 55% echo 2012   NSVT (nonsustained ventricular tachycardia)    Obesity    OSA (obstructive sleep apnea)    Unable to wear CPAP   Pneumonia 2005   Presence of permanent cardiac pacemaker    Shortness of breath dyspnea    with exertion   sprint Fidelis     Social History   Tobacco Use   Smoking status: Never   Smokeless tobacco: Never  Vaping Use   Vaping Use: Never used  Substance Use Topics   Alcohol use: No   Drug use: No    Family History  Problem Relation  Age of Onset   Diabetes Mother    Heart disease Father    Alzheimer's disease Father     Allergies  Allergen Reactions   Buprenorphine Hcl Shortness Of Breath and Other (See Comments)    Patient required emergent use of naloxone and naloxone drip in ICU for only taking Percocet 5-325 over the course of one day. She appears to have impaired metabolism of opioid medications and providers should use these medications with extreme caution.   Morphine And Related Shortness Of Breath, Rash and Other (See Comments)    Patient required emergent use of naloxone and naloxone drip in ICU for only taking Percocet 5-325 over the course of one day. She appears to have impaired metabolism of opioid medications and providers should use these medications with extreme caution.  Mental status changes, also    Oxycodone Shortness Of Breath and Other (See Comments)    Mental status changes and Patient required emergent use of naloxone and naloxone drip in ICU for only taking Percocet 5-325 over the course of one day. She appears to have impaired metabolism of opioid medications and providers should use these medications with extreme caution.    Percocet [Oxycodone-Acetaminophen] Shortness Of Breath and Other (See Comments)    Hospitalized-  Patient required emergent use of naloxone and naloxone drip in ICU for only taking Percocet 5-325 over the course of one day. She appears to have impaired metabolism of opioid medications and providers should use these medications with extreme caution.     Physical Exam: Constitutional: no response  Vitals:   11/16/21 1015 11/16/21 1122  BP:  (!) 109/50  Pulse: 80 80  Resp: (!) 32 (!) 32  Temp: (!) 97.5 F (36.4 C)   SpO2: 93% 95%   EYES: anicteric ENMT: + ET Cardiovascular: Cor Tachy Respiratory: clear Skin: no rash  Lab Results  Component Value Date   WBC 31.5 (H) 11/16/2021   HGB 9.1 (L) 11/16/2021   HCT 31.7 (L) 11/16/2021   MCV 101.0 (H) 11/16/2021    PLT 208 11/16/2021    Lab Results  Component Value Date   CREATININE 0.72 11/16/2021   BUN 20 11/16/2021   NA 130 (L) 11/16/2021   K 5.0 11/16/2021   CL 98 11/16/2021   CO2 20 (L) 11/16/2021    Lab Results  Component Value Date   ALT 12 11/11/2021   AST 61 (H) 11/11/2021   ALKPHOS 429 (H) 11/11/2021     Microbiology: Recent Results (from the past 240 hour(s))  Culture,  Respiratory w Gram Stain     Status: None   Collection Time: 11/06/21 12:08 PM   Specimen: Tracheal Aspirate  Result Value Ref Range Status   Specimen Description TRACHEAL ASPIRATE  Final   Special Requests NONE  Final   Gram Stain   Final    ABUNDANT WBC PRESENT, PREDOMINANTLY PMN NO ORGANISMS SEEN    Culture   Final    RARE CANDIDA ALBICANS NO STAPHYLOCOCCUS AUREUS ISOLATED No Pseudomonas species isolated Performed at [facility], [place] [address] [place], [place] [address]    Report Status 11/09/2021 FINAL  Final  Culture, blood (Routine X 2) w Reflex to ID Panel     Status: None   Collection Time: 11/06/21  2:33 PM   Specimen: BLOOD  Result Value Ref Range Status   Specimen Description BLOOD RIGHT ANTECUBITAL  Final   Special Requests   Final    BOTTLES DRAWN AEROBIC AND ANAEROBIC Blood Culture adequate volume   Culture   Final    NO GROWTH 5 DAYS Performed at [facility], [place] [address], [place], [place] [address]    Report Status 11/11/2021 FINAL  Final  Culture, blood (Routine X 2) w Reflex to ID Panel     Status: None   Collection Time: 11/06/21  2:40 PM   Specimen: BLOOD RIGHT WRIST  Result Value Ref Range Status   Specimen Description BLOOD RIGHT WRIST  Final   Special Requests   Final    BOTTLES DRAWN AEROBIC AND ANAEROBIC Blood Culture adequate volume   Culture   Final    NO GROWTH 5 DAYS Performed at [facility], [place] [address] [place], [place] [address]    Report Status 11/11/2021 FINAL  Final  Urine Culture     Status: None   Collection Time: 11/06/21   3:40 PM   Specimen: Urine, Random  Result Value Ref Range Status   Specimen Description URINE, RANDOM  Final   Special Requests NONE  Final   Culture   Final    NO GROWTH Performed at [facility], [place] [address] [place], [place] [address]    Report Status 11/07/2021 FINAL  Final  MRSA Next Gen by PCR, Nasal     Status: None   Collection Time: 11/15/21  4:37 PM   Specimen: Nasal Mucosa; Nasal Swab  Result Value Ref Range Status   MRSA by PCR Next Gen NOT DETECTED NOT DETECTED Final    Comment: (NOTE) The GeneXpert MRSA Assay (FDA approved for NASAL specimens only), is one component of a comprehensive MRSA colonization surveillance program. It is not intended to diagnose MRSA infection nor to guide or monitor treatment for MRSA infections. Test performance is not FDA approved in patients less than 25 years old. Performed at [facility], [place] [address] [place], [place] [address]   Expectorated Sputum Assessment w Gram Stain, Rflx to Resp Cult     Status: None   Collection Time: 11/15/21  4:37 PM   Specimen: Expectorated Sputum  Result Value Ref Range Status   Specimen Description EXPECTORATED SPUTUM  Final   Special Requests NONE  Final   Sputum evaluation   Final    THIS SPECIMEN IS ACCEPTABLE FOR SPUTUM CULTURE Performed at [facility], [place] [address], [place],  [address]    Report Status 11/15/2021 FINAL  Final  Culture, Respiratory w Gram Stain     Status: None (Preliminary result)  Collection Time: 11/15/21  4:37 PM  Result Value Ref Range Status   Specimen Description EXPECTORATED SPUTUM  Final   Special Requests NONE Reflexed from [id number]  Final   Gram Stain   Final    FEW WBC PRESENT,BOTH PMN AND MONONUCLEAR RARE SQUAMOUS EPITHELIAL CELLS PRESENT RARE YEAST    Culture   Final    CULTURE REINCUBATED FOR BETTER GROWTH Performed at [facility], [place] [address], [place], [place] [address]    Report Status PENDING  Incomplete     Thayer Headings, [facility] [place] [email or web address] 11/16/2021, 12:01 PM

## 2021-11-16 NOTE — Progress Notes (Signed)
? ?NAME:  MAURICE RAMSEUR, MRN:  [id number], DOB:  09-03-1956, LOS: 28 ?ADMISSION DATE:  11/01/2021, CONSULTATION DATE:  11/06/21 ?REFERRING MD:  Nils Pyle, CHIEF COMPLAINT:  hypoxia  ? ?History of Present Illness:  ?Mrs. Donovan is a 66 year old woman with a history of HFrEF and paroxysmal atrial fibrillation who was transferred to [facility] with GI bleed and pericardial effusion.  She had recently undergone watchman insertion in January 2023.  She underwent EGD which did not explain bleeding.  Colonoscopy demonstrated sessile polyps, which were removed in the ascending colon and areas of ischemic colonic ulcers.  There was moderate sigmoid diverticulosis.  Heart catheterization demonstrated normal coronary arteries with equalization of left and right-sided heart pressures, severely reduced PAPi due to severe tricuspid regurgitation.  Swan-Ganz catheter was left in place to assist with weaning inotropic support.  Echocardiograms have previously demonstrated reduced RV function and dilation. On 2/28 she underwent Tricuspid repair and ring annuplasty. Post-op day 1 she was extubated but developed rapidly worsening respiratory failure overnight on 3/2 requiring re-intubation by anesthesia. CXR post-intubation had bilateral infiltrates. ? 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 ?Significant Hospital Events: ?Including procedures, antibiotic start and stop dates in addition to other pertinent events   ? ?2/20 admitted for pericardial effusion without tamponade ?2/22 L&R heart cath ?2/24 EGD & colonoscopy ?2/28 tricuspid valve repair; Vt arrest coming off bypass, back on bypass to stabilize ?3/1 extubated post op, reintubated overnight for hypoxia ?3/2 PCCM consulted for vent management.  Started on vancomycin and Zosyn for presumed aspiration pneumonia ?3/3 remains in a mixed picture of cardiogenic  and septic shock.  ?3/4 started on CRRT ?3/5>> improving pressor needs, stubborn O2 needs ?3/12 rising white blood cell count, fevers, infectious disease consultation ? ?Interim History / Subjective:  ? ?Critically ill. Intubated on mechanical life support.  ? ?Objective   ?Blood pressure (!) 79/36, pulse 79, temperature (!) 97.3 ?F (36.3 ?C), temperature source Core, resp. rate (!) 23, height '5\' 2"'$  (1.575 m), weight 88.4 kg, SpO2 95 %. ?PAP: (39-58)/(24-36) 43/27 ?CVP:  [0 mmHg-27 mmHg] 12 mmHg ?CO:  [3.7 L/min-4.6 L/min] 3.7 L/min ?CI:  [1.9 L/min/m2-2.4 L/min/m2] 1.9 L/min/m2  ?Vent Mode: PRVC ?FiO2 (%):  [40 %-70 %] 40 % ?Set Rate:  [32 bmp] 32 bmp ?Vt Set:  [400 mL] 400 mL ?PEEP:  [12 cmH20-16 cmH20] 12 cmH20 ?Plateau Pressure:  [33 cmH20-34 cmH20] 34 cmH20  ? ?Intake/Output Summary (Last 24 hours) at 11/16/2021 0919 ?Last data filed at 11/16/2021 0800 ?Gross per 24 hour  ?Intake 3690.18 ml  ?Output 6600 ml  ?Net -2909.82 ml  ? ?Filed Weights  ? 11/14/21 0232 11/15/21 0450 11/16/21 0412  ?Weight: 95.6 kg 92 kg 88.4 kg  ? ? ?Examination: ?General: Elderly female intubated on mechanical life support ?HEENT:  endotracheal tube in place, ?Extremities: Bilateral anasarca upper extremity lower extremity dependent edema ?Heart: Regular rate rhythm, S1-S2 ?Lungs: Bilateral mechanical ventilated breaths ? ? ?Resolved Hospital Problem list   ? ? ?Assessment & Plan:  ? ?Acute hypoxemic respiratory failure some combination aspiration, ARDS, CAP, pulmonary edema, shunting from septal defect due to watchman procedure ?Worsening Sepsis, Febrile, increasing white blood cell count remains cx neg, on broad spectrum Abx  ?LBBB +/- adriamycin-induced NICM ?Progressive right heart failure complicated by severe tricuspid regurgitation s/p  ?TVR 2/28.  TVR complicated by VT arrest coming off pump, brief. ?Fluid overloaded  state of heart. ?Renal failure with oliguria- septic ATN and cardiorenal now on CRRT ?Postoperative mixed  cardiogenic and septic shock- improved but still severe ?Pericardial effusion moderate without tamponade.  Seems better on more recent echo. ?Chronic AF, hx NSVT, AICD in place ?Ischemic colitis- with ABLA, was resolved,.  Had nonbloody BM 3/4. ?HLD, GERD, OSA ontolerant to BIPAP ?Hx breast ca 1998 with exposure to adriamycin ?Postop ileus- again an issue, resuming neostigmine ?Diabetes type 2 ? ?Plan: ?Continue CVVHD for volume management ?Continue vancomycin plus meropenem,  consult infectious disease ?I will let infectious disease decide on need for repeat cultures. ?Titrate vasopressors to maintain mean arterial pressure greater than 65 ?Continue hemodynamics and following them with current Swan placement. ?Lung protective ventilation ?Weaned off of Veletri ?Continue to wean PEEP as tolerated ?Goal SPO2 greater than 90% ? ? ?Best Practice (right click and "Reselect all SmartList Selections" daily)  ? ?Diet/type: tubefeeds ?DVT prophylaxis: SCD ?GI prophylaxis: PPI ?Lines: Central line and Arterial Line ?Foley:  Yes, and it is still needed ?Code Status:  full code ?Last date of multidisciplinary goals of care discussion [3/6] ? ?This patient is critically ill with multiple organ system failure; which, requires frequent high complexity decision making, assessment, support, evaluation, and titration of therapies. This was completed through the application of advanced monitoring technologies and extensive interpretation of multiple databases. During this encounter critical care time was devoted to patient care services described in this note for 35 minutes. ? ?Garner Nash, DO ?[facility] ?11/16/2021 9:20 AM   ? ? ? ? ?

## 2021-11-16 NOTE — Progress Notes (Signed)
Date and time results received: 11/16/21 1130 ? ? ?(use smartphrase ".now" to insert current time) ? ?Test: lactic ?Critical Value: 3.1 ? ?Name of Provider Notified: Linna Hoff Benhimon ? ?Orders Received? Or Actions Taken?: Stopped tube feeds for now. ? ? ?Donnetta Simpers, RN BSN ?

## 2021-11-16 NOTE — Progress Notes (Signed)
? ?   ?[address] ?      [place] [address] ?            [phone]   ?              ?12 Days Post-Op ?Procedure(s) (LRB): ?TRICUSPID VALVE REPAIR USING EDWARDS MC3 TRICUSPID ANNULOPLASTY RING SIZE 28MM (N/A) ?TRANSESOPHAGEAL ECHOCARDIOGRAM (TEE) (N/A) ? ? ?Events: ?No events ?_______________________________________________________________ ?Vitals: ?BP (!) 109/50   Pulse 80   Temp (!) 97.5 ?F (36.4 ?C)   Resp (!) 32   Ht '5\' 2"'$  (1.575 m)   Wt 88.4 kg   SpO2 95%   BMI 35.65 kg/m?  ?Filed Weights  ? 11/14/21 0232 11/15/21 0450 11/16/21 0412  ?Weight: 95.6 kg 92 kg 88.4 kg  ? ? ? ?- Neuro: sedated ? ?- Cardiovascular: paced ? Drips: epi 4, levo 17, vaso 0.04.   ?PAP: (39-58)/(23-36) 41/29 ?CVP:  [0 mmHg-27 mmHg] 13 mmHg ?CO:  [3.1 L/min-4.6 L/min] 3.1 L/min ?CI:  [1.6 L/min/m2-2.4 L/min/m2] 1.6 L/min/m2 ? ?- Pulm:  ?Vent Mode: PRVC ?FiO2 (%):  [40 %-65 %] 50 % ?Set Rate:  [32 bmp] 32 bmp ?Vt Set:  [400 mL] 400 mL ?PEEP:  [10 cmH20-16 cmH20] 10 cmH20 ?Plateau Pressure:  [33 cmH20-34 cmH20] 34 cmH20 ? ?ABG ?   ?Component Value Date/Time  ? PHART 7.261 (L) 11/13/2021 1019  ? PCO2ART 52.4 (H) 11/13/2021 1019  ? PO2ART 114 (H) 11/13/2021 1019  ? HCO3 23.7 11/13/2021 1019  ? TCO2 25 11/13/2021 1019  ? ACIDBASEDEF 4.0 (H) 11/13/2021 1019  ? O2SAT 36.3 11/16/2021 0756  ? ? ?- Abd: ND ?- Extremity: warm ? ?Marland KitchenIntake/Output   ?   03/11 0701 ?03/12 0700 03/12 0701 ?03/13 0700  ? I.V. (mL/kg) 1815.1 (20.5) 566.4 (6.4)  ? Other    ? NG/GT 1410 320  ? IV Piggyback 649.8   ? Total Intake(mL/kg) 3874.9 (43.8) 886.4 (10)  ? Emesis/NG output 70   ? Other 6436 741  ? Stool 400   ? Chest Tube 0   ? Total Output 6906 741  ? Net -3031.1 +145.4  ?     ?  ? ? ?_______________________________________________________________ ?Labs: ?CBC Latest Ref Rng & Units 11/16/2021 11/15/2021 11/14/2021  ?WBC 4.0 - 10.5 K/uL 31.5(H) 22.5(H) 16.8(H)  ?Hemoglobin 12.0 - 15.0 g/dL 9.1(L) 9.1(L) 8.4(L)  ?Hematocrit 36.0 - 46.0 % 31.7(L)  31.0(L) 28.9(L)  ?Platelets 150 - 400 K/uL 208 202 169  ? ?CMP Latest Ref Rng & Units 11/16/2021 11/15/2021 11/15/2021  ?Glucose 70 - 99 mg/dL 229(H) 156(H) 150(H)  ?BUN 8 - 23 mg/dL '20 19 19  '$ ?Creatinine 0.44 - 1.00 mg/dL 0.72 0.83 0.78  ?Sodium 135 - 145 mmol/L 130(L) 134(L) 134(L)  ?Potassium 3.5 - 5.1 mmol/L 5.0 4.9 4.9  ?Chloride 98 - 111 mmol/L 98 101 100  ?CO2 22 - 32 mmol/L 20(L) 24 22  ?Calcium 8.9 - 10.3 mg/dL 8.5(L) 8.6(L) 9.0  ?Total Protein 6.5 - 8.1 g/dL - - -  ?Total Bilirubin 0.3 - 1.2 mg/dL - - -  ?Alkaline Phos 38 - 126 U/L - - -  ?AST 15 - 41 U/L - - -  ?ALT 0 - 44 U/L - - -  ? ? ?CXR: ?Stable bilateral interstitial process ? ?_______________________________________________________________ ? ?Assessment and Plan: ?POD 12 s/p TVR ? ?Neuro: sedated while on vent ?CV: stable on chemical support ?Pulm: vent per PCCM.  Will remove chest tubes ?Renal: on CRRT ?GI: receiving tube feeds ?  Heme: stable ?ID: afebrile ?Endo: SSI ?Dispo: continue ICU care ? ? ?Lucile Crater Essica Kiker ?11/16/2021 11:46 AM ? ? ?

## 2021-11-17 ENCOUNTER — Inpatient Hospital Stay (HOSPITAL_COMMUNITY): Payer: Medicare PPO

## 2021-11-17 DIAGNOSIS — Z9911 Dependence on respirator [ventilator] status: Secondary | ICD-10-CM | POA: Diagnosis not present

## 2021-11-17 DIAGNOSIS — N179 Acute kidney failure, unspecified: Secondary | ICD-10-CM | POA: Diagnosis not present

## 2021-11-17 DIAGNOSIS — I3139 Other pericardial effusion (noninflammatory): Secondary | ICD-10-CM | POA: Diagnosis not present

## 2021-11-17 DIAGNOSIS — J9601 Acute respiratory failure with hypoxia: Secondary | ICD-10-CM

## 2021-11-17 DIAGNOSIS — T8144XA Sepsis following a procedure, initial encounter: Secondary | ICD-10-CM

## 2021-11-17 LAB — COOXEMETRY PANEL
Carboxyhemoglobin: 2 % — ABNORMAL HIGH (ref 0.5–1.5)
Carboxyhemoglobin: 1.7 % — ABNORMAL HIGH (ref 0.5–1.5)
Methemoglobin: 1.5 % (ref 0.0–1.5)
Methemoglobin: 1.2 % (ref 0.0–1.5)
O2 Saturation: 44.9 %
O2 Saturation: 50.3 %
Total hemoglobin: 8.4 g/dL — ABNORMAL LOW (ref 12.0–16.0)
Total hemoglobin: 7.3 g/dL — ABNORMAL LOW (ref 12.0–16.0)

## 2021-11-17 LAB — RENAL FUNCTION PANEL
Albumin: 2 g/dL — ABNORMAL LOW (ref 3.5–5.0)
Albumin: 1.6 g/dL — ABNORMAL LOW (ref 3.5–5.0)
Anion gap: 11 (ref 5–15)
Anion gap: 10 (ref 5–15)
BUN: 16 mg/dL (ref 8–23)
BUN: 18 mg/dL (ref 8–23)
CO2: 24 mmol/L (ref 22–32)
CO2: 27 mmol/L (ref 22–32)
Calcium: 8.5 mg/dL — ABNORMAL LOW (ref 8.9–10.3)
Calcium: 7.9 mg/dL — ABNORMAL LOW (ref 8.9–10.3)
Chloride: 100 mmol/L (ref 98–111)
Chloride: 96 mmol/L — ABNORMAL LOW (ref 98–111)
Creatinine, Ser: 0.7 mg/dL (ref 0.44–1.00)
Creatinine, Ser: 0.99 mg/dL (ref 0.44–1.00)
GFR, Estimated: >60 mL/min (ref >60)
GFR, Estimated: >60 mL/min (ref >60)
Glucose, Bld: 192 mg/dL — ABNORMAL HIGH (ref 70–99)
Glucose, Bld: 162 mg/dL — ABNORMAL HIGH (ref 70–99)
Phosphorus: 2.8 mg/dL (ref 2.5–4.6)
Phosphorus: 1.8 mg/dL — ABNORMAL LOW (ref 2.5–4.6)
Potassium: 4.7 mmol/L (ref 3.5–5.1)
Potassium: 4.4 mmol/L (ref 3.5–5.1)
Sodium: 135 mmol/L (ref 135–145)
Sodium: 133 mmol/L — ABNORMAL LOW (ref 135–145)

## 2021-11-17 LAB — GLUCOSE, CAPILLARY
Glucose-Capillary: 189 mg/dL — ABNORMAL HIGH (ref 70–99)
Glucose-Capillary: 183 mg/dL — ABNORMAL HIGH (ref 70–99)
Glucose-Capillary: 178 mg/dL — ABNORMAL HIGH (ref 70–99)
Glucose-Capillary: 151 mg/dL — ABNORMAL HIGH (ref 70–99)

## 2021-11-17 LAB — HEPATIC FUNCTION PANEL
ALT: 188 U/L — ABNORMAL HIGH (ref 0–44)
AST: 984 U/L — ABNORMAL HIGH (ref 15–41)
Albumin: 1.9 g/dL — ABNORMAL LOW (ref 3.5–5.0)
Alkaline Phosphatase: 304 U/L — ABNORMAL HIGH (ref 38–126)
Bilirubin, Direct: 1.6 mg/dL — ABNORMAL HIGH (ref 0.0–0.2)
Indirect Bilirubin: 1 mg/dL — ABNORMAL HIGH (ref 0.3–0.9)
Total Bilirubin: 2.6 mg/dL — ABNORMAL HIGH (ref 0.3–1.2)
Total Protein: 5.3 g/dL — ABNORMAL LOW (ref 6.5–8.1)

## 2021-11-17 LAB — CULTURE, RESPIRATORY W GRAM STAIN

## 2021-11-17 LAB — HEPARIN LEVEL (UNFRACTIONATED): Heparin Unfractionated: 0.1 IU/mL — ABNORMAL LOW (ref 0.30–0.70)

## 2021-11-17 LAB — APTT: aPTT: 55 seconds — ABNORMAL HIGH (ref 24–36)

## 2021-11-17 LAB — CBC
HCT: 30.2 % — ABNORMAL LOW (ref 36.0–46.0)
Hemoglobin: 8.6 g/dL — ABNORMAL LOW (ref 12.0–15.0)
MCH: 29.3 pg (ref 26.0–34.0)
MCHC: 28.5 g/dL — ABNORMAL LOW (ref 30.0–36.0)
MCV: 102.7 fL — ABNORMAL HIGH (ref 80.0–100.0)
Platelets: 215 10*3/uL (ref 150–400)
RBC: 2.94 MIL/uL — ABNORMAL LOW (ref 3.87–5.11)
RDW: 23.4 % — ABNORMAL HIGH (ref 11.5–15.5)
WBC: 45.5 10*3/uL — ABNORMAL HIGH (ref 4.0–10.5)
nRBC: 31.4 % — ABNORMAL HIGH (ref 0.0–0.2)

## 2021-11-17 LAB — LACTIC ACID, PLASMA: Lactic Acid, Venous: 3.2 mmol/L (ref 0.5–1.9)

## 2021-11-17 LAB — MAGNESIUM: Magnesium: 2.5 mg/dL — ABNORMAL HIGH (ref 1.7–2.4)

## 2021-11-17 MED ORDER — IOHEXOL 9 MG/ML PO SOLN
500.0000 mL | ORAL | Status: AC
  Administered 2021-11-17 (×2): 500 mL via ORAL

## 2021-11-17 MED ORDER — IOHEXOL 9 MG/ML PO SOLN
ORAL | Status: AC
  Filled 2021-11-17: qty 1000

## 2021-11-17 MED ORDER — IOHEXOL 300 MG/ML  SOLN
100.0000 mL | Freq: Once | INTRAMUSCULAR | Status: AC | PRN
  Administered 2021-11-17: 100 mL via INTRAVENOUS

## 2021-11-21 LAB — CULTURE, BLOOD (ROUTINE X 2)
Culture: NO GROWTH
Culture: NO GROWTH
Special Requests: ADEQUATE

## 2021-12-06 NOTE — Progress Notes (Signed)
? ?NAME:  [name], MRN:  [id number], DOB:  02-05-1956, LOS: 21 ?ADMISSION DATE:  10/20/2021, CONSULTATION DATE:  11/06/21 ?REFERRING MD:  Nils Pyle, CHIEF COMPLAINT:  hypoxia  ? ?History of Present Illness:  ?[name] is a 66 year old woman with a history of HFrEF and paroxysmal atrial fibrillation who was transferred to [facility] with GI bleed and pericardial effusion.  She had recently undergone watchman insertion in January 2023.  She underwent EGD which did not explain bleeding.  Colonoscopy demonstrated sessile polyps, which were removed in the ascending colon and areas of ischemic colonic ulcers.  There was moderate sigmoid diverticulosis.  Heart catheterization demonstrated normal coronary arteries with equalization of left and right-sided heart pressures, severely reduced PAPi due to severe tricuspid regurgitation.  Swan-Ganz catheter was left in place to assist with weaning inotropic support.  Echocardiograms have previously demonstrated reduced RV function and dilation. On 2/28 she underwent Tricuspid repair and ring annuplasty. Post-op day 1 she was extubated but developed rapidly worsening respiratory failure overnight on 3/2 requiring re-intubation by anesthesia. CXR post-intubation had bilateral infiltrates. ? 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 ?Significant Hospital Events: ?Including procedures, antibiotic start and stop dates in addition to other pertinent events   ? ?2/20 admitted for pericardial effusion without tamponade ?2/22 L&R heart cath ?2/24 EGD & colonoscopy ?2/28 tricuspid valve repair; Vt arrest coming off bypass, back on bypass to stabilize ?3/1 extubated post op, reintubated overnight for hypoxia ?3/2 PCCM consulted for vent management.  Started on vancomycin and Zosyn for presumed aspiration pneumonia ?3/3 remains in a mixed picture of cardiogenic  and septic shock.  ?3/4 started on CRRT ?3/5>> improving pressor needs, stubborn O2 needs ?3/12 rising white blood cell count, fevers, infectious disease consultation ? ?Interim History / Subjective:  ?Rising lactic acid overnight. Remains on epi, NE, vasopressin. ? ?Objective   ?Blood pressure (!) 85/31, pulse 79, temperature 98.6 ?F (37 ?C), resp. rate (!) 33, height '5\' 2"'$  (1.575 m), weight 89.8 kg, SpO2 91 %. ?PAP: (41-55)/(20-29) 49/26 ?CVP:  [8 mmHg-19 mmHg] 12 mmHg ?CO:  [3.1 L/min-4.1 L/min] 3.9 L/min ?CI:  [1.6 L/min/m2-2.1 L/min/m2] 2 L/min/m2  ?Vent Mode: PRVC ?FiO2 (%):  [50 %-60 %] 55 % ?Set Rate:  [32 bmp] 32 bmp ?Vt Set:  [400 mL] 400 mL ?PEEP:  [8 cmH20-12 cmH20] 8 cmH20 ?Plateau Pressure:  [22 cmH20] 22 cmH20  ? ?Intake/Output Summary (Last 24 hours) at December 13, 2021 0840 ?Last data filed at 12-13-21 0700 ?Gross per 24 hour  ?Intake 4840.17 ml  ?Output 4270 ml  ?Net 570.17 ml  ? ? ?Filed Weights  ? 11/15/21 0450 11/16/21 0412 2021/12/13 0307  ?Weight: 92 kg 88.4 kg 89.8 kg  ? ? ?Examination: ?General: elderly woman lying in bed intubated, sedated ?HEENT:  [place]/AT, eyes anicteric ?Neck: RIJ Swan ?Extremities: anasarca, no cyanosis ?Heart: S1S2, RRR ?Lungs: synchronous with MV, CTAB ?Abd: soft, NT ?Derm: pallor, no diffuse rashes ? ?Coox 45% ?WBC 45.5 ?H/H 8.6/30.2 ?LA  3.1> 3.9> 5.1 ?Blood cultures> NGTD ?Resp culture> few WBC, rare yeast ? ?Resolved Hospital Problem list   ? ? ?Assessment & Plan:  ? ?Acute hypoxemic respiratory failure- combination of aspiration, ARDS, CAP, pulmonary edema, shunting from septal defect post- Watchman procedure ?-LTVV, 4-8cc/kg IBW with goal Pplat<30 and DP<15 ?-VAP prevention protocol ?-PAD protocol for sedation ?-daily SAT & SBT when appropriate; not stable enough currently ?-needs volume  off with RRT ?-con't antibiotics ? ?Worsening sepsis- concern for mesenteric ischemia from prolonged low-flow state. Cultures negative so far.  ?-con't broad antibiotics; appreciate ID's  recommendations ?-con't vasopressors to maintain MAP >65 ?-repeat CXR today ?-CT abdomen today ? ?Acute on chronic HFrEF, cardiogenic shock, volume overloaded ?LBBB +/- adriamycin-induced NICM ?Progressive R heart failure, severe TR, s/p TVR ?Pericardial effusion due to RHF, improved on most recent echo ?Chronic Afib ?TVR complicated by VT arrest coming off pump, brief. ?-con't Swan for hemodynamic monitoring; may need to change line sites today ?-con't inotropic support ? ?Renal failure with oliguria- septic ATN and cardiorenal now on CRRT ?-volume removal with RRT ?-renally dose meds, avoid nephrotoxic meds ?-strict I/Os ?-CRRT per nephrology ? ?Lactic acidosis ?-maintain MAP >65 ?-con't antibiotics ? ?Acute GIB due to ischemic colitis, worry about recurrent mesenteric ischemia ?GERD ?Post-op ileus, previously on neostigmine ?-con't to maintain adequate perfusion ?-PPI ? ?Transaminase elevation, hyperbilirubinemia> worry about congestion vs low flow from heart failure causing ischemia ?-RUQ [place] pending ?-CT reassuring; GB surgically absent ? ?HLD ?-con't statin ? ?OSA ontolerant to BIPAP ?-Nocturnal CPAP when off vent ? ?Hx breast ca 1998 with exposure to adriamycin ? ?Diabetes type 2,  ?-con't levemir 10 units BID ?-SSI PRN ?-goal BG 140-180 ? ?GoC discussion-- per Cardiology today, DNR is appropriate. ? ? ?Best Practice (right click and "Reselect all SmartList Selections" daily)  ? ?Diet/type: NPO ?DVT prophylaxis: SCD ?GI prophylaxis: PPI ?Lines: Central line and Arterial Line ?Foley:  Yes, and it is still needed ?Code Status:  full code ?Last date of multidisciplinary goals of care discussion [3/6] ? ?This patient is critically ill with multiple organ system failure; which, requires frequent high complexity decision making, assessment, support, evaluation, and titration of therapies. This was completed through the application of advanced monitoring technologies and extensive interpretation of multiple  databases. During this encounter critical care time was devoted to patient care services described in this note for 45 minutes. ? ?Julian Hy, DO ?[facility] ?02-Dec-2021 8:40 AM   ? ? ? ? ?

## 2021-12-06 NOTE — Progress Notes (Signed)
Nutrition Follow-up ? ?DOCUMENTATION CODES:  ? ?Not applicable ? ?INTERVENTION:  ? ?TF on hold pending results of CT abdomen ? ?Tube Feeding Recommendations when able:  ?Vital 1.5 at 50 ml/hr ?Pro-Source TF 45 mL TID ?Provides 1920 kcals, 114 g of protein, 912 mL of free water ? ?B complex with C, Folic Acid to replace losses while on CRRT ? ?NUTRITION DIAGNOSIS:  ? ?Inadequate oral intake related to acute illness as evidenced by NPO status. ? ?Being addressed via TF  ? ?GOAL:  ? ?Patient will meet greater than or equal to 90% of their needs ? ?Progressing ? ?MONITOR:  ? ?Vent status, Labs, Weight trends, TF tolerance ? ?REASON FOR ASSESSMENT:  ? ?Ventilator, Consult ?Enteral/tube feeding initiation and management ? ?ASSESSMENT:  ? ?66 yo female admitted with pericardial effusion, GI bleed.Pt with severe tricuspid regurgitation s/p TVR, cardiogenic shock, acute respiratory failure with ARDS secondary requiring intubation.  PMH includes CKD III/IV, CHF, HLD, GERD ? ?2/20 Transferred to [facility] for GI bleed, pericardial effusion ?2/24 EGD, Colonoscopy: right colon ischemic ulcers, polyps ?2/28 s/p TVR ?3/01 Extubated ?3/02 Re-Intubated ?3/04 CRRT initiated, severe abdominal distention on abd xray ? ?Pt remains on full vent support-100% FiO2, WBC trending up, lactic acid 5.1 ?Noted concern for ischemic bowel ? ?Abd xray this AM consistent with ileus vs obstruction. CT abdomen pending ? ?Remains on CRRT ? ?TF on hold ? ?Labs: CBG [id number] ?Meds: B complex with C, dulcolax, ss novolog, levemir ? ?Diet Order:   ?Diet Order   ? ? None  ? ?  ? ? ?EDUCATION NEEDS:  ? ?Not appropriate for education at this time ? ?Skin:  Skin Assessment: Reviewed RN Assessment ? ?Last BM:  3/9 ? ?Height:  ? ?Ht Readings from Last 1 Encounters:  ?11-26-21 '5\' 2"'$  (1.575 m)  ? ? ?Weight:  ? ?Wt Readings from Last 1 Encounters:  ?11-26-2021 89.8 kg  ? ? ?Ideal Body Weight:    ? ?BMI:  Body mass index is 36.21 kg/m?. ? ?Estimated Nutritional Needs:   ? ?Kcal:  1750-2000 kcals ? ?Protein:  100-120 g ? ?Fluid:  1.5 L ? ?Kerman Passey MS, RDN, LDN, CNSC ?Registered Dietitian III ?Clinical Nutrition ?RD Pager and On-Call Pager Number Located in [place]  ? ?

## 2021-12-06 NOTE — Progress Notes (Addendum)
Advanced Heart Failure Rounding Note  PCP-Cardiologist: None   Subjective:   02/20: Transferred to [facility] for GI bleed + pericardial effusion 02/22: R/LHC with normal cors, equalized R and L sided pressures with no evidence of LV-RV interaction, RA 17, PA 33/21 (25), Fick CO/CI 4.2/2.2, severely reduced PAPi (in setting of severe TR), PA sat 47%, 48% 2/24: S/P Two polyps in the distal ascending colon, removed with a cold snare. Resected and retrieved. Right colonic ischemic ulcers (healing, no active bleeding, biopsied)-likely etiology of LGI. Moderate sigmoid diverticulosis. 2/25: Diuresed with IV lasix.  2/26: Switched to torsemide 60 /60 .  2/28: S/P TVR . Off pump had VT/VT when PA catheter placed. Had cardiac massage. Cardioverted x2. Unsuccessful. Placed back on pump with restoration of SR. Started on IV amiodarone.  3/1: Extubated 3/2: Reintubated for acute hypoxic respiratory failure. Stat CXR ? Possible b/l PNA. Concern for developing septic shock, WBC 39K. PCT 2.49. Increased pressor requirements w/ NE and VP. Milrinone discontinued w/ low SVR. Several rounds of bicarb for persistent metabolic acidosis. Started on vanc + zosyn. 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 LVEF 45-50%, D-shaped interventricular septum, RV moderately reduced, trivial TR   03/09: s/p 1 u RBCs for hgb 7.1  Remains intubated and sedated. On CVVHD (pulling net even) and 7 Epi, 37 NE, 0.05 Vaso. Became hypotensive shortly after added DBA yesterday, discontinued.  Afebrile this am on meropenem and vanc. WBC rising 16 -> 22 -> 31 -> 46. Pressor requirements continue to increase. Co-ox consistently low, 45% this am. CVP 12.  Swan #s CVP 12 PA 47/21 (30) PCWP 14 Thermo 3.93/2.02 SVR 1005   RN reports copious GI secretions suctioned, concern for aspiration  events.  Objective:   Weight Range: 89.8 kg Body mass index is 36.21 kg/m.   Vital Signs:   Temp:  [97.2 F (36.2 C)-99.3 F (37.4 C)] 98.6 F (37 C) (03/13 0745) Pulse Rate:  [78-82] 79 (03/13 0745) Resp:  [18-35] 33 (03/13 0745) BP: (83-109)/(23-50) 85/31 (03/13 0400) SpO2:  [87 %-99 %] 91 % (03/13 0745) Arterial Line BP: (90-109)/(40-49) 104/43 (03/13 0745) FiO2 (%):  [50 %-60 %] 55 % (03/13 0600) Weight:  [89.8 kg] 89.8 kg (03/13 0307) Last BM Date : 11/15/21  Weight change: Filed Weights   11/15/21 0450 11/16/21 0412 11-23-21 0307  Weight: 92 kg 88.4 kg 89.8 kg    Intake/Output:   Intake/Output Summary (Last 24 hours) at 2021-11-23 0806 Last data filed at November 23, 2021 0700 Gross per 24 hour  Intake 4840.17 ml  Output 4270 ml  Net 570.17 ml      Physical Exam   General:  Critically ill appearing on vent. HEENT: + ETT, cor-trak Neck: supple. no JVD. Carotids 2+ bilat; no bruits.  Cor: PMI nondisplaced. Regular rate & rhythm. No rubs, gallops or murmurs. Lungs: coarse anteriorly Abdomen: soft, nontender, + distended. Hypoactive bowel sounds Extremities: no cyanosis, clubbing, rash, 1-2 + edema, ecchymoses on  extremities Neuro: sedated on vent     Telemetry   AV paced 80 (personally reviewed)   Labs    CBC Recent Labs    11/16/21 0330 Nov 23, 2021 0310  WBC 31.5* 45.5*  NEUTROABS 29.3*  --   HGB 9.1* 8.6*  HCT 31.7* 30.2*  MCV 101.0* 102.7*  PLT 208 349   Basic Metabolic Panel Recent Labs    11/16/21 0330 11/16/21  1612 12-14-2021 0310  NA 130* 136 135  K 5.0 4.9 4.7  CL 98 103 100  CO2 20* 22 24  GLUCOSE 229* 180* 192*  BUN _0 CREATININE 0.72 0.77 0.70  CALCIUM 8.5* 8.6* 8.5*  MG 2.6*  --  2.5*  PHOS 2.3* 2.6 2.8   Liver Function Tests Recent Labs    11/16/21 1612 14-Dec-2021 0310  ALBUMIN 2.1* 2.0*   No results for input(s): LIPASE, AMYLASE in the last 72 hours. Cardiac Enzymes No results for input(s): CKTOTAL, CKMB,  CKMBINDEX, TROPONINI in the last 72 hours.  BNP: BNP (last 3 results) Recent Labs    02/24/21 1357 07/16/21 1145 10/19/2021 2138  BNP 326.4* 523.7* 477.9*    ProBNP (last 3 results) No results for input(s): PROBNP in the last 8760 hours.   D-Dimer No results for input(s): DDIMER in the last 72 hours. Hemoglobin A1C No results for input(s): HGBA1C in the last 72 hours.  Fasting Lipid Panel No results for input(s): CHOL, HDL, LDLCALC, TRIG, CHOLHDL, LDLDIRECT in the last 72 hours.   Thyroid Function Tests No results for input(s): TSH, T4TOTAL, T3FREE, THYROIDAB in the last 72 hours.  Invalid input(s): FREET3   Other results:   Imaging    No results found.   Medications:     Scheduled Medications:  acetaminophen (TYLENOL) oral liquid 160 mg/5 mL  650 mg Per Tube Q6H   artificial tears  1 application. Both Eyes Q8H   aspirin EC  325 mg Oral Daily   Or   aspirin  324 mg Per Tube Daily   atorvastatin  10 mg Per Tube q AM   B-complex with vitamin C  1 tablet Per Tube Daily   bisacodyl  10 mg Oral Daily   Or   bisacodyl  10 mg Rectal Daily   buPROPion  75 mg Per Tube BID   chlorhexidine gluconate (MEDLINE KIT)  15 mL Mouth Rinse BID   Chlorhexidine Gluconate Cloth  6 each Topical Daily   docusate  100 mg Per Tube BID   feeding supplement (PROSource TF)  45 mL Per Tube TID   folic acid  1 mg Per Tube Daily   insulin aspart  0-24 Units Subcutaneous Q4H   insulin detemir  10 Units Subcutaneous BID   mouth rinse  15 mL Mouth Rinse 10 times per day   pantoprazole sodium  40 mg Per Tube Daily   polyethylene glycol  17 g Per Tube Daily   sennosides  10 mL Per Tube BID   sodium chloride flush  10-40 mL Intracatheter Q12H   sodium chloride flush  3 mL Intravenous Q12H   venlafaxine  75 mg Per Tube BID WC    Infusions:   prismasol BGK 4/2.5 800 mL/hr at Dec 14, 2021 0240    prismasol BGK 4/2.5 400 mL/hr at 12/14/2021 0209   sodium chloride 20 mL/hr at 11/07/21 1800    sodium chloride     sodium chloride 5 mL/hr at 11/10/21 0800   sodium chloride     sodium chloride Stopped (11/12/21 1932)   amiodarone 30 mg/hr (12-14-2021 0700)   DOBUTamine Stopped (11/16/21 1044)   epinephrine 7 mcg/min (12-14-21 0700)   feeding supplement (VITAL 1.5 CAL) Stopped (11/16/21 1200)   fentaNYL infusion INTRAVENOUS 100 mcg/hr (2021-12-14 0700)   heparin 10,000 units/ 20 mL infusion syringe 500 Units/hr (2021-12-14 0700)   lactated ringers 10 mL/hr at 12-14-2021 0700   meropenem (MERREM) IV Stopped (Dec 14, 2021 0539)  midazolam 4 mg/hr (2021-11-18 0700)   norepinephrine (LEVOPHED) Adult infusion 37 mcg/min (Nov 18, 2021 0754)   prismasol BGK 4/2.5 1,500 mL/hr at Nov 18, 2021 0756   sodium bicarbonate 150 mEq in D5W infusion 125 mL/hr at 11-18-21 0604   vancomycin Stopped (11/16/21 1726)   vasopressin 0.05 Units/min (November 18, 2021 0710)     PRN Medications: sodium chloride, Place/Maintain arterial line **AND** sodium chloride, sodium chloride, fentaNYL, heparin, lip balm, midazolam, ondansetron (ZOFRAN) IV, rocuronium bromide, sodium chloride, sodium chloride flush, sodium chloride flush     Patient Profile    66 y.o. female with h/o PAF with recent failed ablation S/p Watchman implant 08/21/21,  breast CA in 1998 s/p left breast surgery and s/p Adriamycin x 4 cycles, non-ischemic cardiomyopathy/chronic systolic CHF, LBBB, s/p BiV Medtronic ICD, HTN, OSA, and CKD Stage IIIb. Transferred from OSH for acute GIB and pericardial effusion. Initial Echo here showed EF 35-40% RV severely enlarged w/ low normal systolic fx. Moderate posterior effusion, no tamponade. Severe TR noted. R/LCH showed normal cors, LVEF 40%, Equalized right and left-sided pressure with no evidence of LV-RV interaction and evidence of severe RV failure w/ severely reduced PAPi, 0.6, in setting of severe TR. Went to OR 2/28 for TVR. Off pump had VT/VF when PA catheter placed. Had cardiac massage. Cardioverted x2. Unsuccessful.  Placed back on pump with restoration of SR. Started on IV amiodarone.   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 Acute on Chronic Systolic CHF w/ Post Cardiotomy Shock  - felt to have LBBB CM vs chemo induced (got adriamycin). 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 No effusion -Echo 02/21: EF 35-40%, RV severely enlarged with okay function, moderate MR, severe TR with flail posterior leaflet -RHC 02/22: RA 17, PA 33/21 (25), PCWP 18, PAPi 0.6 (in setting of severe TR), PA sat 47%, 48% -S/p TVR 2/28. Intraop TEE EF 40-45%, RV mildly reduced.  VT arrest when coming off pump  - Continues with mixed septic/cardiogenic shock post-operatively - Repeat limited echo 3/6 EF 45-50%, RV mod reduced, R>>L atrial level shunt  - Remains on triple pressors/intotropes with increasing requirements, now 7 Epi, 37 NE, 0.5 Vaso. - Now with rising WBC (up to 45.5 K) and fevers (AF this am). On vanc/meropenem. CCM has consulted ID. Recommending continuing current abx. ? Component of aspiration PNA +/- line infection. Dr. Haroldine Laws discussed with Dr. Darcey Nora, will pull swan and replace central line. - Also growing concern for ischemic bowel. Check CT abdomen and pelvis. Lactic acid 3.1>3.9>5.1. Continue to trend this am. On bicarb gtt. - Will keep even on CVVHD - Prognosis increasingly grim with MSOF. Now limited code (no CPR)   3. Acute Hypoxic Respiratory Failure  - Extubated 3/1 - Reintubated 3/2. CXR with PNA -> felt to be aspiration - PCT 2.49>>3.66>>4.15 - Covered w/ empiric abx, Vanc + zosyn  - Cultures NGTD - CXR 03/11 persistent bilateral infiltrates. PNA +/- edema - ? Aspiration PNA - Vanc  and meropenum stated. ID consulted. BC X 2 NG < 24 hrs. - Appreciate CCM management  4. PAF/tachycardia - Unable to complete ablation 10/22 d/t pericardial effusion - S/p Watchman implant 08/21/21 - continue amio gtt 30  mg/hr while on inotropes/ pressors  Remains AV paced  - heparin through CVVHD circuit   5. AKI on CKD IIIb/IV/ ATN  - Suspect ATN.  - Remains on CVVHD. Weight down to pre-op weight.  - Keep CVVHD even today - Nephrology following    6. GI bleeding - BRBPR + melena 4-5 days prior to admission -EGD/Colonoscopy- R colonic ischemic ulcers - suspect in the setting of low output hf., 2 polyps removed, diverticulosis. Polyps no malignancy   - On PPI  - Transfuse < 8.  - 1 u RBCs 03/09 - Hgb 8.6 today  7. Hypokalemia/ hypomagnesia - K /Mag stable.   8. VT/VF -Intraoperative. Placed on amio drip.  -stable, no recurrence  -continue amio gtt -keep K >4.0 and Mg >2.0  9. 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 Limited code - no CPR. Everything else ok   Remains critically ill. Prognosis worsening with persistent shock and MSOF. Worry about ischemic bowel. We may be reaching the limits of what we can do for her.    Length of Stay: 21  [facility], [place], PA-C  29-Nov-2021, 8:06 AM  Advanced Heart Failure Team Pager [phone] (M-F; 7a - 5p)  Please contact [facility] for night-coverage after hours (5p -7a ) and weekends on [email or web address]  Agree with above.  Remains intubated and sedated. Has had increasing WBC and pressor requirements. Co-ox low On broad spectrum abx. Keeping even on CRRT. NGT placed with high residuals   General:  Ill appearing. Sedated on vent  HEENT: normal + ETT Neck: supple. RIJ swan   Cor: PMI nondisplaced. Regular rate & rhythm. No rubs, gallops or murmurs. Lungs: coarse Abdomen: + distended. No bowel sounds. Extremities: no  cyanosis, clubbing, rash, 1-2+ edema + ecchymoses Neuro: intubated sedated  Trajectory very concerning. Suspect she has ischemic bowel. Will get stat ab CT. Continue pressors and CRRT for now. D/W Drs. Prescott Gum and [place]. Will switch out swan for central line.   CRITICAL CARE Performed by: Glori Bickers  Total critical care time: 45 minutes  Critical care time was exclusive of separately billable procedures and treating other patients.  Critical care was necessary to treat or prevent imminent or life-threatening deterioration.  Critical care was time spent personally by me (independent of midlevel providers or residents) on the following activities: development of treatment plan with patient and/or surrogate as well as nursing, discussions with consultants, evaluation of patient's response to treatment, examination of patient, obtaining history from patient or surrogate, ordering and performing treatments and interventions, ordering and review of laboratory studies, ordering and review of radiographic studies, pulse oximetry and re-evaluation of patient's condition.   Glori Bickers, MD

## 2021-12-06 NOTE — Progress Notes (Signed)
Patient transported to and from CT scan on full support with 100% FIO2.  Tolerated well. ?

## 2021-12-06 NOTE — Progress Notes (Signed)
?[facility] ?Progress Note  ? ?Subjective:  Remains critically ill on mutliple vasopressors. Febrile, recurrent sepsis, lactate this AM 3.2.  Had new TLC placed today due to concern for sepsis. CT A/P basilar PNA, concern for ischemic bowel of small intestine.  I/Os yesterday 4.9 / 4.4, net neg 5.4 for admission.  Husband bedside.  ? ?Objective ?Vitals:  ? 2021-12-13 0715 13-Dec-2021 0730 12-13-2021 0745 December 13, 2021 0806  ?BP:    (!) 89/31  ?Pulse: 79 80 79 82  ?Resp: (!) 34 (!) 30 (!) 33 (!) 34  ?Temp: 98.6 ?F (37 ?C) 98.6 ?F (37 ?C) 98.6 ?F (37 ?C)   ?TempSrc:      ?SpO2: 93% 93% 91% 90%  ?Weight:      ?Height:      ? ?Physical Exam ?General: intubated and sedated ?Heart: RRR ?Lungs: vent, FiO2 55%, sat 90% ?Abdomen: soft, protuberant, dec BS. ?Extremities: trace diffuse edema ? ?Additional Objective ?Labs: ?Basic Metabolic Panel: ?Recent Labs  ?Lab 11/16/21 ?0330 11/16/21 ?1612 12-13-21 ?0310  ?NA 130* 136 135  ?K 5.0 4.9 4.7  ?CL 98 103 100  ?CO2 20* 22 24  ?GLUCOSE 229* 180* 192*  ?BUN 20 18 16   ?CREATININE 0.72 0.77 0.70  ?CALCIUM 8.5* 8.6* 8.5*  ?PHOS 2.3* 2.6 2.8  ? ?Liver Function Tests: ?Recent Labs  ?Lab 11/11/21 ?0312 11/11/21 ?1554 11/16/21 ?0330 11/16/21 ?1612 December 13, 2021 ?0310  ?AST 61*  --   --   --   --   ?ALT 12  --   --   --   --   ?ALKPHOS 429*  --   --   --   --   ?BILITOT 1.2  --   --   --   --   ?PROT 5.7*  --   --   --   --   ?ALBUMIN 1.7*   < > 2.2* 2.1* 2.0*  ? < > = values in this interval not displayed.  ? ?No results for input(s): LIPASE, AMYLASE in the last 168 hours. ?CBC: ?Recent Labs  ?Lab 11/11/21 ?6283 11/11/21 ?0403 11/13/21 ?1517 11/13/21 ?6160 11/14/21 ?0231 11/15/21 ?7371 11/16/21 ?0330 12-13-2021 ?0310  ?WBC 20.7*   < > 14.5*  --  16.8* 22.5* 31.5* 45.5*  ?NEUTROABS 17.2*  --   --   --  14.8*  --  29.3*  --   ?HGB 9.0*   < > 7.2*   < > 8.4* 9.1* 9.1* 8.6*  ?HCT 29.6*   < > 25.9*   < > 28.9* 31.0* 31.7* 30.2*  ?MCV 91.6   < > 100.0  --  96.7 98.1 101.0* 102.7*  ?PLT 161   < >  161  --  169 202 208 215  ? < > = values in this interval not displayed.  ? ?Blood Culture ?   ?Component Value Date/Time  ? SDES BLOOD LEFT ANTECUBITAL 11/16/2021 0843  ? SPECREQUEST  11/16/2021 0843  ?  BOTTLES DRAWN AEROBIC AND ANAEROBIC Blood Culture adequate volume  ? CULT  11/16/2021 0843  ?  NO GROWTH < 24 HOURS ?Performed at [facility], [place] [address] [place], [place] [address] ?  ? REPTSTATUS PENDING 11/16/2021 0843  ? ? ?Cardiac Enzymes: ?No results for input(s): CKTOTAL, CKMB, CKMBINDEX, TROPONINI in the last 168 hours. ?CBG: ?Recent Labs  ?Lab 11/16/21 ?1609 11/16/21 ?1923 11/16/21 ?2313 12-13-2021 ?4854 12/13/21 ?0734  ?GLUCAP 184* 145* 185* 189* 183*  ? ?Iron Studies: No results for input(s): IRON, TIBC, TRANSFERRIN,  FERRITIN in the last 72 hours. ?@lablastinr3 @ ?Studies/Results: ?No results found. ?Medications: ?  prismasol BGK 4/2.5 800 mL/hr at 11/20/2021 0835  ?  prismasol BGK 4/2.5 400 mL/hr at 11/20/2021 0209  ? sodium chloride 20 mL/hr at 11/07/21 1800  ? sodium chloride    ? sodium chloride 5 mL/hr at 11/10/21 0800  ? sodium chloride    ? sodium chloride Stopped (11/12/21 1932)  ? amiodarone 30 mg/hr (11-20-2021 0700)  ? DOBUTamine Stopped (11/16/21 1044)  ? epinephrine 7 mcg/min (11/20/2021 0700)  ? feeding supplement (VITAL 1.5 CAL) Stopped (11/16/21 1200)  ? fentaNYL infusion INTRAVENOUS 100 mcg/hr (11/20/21 0700)  ? heparin 10,000 units/ 20 mL infusion syringe 500 Units/hr (11/20/21 0700)  ? lactated ringers 10 mL/hr at 11-20-2021 0700  ? meropenem (MERREM) IV Stopped (Nov 20, 2021 0539)  ? midazolam 4 mg/hr (11/20/2021 0700)  ? norepinephrine (LEVOPHED) Adult infusion 37 mcg/min (2021-11-20 0754)  ? prismasol BGK 4/2.5 1,500 mL/hr at 11-20-21 0756  ? sodium bicarbonate 150 mEq in D5W infusion 125 mL/hr at 11-20-21 0604  ? vancomycin Stopped (11/16/21 1726)  ? vasopressin 0.05 Units/min (11-20-21 0710)  ? ? acetaminophen (TYLENOL) oral liquid 160 mg/5 mL  650 mg Per Tube Q6H  ? artificial tears  1  application. Both Eyes Q8H  ? aspirin EC  325 mg Oral Daily  ? Or  ? aspirin  324 mg Per Tube Daily  ? atorvastatin  10 mg Per Tube q AM  ? B-complex with vitamin C  1 tablet Per Tube Daily  ? bisacodyl  10 mg Oral Daily  ? Or  ? bisacodyl  10 mg Rectal Daily  ? buPROPion  75 mg Per Tube BID  ? chlorhexidine gluconate (MEDLINE KIT)  15 mL Mouth Rinse BID  ? Chlorhexidine Gluconate Cloth  6 each Topical Daily  ? docusate  100 mg Per Tube BID  ? feeding supplement (PROSource TF)  45 mL Per Tube TID  ? folic acid  1 mg Per Tube Daily  ? insulin aspart  0-24 Units Subcutaneous Q4H  ? insulin detemir  10 Units Subcutaneous BID  ? mouth rinse  15 mL Mouth Rinse 10 times per day  ? pantoprazole sodium  40 mg Per Tube Daily  ? polyethylene glycol  17 g Per Tube Daily  ? sennosides  10 mL Per Tube BID  ? sodium chloride flush  10-40 mL Intracatheter Q12H  ? sodium chloride flush  3 mL Intravenous Q12H  ? venlafaxine  75 mg Per Tube BID WC  ? 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 ?**severe AKI: multifactorial in etiology. on CRRT since 3/5 - cont with current settings today.  Lytes and volume are acceptable.  Resume now that she's back from CT.   ? ?**Shock:  multifactorial, cardiogenic and septic; remains pressor dependent.   CT concerning for intestinal ischemia now.  ID is following - currently on broad spectrum abx, lines changed.  ? ?**VDRF:  multifactorial in origin, on vent; pulm following. Today's CXR does not show worsening pulm edema.  ? ?**DM: insulin per primary ? ?Certainly tenuous, will continue to follow.  Call with any concerns. ? ?Jannifer Hick MD ?11-20-21, 9:21 AM  ?[facility] ?Pager: [phone] ? ? ?

## 2021-12-06 NOTE — Progress Notes (Signed)
?  Patient remains on full support with increasing pressor requirements. ? ?Discussed results of CT and trajectory with family.  ? ?Very little chance of meaningful survival. ? ?Decision made for transition to comfort care. D/w Dr. Prescott Gum at bedside. ? ?CRRT blood given back. Bolused with fentanyl and versed to ensure comfort. Pressors stopped.  ? ?I personally extubated and deactivated biv pacer.  ? ?Patient passed within minutes.  ? ?Pronounced at [employer] with family at bedside. ? ?CCT ~ 35 minutes. ? ?Glori Bickers, MD  ?7:06 PM ? ?

## 2021-12-06 NOTE — Procedures (Signed)
TRIPLE LUMEN  left subclavian line placed with sterile prep, drape after informed consent from husband ?Seldinger technique, local 1% lidocaine anesthesia ?Proper timeout completed ?Single stick ?CXR pending ? ?Ivin Poot MD ?

## 2021-12-06 NOTE — Progress Notes (Signed)
13 Days Post-Op Procedure(s) (LRB): ?TRICUSPID VALVE REPAIR USING EDWARDS MC3 TRICUSPID ANNULOPLASTY RING SIZE 28MM (N/A) ?TRANSESOPHAGEAL ECHOCARDIOGRAM (TEE) (N/A) ?Subjective: ?Continues to deteriorate with septic picture, WEBC > 40k ?NG placed with 300 cc bilious fluid out ?[facility] for > week, will plan to place new line for exchange ? ?Objective: ?Vital signs in last 24 hours: ?Temp:  [97.2 ?F (36.2 ?C)-99.3 ?F (37.4 ?C)] 98.6 ?F (37 ?C) (03/13 0745) ?Pulse Rate:  [78-82] 79 (03/13 0745) ?Cardiac Rhythm: A-V Sequential paced (03/13 0600) ?Resp:  [18-35] 33 (03/13 0745) ?BP: (83-109)/(23-50) 85/31 (03/13 0400) ?SpO2:  [87 %-99 %] 91 % (03/13 0745) ?Arterial Line BP: (90-109)/(40-49) 104/43 (03/13 0745) ?FiO2 (%):  [50 %-60 %] 55 % (03/13 0600) ?Weight:  [89.8 kg] 89.8 kg (03/13 0307) ? ?Hemodynamic parameters for last 24 hours: ?PAP: (41-55)/(20-29) 49/26 ?CVP:  [8 mmHg-19 mmHg] 12 mmHg ?CO:  [3.1 L/min-4.1 L/min] 3.9 L/min ?CI:  [1.6 L/min/m2-2.1 L/min/m2] 2 L/min/m2 ? ?Intake/Output from previous day: ?03/12 0701 - 03/13 0700 ?In: 4915.5 [I.V.:4025.6; NG/GT:390; IV Piggyback:499.9] ?Out: 6789  ?Intake/Output this shift: ?No intake/output data recorded. ? ?  ?   Exam ? ?  General- sedated on vent ?   Neck- no JVD, no cervical adenopathy palpable, no carotid bruit, swan sheath in place ?  Lungs- coarse breath sounds ?  Cor- regular rate and  paced rhythm, no murmur , gallop ?  Abdomen- soft, non-tender ?  Extremities - warm, non-tender, minimal edema ?  Neuro- sedated on iv fentanyl ? ?Lab Results: ?Recent Labs  ?  11/16/21 ?0330 11-Dec-2021 ?0310  ?WBC 31.5* 45.5*  ?HGB 9.1* 8.6*  ?HCT 31.7* 30.2*  ?PLT 208 215  ? ?BMET:  ?Recent Labs  ?  11/16/21 ?1612 12/11/2021 ?0310  ?NA 136 135  ?K 4.9 4.7  ?CL 103 100  ?CO2 22 24  ?GLUCOSE 180* 192*  ?BUN 18 16  ?CREATININE 0.77 0.70  ?CALCIUM 8.6* 8.5*  ?  ?PT/INR: No results for input(s): LABPROT, INR in the last 72 hours. ?ABG ?   ?Component Value Date/Time  ?  PHART 7.261 (L) 11/13/2021 1019  ? HCO3 23.7 11/13/2021 1019  ? TCO2 25 11/13/2021 1019  ? ACIDBASEDEF 4.0 (H) 11/13/2021 1019  ? O2SAT 44.9 2021/12/11 0310  ? ?CBG (last 3)  ?Recent Labs  ?  11/16/21 ?2313 December 11, 2021 ?3810 Dec 11, 2021 ?0734  ?GLUCAP 185* 189* 183*  ? ? ?Assessment/Plan: ?S/P Procedure(s) (LRB): ?TRICUSPID VALVE REPAIR USING EDWARDS MC3 TRICUSPID ANNULOPLASTY RING SIZE 28MM (N/A) ?TRANSESOPHAGEAL ECHOCARDIOGRAM (TEE) (N/A) ?Recurrent sepsis ?Will discuss central line change with team ?Blood cultures NG- cont Imepenum ? ? LOS: 21 days  ? ? ?[name] ?2021-12-11 ?  ?

## 2021-12-06 NOTE — Discharge Summary (Signed)
NAME: [name], [name]. ?MEDICAL RECORD NO: [id number] ?ACCOUNT NO: [id number] ?DATE OF BIRTH: [birth year] ?FACILITY: [facility] ?LOCATION: [facility] ?PHYSICIAN: Len Childs, MD ? ?Discharge Summary  ? ?DATE OF DISCHARGE: 16-Dec-2021 ? ?ADMITTING DIAGNOSES: ?1.  Gastrointestinal bleed with anemia requiring transfusion. ?2.  Associated acute kidney injury from hypotension and gastrointestinal bleeding. ?3.  History of chronic systolic heart failure from nonischemic cardiomyopathy related to earlier adriamycin  cardio-toxicity. ?4.  History of atrial arrhythmias, treated with Watchman device placement, 08/2021.  Unsuccessful attempt at EP ablation procedure. ?5.  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  Moderate to severe right ventricle dysfunction due to severe tricuspid regurgitation. ? ?PROCEDURES: ?1.  Left and right heart catheterization 02/22 by Dr. Haroldine Laws with disease and poor hemodynamics and cardiac output from RV dysfunction, low RV power index. ?2.  EGD and colonoscopy by Dr. Lyndel Safe on 02/24 showing right colon ischemic ulcers, probably the source for the lower GI bleeding from ischemic colitis. ?3.  Sternotomy and tricuspid repair with chordal reconstruction of the ruptured posterior leaflet and 28 mm MC3 ring annuloplasty on 02/28 by Dr. Prescott Gum. ?4.  Emergency reintubation on 03/02 postoperative day #2 for aspiration and acute hypoxia. ?5.  Initiation of CVVH for removal of fluid following postoperative acute on chronic renal failure ?6, terminal extubation and  discontinuation of permanent pacemaker on 12/16/21 following decision for withdrawal of care ? ? ?HOSPITAL COURSE:  The patient is 66 years old and has a long history of nonischemic cardiomyopathy, followed by the advanced heart failure service and has had multiple cardiology procedures over the past few years including  biventricular pacing for  ?resynchronization to Watchman placement into the left atrial appendage for history of atrial fibrillation.  The patient developed acute lower GI bleed with anemia and worsening of her heart failure with associated acute kidney injury and a left pleural effusion.  She was admitted through the ED.  She was stabilized and  ?treated with transfusion and required inotropic support directed by the heart failure team.  An echocardiogram showed persistent severe tricuspid valve regurgitation.  The right heart catheterization data showed subsequently severe RV dysfunction with poor hemodynamics.  It was felt that her presentation and cardiogenic shock ?and hypotension led to the ischemic colitis noted on subsequent colonoscopy.  This was felt to be the source of the lower GI bleed.  ? ? The patient was felt to be a high risk candidate for sternotomy and repair of tricuspid valve, but there was no other alternative.  The patient then was prepared for surgery with a right and left heart catheterization,  ?which showed normal coronaries, but the severe right heart derangements as noted.  Colonoscopy was performed, which showed the ischemic ulcer in the right colon.  The patient was treated with antibiotics, inotropic support and showed improvement in her  ?renal function and her abdominal symptoms and was able to resume and start a regular diet.  Her anemia remained stable and she needed no further transfusions and her cardiac output improved significantly as did her exercise tolerance without inotropic.  Prior to surgery she was able to walk in the hall on room air and her heart failure was optimized with IV milrinone, diuresis, and heart rhythm stabilization. ? ?  She underwent high risk tricuspid repair with annuloplasty ring.  The intraoperative TEE showed severe TR with moderate to severe RV dysfunction.  The  operative findings included a large ruptured chord to the posterior leaflet and this was  repaired as well as placement of a tricuspid annuloplasty ring. ?The post pump TEE of the tricuspid repair showed good result and minimal regurgitation.  She separated from cardiopulmonary bypass without difficulty with stable hemodynamics.  Her PA catheter was ?not able to be passed through the tricuspid ring  ?and around all of the pacing wires intraoperatively.   That was performed later under fluoroscopy in the ICU by  ?Dr. Haroldine Laws. ? ?On postoperative day #1, she was alert and responsive on the ventilator with a clear chest x-ray and stable hemodynamics.  Her renal function was stable and urine output was adequate and she was weaned and extubated from the ventilator.  She was in a paced rhythm.  On postoperative day  ?#2, she was stable and was mobilized up out of bed and started on a clear liquid diet.  Later that night on postoperative day two she  had an episode of emesis, aspirated, became hypoxic and required emergency reintubation and started on mechanical ventilation and continued her  ?postoperative inotropic support.  She did not arrest or require CPR.   ? ?She subsequently developed oliguria with bilateral significant airspace disease on x-ray and was started on CVVH to control her fluid balance and oxygenation.  Her sepsis markers subsequently improved as well as chest x-ray, but her renal  ?function did not improve and she was on continuous CVVH.  Echocardiogram showed the tricuspid valve remained well repaired with minimal regurgitation and overall, there was good biventricular function and good cardiac output by bedside monitoring.  The  ?patient remained fairly stable, but required further support on the ventilator for the next several days with vaginal improvement in her sepsis markers and improvement in her heart failure and chest x-ray.  She was not felt to be ready for extubation. ? ?After demonstrating this initial clinical improvement with antibiotics she developed abdominal distention,  worsening of her white count up to 40k  and decreasing blood pressure with vasodilatation from sepsis.  Her cardiac output remained normal to high. Antibiotics were continued and broadened for concern over ischemic bowel disease. ? ?Around postop day 14 she had severe metabolic acidosis with increased abdominal distention and  NG tube returned over a liter of bilious fluid.  KUB showed mildly dilated small bowel  ?loops.  She was taken to CT scan for further evaluation of probable mesenteric ischemia and this showed pneumatosis of a large collection of thickened ischemic bowel in her right lower quadrant consistent with ischemia/gangrenous bowel ? ?At this point, the patient was not a candidate for further surgery or bowel resection with multiorgan failure and family wished to withdraw support.  The patient was sedated heavily and her pacemaker was turned off and the patient was extubated and then  peacefully expired 7:00 p.m. on 03/13. ? ?FINAL DIAGNOSES: ?1.  Nonischemic cardiomyopathy, severe right ventricular dysfunction worsened by severe tricuspid regurgitation ?2.  History of atrial fibrillation. ?3.  Ischemic colitis with gastrointestinal bleeding and worsening heart failure on presentation.  The heart failure, GI bleeding, and signs of ischemic bowel disease all improved during her preparation for surgery. ? 4 postoperative aspiration pneumonia requiring reintubation postop day 2 ? 5 postoperative acute on chronic renal failure requiring CRRT ? 6 subsequent recurrence of her ischemic bowel disease, sepsis and death following tricuspid valve repair and ring annuloplasty ? ? ?NIK ?D: 11/20/2021 12:50:22 pm T: 11/20/2021 11:00:00 pm  ?JOB: [id number]/  [id number]  ?

## 2021-12-06 NOTE — Progress Notes (Addendum)
?  [facility] ? ? ?Reason for visit: Follow up on shock ? ?Interval History: now s/p new triple lumen placement to change lines; WBC up to 45.5, decreased fever today with Tmax < 100 overnight.   ?Day 3 vancomycin and meropenem ?Previously with 9 days antibiotics ? ?Physical Exam: ?Constitutional:  ?Vitals:  ? 12/10/2021 1015 10-Dec-2021 1030  ?BP:    ?Pulse: 80 80  ?Resp: (!) 32 20  ?Temp: 98.4 ?F (36.9 ?C) 98.2 ?F (36.8 ?C)  ?SpO2: 96% 94%  ?No response ?Respiratory: respiratory effort on vent ? ?Review of Systems: ?Unable to be assessed due to patient factors ? ?Lab Results  ?Component Value Date  ? WBC 45.5 (H) 12-10-2021  ? HGB 8.6 (L) 2021/12/10  ? HCT 30.2 (L) 12/10/2021  ? MCV 102.7 (H) December 10, 2021  ? PLT 215 2021-12-10  ?  ?Lab Results  ?Component Value Date  ? CREATININE 0.70 12-10-21  ? BUN 16 12/10/2021  ? NA 135 Dec 10, 2021  ? K 4.7 December 10, 2021  ? CL 100 Dec 10, 2021  ? CO2 24 12-10-21  ?  ?Lab Results  ?Component Value Date  ? ALT 188 (H) 2021-12-10  ? AST 984 (H) 2021/12/10  ? ALKPHOS 304 (H) 2021-12-10  ?  ? ?Microbiology: ?Recent Results (from the past 240 hour(s))  ?MRSA Next Gen by PCR, Nasal     Status: None  ? Collection Time: 11/15/21  4:37 PM  ? Specimen: Nasal Mucosa; Nasal Swab  ?Result Value Ref Range Status  ? MRSA by PCR Next Gen NOT DETECTED NOT DETECTED Final  ?  Comment: (NOTE) ?The GeneXpert MRSA Assay (FDA approved for NASAL specimens only), ?is one component of a comprehensive MRSA colonization surveillance ?program. It is not intended to diagnose MRSA infection nor to guide ?or monitor treatment for MRSA infections. ?Test performance is not FDA approved in patients less than 2 years ?old. ?Performed at [facility], [place] [address] [place], [place] ?[address] ?  ?Expectorated Sputum Assessment w Gram Stain, Rflx to Resp Cult     Status: None  ? Collection Time: 11/15/21  4:37 PM  ? Specimen: Expectorated Sputum  ?Result Value Ref Range Status  ?  Specimen Description EXPECTORATED SPUTUM  Final  ? Special Requests NONE  Final  ? Sputum evaluation   Final  ?  THIS SPECIMEN IS ACCEPTABLE FOR SPUTUM CULTURE ?Performed at [facility], [place] [address] [place], [place] [address] ?  ? Report Status 11/15/2021 FINAL  Final  ?Culture, Respiratory w Gram Stain     Status: None (Preliminary result)  ? Collection Time: 11/15/21  4:37 PM  ?Result Value Ref Range Status  ? Specimen Description EXPECTORATED SPUTUM  Final  ? Special Requests NONE Reflexed from [id number]  Final  ? Gram Stain   Final  ?  FEW WBC PRESENT,BOTH PMN AND MONONUCLEAR ?RARE SQUAMOUS EPITHELIAL CELLS PRESENT ?RARE YEAST ?  ? Culture   Final  ?  FEW YEAST ?IDENTIFICATION TO FOLLOW ?Performed at [facility], [place] [address], [place], [place] [address] ?  ? Report Status PENDING  Incomplete  ?Culture, blood (Routine X 2) w Reflex to ID Panel     Status: None (Preliminary result)  ? Collection Time: 11/16/21  8:27 AM  ? Specimen: BLOOD LEFT HAND  ?Result Value Ref Range Status  ? Specimen Description BLOOD LEFT HAND  Final  ? Special Requests   Final  ?  BOTTLES DRAWN AEROBIC AND ANAEROBIC Blood  Culture results may not be optimal due to an inadequate volume of blood received in culture bottles  ? Culture   Final  ?  NO GROWTH < 24 HOURS ?Performed at [facility], [place] [address] [place], [place] [address] ?  ? Report Status PENDING  Incomplete  ?Culture, blood (Routine X 2) w Reflex to ID Panel     Status: None (Preliminary result)  ? Collection Time: 11/16/21  8:43 AM  ? Specimen: BLOOD  ?Result Value Ref Range Status  ? Specimen Description BLOOD LEFT ANTECUBITAL  Final  ? Special Requests   Final  ?  BOTTLES DRAWN AEROBIC AND ANAEROBIC Blood Culture adequate volume  ? Culture   Final  ?  NO GROWTH < 24 HOURS ?Performed at [facility], [place] [address], [place], [place] [address] ?  ? Report Status PENDING  Incomplete  ? ? ?Impression/Plan:  ?Leukocytosis - this keeps increasing  and no infection identified with no positive blood cultures with last set sent yesterday.  Respiratory cultures unrevealing with just some yeast which is not significant.  She is on vancomycin and meropenem empirically with nothing to target and will continue to monitor, no changes in antibiotics at this time. No signs or concerns for fungal infection at this time.  Lines have been changed.   ?Ileus - she does have some stool output of about 400 cc over the last 24 hours but xray concerning for ileus and suspicion of ischemic bowel.  CT scan ordered.  I don't suspect C diff at this time with relatively minimal stool output despite the leukocytosis which is likely reactive due to the ileus.   ?Acute respiratory failure - remains intubated and critically ill.   ?Transaminitis - elevated AST, ALT and T bili, alk phos.  May be some element of shock liver but something like acute cholangitis possible.  Getting a CT scan today and can look for dilitation.    I will go ahead and order an abdominal ultrasound which I believe can be done at the bedside sooner to facilitate.   ? ?I have personally spent 50 minutes involved in face-to-face and non-face-to-face activities for this patient on the day of the visit with an additional 15 minutes in review of the findings and history. Professional time spent includes the following activities: Preparing to see the patient (review of tests), Obtaining and/or reviewing separately obtained history (admission/discharge record), Performing a medically appropriate examination and/or evaluation , Ordering medications/tests/procedures, referring and communicating with other health care professionals, Documenting clinical information in the EMR, Independently interpreting results (not separately reported), Communicating results to the patient/family/caregiver, Counseling and educating the patient/family/caregiver and Care coordination (not separately reported).   ? ? ?  ?

## 2021-12-06 DEATH — deceased

## 2022-02-19 ENCOUNTER — Telehealth: Payer: Medicare PPO | Admitting: Cardiology

## 2022-03-30 IMAGING — DX DG ABD PORTABLE 1V
1 series · 1 of 1 positions shown · non-contrast
Comparison: Abdominal radiograph dated November 08, 2021

CLINICAL DATA: NG tube placement, line placement.

EXAM:
PORTABLE ABDOMEN - 1 VIEW

[abdomen supine]
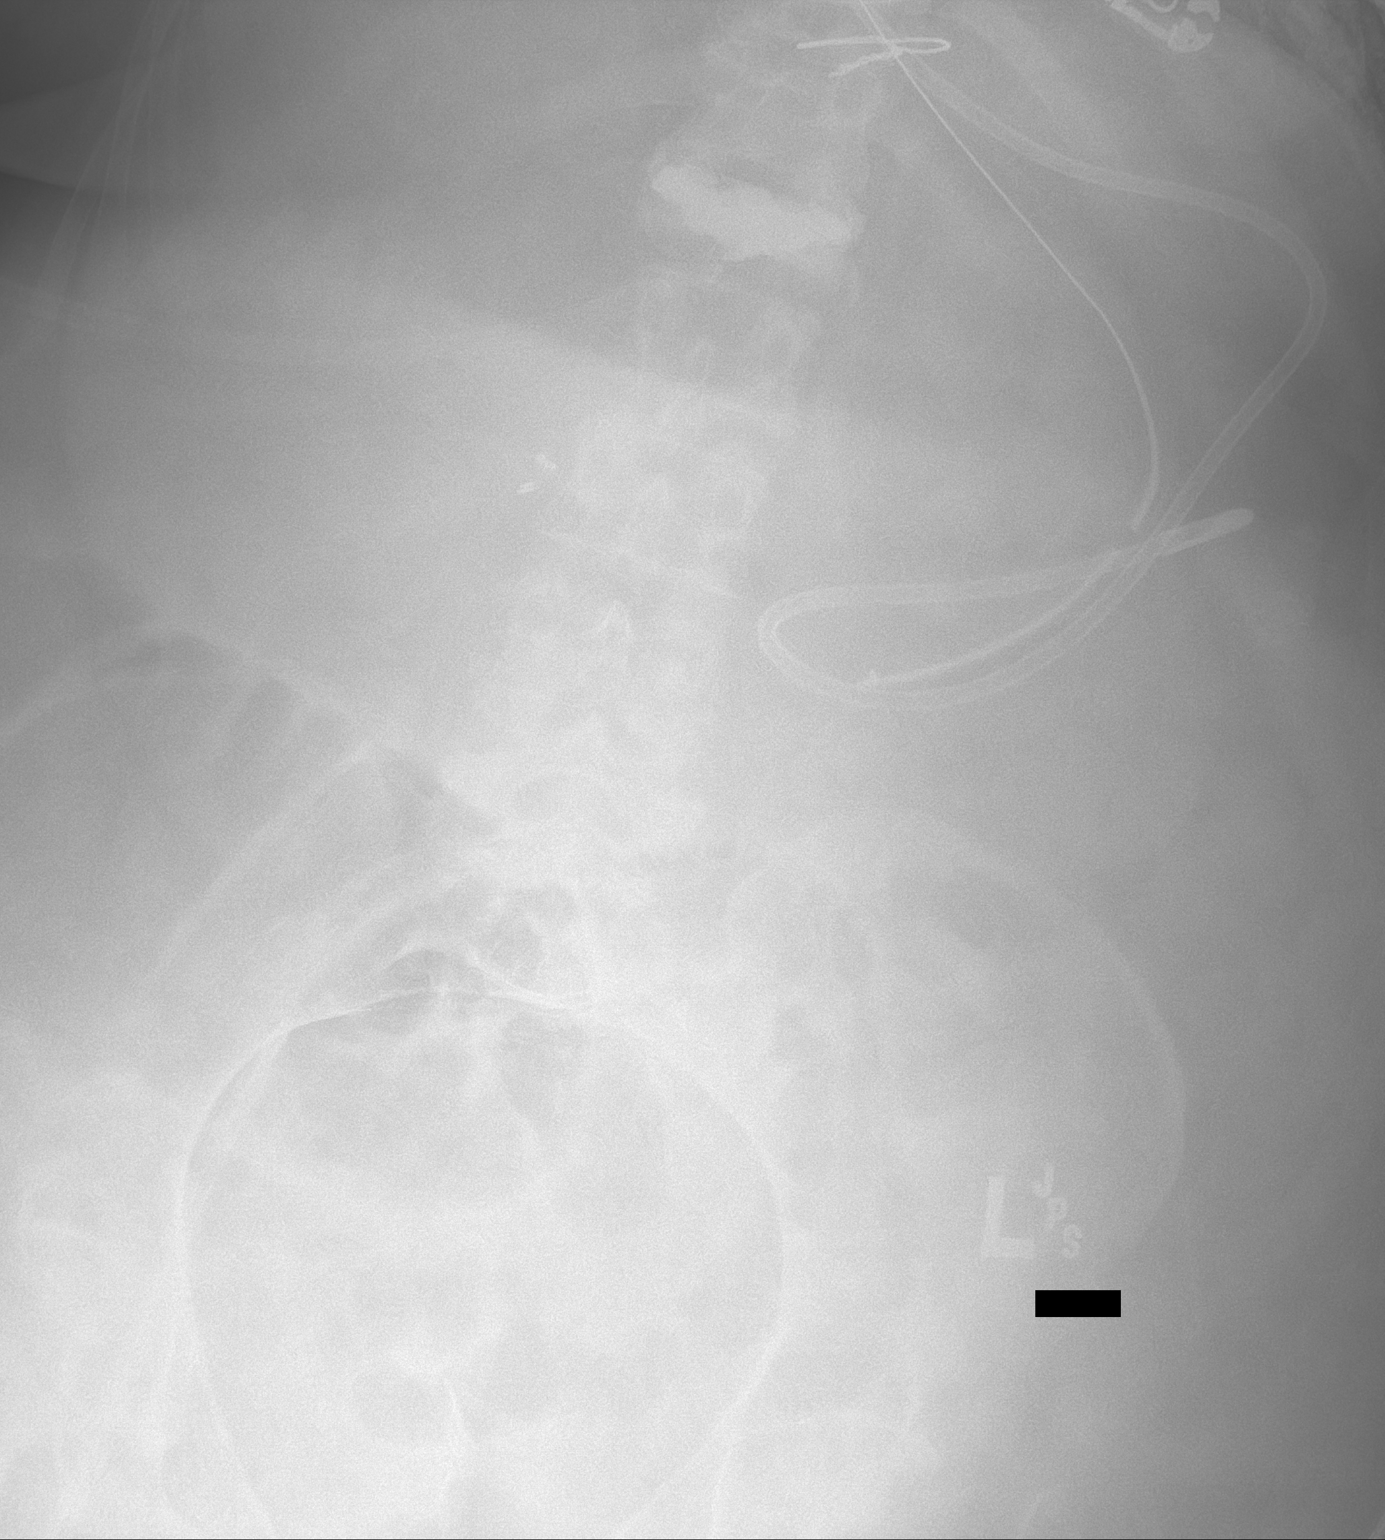

[1 of 1 positions shown; findings below may reference images not displayed]

FINDINGS: Enteric tube with distal tip and side port projecting over the
stomach which is distended. There is also a weighted tip feeding
tube with distal tip in the body of the stomach. Marked gaseous
distention of the bowel loops. Vertebroplasty of the L1 vertebral
body.
IMPRESSION: 1. NG tube and weighted tip feeding tube terminating in the body of
the stomach.

2. Marked gaseous distention of the bowel loops, concerning for
ileus or obstruction.
